# Patient Record
Sex: Male | Born: 1975 | Race: White | Hispanic: No | Smoking: Current every day smoker
Health system: Southern US, Community
[De-identification: ages and names within clinical notes are randomized; demographics above are authoritative.]

## PROBLEM LIST (undated history)

## (undated) DIAGNOSIS — F1910 Other psychoactive substance abuse, uncomplicated: Secondary | ICD-10-CM

## (undated) DIAGNOSIS — M1990 Unspecified osteoarthritis, unspecified site: Secondary | ICD-10-CM

## (undated) DIAGNOSIS — F319 Bipolar disorder, unspecified: Secondary | ICD-10-CM

## (undated) DIAGNOSIS — G894 Chronic pain syndrome: Secondary | ICD-10-CM

## (undated) DIAGNOSIS — J189 Pneumonia, unspecified organism: Secondary | ICD-10-CM

## (undated) DIAGNOSIS — T7840XA Allergy, unspecified, initial encounter: Secondary | ICD-10-CM

## (undated) DIAGNOSIS — I10 Essential (primary) hypertension: Secondary | ICD-10-CM

## (undated) DIAGNOSIS — IMO0002 Reserved for concepts with insufficient information to code with codable children: Secondary | ICD-10-CM

## (undated) DIAGNOSIS — F32A Depression, unspecified: Secondary | ICD-10-CM

## (undated) DIAGNOSIS — E785 Hyperlipidemia, unspecified: Secondary | ICD-10-CM

## (undated) DIAGNOSIS — F1120 Opioid dependence, uncomplicated: Secondary | ICD-10-CM

## (undated) DIAGNOSIS — Z765 Malingerer [conscious simulation]: Secondary | ICD-10-CM

## (undated) DIAGNOSIS — F419 Anxiety disorder, unspecified: Secondary | ICD-10-CM

## (undated) DIAGNOSIS — K219 Gastro-esophageal reflux disease without esophagitis: Secondary | ICD-10-CM

## (undated) DIAGNOSIS — F329 Major depressive disorder, single episode, unspecified: Secondary | ICD-10-CM

## (undated) DIAGNOSIS — M542 Cervicalgia: Secondary | ICD-10-CM

## (undated) DIAGNOSIS — G8929 Other chronic pain: Secondary | ICD-10-CM

## (undated) HISTORY — DX: Allergy, unspecified, initial encounter: T78.40XA

## (undated) HISTORY — DX: Reserved for concepts with insufficient information to code with codable children: IMO0002

## (undated) HISTORY — DX: Hyperlipidemia, unspecified: E78.5

## (undated) HISTORY — DX: Major depressive disorder, single episode, unspecified: F32.9

## (undated) HISTORY — DX: Chronic pain syndrome: G89.4

## (undated) HISTORY — DX: Depression, unspecified: F32.A

## (undated) HISTORY — DX: Essential (primary) hypertension: I10

## (undated) HISTORY — DX: Gastro-esophageal reflux disease without esophagitis: K21.9

---

## 1998-03-15 ENCOUNTER — Encounter: Admission: RE | Admit: 1998-03-15 | Discharge: 1998-03-15 | Payer: Self-pay | Admitting: Family Medicine

## 1998-03-16 ENCOUNTER — Encounter: Admission: RE | Admit: 1998-03-16 | Discharge: 1998-03-16 | Payer: Self-pay | Admitting: Family Medicine

## 1998-03-26 ENCOUNTER — Encounter: Admission: RE | Admit: 1998-03-26 | Discharge: 1998-03-26 | Payer: Self-pay | Admitting: Family Medicine

## 1998-05-27 ENCOUNTER — Emergency Department (HOSPITAL_COMMUNITY): Admission: EM | Admit: 1998-05-27 | Discharge: 1998-05-27 | Payer: Self-pay | Admitting: Emergency Medicine

## 1998-07-03 ENCOUNTER — Emergency Department (HOSPITAL_COMMUNITY): Admission: EM | Admit: 1998-07-03 | Discharge: 1998-07-03 | Payer: Self-pay | Admitting: Emergency Medicine

## 1998-08-28 ENCOUNTER — Emergency Department (HOSPITAL_COMMUNITY): Admission: EM | Admit: 1998-08-28 | Discharge: 1998-08-28 | Payer: Self-pay | Admitting: Emergency Medicine

## 1999-05-21 ENCOUNTER — Encounter: Payer: Self-pay | Admitting: Emergency Medicine

## 1999-05-21 ENCOUNTER — Emergency Department (HOSPITAL_COMMUNITY): Admission: EM | Admit: 1999-05-21 | Discharge: 1999-05-21 | Payer: Self-pay | Admitting: Emergency Medicine

## 1999-07-12 ENCOUNTER — Encounter: Admission: RE | Admit: 1999-07-12 | Discharge: 1999-07-12 | Payer: Self-pay | Admitting: Family Medicine

## 1999-09-09 ENCOUNTER — Encounter: Admission: RE | Admit: 1999-09-09 | Discharge: 1999-09-09 | Payer: Self-pay | Admitting: Family Medicine

## 1999-10-18 ENCOUNTER — Encounter: Admission: RE | Admit: 1999-10-18 | Discharge: 1999-10-18 | Payer: Self-pay | Admitting: Family Medicine

## 2000-05-15 ENCOUNTER — Encounter: Admission: RE | Admit: 2000-05-15 | Discharge: 2000-05-15 | Payer: Self-pay | Admitting: Family Medicine

## 2000-11-10 HISTORY — PX: HERNIA REPAIR: SHX51

## 2000-11-10 HISTORY — PX: APPENDECTOMY: SHX54

## 2001-03-08 ENCOUNTER — Encounter: Admission: RE | Admit: 2001-03-08 | Discharge: 2001-03-08 | Payer: Self-pay | Admitting: Family Medicine

## 2001-04-09 ENCOUNTER — Encounter: Admission: RE | Admit: 2001-04-09 | Discharge: 2001-04-09 | Payer: Self-pay | Admitting: Family Medicine

## 2001-06-07 ENCOUNTER — Encounter: Admission: RE | Admit: 2001-06-07 | Discharge: 2001-06-07 | Payer: Self-pay | Admitting: Family Medicine

## 2001-12-21 ENCOUNTER — Emergency Department (HOSPITAL_COMMUNITY): Admission: EM | Admit: 2001-12-21 | Discharge: 2001-12-21 | Payer: Self-pay | Admitting: Emergency Medicine

## 2001-12-22 ENCOUNTER — Encounter: Admission: RE | Admit: 2001-12-22 | Discharge: 2001-12-22 | Payer: Self-pay | Admitting: Family Medicine

## 2002-04-11 ENCOUNTER — Encounter: Admission: RE | Admit: 2002-04-11 | Discharge: 2002-04-11 | Payer: Self-pay | Admitting: *Deleted

## 2002-04-11 ENCOUNTER — Encounter: Admission: RE | Admit: 2002-04-11 | Discharge: 2002-04-11 | Payer: Self-pay | Admitting: Family Medicine

## 2002-04-11 ENCOUNTER — Encounter: Payer: Self-pay | Admitting: *Deleted

## 2002-04-12 ENCOUNTER — Emergency Department (HOSPITAL_COMMUNITY): Admission: EM | Admit: 2002-04-12 | Discharge: 2002-04-12 | Payer: Self-pay | Admitting: Emergency Medicine

## 2002-08-04 ENCOUNTER — Emergency Department (HOSPITAL_COMMUNITY): Admission: EM | Admit: 2002-08-04 | Discharge: 2002-08-04 | Payer: Self-pay | Admitting: Internal Medicine

## 2002-12-15 ENCOUNTER — Emergency Department (HOSPITAL_COMMUNITY): Admission: EM | Admit: 2002-12-15 | Discharge: 2002-12-15 | Payer: Self-pay | Admitting: Emergency Medicine

## 2002-12-25 ENCOUNTER — Emergency Department (HOSPITAL_COMMUNITY): Admission: EM | Admit: 2002-12-25 | Discharge: 2002-12-25 | Payer: Self-pay | Admitting: *Deleted

## 2003-02-09 ENCOUNTER — Encounter: Admission: RE | Admit: 2003-02-09 | Discharge: 2003-02-09 | Payer: Self-pay | Admitting: Family Medicine

## 2003-02-20 ENCOUNTER — Encounter: Admission: RE | Admit: 2003-02-20 | Discharge: 2003-02-20 | Payer: Self-pay | Admitting: Family Medicine

## 2003-03-21 ENCOUNTER — Encounter: Admission: RE | Admit: 2003-03-21 | Discharge: 2003-03-21 | Payer: Self-pay | Admitting: Sports Medicine

## 2003-03-31 ENCOUNTER — Encounter: Admission: RE | Admit: 2003-03-31 | Discharge: 2003-03-31 | Payer: Self-pay | Admitting: Family Medicine

## 2003-04-14 ENCOUNTER — Encounter: Admission: RE | Admit: 2003-04-14 | Discharge: 2003-04-14 | Payer: Self-pay | Admitting: Family Medicine

## 2003-10-09 ENCOUNTER — Emergency Department (HOSPITAL_COMMUNITY): Admission: EM | Admit: 2003-10-09 | Discharge: 2003-10-09 | Payer: Self-pay | Admitting: Emergency Medicine

## 2003-10-12 ENCOUNTER — Encounter: Admission: RE | Admit: 2003-10-12 | Discharge: 2003-10-12 | Payer: Self-pay | Admitting: Sports Medicine

## 2003-12-23 ENCOUNTER — Emergency Department (HOSPITAL_COMMUNITY): Admission: EM | Admit: 2003-12-23 | Discharge: 2003-12-23 | Payer: Self-pay | Admitting: Emergency Medicine

## 2003-12-25 ENCOUNTER — Encounter: Admission: RE | Admit: 2003-12-25 | Discharge: 2003-12-25 | Payer: Self-pay | Admitting: Family Medicine

## 2004-01-06 ENCOUNTER — Emergency Department (HOSPITAL_COMMUNITY): Admission: EM | Admit: 2004-01-06 | Discharge: 2004-01-06 | Payer: Self-pay | Admitting: Emergency Medicine

## 2004-01-08 ENCOUNTER — Encounter: Admission: RE | Admit: 2004-01-08 | Discharge: 2004-01-08 | Payer: Self-pay | Admitting: Family Medicine

## 2004-01-12 ENCOUNTER — Encounter: Admission: RE | Admit: 2004-01-12 | Discharge: 2004-01-12 | Payer: Self-pay | Admitting: Family Medicine

## 2004-01-14 ENCOUNTER — Emergency Department (HOSPITAL_COMMUNITY): Admission: EM | Admit: 2004-01-14 | Discharge: 2004-01-14 | Payer: Self-pay | Admitting: Emergency Medicine

## 2004-01-25 ENCOUNTER — Encounter (INDEPENDENT_AMBULATORY_CARE_PROVIDER_SITE_OTHER): Payer: Self-pay | Admitting: Specialist

## 2004-01-26 ENCOUNTER — Inpatient Hospital Stay (HOSPITAL_COMMUNITY): Admission: RE | Admit: 2004-01-26 | Discharge: 2004-01-27 | Payer: Self-pay | Admitting: Surgery

## 2004-01-29 ENCOUNTER — Encounter: Admission: RE | Admit: 2004-01-29 | Discharge: 2004-01-29 | Payer: Self-pay | Admitting: Family Medicine

## 2004-03-10 ENCOUNTER — Emergency Department (HOSPITAL_COMMUNITY): Admission: EM | Admit: 2004-03-10 | Discharge: 2004-03-10 | Payer: Self-pay | Admitting: Emergency Medicine

## 2004-03-14 ENCOUNTER — Encounter: Admission: RE | Admit: 2004-03-14 | Discharge: 2004-03-14 | Payer: Self-pay | Admitting: Family Medicine

## 2004-03-14 ENCOUNTER — Encounter: Admission: RE | Admit: 2004-03-14 | Discharge: 2004-03-14 | Payer: Self-pay | Admitting: Sports Medicine

## 2004-03-15 ENCOUNTER — Encounter: Admission: RE | Admit: 2004-03-15 | Discharge: 2004-03-15 | Payer: Self-pay | Admitting: Family Medicine

## 2004-05-01 ENCOUNTER — Emergency Department (HOSPITAL_COMMUNITY): Admission: EM | Admit: 2004-05-01 | Discharge: 2004-05-02 | Payer: Self-pay | Admitting: *Deleted

## 2004-05-20 ENCOUNTER — Encounter (HOSPITAL_COMMUNITY): Admission: RE | Admit: 2004-05-20 | Discharge: 2004-06-19 | Payer: Self-pay | Admitting: Orthopedic Surgery

## 2004-07-03 ENCOUNTER — Ambulatory Visit (HOSPITAL_COMMUNITY): Admission: RE | Admit: 2004-07-03 | Discharge: 2004-07-03 | Payer: Self-pay | Admitting: Orthopedic Surgery

## 2004-07-19 ENCOUNTER — Ambulatory Visit: Payer: Self-pay | Admitting: Family Medicine

## 2004-07-26 ENCOUNTER — Ambulatory Visit: Payer: Self-pay | Admitting: Family Medicine

## 2004-07-30 ENCOUNTER — Encounter: Admission: RE | Admit: 2004-07-30 | Discharge: 2004-10-28 | Payer: Self-pay | Admitting: Family Medicine

## 2004-08-02 ENCOUNTER — Ambulatory Visit: Payer: Self-pay | Admitting: Family Medicine

## 2004-08-05 ENCOUNTER — Encounter: Admission: RE | Admit: 2004-08-05 | Discharge: 2004-08-05 | Payer: Self-pay | Admitting: Family Medicine

## 2004-08-09 ENCOUNTER — Ambulatory Visit: Payer: Self-pay | Admitting: Family Medicine

## 2004-08-19 ENCOUNTER — Ambulatory Visit: Payer: Self-pay | Admitting: Family Medicine

## 2004-09-09 ENCOUNTER — Ambulatory Visit: Payer: Self-pay | Admitting: Family Medicine

## 2004-10-14 ENCOUNTER — Ambulatory Visit: Payer: Self-pay | Admitting: Family Medicine

## 2004-10-21 ENCOUNTER — Ambulatory Visit: Payer: Self-pay | Admitting: Family Medicine

## 2004-11-10 HISTORY — PX: CARDIAC CATHETERIZATION: SHX172

## 2004-12-06 ENCOUNTER — Emergency Department (HOSPITAL_COMMUNITY): Admission: EM | Admit: 2004-12-06 | Discharge: 2004-12-06 | Payer: Self-pay | Admitting: Emergency Medicine

## 2005-01-05 ENCOUNTER — Observation Stay (HOSPITAL_COMMUNITY): Admission: EM | Admit: 2005-01-05 | Discharge: 2005-01-06 | Payer: Self-pay | Admitting: Emergency Medicine

## 2005-01-06 ENCOUNTER — Encounter (INDEPENDENT_AMBULATORY_CARE_PROVIDER_SITE_OTHER): Payer: Self-pay | Admitting: Family Medicine

## 2005-01-06 ENCOUNTER — Ambulatory Visit: Payer: Self-pay | Admitting: Family Medicine

## 2005-01-08 ENCOUNTER — Emergency Department (HOSPITAL_COMMUNITY): Admission: EM | Admit: 2005-01-08 | Discharge: 2005-01-09 | Payer: Self-pay | Admitting: *Deleted

## 2005-01-31 ENCOUNTER — Ambulatory Visit (HOSPITAL_COMMUNITY): Admission: RE | Admit: 2005-01-31 | Discharge: 2005-01-31 | Payer: Self-pay | Admitting: Family Medicine

## 2005-02-04 ENCOUNTER — Encounter (HOSPITAL_COMMUNITY): Admission: RE | Admit: 2005-02-04 | Discharge: 2005-03-06 | Payer: Self-pay

## 2005-02-04 ENCOUNTER — Ambulatory Visit: Payer: Self-pay | Admitting: Internal Medicine

## 2005-02-15 ENCOUNTER — Emergency Department (HOSPITAL_COMMUNITY): Admission: EM | Admit: 2005-02-15 | Discharge: 2005-02-15 | Payer: Self-pay | Admitting: Emergency Medicine

## 2005-02-16 ENCOUNTER — Inpatient Hospital Stay (HOSPITAL_COMMUNITY): Admission: AD | Admit: 2005-02-16 | Discharge: 2005-02-23 | Payer: Self-pay | Admitting: Emergency Medicine

## 2005-02-16 ENCOUNTER — Ambulatory Visit: Payer: Self-pay | Admitting: Physical Medicine & Rehabilitation

## 2005-02-19 ENCOUNTER — Encounter: Payer: Self-pay | Admitting: Internal Medicine

## 2005-03-11 ENCOUNTER — Encounter (HOSPITAL_COMMUNITY): Admission: RE | Admit: 2005-03-11 | Discharge: 2005-04-10 | Payer: Self-pay | Admitting: *Deleted

## 2005-04-14 ENCOUNTER — Ambulatory Visit: Payer: Self-pay | Admitting: Internal Medicine

## 2005-04-30 ENCOUNTER — Encounter: Admission: RE | Admit: 2005-04-30 | Discharge: 2005-04-30 | Payer: Self-pay | Admitting: Orthopedic Surgery

## 2005-05-20 ENCOUNTER — Encounter: Admission: RE | Admit: 2005-05-20 | Discharge: 2005-05-20 | Payer: Self-pay | Admitting: Orthopedic Surgery

## 2005-05-25 ENCOUNTER — Ambulatory Visit: Admission: RE | Admit: 2005-05-25 | Discharge: 2005-05-25 | Payer: Self-pay | Admitting: Family Medicine

## 2005-05-31 ENCOUNTER — Emergency Department (HOSPITAL_COMMUNITY): Admission: EM | Admit: 2005-05-31 | Discharge: 2005-05-31 | Payer: Self-pay | Admitting: Emergency Medicine

## 2005-06-02 ENCOUNTER — Ambulatory Visit: Payer: Self-pay | Admitting: Pulmonary Disease

## 2005-06-23 ENCOUNTER — Ambulatory Visit: Payer: Self-pay | Admitting: Internal Medicine

## 2005-07-01 ENCOUNTER — Ambulatory Visit (HOSPITAL_COMMUNITY): Admission: RE | Admit: 2005-07-01 | Discharge: 2005-07-01 | Payer: Self-pay | Admitting: Internal Medicine

## 2005-08-14 ENCOUNTER — Ambulatory Visit: Payer: Self-pay | Admitting: Internal Medicine

## 2005-09-22 ENCOUNTER — Ambulatory Visit: Payer: Self-pay | Admitting: Internal Medicine

## 2005-10-21 ENCOUNTER — Ambulatory Visit: Payer: Self-pay | Admitting: Family Medicine

## 2005-11-18 ENCOUNTER — Ambulatory Visit (HOSPITAL_COMMUNITY): Admission: RE | Admit: 2005-11-18 | Discharge: 2005-11-18 | Payer: Self-pay | Admitting: Family Medicine

## 2005-11-18 ENCOUNTER — Ambulatory Visit: Payer: Self-pay | Admitting: Family Medicine

## 2005-12-23 ENCOUNTER — Encounter (INDEPENDENT_AMBULATORY_CARE_PROVIDER_SITE_OTHER): Payer: Self-pay | Admitting: Family Medicine

## 2005-12-26 ENCOUNTER — Ambulatory Visit: Payer: Self-pay | Admitting: Family Medicine

## 2005-12-26 ENCOUNTER — Ambulatory Visit (HOSPITAL_COMMUNITY): Admission: RE | Admit: 2005-12-26 | Discharge: 2005-12-26 | Payer: Self-pay | Admitting: Family Medicine

## 2006-01-19 ENCOUNTER — Ambulatory Visit: Payer: Self-pay | Admitting: Family Medicine

## 2006-02-17 ENCOUNTER — Ambulatory Visit: Payer: Self-pay | Admitting: Family Medicine

## 2006-02-18 ENCOUNTER — Encounter (INDEPENDENT_AMBULATORY_CARE_PROVIDER_SITE_OTHER): Payer: Self-pay | Admitting: Family Medicine

## 2006-02-18 LAB — CONVERTED CEMR LAB
Blood Glucose, Fasting: 100 mg/dL
RBC count: 4.35 10*6/uL

## 2006-03-03 ENCOUNTER — Ambulatory Visit: Payer: Self-pay | Admitting: Family Medicine

## 2006-03-11 ENCOUNTER — Emergency Department (HOSPITAL_COMMUNITY): Admission: EM | Admit: 2006-03-11 | Discharge: 2006-03-11 | Payer: Self-pay | Admitting: Emergency Medicine

## 2006-04-07 ENCOUNTER — Ambulatory Visit: Payer: Self-pay | Admitting: Family Medicine

## 2006-05-04 ENCOUNTER — Encounter (INDEPENDENT_AMBULATORY_CARE_PROVIDER_SITE_OTHER): Payer: Self-pay | Admitting: Family Medicine

## 2006-05-05 ENCOUNTER — Ambulatory Visit: Payer: Self-pay | Admitting: Family Medicine

## 2006-05-07 ENCOUNTER — Ambulatory Visit: Payer: Self-pay | Admitting: Internal Medicine

## 2006-05-25 ENCOUNTER — Ambulatory Visit: Payer: Self-pay | Admitting: Family Medicine

## 2006-05-26 ENCOUNTER — Ambulatory Visit (HOSPITAL_COMMUNITY): Admission: RE | Admit: 2006-05-26 | Discharge: 2006-05-26 | Payer: Self-pay | Admitting: Family Medicine

## 2006-06-09 ENCOUNTER — Ambulatory Visit: Payer: Self-pay | Admitting: Family Medicine

## 2006-06-25 ENCOUNTER — Ambulatory Visit: Payer: Self-pay | Admitting: Family Medicine

## 2006-07-24 ENCOUNTER — Ambulatory Visit: Payer: Self-pay | Admitting: Family Medicine

## 2006-08-04 ENCOUNTER — Encounter (HOSPITAL_COMMUNITY): Admission: RE | Admit: 2006-08-04 | Discharge: 2006-08-08 | Payer: Self-pay | Admitting: Family Medicine

## 2006-08-18 ENCOUNTER — Ambulatory Visit: Payer: Self-pay | Admitting: Family Medicine

## 2006-08-18 ENCOUNTER — Ambulatory Visit (HOSPITAL_COMMUNITY): Admission: RE | Admit: 2006-08-18 | Discharge: 2006-08-18 | Payer: Self-pay | Admitting: Otolaryngology

## 2006-08-25 ENCOUNTER — Encounter (HOSPITAL_COMMUNITY): Admission: RE | Admit: 2006-08-25 | Discharge: 2006-09-24 | Payer: Self-pay | Admitting: Family Medicine

## 2006-08-26 ENCOUNTER — Emergency Department (HOSPITAL_COMMUNITY): Admission: EM | Admit: 2006-08-26 | Discharge: 2006-08-26 | Payer: Self-pay | Admitting: Emergency Medicine

## 2006-09-25 ENCOUNTER — Ambulatory Visit: Payer: Self-pay | Admitting: Family Medicine

## 2006-09-30 ENCOUNTER — Encounter: Payer: Self-pay | Admitting: Family Medicine

## 2006-09-30 DIAGNOSIS — J309 Allergic rhinitis, unspecified: Secondary | ICD-10-CM | POA: Insufficient documentation

## 2006-09-30 DIAGNOSIS — K219 Gastro-esophageal reflux disease without esophagitis: Secondary | ICD-10-CM | POA: Insufficient documentation

## 2006-09-30 DIAGNOSIS — F17200 Nicotine dependence, unspecified, uncomplicated: Secondary | ICD-10-CM | POA: Insufficient documentation

## 2006-09-30 DIAGNOSIS — IMO0001 Reserved for inherently not codable concepts without codable children: Secondary | ICD-10-CM | POA: Insufficient documentation

## 2006-09-30 DIAGNOSIS — E785 Hyperlipidemia, unspecified: Secondary | ICD-10-CM | POA: Insufficient documentation

## 2006-09-30 DIAGNOSIS — M549 Dorsalgia, unspecified: Secondary | ICD-10-CM | POA: Insufficient documentation

## 2006-09-30 DIAGNOSIS — I10 Essential (primary) hypertension: Secondary | ICD-10-CM | POA: Insufficient documentation

## 2006-09-30 DIAGNOSIS — E1165 Type 2 diabetes mellitus with hyperglycemia: Secondary | ICD-10-CM

## 2006-09-30 DIAGNOSIS — M5137 Other intervertebral disc degeneration, lumbosacral region: Secondary | ICD-10-CM | POA: Insufficient documentation

## 2006-09-30 DIAGNOSIS — Z8719 Personal history of other diseases of the digestive system: Secondary | ICD-10-CM | POA: Insufficient documentation

## 2006-09-30 DIAGNOSIS — IMO0002 Reserved for concepts with insufficient information to code with codable children: Secondary | ICD-10-CM | POA: Insufficient documentation

## 2006-09-30 DIAGNOSIS — J449 Chronic obstructive pulmonary disease, unspecified: Secondary | ICD-10-CM | POA: Insufficient documentation

## 2006-10-16 ENCOUNTER — Ambulatory Visit: Payer: Self-pay | Admitting: Family Medicine

## 2006-10-21 ENCOUNTER — Ambulatory Visit: Payer: Self-pay | Admitting: Pulmonary Disease

## 2006-10-31 ENCOUNTER — Emergency Department (HOSPITAL_COMMUNITY): Admission: EM | Admit: 2006-10-31 | Discharge: 2006-10-31 | Payer: Self-pay | Admitting: Emergency Medicine

## 2006-11-10 HISTORY — PX: NASAL SINUS SURGERY: SHX719

## 2006-11-20 ENCOUNTER — Ambulatory Visit: Payer: Self-pay | Admitting: Pulmonary Disease

## 2006-11-27 ENCOUNTER — Ambulatory Visit: Payer: Self-pay | Admitting: Family Medicine

## 2006-11-27 ENCOUNTER — Ambulatory Visit (HOSPITAL_COMMUNITY): Admission: RE | Admit: 2006-11-27 | Discharge: 2006-11-27 | Payer: Self-pay | Admitting: Family Medicine

## 2006-12-11 ENCOUNTER — Ambulatory Visit: Payer: Self-pay | Admitting: Family Medicine

## 2006-12-18 ENCOUNTER — Encounter (INDEPENDENT_AMBULATORY_CARE_PROVIDER_SITE_OTHER): Payer: Self-pay | Admitting: Family Medicine

## 2006-12-21 ENCOUNTER — Telehealth (INDEPENDENT_AMBULATORY_CARE_PROVIDER_SITE_OTHER): Payer: Self-pay | Admitting: Family Medicine

## 2006-12-22 ENCOUNTER — Ambulatory Visit: Payer: Self-pay | Admitting: Family Medicine

## 2006-12-23 ENCOUNTER — Encounter (INDEPENDENT_AMBULATORY_CARE_PROVIDER_SITE_OTHER): Payer: Self-pay | Admitting: Family Medicine

## 2006-12-25 ENCOUNTER — Encounter (INDEPENDENT_AMBULATORY_CARE_PROVIDER_SITE_OTHER): Payer: Self-pay | Admitting: Family Medicine

## 2007-01-07 ENCOUNTER — Ambulatory Visit: Payer: Self-pay | Admitting: Family Medicine

## 2007-01-07 DIAGNOSIS — F1910 Other psychoactive substance abuse, uncomplicated: Secondary | ICD-10-CM | POA: Insufficient documentation

## 2007-01-07 LAB — CONVERTED CEMR LAB
Cholesterol, target level: 200 mg/dL
HDL goal, serum: 40 mg/dL

## 2007-01-08 ENCOUNTER — Encounter (INDEPENDENT_AMBULATORY_CARE_PROVIDER_SITE_OTHER): Payer: Self-pay | Admitting: Family Medicine

## 2007-01-08 ENCOUNTER — Telehealth (INDEPENDENT_AMBULATORY_CARE_PROVIDER_SITE_OTHER): Payer: Self-pay | Admitting: Family Medicine

## 2007-01-11 ENCOUNTER — Encounter (INDEPENDENT_AMBULATORY_CARE_PROVIDER_SITE_OTHER): Payer: Self-pay | Admitting: Family Medicine

## 2007-01-11 LAB — CONVERTED CEMR LAB
Amphetamine Screen, Ur: NEGATIVE
Creatinine,U: 142.4 mg/dL
Marijuana Metabolite: NEGATIVE
Methadone: NEGATIVE

## 2007-01-12 ENCOUNTER — Telehealth (INDEPENDENT_AMBULATORY_CARE_PROVIDER_SITE_OTHER): Payer: Self-pay | Admitting: Family Medicine

## 2007-01-20 ENCOUNTER — Encounter (INDEPENDENT_AMBULATORY_CARE_PROVIDER_SITE_OTHER): Payer: Self-pay | Admitting: Family Medicine

## 2007-01-26 ENCOUNTER — Telehealth (INDEPENDENT_AMBULATORY_CARE_PROVIDER_SITE_OTHER): Payer: Self-pay | Admitting: Family Medicine

## 2007-01-28 ENCOUNTER — Encounter (INDEPENDENT_AMBULATORY_CARE_PROVIDER_SITE_OTHER): Payer: Self-pay | Admitting: Family Medicine

## 2007-02-05 ENCOUNTER — Telehealth (INDEPENDENT_AMBULATORY_CARE_PROVIDER_SITE_OTHER): Payer: Self-pay | Admitting: Family Medicine

## 2007-02-08 ENCOUNTER — Encounter (INDEPENDENT_AMBULATORY_CARE_PROVIDER_SITE_OTHER): Payer: Self-pay | Admitting: Family Medicine

## 2007-02-09 ENCOUNTER — Telehealth (INDEPENDENT_AMBULATORY_CARE_PROVIDER_SITE_OTHER): Payer: Self-pay | Admitting: Family Medicine

## 2007-02-10 ENCOUNTER — Ambulatory Visit: Payer: Self-pay | Admitting: Family Medicine

## 2007-02-12 ENCOUNTER — Encounter (INDEPENDENT_AMBULATORY_CARE_PROVIDER_SITE_OTHER): Payer: Self-pay | Admitting: Family Medicine

## 2007-02-16 ENCOUNTER — Encounter (INDEPENDENT_AMBULATORY_CARE_PROVIDER_SITE_OTHER): Payer: Self-pay | Admitting: Family Medicine

## 2007-02-17 ENCOUNTER — Encounter (INDEPENDENT_AMBULATORY_CARE_PROVIDER_SITE_OTHER): Payer: Self-pay | Admitting: Family Medicine

## 2007-02-18 ENCOUNTER — Telehealth (INDEPENDENT_AMBULATORY_CARE_PROVIDER_SITE_OTHER): Payer: Self-pay | Admitting: Family Medicine

## 2007-02-19 ENCOUNTER — Ambulatory Visit: Payer: Self-pay | Admitting: Family Medicine

## 2007-02-22 ENCOUNTER — Telehealth (INDEPENDENT_AMBULATORY_CARE_PROVIDER_SITE_OTHER): Payer: Self-pay | Admitting: *Deleted

## 2007-02-23 ENCOUNTER — Encounter: Admission: RE | Admit: 2007-02-23 | Discharge: 2007-02-23 | Payer: Self-pay | Admitting: Otolaryngology

## 2007-02-23 ENCOUNTER — Encounter (INDEPENDENT_AMBULATORY_CARE_PROVIDER_SITE_OTHER): Payer: Self-pay | Admitting: Family Medicine

## 2007-02-24 ENCOUNTER — Telehealth (INDEPENDENT_AMBULATORY_CARE_PROVIDER_SITE_OTHER): Payer: Self-pay | Admitting: Family Medicine

## 2007-02-25 ENCOUNTER — Ambulatory Visit: Payer: Self-pay | Admitting: Family Medicine

## 2007-02-26 ENCOUNTER — Telehealth (INDEPENDENT_AMBULATORY_CARE_PROVIDER_SITE_OTHER): Payer: Self-pay | Admitting: Family Medicine

## 2007-02-26 ENCOUNTER — Emergency Department (HOSPITAL_COMMUNITY): Admission: EM | Admit: 2007-02-26 | Discharge: 2007-02-26 | Payer: Self-pay | Admitting: Emergency Medicine

## 2007-03-01 ENCOUNTER — Telehealth (INDEPENDENT_AMBULATORY_CARE_PROVIDER_SITE_OTHER): Payer: Self-pay | Admitting: Family Medicine

## 2007-03-03 ENCOUNTER — Ambulatory Visit: Payer: Self-pay | Admitting: Family Medicine

## 2007-03-04 LAB — CONVERTED CEMR LAB: Potassium: 4.2 meq/L (ref 3.5–5.3)

## 2007-03-05 ENCOUNTER — Encounter (INDEPENDENT_AMBULATORY_CARE_PROVIDER_SITE_OTHER): Payer: Self-pay | Admitting: Family Medicine

## 2007-03-15 ENCOUNTER — Encounter (INDEPENDENT_AMBULATORY_CARE_PROVIDER_SITE_OTHER): Payer: Self-pay | Admitting: Specialist

## 2007-03-15 ENCOUNTER — Ambulatory Visit (HOSPITAL_BASED_OUTPATIENT_CLINIC_OR_DEPARTMENT_OTHER): Admission: RE | Admit: 2007-03-15 | Discharge: 2007-03-15 | Payer: Self-pay | Admitting: Otolaryngology

## 2007-03-22 ENCOUNTER — Encounter (INDEPENDENT_AMBULATORY_CARE_PROVIDER_SITE_OTHER): Payer: Self-pay | Admitting: Family Medicine

## 2007-03-25 ENCOUNTER — Ambulatory Visit: Payer: Self-pay | Admitting: Family Medicine

## 2007-04-14 ENCOUNTER — Encounter (HOSPITAL_COMMUNITY): Admission: RE | Admit: 2007-04-14 | Discharge: 2007-05-14 | Payer: Self-pay | Admitting: Physician Assistant

## 2007-04-16 ENCOUNTER — Encounter (INDEPENDENT_AMBULATORY_CARE_PROVIDER_SITE_OTHER): Payer: Self-pay | Admitting: Family Medicine

## 2007-04-20 ENCOUNTER — Encounter (INDEPENDENT_AMBULATORY_CARE_PROVIDER_SITE_OTHER): Payer: Self-pay | Admitting: Family Medicine

## 2007-05-06 ENCOUNTER — Ambulatory Visit: Payer: Self-pay | Admitting: Family Medicine

## 2007-05-06 DIAGNOSIS — G4700 Insomnia, unspecified: Secondary | ICD-10-CM | POA: Insufficient documentation

## 2007-05-07 ENCOUNTER — Encounter (INDEPENDENT_AMBULATORY_CARE_PROVIDER_SITE_OTHER): Payer: Self-pay | Admitting: Family Medicine

## 2007-05-07 LAB — CONVERTED CEMR LAB
BUN: 13 mg/dL (ref 6–23)
Calcium: 9.5 mg/dL (ref 8.4–10.5)
Chloride: 107 meq/L (ref 96–112)
Creatinine, Ser: 1.12 mg/dL (ref 0.40–1.50)
Potassium: 4.1 meq/L (ref 3.5–5.3)

## 2007-05-10 ENCOUNTER — Encounter (INDEPENDENT_AMBULATORY_CARE_PROVIDER_SITE_OTHER): Payer: Self-pay | Admitting: Family Medicine

## 2007-05-12 ENCOUNTER — Encounter (INDEPENDENT_AMBULATORY_CARE_PROVIDER_SITE_OTHER): Payer: Self-pay | Admitting: Family Medicine

## 2007-05-13 ENCOUNTER — Encounter (INDEPENDENT_AMBULATORY_CARE_PROVIDER_SITE_OTHER): Payer: Self-pay | Admitting: Family Medicine

## 2007-05-21 ENCOUNTER — Ambulatory Visit: Payer: Self-pay | Admitting: Family Medicine

## 2007-06-01 ENCOUNTER — Telehealth (INDEPENDENT_AMBULATORY_CARE_PROVIDER_SITE_OTHER): Payer: Self-pay | Admitting: Family Medicine

## 2007-06-02 ENCOUNTER — Encounter (INDEPENDENT_AMBULATORY_CARE_PROVIDER_SITE_OTHER): Payer: Self-pay | Admitting: Family Medicine

## 2007-06-02 ENCOUNTER — Telehealth (INDEPENDENT_AMBULATORY_CARE_PROVIDER_SITE_OTHER): Payer: Self-pay | Admitting: *Deleted

## 2007-06-03 ENCOUNTER — Ambulatory Visit (HOSPITAL_COMMUNITY): Admission: RE | Admit: 2007-06-03 | Discharge: 2007-06-03 | Payer: Self-pay | Admitting: Family Medicine

## 2007-06-03 ENCOUNTER — Telehealth (INDEPENDENT_AMBULATORY_CARE_PROVIDER_SITE_OTHER): Payer: Self-pay | Admitting: *Deleted

## 2007-06-04 ENCOUNTER — Encounter (INDEPENDENT_AMBULATORY_CARE_PROVIDER_SITE_OTHER): Payer: Self-pay | Admitting: Family Medicine

## 2007-06-09 ENCOUNTER — Encounter (INDEPENDENT_AMBULATORY_CARE_PROVIDER_SITE_OTHER): Payer: Self-pay | Admitting: Family Medicine

## 2007-06-10 ENCOUNTER — Ambulatory Visit: Payer: Self-pay | Admitting: Family Medicine

## 2007-06-21 ENCOUNTER — Telehealth (INDEPENDENT_AMBULATORY_CARE_PROVIDER_SITE_OTHER): Payer: Self-pay | Admitting: Family Medicine

## 2007-06-22 ENCOUNTER — Telehealth (INDEPENDENT_AMBULATORY_CARE_PROVIDER_SITE_OTHER): Payer: Self-pay | Admitting: *Deleted

## 2007-06-23 ENCOUNTER — Telehealth (INDEPENDENT_AMBULATORY_CARE_PROVIDER_SITE_OTHER): Payer: Self-pay | Admitting: *Deleted

## 2007-06-28 ENCOUNTER — Encounter (INDEPENDENT_AMBULATORY_CARE_PROVIDER_SITE_OTHER): Payer: Self-pay | Admitting: Family Medicine

## 2007-06-29 ENCOUNTER — Encounter (HOSPITAL_COMMUNITY): Admission: RE | Admit: 2007-06-29 | Discharge: 2007-07-29 | Payer: Self-pay | Admitting: Family Medicine

## 2007-07-05 ENCOUNTER — Telehealth (INDEPENDENT_AMBULATORY_CARE_PROVIDER_SITE_OTHER): Payer: Self-pay | Admitting: Family Medicine

## 2007-07-05 ENCOUNTER — Encounter (INDEPENDENT_AMBULATORY_CARE_PROVIDER_SITE_OTHER): Payer: Self-pay | Admitting: Family Medicine

## 2007-07-07 ENCOUNTER — Telehealth (INDEPENDENT_AMBULATORY_CARE_PROVIDER_SITE_OTHER): Payer: Self-pay | Admitting: Family Medicine

## 2007-07-07 ENCOUNTER — Encounter (INDEPENDENT_AMBULATORY_CARE_PROVIDER_SITE_OTHER): Payer: Self-pay | Admitting: Family Medicine

## 2007-11-11 ENCOUNTER — Encounter: Payer: Self-pay | Admitting: Family Medicine

## 2008-01-15 ENCOUNTER — Emergency Department (HOSPITAL_COMMUNITY): Admission: EM | Admit: 2008-01-15 | Discharge: 2008-01-16 | Payer: Self-pay | Admitting: Emergency Medicine

## 2008-01-17 ENCOUNTER — Observation Stay (HOSPITAL_COMMUNITY): Admission: EM | Admit: 2008-01-17 | Discharge: 2008-01-18 | Payer: Self-pay | Admitting: Emergency Medicine

## 2008-02-01 ENCOUNTER — Ambulatory Visit (HOSPITAL_COMMUNITY): Admission: RE | Admit: 2008-02-01 | Discharge: 2008-02-01 | Payer: Self-pay | Admitting: Obstetrics and Gynecology

## 2008-02-01 ENCOUNTER — Emergency Department (HOSPITAL_COMMUNITY): Admission: EM | Admit: 2008-02-01 | Discharge: 2008-02-01 | Payer: Self-pay | Admitting: Emergency Medicine

## 2008-06-23 ENCOUNTER — Ambulatory Visit: Payer: Self-pay | Admitting: Pulmonary Disease

## 2008-08-02 ENCOUNTER — Telehealth (INDEPENDENT_AMBULATORY_CARE_PROVIDER_SITE_OTHER): Payer: Self-pay | Admitting: *Deleted

## 2009-08-28 ENCOUNTER — Ambulatory Visit: Payer: Self-pay | Admitting: Internal Medicine

## 2009-08-28 DIAGNOSIS — N529 Male erectile dysfunction, unspecified: Secondary | ICD-10-CM | POA: Insufficient documentation

## 2009-08-28 DIAGNOSIS — E291 Testicular hypofunction: Secondary | ICD-10-CM | POA: Insufficient documentation

## 2009-08-28 LAB — CONVERTED CEMR LAB
Albumin: 4.4 g/dL (ref 3.5–5.2)
Bilirubin Urine: NEGATIVE
CO2: 30 meq/L (ref 19–32)
Chloride: 102 meq/L (ref 96–112)
Creatinine,U: 91.1 mg/dL
Eosinophils Relative: 2.3 % (ref 0.0–5.0)
GFR calc non Af Amer: 102.17 mL/min (ref >60)
Glucose, Bld: 229 mg/dL — ABNORMAL HIGH (ref 70–99)
HCT: 43.3 % (ref 39.0–52.0)
Hemoglobin, Urine: NEGATIVE
Hemoglobin: 14.5 g/dL (ref 13.0–17.0)
Hgb A1c MFr Bld: 6.7 % — ABNORMAL HIGH (ref 4.6–6.5)
Ketones, ur: NEGATIVE mg/dL
LDL Cholesterol: 88 mg/dL (ref 0–99)
Leukocytes, UA: NEGATIVE
Lymphs Abs: 1.7 10*3/uL (ref 0.7–4.0)
MCHC: 33.4 g/dL (ref 30.0–36.0)
MCV: 93.8 fL (ref 78.0–100.0)
Microalb, Ur: 0.4 mg/dL (ref 0.0–1.9)
Monocytes Absolute: 0.4 10*3/uL (ref 0.1–1.0)
Monocytes Relative: 7.8 % (ref 3.0–12.0)
Neutrophils Relative %: 58.8 % (ref 43.0–77.0)
Nitrite: NEGATIVE
Sodium: 140 meq/L (ref 135–145)
TSH: 2.75 microintl units/mL (ref 0.35–5.50)
Total CHOL/HDL Ratio: 4
Urine Glucose: >=1000 mg/dL
Urobilinogen, UA: 0.2 (ref 0.0–1.0)
VLDL: 31.2 mg/dL (ref 0.0–40.0)
WBC: 5.6 10*3/uL (ref 4.5–10.5)
pH: 5.5 (ref 5.0–8.0)

## 2009-09-05 ENCOUNTER — Telehealth: Payer: Self-pay | Admitting: Internal Medicine

## 2009-09-18 ENCOUNTER — Encounter: Payer: Self-pay | Admitting: Internal Medicine

## 2009-09-19 ENCOUNTER — Encounter: Payer: Self-pay | Admitting: Internal Medicine

## 2009-10-08 ENCOUNTER — Telehealth: Payer: Self-pay | Admitting: Internal Medicine

## 2009-10-09 ENCOUNTER — Ambulatory Visit: Payer: Self-pay | Admitting: Internal Medicine

## 2009-10-17 ENCOUNTER — Telehealth: Payer: Self-pay | Admitting: Internal Medicine

## 2009-11-18 LAB — HM DIABETES EYE EXAM: HM Diabetic Eye Exam: NORMAL

## 2009-12-17 ENCOUNTER — Encounter: Payer: Self-pay | Admitting: Internal Medicine

## 2009-12-17 ENCOUNTER — Telehealth: Payer: Self-pay | Admitting: Internal Medicine

## 2010-01-03 ENCOUNTER — Ambulatory Visit: Payer: Self-pay | Admitting: Internal Medicine

## 2010-01-07 ENCOUNTER — Encounter: Payer: Self-pay | Admitting: Internal Medicine

## 2010-02-05 ENCOUNTER — Encounter: Payer: Self-pay | Admitting: Internal Medicine

## 2010-03-13 ENCOUNTER — Encounter: Payer: Self-pay | Admitting: Internal Medicine

## 2010-04-29 ENCOUNTER — Encounter: Payer: Self-pay | Admitting: Internal Medicine

## 2010-05-01 ENCOUNTER — Encounter: Payer: Self-pay | Admitting: Internal Medicine

## 2010-05-08 ENCOUNTER — Encounter: Payer: Self-pay | Admitting: Internal Medicine

## 2010-05-18 ENCOUNTER — Emergency Department (HOSPITAL_COMMUNITY): Admission: EM | Admit: 2010-05-18 | Discharge: 2010-05-18 | Payer: Self-pay | Admitting: Emergency Medicine

## 2010-05-20 ENCOUNTER — Ambulatory Visit: Payer: Self-pay | Admitting: Family

## 2010-05-20 ENCOUNTER — Ambulatory Visit: Payer: Self-pay | Admitting: Diagnostic Radiology

## 2010-05-20 ENCOUNTER — Inpatient Hospital Stay (HOSPITAL_COMMUNITY): Admission: AD | Admit: 2010-05-20 | Discharge: 2010-05-21 | Payer: Self-pay | Admitting: Internal Medicine

## 2010-05-20 ENCOUNTER — Telehealth: Payer: Self-pay | Admitting: Internal Medicine

## 2010-05-20 ENCOUNTER — Encounter: Payer: Self-pay | Admitting: Emergency Medicine

## 2010-05-22 ENCOUNTER — Telehealth: Payer: Self-pay | Admitting: Family

## 2010-05-30 ENCOUNTER — Telehealth (INDEPENDENT_AMBULATORY_CARE_PROVIDER_SITE_OTHER): Payer: Self-pay | Admitting: *Deleted

## 2010-05-31 ENCOUNTER — Ambulatory Visit (HOSPITAL_COMMUNITY): Admission: RE | Admit: 2010-05-31 | Discharge: 2010-05-31 | Payer: Self-pay | Admitting: Cardiology

## 2010-05-31 ENCOUNTER — Encounter: Payer: Self-pay | Admitting: Family

## 2010-08-09 ENCOUNTER — Ambulatory Visit: Payer: Self-pay | Admitting: Family

## 2010-08-09 DIAGNOSIS — F329 Major depressive disorder, single episode, unspecified: Secondary | ICD-10-CM | POA: Insufficient documentation

## 2010-08-09 LAB — CONVERTED CEMR LAB
Creatinine,U: 137.3 mg/dL
Marijuana Metabolite: NEGATIVE

## 2010-08-16 ENCOUNTER — Telehealth: Payer: Self-pay | Admitting: Family

## 2010-08-23 ENCOUNTER — Ambulatory Visit: Payer: Self-pay | Admitting: Family

## 2010-09-06 ENCOUNTER — Telehealth: Payer: Self-pay | Admitting: Family

## 2010-09-09 ENCOUNTER — Telehealth: Payer: Self-pay | Admitting: Family

## 2010-09-19 ENCOUNTER — Encounter: Payer: Self-pay | Admitting: Internal Medicine

## 2010-10-28 ENCOUNTER — Telehealth: Payer: Self-pay | Admitting: Family

## 2010-11-12 ENCOUNTER — Ambulatory Visit
Admission: RE | Admit: 2010-11-12 | Discharge: 2010-11-12 | Payer: Self-pay | Source: Home / Self Care | Attending: Family | Admitting: Family

## 2010-12-01 ENCOUNTER — Encounter: Payer: Self-pay | Admitting: Orthopedic Surgery

## 2010-12-10 NOTE — Letter (Signed)
Summary: [facility]   Imported By: Lester Hills and Dales 01/11/2010 07:41:37  _____________________________________________________________________  External Attachment:    Type:   Image     Comment:   External Document

## 2010-12-10 NOTE — Progress Notes (Signed)
Summary: Venlafaxine refill  Phone Note Refill Request Message from:  Fax from Pharmacy on September 09, 2010 11:57 AM  Refills Requested: Medication #1:  VENLAFAXINE HCL 37.5 MG TABS one tablet by mouth twice daily   Dosage confirmed as above?Dosage Confirmed   Brand Name Necessary? No   Supply Requested: 1 month   Last Refilled: 08/23/2010 [facility], [place] [id number], fax [phone]   Method Requested: Electronic Next Appointment Scheduled: 09/27/10 Initial call taken by: Lannette Donath,  September 09, 2010 12:00 PM  Follow-up for Phone Call        Spoke to [place] at pharmacy and he states he never received the electronic auth. for this rx on 08/23/10. Gave verbal  per 08/23/10 refill. Nicki Guadalajara Fergerson CMA (AAMA)  September 09, 2010 1:25 PM

## 2010-12-10 NOTE — Assessment & Plan Note (Signed)
Summary: STREP?  PER TRIAGE /NWS   Vital Signs:  Patient profile:   35 year old male Height:      71 inches Weight:      260 pounds BMI:     36.39 O2 Sat:      99 % on Room air Temp:     97.2 degrees F oral Pulse rate:   105 / minute BP sitting:   122 / 82  (left arm) Cuff size:   regular  Vitals Entered ByZella Ball Ewing (January 03, 2010 3:31 PM)  O2 Flow:  Room air CC: sore throat, fever, headache/RE   Primary Care Provider:  Franchot Heidelberg, MD  CC:  sore throat, fever, and headache/RE.  History of Present Illness: here with 2 days onset rapidly worsening ST, fever and slight chest congestion without headache, chills, and Pt denies CP, sob, doe, wheezing, orthopnea, pnd, worsening LE edema, palps, dizziness or syncope .  Pt denies new neuro symptoms such as headache, facial or extremity weakness   Pt denies polydipsia, polyuria, or low sugar symptoms such as shakiness improved with eating.  Overall good compliance with meds, trying to follow low chol, DM diet, wt stable, little excercise however   Problems Prior to Update: 1)  Pharyngitis-acute  (ICD-462) 2)  Foot Pain, Left  (ICD-729.5) 3)  Erectile Dysfunction, Organic  (ICD-607.84) 4)  Preventive Health Care  (ICD-V70.0) 5)  Hypogonadism  (ICD-257.2) 6)  Diabetes Mellitus, Type II  (ICD-250.00) 7)  Inadequate Sleep Hygiene  (ICD-307.49) 8)  Persistent Disorder Initiating/maintaining Sleep  (ICD-307.42) 9)  Shoulder Pain, Left  (ICD-719.41) 10)  Neck Pain, Acute  (ICD-723.1) 11)  Erectile Dysfunction  (ICD-302.72) 12)  Hypokalemia  (ICD-276.8) 13)  Preoperative Examination  (ICD-V72.84) 14)  Sinusitis, Chronic  (ICD-473.9) 15)  Hx of Substance Abuse  (ICD-305.90) 16)  Insomnia  (ICD-780.52) 17)  Fibromyalgia  (ICD-729.1) 18)  Degenerative Disc Disease, Lumbar Spine  (ICD-722.52) 19)  Tobacco Abuse  (ICD-305.1) 20)  Fatty Liver Disease, Hx of  (ICD-V12.79) 21)  Diabetes Mellitus, Type II, Controlled   (ICD-250.00) 22)  Back Pain, Chronic  (ICD-724.5) 23)  Incontinence  (ICD-788.30) 24)  Constipation Nos  (ICD-564.00) 25)  Bronchitis, Chronic  (ICD-491.9) 26)  Palpitations  (ICD-785.1) 27)  Hypertension  (ICD-401.9) 28)  Hyperlipidemia  (ICD-272.4) 29)  Gerd  (ICD-530.81) 30)  Depression  (ICD-311) 31)  Asthma  (ICD-493.90) 32)  Allergic Rhinitis  (ICD-477.9)  Medications Prior to Update: 1)  Baclofen 10 Mg Tabs (Baclofen) .... Two At Bedtime 2)  Fentanyl 75 Mcg/hr  Pt72 (Fentanyl) .... Use As Directed 3)  Lovaza 1 Gm  Caps (Omega-3-Acid Ethyl Esters) .... Take 4 Tabs By Mouth Daily 4)  Nabumetone 750 Mg  Tabs (Nabumetone) .... Take 1 Tablet By Mouth Two Times A Day 5)  Actoplus Met 15-850 Mg  Tabs (Pioglitazone Hcl-Metformin Hcl) .... Take 1 Tablet By Mouth Two Times A Day 6)  Cymbalta 60 Mg  Cpep (Duloxetine Hcl) .... Take 1 Tablet By Mouth Once A Day 7)  Lisinopril 10 Mg  Tabs (Lisinopril) .... Take 1 Tablet By Mouth Once A Day 8)  Lyrica 150 Mg  Caps (Pregabalin) .... Take 1 Tablet By Mouth Three Times A Day 9)  Glimepiride 1 Mg  Tabs (Glimepiride) .Marland Kitchen.. 1 - 2 By Mouth Two Times A Day As Needed 10)  Simcor 1000-20 Mg  Xr24h-Tab (Niacin-Simvastatin) .... Take 1 Tablet By Mouth Once A Day 11)  Trazodone Hcl 50 Mg  Tabs (Trazodone  Hcl) .... 1-2 At Bedtime As Needed. 12)  Androderm 5 Mg/24hr Pt24 (Testosterone) .Marland Kitchen.. 1 Patch Asd Once Daily 13)  Cetirizine Hcl 10 Mg Tabs (Cetirizine Hcl) .Marland Kitchen.. 1po Once Daily As Needed Allergy 14)  Alprazolam 1 Mg Tabs (Alprazolam) .Marland Kitchen.. 1 By Mouth Qid As Needed 15)  Nucynta 100 Mg Tabs (Tapentadol Hcl) .Marland Kitchen.. 1 By Mouth Qid As Needed 16)  Savella 50 Mg Tabs (Milnacipran Hcl) .Marland Kitchen.. 1 By Mouth Two Times A Day 17)  Fluticasone Propionate 50 Mcg/act Susp (Fluticasone Propionate) .... 2 Spray/side Once Daily 18)  Glucometer Strip .... Use Asd Four Times Per Day 19)  Lancets  Misc (Lancets) .... Use Asd Four Times Per Day 20)  Azithromycin 250 Mg Tabs  (Azithromycin) .... 2po Qd For 1 Day, Then 1po Qd For 4days, Then Stop  Current Medications (verified): 1)  Baclofen 10 Mg Tabs (Baclofen) .... Two At Bedtime 2)  Fentanyl 75 Mcg/hr  Pt72 (Fentanyl) .... Use As Directed 3)  Lovaza 1 Gm  Caps (Omega-3-Acid Ethyl Esters) .... Take 4 Tabs By Mouth Daily 4)  Nabumetone 750 Mg  Tabs (Nabumetone) .... Take 1 Tablet By Mouth Two Times A Day 5)  Actoplus Met 15-850 Mg  Tabs (Pioglitazone Hcl-Metformin Hcl) .... Take 1 Tablet By Mouth Two Times A Day 6)  Cymbalta 60 Mg  Cpep (Duloxetine Hcl) .... Take 1 Tablet By Mouth Once A Day 7)  Lisinopril 10 Mg  Tabs (Lisinopril) .... Take 1 Tablet By Mouth Once A Day 8)  Lyrica 150 Mg  Caps (Pregabalin) .... Take 1 Tablet By Mouth Three Times A Day 9)  Glimepiride 1 Mg  Tabs (Glimepiride) .Marland Kitchen.. 1 - 2 By Mouth Two Times A Day As Needed 10)  Simcor 1000-20 Mg  Xr24h-Tab (Niacin-Simvastatin) .... Take 1 Tablet By Mouth Once A Day 11)  Trazodone Hcl 50 Mg  Tabs (Trazodone Hcl) .Marland Kitchen.. 1-2 At Bedtime As Needed. 12)  Androderm 5 Mg/24hr Pt24 (Testosterone) .Marland Kitchen.. 1 Patch Asd Once Daily 13)  Cetirizine Hcl 10 Mg Tabs (Cetirizine Hcl) .Marland Kitchen.. 1po Once Daily As Needed Allergy 14)  Alprazolam 1 Mg Tabs (Alprazolam) .Marland Kitchen.. 1 By Mouth Qid As Needed 15)  Nucynta 100 Mg Tabs (Tapentadol Hcl) .Marland Kitchen.. 1 By Mouth Qid As Needed 16)  Savella 50 Mg Tabs (Milnacipran Hcl) .Marland Kitchen.. 1 By Mouth Two Times A Day 17)  Fluticasone Propionate 50 Mcg/act Susp (Fluticasone Propionate) .... 2 Spray/side Once Daily 18)  Glucometer Strip .... Use Asd Four Times Per Day 19)  Lancets  Misc (Lancets) .... Use Asd Four Times Per Day 20)  Cephalexin 500 Mg Caps (Cephalexin) .Marland Kitchen.. 1 By Mouth Three Times A Day 21)  Tessalon Perles 100 Mg Caps (Benzonatate) .Marland Kitchen.. 1 - 2 By Mouth Three Times A Day As Needed Cough  Allergies (verified): 1)  ! Cipro (Ciprofloxacin) 2)  ! Vicodin (Hydrocodone-Acetaminophen)  Past History:  Past Medical History: Last updated:  08/28/2009 Allergic rhinitis Asthma Depression GERD Hyperlipidemia Hypertension Diabetes mellitus, type II chronic pain syndrome - sees [facility] (no MS) urinary incontinence - detrusor instability hypogonadism  Past Surgical History: Last updated: 03/25/2007 Appendectomy hernia repair SInus surgery 2008  Social History: Last updated: 08/28/2009 Married Former Smoker Alcohol use-no Drug use- cocaine - quit 2003 - normal drug screen in March 08 Disabled - Multiple problems, chornic pain   Risk Factors: Smoking Status: quit (01/07/2007) Packs/Day: 2 (09/30/2006)  Review of Systems       all otherwise negative per pt -  Physical Exam  General:  alert and overweight-appearing.  , mild ill  Head:  normocephalic and atraumatic.   Eyes:  vision grossly intact, pupils equal, and pupils round.   Ears:  bilat tm's mild red, sinus nontender Nose:  nasal dischargemucosal pallor and mucosal edema.   Mouth:  pharyngeal erythema, fair dentition, and pharyngeal exudate.   Neck:  supple and cervical lymphadenopathy.   Lungs:  normal respiratory effort and normal breath sounds.   Heart:  normal rate and regular rhythm.   Extremities:  no edema, no erythema    Impression & Recommendations:  Problem # 1:  PHARYNGITIS-ACUTE (ICD-462)  His updated medication list for this problem includes:    Nabumetone 750 Mg Tabs (Nabumetone) .Marland Kitchen... Take 1 tablet by mouth two times a day    Cephalexin 500 Mg Caps (Cephalexin) .Marland Kitchen... 1 by mouth three times a day treat as above, f/u any worsening signs or symptoms   Problem # 2:  DIABETES MELLITUS, TYPE II (ICD-250.00)  His updated medication list for this problem includes:    Actoplus Met 15-850 Mg Tabs (Pioglitazone hcl-metformin hcl) .Marland Kitchen... Take 1 tablet by mouth two times a day    Lisinopril 10 Mg Tabs (Lisinopril) .Marland Kitchen... Take 1 tablet by mouth once a day    Glimepiride 1 Mg Tabs (Glimepiride) .Marland Kitchen... 1 - 2 by mouth two times a  day as needed  Labs Reviewed: Creat: 0.9 (08/28/2009)    Reviewed HgBA1c results: 6.7 (08/28/2009)  5.8 (05/04/2006) stable overall by hx and exam, ok to continue meds/tx as is , Pt to cont DM diet, excercise, wt loss efforts;  Problem # 3:  HYPERTENSION (ICD-401.9)  His updated medication list for this problem includes:    Lisinopril 10 Mg Tabs (Lisinopril) .Marland Kitchen... Take 1 tablet by mouth once a day  BP today: 122/82 Prior BP: 112/80 (10/09/2009)  Prior 10 Yr Risk Heart Disease: Not enough information (01/07/2007)  Labs Reviewed: K+: 4.6 (08/28/2009) Creat: : 0.9 (08/28/2009)   Chol: 162 (08/28/2009)   HDL: 42.70 (08/28/2009)   LDL: 88 (08/28/2009)   TG: 156.0 (08/28/2009) stable overall by hx and exam, ok to continue meds/tx as is   Complete Medication List: 1)  Baclofen 10 Mg Tabs (Baclofen) .... Two at bedtime 2)  Fentanyl 75 Mcg/hr Pt72 (Fentanyl) .... Use as directed 3)  Lovaza 1 Gm Caps (Omega-3-acid ethyl esters) .... Take 4 tabs by mouth daily 4)  Nabumetone 750 Mg Tabs (Nabumetone) .... Take 1 tablet by mouth two times a day 5)  Actoplus Met 15-850 Mg Tabs (Pioglitazone hcl-metformin hcl) .... Take 1 tablet by mouth two times a day 6)  Cymbalta 60 Mg Cpep (Duloxetine hcl) .... Take 1 tablet by mouth once a day 7)  Lisinopril 10 Mg Tabs (Lisinopril) .... Take 1 tablet by mouth once a day 8)  Lyrica 150 Mg Caps (Pregabalin) .... Take 1 tablet by mouth three times a day 9)  Glimepiride 1 Mg Tabs (Glimepiride) .Marland Kitchen.. 1 - 2 by mouth two times a day as needed 10)  Simcor 1000-20 Mg Xr24h-tab (Niacin-simvastatin) .... Take 1 tablet by mouth once a day 11)  Trazodone Hcl 50 Mg Tabs (Trazodone hcl) .Marland Kitchen.. 1-2 at bedtime as needed. 12)  Androderm 5 Mg/24hr Pt24 (Testosterone) .Marland Kitchen.. 1 patch asd once daily 13)  Cetirizine Hcl 10 Mg Tabs (Cetirizine hcl) .Marland Kitchen.. 1po once daily as needed allergy 14)  Alprazolam 1 Mg Tabs (Alprazolam) .Marland Kitchen.. 1 by mouth qid as needed 15)  Nucynta 100 Mg  Tabs  (Tapentadol hcl) .Marland Kitchen.. 1 by mouth qid as needed 16)  Savella 50 Mg Tabs (Milnacipran hcl) .Marland Kitchen.. 1 by mouth two times a day 17)  Fluticasone Propionate 50 Mcg/act Susp (Fluticasone propionate) .... 2 spray/side once daily 18)  Glucometer Strip  .... Use asd four times per day 19)  Lancets Misc (Lancets) .... Use asd four times per day 20)  Cephalexin 500 Mg Caps (Cephalexin) .Marland Kitchen.. 1 by mouth three times a day 21)  Tessalon Perles 100 Mg Caps (Benzonatate) .Marland Kitchen.. 1 - 2 by mouth three times a day as needed cough  Patient Instructions: 1)  Please take all new medications as prescribed 2)  Continue all previous medications as before this visit  3)  Please schedule a follow-up appointment in 2 months with: 4)  BMP prior to visit, ICD-9: 250.02 5)  Lipid Panel prior to visit, ICD-9: 6)  HbgA1C prior to visit, ICD-9: Prescriptions: TESSALON PERLES 100 MG CAPS (BENZONATATE) 1 - 2 by mouth three times a day as needed cough  #50 x 1   Entered and Authorized by:   Corwin Levins MD   Signed by:   Corwin Levins MD on 01/03/2010   Method used:   Print then Give to Patient   RxID:   [id number] CEPHALEXIN 500 MG CAPS (CEPHALEXIN) 1 by mouth three times a day  #30 x 0   Entered and Authorized by:   Corwin Levins MD   Signed by:   Corwin Levins MD on 01/03/2010   Method used:   Print then Give to Patient   RxID:   [id number]

## 2010-12-10 NOTE — Cardiovascular Report (Signed)
Summary: [facility]  [facility]   Imported By: Lanelle Bal 09/02/2010 10:45:47  _____________________________________________________________________  External Attachment:    Type:   Image     Comment:   External Document

## 2010-12-10 NOTE — Progress Notes (Signed)
Summary: requests chantix rx  Phone Note Call from Patient Call back at [phone] or [phone] (cell)   Caller: Patient Call For: Lemont Fillers FNP Summary of Call: Pt discharged from hospital yesterday.  Pt has been referred to [facility] (05-31-10) for consultation for possible stress test.  Pt is requesting rx for Chantix starter pack.  Please advise.  Nicki Guadalajara Fergerson CMA Duncan Dull)  May 22, 2010 9:40 AM   Follow-up for Phone Call        Chantix sent to pharmacy. Please have patient follow up in 2 weeks for OV (post-hospital) Follow-up by: Lemont Fillers FNP,  May 22, 2010 10:10 AM  Additional Follow-up for Phone Call Additional follow up Details #1::        Pt notified Rx sent to pharmacy. Scheduled hosp f/u for 06/11/10 @ 8am.  Mervin Kung CMA (AAMA)  May 22, 2010 11:07 AM     New/Updated Medications: CHANTIX STARTING MONTH PAK 0.5 MG X 11 & 1 MG X 42 TABS (VARENICLINE TARTRATE) take as directed Prescriptions: CHANTIX STARTING MONTH PAK 0.5 MG X 11 & 1 MG X 42 TABS (VARENICLINE TARTRATE) take as directed  #1 x 0   Entered and Authorized by:   Lemont Fillers FNP   Signed by:   Lemont Fillers FNP on 05/22/2010   Method used:   Electronically to        [facility]* (retail)       [address] [place], [place]  [address]       Ph: [phone]       Fax: [phone]   RxID:   [id number]

## 2010-12-10 NOTE — Letter (Signed)
Summary: [facility]   Imported By: Sherian Rein 02/09/2010 08:45:18  _____________________________________________________________________  External Attachment:    Type:   Image     Comment:   External Document

## 2010-12-10 NOTE — Progress Notes (Signed)
Summary: Referral?  Phone Note Call from Patient Call back at Home Phone [phone]   Caller: Patient [phone] Summary of Call: pt called stating that her was told by Pain MD that he should call PCP and request a muscle biopsy. pt also says that Pain MD was not sure if his office should set up referral or PCP. please advise Initial call taken by: Margaret Pyle, CMA,  December 17, 2009 11:14 AM  Follow-up for Phone Call        ok for pain md to do this, as this was the result of his evaluation Follow-up by: Corwin Levins MD,  December 17, 2009 1:03 PM  Additional Follow-up for Phone Call Additional follow up Details #1::        left message on voicemail to call back to office. Additional Follow-up by: Lucious Groves,  December 17, 2009 1:51 PM    Additional Follow-up for Phone Call Additional follow up Details #2::    Pt returned call please call him at [phone] Follow-up by: Verdell Face,  December 17, 2009 2:28 PM  Additional Follow-up for Phone Call Additional follow up Details #3:: Details for Additional Follow-up Action Taken: Patient notified. Additional Follow-up by: Lucious Groves,  December 17, 2009 3:49 PM

## 2010-12-10 NOTE — Progress Notes (Signed)
  Phone Note Other Incoming   Request: Send information Summary of Call: Request for records received from [employer] [employer]. Request forwarded to [employer].     

## 2010-12-10 NOTE — Assessment & Plan Note (Signed)
Summary: ANKLE & LEGS SWELLING/SEEN IN ER ON SATURDAY/HEA--Rm 5   Vital Signs:  Patient profile:   35 year old male Height:      71 inches Weight:      251.25 pounds BMI:     35.17 O2 Sat:      100 % on Room air Temp:     97.5 degrees F oral Pulse rate:   107 / minute Pulse rhythm:   regular Resp:     16 per minute BP sitting:   118 / 80  (right arm) Cuff size:   large  Vitals Entered By: Mervin Kung CMA (AAMA) (May 20, 2010 3:01 PM)  O2 Flow:  Room air CC: Room 5  Follow up from ED visit ([facility]) this weekend for bilateral feet and leg swelling. Feels short of breath today and diaphoretic. Has noticed some chest tightness since starting Lasix. Is Patient Diabetic? Yes   Primary Care Provider:  Franchot Heidelberg, MD  CC:  Room 5  Follow up from ED visit () this weekend for bilateral feet and leg swelling. Feels short of breath today and diaphoretic. Has noticed some chest tightness since starting Lasix.Marland Kitchen  History of Present Illness: [name] is a 35 year old male who presents today in follow up from his visit to the [facility] ED for peripheral edema.  He was treated with lasix.  He had normal BNP and normal cardiac enzymes in ED.  EKG showed sinus tachycardia and chest x-ray was negative.  Since starting lasix he notes that he has some associated chest tightess and shortness of breath- notes that he sometimes has some chest pressure and at times can't take a deep breath.  SOB is worse with exertion.  Patient denies orthopnea. Denies chest pain with exertion.  Notes that chest pressure is more intense with exertion.  Non-radiating.  Allergies: 1)  ! Cipro (Ciprofloxacin) 2)  ! Vicodin (Hydrocodone-Acetaminophen)  Past History:  Past Medical History: Last updated: 08/28/2009 Allergic rhinitis Asthma Depression GERD Hyperlipidemia Hypertension Diabetes mellitus, type II chronic pain syndrome - sees [facility] (no MS) urinary incontinence -  detrusor instability hypogonadism  Past Surgical History: Last updated: 03/25/2007 Appendectomy hernia repair SInus surgery 2008  Family History: Last updated: 05/20/2010 Father: 50 OA Mother: 49 DM, Cirrhosis, HTN Siblings: Sister 53  - DM borderline  maternal Grandfather- MI in his late 67's Paternal GM- lung CA died 22 Paternal GF-died of heart attack 60 paternal uncle died at 69 with MI  Social History: Last updated: 08/28/2009 Married Former Smoker Alcohol use-no Drug use- cocaine - quit 2003 - normal drug screen in March 08 Disabled - Multiple problems, chornic pain   Risk Factors: Smoking Status: quit (01/07/2007) Packs/Day: 2 (09/30/2006)  Family History: Reviewed history from 01/07/2007 and no changes required. Father: 4 OA Mother: 38 DM, Cirrhosis, HTN Siblings: Sister 72  - DM borderline  maternal Grandfather- MI in his late 44's Paternal GM- lung CA died 70 Paternal GF-died of heart attack 36 paternal uncle died at 93 with MI  Social History: Reviewed history from 08/28/2009 and no changes required. Married Former Smoker Alcohol use-no Drug use- cocaine - quit 2003 - normal drug screen in March 08 Disabled - Multiple problems, chornic pain   Review of Systems       see HPI  Physical Exam  General:  Well-developed,well-nourished,in no acute distress; alert,appropriate and cooperative throughout examination Head:  Normocephalic and atraumatic without obvious abnormalities. No apparent alopecia or balding.  Lungs:  Normal respiratory effort, chest expands symmetrically. Lungs are clear to auscultation, no crackles or wheezes. Heart:  Normal rate and regular rhythm. S1 and S2 normal without gallop, murmur, click, rub or other extra sounds. Abdomen:  Bowel sounds positive,abdomen soft and non-tender without masses, organomegaly or hernias noted. Extremities:  1+ left pedal edema and 1+ right pedal edema.     Impression &  Recommendations:  Problem # 1:  CHEST PAIN (ICD-786.50) Assessment New 35 year old male with chest pressure, EKG performed today in the office notes V3 and V4 T waves are more peaked as compared to EKG performed 05/18/10.  Multiple risk factors for CAD include DM, Hyperlipidemia, HTN, smoker, and family history of premature CAD.  Also with SOB and recent edema,  needs evaluation for rule out MI, 2-D echo and probably stress test.  Spoke with Dr. Allena Katz, ([facility]) he recommended that the patient be evaluated through the ED rather than a direct admit to the floor.  Will send patient downstairs to the ED.     Problem # 2:  EDEMA (ICD-782.3) Assessment: New Edema is improved since patient started lasix.  However, he notes worsening chest tightness after taking lasix.  May need alternative diuretic.   Complete Medication List: 1)  Baclofen 10 Mg Tabs (Baclofen) .... Two at bedtime 2)  Lovaza 1 Gm Caps (Omega-3-acid ethyl esters) .... Take 4 tabs by mouth daily 3)  Nabumetone 750 Mg Tabs (Nabumetone) .... Take 1 tablet by mouth two times a day 4)  Actoplus Met 15-850 Mg Tabs (Pioglitazone hcl-metformin hcl) .... Take 1 tablet by mouth two times a day 5)  Lyrica 150 Mg Caps (Pregabalin) .... Take 1 tablet by mouth two times a day 6)  Glimepiride 1 Mg Tabs (Glimepiride) .Marland Kitchen.. 1 - 2 by mouth two times a day as needed 7)  Simcor 1000-20 Mg Xr24h-tab (Niacin-simvastatin) .... Take 1 tablet by mouth once a day 8)  Trazodone Hcl 50 Mg Tabs (Trazodone hcl) .... 1/2 at bedtime as needed. 9)  Cetirizine Hcl 10 Mg Tabs (Cetirizine hcl) .Marland Kitchen.. 1po once daily as needed allergy 10)  Nucynta 100 Mg Tabs (Tapentadol hcl) .Marland Kitchen.. 1 by mouth qid as needed 11)  Savella 50 Mg Tabs (Milnacipran hcl) .Marland Kitchen.. 1 by mouth two times a day 12)  Fluticasone Propionate 50 Mcg/act Susp (Fluticasone propionate) .... 2 spray/side once daily 13)  Glucometer Strip  .... Use asd four times per day 14)  Lancets Misc (Lancets) .... Use  asd four times per day 15)  Lantus 100 Unit/ml Soln (Insulin glargine) .... 24 untis at bedtime. 16)  Aspir-low 81 Mg Tbec (Aspirin) .... Take 1 tablet by mouth once a day 17)  Daily Multi Tabs (Multiple vitamins-minerals) .... Take 1 tablet by mouth once a day 18)  Clonazepam 2 Mg Tabs (Clonazepam) .... Take 1 tablet by mouth two times a day 19)  Omnaris 50 Mcg/act Susp (Ciclesonide) .... Two sprays each nostril at bedtime. 20)  Astepro 0.15 % Soln (Azelastine hcl) .... Two sprays each nostril every day.  Patient Instructions: 1)  Please present downstairs to the ED for evaluation.  Current Allergies (reviewed today): ! CIPRO (CIPROFLOXACIN) ! VICODIN (HYDROCODONE-ACETAMINOPHEN)

## 2010-12-10 NOTE — Progress Notes (Signed)
Summary: Venlafaxine concern  Phone Note Call from Patient Call back at [phone] cell. Ok to leave msg. on voicemail.   Caller: Spouse Call For: Lemont Fillers FNP Summary of Call: Pt sleeping all day since starting Venlafaxine. Seems to be worse than before he started the med.  Please advise. Nicki Guadalajara Fergerson CMA Duncan Dull)  August 16, 2010 11:41 AM   Follow-up for Phone Call        Called patient back (wife answered) She tells me she was the one who had called originally.  She tells me that the patient appears more drowsy since starting venlafexine.  Sleeping even more since starting the meds.  Recommended that patient cut down to a once a day dosing until he folllows up with me on 10/14.  Wife will relay plan to husband. Follow-up by: Lemont Fillers FNP,  August 16, 2010 11:59 AM

## 2010-12-10 NOTE — Medication Information (Signed)
Summary: Diabetic supplies/[facility]  Diabetic supplies/[facility]   Imported By: Lester Leonard 12/25/2009 10:55:46  _____________________________________________________________________  External Attachment:    Type:   Image     Comment:   External Document

## 2010-12-10 NOTE — Letter (Signed)
Summary: [facility]  [facility]   Imported By: [name] Odor 09/24/2010 13:26:01  _____________________________________________________________________  External Attachment:    Type:   Image     Comment:   External Document

## 2010-12-10 NOTE — Letter (Signed)
Summary: [facility]   Imported By: Sherian Rein 03/15/2010 14:06:57  _____________________________________________________________________  External Attachment:    Type:   Image     Comment:   External Document

## 2010-12-10 NOTE — Assessment & Plan Note (Signed)
Summary: DEPRESSED/DT [facility] WITH WIFE/MHF--Rm 5   Vital Signs:  Patient profile:   35 year old male Height:      71 inches Weight:      243.50 pounds BMI:     34.08 O2 Sat:      99 % on Room air Temp:     98.3 degrees F oral Pulse rate:   125 / minute Pulse rhythm:   regular Resp:     18 per minute BP sitting:   120 / 96  (right arm) Cuff size:   large  Vitals Entered By: Mervin Kung CMA (AAMA) (August 09, 2010 3:21 PM)  O2 Flow:  Room air CC: rm 5  Pt here to discuss depression., Depression Is Patient Diabetic? Yes Pain Assessment Patient in pain? no        Primary Care Provider:  Lemont Fillers FNP  CC:  rm 5  Pt here to discuss depression. and Depression.  History of Present Illness: [name] is a 35 year old male who presents today with his wife to discuss depression.  Patient reports that he was working part time and was led to believe that this position would lead to a full time position.  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  He was let go when that employee returned from his surgery.  That was approximately 1 month ago.  Since that time he has only managed to leave the house twice.  Stays in bed "all the time."  Not eating as usual.  Has lost 8 pounds since his last visit.  He denies illicit drug use.    Depression History:      The patient is having a depressed mood most of the day and has a diminished interest in his usual daily activities.  Positive alarm features for depression include significant weight loss, hypersomnia, psychomotor retardation, and fatigue (loss of energy).  However, he denies recurrent thoughts of death or suicide.        Psychosocial stress factors include major life changes.  The patient denies that he has thought about ending his life.        Comments:  + anorexia, .   Allergies: 1)  ! Cipro (Ciprofloxacin) 2)  ! Vicodin (Hydrocodone-Acetaminophen)  Physical  Exam  General:  Tired appearing male, awake, alert, NAD Eyes:  Very dilated pupils bilaterally. Lungs:  Normal respiratory effort, chest expands symmetrically. Lungs are clear to auscultation, no crackles or wheezes. Heart:  S1,S2 regular, + tachycardia.  Psych:  flat affect and subdued.  Well groomed.  Speech is slow.  Appears depressed.  Not tearful during exam.    Impression & Recommendations:  Problem # 1:  DEPRESSION (ICD-311) Assessment New Will give patient a trial of venlafaxine.  I have asked the patient to follow up in 2 weeks.  30 minutes were spent with patient.  Greater than 50% of this time was spent counseling the patient on his depression.  Plan to treat with Venlafaxine 37.5 two times a day to start.  I have clarified this with [facility] and cancelled original XR dose that was sent to [facility].  His updated medication list for this problem includes:    Trazodone Hcl 50 Mg Tabs (Trazodone hcl) .Marland Kitchen... 1/2 at bedtime as needed.    Clonazepam 2 Mg Tabs (Clonazepam) .Marland Kitchen... Take 1 tablet by mouth two times a day    Venlafaxine Hcl 37.5 Mg Tabs (  Venlafaxine hcl) ..... One tablet by mouth twice daily  Problem # 2:  TACHYCARDIA (ICD-785.0) EKG performed here in the office notes sinus rythm with rate of 112.  Patient is noted to have very dilated pupils today along with his tachycardia.  Will plan to check urine drug toxicology.  Pt denies use of illicit drugs.    Complete Medication List: 1)  Baclofen 20 Mg Tabs (Baclofen) .... One tablet by mouth at bedtime 2)  Lovaza 1 Gm Caps (Omega-3-acid ethyl esters) .... Take 4 tabs by mouth daily 3)  Nabumetone 750 Mg Tabs (Nabumetone) .... Take 1 tablet by mouth two times a day 4)  Lyrica 100 Mg Caps (Pregabalin) .... 3 tablet by mouth twice daily 5)  Glimepiride 1 Mg Tabs (Glimepiride) .... 2 tabs by mouth once daily in the morning 6)  Simcor 1000-20 Mg Xr24h-tab (Niacin-simvastatin) .... Take 1 tablet by mouth once a day 7)   Trazodone Hcl 50 Mg Tabs (Trazodone hcl) .... 1/2 at bedtime as needed. 8)  Cetirizine Hcl 10 Mg Tabs (Cetirizine hcl) .Marland Kitchen.. 1po once daily as needed allergy 9)  Nucynta 100 Mg Tabs (Tapentadol hcl) .Marland Kitchen.. 1 by mouth qid as needed 10)  Savella 50 Mg Tabs (Milnacipran hcl) .Marland Kitchen.. 1 by mouth two times a day 11)  Glucometer Strip  .... Use asd four times per day 12)  Lancets Misc (Lancets) .... Use asd four times per day 13)  Lantus 100 Unit/ml Soln (Insulin glargine) .... 30 units subcutaneously  at bedtime. 14)  Aspir-low 81 Mg Tbec (Aspirin) .... Take 1 tablet by mouth once a day 15)  Daily Multi Tabs (Multiple vitamins-minerals) .... Take 1 tablet by mouth once a day 16)  Clonazepam 2 Mg Tabs (Clonazepam) .... Take 1 tablet by mouth two times a day 17)  Omnaris 50 Mcg/act Susp (Ciclesonide) .... Two sprays each nostril at bedtime. 18)  Astepro 0.15 % Soln (Azelastine hcl) .... Two sprays each nostril every day. 19)  Metformin Hcl 1000 Mg Tabs (Metformin hcl) .... Take 1 tablet by mouth two times a day. 20)  Omnaris 50 Mcg/act Susp (Ciclesonide) .... 2 sprays at bedtime. 21)  Humalog 100 Unit/ml Soln (Insulin lispro (human)) .... 5 units before meals 22)  Venlafaxine Hcl 37.5 Mg Tabs (Venlafaxine hcl) .... One tablet by mouth twice daily 23)  Cialis 5 Mg Tabs (Tadalafil) .... One tablet by mouth daily  Other Orders: T-Drug Screen-Urine, ea (mullti) [id number])  Patient Instructions: 1)  Please schedule a follow-up appointment in 2 weeks. 2)  Go to the ER if you develop suicidal thoughts. 3)  Call us if your symptoms worsen. Prescriptions: VENLAFAXINE HCL 37.5 MG TABS (VENLAFAXINE HCL) one tablet by mouth twice daily  #60 x 1   Entered and Authorized by:   Lemont Fillers FNP   Signed by:   Lemont Fillers FNP on 08/09/2010   Method used:   Electronically to        [facility] (retail)       [address]       [place], [place]  [address]        Ph: [phone]       Fax: [phone]   RxID:   [id number] CIALIS 5 MG TABS (TADALAFIL) one tablet by mouth daily  #30 x 3   Entered and Authorized by:   Lemont Fillers FNP   Signed by:   Katrinka Blazing  Peggyann Juba FNP on 08/09/2010   Method used:   Electronically to        [facility] (retail)       [address], [place]  [address]       Ph: [phone]       Fax: [phone]   RxID:   [id number] EFFEXOR XR 37.5 MG XR24H-CAP (VENLAFAXINE HCL) one tablet by mouth two times a day  #60 x 0   Entered and Authorized by:   Lemont Fillers FNP   Signed by:   Lemont Fillers FNP on 08/09/2010   Method used:   Electronically to        [facility]* (retail)       [address] [place], [place]  [address]       Ph: [phone]       Fax: [phone]   RxID:   [id number]  Pt requested Rxs be sent to [facility] in [place]. Rxs called as above to [facility]. Spoke to [employer] @ [facility] and cancelled above rxs. Nicki Guadalajara Fergerson CMA [place])  August 09, 2010 4:32 PM   Current Allergies (reviewed today): ! CIPRO (CIPROFLOXACIN) ! VICODIN (HYDROCODONE-ACETAMINOPHEN)

## 2010-12-10 NOTE — Progress Notes (Signed)
Summary: Call Report  Phone Note Other Incoming   Caller: [facility] Call Report Summary of Call: Triage Call Report Triage Record Num: [id number] Operator: Karenann Cai Patient Name: [name] Call Date & Time: 05/18/2010 3:57:15PM Patient Phone: [phone] PCP: Oliver Barre Patient Gender: Male PCP Fax : [phone] Patient DOB: 1976/06/23 Practice Name: [facility] Reason for Call: Wife/[name] is calling to report that patient has edema of ankles>edema is progressing up patient's calf (both sides). Afebrile. Wife reports ankles are "swollen over his shoes". RN advised Wife to have patient evaluated at [facility] today and follow up with PCP on Monday. Protocol(s) Used: Edema, Generalized Atraumatic Recommended Outcome per Protocol: See Provider within 4 hours Reason for Outcome: Edema newly worse or developed in last 12 hours Care Advice:  ~ List, or take, all current prescription(s), OTC or alternative medication(s) to provider for evaluation. 07/ Initial call taken by: Margaret Pyle, CMA,  May 20, 2010 8:10 AM  Follow-up for Phone Call        noted  nancy - pt due for OV Follow-up by: Corwin Levins MD,  May 20, 2010 12:50 PM  Additional Follow-up for Phone Call Additional follow up Details #1::        THIS PT HAS AN APPT AS A NEW PT WITH MELISSA O'SULLIVAN TODAY AT 3:15 IN [place]. Additional Follow-up by: Hilarie Fredrickson,  May 20, 2010 1:58 PM    Additional Follow-up for Phone Call Additional follow up Details #2::    noted Follow-up by: Corwin Levins MD,  May 20, 2010 2:46 PM

## 2010-12-10 NOTE — Assessment & Plan Note (Signed)
Summary: 2 week fu/dt--Rm 4   Vital Signs:  Patient profile:   35 year old male Height:      71 inches Weight:      245.25 pounds BMI:     34.33 Temp:     97.7 degrees F oral Pulse rate:   126 / minute Pulse rhythm:   regular Resp:     18 per minute BP sitting:   140 / 80  (right arm) Cuff size:   large  Vitals Entered By: Mervin Kung CMA [place]) (August 23, 2010 3:55 PM)  CC: Rm 4  2 week f/u, Depression Is Patient Diabetic? No Comments Pt agrees all med doses and directions are correct. Nicki Guadalajara Fergerson CMA [place])  August 23, 2010 4:00 PM    Primary Care Lamaj Metoyer:  Lemont Fillers FNP  CC:  Rm 4  2 week f/u and Depression.  History of Present Illness: Mr. [name] is a 35 year old male who presents today for follow up of his depression. He is accompanied by his wife and daughter. He was started on Effexor approximately 2 weeks ago.  Initially, his wife noted that he had become increasingly somnolent.  Pt is not splitting the dose into a two times a day schedule and notes that he is better able to tolerate the medication.  Initially had some nausea the first few days, but this has resolved.  Pt is feeling better.  Taking Effexor two times a day. He has gone out of the house 3 days in a row which is something that he has not done until recently due to his depression.    Depression History:      The patient denies insomnia.        Comments:  appetite is better, denies suicide ideation.   Allergies: 1)  ! Cipro (Ciprofloxacin) 2)  ! Vicodin (Hydrocodone-Acetaminophen)  Past History:  Past Medical History: Last updated: 08/28/2009 Allergic rhinitis Asthma Depression GERD Hyperlipidemia Hypertension Diabetes mellitus, type II chronic pain syndrome - sees [facility] (no MS) urinary incontinence - detrusor instability hypogonadism  Physical Exam  General:  Well-developed,well-nourished,in no acute distress; alert,appropriate and cooperative  throughout examination Head:  Normocephalic and atraumatic without obvious abnormalities. No apparent alopecia or balding. Psych:  Oriented X3, memory intact for recent and remote, and not anxious appearing.  Patient is more interactive and more alert.  Smiling and joking with family in the room.   Diabetes Management Exam:    Foot Exam by Podiatrist:       Date: 10/24/2009       Results: no diabetic findings    Eye Exam:       Eye Exam done elsewhere          Date: 11/28/2009          Results: normal          Done by: Dr. Bur[place]   Impression & Recommendations:  Problem # 1:  DEPRESSION (ICD-311) Assessment Improved Symptoms are significantly improved today.  Will continue current dosing of venlafaxine.  Patient to follow up in 1 month.  15 minutes were spent with patient. Greater than 50% of this time was spent counseling patient on his depression. His updated medication list for this problem includes:    Trazodone Hcl 50 Mg Tabs (Trazodone hcl) .Marland Kitchen... 1/2 at bedtime as needed.    Clonazepam 2 Mg Tabs (Clonazepam) .Marland Kitchen... Take 1 tablet by mouth two times a day    Venlafaxine Hcl 37.5 Mg  Tabs (Venlafaxine hcl) ..... One tablet by mouth twice daily  Complete Medication List: 1)  Baclofen 20 Mg Tabs (Baclofen) .... One tablet by mouth at bedtime 2)  Lovaza 1 Gm Caps (Omega-3-acid ethyl esters) .... Take 4 tabs by mouth daily 3)  Nabumetone 750 Mg Tabs (Nabumetone) .... Take 1 tablet by mouth two times a day 4)  Lyrica 100 Mg Caps (Pregabalin) .... One tablet by mouth three times a day 5)  Glimepiride 1 Mg Tabs (Glimepiride) .... 2 tabs by mouth once daily in the morning 6)  Simcor 1000-20 Mg Xr24h-tab (Niacin-simvastatin) .... Take 1 tablet by mouth once a day 7)  Trazodone Hcl 50 Mg Tabs (Trazodone hcl) .... 1/2 at bedtime as needed. 8)  Cetirizine Hcl 10 Mg Tabs (Cetirizine hcl) .Marland Kitchen.. 1po once daily as needed allergy 9)  Nucynta 100 Mg Tabs (Tapentadol hcl) .Marland Kitchen.. 1 by mouth qid as  needed 10)  Savella 50 Mg Tabs (Milnacipran hcl) .Marland Kitchen.. 1 by mouth two times a day 11)  Glucometer Strip  .... Use asd four times per day 12)  Lancets Misc (Lancets) .... Use asd four times per day 13)  Lantus 100 Unit/ml Soln (Insulin glargine) .... 30 units subcutaneously  at bedtime. 14)  Aspir-low 81 Mg Tbec (Aspirin) .... Take 1 tablet by mouth once a day 15)  Daily Multi Tabs (Multiple vitamins-minerals) .... Take 1 tablet by mouth once a day 16)  Clonazepam 2 Mg Tabs (Clonazepam) .... Take 1 tablet by mouth two times a day 17)  Omnaris 50 Mcg/act Susp (Ciclesonide) .... Two sprays each nostril at bedtime. 18)  Astepro 0.15 % Soln (Azelastine hcl) .... Two sprays each nostril every day. 19)  Metformin Hcl 1000 Mg Tabs (Metformin hcl) .... Take 1 tablet by mouth two times a day. 20)  Omnaris 50 Mcg/act Susp (Ciclesonide) .... 2 sprays at bedtime. 21)  Humalog 100 Unit/ml Soln (Insulin lispro (human)) .... 5 units before meals 22)  Venlafaxine Hcl 37.5 Mg Tabs (Venlafaxine hcl) .... One tablet by mouth twice daily 23)  Cialis 5 Mg Tabs (Tadalafil) .... One tablet by mouth daily  Patient Instructions: 1)  Try to keep a low sodium diet.  2)  Follow up in 1 month.   Prescriptions: VENLAFAXINE HCL 37.5 MG TABS (VENLAFAXINE HCL) one tablet by mouth twice daily  #60 x 2   Entered and Authorized by:   Lemont Fillers FNP   Signed by:   Lemont Fillers FNP on 08/23/2010   Method used:   Electronically to        [facility] (retail)       [address], [place]  [address]       Ph: [phone]       Fax: [phone]   RxID:   [id number]     Immunization History:  Pneumovax Immunization History:    Pneumovax:  historical (07/23/2005)   Immunization History:  Pneumovax Immunization History:    Pneumovax:  Historical (07/23/2005)  Current Allergies (reviewed today): ! CIPRO (CIPROFLOXACIN) ! VICODIN  (HYDROCODONE-ACETAMINOPHEN)

## 2010-12-10 NOTE — Progress Notes (Signed)
Summary: refill-- Simcor, Lovaza  Phone Note Refill Request Message from:  Fax from Pharmacy on September 06, 2010 1:03 PM  Refills Requested: Medication #1:  SIMCOR 1000-20 MG  XR24H-TAB Take 1 tablet by mouth once a day   Dosage confirmed as above?Dosage Confirmed   Brand Name Necessary? No   Supply Requested: 1 month   Last Refilled: 08/09/2010  Medication #2:  LOVAZA 1 GM  CAPS take 4 tabs by mouth daily   Dosage confirmed as above?Dosage Confirmed   Brand Name Necessary? No   Supply Requested: 1 month  Method Requested: Electronic Next Appointment Scheduled: 09-27-2010 [facility]  Initial call taken by: Roselle Locus,  September 06, 2010 1:04 PM    Prescriptions: SIMCOR 1000-20 MG  XR24H-TAB (NIACIN-SIMVASTATIN) Take 1 tablet by mouth once a day  #30 x 2   Entered by:   Mervin Kung CMA (AAMA)   Authorized by:   Lemont Fillers FNP   Signed by:   Mervin Kung CMA (AAMA) on 09/06/2010   Method used:   Electronically to        [facility] (retail)       [address] [place], [place]  [address]       Ph: [phone]       Fax: [phone]   RxID:   [id number] LOVAZA 1 GM  CAPS (OMEGA-3-ACID ETHYL ESTERS) take 4 tabs by mouth daily  #120 x 2   Entered by:   Mervin Kung CMA (AAMA)   Authorized by:   Lemont Fillers FNP   Signed by:   Mervin Kung CMA (AAMA) on 09/06/2010   Method used:   Electronically to        [facility]* (retail)       [address] [place], [place]  [address]       Ph: [phone]       Fax: [phone]   RxID:   [id number]

## 2010-12-10 NOTE — Letter (Signed)
Summary: Historic Patient File  Historic Patient File   Imported By: Lind Guest 08/16/2010 16:25:35  _____________________________________________________________________  External Attachment:    Type:   Image     Comment:   External Document

## 2010-12-10 NOTE — Letter (Signed)
Summary: [facility]   Imported By: Lester Petronila 05/15/2010 09:30:06  _____________________________________________________________________  External Attachment:    Type:   Image     Comment:   External Document

## 2010-12-12 NOTE — Progress Notes (Signed)
Summary: chantix request  Phone Note Refill Request Message from:  wife, [name] on October 28, 2010 4:36 PM  Refills Requested: Medication #1:  Chantix [name] wife called requesting Rx of Chantix for pt. She states that pt was taken off of Effexor as the doctor told him it was interfering with his pain medications.  Please advise.   Next Appointment Scheduled: none Initial call taken by: Mervin Kung CMA [place]),  October 28, 2010 4:36 PM  Follow-up for Phone Call        I am not comfortable giving him chantix with his history of depression.  I would recommend that he use the nicotine patch. Follow-up by: Lemont Fillers FNP,  October 28, 2010 4:51 PM  Additional Follow-up for Phone Call Additional follow up Details #1::        Left message on machine to return my call. Nicki Guadalajara Fergerson CMA [place])  October 29, 2010 9:34 AM   [name] wife [name] returned my call and left message to call her as she has additional question. Attempted to reach pt and received voicemail. Left message to return my call. Nicki Guadalajara Fergerson CMA [place])  October 29, 2010 10:53 AM     Additional Follow-up for Phone Call Additional follow up Details #2::    Left detailed message on [phone] re: Amarri Satterly's instruction and to call if they have any questions. Nicki Guadalajara Fergerson CMA [place])  October 29, 2010 2:07 PM   [name] returned my call and states that pt has tried Nicotine patches before and they did not help. Chantix did help. Is there anything else pt can try?  Nicki Guadalajara Fergerson CMA [place])  October 29, 2010 3:46 PM   Additional Follow-up for Phone Call Additional follow up Details #3:: Details for Additional Follow-up Action Taken: May be able to try zyban, however I would need to see him in the office for evaluation since his depression meds have been changed by another provider.  Lemont Fillers FNP,  October 29, 2010 4:04 PM  Left detailed message on voicemail re: Griff Badley's instructions and to call  and schedule an appt if he wishes to proceed with additional med for smoking cessation. Nicki Guadalajara Fergerson CMA [place])  October 29, 2010 4:16 PM   call placed to patient at [phone], he has been advised per [facility] instructions. He states that his wife has an appointment with Ling Flesch on Friday, and he will try to schedule. He states that he will call back to schedule as he wa sin the middle of something. Glendell Docker CMA  October 30, 2010 3:38 PM

## 2010-12-12 NOTE — Assessment & Plan Note (Signed)
Summary: PER Shamya Macfadden/M[facility]--rm 5   Vital Signs:  Patient profile:   35 year old male Height:      71 inches Weight:      246 pounds BMI:     34.43 Temp:     97.3 degrees F oral Pulse rate:   108 / minute Pulse rhythm:   regular Resp:     18 per minute BP sitting:   118 / 88  (right arm) Cuff size:   large  Vitals Entered By: Mervin Kung CMA Duncan Dull) (November 12, 2010 11:05 AM) CC: Pt here for follow up. Wants something to help stop smoking., Depression Is Patient Diabetic? Yes Pain Assessment Patient in pain? no      Comments Pt states Dr Lucianne Muss stopped Glimepiried due to low BS. Venlafaxine was stopped because it interfered with his Savella. States he has not been having depression. Nicki Guadalajara Fergerson CMA Duncan Dull)  November 12, 2010 11:12 AM    Primary Care Provider:  Lemont Fillers FNP  CC:  Pt here for follow up. Wants something to help stop smoking. and Depression.  History of Present Illness: Mr. Boeve is a 35 year old male who presents today to discuss smoking cessation.  1) Tobacco abuse-  Pt reports that he is smoking close to 2 packs a day of cigarettes.  Has tried chantix in the past- most recently last summer.  He quit for 3 months with the use of chantix.  He denied that he had any associated worsening of his depression while on this medication.  Has tried patches/gum without success.    2) Depression-  felt disoriented on the Effexor.  Tapered off of the Effexor.  Last dose was in November.  Feels well- see assessment below.   3) Low testosterone- Pt noted that his testosterone level was low.   4) DM- stopped glimepiride due to hypoglycemia per Dr. Lucianne Muss.  Since that time fasting sugars are running 115-129.  Post prandial- 140 or less.     Depression History:      The patient denies significant weight loss, significant weight gain, insomnia, hypersomnia, fatigue (loss of energy), impaired concentration (indecisiveness), and recurrent thoughts of death or  suicide.  The patient denies symptoms of a manic disorder including excessive buying sprees, excessive sexual indiscretions, and excessive foolish business investments.        Comments:  Denies visual or auditory hallucinations.  .   Allergies: 1)  ! Cipro (Ciprofloxacin) 2)  ! Vicodin (Hydrocodone-Acetaminophen)  Past History:  Past Medical History: Last updated: 08/28/2009 Allergic rhinitis Asthma Depression GERD Hyperlipidemia Hypertension Diabetes mellitus, type II chronic pain syndrome - sees [facility] (no MS) urinary incontinence - detrusor instability hypogonadism  Past Surgical History: Last updated: 03/25/2007 Appendectomy hernia repair SInus surgery 2008  Physical Exam  General:  Well-developed,well-nourished,in no acute distress; alert,appropriate and cooperative throughout examination Head:  Normocephalic and atraumatic without obvious abnormalities. No apparent alopecia or balding. Neck:  No deformities, masses, or tenderness noted. Psych:  Cognition and judgment appear intact. Alert and cooperative with normal attention span and concentration. No apparent delusions, illusions, hallucinations   Impression & Recommendations:  Problem # 1:  TOBACCO ABUSE (ICD-305.1) Assessment Deteriorated  Wants to quit smoking.  Patient was counseled on smoking cessation x 5 minutes.  Will initiate chantix.  Pt instructed to call if any worsening signs of depression.   His updated medication list for this problem includes:    Chantix Starting Month Pak 0.5 Mg  X 11 & 1 Mg X 42 Tabs (Varenicline tartrate) ..... Start 0.5mg  by mouth daily x 3 days, then 0.5 mg twice daily x 4 days, then 1mg  twice daily for 11 weeks.  stop smoking after 7 days.    Chantix Continuing Month Pak 1 Mg Tabs (Varenicline tartrate) ..... One tablet by mouth twice daily for two additional months  Orders: Tobacco use cessation intermediate 3-10 minutes (16109)  Problem # 2:   HYPOGONADISM (ICD-257.2) Assessment: Comment Only This is being managed by Urology.   Problem # 3:  DEPRESSION (ICD-311) Assessment: Improved Currently only taking savella for his FM.  Feels that since his testosterone issues have been addressed his mood and energy are improved.  Monitor.  The following medications were removed from the medication list:    Venlafaxine Hcl 37.5 Mg Tabs (Venlafaxine hcl) ..... One tablet by mouth twice daily His updated medication list for this problem includes:    Trazodone Hcl 50 Mg Tabs (Trazodone hcl) .Marland Kitchen... 1/2 at bedtime as needed.    Clonazepam 2 Mg Tabs (Clonazepam) .Marland Kitchen... Take 1 tablet by mouth two times a day  Complete Medication List: 1)  Baclofen 20 Mg Tabs (Baclofen) .... One tablet by mouth at bedtime 2)  Lovaza 1 Gm Caps (Omega-3-acid ethyl esters) .... Take 4 tabs by mouth daily 3)  Nabumetone 750 Mg Tabs (Nabumetone) .... Take 1 tablet by mouth two times a day 4)  Lyrica 100 Mg Caps (Pregabalin) .... One tablet by mouth three times a day 5)  Simcor 1000-20 Mg Xr24h-tab (Niacin-simvastatin) .... Take 1 tablet by mouth once a day 6)  Trazodone Hcl 50 Mg Tabs (Trazodone hcl) .... 1/2 at bedtime as needed. 7)  Cetirizine Hcl 10 Mg Tabs (Cetirizine hcl) .Marland Kitchen.. 1po once daily as needed allergy 8)  Nucynta 100 Mg Tabs (Tapentadol hcl) .Marland Kitchen.. 1 by mouth qid as needed 9)  Savella 50 Mg Tabs (Milnacipran hcl) .Marland Kitchen.. 1 by mouth two times a day 10)  Glucometer Strip  .... Use asd four times per day 11)  Lancets Misc (Lancets) .... Use asd four times per day 12)  Lantus 100 Unit/ml Soln (Insulin glargine) .... 30 units subcutaneously  at bedtime. 13)  Aspir-low 81 Mg Tbec (Aspirin) .... Take 1 tablet by mouth once a day 14)  Daily Multi Tabs (Multiple vitamins-minerals) .... Take 1 tablet by mouth once a day 15)  Clonazepam 2 Mg Tabs (Clonazepam) .... Take 1 tablet by mouth two times a day 16)  Omnaris 50 Mcg/act Susp (Ciclesonide) .... Two sprays each nostril  at bedtime. 17)  Astepro 0.15 % Soln (Azelastine hcl) .... Two sprays each nostril every day. 18)  Metformin Hcl 1000 Mg Tabs (Metformin hcl) .... Take 1 tablet by mouth two times a day. 19)  Omnaris 50 Mcg/act Susp (Ciclesonide) .... 2 sprays at bedtime. 20)  Humalog 100 Unit/ml Soln (Insulin lispro (human)) .... 5 units before meals 21)  Cialis 5 Mg Tabs (Tadalafil) .... One tablet by mouth daily 22)  Chantix Starting Month Pak 0.5 Mg X 11 & 1 Mg X 42 Tabs (Varenicline tartrate) .... Start 0.5mg  by mouth daily x 3 days, then 0.5 mg twice daily x 4 days, then 1mg  twice daily for 11 weeks.  stop smoking after 7 days. 23)  Chantix Continuing Month Pak 1 Mg Tabs (Varenicline tartrate) .... One tablet by mouth twice daily for two additional months 24)  Amoxicillin 500 Mg Caps (Amoxicillin) .... 2 caps by mouth three times a day  for 10 days 25)  Implantable Testosterone Seed Pellets  .... Changed every 4 months by urology  Patient Instructions: 1)  Stop Chantix immediately and call if you develop recurrent depression while on Chantix. 2)  Go to ER if you develop suicidal thoughts. 3)   Call if you develop fever over 101, increasing sinus pressure, pain with eye movement, increased facial tenderness of swelling, or if you develop visual changes. 4)  Follow up in 3 months. Prescriptions: AMOXICILLIN 500 MG CAPS (AMOXICILLIN) 2 caps by mouth three times a day for 10 days  #60 x 0   Entered and Authorized by:   Lemont Fillers FNP   Signed by:   Lemont Fillers FNP on 11/12/2010   Method used:   Electronically to        [facility] (retail)       [address]       [place], [place]  [address]       Ph: [phone]       Fax: [phone]   RxID:   [id number] AMOXICILLIN 500 MG CAPS (AMOXICILLIN) 2 caps by mouth three times a day for 10 days  #60 x 0   Entered and Authorized by:   Lemont Fillers FNP   Signed by:   Lemont Fillers  FNP on 11/12/2010   Method used:   Print then Give to Patient   RxID:   [id number] CHANTIX CONTINUING MONTH PAK 1 MG TABS (VARENICLINE TARTRATE) one tablet by mouth twice daily for two additional months  #1 x 1   Entered and Authorized by:   Lemont Fillers FNP   Signed by:   Lemont Fillers FNP on 11/12/2010   Method used:   Print then Give to Patient   RxID:   [id number] CHANTIX STARTING MONTH PAK 0.5 MG X 11 & 1 MG X 42 TABS (VARENICLINE TARTRATE) start 0.5mg  by mouth daily x 3 days, then 0.5 mg twice daily x 4 days, then 1mg  twice daily for 11 weeks.  Stop smoking after 7 days.  #1 x 0   Entered and Authorized by:   Lemont Fillers FNP   Signed by:   Lemont Fillers FNP on 11/12/2010   Method used:   Electronically to        [facility].* (retail)       [address]       [place], [place]  [address]       Ph: [phone]       Fax: [phone]   RxID:   [id number]    Orders Added: 1)  Est. Patient Level III [94854] 2)  Tobacco use cessation intermediate 3-10 minutes [99406]    Current Allergies (reviewed today): ! CIPRO (CIPROFLOXACIN) ! VICODIN (HYDROCODONE-ACETAMINOPHEN)

## 2010-12-31 ENCOUNTER — Telehealth: Payer: Self-pay | Admitting: Family

## 2011-01-07 NOTE — Progress Notes (Signed)
Summary: sore throat  Phone Note Call from Patient Call back at Home Phone [phone]   Caller: Spouse--[name] Call For: Timothy Fillers FNP Summary of Call: Received message from pt's wife stating that pt has swollen and sore throat. Thinks it looks like his daughter's did when she had strep. Wants to know if we can call in an antibiotic? Initial call taken by: Mervin Kung CMA Duncan Dull),  December 31, 2010 4:57 PM  Follow-up for Phone Call        I would like to see him in the office please so that we can officially screen him for strep. Follow-up by: Timothy Fillers FNP,  December 31, 2010 5:03 PM  Additional Follow-up for Phone Call Additional follow up Details #1::        Left detailed message on home phone per [facility] instruction and to call the office for appointment. Additional Follow-up by: Mervin Kung CMA (AAMA),  January 01, 2011 8:30 AM

## 2011-01-24 ENCOUNTER — Ambulatory Visit: Payer: Self-pay | Admitting: Family

## 2011-01-25 LAB — GLUCOSE, CAPILLARY
Glucose-Capillary: 128 mg/dL — ABNORMAL HIGH (ref 70–99)
Glucose-Capillary: 95 mg/dL (ref 70–99)

## 2011-01-25 LAB — POCT I-STAT 3, VENOUS BLOOD GAS (G3P V)
Acid-base deficit: 3 mmol/L — ABNORMAL HIGH (ref 0.0–2.0)
Bicarbonate: 23.5 mEq/L (ref 20.0–24.0)
O2 Saturation: 71 %
pH, Ven: 7.299 (ref 7.250–7.300)
pO2, Ven: 41 mmHg (ref 30.0–45.0)

## 2011-01-25 LAB — POCT I-STAT 3, ART BLOOD GAS (G3+)
O2 Saturation: 99 %
pCO2 arterial: 42.7 mmHg (ref 35.0–45.0)
pH, Arterial: 7.378 (ref 7.350–7.450)
pO2, Arterial: 126 mmHg — ABNORMAL HIGH (ref 80.0–100.0)

## 2011-01-26 LAB — BASIC METABOLIC PANEL
Calcium: 9.3 mg/dL (ref 8.4–10.5)
Creatinine, Ser: 0.9 mg/dL (ref 0.4–1.5)
GFR calc Af Amer: >60 mL/min (ref >60)
GFR calc non Af Amer: >60 mL/min (ref >60)
Sodium: 139 mEq/L (ref 135–145)

## 2011-01-26 LAB — CBC
Hemoglobin: 12.5 g/dL — ABNORMAL LOW (ref 13.0–17.0)
MCH: 32.3 pg (ref 26.0–34.0)
MCHC: 34.2 g/dL (ref 30.0–36.0)
MCV: 94.5 fL (ref 78.0–100.0)
Platelets: 160 10*3/uL (ref 150–400)
Platelets: 172 10*3/uL (ref 150–400)
RBC: 3.98 MIL/uL — ABNORMAL LOW (ref 4.22–5.81)
RDW: 13.4 % (ref 11.5–15.5)
WBC: 3.8 10*3/uL — ABNORMAL LOW (ref 4.0–10.5)
WBC: 6 10*3/uL (ref 4.0–10.5)

## 2011-01-26 LAB — DIFFERENTIAL
Eosinophils Absolute: 0.2 10*3/uL (ref 0.0–0.7)
Lymphocytes Relative: 45 % (ref 12–46)
Lymphs Abs: 1.6 10*3/uL (ref 0.7–4.0)
Monocytes Absolute: 0.6 10*3/uL (ref 0.1–1.0)
Monocytes Relative: 15 % — ABNORMAL HIGH (ref 3–12)
Monocytes Relative: 13 % — ABNORMAL HIGH (ref 3–12)
Neutro Abs: 1.3 10*3/uL — ABNORMAL LOW (ref 1.7–7.7)
Neutro Abs: 3.3 10*3/uL (ref 1.7–7.7)
Neutrophils Relative %: 55 % (ref 43–77)

## 2011-01-26 LAB — GLUCOSE, CAPILLARY
Glucose-Capillary: 105 mg/dL — ABNORMAL HIGH (ref 70–99)
Glucose-Capillary: 124 mg/dL — ABNORMAL HIGH (ref 70–99)
Glucose-Capillary: 79 mg/dL (ref 70–99)

## 2011-01-26 LAB — HEPARIN LEVEL (UNFRACTIONATED): Heparin Unfractionated: 0.72 IU/mL — ABNORMAL HIGH (ref 0.30–0.70)

## 2011-01-26 LAB — BLOOD GAS, ARTERIAL
Bicarbonate: 23.4 mEq/L (ref 20.0–24.0)
Patient temperature: 98.6
TCO2: 24.6 mmol/L (ref 0–100)
pCO2 arterial: 38.5 mmHg (ref 35.0–45.0)
pH, Arterial: 7.4 (ref 7.350–7.450)
pO2, Arterial: 72.5 mmHg — ABNORMAL LOW (ref 80.0–100.0)

## 2011-01-26 LAB — POCT CARDIAC MARKERS
CKMB, poc: <1 ng/mL — ABNORMAL LOW (ref 1.0–8.0)
CKMB, poc: <1 ng/mL — ABNORMAL LOW (ref 1.0–8.0)
Myoglobin, poc: 42.9 ng/mL (ref 12–200)
Troponin i, poc: <0.05 ng/mL (ref 0.00–0.09)

## 2011-01-26 LAB — CARDIAC PANEL(CRET KIN+CKTOT+MB+TROPI)
Relative Index: 0.5 (ref 0.0–2.5)
Troponin I: <0.01 ng/mL (ref 0.00–0.06)

## 2011-01-26 LAB — LIPID PANEL
HDL: 41 mg/dL (ref >39)
LDL Cholesterol: 48 mg/dL (ref 0–99)
Triglycerides: 72 mg/dL (ref <150)

## 2011-01-26 LAB — COMPREHENSIVE METABOLIC PANEL
Albumin: 3.6 g/dL (ref 3.5–5.2)
BUN: 3 mg/dL — ABNORMAL LOW (ref 6–23)
Calcium: 8.9 mg/dL (ref 8.4–10.5)
Creatinine, Ser: 0.87 mg/dL (ref 0.4–1.5)
Total Protein: 6.1 g/dL (ref 6.0–8.3)

## 2011-01-26 LAB — URINALYSIS, ROUTINE W REFLEX MICROSCOPIC
Glucose, UA: NEGATIVE mg/dL
Hgb urine dipstick: NEGATIVE
Protein, ur: NEGATIVE mg/dL
Specific Gravity, Urine: 1.004 — ABNORMAL LOW (ref 1.005–1.030)
Urobilinogen, UA: 0.2 mg/dL (ref 0.0–1.0)

## 2011-01-26 LAB — RAPID URINE DRUG SCREEN, HOSP PERFORMED
Barbiturates: NOT DETECTED
Cocaine: NOT DETECTED
Opiates: NOT DETECTED
Tetrahydrocannabinol: NOT DETECTED

## 2011-01-26 LAB — HEMOGLOBIN A1C: Hgb A1c MFr Bld: 5.8 % — ABNORMAL HIGH (ref <5.7)

## 2011-01-26 LAB — POCT B-TYPE NATRIURETIC PEPTIDE (BNP): B Natriuretic Peptide, POC: 7.1 pg/mL (ref 0–100)

## 2011-01-31 ENCOUNTER — Encounter: Payer: Self-pay | Admitting: Family

## 2011-01-31 ENCOUNTER — Ambulatory Visit (INDEPENDENT_AMBULATORY_CARE_PROVIDER_SITE_OTHER): Payer: No Typology Code available for payment source | Admitting: Family

## 2011-01-31 DIAGNOSIS — F3289 Other specified depressive episodes: Secondary | ICD-10-CM

## 2011-01-31 DIAGNOSIS — F32A Depression, unspecified: Secondary | ICD-10-CM

## 2011-01-31 DIAGNOSIS — F411 Generalized anxiety disorder: Secondary | ICD-10-CM

## 2011-01-31 DIAGNOSIS — F329 Major depressive disorder, single episode, unspecified: Secondary | ICD-10-CM

## 2011-01-31 DIAGNOSIS — F419 Anxiety disorder, unspecified: Secondary | ICD-10-CM

## 2011-01-31 MED ORDER — DULOXETINE HCL 30 MG PO CPEP: ORAL_CAPSULE | ORAL | Status: DC

## 2011-01-31 NOTE — Patient Instructions (Signed)
Stop Savella and chantix. Start Cymbalta 30mg  once daily for one week. On week two increase to 2 tabs (60mg ) once daily.  Follow up in 1 month. Call if you depression/anxiety symptoms worsen, or do not improve. Go to the ER if you develop suicidal thoughts.

## 2011-01-31 NOTE — Progress Notes (Signed)
Subjective:    Patient ID: [name], male    DOB: 1976-04-04, 35 y.o.   MRN: [id number]  HPI DM2- Being monitored by Dr. Lucianne Muss.  Notes that his sugars have been elevated in the 200's.  He notes that he has an upcoming apt with Dr. Lucianne Muss.    Anxiety-  Symptoms started about 2 months ago.  He has been in his bedroom for the last month.  Won't even go into the living room.  Denies somnolence. Using benzo- "used to help but it stopped working."  Depression-  Doesn't want to talk to anyone on the phone or answer the door.  Wife says he "can't get the words out." He has not seen psychiatry.  He denies suicide ideation.  Pt has not left his bed room for the last month.    Past Medical History  Diagnosis Date  . Allergy   . Asthma   . Depression   . GERD (gastroesophageal reflux disease)   . Hypertension   . Diabetes mellitus     Type II  . Hyperlipidemia   . Chronic pain disorder     Sees [facility]  . Urinary incontinence     detrusor instability  . Hypogonadism     History   Social History  . Marital Status: Married    Spouse Name: N/A    Number of Children: 2  . Years of Education: N/A   Occupational History  . Disabled     chronic pain   Social History Main Topics  . Smoking status: Current Some Day Smoker -- 0.3 packs/day    Types: Cigarettes  . Smokeless tobacco: Never Used  . Alcohol Use: No  . Drug Use: No     stopped cocaine--2003. Normal drug screen March 2008  . Sexually Active: Not on file   Other Topics Concern  . Not on file   Social History Narrative  . No narrative on file    Past Surgical History  Procedure Date  . Appendectomy   . Hernia repair   . Nasal sinus surgery 2008    Family History  Problem Relation Age of Onset  . Diabetes Mother   . Hypertension Mother   . Cirrhosis Mother   . Arthritis Father 49    osteoarthritis  . Diabetes Sister     borderline  . Heart disease Paternal Uncle 9    MI  . Heart  disease Maternal Grandfather     late 60's--MI  . Cancer Paternal Grandmother 82    lung  . Heart disease Paternal Grandfather 39    MI    Allergies  Allergen Reactions  . Ciprofloxacin     REACTION: Rash and itching  . Hydrocodone-Acetaminophen     REACTION: Rash and itching    Current Outpatient Prescriptions on File Prior to Visit  Medication Sig Dispense Refill  . aspirin 81 MG tablet Take 81 mg by mouth daily.        . baclofen (LIORESAL) 20 MG tablet Take 20 mg by mouth at bedtime.        . cetirizine (ZYRTEC) 10 MG tablet Take 10 mg by mouth daily.        . ciclesonide (OMNARIS) 50 MCG/ACT nasal spray 2 sprays by Each Nare route at bedtime as needed.        . clonazePAM (KLONOPIN) 2 MG tablet Take 2 mg by mouth 2 (two) times daily as needed.        Marland Kitchen  insulin glargine (LANTUS) 100 UNIT/ML injection Inject 30 Units into the skin at bedtime.        . insulin lispro (HUMALOG) 100 UNIT/ML injection Inject 5 Units into the skin 3 (three) times daily before meals.        Demetra [employer] (AUTO-LANCET) MISC Check blood sugar 4 times daily.       . metFORMIN (GLUCOPHAGE) 1000 MG tablet Take 1,000 mg by mouth 2 (two) times daily with meals.        . Multiple Vitamins-Minerals (MULTIVITAMIN WITH MINERALS) tablet Take 1 tablet by mouth daily.        . nabumetone (RELAFEN) 750 MG tablet Take 750 mg by mouth 2 (two) times daily.        . niacin-simvastatin (SIMCOR) 1000-20 MG 24 hr tablet Take 1 tablet by mouth at bedtime.        Marland Kitchen omega-3 acid ethyl esters (LOVAZA) 1 GM capsule Take 4 tablets by mouth daily.       . pregabalin (LYRICA) 100 MG capsule Take 100 mg by mouth 3 (three) times daily.        . tadalafil (CIALIS) 5 MG tablet Take 5 mg by mouth daily as needed.        . Tapentadol HCl (NUCYNTA) 100 MG TABS Take 1 tablet by mouth 4 (four) times daily as needed.        . traZODone (DESYREL) 50 MG tablet Take 1/2 tablet by mouth at bedtime.         BP 112/88  Pulse 106  Temp(Src)  98 F (36.7 C) (Oral)  Resp 18  Ht 5\' 11"  (1.803 m)  Wt 238 lb 0.6 oz (107.974 kg)  BMI 33.20 kg/m2     Review of Systems    see HPI Objective:   Physical Exam   Gen: White male, awake, alert, NAD Psych:  Speech is somewhat slowed.  Calm, but became tearful during interview.  Hygiene is good. Mildly anxious appearing.   Assessment & Plan:

## 2011-02-01 DIAGNOSIS — F419 Anxiety disorder, unspecified: Secondary | ICD-10-CM | POA: Insufficient documentation

## 2011-02-01 NOTE — Assessment & Plan Note (Addendum)
35 year old male with worsening depression and severe anxiety about leaving the home.  Long discussion with the patient in regards to his FM treatment and his Depression.  His depression and anxiety have previously been stable on Cymbalta.  His pain management Dr. Riley Churches him to [facility]  Will plan to d/c Savella and place him back on Cymbalta.  25 minutes spent with the patient today.  Greater than 50% of this time was spent counseling the patient on his anxiety and depression.  Also- pt instructed to discontinue chantix.

## 2011-02-21 ENCOUNTER — Ambulatory Visit: Payer: Self-pay | Admitting: Family

## 2011-03-05 ENCOUNTER — Ambulatory Visit: Payer: No Typology Code available for payment source | Admitting: Family

## 2011-03-14 ENCOUNTER — Ambulatory Visit: Payer: No Typology Code available for payment source | Admitting: Family

## 2011-03-18 ENCOUNTER — Encounter: Payer: Self-pay | Admitting: Family

## 2011-03-18 ENCOUNTER — Ambulatory Visit (INDEPENDENT_AMBULATORY_CARE_PROVIDER_SITE_OTHER): Payer: No Typology Code available for payment source | Admitting: Family

## 2011-03-18 DIAGNOSIS — F3289 Other specified depressive episodes: Secondary | ICD-10-CM

## 2011-03-18 DIAGNOSIS — F329 Major depressive disorder, single episode, unspecified: Secondary | ICD-10-CM

## 2011-03-18 DIAGNOSIS — IMO0001 Reserved for inherently not codable concepts without codable children: Secondary | ICD-10-CM

## 2011-03-18 MED ORDER — DULOXETINE HCL 60 MG PO CPEP: 60.0000 mg | ORAL_CAPSULE | Freq: Every day | ORAL | Status: DC

## 2011-03-18 NOTE — Assessment & Plan Note (Signed)
patient's depression appears to be significantly improved since his last visit. Will plan to continue Cymbalta, refills were sent to pharmacy. He is to followup in 3 months, and instructed to call sooner if symptoms worsen.

## 2011-03-18 NOTE — Progress Notes (Signed)
Subjective:    Patient ID: [name], male    DOB: [birth year], 35 y.o.   MRN: [id number]  HPI  [name] is a 35 year old male who presents today for routine followup.  1. Depression/anxiety-last visit the patient was noted to be extremely depressed. He was switched from cerebellar to Cymbalta. He reports that since medication adjustments that his depression has been very well controlled. He reports that he is now able to leave the house and participate in family activities. He is enjoying spending time with his children. He denies suicidal ideation. He denies insomnia or loss of appetite. He denies any recent anxiety attacks. He denies any associated adverse effects from the Cymbalta.  2. FM-  Reports that his pain has been well controlled with his pain medication. This has been managed by pain management. He denies any worsening of his fibromyalgia pain and with the transition from cephalic to Cymbalta.  Review of Systems See HPI  Past Medical History  Diagnosis Date  . Allergy   . Asthma   . Depression   . GERD (gastroesophageal reflux disease)   . Hypertension   . Diabetes mellitus     Type II  . Hyperlipidemia   . Chronic pain disorder     Sees [facility]  . Urinary incontinence     detrusor instability  . Hypogonadism     History   Social History  . Marital Status: Married    Spouse Name: N/A    Number of Children: 2  . Years of Education: N/A   Occupational History  . Disabled     chronic pain   Social History Main Topics  . Smoking status: Current Some Day Smoker -- 0.3 packs/day    Types: Cigarettes  . Smokeless tobacco: Never Used  . Alcohol Use: No  . Drug Use: No     stopped cocaine--2003. Normal drug screen March 2008  . Sexually Active: Not on file   Other Topics Concern  . Not on file   Social History Narrative  . No narrative on file    Past Surgical History  Procedure Date  . Appendectomy   . Hernia repair   .  Nasal sinus surgery 2008    Family History  Problem Relation Age of Onset  . Diabetes Mother   . Hypertension Mother   . Cirrhosis Mother   . Arthritis Father 49    osteoarthritis  . Diabetes Sister     borderline  . Heart disease Paternal Uncle 84    MI  . Heart disease Maternal Grandfather     late 60's--MI  . Cancer Paternal Grandmother 16    lung  . Heart disease Paternal Grandfather 31    MI    Allergies  Allergen Reactions  . Ciprofloxacin     REACTION: Rash and itching  . Hydrocodone-Acetaminophen     REACTION: Rash and itching    Current Outpatient Prescriptions on File Prior to Visit  Medication Sig Dispense Refill  . aspirin 81 MG tablet Take 81 mg by mouth daily.        Marland Kitchen azelastine (ASTELIN) 137 MCG/SPRAY nasal spray 2 sprays by Nasal route every morning. Use in each nostril as directed       . baclofen (LIORESAL) 20 MG tablet Take 20 mg by mouth at bedtime.        . cetirizine (ZYRTEC) 10 MG tablet Take 10 mg by mouth daily.        Marland Kitchen  ciclesonide (OMNARIS) 50 MCG/ACT nasal spray 2 sprays by Each Nare route at bedtime as needed.        . clonazePAM (KLONOPIN) 2 MG tablet Take 2 mg by mouth 2 (two) times daily as needed.        . insulin glargine (LANTUS) 100 UNIT/ML injection Inject 34 Units into the skin at bedtime.       . insulin lispro (HUMALOG) 100 UNIT/ML injection Inject 15 Units into the skin 3 (three) times daily before meals.       Demetra Shiner [employer] (AUTO-LANCET) MISC Check blood sugar 4 times daily.       . metFORMIN (GLUCOPHAGE) 1000 MG tablet Take 1,000 mg by mouth 2 (two) times daily with meals.        . Multiple Vitamins-Minerals (MULTIVITAMIN WITH MINERALS) tablet Take 1 tablet by mouth daily.        . nabumetone (RELAFEN) 750 MG tablet Take 750 mg by mouth 2 (two) times daily.        . niacin-simvastatin (SIMCOR) 1000-20 MG 24 hr tablet Take 1 tablet by mouth at bedtime.        Marland Kitchen omega-3 acid ethyl esters (LOVAZA) 1 GM capsule Take 4 tablets by  mouth daily.       . pregabalin (LYRICA) 100 MG capsule Take 100 mg by mouth 3 (three) times daily.        . tadalafil (CIALIS) 5 MG tablet Take 5 mg by mouth daily as needed.        . Tapentadol HCl (NUCYNTA) 100 MG TABS Take 1 tablet by mouth 4 (four) times daily as needed.        . traZODone (DESYREL) 50 MG tablet Take 1/2 tablet by mouth at bedtime.       Marland Kitchen DISCONTD: DULoxetine (CYMBALTA) 30 MG capsule Take 2 tabs by mouth daily.  60 capsule  1    BP 108/76  Pulse 66  Temp(Src) 97.9 F (36.6 C) (Oral)  Resp 16  Ht 5\' 11"  (1.803 m)  Wt 246 lb 1.3 oz (111.621 kg)  BMI 34.32 kg/m2       Objective:   Physical Exam  Gen.: Awake, alert, and in no acute distress Cardiovascular: S1-S2, regular rate and rhythm, no murmur Respiratory: Breath sounds are clear to auscultation bilaterally without wheezes rales or rhonchi Psychiatric: Patient is awake and alert and oriented x3, affect is calm and pleasant. Attention span appears normal. He is well-groomed, smiling. He does not appear depressed or anxious at this time.        Assessment & Plan:  15 minutes were spent with the patient today, greater than 50% of this time was spent counseling the patient on his anxiety and depression.

## 2011-03-18 NOTE — Assessment & Plan Note (Signed)
Clinically stable, patient to continue Cymbalta and followup with pain management.

## 2011-03-25 NOTE — Op Note (Signed)
NAMEKEONTRE, [name]              ACCOUNT NO.:  [id number]   MEDICAL RECORD NO.:  [id number]          PATIENT TYPE:  AMB   LOCATION:  DSC                          FACILITY:  [facility]   PHYSICIAN:  Karol T. [name], M.D. DATE OF BIRTH:  06-10-1976   DATE OF PROCEDURE:  03/15/2007  DATE OF DISCHARGE:                               OPERATIVE REPORT   PREOPERATIVE DIAGNOSES:  1. Deviated nasal septum.  2. Bilateral hypertrophic inferior turbinates.  3. Bilateral polypoid pansinusitis.   POSTOPERATIVE DIAGNOSES:  1. Deviated nasal septum.  2. Bilateral hypertrophic inferior turbinates.  3. Bilateral polypoid pansinusitis.   PROCEDURES PERFORMED:  1. Bilateral complete endoscopic ethmoidectomy.  2. Bilateral endoscopic antrostomy.  3. Bilateral endoscopic sphenoidotomy.  4. Nasal septoplasty.  5. Bilateral SMR inferior turbinates.   SURGEON:  [name]. [name], M.D.   ANESTHESIA:  General orotracheal.   BLOOD LOSS:  50 mL.   COMPLICATIONS:  None.   FINDINGS:  A significant rightward septal deviation with very large  inferior turbinates.  Polyps in the right middle meatus and slight  polypoid mucosa bilaterally diffusely.  Large nasal frontal recess on  each side.  A stable middle turbinate on each side.  No active infection  noted either side.   PROCEDURE:  With the patient in a comfortable supine position, general  orotracheal anesthesia was induced without difficulty.  At an  appropriate level, the patient was placed in a slight sitting position.  A saline moistened throat pack was placed.  Nasal vibrissae were  trimmed.  Cocaine crystals, 200 mg total were applied on cotton carriers  to the anterior ethmoid and sphenopalatine ganglion regions on both  sides.  Xylocaine with phenylephrine solution was applied on 1/2 x 3  inch cottonoids to both sides of the nasal septum.  The patient had  received preoperative Afrin nasal spray.  Xylocaine 1% with 1:100,000  epinephrine, 10  mL total was infiltrated into the nasal spine region,  into the membranous columella, into the anterior floor of the nose on  both sides, and into the submucoperichondrial plane of the septum on  both sides.  Several minutes were allowed for this to take effect.  A  sterile preparation and draping of the mid face was accomplished.   The InstaTrak apparatus was brought into the field and appropriate  orientation and registration was accomplished.   The nasal packing was removed and observed to be intact and correct in  number.  The findings were as described above.  It was felt appropriate  to straighten the septum before attempting to access the sinuses.  A  left-sided hemitransfixion incision was sharply executed and carried  down to the caudal edge of the quadrangular cartilage and continued into  a floor incision.  A small right floor incision was generated.  Floor  tunnels were executed on both sides brought medially posteriorly and  then brought for along the maxillary crest.  The submucoperichondrial  plane of the left septum was elevated up to the dorsum of the nose,  brought down to communicate with the floor tunnel and brought forward.  The  left flap was elevated intact.  The contra-ethmoid junction was  relatively far forward and was identified with a Risk analyst and  opened.  Superiorly, there seemed to be some reduplication of the septum  and the perpendicular plate of the ethmoid was very dense and thick.  The opposite submucoperiosteal plane was elevated.  The superior  perpendicular plate was lysed with an open Laren Boom forceps to  avoid rocking in the cribriform plate region.  This was carried  posteriorly.  Finally, the septum was detached from the maxillary crest  and further back from the vomer so that it was mobile and the mid  portion of the septum was rocked free and delivered with a closed [employer] forceps.  The posterior tail of the  quadrangular cartilage was  dissected and delivered as was some additional perpendicular plate  posteriorly.  The maxillary crest posterior to the septum was lowered  with a mallet on  osteotomes and rocked free and delivered.  The septum  was disarticulated from the maxillary crest but observed to come back  into the midline relatively straight.  There was some springiness at the  dorsal attachment of the septum.  The septum was freed from the upper  lateral cartilage in the left septal tunnel sharply.  The right side was  not done at this point.  Hemostasis was observed.  The right flap was  elevated intact also.  A drainage incision was made low posterior on the  left side.  The septum was brought into the midline and secured with a  figure-of-eight 4-0 PDS suture.  Both incisions were closed with  interrupted 4-0 chromic suture.   The nose was inspected once again.  Xylocaine 1% with 1:100,000  epinephrine on a 25 gauge spinal needle was infiltrated into the lateral  wall of the nose to the uncinate process region and into the anterior  pole of the middle turbinate on both sides.  Additional Xylocaine with  phenylephrine solution was applied on cottonoids into the middle meatus  on each side, and between the middle turbinate and the septum on both  sides.  Several additional minutes were allowed for this to take effect.  The nose was suctioned clear as was the pharynx.   The packing was removed from the right side of the nose and observed to  be intact and correct in number.  The 0 degree endoscope was introduced.  There was some difficulty with visualization because the turbinate was  thin and lateralized and the septum still had a tendency to spring  towards the right side.  The middle turbinate was medialized.  Polypoid  tissue in the middle meatus was removed with the power debrider.  The uncinate process was identified and lyse with a sickle knife and then  removed with the  debrider.  The antrostomy was visible.  This was  enlarged posteriorly and anteriorly using forward  and backward biting  punches.  The bulla ethmoidalis was opened bluntly and the root confines  were removed with a power debrider.  Working into the middle ethmoid  portion of the basal lamella of the middle turbinate was preserved but  the vertical lamella was penetrated low and lateral.  Air cells were  entered and under direct visualization using the 0 degree endoscope,  partitions were broken down.  Using the straight suction tip, additional  partitions were taken backward.  Using InstaTrak apparatus, the extent  of the dissection was noted to be the  anterior face of the sphenoid.  The partitions were carefully cleaned away with the punches and with the  power debrider.  Upon achieving good ethmoidectomy, the anterior face of  the sphenoid was entered low and medial and the opening was enlarged  with Hajek forceps and finally with the power debrider.  Working from  the sphenoid forward, the fovea ethmoidalis was probed and no additional  cells were identified.  Working further forward, a large frontal recess  was identified and the bony partitions inferior to the recess were  reduced with punch forceps, power debrider, and also with the giraffe  forceps.  The antrostomy was cleaned with a power debrider.  At this  point, the right side was completed.  There was no evidence of spinal  fluid leak and no evidence of orbital penetration.  Hemostasis was  observed.  A Xylocaine with phenylephrine pledget was applied to the  middle meatus.  This completed the right side.   Again the materials were removed from the left side and observed to be  intact and correct in number.  The middle turbinate was more bulbous of  this side and on x-ray had a small conchal cell which was not disturbed.   The uncinate process was sharply incised and then reduced with a power  debrider.  There were no polyps  on the left side.  The bulla ethmoidalis  was opened with a punch forceps and then debrided.  The antrostomy was  visible and was opened posteriorly and anteriorly and cleaned with the  power debrider.  Working low and lateral, the vertical lamella of the  middle turbinate was penetrated into the middle and posterior ethmoid  cells and then this opening was enlarged on direct vision working  bluntly up against the fovea.  Upon completing the ethmoidectomy, the  orientation showed that the anterior face of the sphenoid was still  intact.  This was penetrated with a straight suction tip low and medial  and the opening was enlarged with a Hajek forceps and communicated with  the posterior ethmoid cells which were quite large.  At this point the  fovea was probed and all cells were felt to have been reduced.  In the  anterior aspect, cells were reduced and a large frontal recess was identified and the bony partitions and mucosa inferior to this were  cleared away using the giraffe forceps and the power debrider.  Middle  turbinate was stable.  Hemostasis was observed.  There was no evidence  of spinal fluid leak or orbital penetration.  This completed the left  side.  The pledget was placed with Xylocaine and phenylephrine into the  area for hemostasis.  The pharynx was carefully suctioned clear.   Attention was returned to the right side and using the 0 and 30 degree  endoscopes, the entire area was inspected with no additional cells  identified.  No further intervention was felt necessary.  The pledget  was replaced.  The left side was observed once again.  On neither side  was there any evidence of spinal fluid leakage or orbital penetration.   Xylocaine 1% with 1:100,000 epinephrine, 5 mL total was infiltrated into  both of the inferior turbinates and several minutes were allowed for  hemostasis to take effect.  In the meanwhile, Silastic splints and Telfa  packs were prepared.    Beginning on the right side, the anterior hood of the inferior turbinate  was sharply lysed just behind the nasal valve.  The medial mucosa of the  turbinate was incised in a laterally based flap was developed.  The  turbinate was infractured.  Using angled turbinate scissors, turbinate  bone and lateral mucosa were resected taking virtually all the anterior  pole and leaving virtually all the posterior pole.  Bony spicules were  removed to allow more prompt healing.  The flap was laid back down.  The  bulbous posterior pole of the turbinate and the cut edges of the flap  were suction coagulated and then the flap was laid down and the  turbinate was outfractured.  This completed the right inferior  turbinate.  The left side was done in identical fashion.   At this point, the 0.040 reinforced Silastic splint was placed against  the septum on both sides and secured thereto with a 3-0 Ethilon stitch.  Prior to this, the septum had been freed from the upper lateral  cartilages transmucosally on the right side as was previously down  through the tunnel on the left side.  This allowed more mobility of the  septum which was then in the midline and relatively straight.   Triple thickness Neosporin impregnated Telfa packs with a 2-0 silk tag  were placed into the ethmoid block in each side.  An additional Telfa  pack without a tag was applied in between the inferior turbinate and the  septum on both sides.  Hemostasis was observed.  At this point the  procedure was completed.  The pharynx was suctioned clear and throat  pack was removed.  The patient was returned to anesthesia, awakened,  extubated, and transferred to recovery in stable condition.   COMMENT:  A 35 year old white male with a long history of refractory  polypoid pansinusitis as well as diabetes and fibromyalgia were several  indications for today's procedure.  Anticipated routine postoperative recovery with  attention to ice,  elevation, analgesia, antibiosis.  Will remove the  nasal packs in one day and the nasal splints in 10 days.  Will begin  nasal irrigations as soon as practical.  Given low anticipated risk of  postanesthetic or postsurgical complications, feel an outpatient venue  is appropriate.      [name]. [name], M.D.  Electronically Signed     KTW/MEDQ  D:  03/15/2007  T:  03/15/2007  Job:  [id number]   cc:   Franchot Heidelberg, M.D.  Jessica Priest, M.D.

## 2011-03-25 NOTE — H&P (Signed)
[name], [name]              ACCOUNT NO.:  [id number]   MEDICAL RECORD NO.:  [id number]          PATIENT TYPE:  OBV   LOCATION:  A211                          FACILITY:  [facility]   PHYSICIAN:  Skeet Latch, DO    DATE OF BIRTH:  05/26/1976   DATE OF ADMISSION:  01/17/2008  DATE OF DISCHARGE:  LH                              HISTORY & PHYSICAL   CHIEF COMPLAINT:  Chest pain.   HISTORY OF PRESENT ILLNESS:  This is a 35 year old Caucasian male who  presents with the complaint of chest pain.  The patient states that on  Saturday he awoke and he started having dizziness and unsteady gait.  The patient then checked his blood sugar and states that it was  elevated.  The patient then presented to [facility] ER  complaining of pressure-like chest pain and was sent home with insulin  for his diabetes, Toradol for pain, and Meclizine for dizziness.  The  patient states that the pain has continued and he denies associated  headache, denies shortness of breath, and presented to [facility] ER this evening with those complaints.  The patient  states that he had a negative stress test approximately two years prior  that was normal.  At this time the patient states that the pain is dull,  8 to 9 out of 10, and states that it is sharp, is left-sided, and it is  pretty steady at this time.  The patient denies any exertion that brings  on the pain and denies any nausea or vomiting.   PAST MEDICAL HISTORY:  Hyperlipidemia, insulin-dependent diabetes  mellitus, chronic pain, hypertension, depression, previous history of  alcohol and drug abuse.   ALLERGIES:  CIPRO.   PAST SURGICAL HISTORY:  Hernia repair umbilicus and surgery for a  deviated septum.   CURRENT MEDICATIONS:  1. Chantix 1 mg daily.  2. Cymbalta 60 mg daily.  3. Januvia 100 mg daily.  4. Lovaza 1000 mg four times a day.  5. Lyrica 150 mg three times a day.  6. Magnesium 500 mg daily.  7.  Multivitamin one tablet daily.  8. Aspirin 81 mg daily.  9. Baclofen 20 mg daily.  10.Fentanyl 50 mcg transdermal patch every 48 hours.  11.Symcor 1000/20 mg daily.  12.Trazodone 25 mg daily.  13.Xanax 1 mg three times a day.  14.Lisinopril unknown dose.   SOCIAL HISTORY:  He lives in [place].  He is married and has two  children.  Current smoker, denies any alcohol and any drug abuse at this  time.   REVIEW OF SYSTEMS:  Please see HPI.   PHYSICAL EXAMINATION:  VITAL SIGNS:  Temperature 97.2, pulse 66,  respirations 18, and blood pressure 105/62, saturating 98% on 2 liters.  GENERAL:  Well-developed, well-nourished, well-hydrated, slightly ill-  appearing in no acute distress.  HEENT:  Head is normocephalic and atraumatic, PERRLA, EOMI.  NECK:  Soft, supple, slightly tender left sided.  No JVD and no  thyromegaly.  CARDIOVASCULAR:  Regular rate and rhythm without murmurs, rubs, or  gallops.  LUNGS:  Clear.  Shallow expirations are noted.  No rales, rhonchi, or  wheezing. Left-sided chest tenderness on palpation.  ABDOMEN:  Soft, nontender, and nondistended.  EXTREMITIES:  No cyanosis, clubbing, or edema.  NEUROLOGY:  Cranial nerves II-XII grossly intact.   LABORATORY DATA:  Last troponin less than 0.05, CK-MB less than 1.  Urine drug screen positive for benzodiazepines.  Urinalysis positive for  ketones, no nitrites or leukocytes.  BNP less than 30.  D-dimer 0.22.  Sodium 135, potassium 3.6, chloride 101, CO2 27, glucose 364, BUN 6,  creatinine 0.85.  White count 7.1, hemoglobin 14.9, hematocrit 41.6,  platelets 156.   RADIOLOGIC STUDIES:  Chest x-ray shows shallow lung volume.   CT scan of his chest negative for pulmonary embolism.  Shows bibasilar  atelectasis.   ASSESSMENT:  1. Chest pain.  2. Insulin-dependent diabetes mellitus.  3. Hypertension.  4. History of depression.  5. History of alcohol and cocaine abuse.  6. History of tobacco abuse.  7. History of  hyperlipidemia.  8. History of chronic pain.   PLAN:  The patient will be admitted to telemetry unit for close  monitoring.  For this chest pain, the patient will be on IV pain  medication with nitroglycerin as needed and aspirin.  We will also  consult cardiology at this time.  For his history of hyperlipidemia and  hypertension, the patient will be placed on his home medications and we  will get a lipid panel in the a.m.  For his chronic pain, the patient  will also be placed on his home medications at this time.  For his  history of depression, the patient will also be placed on his home  medications.  For his diabetes, the patient is on Januvia.  This will be  added to his daily medicines.  The patient will be placed on a sliding  scale with blood sugars checked q.a.c. and q.h.s. and the patient will  be on a diabetic diet.     Skeet Latch, DO  Electronically Signed    SM/MEDQ  D:  01/18/2008  T:  01/18/2008  Job:  [id number]

## 2011-03-25 NOTE — Group Therapy Note (Signed)
NAMECHEO, SELVEY              ACCOUNT NO.:  [id number]   MEDICAL RECORD NO.:  [id number]          PATIENT TYPE:  OBV   LOCATION:  A211                          FACILITY:  [facility]   PHYSICIAN:  Skeet Latch, DO    DATE OF BIRTH:  [birth year]   DATE OF PROCEDURE:  01/18/2008  DATE OF DISCHARGE:                                 PROGRESS NOTE   SUBJECTIVE:  Mr. Coey states that his breathing and chest discomfort  is improving.  The patient states he is having chest pain on deep  inspirations, but received some IV pain medications.  States that this  has helped his chest pain and shortness of breath.  The patient has  breathing treatments ordered, and did not receive breathing treatment  secondary to improvement in his breathing at this time.   OBJECTIVE:  VITAL SIGNS:  Temperature 97.3, respirations 18, heart rate  66, blood pressure 105/62, saturating 98% on 2 liters.  CARDIOVASCULAR:  Regular rate and rhythm.  No murmurs, rubs or gallops.  LUNGS:  Clear to auscultation bilaterally.  No rhonchi, rales or  wheezes.  ABDOMEN:  Soft, nontender, nondistended.  Positive bowel sounds.  No  rigidity or guarding.  EXTREMITIES:  No cyanosis, clubbing or edema.   LABORATORY DATA:  Pending at this time.   ASSESSMENT/PLAN:  1. Chest pain.  So far his workup has been negative.  Awaiting a      Cardiology consultation regarding and stress test or any other      cardiac monitoring at this time.  The patient will continue on      oxygen, nitroglycerin as needed and IV pain medications at this      time.  2. For his diabetes, hypertension and depression, the patient is on      his home medications.  Will continue to follow.  3. Anticipate patient being discharged in the next 24-48 hours if he      continues to be stable.      Skeet Latch, DO  Electronically Signed     SM/MEDQ  D:  01/18/2008  T:  01/18/2008  Job:  [id number]

## 2011-03-25 NOTE — Discharge Summary (Signed)
[name], [name]              ACCOUNT NO.:  [id number]   MEDICAL RECORD NO.:  [id number]          PATIENT TYPE:  OBV   LOCATION:  A211                          FACILITY:  [facility]   PHYSICIAN:  Dorris Singh, DO    DATE OF BIRTH:  18-Sep-1976   DATE OF ADMISSION:  01/17/2008  DATE OF DISCHARGE:  03/10/2009LH                               DISCHARGE SUMMARY   ADMISSION DIAGNOSIS:  1. Chest pain  2. Insulin dependent diabetes  3. Hypertension  4. History of depression  5. History of alcohol and cocaine abuse.  6. History of tobacco abuse  7. History of hyperlipidemia  8. History of chronic pain.   DISCHARGE DIAGNOSES:  1. Atypical chest pain  2. Insulin dependent diabetes  3. Hypertension  4. History of depression  5. History of alcohol and cocaine abuse.  6. History of tobacco abuse  7. History of hyperlipidemia  8. History of chronic pain.   PRIMARY CARE PHYSICIAN:  And the patient also states he has a  endocrinologist but did not have his name.   H&P:  His H&P was done by Dr. Lilian Kapur.  Please refer to that, but to  summarize the patient is a 31-year male who presented with a complaint  of chest pain.  The patient was admitted to the service of [facility]  even though his cardiac enzymes were negative, but he does have  extensive risk factors. The patient continue while on the floor to  complain of chest pain.  He was placed on his home medications while he  was here as well as GI and DVT prophylaxis.  The patient's records were  obtained from [facility]. His last echo was done in  February 2007.  He was seen by Dr. Elsie Lincoln today from [facility]  Cardiology whose recommendations states that even though he has multiple  risk factors he does need a repeat stress test, however he does not feel  that with his enzymes, this is me paraphrasing, that it is warranted to  be done today. It can be done outpatient as soon as it is reasonable.  So what we will do  today is the patient will go ahead and be discharged  and have him set up an appointment and contact the cardiologist on call  that the patient will need a stress test outpatient within the next week  and that he will be sent home on the following medications. It is  recommended that he would be on a aspirin 81 mg daily.  Also to take  anti-inflammatories since it seems more pleuritic in nature.  That may  help with the pain. It is also recommended due to his extensive use of  narcotic medications for chronic back pain that he actually increase the  frequency of his Fentanyl patch from 15 mEq from every 48 hours to every  72 hours to decrease the risk of possible overdose and actually to  decrease his narcotic medications.  On my interview the patient was very  sedated almost, and this has been a concern of the nurses due to his  high level  of sedation while being in the hospital, so my recommendation  was to hold all of his sedated medications today.  He will be sent home  on lisinopril which I do not have the dose, Xanax 0.1 mg three times a  day, trazodone 25 mg daily, Symcor 1000/20 tablets daily, baclofen 20 mg  daily, aspirin 81 mg daily, multivitamin 1 tablet daily, magnesium 500  mg daily, Lyrica 150 mg three times a day, Lovaza 1000 mg daily,  Cymbalta 60 mg daily and Chantix 1 mg daily.   The patient's condition is stable.  Disposition will be to home and as  mentioned before he is to do a low-sodium diet.  He is to increase his  activity.  He is to stop smoking.  He is to follow up with [facility]  Cardiology for repeat stress test.      Dorris Singh, DO  Electronically Signed     CB/MEDQ  D:  01/18/2008  T:  01/18/2008  Job:  [id number]

## 2011-03-28 NOTE — Discharge Summary (Signed)
NAME:  GIULIO, BERTINO                        ACCOUNT NO.:  [id number]   MEDICAL RECORD NO.:  [id number]                   PATIENT TYPE:  INP   LOCATION:  0457                                 FACILITY:  [facility]   PHYSICIAN:  Abigail Miyamoto, M.D.              DATE OF BIRTH:  1976-08-03   DATE OF ADMISSION:  01/25/2004  DATE OF DISCHARGE:  01/27/2004                                 DISCHARGE SUMMARY   DISCHARGE DIAGNOSES:  1. Abdominal pain of uncertain etiology.  2. Fecal appendicolith.  3. Umbilical hernia.   HISTORY OF PRESENT ILLNESS:  Mr. Umair Rosiles is a 35 year old gentleman  who presented to the office with bilateral lower quadrant abdominal pain.  He was found to have an umbilical hernia.  He had a CAT scan showing him to  have an appendicolith which was done to rule out renal stones.  Given these  findings and extreme tenderness on examination, decision was made to proceed  to the operating room for repair of the hernia and diagnostic laparoscopy  and appendectomy.   HOSPITAL COURSE:  The patient was admitted and taken to the operating room  on January 25, 2004, where he underwent a diagnostic laparoscopy, umbilical  hernia repair and an appendectomy.  There was no evidence of inguinal  hernias.  The appendix did appear normal except for the fecalith.  The small  umbilical hernia was repaired.   Postoperatively, he was taken in stable condition to a regular floor.   On postoperative day #1, he was complaining of incisional pain and nausea.  Diet was started and it was felt he could go home later today.  However, he  still remained nauseated and there was a question of an episode of emesis.  Therefore decision was made to keep him overnight.   By January 27, 2004, he was feeling much better.  He was ambulating.  Pain was  controlled on Percocet.  He was changed to oral antibiotics.  His nausea  subsided.  Abdomen was soft.  Decision was made to discharge the patient to  home.   DISCHARGE MEDICATIONS:  1. Cipro 500 mg p.o. b.i.d.  2. Percocet and Advil as needed for pain.  3. Phenergan for nausea.   ACTIVITY:  He is to do no heavy lifting greater than 20 pounds for four  weeks.   WOUND CARE:  He may shower.   DIET:  He has no restrictions.   FOLLOW UP:  He will follow up in my office next week for wound check.                                               Abigail Miyamoto, M.D.    DB/MEDQ  D:  02/15/2004  T:  02/16/2004  Job:  [id number]

## 2011-03-28 NOTE — Discharge Summary (Signed)
NAMEROBBI, SCURLOCK              ACCOUNT NO.:  [id number]   MEDICAL RECORD NO.:  [id number]          PATIENT TYPE:  INP   LOCATION:  6727                         FACILITY:  [facility]   PHYSICIAN:  Hettie Holstein, D.O.    DATE OF BIRTH:  1975-11-15   DATE OF ADMISSION:  02/18/2005  DATE OF DISCHARGE:  02/23/2005                                 DISCHARGE SUMMARY   ADDENDUM:  As I discussed with Mr. [name], I have contacted [facility]  neuromuscular specialist Dr. Eudelia Bunch at [phone] in reference to  outpatient evaluation. They have requested copies of his recent discharge  summaries and H&P's and I am requesting our transcription office to send  these to him at [phone].  [name] contact information is as  follows:  Phone number during the day [phone] and his night phone is  [phone].      ESS/MEDQ  D:  02/24/2005  T:  02/24/2005  Job:  [id number]   cc:   Eudelia Bunch, M.D.  [facility]

## 2011-03-28 NOTE — H&P (Signed)
[name], [name]              ACCOUNT NO.:  [id number]   MEDICAL RECORD NO.:  [id number]           PATIENT TYPE:   LOCATION:                                 FACILITY:   PHYSICIAN:  [name], M.D.DATE OF BIRTH:  12/15/1975   DATE OF ADMISSION:  02/19/2005  DATE OF DISCHARGE:                                HISTORY & PHYSICAL   PRIMARY CARE PHYSICIAN:  Dr. [name]   NEUROLOGIST:  [name], MD   This is a 35 year old Caucasian male with past medical history of diabetes,  hypertension, depression, bipolar disorder and a history of alcohol abuse  and cocaine abuse, who was transferred from [facility] earlier this  evening for continued work up of his presumed neurologic deficits for which  a diagnosis has not been found yet. He now states that he is noticing  worsening weakness of his extremities and continued pain of his shoulders,  biceps bilaterally and quadriceps muscles lateral aspects bilaterally. He  was admitted per transfer summary note at [facility] for muscle spasms and  having had two falls. Discharge dictation from the physician at [facility]  indicates the reason for transfer to continue having his work up done here  by Dr. Pearlean Brownie or his group as he is known to them, and in addition the  resident neurologist at [facility] is not available for a week.  It should  be noted he has had a rather extensive work up including EMGs, MRIs, x-rays  and even a second opinion by referral to [facility] for these symptoms. So far  his work up has been negative in looking for rheumatologic, neurologic as  well as systemic illnesses. Please see discharge summary from Dr. Osvaldo Shipper dated February 18, 2005 as well as reports by Dr. Corrie Dandy L. Campaign-  Hollice Espy from [facility]'s neurologic clinic. This evaluation at  [facility] occurred in March, 2006. Their final conclusion in the January 29, 2005 dictated note states that they were going to follow his liver  enzymes  which were elevated and no objective evidence of serious neurologic disease  could account for the symptoms that he was presenting with. It also states  that they felt he would have good function recovery with physical therapy  and symptomatic treatment. He was previously seen again at the [facility] in February, 2006 with again a conclusion that there were no further  recommendations from that neurologic stand-point. There was a thought  dictated in the note from that visit that theoretically it could be a defect  in muscle energy production (i.e., McArdle's disease, etc.), however,  symptoms and diagnostic as well as objective findings were not consistent  with this diagnosis.   PAST MEDICAL HISTORY:  1.  Diabetes.  2.  Hypertension.  3.  Depression.  4.  Bipolar disorder.  5.  History of alcohol abuse.  6.  History of cocaine abuse.   Dictation ended at this point.      JD/MEDQ  D:  02/19/2005  T:  02/19/2005  Job:  [id number]

## 2011-03-28 NOTE — Procedures (Signed)
[name], [name]              ACCOUNT NO.:  [id number]   MEDICAL RECORD NO.:  [id number]          PATIENT TYPE:  OUT   LOCATION:  SLEEP LAB                     FACILITY:  [facility]   PHYSICIAN:  Marcelyn Bruins, M.D. [facility] DATE OF BIRTH:  May 02, 1976   DATE OF STUDY:  05/25/2005                              NOCTURNAL POLYSOMNOGRAM   REFERRING PHYSICIAN:  Dr. Molly Maduro Day.   INDICATION FOR STUDY:  Hypersomnia with sleep apnea..   EPWORTH SCORE:  22   SLEEP ARCHITECTURE:  The patient had total sleep time of 250 minutes with  adequate slow wave sleep but decreased REM. Sleep onset latency was quite  prolonged at 62 minutes and REM onset was normal. Sleep efficiency was only  64%.   IMPRESSION:  1.  Small numbers of obstructive events which do not meet the respiratory      disturbance index criteria for the obstructive sleep apnea syndrome. 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 There were also large numbers of nonspecific arousals which      can be consistent with upper airway resistant syndrome. The events were      clearly worse during REM. Treatment of the upper airway resistant      syndrome can consist of weight loss, upper airway surgery, oral      appliance, or CPAP. Clinical correlation is suggested.  2.  No clinically significant cardiac arrhythmias.  3.  The patient was found to have a very large Epworth score consistent with      significant daytime sleepiness. This is out of portion to the degree of      abnormalities found on a sleep study. He is taking quite a few      medications which can cause daytime sleepiness, and I would wonder      whether these are interacting.     ______________________________  Suzzette Righter    KC/MEDQ  D:  06/02/2005 16:19:04  T:  06/03/2005 00:25:43  Job:  [id number]

## 2011-03-28 NOTE — Assessment & Plan Note (Signed)
[facility]                             PULMONARY OFFICE NOTE   NAME:[name], Timothy L                     MRN:          [id number]  DATE:11/20/2006                            DOB:          Sep 10, 1976    SLEEP MEDICINE FOLLOWUP   SUBJECTIVE:  Mr. [name] comes in today for followup of his  psychophysiologic insomnia, and also poor sleep hygiene and chronic pain  syndrome.  At the last visit, I had worked with him on his sleep hygiene  issues, and had discussed stimulus control therapy as well as omission  of caffeine in his diet, and also helped him with trazodone and Rozerem  for sleep onset.  Patient states he has done very, very well on this.  Unfortunately, his insurance would not cover Rozerem, so he has been  using trazodone at 50-100 mg nightly, and seems to be getting to sleep  quite easily with it.  He feels that he is sleeping better through the  night, and is much more rested the following morning.  He has only been  having light snoring according to his bed partner.  He continues to have  great difficulty with chronic pain, and this is being evaluated by his  primary care doctor.  Patient overall feels that he is much improved.  He has quit drinking his 2 L of caffeinated [facility] each day.   PHYSICAL EXAMINATION:  Blood pressure is 118/68, pulse 72, temperature  97.6, weight 251 pounds, O2 saturation on room air is 97%.   IMPRESSION:  Psychophysiologic insomnia complicated by poor sleep  hygiene and chronic pain syndrome.  The patient is much improved with  trazodone at bedtime and better sleep hygiene.  The good thing about  trazodone is that it is non-addictive, and also does not have tolerance  properties.  This may also help with chronic pain syndrome.  I really  think that if he gets his pain better controlled, continues to work on  sleep hygiene, and loses some weight, that his sleep will be much  improved and can probably come  off of trazodone.  This is the goal for  the patient.   PLAN:  1. Stay on trazodone 50 mg to 100 mg nightly p.r.n. until his chronic      pain syndrome is better.  2. Continue sleep hygiene measures.  3. Work on weight loss.  I have told the patient that if the snoring      worsens and his sleepiness gets to be more of an issue that is      different from his current state, he is to let me know.  Patient      will follow up on a p.r.n. basis.     Barbaraann Share, MD,FCCP  Electronically Signed    [facility]/MedQ  DD: 11/20/2006  DT: 11/20/2006  Job #: [id number]   cc:   Franchot Heidelberg, M.D.

## 2011-03-28 NOTE — H&P (Signed)
[name], [name]              ACCOUNT NO.:  [id number]   MEDICAL RECORD NO.:  [id number]          PATIENT TYPE:  INP   LOCATION:  6703                         FACILITY:  [facility]   PHYSICIAN:  [name], M.D.DATE OF BIRTH:  [birth year]   DATE OF ADMISSION:  02/18/2005  DATE OF DISCHARGE:                                HISTORY & PHYSICAL   This is continuing from previous dictation, which was abruptly disconnected.   Primary care physician is Dr. [name].  Neurology:  [name], MD   This is a 35 year old Caucasian male with a diagnosis of diabetes type 2,  hypertension, depression, bipolar disorder (history of alcohol abuse and  cocaine abuse), who has been transferred from [name] for continued  workup of his symptoms, presumed to be of neurologic basis.  He currently  states that his weakness has been worsening over the course of the last few  weeks.  The patient was admitted per chart records to [name] on the 9th  for complaints of worsening muscle spasms and falls x2 days.  This note  indicates that he has had a rather extensive workup by Dr. Pearlean Brownie as well as  had a second opinion/referral to [name] and a workup there as well.  [name] records that area available to me at this time indicate that  from a February 2006 clinic visit, the patient was seen by not objective  findings noted during that visit.  Under Impression from that February  2006 note states that it was a relatively nonfocal exam.  There was a  hypothetical possibility that McArdle disease or some other defect of energy  muscle production enzyme could be involved; however, findings at that visit  were not consistent with that.  Conclusion on the February 2006 note  indicates that the patient could continue with Dr. Pearlean Brownie for workup,  otherwise refer back to the clinic for further recommendations.  He was seen  again on February 02, 2005, by Dr. Saverio [name].  Her notes indicates that  again, no objective evidence of any serious neurologic disease that would be  causing his symptoms.  She notes that there will probably be good functional  recovery with physical therapy and symptomatic treatment.  Her only other  thought dictated in the note states that she would just follow the patient's  liver enzymes and see if they are elevated.  At the time of dictation, Dr.  [name] clinic notes are not available for review.   PAST MEDICAL HISTORY:  1.  Diabetes 2.  2.  Hypertension.  3.  Depression.  4.  Bipolar disorder.  5.  History of alcohol abuse (quit approximately a year ago).  6.  Cocaine abuse, history of.   MEDICATIONS:  Transferring medications include:   1.  Diazepam 10 mg p.o. b.i.d.  2.  Flonase one spray to each nostril daily.  3.  Neurontin 400 mg with dinner and q.h.s. and 600 mg at breakfast and      lunch.  4.  Doxepin 10 mg at breakfast and lunch and 20 mg with dinner and at  bedtime.  5.  Metformin 500 mg p.o. b.i.d.  6.  Nicotine patch 14 mg to be applied daily.  7.  Protonix 40 mg daily.  8.  Altace 5 mg daily.  9.  __________ 50 mg q.h.s.  10. Tylenol 325 mg p.r.n. q.4h. for pain.   FAMILY HISTORY:  Mother has type 2 diabetes.   PAST SURGICAL HISTORY:  Had a small umbilical hernia repair and an  appendectomy during an exploratory laparotomy for abdominal pain.   ALLERGIES:  VICODIN and CIPRO both cause itching and a rash.   SOCIAL HISTORY:  Active tobacco user.  History of alcohol use, quit  approximately a year ago.  Notes indicate he had a history of cocaine abuse.  He is currently married and lives with his wife and two children, who are  alive and healthy, in [name]   REVIEW OF SYSTEMS:  Negative except for HPI.  In addition, the patient  stated to nursing staff after I had left that he is now becoming incontinent  of urine.   PHYSICAL EXAMINATION:  VITAL SIGNS:  Physical exam is limited given the hour  of the day; however,  the patient was afebrile, blood pressure 122/84,  respirations 22, saturating 95% on 2 L.  His heart rate is 100.  GENERAL:  This is an ill-appearing, well-developed, well-nourished Caucasian  male lying in bed with his arms resting on pillows.  HEENT:  Unremarkable.  Pupils equal, round, and reactive to light.  Extraocular movements are intact.  Oropharynx is clear.  NECK:  Without JVP, bruits or masses present.  CARDIOVASCULAR:  Regular rate and rhythm, no murmurs, rubs or gallops.  CHEST:  Clear to auscultation bilaterally with good air movement.  ABDOMEN:  Soft, nontender, nondistended, bowel sounds present.  EXTREMITIES:  He has good pulses throughout.  NEUROLOGIC:  His strength is weak; however, this objectively seems effort-  related.  He does complain of his finger grip strength waxes and wanes  during my exam.  His strength exam again also waxes and wanes during my  exam.  At points he is able to flex and extend and at other points during  the moments, his muscles seem to give away.  When I feel for muscle tone  during my exam, it seems that there is no tone when his weakness is  transiently occurring.  His sensation is intact bilaterally; however, he  does have increased pain to bilateral biceps, shoulders and bilateral  lateral aspects of his thighs.   LABORATORY DATA:  CT, labs done on admission to [facility] on February 16, 2005,  indicate a negative head CT for any acute abnormalities or any chronic  changes, negative cervical spine and lumbar spine films.  Currently this  morning his CBC is normal.  BMET is also normal except for glucose of 120.  His AST today is 51, which is slightly elevated from 49, and his ALT is 119,  which is slightly elevated from 111.  Otherwise, his albumin, alkaline  phosphatase and total bilirubin are normal.  His CK, done again at [facility], was 98.  He also had a sedimentation rate done, which was normal at  [name].  Other [facility] labs  include a negative ANA, hepatitis panel  and CRP.  He had a negative UDS except positive for benzodiazepines.  His  TSH was elevated at 6.839.   ASSESSMENT AND PLAN:  1.  Muscle weakness/fatigue/pain syndrome.  2.  Diabetes.  3.  Hypertension.  4.  History of depression.  5.  History of alcohol and cocaine use.  6.  Tobacco use.   Will continue orders from [name].  His systemic illnesses are stable.  I  have spoken with Dr. Orlin Hilding overnight with some confusion in communication  with regard to the emergent nature of this admission.  At this time there  seems to be no emergency, and we will ask Dr. Pearlean Brownie to resume care in follow-  up consultation while he is here at [name].      JD/MEDQ  D:  02/19/2005  T:  02/19/2005  Job:  [id number]   cc:   [name], MD  Fax: [phone]   Dr. [name]

## 2011-03-28 NOTE — Discharge Summary (Signed)
NAMEJAHLEN, BOLLMAN              ACCOUNT NO.:  [id number]   MEDICAL RECORD NO.:  [id number]          PATIENT TYPE:  INP   LOCATION:  5731                         FACILITY:  [facility]   PHYSICIAN:  Madeleine B. Vanstory, M.D.DATE OF BIRTH:  05-09-1976   DATE OF ADMISSION:  01/05/2005  DATE OF DISCHARGE:  01/06/2005                                 DISCHARGE SUMMARY   DISCHARGE DIAGNOSES:  1.  Spasms.  2.  Diabetes.  3.  Pain with radiation.  4.  Tobacco abuse.  5.  Bipolar disorder.  6.  History of alcohol abuse.  7.  History of cocaine abuse.  8.  Depressive disorder.  9.  Carpal tunnel syndrome not verified by EMG.  10. Asthma.  11. 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   MEDICATIONS:  1.  Neurontin 300 mg p.o. t.i.d.  2.  Baclofen 10 mg p.o. t.i.d.  3.  Flonase two puffs daily.  4.  Metformin 500 mg p.o. q.a.m. and 1000 mg p.o. q.h.s.  5.  Nortriptyline 25 mg t.i.d.  6.  Voltaren XR 100 mg p.o. daily.  7.  Advair 250/50 one puff daily.  8.  Prilosec 20 mg p.o. daily.  9.  Nicotine patch 21 mg daily.   HISTORY OF PRESENT ILLNESS:  This is a 36 year old male who came in to the  ED complaining of spasms.  This has been a chronic problem for 6 to 12  months which he describes as a precipitating factor when he injured his leg  and was put on Indocin and then felt a pop in his neck and since then has  had burning and tingling down his spine.  The patient has seen a  neurologist, 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  The patient has had full neurological workup,  MRI negative for MS.  Talked to primary M.D. who suggested this is likely a  psychological disorder.  The patient was admitted for 23-hour observation,  but the patient left AMA before any changes in medications were given.   CONDITION ON DISCHARGE:  Unknown.   FOLLOW UP:  Unknown.      MBV/MEDQ  D:  01/07/2005  T:  01/07/2005  Job:  [id number]

## 2011-03-28 NOTE — Consult Note (Signed)
NAME:  ERMAL, HABERER              ACCOUNT NO.:  [id number]   MEDICAL RECORD NO.:  [id number]          PATIENT TYPE:  INP   LOCATION:  6703                         FACILITY:  [facility]   PHYSICIAN:  [name], MD    DATE OF BIRTH:  04-01-1976   DATE OF CONSULTATION:  DATE OF DISCHARGE:                                   CONSULTATION   REFERRING PHYSICIAN:  __________.   REASON FOR REFERRAL:  Muscle spasms and weakness.   HISTORY OF PRESENT ILLNESS:  Mr. Poke is a 35 year old Caucasian male who  has had multifocal myriad symptoms now for about a year.  They apparently  began following a knee strain when he tripped at work.  He has been  complaining of intermittent muscle spasms, generalized weakness, visual  difficulties, headaches and back pain.  He has had an extensive medical  workup for systemic conditions, which was negative.  He was initially seen  in the family practice clinic by Santiago Bumpers. Hensel, M.D., and subsequently  referred to me as an outpatient and I did extensive workup including MRI  scan of the brain, EMG nerve conduction studies, which were unremarkable.  NP glutamic acid decarboxylase antibodies were weakly positive, but the  patient is a diabetic and it may be false positive from diabetes.  The  patient was, in fact, referred by me for a second opinion to [facility], where he was seen and they did not find any organic basis for his  neurological symptoms.  The patient states his symptoms got worse and he was  admitted to [facility] on February 16, 2005.  I was consulted by Osvaldo Shipper, M.D., and said I have nothing new to add to this patient's  condition.  The neurologist at [facility], Dr. Gerilyn Pilgrim, was on  vacation for a week so the patient ended up being transferred here and the  family requested that I evaluate the patient, and I am back to see him.   PAST MEDICAL HISTORY:  Significant for diabetes and hypertension,  depression, bipolar disorder, alcohol abuse, cocaine abuse, asthma.   HOME MEDICATIONS:  Neurontin, Baclofen, Flonase, metformin, Altace,  Prilosec, Valium.   SOCIAL HISTORY:  The patient is a smoker who claims he quit 10 days ago and  does not drink alcohol.  Denies doing drugs at present.  He lives with his  wife and two children.   MEDICATION ALLERGIES:  VICODIN and CIPRO.   FAMILY HISTORY:  Not significant for anybody with neurological problems.   PAST SURGICAL HISTORY:  Umbilical hernia and appendectomy.   PHYSICAL EXAMINATION:  GENERAL:  A comfortable-looking Caucasian male who is  not in distress.  VITAL SIGNS:  Afebrile, pulse rate 72 per minutes, respiratory rate 16 per  minute, __________.  HEENT:  Head is nontraumatic.  NECK:  Supple without bruit.  CARDIAC:  Normal, no gallop.  CHEST:  Lungs clear to auscultation.  NEUROLOGIC:  The patient is awake, alert, oriented, no pressured speech, no  dysphagia or dysarthria.  Eye movements are full range without nystagmus.  Face is  symmetric.  Bilateral movements are normal.  Tongue is midline.  Motor system exam shows variable effort, but I cannot detect any focal  weakness.  He is able to hold all four extremities up against gravity.  He  does tend to move the lower extremities less than the upper extremities, and  there is poor effort clearly.  Deep tendon reflexes are 2+ and symmetric in  the upper extremities, and they are 1+ in the lower extremities but can be  elicited.  The plantars are downgoing.  There is no obvious sensory loss.  The patient complains of generalized weakness and is unable to sit up and  walk.   __________ MRI scan of the brain done today reveals no active abnormality,  mild incidental sinusitis is noted.  Previous MRI scan of the brain from  December 2005 was again normal with only some sinusitis changes.  MRI scan  of the C-spine showed mild degenerative changes.  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  Antiglutamic acid and  decarboxylase antibodies are slightly elevated.   IMPRESSION:  A 35 year old gentleman with a myriad of symptoms of multifocal  systems with no objective findings from exam and extensive negative brain  imaging and electrophysiological studies.  I am unable to come up with an  organic neurological basis for explanation of his symptoms.   PLAN:  I again had an extensive discussion with the patient and multiple  family members regarding the results of my evaluation and the lack of  objective findings.  I am unable to recommend any further neurological  testing.  The patient insists on further neurological workup and second  opinion, and so I will recommend one of my colleagues, Dr. Anne Hahn or  Thad Ranger, to offer a second opinion in the morning.  The patient will  benefit with rehabilitation.  Kindly call for questions.      PPS/MEDQ  D:  02/19/2005  T:  02/20/2005  Job:  [id number]

## 2011-03-28 NOTE — H&P (Signed)
[name], [name]              ACCOUNT NO.:  [id number]   MEDICAL RECORD NO.:  [id number]          PATIENT TYPE:  EMS   LOCATION:  ED                            FACILITY:  [facility]   PHYSICIAN:  Calvert Cantor, M.D.     DATE OF BIRTH:  [birth year]   DATE OF ADMISSION:  02/16/2005  DATE OF DISCHARGE:  LH                                HISTORY & PHYSICAL   PRESENTING COMPLAINT:  Muscle spasm and fall times two today.   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  He is on high  doses of Neurontin and Baclofen at home and states that his symptoms kind of  wax and wane.  Today he was walking out of his house going to his car where  he fell off to the right side.  At that time his wife was present.  She  helped him up, however, on the way back home again he fell to the right  side.   His symptoms started about one year ago when he had a work related injury  and injured his knee after which he developed a pain in his spine while  laying in bed and at that time all of his symptoms started which included  headaches, burning and tingling in the side of his face, spasms of various  muscle groups, weakness of various parts of his body and numbness and  tingling in various parts of his extremities as well.  He states that  sometimes sharp noises bring on spasms.  At times he improves to the point  where he can walk with a cane, however, at other times he says that he is  unable to walk.  He has been undergoing physical therapy lately and he  states that he seems to be deteriorating with his physical therapy.   In the past he has had EMGs done.  I have some medical records from [facility] stating that  nerve conduction velocity tested in November 28, 2004 was a normal study.  The nerve conduction study was done on  bilateral superficial cranial sensory responses, bilateral peroneal and  tibial motor responses.  It also goes on to say that his reflexes were  normal.  EMG testing of the left upper and lower extremity and the left  cervical paraspinals was also normal.  MRI of the brain was done without  contrast in 10/2004 which was normal other than signs of chronic sinusitis.  MRI of the C-spine showed mild degenerative changes and MRI of the lumbar  spine showed congenital spinal stenosis.   Currently the patient states that he feels weak in his legs and in his arms.  While giving me his history, he states that he begins to feel spasms in his  left thigh.  Currently he has no headache.  He has no blurred vision,  however, he states that he has been seeing a  black tadpole shaped floater in  his left visual field.   PAST MEDICAL HISTORY:  Past medical history is significant for type 2  diabetes mellitus diagnosed this past summer, hypertension.  The patient had  also been diagnosed with depression and bipolar disorder.  He has a history  of alcohol abuse and cocaine abuse and he has a history of asthma as well.   MEDICATIONS:  1.  He is on Neurontin 600 mg at breakfast and at lunchtime, 1200 mg at      dinner and at bedtime.  2.  Baclofen 10 mg at breakfast and lunch, 20 mg a day at bedtime.  3.  Flonase 2 puffs daily.  4.  Metformin 500 mg b.i.d.  5.  Altace 5 mg a day.  6.  Prilosec 20 mg a day.  7.  Valium 10 mg one tab in the morning and one in the evening.   SOCIAL HISTORY:  He was a smoker.  He states that he quit about ten days  ago.  He no longer drinks alcohol.  He is married.  He lives with his wife  and two children who are alive and healthy.   ALLERGIES:  He is allergic to VICODIN AND CIPRO BOTH OF WHICH CAUSE ITCHING  AND A RASH.   FAMILY HISTORY:  His mother is alive with type 2 diabetes mellitus.  His  father and brothers and sisters are alive with no significant medical   history.   PAST SURGICAL HISTORY:  Includes an umbilical hernia repair and an  appendectomy after which the appendix was found to have a fecalith, however,  otherwise was normal.   PHYSICAL EXAMINATION:  Temperature is 98.2 degrees, blood pressure is  121/90, pulse is about 76, respiratory rate 18 to 20, pulse ox 97% on room  air.  Pain was initially a 10 out of 10 dropping down to a 3 out of 10 after  being given Dilaudid and Ativan.  HEENT:  Atraumatic, normocephalic.  Pupils  equal, round, reactive to light and accommodation.  Extraocular movements  are intact.  Mucosa is moist.  There is no icterus, conjunctivae not pink.  Neck is supple.  Heart:  Regular rate and rhythm.  Lungs are clear  bilaterally.  Abdomen is soft, nontender, nondistended, bowel sounds are  positive.  Extremities:  No cyanosis, clubbing or edema.  Neurologically,  cranial nerves 2 through 12 appear to be intact.  Strength:  Lower  extremities:  The patient is unable to lift his bilateral lower extremities  off the bed more than 2 inches, however, he is able to move them to the  right and left with much effort.  Upper extremities:  His right upper  extremity appears to be weaker than his left.  He has weakness in grasping  my fingers.  I would give his right upper extremity a 4 out of 5 and his  left is 4 out of 5.  I notice that in his history he stated that he felt  spasms in his left thigh.  Upon examination, his left quadriceps femoris was  twitching after which I noticed some twitches in his right as well.  Also  while he was giving me his history his left hand was trembling, however,  later while drinking soda, it was no longer trembling.   Blood work:  White count is 8.4, hemoglobin is 15.2, hematocrit 43.2, MCV  90, platelets 236, sodium 140, potassium 3.7, chloride 103, bicarb 23,  glucose  90, BUN 11, creatinine 0.9, calcium 9.4, AST 49, ALT 114, alkaline phosphatase 69, total bili 0.6, albumin 4.4,  total protein 7.2.  UA is  positive for benzodiazepine.  CK is 98.   X-ray of the lumbar spine shows no evidence of acute injury.  CT of the head  shows no intracranial abnormalities.  This was done without contrast.   ASSESSMENT AND PLAN:  This is a 35 year old white male with multiple  neurological complaints for the past one year now.  He has been referred to  a psychiatrist and placed on antidepressants, however, he has stopped taking  them stating that he does not believe that he has depression.  Currently he  is on high doses of Baclofen, Valium, and Neurontin to help control his  neurological complaints which seem to wax and wane.  He is being admitted  today after two falls.  The ER doctor states that he came into the ER  yesterday with also complaint of spasms, however, was sent home at that  time.  I am going to admit him and order a neurological consult with Dr.  Gerilyn Pilgrim.  I will resume his home medications and give him additional pain  relief with IV Dilaudid if necessary.   His LFTs are elevated which he also has a history of.  He has a history of  alcohol abuse which he states that he has stopped.  We will follow-up these  LFTs tomorrow.  I have ordered PT/OT and resume his home medications.   For his diabetes, I will place him on an insulin sliding scale and resume  his Glucophage and get a hemoglobin A1C.  I will also get a TSH.      SR/MEDQ  D:  02/16/2005  T:  02/16/2005  Job:  [id number]   cc:   Darleen Crocker A. Gerilyn Pilgrim, M.D.  [address], [place]  [place]  [place] [address]  Fax: [phone]

## 2011-03-28 NOTE — Assessment & Plan Note (Signed)
[facility]                             PULMONARY OFFICE NOTE   NAME:[name], [name]                     MRN:          [id number]  DATE:10/21/2006                            DOB:          [birth year]    HISTORY OF PRESENT ILLNESS:  Patient is a 35 year old white male who I  have been asked to see for difficulty sleeping.  Patient recently  underwent nocturnal polysomnography, where, unfortunately, he only slept  183 minutes.  However, during this time, he only had 12 hypopneas for a  fairly normal respiratory disturbance index.  Certainly, I could not  exclude more severe sleep apnea if he had slept longer and gotten more  REM.  He did not have any leg jerks.  The patient states that he  typically goes to bed between 9 and 11 at night, and watches TV in  bed  in order to fall asleep.  His wife frequently turns this off at 1 a.m.  He takes Lexapro, amitriptyline, and baclofen at bedtime.  Patient  suffers from chronic pain syndrome with muscle spasms and cramps  throughout his body that is greatly disrupting his sleep.  He will  awaken over and over throughout the night, and turn the TV back on  again.  His sleep is quite fragmented.  He would typically get up  between 9 and 10 to start his day, and, unfortunately, takes frequent  naps during the day.  He also drinks about 2 liters of [facility], but it is not non-caffeinated.  He denies any intake of coffee or  tea.   PAST MEDICAL HISTORY:  1. Significant for hypertension.  2. History of diabetes.  3. History of dyslipidemia.  4. History of allergic rhinitis.  5. Status post appendectomy.  6. History of chronic pain syndrome involving muscle spasms.   CURRENT MEDICATIONS:  1. Nasonex 2 sprays in each nare daily.  2. Niaspan 100 mg nightly.  3. Citalopram 20 mg daily.  4. Lisinopril 20 mg daily.  5. Actos 15/500 b.i.d.  6. Lyrica 150 b.i.d.  7. Baclofen 20 mg t.i.d.  8.  Amitriptyline 50 mg at h.s.  9. __________ 1 q.i.d.  10.Methocarbamol 75 mg daily.  11.Diazepam.  12.Tramadol p.r.n.   ALLERGIES:  THE PATIENT IS INTOLERANT OR ALLERGIC TO CIPRO AND VICODIN.   SOCIAL HISTORY:  He is married and has children.  He has not smoked  since October 2007.   FAMILY HISTORY:  Unremarkable for first degree relatives.   REVIEW OF SYSTEMS:  As per history of present illness.  Also see patient  intake form documented on the chart.   PHYSICAL EXAMINATION:  GENERAL:  He is an obese, white male in no acute  distress.  Blood pressure is 114/78, pulse 95, temperature 97.7.  Weight is 252  pounds.  O2 saturation on room air is 97%.  HEENT:  Pupils are equal, round, and reactive to light and  accommodation.  Extraocular muscles are intact.  Nares showed septal  deviation to the left, but patent oropharynx shows mild elongation of  soft palate  and uvula.  NECK:  Supple without JVD or lymphadenopathy.  There is no palpable  thyromegaly.  CHEST:  Totally clear to auscultation.  CARDIAC: Exam reveals regular rate and rhythm, no murmurs, rubs, or  gallops.  ABDOMEN:  Soft, nontender.  Good bowel sounds.  GENITAL EXAM, RECTAL EXAM, BREAST EXAM:  Not done and not indicated.  Lower extremities are without edema.  Good pulses distally with no calf  tenderness.  NEURO:  Alert and oriented with no obvious motor deficits.   IMPRESSION:  Inadequate sleep hygiene that is resulting from sleep  disruption from his chronic pain syndrome.  This has also developed into  a psychophysiologic insomnia.  I think at this point in time, we need to  work aggressively on his sleep hygiene, and will depend on his primary  care physician to try and better control his chronic pain syndrome.   PLAN:  1. Will discontinue amitriptyline and place on trazodone since it is      more sedating at 50 mg nightly.  We will also add Rozerem 8 mg      nightly to help with reestablishing circadian  rhythm.  2. I have talked with the patient about his sleep hygiene, including      not watching TV ever in bed, or reading.  He is also not to stay in      bed if he cannot get to sleep within 20-30 minutes, and is to come      out in the family room and watch TV under low light, or read.  He      is to do nothing active during the middle of the night.  He is to      return to bed once he starts getting sleepy, but be willing to come      back out as many times as it takes if he is not able to reestablish      sleep.  Patient is also to set his alarm clock for 8 a.m. and get      up each day at that time regardless of the number of hours that he      has slept the night before.  He is also to have no napping during      the day, and no caffeine at all.  3. The patient will followup in approximately 3-4 weeks or sooner if      there are problems.     Barbaraann Share, MD,FCCP  Electronically Signed    [facility]/MedQ  DD: 10/21/2006  DT: 10/21/2006  Job #: [id number]   cc:   Franchot Heidelberg, M.D.

## 2011-03-28 NOTE — H&P (Signed)
[name], [name]              ACCOUNT NO.:  [id number]   MEDICAL RECORD NO.:  [id number]          PATIENT TYPE:  INP   LOCATION:  5731                         FACILITY:  [facility]   PHYSICIAN:  Madeleine B. Vanstory, M.D.DATE OF BIRTH:  July 14, 1976   DATE OF ADMISSION:  01/05/2005  DATE OF DISCHARGE:                                HISTORY & PHYSICAL   CHIEF COMPLAINT:  Spasms everywhere, arms, legs, x1 day.   This is a 35 year old Caucasian male with substance abuse history and psych  history with chronic spasms x6-7 months with increased intensity x1 day.  The patient is followed by a neurologist, Dr. Joseph Berkshire who has prescribed  Neurontin, Flexeril, and Baclofen.  A week ago, the patient saw Dr. Joseph Berkshire who  changed Flexeril to Baclofen and increased Neurontin secondary to spasms.  The patient denies anxiety history.  The patient denies infection, drug use.  The patient admits to compliance with medication.  No history of seizures.  MRI in September 2005 within normal limits.  EMGs within normal limits.  The  patient claims precipitating factor for these chronic spasms began when he  injured his leg and was put on Indocin and then felt a pop in his neck a  couple days after Indocin treatment and with subsequent stinging and burning  down his neck.  The patient has been seen by Dr. Leveda Anna multiple times for  this.  He referred him to Dr. 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   REVIEW OF SYSTEMS:  Negative for fever, chills, weight change, chest pain,  emesis, rash.  PSYCH:  The patient positive for decreased memory, increased  weakness, positive nausea.   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   MEDICATIONS:  1.  Baclofen 10 mg p.o. t.i.d.  2.  Neurontin 300 mg p.o. q.i.d.  3.  Flonase two puffs q.i.d.  4.  Metformin 500 mg p.o. q.a.m., 1000 mg p.o. q.h.s.  5.  Nortriptyline 25 mg p.o. t.i.d.  6.  Voltaren XR 100 mg p.o. daily.  7.  Advair 250/50 one puff daily.  8.  Prilosec 20 mg p.o. daily.   ALLERGIES:  VICODIN.   The patient had a recent course of prednisone and Levaquin January 2006.   PROCEDURE:  Appendectomy in March 2005.  Umbilical hernia repair March 2005.   FAMILY HISTORY:  History of depression.  Negative history of nerve disorder.   SOCIAL HISTORY:  Smokes half per day x several years.  The patient no longer  ETOH abuse, but history of substance abuse in his past.   PHYSICAL EXAMINATION:  VITAL SIGNS:  Heart rate 89-92, temperature current  96.5, temperature max 97.5, systolic 123-149/89-100, respirations 18-20,  100% on room air.  GENERAL:  The patient not easily aroused secondary to Dilaudid and Ativan.  On second examination, the patient was alert and oriented x3.  HEENT:  Pupils equal, round, reactive to light.  Sluggish to react.  No  scleral icterus.  Moist mucous membranes. Nares patent.  No rhinorrhea.  No  lymphadenopathy.  No thyromegaly.  LUNGS:  Clear to auscultation bilaterally.  No increased work of breathing.  HEART:  Regular rate and rhythm.  No murmurs, rubs or gallops.  No edema.  EXTREMITIES:  Tattoo on his right lower leg.  ABDOMEN:  Positive bowel sounds.  Mild distention.  Nontender to palpation.  NEUROLOGIC:  Cranial nerves II-XII intact.  No visual drift, no facial  deficit.  Positive global weakness in musculoskeletal bilaterally upper and  lower extremities.   LABORATORY DATA:  Lytes all within normal limits.  AST elevated at 52, ALT  123.  White count 9.2, hemoglobin 15.2, 50% neutrophils.  D-dimer less than  0.22.  UA negative.  Pan negative.  CK 194.   ASSESSMENT/PLAN:  This is a  35 year old male with substance abuse history  and psych history admitted for:  1.  Spasms, chronic condition followed by neurologist.  Recent change of      medication after spasm became constant.  No seizure.  No infection.  The      patient denies any ETOH or drug use.  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  Labs values to assess are      pending at neurologist office.  The patient not currently having spasm      status post Ativan.  Wonder if ETOH withdrawal component.  Will check a      ETOH and UDS. Diabetic neuropathy does not particularly present this      way.  The patient denies history of migraines.  No vertigo.  This      atypical presentation in psych history.  Will admit for 23-hour      observation and discuss with Dr. Joseph Berkshire for further evaluation as he knows      the patient.  No known focal neurological deficits.  Also discussed with      primary MD who has addressed the possibility of psychological component      per RN who witnessed these spasms she said they appear self-induced      and they were not constant.  2.  Elevated LFTs.  This could be secondary to ETOH.  Will check ETOH level.      Could be secondary to Flexeril, which patient most recently DC'd or      changed to Baclofen.  Could be a fatty diet, the patient eat fast food      regularly.  Could also be secondary to muscle breakdown if these were in      fact spasms.  3.  Diabetes stable.  Will hold metformin sliding scale insulin.  4.  Psych.  Will discuss with primary MD need for psych evaluation.      MBV/MEDQ  D:  01/06/2005  T:  01/06/2005  Job:  [id number]

## 2011-03-28 NOTE — Op Note (Signed)
NAME:  [name], [name]                        ACCOUNT NO.:  [id number]   MEDICAL RECORD NO.:  [id number]                   PATIENT TYPE:  OBV   LOCATION:  0457                                 FACILITY:  [facility]   PHYSICIAN:  Abigail Miyamoto, M.D.              DATE OF BIRTH:  [birth year]   DATE OF PROCEDURE:  01/25/2004  DATE OF DISCHARGE:                                 OPERATIVE REPORT   PREOPERATIVE DIAGNOSES:  1. Abdominal pain of uncertain etiology.  2. Umbilical hernia.   POSTOPERATIVE DIAGNOSES:  1. Abdominal pain of uncertain etiology.  2. Umbilical hernia.   OPERATION/PROCEDURE:  1. Diagnostic laparoscopy.  2. Laparoscopic appendectomy.  3. Umbilical hernia repair.   SURGEON:  Abigail Miyamoto, M.D.   ANESTHESIA:  General endotracheal anesthesia and 0.25% Marcaine.   ESTIMATED BLOOD LOSS:  Minimal.   INDICATIONS:  [name] is a 35 year old gentleman who has had severe  lower quadrant abdominal pain, worse on the right.  He has also had  tenderness and guarding on the right.  This has been intermittent.  He has  had a CT scan which showed some kind of appendicolith but no other findings  of acute appendicitis.  He has had no other abnormalities.  On examination,  he does have an umbilical hernia but no evidence of anal hernias.  Given  these findings, decision was made to proceed with diagnostic laparoscopy.   FINDINGS:  The patient was found to have a normal-appearing appendix.  It  was removed.  There was no evidence of inguinal hernias on either side and  there was a small umbilical hernia which was repaired.   DESCRIPTION OF PROCEDURE:  The patient was brought to the operating room,  identified as [name].  He was placed supine on the operating table  and general anesthesia was induced.  His abdomen was prepped and draped in  the usual sterile fashion.   Using a #15 blade, a small transverse incision was made just at the  umbilicus.  Incision was  carried down to the fascia which was then opened  with the scalpel.  Hemostasis was used to pass through the peritoneal  cavity.  A 0 Vicryl pursestring suture was then placed around the fascia  opening.  The Hasson port was placed through the oblique and visualization  of the abdomen was begun.  A 5 mm port was then placed in the patient's  right upper quadrant under direct vision.  The entire abdominal cavity was  then evaluated.  The entire small bowel and colon appeared normal.  The  appendix also appeared normal.  Liver and gallbladder were likewise normal.  Attention was then turned towards the pelvis.  Both inguinal areas were  examined and no evidence of hernia was identified on either side.  At this  point the decision was made to proceed with appendectomy.   A 12 mm port was placed in the  patient's midline just above the pubis.  The  appendix was then elevated and the appendiceal base was easily dissected  free.  It was then transient to the laparoscopic GIA stapler.  The  appendiceal stump appeared to be hemostatic.  The mesoappendix was then  taken down with the Harmonic scalpel.  Once the appendix was completely  removed, it was placed in an endosac and removed through the incision at the  umbilicus.  Again the appendiceal stump and area of the dissection of the  mesoappendix were evaluated and hemostasis appeared to be achieved.  The  abdomen was then irrigated with normal saline.  The rest of the abdomen was  again evaluated and no other abnormalities were identified.  The patient  did, however, have a small umbilical hernia.  At this point all ports were  removed and the abdomen was deflated.  The fascia opening was joined to the  opening at the umbilical hernia.  The fascia was then exposed  circumferentially and closed with interrupted figure-of-eight #1 Novofil pop-  off sutures.   All incision sites were anesthetized with 0.25% Marcaine.  The umbilicus was  tacked in  place with a 3-0 Vicryl suture.  Subcutaneous layer was closed  with a 3-0 Vicryl suture.  Subcutaneous layer was closed with interrupted 3-  0 Vicryl sutures and skin was closed with running 4-0 Monocryl.  The other  ports were likewise closed with 4-0 Monocryl.  The appendix was sent to  pathology for identification.  The patient tolerated the procedure well.  All sponge, needle and instrument counts were correct at the end of the  procedure.  The patient was then extubated in the operating room and taken  in stable condition to the recovery room.                                               Abigail Miyamoto, M.D.    DB/MEDQ  D:  01/25/2004  T:  01/25/2004  Job:  [id number]

## 2011-03-28 NOTE — Discharge Summary (Signed)
[name], [name]              ACCOUNT NO.:  [id number]   MEDICAL RECORD NO.:  [id number]          PATIENT TYPE:  INP   LOCATION:  A302                          FACILITY:  [facility]   PHYSICIAN:  Timothy Shipper, MD     DATE OF BIRTH:  01-01-1976   DATE OF ADMISSION:  02/16/2005  DATE OF DISCHARGE:  04/11/2006LH                                 DISCHARGE SUMMARY   PRIMARY CARE DOCTOR:  [name], M.D., out of [facility].   Please review H&P dictated on February 17, 2004, for details regarding the  patient's presenting illness.   BRIEF HISTORY:  Briefly, this is a 35 year old white male with a past  medical history of diabetes and an extensive history of muscle spasms and  weakness which started in June of 2005.  In June, he had a work injury where  he tripped at work and had right knee strain.  Ever since then, the patient  has been complaining of muscle spasms, back pain, weakness, visual  difficulties and headaches.   The patient has been worked up extensively by multiple neurologists,  including Dr. Pearlean [name] at [facility], as well as at [facility].  This extensive workup include MRI of his  brain and of his cervical and lumbar spine, as well as EMGs.  None of these  have produced any definite diagnosis for this patient.  At one point, a  diagnosis of stiff-man syndrome was being considered, however, according to  my discussions with Dr. Pearlean [name] that is also unlikely in this patient.   HOSPITAL COURSE:  The patient presented to [facility] with  complaints of muscle spasms and fall.  He gives a history of worsening of  his lower extremity weakness, right side more than the left, over the past  one month.  The patient underwent a CT scan of his head and x-rays of his  spine, which did not show any acute event.  Since the patient has had an  extensive neurologic workup, we did not further initiate this during  his  hospital stay here.  We had physical therapy come and look at the patient,  however, the patient is finding it difficult to stand to comply with  physical therapy.  The patient needs to be seen by a neurologist to rule out  any new neurological process.  However, Dr. Gerilyn [name], who is the staff  neurologist at [facility], is on vacation for a week.  No other neurologist  is available to see the patient in this hospital.  Considering all of this,  I spoke with Dr. Pearlean [name], who said he would be willing to see the patient in  consult if the patient is transferred to [facility].  Based on the  discussion, I also spoke with Dr. Leveda [name] out of family practice, who has  seen the patient in the past.  However, apparently the patient had requested  transferring out of their practice to Dr. Edwin [name] practice.  Subsequently I  spoke to [name], M.D., of [facility], who has  accepted this patient for transfer.  This patient needs to be seen by Dr.  Pearlean [name] once he arrives at [facility].   TRANSFER MEDICATIONS:  The patient needs to be continued on the following:  1.  Diazepam 10 mg p.o. b.i.d.  2.  Flonase one spray to each nostril daily.  3.  Neurontin 1200 mg with dinner and q.h.s. and 600 mg at breakfast and      lunch.  4.  Baclofen 10 mg at breakfast and lunch and 20 mg with dinner and q.h.s.  5.  Metformin 500 mg p.o. b.i.d.  6.  Nicotine patch 14 mg to be applied topically daily.  7.  Protonix 40 mg p.o. daily.  8.  Altace 5 mg p.o. daily.  9.  Restoril 15 mg p.o. q.h.s.  10. Tylenol 325 mg p.o. q.4h. p.r.n. for pain.   Please note that these are transfer medications.  The patient might be  prescribed new medications on discharge from [facility].   DIET:  The patient may eat an 1800 calorie ADA diet.   Physical activity and further disposition will be decided on patient  discharge from [facility].      GK/MEDQ  D:  02/18/2005  T:  02/18/2005  Job:   [id number]   cc:   [name], M.D.   Pramod P. Timothy Brownie, MD  Fax: [phone]   [name], M.D.  [facility]

## 2011-03-28 NOTE — Discharge Summary (Signed)
[name], [name]              ACCOUNT NO.:  [id number]   MEDICAL RECORD NO.:  [id number]          PATIENT TYPE:  INP   LOCATION:  6727                         FACILITY:  [facility]   PHYSICIAN:  Hettie Holstein, D.O.    DATE OF BIRTH:  May 19, 1976   DATE OF ADMISSION:  02/18/2005  DATE OF DISCHARGE:                           DISCHARGE SUMMARY - REFERRING   PRIMARY CARE PHYSICIAN:  [facility]   ADMISSION DIAGNOSIS:  Muscle weakness, fatigue and pain syndrome,  transferred from [facility] from the service of Dr. Rito Ehrlich,  status post thorough outpatient evaluation by neurologist, Dr. Pearlean Brownie in  addition to a specialist in [facility] as well as Dr. Darleen Crocker A.  [place] in [facility] in [place].   DISCHARGE DIAGNOSES:  1.  Pain syndrome.   1.  Ataxia, etiology not completely clear after thorough evaluation this      hospitalization including two neurologist consultations with Dr. Pearlean Brownie      in addition to Dr. Anne Hahn with clinical impressions that there is no      organic etiology to explain [name]'s symptoms. In review of his      extensive evaluation it revealed he has had normal electromyelogram      studies in additiona to a normal MRI of the brain as well as had an MRI      lumbar evaluation in the outpatient setting prior to arriving. Again, he      has undergone neurology consultations and a discussion with      rheumatologist, Dr. Jimmy Footman has taken place without conclusive      findings to pursue. 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      Significance of this is not absolutely clear as it is possible for this      to be positive in patients with diabetes, specifically diabetes type I.      He has undergone further testing here including an HIV which was      nonreactive, ENA in addition to other rheumatologic studies that were  unremarkable. Anti Jo-1 antibody as well as aldolase which was 6.9 both      fell within normal range. In addition he has undergone ANA titer which      is negative as well as RPR, in addition to ammonia levels, acute      hepatitis panels additionally were negative. His sed rate was 4 on      admission. Creatinine kinase was 98. Liver synthetic function was      normal. He did have a mildly elevated AST and ALT with an AST of 49 and      ALT of 111. However it was evident on his CT scan of his abdomen and      pelvis performed January 31, 2005 that he did have diffuse fatty      infiltration of his liver and it felt that this may be attributable to      fatty liver. However he does  have a gastroenterologist following his      liver function tests. He had been on Glucophage, this was discontinued      this admission. He was started on Avandia with recommendations to follow      up his LFTs within 1 month and he is instructed to continue his Accu-      Chek's.  His Hemoglobin A1C this admission was 5.8.   1.  Urinary retention. During this admission [name]'s renal function has      remained within normal limits. He continues to complain of a sensation      of urinary retention. He did undergo post-voiding residual that revealed      urinary residual, however [name] is on multiple medications at      present including Dilaudid, Valium as well as Neurontin and he has      complained of constipation at this time. We are presently recommending      aggressive bowel regimen and his wife is being instructed on p.r.n.      urinary catheterization's at home q.4h. He does have a follow up      appointment with a urologist.   1.  Diabetes mellitus. As noted above his hemoglobin A1C was 5.8. He is      being discharged on Avandia 4 mg daily with recommendation to have      follow up of his liver function tests.   1.  Hypertension. [name] is asked to continue his Altace as he was at       home. [name] has a serum protein electrophoresis pending to assess      his levels of immunoglobulins. 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 Otherwise he should      continue as an outpatient to pursue further testing and evaluation as      recommended by neuromuscular specialists at [facility] to which he has been      referred.   TESTS PERFORMED THIS ADMISSION:  As described above, multiple laboratory  testing including MRI of the brain; for full details please refer to the  above aforementioned studies.   </HISTORY OF PRESENTING ILLNESS>/  For Full details please refer to the full H&P as dictated by Dr. [name]. 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  He stated the weakness had been worsening over the course  of the previous few weeks. He was admitted to [facility] with  complaints of worsening muscle spasms and falls over the previous 2 days. He  indicated that he had an extensive work up by Dr. Pearlean Brownie, and a second  opinion referral at [facility] with records revealing an examination  without objective findings in February. A nonfocal exam was recorded at the  time. No other abnormalities in review of prior records of suggestive of  organic pathology.  There was one minimally eleveted Anti GAD65 (2.7/ml)  antibody study that was done through Dr. [name] clinic. Significance of  this finding was uncertain and Dr. Pearlean Brownie was asked to accept the patient in  transfer.  HOSPITAL COURSE:  [name] course was that of multiple and varied complaints. He has complained of muscle  spasms without clear etiology. His  metabolic evaluation was unremarkable with normal electorlytes and mild LFT  elevation as described above. The patient was prescribed Valium with  moderate to marked success and improvement in ambulation and spasm  frequency.  Rheumatology as well as psychiatry were requested to assist in  evaluation. Psychiatry evaluated Mr. 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  However the patient declined these services. Rheumatology in presentation of  the current objective findings did not feel compelled to pursue any further  testing with exception of a few serologic tests that I had ordered and have  received which are again not indicative of organic pathology.  Only pending  is the SPEP which if unremarkable will assist in further management.   MEDICATIONS ON DISCHARGE:  1.  Valium 5 to 10 mg q.3h p.r.n. for spasms. He is instructed not to take      this if he is feeling drowsy or sleepy.  2.  Dilaudid 2 mg q.4-6h p.r.n. He is instructed to use this very sparingly.      Dispense total count of #30 without refill.  3.  Nicotine patch 14 mg/24h. Patient has his own prescriptions.  4.  Altace 2.5 mg daily as before.  5.  New medication - Avandia 4 mg daily with instruction to check his labs      in 1 month for LFT abnormalities, in addition follow up with his      gastroenterologist as previously directed.  6.  Neurontin 1200 mg each evening. He was tapering this slowing during his      hospitalization. Recommended continue to taper this in outpatient      setting as again, no objective findings were discovered to suggest      organic disease.  7.  Senokot 2 tablets daily. Instructed to hold if he develops diarrhea.  8.  Dulcolax suppositories 2 each evening p.r.n. constipation.   DISPOSITION:  He is being discharged home with a wheeled walker and  outpatient rehabilitation 3 days a  week x4-6 weeks. He is instructed to  remain on a consistent carbohydrate diet for urinary retention. He is  instructed to perform straight catheterization's q.4h p.r.n. for urinary  retention and to follow up with his urologist as previously directed. He is  to call [facility] specialists at [phone], Dr. Eudelia Bunch [phone]) or Dr. Allyne Gee as referred by Dr. Nedra Hai of [facility]  neurology. In addition he should call Scott Long for a follow up appointment  this week. He should continue his previous scheduled appointments with  [facility] as well as his urologist and gastroenterologist.   At this time there is no conclusive or objective findings to suggest organic  pathology from this hospitalization for Mr. Maxamillion Banas. His case has  been quite challenging and I suspect that continued supportive care, perhaps  counseling, will assist him in his recovery.  I do believe that a visit to a neuromuscular specialist is beneficial to him and counseling as recommended  by Dr. Jeanie Sewer in addition is highly recommended.   I've been asked by Dr. Trudi Ida office to provide reference and summary of  this hospitalization.  I will request a CC and fax to Dr. Geronimo Running office.  Mr. Vandervelden can be reached at [phone] or [phone].  ESS/MEDQ  D:  02/23/2005  T:  02/23/2005  Job:  [id number]   cc:   [name], MD  Fax: [phone]   Kofi A. Gerilyn Pilgrim, M.D.  [address], [place]  [place]  [place] [address]  Fax: [phone]

## 2011-04-09 ENCOUNTER — Telehealth: Payer: Self-pay | Admitting: Family

## 2011-04-09 MED ORDER — NIACIN-SIMVASTATIN ER 1000-20 MG PO TB24: 1.0000 | ORAL_TABLET | Freq: Every day | ORAL | Status: DC

## 2011-04-09 MED ORDER — OMEGA-3-ACID ETHYL ESTERS 1 G PO CAPS: ORAL_CAPSULE | ORAL | Status: DC

## 2011-04-09 NOTE — Telephone Encounter (Signed)
Pharmacy comments:  Pt needs refills on Lovaza and Simcor. We have never filled these for him, so we need complete rx's.

## 2011-04-11 ENCOUNTER — Encounter: Payer: Self-pay | Admitting: Family

## 2011-06-18 ENCOUNTER — Ambulatory Visit: Payer: No Typology Code available for payment source | Admitting: Family

## 2011-06-29 ENCOUNTER — Other Ambulatory Visit: Payer: Self-pay | Admitting: Family

## 2011-06-30 NOTE — Telephone Encounter (Signed)
Pt was due for follow up earlier this month. No future appts on file. Please call pt and arrange follow up.

## 2011-06-30 NOTE — Telephone Encounter (Signed)
Patient made appt for 07-01-11

## 2011-07-01 ENCOUNTER — Telehealth: Payer: Self-pay | Admitting: *Deleted

## 2011-07-01 ENCOUNTER — Ambulatory Visit: Payer: No Typology Code available for payment source | Admitting: Family

## 2011-07-01 NOTE — Telephone Encounter (Signed)
Received call from pt's wife stating they needed to r/s today's appt as they do not have the gas money. She asks if they can reschedule after 07/14/11 when pt will get paid again. Appt rescheduled for 07/16/11 @ 2:15pm. Advised her that Cymbalta 30 day supply was sent to pharmacy yesterday but pt will need to be seen before we can give additional refills and she voices understanding.

## 2011-07-03 ENCOUNTER — Other Ambulatory Visit: Payer: Self-pay | Admitting: Anesthesiology

## 2011-07-03 DIAGNOSIS — M542 Cervicalgia: Secondary | ICD-10-CM

## 2011-07-09 ENCOUNTER — Other Ambulatory Visit: Payer: No Typology Code available for payment source

## 2011-07-12 ENCOUNTER — Other Ambulatory Visit: Payer: No Typology Code available for payment source

## 2011-07-14 ENCOUNTER — Other Ambulatory Visit: Payer: Self-pay | Admitting: Family

## 2011-07-16 ENCOUNTER — Ambulatory Visit: Payer: No Typology Code available for payment source | Admitting: Family

## 2011-07-17 ENCOUNTER — Other Ambulatory Visit: Payer: No Typology Code available for payment source

## 2011-07-22 ENCOUNTER — Encounter: Payer: Self-pay | Admitting: Family

## 2011-07-22 ENCOUNTER — Ambulatory Visit (INDEPENDENT_AMBULATORY_CARE_PROVIDER_SITE_OTHER): Payer: No Typology Code available for payment source | Admitting: Family

## 2011-07-22 ENCOUNTER — Ambulatory Visit (HOSPITAL_BASED_OUTPATIENT_CLINIC_OR_DEPARTMENT_OTHER)
Admission: RE | Admit: 2011-07-22 | Discharge: 2011-07-22 | Disposition: A | Discharge status: Home / Self Care | Payer: No Typology Code available for payment source | Source: Ambulatory Visit | Attending: Family | Admitting: Family

## 2011-07-22 VITALS — BP 136/86 | HR 112 | Temp 98.1°F | Resp 18 | Wt 249.1 lb

## 2011-07-22 DIAGNOSIS — R062 Wheezing: Secondary | ICD-10-CM

## 2011-07-22 DIAGNOSIS — R55 Syncope and collapse: Secondary | ICD-10-CM

## 2011-07-22 DIAGNOSIS — R059 Cough, unspecified: Secondary | ICD-10-CM

## 2011-07-22 DIAGNOSIS — R918 Other nonspecific abnormal finding of lung field: Secondary | ICD-10-CM | POA: Insufficient documentation

## 2011-07-22 DIAGNOSIS — J189 Pneumonia, unspecified organism: Secondary | ICD-10-CM

## 2011-07-22 DIAGNOSIS — R0902 Hypoxemia: Secondary | ICD-10-CM

## 2011-07-22 DIAGNOSIS — R05 Cough: Secondary | ICD-10-CM

## 2011-07-22 LAB — BASIC METABOLIC PANEL
Calcium: 9.5 mg/dL (ref 8.4–10.5)
Chloride: 99 mEq/L (ref 96–112)
Creat: 0.87 mg/dL (ref 0.50–1.35)

## 2011-07-22 MED ORDER — ALBUTEROL 90 MCG/ACT IN AERS: 2.0000 | INHALATION_SPRAY | Freq: Four times a day (QID) | RESPIRATORY_TRACT | Status: DC | PRN

## 2011-07-22 MED ORDER — CEFDINIR 300 MG PO CAPS: 300.0000 mg | ORAL_CAPSULE | Freq: Two times a day (BID) | ORAL | Status: AC

## 2011-07-22 MED ORDER — PREDNISONE 10 MG PO TABS: ORAL_TABLET | ORAL | Status: DC

## 2011-07-22 MED ORDER — AZITHROMYCIN 250 MG PO TABS: ORAL_TABLET | ORAL | Status: AC

## 2011-07-22 NOTE — Assessment & Plan Note (Signed)
CXR notes probable RLL pneumonia. Will treat with azithromycin and omnicef.  Due to significant wheezing, I also think that he needs some oral steroids.  Will treat with prednisone taper.    Initially attempted a CTA chest to evaluate for PE/pneumonia.  As pt was diabetic, it is required that he have creatinine within 6 weeks. This would not be available during office hours.  D. Dimer ordered.  If negative will plan to cancel CTA.  If + he will proceed with CTA chest tomorrow AM at 9:00.  Discussed plan with patient and he verbalizes understading.

## 2011-07-22 NOTE — Patient Instructions (Signed)
Check sugars 3 times daily while on prednisone. Call if >300. Follow up on Friday, call if fever over 101, increased wheezing.  Go to ER if recurrent fainting spell.

## 2011-07-22 NOTE — Progress Notes (Signed)
Subjective:    Patient ID: [name], male    DOB: Feb 08, 1976, 35 y.o.   MRN: [id number]  HPI  Mr.  Gassett is a 35 yr old amel who presents today with chief complaint of cough.   Initially cough was mild.  Now it is in the 'upper part" of the lungs.  Has green, thick sputum.  Has been using his child's albuterol inhaler.  This is helping some with his breathing.  Notes some shortness of breath.  He has become hoarse.  Mild sinus pressure but no drainage.  He reports an episode of "blacking out while in the kitchen during a coughing fit."  He had a real bad HA following the syncopal event on Saturday.  He was apparently out only briefly and woke up on his own.  Wife was sleeping.  He denies chest pain, calf pain or swelling. He denies any recent long car trips.    Review of Systems See HPI  Past Medical History  Diagnosis Date  . Allergy   . Asthma   . Depression   . GERD (gastroesophageal reflux disease)   . Hypertension   . Diabetes mellitus     Type II  . Hyperlipidemia   . Chronic pain disorder     Sees [facility]  . Urinary incontinence     detrusor instability  . Hypogonadism     History   Social History  . Marital Status: Married    Spouse Name: N/A    Number of Children: 2  . Years of Education: N/A   Occupational History  . Disabled     chronic pain   Social History Main Topics  . Smoking status: Current Some Day Smoker -- 0.3 packs/day    Types: Cigarettes  . Smokeless tobacco: Never Used  . Alcohol Use: No  . Drug Use: No     stopped cocaine--2003. Normal drug screen March 2008  . Sexually Active: Not on file   Other Topics Concern  . Not on file   Social History Narrative  . No narrative on file    Past Surgical History  Procedure Date  . Appendectomy   . Hernia repair   . Nasal sinus surgery 2008    Family History  Problem Relation Age of Onset  . Diabetes Mother   . Hypertension Mother   . Cirrhosis Mother   .  Arthritis Father 49    osteoarthritis  . Diabetes Sister     borderline  . Heart disease Paternal Uncle 51    MI  . Heart disease Maternal Grandfather     late 60's--MI  . Cancer Paternal Grandmother 68    lung  . Heart disease Paternal Grandfather 59    MI    Allergies  Allergen Reactions  . Ciprofloxacin     REACTION: Rash and itching  . Hydrocodone-Acetaminophen     REACTION: Rash and itching    Current Outpatient Prescriptions on File Prior to Visit  Medication Sig Dispense Refill  . aspirin 81 MG tablet Take 81 mg by mouth daily.        Marland Kitchen azelastine (ASTELIN) 137 MCG/SPRAY nasal spray 2 sprays by Nasal route every morning. Use in each nostril as directed       . baclofen (LIORESAL) 20 MG tablet Take 20 mg by mouth at bedtime.        . cetirizine (ZYRTEC) 10 MG tablet Take 10 mg by mouth daily.        Marland Kitchen  ciclesonide (OMNARIS) 50 MCG/ACT nasal spray 2 sprays by Each Nare route at bedtime as needed.        . clonazePAM (KLONOPIN) 2 MG tablet Take 2 mg by mouth 2 (two) times daily as needed.        . CYMBALTA 60 MG capsule TAKE ONE CAPSULE BY MOUTH EVERY DAY  30 capsule  0  . insulin glargine (LANTUS) 100 UNIT/ML injection Inject 34 Units into the skin at bedtime.       . insulin lispro (HUMALOG) 100 UNIT/ML injection Inject 25 Units into the skin 3 (three) times daily before meals.       Dem[employer] (AUTO-LANCET) MISC Check blood sugar 4 times daily.       Marland Kitchen LOVAZA 1 G capsule TAKE 4 TABLETS BY MOUTH DAILY.  120 capsule  2  . metFORMIN (GLUCOPHAGE) 1000 MG tablet Take 1,000 mg by mouth 2 (two) times daily with meals.        . Multiple Vitamins-Minerals (MULTIVITAMIN WITH MINERALS) tablet Take 1 tablet by mouth daily.        . nabumetone (RELAFEN) 750 MG tablet Take 750 mg by mouth 2 (two) times daily.        . NON FORMULARY Inject as directed. 3 x a week.   ALLERGY INJECTIONS.       Marland Kitchen pregabalin (LYRICA) 100 MG capsule Take 100 mg by mouth 3 (three) times daily.        Marland Kitchen  SIMCOR 1000-20 MG 24 hr tablet TAKE 1 TABLET BY MOUTH AT BEDTIME.  30 tablet  2  . tadalafil (CIALIS) 5 MG tablet Take 5 mg by mouth daily as needed.        . Tapentadol HCl (NUCYNTA) 100 MG TABS 1 tablet six times a day.      . traZODone (DESYREL) 50 MG tablet Take 1/2 tablet by mouth at bedtime.         BP 136/86  Pulse 112  Temp(Src) 98.1 F (36.7 C) (Oral)  Resp 18  Wt 249 lb 1.9 oz (113 kg)  SpO2 96%       Objective:   Physical Exam  Constitutional: He appears well-developed and well-nourished.  HENT:  Head: Normocephalic and atraumatic.  Right Ear: Tympanic membrane and ear canal normal.  Left Ear: Tympanic membrane and ear canal normal.       Mild pharyngeal erythema  Neck: Normal range of motion. Neck supple.  Cardiovascular: Normal rate and regular rhythm.   Pulmonary/Chest: Effort normal. No respiratory distress. He has wheezes.  Skin: Skin is warm and dry.  Psychiatric: He has a normal mood and affect. His behavior is normal. Judgment and thought content normal.          Assessment & Plan:

## 2011-07-23 ENCOUNTER — Other Ambulatory Visit (HOSPITAL_BASED_OUTPATIENT_CLINIC_OR_DEPARTMENT_OTHER): Payer: No Typology Code available for payment source

## 2011-07-23 ENCOUNTER — Telehealth: Payer: Self-pay | Admitting: Family

## 2011-07-23 LAB — D-DIMER, QUANTITATIVE: D-Dimer, Quant: 0.25 ug/mL-FEU (ref 0.00–0.48)

## 2011-07-23 NOTE — Telephone Encounter (Signed)
D Dimer is negative.  Spoke to wife and instructed her that pt does not need to complete CT.

## 2011-07-25 ENCOUNTER — Ambulatory Visit (INDEPENDENT_AMBULATORY_CARE_PROVIDER_SITE_OTHER): Payer: Medicaid Other | Admitting: Family

## 2011-07-25 ENCOUNTER — Encounter: Payer: Self-pay | Admitting: Family

## 2011-07-25 DIAGNOSIS — E119 Type 2 diabetes mellitus without complications: Secondary | ICD-10-CM

## 2011-07-25 DIAGNOSIS — J189 Pneumonia, unspecified organism: Secondary | ICD-10-CM

## 2011-07-25 NOTE — Assessment & Plan Note (Signed)
Deteriorated due to prednisone.  He has managed to successfully titrate his short acting insulin up to keep his sugars under control.  I advised him that he can lower his dose back down as he comes off of the steroids.  He verbalizes understanding.

## 2011-07-25 NOTE — Assessment & Plan Note (Signed)
35 yr old male presents today for follow up of his pneumonia.  Clinically he is very improved today.  I recommended that he complete prednisone taper and abx, follow up in 1 week.  We will plan a follow up chest x-ray at that time.

## 2011-07-25 NOTE — Patient Instructions (Signed)
Complete your antibiotics.   Follow up at the end of next week- sooner if fever, worsening cough.

## 2011-07-25 NOTE — Progress Notes (Signed)
Subjective:    Patient ID: [name], male    DOB: Jan 28, 1976, 35 y.o.   MRN: [id number]  HPI  Mr.  Portman is a 35 yr old male who presents today for follow up of his pneumonia.  He denies SOB.  He continues to cough, but it is improved.  Productive of clear sputum.  He continues Z-pack and omnicef and tolerating without any problem except for some mild diarrhea.    Dm2-  He reports that he has been increasing his humalog to 30-35 units TID AC meals since he started the prednisone.  He notes that this has decreased his sugars from the 300's to the 200's.   Review of Systems See HPI  Past Medical History  Diagnosis Date  . Allergy   . Asthma   . Depression   . GERD (gastroesophageal reflux disease)   . Hypertension   . Diabetes mellitus     Type II  . Hyperlipidemia   . Chronic pain disorder     Sees [facility]  . Urinary incontinence     detrusor instability  . Hypogonadism     History   Social History  . Marital Status: Married    Spouse Name: N/A    Number of Children: 2  . Years of Education: N/A   Occupational History  . Disabled     chronic pain   Social History Main Topics  . Smoking status: Current Some Day Smoker -- 0.3 packs/day    Types: Cigarettes  . Smokeless tobacco: Never Used  . Alcohol Use: No  . Drug Use: No     stopped cocaine--2003. Normal drug screen March 2008  . Sexually Active: Not on file   Other Topics Concern  . Not on file   Social History Narrative  . No narrative on file    Past Surgical History  Procedure Date  . Appendectomy   . Hernia repair   . Nasal sinus surgery 2008    Family History  Problem Relation Age of Onset  . Diabetes Mother   . Hypertension Mother   . Cirrhosis Mother   . Arthritis Father 49    osteoarthritis  . Diabetes Sister     borderline  . Heart disease Paternal Uncle 46    MI  . Heart disease Maternal Grandfather     late 60's--MI  . Cancer Paternal Grandmother 91    lung  . Heart disease Paternal Grandfather 55    MI    Allergies  Allergen Reactions  . Ciprofloxacin     REACTION: Rash and itching  . Hydrocodone-Acetaminophen     REACTION: Rash and itching    Current Outpatient Prescriptions on File Prior to Visit  Medication Sig Dispense Refill  . albuterol (PROVENTIL) 90 MCG/ACT inhaler Inhale 2 puffs into the lungs every 6 (six) hours as needed for wheezing.  17 g  12  . aspirin 81 MG tablet Take 81 mg by mouth daily.        Marland Kitchen azelastine (ASTELIN) 137 MCG/SPRAY nasal spray 2 sprays by Nasal route every morning. Use in each nostril as directed       . azithromycin (ZITHROMAX Z-PAK) 250 MG tablet Take 2 tabs tonight, then one tab daily for 4 more days.  6 each  0  . baclofen (LIORESAL) 20 MG tablet Take 20 mg by mouth at bedtime.        . cefdinir (OMNICEF) 300 MG capsule Take 1 capsule (300 mg  total) by mouth 2 (two) times daily.  14 capsule  0  . cetirizine (ZYRTEC) 10 MG tablet Take 10 mg by mouth daily.        . ciclesonide (OMNARIS) 50 MCG/ACT nasal spray 2 sprays by Each Nare route at bedtime as needed.        . clonazePAM (KLONOPIN) 2 MG tablet Take 2 mg by mouth 2 (two) times daily as needed.        . CYMBALTA 60 MG capsule TAKE ONE CAPSULE BY MOUTH EVERY DAY  30 capsule  0  . insulin glargine (LANTUS) 100 UNIT/ML injection Inject 34 Units into the skin at bedtime.       . insulin lispro (HUMALOG) 100 UNIT/ML injection Inject 25 Units into the skin 3 (three) times daily before meals.       [employer] (AUTO-LANCET) MISC Check blood sugar 4 times daily.       Marland Kitchen LOVAZA 1 G capsule TAKE 4 TABLETS BY MOUTH DAILY.  120 capsule  2  . metFORMIN (GLUCOPHAGE) 1000 MG tablet Take 1,000 mg by mouth 2 (two) times daily with meals.        . Multiple Vitamins-Minerals (MULTIVITAMIN WITH MINERALS) tablet Take 1 tablet by mouth daily.        . nabumetone (RELAFEN) 750 MG tablet Take 750 mg by mouth 2 (two) times daily.        . NON FORMULARY Inject  as directed. 3 x a week.   ALLERGY INJECTIONS.       Marland Kitchen predniSONE (DELTASONE) 10 MG tablet 4 tabs by mouth daily x 2 days, then 3 tabs daily x 2 days, then 2 tabs daily x 2 days, then 1 tab daily x 2 days then stop.  20 tablet  0  . pregabalin (LYRICA) 100 MG capsule Take 100 mg by mouth 3 (three) times daily.        Marland Kitchen SIMCOR 1000-20 MG 24 hr tablet TAKE 1 TABLET BY MOUTH AT BEDTIME.  30 tablet  2  . tadalafil (CIALIS) 5 MG tablet Take 5 mg by mouth daily as needed.        . Tapentadol HCl (NUCYNTA) 100 MG TABS 1 tablet six times a day.      . traZODone (DESYREL) 50 MG tablet Take 1/2 tablet by mouth at bedtime.         BP 114/78  Pulse 102  Temp(Src) 98.1 F (36.7 C) (Oral)  Resp 16  Ht 5\' 11"  (1.803 m)  Wt 251 lb 1.9 oz (113.907 kg)  BMI 35.02 kg/m2       Objective:   Physical Exam  Constitutional: He appears well-developed and well-nourished.       Tired appearing  Cardiovascular: Normal rate and regular rhythm.   No murmur heard. Pulmonary/Chest: Effort normal. No respiratory distress.       Soft expiratory wheeze right base.  Improved air movement today throughout.  Psychiatric: He has a normal mood and affect. His behavior is normal. Judgment and thought content normal.          Assessment & Plan:

## 2011-07-26 ENCOUNTER — Ambulatory Visit
Admission: RE | Admit: 2011-07-26 | Discharge: 2011-07-26 | Disposition: A | Discharge status: Home / Self Care | Payer: No Typology Code available for payment source | Source: Ambulatory Visit | Attending: Anesthesiology | Admitting: Anesthesiology

## 2011-07-26 DIAGNOSIS — M542 Cervicalgia: Secondary | ICD-10-CM

## 2011-07-30 ENCOUNTER — Telehealth: Payer: Self-pay | Admitting: Family

## 2011-07-30 MED ORDER — DULOXETINE HCL 60 MG PO CPEP: 60.0000 mg | ORAL_CAPSULE | Freq: Every day | ORAL | Status: DC

## 2011-07-30 NOTE — Telephone Encounter (Signed)
Refill sent to pharmacy.   

## 2011-07-30 NOTE — Telephone Encounter (Signed)
cymbalta 60mg  capsule. Take one capsule by mouth every day. Qty 30. Last fill 8.20.12

## 2011-08-01 ENCOUNTER — Ambulatory Visit: Payer: No Typology Code available for payment source | Admitting: Family

## 2011-08-04 ENCOUNTER — Ambulatory Visit: Payer: No Typology Code available for payment source | Admitting: Family

## 2011-08-04 LAB — POCT CARDIAC MARKERS
CKMB, poc: <1 — ABNORMAL LOW
Myoglobin, poc: 23.2
Myoglobin, poc: 31.1
Operator id: 240821
Operator id: 267321
Operator id: 198161
Operator id: 216221
Troponin i, poc: <0.05
Troponin i, poc: <0.05

## 2011-08-04 LAB — LIPID PANEL
HDL: 26 — ABNORMAL LOW
LDL Cholesterol: 72
Total CHOL/HDL Ratio: 4.6
Triglycerides: 111
VLDL: 22

## 2011-08-04 LAB — URINALYSIS, ROUTINE W REFLEX MICROSCOPIC
Bilirubin Urine: NEGATIVE
Bilirubin Urine: NEGATIVE
Glucose, UA: NEGATIVE
Hgb urine dipstick: NEGATIVE
Ketones, ur: 15 — AB
Ketones, ur: NEGATIVE
Leukocytes, UA: NEGATIVE
Nitrite: NEGATIVE
Protein, ur: NEGATIVE
Protein, ur: NEGATIVE

## 2011-08-04 LAB — DIFFERENTIAL
Basophils Absolute: 0
Basophils Absolute: 0
Basophils Relative: 0
Basophils Relative: 1
Basophils Relative: 0
Basophils Relative: 1
Eosinophils Absolute: 0.3
Eosinophils Absolute: 0.3
Eosinophils Absolute: 0.4
Eosinophils Absolute: 0.5
Eosinophils Relative: 5
Eosinophils Relative: 6 — ABNORMAL HIGH
Eosinophils Relative: 5
Lymphocytes Relative: 32
Lymphs Abs: 2.2
Monocytes Absolute: 0.5
Monocytes Absolute: 0.6
Monocytes Absolute: 1
Monocytes Relative: 10
Monocytes Relative: 11
Neutro Abs: 2.5

## 2011-08-04 LAB — CBC
HCT: 41
Hemoglobin: 13
Hemoglobin: 13.9
MCHC: 35.6
MCHC: 34.8
MCV: 88.2
MCV: 89.8
MCV: 90
Platelets: 156
Platelets: 137 — ABNORMAL LOW
Platelets: 286
RBC: 4.07 — ABNORMAL LOW
RDW: 12.5
RDW: 12.5
RDW: 12.8
WBC: 7.1
WBC: 6.8
WBC: 9

## 2011-08-04 LAB — RAPID URINE DRUG SCREEN, HOSP PERFORMED
Amphetamines: NOT DETECTED
Amphetamines: NOT DETECTED
Barbiturates: NOT DETECTED
Benzodiazepines: POSITIVE — AB
Benzodiazepines: POSITIVE — AB
Cocaine: NOT DETECTED

## 2011-08-04 LAB — BASIC METABOLIC PANEL
BUN: 6
BUN: 6
CO2: 27
CO2: 27
Calcium: 8.7
Chloride: 101
Chloride: 103
Chloride: 96
Creatinine, Ser: 0.79
Creatinine, Ser: 0.85
Creatinine, Ser: 0.89
GFR calc Af Amer: >60
Glucose, Bld: 194 — ABNORMAL HIGH
Glucose, Bld: 377 — ABNORMAL HIGH
Potassium: 3.1 — ABNORMAL LOW
Sodium: 137

## 2011-08-04 LAB — ETHANOL: Alcohol, Ethyl (B): <5

## 2011-08-04 LAB — CARDIAC PANEL(CRET KIN+CKTOT+MB+TROPI): Troponin I: 0.01

## 2011-08-04 LAB — COMPREHENSIVE METABOLIC PANEL
ALT: 37
Albumin: 4.1
Alkaline Phosphatase: 91
Potassium: 3.1 — ABNORMAL LOW
Sodium: 136
Total Protein: 6.7

## 2011-08-06 ENCOUNTER — Ambulatory Visit: Payer: No Typology Code available for payment source | Admitting: Family

## 2011-08-29 ENCOUNTER — Ambulatory Visit (HOSPITAL_BASED_OUTPATIENT_CLINIC_OR_DEPARTMENT_OTHER)
Admission: RE | Admit: 2011-08-29 | Discharge: 2011-08-29 | Disposition: A | Discharge status: Home / Self Care | Payer: No Typology Code available for payment source | Source: Ambulatory Visit | Attending: Family | Admitting: Family

## 2011-08-29 ENCOUNTER — Ambulatory Visit (INDEPENDENT_AMBULATORY_CARE_PROVIDER_SITE_OTHER): Payer: No Typology Code available for payment source | Admitting: Family

## 2011-08-29 ENCOUNTER — Encounter: Payer: Self-pay | Admitting: Family

## 2011-08-29 ENCOUNTER — Ambulatory Visit: Payer: No Typology Code available for payment source | Admitting: Family

## 2011-08-29 VITALS — BP 120/82 | HR 125 | Temp 97.8°F | Resp 18 | Ht 71.0 in | Wt 253.1 lb

## 2011-08-29 DIAGNOSIS — Z23 Encounter for immunization: Secondary | ICD-10-CM

## 2011-08-29 DIAGNOSIS — E119 Type 2 diabetes mellitus without complications: Secondary | ICD-10-CM

## 2011-08-29 DIAGNOSIS — J189 Pneumonia, unspecified organism: Secondary | ICD-10-CM

## 2011-08-29 DIAGNOSIS — E118 Type 2 diabetes mellitus with unspecified complications: Secondary | ICD-10-CM

## 2011-08-29 DIAGNOSIS — E785 Hyperlipidemia, unspecified: Secondary | ICD-10-CM

## 2011-08-29 NOTE — Assessment & Plan Note (Signed)
His diabetes seems to be better controlled.  Will request lab work from Endocrinology.

## 2011-08-29 NOTE — Progress Notes (Signed)
Subjective:    Patient ID: [name], male    DOB: [birth year], 35 y.o.   MRN: [id number]  HPI  [name] is a 35 yr old male who presents today for follow up.  Pneumonia- He reports that his energy is improved.  He continues to cough.  Was productive of green sputum but now white.  Occasional wheezing which is improved by albuterol.    DM2-  He reports that his sugars are stable.  140 post prandially.  He reports that his fasting sugars are about 120-125.  He is using lantus 34 units sq at bedtime.  He is using humalog 25 units three times a day AC meals.  Last eye exam was a few months ago. He following with [name] for endocrinology.   Peripheral Neuropathy- pt tells me that he has been diagnosed with peripheral neuropathy.  Notes intermittent discomfort, but not that bothersome.    Review of Systems  Respiratory: Negative for shortness of breath.   Cardiovascular: Negative for chest pain.  Genitourinary: Positive for frequency.   Past Medical History  Diagnosis Date  . Allergy   . Asthma   . Depression   . GERD (gastroesophageal reflux disease)   . Hypertension   . Diabetes mellitus     Type II  . Hyperlipidemia   . Chronic pain disorder     Sees [name]  . Urinary incontinence     detrusor instability  . Hypogonadism     History   Social History  . Marital Status: Married    Spouse Name: N/A    Number of Children: 2  . Years of Education: N/A   Occupational History  . Disabled     chronic pain   Social History Main Topics  . Smoking status: Current Everyday Smoker -- 0.3 packs/day    Types: Cigarettes  . Smokeless tobacco: Never Used  . Alcohol Use: No  . Drug Use: No     stopped cocaine--2003. Normal drug screen March 2008  . Sexually Active: Not on file   Other Topics Concern  . Not on file   Social History Narrative  . No narrative on file    Past Surgical History  Procedure Date  . Appendectomy   . Hernia repair    . Nasal sinus surgery 2008    Family History  Problem Relation Age of Onset  . Diabetes Mother   . Hypertension Mother   . Cirrhosis Mother   . Arthritis Father 49    osteoarthritis  . Diabetes Sister     borderline  . Heart disease Paternal Uncle 73    MI  . Heart disease Maternal Grandfather     late 60's--MI  . Cancer Paternal Grandmother 74    lung  . Heart disease Paternal Grandfather 60    MI    Allergies  Allergen Reactions  . Ciprofloxacin     REACTION: Rash and itching  . Hydrocodone-Acetaminophen     REACTION: Rash and itching    Current Outpatient Prescriptions on File Prior to Visit  Medication Sig Dispense Refill  . albuterol (PROVENTIL) 90 MCG/ACT inhaler Inhale 2 puffs into the lungs every 6 (six) hours as needed for wheezing.  17 g  12  . aspirin 81 MG tablet Take 81 mg by mouth daily.        Marland Kitchen azelastine (ASTELIN) 137 MCG/SPRAY nasal spray 2 sprays by Nasal route every morning. Use in each nostril as directed       .  baclofen (LIORESAL) 20 MG tablet Take 20 mg by mouth at bedtime.        . cetirizine (ZYRTEC) 10 MG tablet Take 10 mg by mouth daily.        . ciclesonide (OMNARIS) 50 MCG/ACT nasal spray 2 sprays by Each Nare route at bedtime as needed.        . clonazePAM (KLONOPIN) 2 MG tablet Take 2 mg by mouth 2 (two) times daily as needed.        . DULoxetine (CYMBALTA) 60 MG capsule Take 1 capsule (60 mg total) by mouth daily.  30 capsule  1  . insulin glargine (LANTUS) 100 UNIT/ML injection Inject 34 Units into the skin at bedtime.       . insulin lispro (HUMALOG) 100 UNIT/ML injection Inject 25 Units into the skin 3 (three) times daily before meals.       [employer] (AUTO-LANCET) MISC Check blood sugar 4 times daily.       Marland Kitchen LOVAZA 1 G capsule TAKE 4 TABLETS BY MOUTH DAILY.  120 capsule  2  . metFORMIN (GLUCOPHAGE) 1000 MG tablet Take 1,000 mg by mouth 2 (two) times daily with meals.        . Multiple Vitamins-Minerals (MULTIVITAMIN WITH  MINERALS) tablet Take 1 tablet by mouth daily.        . nabumetone (RELAFEN) 750 MG tablet Take 750 mg by mouth 2 (two) times daily.        . NON FORMULARY Inject as directed. 3 x a week.   ALLERGY INJECTIONS.       Marland Kitchen pregabalin (LYRICA) 100 MG capsule Take 100 mg by mouth 3 (three) times daily.        Marland Kitchen SIMCOR 1000-20 MG 24 hr tablet TAKE 1 TABLET BY MOUTH AT BEDTIME.  30 tablet  2  . tadalafil (CIALIS) 5 MG tablet Take 5 mg by mouth daily as needed.        . Tapentadol HCl (NUCYNTA) 100 MG TABS 1 tablet six times a day.      . traZODone (DESYREL) 50 MG tablet Take 1/2 tablet by mouth at bedtime.         BP 120/82  Pulse 125  Temp(Src) 97.8 F (36.6 C) (Oral)  Resp 18  Ht 5\' 11"  (1.803 m)  Wt 253 lb 1.3 oz (114.796 kg)  BMI 35.30 kg/m2  SpO2 97%        Objective:   Physical Exam  Constitutional: He appears well-developed and well-nourished.  Cardiovascular: Normal rate and regular rhythm.   No murmur heard. Pulmonary/Chest: Effort normal and breath sounds normal. No respiratory distress. He has no wheezes. He has no rales. He exhibits no tenderness.  Skin: Skin is warm and dry.  Psychiatric: He has a normal mood and affect. His behavior is normal. Judgment and thought content normal.          Assessment & Plan:

## 2011-08-29 NOTE — Assessment & Plan Note (Signed)
CXR shows complete resolution of pneumonia.

## 2011-09-01 ENCOUNTER — Encounter: Payer: Self-pay | Admitting: *Deleted

## 2011-09-26 ENCOUNTER — Telehealth: Payer: Self-pay | Admitting: *Deleted

## 2011-09-26 NOTE — Telephone Encounter (Signed)
Records received from Dr Lucianne Muss (endocrinologist) and forwarded to provider for review.

## 2011-09-27 ENCOUNTER — Other Ambulatory Visit: Payer: Self-pay | Admitting: Family

## 2011-10-16 ENCOUNTER — Other Ambulatory Visit: Payer: Self-pay | Admitting: Family

## 2011-10-21 ENCOUNTER — Other Ambulatory Visit: Payer: Self-pay | Admitting: Family

## 2011-11-17 ENCOUNTER — Other Ambulatory Visit: Payer: Self-pay | Admitting: *Deleted

## 2011-11-17 MED ORDER — DULOXETINE HCL 60 MG PO CPEP: 60.0000 mg | ORAL_CAPSULE | Freq: Every day | ORAL | Status: DC

## 2011-11-17 NOTE — Telephone Encounter (Signed)
OK to refill

## 2011-11-17 NOTE — Telephone Encounter (Signed)
Refill sent to pharmacy, unable to reach pt as line is busy.

## 2011-11-17 NOTE — Telephone Encounter (Signed)
Received call from pt's wife stating pt has 1 Cymbalta left and he should have enough to last him until 11/23/11. She is not sure if he has mistakenly taken more than 1 a day but he is requesting an early refill. Please advise if ok to refill.

## 2011-11-18 NOTE — Telephone Encounter (Signed)
Notified pt's wife, [name].

## 2011-12-11 ENCOUNTER — Other Ambulatory Visit: Payer: Self-pay | Admitting: Family

## 2012-01-08 ENCOUNTER — Other Ambulatory Visit: Payer: Self-pay | Admitting: Family

## 2012-01-30 ENCOUNTER — Ambulatory Visit: Payer: No Typology Code available for payment source | Admitting: Family

## 2012-02-04 ENCOUNTER — Telehealth: Payer: Self-pay | Admitting: Family

## 2012-02-04 ENCOUNTER — Encounter: Payer: Self-pay | Admitting: Family

## 2012-02-04 ENCOUNTER — Ambulatory Visit (INDEPENDENT_AMBULATORY_CARE_PROVIDER_SITE_OTHER): Payer: No Typology Code available for payment source | Admitting: Family

## 2012-02-04 VITALS — BP 142/90 | HR 117 | Temp 98.0°F | Resp 16 | Ht 71.0 in | Wt 247.0 lb

## 2012-02-04 DIAGNOSIS — J029 Acute pharyngitis, unspecified: Secondary | ICD-10-CM

## 2012-02-04 DIAGNOSIS — E785 Hyperlipidemia, unspecified: Secondary | ICD-10-CM

## 2012-02-04 DIAGNOSIS — F329 Major depressive disorder, single episode, unspecified: Secondary | ICD-10-CM

## 2012-02-04 DIAGNOSIS — M79602 Pain in left arm: Secondary | ICD-10-CM

## 2012-02-04 DIAGNOSIS — M79609 Pain in unspecified limb: Secondary | ICD-10-CM

## 2012-02-04 DIAGNOSIS — E118 Type 2 diabetes mellitus with unspecified complications: Secondary | ICD-10-CM

## 2012-02-04 DIAGNOSIS — M542 Cervicalgia: Secondary | ICD-10-CM

## 2012-02-04 DIAGNOSIS — F3289 Other specified depressive episodes: Secondary | ICD-10-CM

## 2012-02-04 LAB — BASIC METABOLIC PANEL WITH GFR
CO2: 26 mEq/L (ref 19–32)
Calcium: 9.8 mg/dL (ref 8.4–10.5)
Chloride: 103 mEq/L (ref 96–112)
Creat: 0.96 mg/dL (ref 0.50–1.35)
Glucose, Bld: 245 mg/dL — ABNORMAL HIGH (ref 70–99)
Sodium: 140 mEq/L (ref 135–145)

## 2012-02-04 LAB — HEPATIC FUNCTION PANEL
AST: 29 U/L (ref 0–37)
Alkaline Phosphatase: 78 U/L (ref 39–117)
Bilirubin, Direct: 0.1 mg/dL (ref 0.0–0.3)
Total Bilirubin: 0.5 mg/dL (ref 0.3–1.2)

## 2012-02-04 LAB — LIPID PANEL
LDL Cholesterol: 74 mg/dL (ref 0–99)
Total CHOL/HDL Ratio: 3.3 Ratio
Triglycerides: 139 mg/dL (ref <150)
VLDL: 28 mg/dL (ref 0–40)

## 2012-02-04 NOTE — Assessment & Plan Note (Signed)
Stable.  Continue Cymbalta. 

## 2012-02-04 NOTE — Patient Instructions (Addendum)
Please complete your blood work prior to leaving.  Call if your sore throat symptoms worsen, or if you are not feeling better in 2-3 days. Please follow up in 1 month. Call with blood sugar readings in 1 week.

## 2012-02-04 NOTE — Telephone Encounter (Signed)
[facility] does not participate in the patients insurance.  May I send his referral to Neurosurgery at [facility] in [facility]

## 2012-02-04 NOTE — Telephone Encounter (Signed)
Yes, thanks

## 2012-02-04 NOTE — Assessment & Plan Note (Signed)
MRI reviewed from 8/12- notes spinal stenosis and progression of C4-C5 right eccentric disc protrusion. Will refer to Neurosurgery for further evaluation.

## 2012-02-04 NOTE — Assessment & Plan Note (Signed)
Obtain urine microalbumin, A1C, fasting labs.  Increase lantus to 40 units HS- pt to call in 1 week with readings .

## 2012-02-04 NOTE — Assessment & Plan Note (Signed)
Symptoms most consistent with viral etiology at this time. He is instructed to contact us if his symptoms worsen, or if no improvement in 2-3 days.

## 2012-02-04 NOTE — Progress Notes (Signed)
Subjective:    Patient ID: [name], male    DOB: [birth year], 36 y.o.   MRN: [id number]  HPI  [name] is a 36 yr old male who presents today with several concerns.  1) Left neck pain- pain radiates down the left arm and is associated with arm weakness and swelling.  He has been following with pain management.  Reports + muscle spasms up the left side of his head. Has been following with  Pand[facility] Leiter- [facility].Had MRI of the C spine performed over the summer.   2) Sinus congestion notes some sinus congestion started Sunday 3/24, - throat is "raw."    3) DM2- reports that Dr. Lucianne Muss will no longer see him due to his insurance.  Can't get sugar below 200.  He reports that his sugar this AM was 325, but yesterday afternoon sugar dropped to 45.  This is his only hypoglycemic event. His diabetes regimen is as follows:  Metformin 1000 mg BID Lantus- 34 units at bedtime Humalog 30 units AC meals.  Reports that he is often hungry, but he has lost some weight.  Wonders if his "thyroid is off" as he tells me this has been an issue for him in the past.   4) Depression- reports that he is feeling good on cymbalta.   Review of Systems See HPI  Past Medical History  Diagnosis Date  . Allergy   . Asthma   . Depression   . GERD (gastroesophageal reflux disease)   . Hypertension   . Diabetes mellitus     Type II  . Hyperlipidemia   . Chronic pain disorder     Sees [facility]  . Urinary incontinence     detrusor instability  . Hypogonadism     History   Social History  . Marital Status: Married    Spouse Name: N/A    Number of Children: 2  . Years of Education: N/A   Occupational History  . Disabled     chronic pain   Social History Main Topics  . Smoking status: Current Everyday Smoker -- 0.3 packs/day    Types: Cigarettes  . Smokeless tobacco: Never Used  . Alcohol Use: No  . Drug Use: No     stopped cocaine--2003. Normal drug  screen March 2008  . Sexually Active: Not on file   Other Topics Concern  . Not on file   Social History Narrative  . No narrative on file    Past Surgical History  Procedure Date  . Appendectomy   . Hernia repair   . Nasal sinus surgery 2008    Family History  Problem Relation Age of Onset  . Diabetes Mother   . Hypertension Mother   . Cirrhosis Mother   . Arthritis Father 49    osteoarthritis  . Diabetes Sister     borderline  . Heart disease Paternal Uncle 69    MI  . Heart disease Maternal Grandfather     late 60's--MI  . Cancer Paternal Grandmother 10    lung  . Heart disease Paternal Grandfather 31    MI    Allergies  Allergen Reactions  . Ciprofloxacin     REACTION: Rash and itching  . Hydrocodone-Acetaminophen     REACTION: Rash and itching    Current Outpatient Prescriptions on File Prior to Visit  Medication Sig Dispense Refill  . aspirin 81 MG tablet Take 81 mg by mouth daily.        Marland Kitchen  azelastine (ASTELIN) 137 MCG/SPRAY nasal spray 2 sprays by Nasal route every morning. Use in each nostril as directed       . baclofen (LIORESAL) 20 MG tablet Take 20 mg by mouth at bedtime.        . cetirizine (ZYRTEC) 10 MG tablet Take 10 mg by mouth daily.        . ciclesonide (OMNARIS) 50 MCG/ACT nasal spray 2 sprays by Each Nare route at bedtime as needed.        . clonazePAM (KLONOPIN) 2 MG tablet Take 2 mg by mouth 2 (two) times daily as needed.        . CYMBALTA 60 MG capsule TAKE ONE CAPSULE BY MOUTH EVERY DAY  30 capsule  2  . insulin glargine (LANTUS) 100 UNIT/ML injection Inject 45 Units into the skin at bedtime.       . insulin lispro (HUMALOG) 100 UNIT/ML injection Inject 30 Units into the skin 3 (three) times daily before meals.       [employer] (AUTO-LANCET) MISC Check blood sugar 4 times daily.       Marland Kitchen LOVAZA 1 G capsule TAKE 4 TABLETS BY MOUTH DAILY.  120 capsule  2  . metFORMIN (GLUCOPHAGE) 1000 MG tablet Take 1,000 mg by mouth 2 (two) times  daily with meals.        . Multiple Vitamins-Minerals (MULTIVITAMIN WITH MINERALS) tablet Take 1 tablet by mouth daily.        . nabumetone (RELAFEN) 750 MG tablet Take 750 mg by mouth 2 (two) times daily.        . NON FORMULARY Inject as directed. 3 x a week.   ALLERGY INJECTIONS.       Marland Kitchen pregabalin (LYRICA) 100 MG capsule Take 100 mg by mouth 3 (three) times daily.        Marland Kitchen SIMCOR 1000-20 MG 24 hr tablet TAKE 1 TABLET BY MOUTH AT BEDTIME.  30 tablet  2  . tadalafil (CIALIS) 5 MG tablet Take 5 mg by mouth daily as needed.        . Tapentadol HCl (NUCYNTA) 100 MG TABS 1 tablet six times a day.      . traZODone (DESYREL) 50 MG tablet Take 1/2 tablet by mouth at bedtime.       Marland Kitchen albuterol (PROVENTIL) 90 MCG/ACT inhaler Inhale 2 puffs into the lungs every 6 (six) hours as needed for wheezing.  17 g  12    BP 142/90  Pulse 117  Temp(Src) 98 F (36.7 C) (Oral)  Resp 16  Ht 5\' 11"  (1.803 m)  Wt 247 lb (112.038 kg)  BMI 34.45 kg/m2  SpO2 97%       Objective:   Physical Exam  Constitutional: He appears well-developed and well-nourished. No distress.  HENT:  Head: Normocephalic and atraumatic.  Right Ear: Tympanic membrane and ear canal normal.  Left Ear: Tympanic membrane and ear canal normal.  Mouth/Throat: Posterior oropharyngeal erythema present. No oropharyngeal exudate or posterior oropharyngeal edema.  Cardiovascular: Normal rate and regular rhythm.   No murmur heard. Pulmonary/Chest: Effort normal and breath sounds normal.  Lymphadenopathy:    He has cervical adenopathy.  Neurological:  Reflex Scores:      Tricep reflexes are 2+ on the right side and 2+ on the left side.      Bicep reflexes are 2+ on the right side and 2+ on the left side.      Patellar reflexes are 2+ on the right  side and 0 on the left side.      Diminished hand grasp left hand.  LUE 4-5/5 strength. Bilateral LE strength is slightly diminished.  Psychiatric: He has a normal mood and affect. His behavior  is normal. Judgment and thought content normal.          Assessment & Plan:

## 2012-02-05 ENCOUNTER — Encounter: Payer: Self-pay | Admitting: Family

## 2012-02-05 ENCOUNTER — Other Ambulatory Visit: Payer: Self-pay | Admitting: Family

## 2012-02-05 LAB — MICROALBUMIN / CREATININE URINE RATIO
Creatinine, Urine: 188 mg/dL
Microalb Creat Ratio: 7.4 mg/g (ref 0.0–30.0)

## 2012-02-09 ENCOUNTER — Telehealth: Payer: Self-pay | Admitting: *Deleted

## 2012-02-09 ENCOUNTER — Telehealth: Payer: Self-pay | Admitting: Family

## 2012-02-09 MED ORDER — OMEGA-3-ACID ETHYL ESTERS 1 G PO CAPS: ORAL_CAPSULE | ORAL | Status: DC

## 2012-02-09 MED ORDER — AZITHROMYCIN 250 MG PO TABS: ORAL_TABLET | ORAL | Status: AC

## 2012-02-09 NOTE — Telephone Encounter (Signed)
Rx sent for Zpak.  He should start today and should be seen back in the office if he developsf fever >101, shortness of breath, worsening cough or if not feeling better in 2 days.

## 2012-02-09 NOTE — Telephone Encounter (Signed)
Pt.notified

## 2012-02-09 NOTE — Telephone Encounter (Signed)
Refill sent to pharmacy.   

## 2012-02-09 NOTE — Telephone Encounter (Signed)
Received voice message from pt's wife stating pt is not feeling any better.  Now has cough with green phlegm. She reports that we told him to call us if his symptoms did not improve. Please advise.

## 2012-02-09 NOTE — Telephone Encounter (Signed)
Refill- lovaza 1gm capsule. Take 4 tablets by mouth daily. Qty 120 last fill 2.14.13

## 2012-03-08 ENCOUNTER — Ambulatory Visit (INDEPENDENT_AMBULATORY_CARE_PROVIDER_SITE_OTHER): Payer: No Typology Code available for payment source | Admitting: Family

## 2012-03-08 ENCOUNTER — Ambulatory Visit: Payer: No Typology Code available for payment source | Admitting: Family

## 2012-03-08 ENCOUNTER — Other Ambulatory Visit: Payer: Self-pay | Admitting: *Deleted

## 2012-03-08 ENCOUNTER — Telehealth: Payer: Self-pay | Admitting: Family

## 2012-03-08 ENCOUNTER — Encounter: Payer: Self-pay | Admitting: Family

## 2012-03-08 VITALS — BP 112/78 | HR 86 | Temp 97.8°F | Resp 16 | Ht 71.0 in | Wt 258.0 lb

## 2012-03-08 DIAGNOSIS — E118 Type 2 diabetes mellitus with unspecified complications: Secondary | ICD-10-CM

## 2012-03-08 DIAGNOSIS — M542 Cervicalgia: Secondary | ICD-10-CM

## 2012-03-08 NOTE — Telephone Encounter (Signed)
Received message from [employer] wanting to know the status of their suppy request. Cannot find request in the office. Attempted to reach company and left voice mail to re-fax the request.

## 2012-03-08 NOTE — Assessment & Plan Note (Signed)
Improving, but not yet at goal.  Will increase lantus from 40-44 and increase novolog from 30 to 32 units TID AC meals.

## 2012-03-08 NOTE — Progress Notes (Signed)
Subjective:    Patient ID: [name], male    DOB: [birth year], 36 y.o.   MRN: [id number]  HPI  Mr.  Schreur is a 36 yr old male who presents today for follow up of his diabetes.  DM2- fasting sugars 125-150.  Post prandial post parandial 200-250.  He continues lantus at 40 units at bedtime.  Novolog is 30 units TID AC meals.  He report that he has had 1-2 hypoglycemic events which occurred after he dosed novolog, but then waited too long for a meal.    Neck pain-  He had nerve conduction studies completed at pain management. Saw Neurosurgeon in [place]. Neck pain comes and goes.    Review of Systems See HPI Past Medical History  Diagnosis Date  . Allergy   . Asthma   . Depression   . GERD (gastroesophageal reflux disease)   . Hypertension   . Diabetes mellitus     Type II  . Hyperlipidemia   . Chronic pain disorder     Sees [facility]  . Urinary incontinence     detrusor instability  . Hypogonadism     History   Social History  . Marital Status: Married    Spouse Name: N/A    Number of Children: 2  . Years of Education: N/A   Occupational History  . Disabled     chronic pain   Social History Main Topics  . Smoking status: Current Everyday Smoker -- 0.3 packs/day    Types: Cigarettes  . Smokeless tobacco: Never Used  . Alcohol Use: No  . Drug Use: No     stopped cocaine--2003. Normal drug screen March 2008  . Sexually Active: Not on file   Other Topics Concern  . Not on file   Social History Narrative  . No narrative on file    Past Surgical History  Procedure Date  . Appendectomy   . Hernia repair   . Nasal sinus surgery 2008    Family History  Problem Relation Age of Onset  . Diabetes Mother   . Hypertension Mother   . Cirrhosis Mother   . Arthritis Father 49    osteoarthritis  . Diabetes Sister     borderline  . Heart disease Paternal Uncle 16    MI  . Heart disease Maternal Grandfather     late 60's--MI  . Cancer  Paternal Grandmother 67    lung  . Heart disease Paternal Grandfather 62    MI    Allergies  Allergen Reactions  . Ciprofloxacin     REACTION: Rash and itching  . Hydrocodone-Acetaminophen     REACTION: Rash and itching    Current Outpatient Prescriptions on File Prior to Visit  Medication Sig Dispense Refill  . aspirin 81 MG tablet Take 81 mg by mouth daily.        Marland Kitchen azelastine (ASTELIN) 137 MCG/SPRAY nasal spray 2 sprays by Nasal route every morning. Use in each nostril as directed       . baclofen (LIORESAL) 20 MG tablet Take 20 mg by mouth at bedtime.        . cetirizine (ZYRTEC) 10 MG tablet Take 10 mg by mouth daily.        . ciclesonide (OMNARIS) 50 MCG/ACT nasal spray 2 sprays by Each Nare route at bedtime as needed.        . clonazePAM (KLONOPIN) 2 MG tablet Take 2 mg by mouth 2 (two) times daily as needed.        Marland Kitchen  CYMBALTA 60 MG capsule TAKE ONE CAPSULE BY MOUTH EVERY DAY  30 capsule  2  . insulin aspart (NOVOLOG FLEXPEN) 100 UNIT/ML injection Inject 30 Units into the skin 3 (three) times daily before meals.  30 mL  2  . insulin lispro (HUMALOG) 100 UNIT/ML injection Inject 32 Units into the skin 3 (three) times daily before meals.       [employer] (AUTO-LANCET) MISC Check blood sugar 4 times daily.       . metFORMIN (GLUCOPHAGE) 1000 MG tablet Take 1,000 mg by mouth 2 (two) times daily with meals.        . Multiple Vitamins-Minerals (MULTIVITAMIN WITH MINERALS) tablet Take 1 tablet by mouth daily.        . nabumetone (RELAFEN) 750 MG tablet Take 750 mg by mouth 2 (two) times daily.        . NON FORMULARY Inject as directed. 3 x a week.   ALLERGY INJECTIONS.       Marland Kitchen omega-3 acid ethyl esters (LOVAZA) 1 G capsule Take 4 tablets by mouth daily.  120 capsule  2  . pregabalin (LYRICA) 100 MG capsule Take 100 mg by mouth 3 (three) times daily.        Marland Kitchen SIMCOR 1000-20 MG 24 hr tablet TAKE 1 TABLET BY MOUTH AT BEDTIME.  30 tablet  2  . tadalafil (CIALIS) 5 MG tablet Take 5  mg by mouth daily as needed.        . Tapentadol HCl (NUCYNTA) 100 MG TABS 1 tablet six times a day.      . traZODone (DESYREL) 50 MG tablet Take 1/2 tablet by mouth at bedtime.       Marland Kitchen DISCONTD: albuterol (PROVENTIL) 90 MCG/ACT inhaler Inhale 2 puffs into the lungs every 6 (six) hours as needed for wheezing.  17 g  12  . DISCONTD: insulin glargine (LANTUS SOLOSTAR) 100 UNIT/ML injection Inject 40 Units into the skin at bedtime.  4 pen  2    BP 112/78  Pulse 86  Temp(Src) 97.8 F (36.6 C) (Oral)  Resp 16  Ht 5\' 11"  (1.803 m)  Wt 258 lb (117.028 kg)  BMI 35.98 kg/m2  SpO2 98%       Objective:   Physical Exam  Constitutional: He appears well-developed and well-nourished. No distress.  Cardiovascular: Normal rate and regular rhythm.   No murmur heard. Pulmonary/Chest: Effort normal and breath sounds normal. No respiratory distress. He has no wheezes. He has no rales. He exhibits no tenderness.  Musculoskeletal: He exhibits no edema.  Psychiatric: He has a normal mood and affect. His behavior is normal. Judgment and thought content normal.          Assessment & Plan:

## 2012-03-08 NOTE — Telephone Encounter (Signed)
Received records from Dr Reather Littler at [facility] and forwarded to Provider for review.

## 2012-03-08 NOTE — Assessment & Plan Note (Signed)
Improved. Defer management to Neurosurgery.  Will request consultation report.

## 2012-03-08 NOTE — Patient Instructions (Signed)
Please follow up in the end of June.  Sooner if problems/concerns.

## 2012-03-09 NOTE — Telephone Encounter (Signed)
Received call from [employer] requesting status of diabetes testing supply order. Advised them we have not received form and to please re-fax it.

## 2012-03-10 NOTE — Telephone Encounter (Signed)
Form received and forwarded to Provider for signature. 

## 2012-03-10 NOTE — Telephone Encounter (Signed)
Signed.

## 2012-03-11 NOTE — Telephone Encounter (Signed)
Form faxed to [employer] at [phone].

## 2012-03-17 ENCOUNTER — Telehealth: Payer: Self-pay | Admitting: *Deleted

## 2012-03-17 NOTE — Telephone Encounter (Signed)
Pt was referred to surgeon re: his neck problems. Completed nerve conduction study. Gave pt option to do a "dye test" or wait a little longer and try steroid epidural injections? Pt wanted to know what you would recommend? Will epidural injections elevate pt's blood sugar levels. Please advise.

## 2012-03-17 NOTE — Telephone Encounter (Signed)
Notified pt. 

## 2012-03-17 NOTE — Telephone Encounter (Signed)
This is outside my area of expertise.  I would recommend that he discuss further with neurosurgery if he has further questions.  Steroid injection may raise blood sugar.

## 2012-03-20 ENCOUNTER — Emergency Department (HOSPITAL_BASED_OUTPATIENT_CLINIC_OR_DEPARTMENT_OTHER)
Admission: EM | Admit: 2012-03-20 | Discharge: 2012-03-21 | Disposition: A | Discharge status: Home / Self Care | Payer: No Typology Code available for payment source | Attending: Emergency Medicine | Admitting: Emergency Medicine

## 2012-03-20 ENCOUNTER — Emergency Department (INDEPENDENT_AMBULATORY_CARE_PROVIDER_SITE_OTHER): Payer: No Typology Code available for payment source

## 2012-03-20 ENCOUNTER — Encounter (HOSPITAL_BASED_OUTPATIENT_CLINIC_OR_DEPARTMENT_OTHER): Payer: Self-pay | Admitting: *Deleted

## 2012-03-20 DIAGNOSIS — IMO0001 Reserved for inherently not codable concepts without codable children: Secondary | ICD-10-CM | POA: Insufficient documentation

## 2012-03-20 DIAGNOSIS — E119 Type 2 diabetes mellitus without complications: Secondary | ICD-10-CM | POA: Insufficient documentation

## 2012-03-20 DIAGNOSIS — M797 Fibromyalgia: Secondary | ICD-10-CM

## 2012-03-20 DIAGNOSIS — R51 Headache: Secondary | ICD-10-CM

## 2012-03-20 DIAGNOSIS — Z794 Long term (current) use of insulin: Secondary | ICD-10-CM | POA: Insufficient documentation

## 2012-03-20 DIAGNOSIS — I10 Essential (primary) hypertension: Secondary | ICD-10-CM | POA: Insufficient documentation

## 2012-03-20 DIAGNOSIS — M79609 Pain in unspecified limb: Secondary | ICD-10-CM | POA: Insufficient documentation

## 2012-03-20 DIAGNOSIS — R296 Repeated falls: Secondary | ICD-10-CM | POA: Insufficient documentation

## 2012-03-20 DIAGNOSIS — G8929 Other chronic pain: Secondary | ICD-10-CM

## 2012-03-20 DIAGNOSIS — Z79899 Other long term (current) drug therapy: Secondary | ICD-10-CM | POA: Insufficient documentation

## 2012-03-20 DIAGNOSIS — T1490XA Injury, unspecified, initial encounter: Secondary | ICD-10-CM | POA: Insufficient documentation

## 2012-03-20 DIAGNOSIS — W19XXXA Unspecified fall, initial encounter: Secondary | ICD-10-CM

## 2012-03-20 DIAGNOSIS — M542 Cervicalgia: Secondary | ICD-10-CM

## 2012-03-20 LAB — DIFFERENTIAL
Basophils Absolute: 0 10*3/uL (ref 0.0–0.1)
Eosinophils Absolute: 0.2 10*3/uL (ref 0.0–0.7)
Eosinophils Relative: 3 % (ref 0–5)
Lymphs Abs: 1.9 10*3/uL (ref 0.7–4.0)
Monocytes Absolute: 0.9 10*3/uL (ref 0.1–1.0)

## 2012-03-20 LAB — CBC
HCT: 41.2 % (ref 39.0–52.0)
MCH: 31.6 pg (ref 26.0–34.0)
MCV: 91.8 fL (ref 78.0–100.0)
Platelets: 200 10*3/uL (ref 150–400)
RDW: 13.6 % (ref 11.5–15.5)

## 2012-03-20 MED ORDER — HYDROMORPHONE HCL PF 1 MG/ML IJ SOLN
1.0000 mg | Freq: Once | INTRAMUSCULAR | Status: AC
  Administered 2012-03-20: 1 mg via INTRAVENOUS
  Filled 2012-03-20: qty 1

## 2012-03-20 MED ORDER — ONDANSETRON HCL 4 MG/2ML IJ SOLN
4.0000 mg | Freq: Once | INTRAMUSCULAR | Status: AC
  Administered 2012-03-20: 4 mg via INTRAVENOUS
  Filled 2012-03-20: qty 2

## 2012-03-20 NOTE — ED Notes (Addendum)
Patient states that his neck is hurting at the base of his head. Then he states that "he's hurting all over, he's hurting bad". States this pain started a few days ago,  patient has been followed for neck pain and referred to a Careers adviser. Scheduled for an epidural this month.

## 2012-03-20 NOTE — ED Provider Notes (Addendum)
History   This chart was scribed for Gwyneth Sprout, MD by Shari Heritage. The patient was seen in room MH01/MH01. Patient's care was started at 2229.    CSN: [id number]  Arrival date & time 03/20/12  2229   First MD Initiated Contact with Patient 03/20/12 2315      Chief Complaint  Patient presents with  . Neck Pain    (Consider location/radiation/quality/duration/timing/severity/associated sxs/prior treatment) Patient is a 36 y.o. male presenting with neck pain. The history is provided by the patient. No language interpreter was used.  Neck Pain    [name] is a 36 y.o. male who presents to the Emergency Department complaining of persistent, severe neck pain that radiates up to his head and down his left arm onset 2 days ago after his sugar dropped and he sustained a fall. Patient has associated symptoms of nausea, urinary frequency, chills, HA, disorientation, body aches and weakness and numbness in his hands. Patient describes pain in his neck and head as burning and that it is worse on the left side. Patient reports that he took his regular dosage of insulin. Patient says that he has a history of neck problems. He is scheduled for an epidural this month. Patient has never had neck surgery. Patient reports that his prostate has been checked and it isn't enlarged, but he does have bladder spasms. Patient takes Nucynta (100 mg tabs) to manage chronic pain. Patient is allergic to Ciprofloxacin. Patient denies diarrhea, vomiting and fever. Patient with h/o of appendectomy, hernia repair, nasal sinus surgery, asthma, HTN, diabetes, hyperlipidemia, fibromyalgia, and neuropathy, and urinary incontinence.  Past Medical History  Diagnosis Date  . Allergy   . Asthma   . Depression   . GERD (gastroesophageal reflux disease)   . Hypertension   . Diabetes mellitus     Type II  . Hyperlipidemia   . Chronic pain disorder     Sees [facility]  . Urinary incontinence    detrusor instability  . Hypogonadism     Past Surgical History  Procedure Date  . Appendectomy   . Hernia repair   . Nasal sinus surgery 2008    Family History  Problem Relation Age of Onset  . Diabetes Mother   . Hypertension Mother   . Cirrhosis Mother   . Arthritis Father 49    osteoarthritis  . Diabetes Sister     borderline  . Heart disease Paternal Uncle 35    MI  . Heart disease Maternal Grandfather     late 60's--MI  . Cancer Paternal Grandmother 22    lung  . Heart disease Paternal Grandfather 44    MI    History  Substance Use Topics  . Smoking status: Current Everyday Smoker -- 0.3 packs/day    Types: Cigarettes  . Smokeless tobacco: Never Used  . Alcohol Use: No      Review of Systems  A complete 10 system review of systems was obtained and all systems are negative except as noted in the HPI and PMH.   Allergies  Ciprofloxacin and Hydrocodone-acetaminophen  Home Medications   Current Outpatient Rx  Name Route Sig Dispense Refill  . ASPIRIN 81 MG PO TABS Oral Take 81 mg by mouth daily.      . AZELASTINE HCL 137 MCG/SPRAY NA SOLN Nasal 2 sprays by Nasal route every morning. Use in each nostril as directed     . BACLOFEN 20 MG PO TABS Oral Take 20 mg by mouth  at bedtime.      Marland Kitchen CETIRIZINE HCL 10 MG PO TABS Oral Take 10 mg by mouth daily.      Marland Kitchen CICLESONIDE 50 MCG/ACT NA SUSP Each Nare 2 sprays by Each Nare route at bedtime as needed.      Marland Kitchen CLONAZEPAM 2 MG PO TABS Oral Take 2 mg by mouth 2 (two) times daily as needed.      . CYMBALTA 60 MG PO CPEP  TAKE ONE CAPSULE BY MOUTH EVERY DAY 30 capsule 2  . IBUPROFEN 200 MG PO TABS Oral Take 200 mg by mouth every 6 (six) hours as needed. Patient used this medication for his pain.    . INSULIN ASPART 100 UNIT/ML [place] SOLN Subcutaneous Inject 30 Units into the skin 3 (three) times daily before meals. 30 mL 2  . INSULIN GLARGINE 100 UNIT/ML [place] SOLN Subcutaneous Inject 44 Units into the skin at bedtime.    Marland Kitchen  METFORMIN HCL 1000 MG PO TABS Oral Take 1,000 mg by mouth 2 (two) times daily with meals.      . MULTI-VITAMIN/MINERALS PO TABS Oral Take 1 tablet by mouth daily.      Marland Kitchen NABUMETONE 750 MG PO TABS Oral Take 750 mg by mouth 2 (two) times daily.      . OMEGA-3-ACID ETHYL ESTERS 1 G PO CAPS  Take 4 tablets by mouth daily. 120 capsule 2  . PREGABALIN 100 MG PO CAPS Oral Take 100 mg by mouth 3 (three) times daily.      Marland Kitchen SIMCOR 1000-20 MG PO TB24  TAKE 1 TABLET BY MOUTH AT BEDTIME. 30 tablet 2  . TADALAFIL 5 MG PO TABS Oral Take 5 mg by mouth daily as needed.      Marland Kitchen TAPENTADOL HCL 100 MG PO TABS Oral Take 1 tablet by mouth daily. 1 tablet six times a day.    . TRAZODONE HCL 50 MG PO TABS  Take 1/2 tablet by mouth at bedtime.     . INSULIN LISPRO (HUMAN) 100 UNIT/ML [place] SOLN Subcutaneous Inject 32 Units into the skin 3 (three) times daily before meals.     . AUTO-LANCET MISC  Check blood sugar 4 times daily.     . NON FORMULARY Injection Inject as directed. 3 x a week.   ALLERGY INJECTIONS.       BP 148/81  Pulse 104  Temp(Src) 97.5 F (36.4 C) (Oral)  Resp 24  SpO2 99%  Physical Exam  Nursing note and vitals reviewed. Constitutional: He is oriented to person, place, and time. He appears well-developed and well-nourished.  HENT:  Head: Normocephalic and atraumatic.  Eyes: Conjunctivae and EOM are normal.       Pupils about 7 mm bilaterally  Neck:       Tenderness on left side of neck at base of skull  Cardiovascular: Normal rate, regular rhythm and normal heart sounds.   Pulmonary/Chest: Effort normal and breath sounds normal.  Abdominal: Soft. Bowel sounds are normal.  Musculoskeletal: Normal range of motion.       4/5 strength in left hand grip. Normal grip in triceps and biceps. Normal pulses throughout.  Neurological: He is alert and oriented to person, place, and time.  Skin: Skin is warm and dry.  Psychiatric: He has a normal mood and affect.    ED Course  Procedures (including  critical care time) DIAGNOSTIC STUDIES: Oxygen Saturation is 99% on room air, normal by my interpretation.    COORDINATION OF CARE: 11:28PM-  Patient informed of current plan for treatment and evaluation and agrees with plan at this time.     Labs Reviewed  COMPREHENSIVE METABOLIC PANEL - Abnormal; Notable for the following:    Potassium 3.4 (*)    Glucose, Bld 170 (*)    All other components within normal limits  CBC  DIFFERENTIAL  URINALYSIS, ROUTINE W REFLEX MICROSCOPIC   Ct Head Wo Contrast  03/21/2012  *RADIOLOGY REPORT*  Clinical Data:  Status post fall.  Neck pain radiating into the head.  Left arm pain.  CT HEAD WITHOUT CONTRAST CT CERVICAL SPINE WITHOUT CONTRAST  Technique:  Multidetector CT imaging of the head and cervical spine was performed following the standard protocol without intravenous contrast.  Multiplanar CT image reconstructions of the cervical spine were also generated.  Comparison:  R I 07/26/2011.  CT HEAD  Findings: The brain appears normal without evidence of infarction, hemorrhage, mass lesion, mass effect, midline shift or abnormal extra-axial fluid collection.  No hydrocephalus or pneumocephalus. Mild ethmoid air cell disease on the left is noted.  Calvarium intact.  IMPRESSION: No acute finding.  CT CERVICAL SPINE  Findings: Vertebral body height and alignment are normal. Intervertebral disc space height is maintained.  No epidural hematoma is visualized.  Paraspinous soft tissue structures are unremarkable.  IMPRESSION: Negative exam.  Original Report Authenticated By: Bernadene Bell. Maricela Curet, M.D.   Ct Cervical Spine Wo Contrast  03/21/2012  *RADIOLOGY REPORT*  Clinical Data:  Status post fall.  Neck pain radiating into the head.  Left arm pain.  CT HEAD WITHOUT CONTRAST CT CERVICAL SPINE WITHOUT CONTRAST  Technique:  Multidetector CT imaging of the head and cervical spine was performed following the standard protocol without intravenous contrast.  Multiplanar CT  image reconstructions of the cervical spine were also generated.  Comparison:  R I 07/26/2011.  CT HEAD  Findings: The brain appears normal without evidence of infarction, hemorrhage, mass lesion, mass effect, midline shift or abnormal extra-axial fluid collection.  No hydrocephalus or pneumocephalus. Mild ethmoid air cell disease on the left is noted.  Calvarium intact.  IMPRESSION: No acute finding.  CT CERVICAL SPINE  Findings: Vertebral body height and alignment are normal. Intervertebral disc space height is maintained.  No epidural hematoma is visualized.  Paraspinous soft tissue structures are unremarkable.  IMPRESSION: Negative exam.  Original Report Authenticated By: Bernadene Bell. D'ALESSIO, M.D.     1. Fall   2. Chronic pain   3. Fibromyalgia       MDM  Patient with multiple medical problems including chronic pain syndrome seeing the pain clinic on nucynta, diabetes and cervical disc disease. He states that 2 days ago he became hypoglycemic causing him to syncopized. At that time he fell and hit his head and his had worsening pain since and his head and neck. Patient on exam today he is neurovascularly intact with mild weakness in the left hand however some of this seems volitional as with distraction the grips in bilateral hands are equal. Unclear what caused the patient's hypoglycemic episode as here his blood sugar is normal and he has had no further episodes. CBC and BMP are within normal limits other than hyperglycemia. Normal creatinine and no obvious reason for patient's hypoglycemic episode. Head CT was done due to the patient's recent, and persistent headache however was within normal limits. The C-spine was also within normal limits. Patient continued to have pain despite IV pain control. Discussed with him the importance of following up with the specialist  for epidural injection which has already been scheduled and call him with his pain clinic. It has not appeared to be any acute  issues at this time. I personally performed the services described in this documentation, which was scribed in my presence.  The recorded information has been reviewed and considered.       Gwyneth Sprout, MD 03/21/12 1610  Gwyneth Sprout, MD 03/21/12 [phone]

## 2012-03-21 LAB — COMPREHENSIVE METABOLIC PANEL
AST: 35 U/L (ref 0–37)
BUN: 7 mg/dL (ref 6–23)
CO2: 26 mEq/L (ref 19–32)
Calcium: 9.4 mg/dL (ref 8.4–10.5)
Chloride: 100 mEq/L (ref 96–112)
Creatinine, Ser: 0.8 mg/dL (ref 0.50–1.35)
GFR calc Af Amer: >90 mL/min (ref >90)
GFR calc non Af Amer: >90 mL/min (ref >90)
Glucose, Bld: 170 mg/dL — ABNORMAL HIGH (ref 70–99)
Total Bilirubin: 0.3 mg/dL (ref 0.3–1.2)

## 2012-03-21 LAB — URINALYSIS, ROUTINE W REFLEX MICROSCOPIC
Bilirubin Urine: NEGATIVE
Glucose, UA: NEGATIVE mg/dL
Ketones, ur: NEGATIVE mg/dL
Leukocytes, UA: NEGATIVE
Protein, ur: NEGATIVE mg/dL
pH: 6 (ref 5.0–8.0)

## 2012-03-21 MED ORDER — ONDANSETRON HCL 4 MG/2ML IJ SOLN
4.0000 mg | Freq: Once | INTRAMUSCULAR | Status: AC
  Administered 2012-03-21: 4 mg via INTRAVENOUS

## 2012-03-21 MED ORDER — ONDANSETRON HCL 4 MG PO TABS: 4.0000 mg | ORAL_TABLET | Freq: Four times a day (QID) | ORAL | Status: AC

## 2012-03-21 MED ORDER — HYDROMORPHONE HCL PF 1 MG/ML IJ SOLN
1.0000 mg | Freq: Once | INTRAMUSCULAR | Status: AC
Start: 2012-03-21 — End: 2012-03-21
  Administered 2012-03-21: 1 mg via INTRAVENOUS
  Filled 2012-03-21: qty 1

## 2012-03-21 MED ORDER — ONDANSETRON HCL 4 MG/2ML IJ SOLN
INTRAMUSCULAR | Status: AC
  Administered 2012-03-21: 4 mg via INTRAVENOUS
  Filled 2012-03-21: qty 2

## 2012-03-21 NOTE — Discharge Instructions (Signed)

## 2012-04-11 ENCOUNTER — Other Ambulatory Visit: Payer: Self-pay | Admitting: Family

## 2012-04-12 ENCOUNTER — Telehealth: Payer: Self-pay | Admitting: Family

## 2012-04-12 MED ORDER — NIACIN-SIMVASTATIN ER 1000-20 MG PO TB24: 1.0000 | ORAL_TABLET | Freq: Every day | ORAL | Status: DC

## 2012-04-12 NOTE — Telephone Encounter (Signed)
Refill- simcor 1,000-20mg  tablet. Take one tablet by mouth at bedtime. Qty 30 last fill 5.1.13

## 2012-04-12 NOTE — Telephone Encounter (Signed)
Refill sent to pharmacy #[id number] x 2 refills. 

## 2012-04-26 ENCOUNTER — Ambulatory Visit: Payer: No Typology Code available for payment source | Admitting: Family

## 2012-05-11 ENCOUNTER — Telehealth: Payer: Self-pay | Admitting: Family

## 2012-05-11 NOTE — Telephone Encounter (Signed)
Refill- lantus solostar 100 units/ml. Inject 40 units into the skin at bedtime. Qty 15.66ml last fill 6.2.13  Refill-lovaza 1gm capsule. Take 4 capsules by mouth daily. Qty 120 last fill 6.2.13  Refill- novolog flexpen syringe. Inject 30 units into the skin three times daily before meals. Qty 30 last fill 6.4.13

## 2012-05-12 ENCOUNTER — Ambulatory Visit (INDEPENDENT_AMBULATORY_CARE_PROVIDER_SITE_OTHER): Payer: No Typology Code available for payment source | Admitting: Family

## 2012-05-12 ENCOUNTER — Encounter: Payer: Self-pay | Admitting: Family

## 2012-05-12 VITALS — BP 112/80 | HR 93 | Temp 97.8°F | Resp 16 | Ht 71.0 in | Wt 251.1 lb

## 2012-05-12 DIAGNOSIS — E119 Type 2 diabetes mellitus without complications: Secondary | ICD-10-CM

## 2012-05-12 DIAGNOSIS — E118 Type 2 diabetes mellitus with unspecified complications: Secondary | ICD-10-CM

## 2012-05-12 DIAGNOSIS — E876 Hypokalemia: Secondary | ICD-10-CM

## 2012-05-12 LAB — BASIC METABOLIC PANEL
BUN: 6 mg/dL (ref 6–23)
Chloride: 97 mEq/L (ref 96–112)
Potassium: 4.4 mEq/L (ref 3.5–5.3)
Sodium: 135 mEq/L (ref 135–145)

## 2012-05-12 LAB — HEMOGLOBIN A1C
Hgb A1c MFr Bld: 8.2 % — ABNORMAL HIGH (ref <5.7)
Mean Plasma Glucose: 189 mg/dL — ABNORMAL HIGH (ref <117)

## 2012-05-12 MED ORDER — OMEGA-3-ACID ETHYL ESTERS 1 G PO CAPS: ORAL_CAPSULE | ORAL | Status: DC

## 2012-05-12 MED ORDER — INSULIN GLARGINE 100 UNIT/ML ~~LOC~~ SOLN: 44.0000 [IU] | Freq: Every day | SUBCUTANEOUS | Status: DC

## 2012-05-12 MED ORDER — INSULIN ASPART 100 UNIT/ML ~~LOC~~ SOLN: 25.0000 [IU] | Freq: Three times a day (TID) | SUBCUTANEOUS | Status: DC

## 2012-05-12 NOTE — Telephone Encounter (Signed)
Refills sent for 30 day supply x 2 refills each.

## 2012-05-12 NOTE — Progress Notes (Signed)
Subjective:    Patient ID: [name], male    DOB: [birth year], 36 y.o.   MRN: [id number]  HPI  Mr.  [name] is a 36 yr old male who presents today for follow up of his diabetes.    DM2-  Reports that he is on novolog 25-32 units AC meals,  "depending on what I am eating."  He takes 32 before dinner usually.  He is using lantus 44 units at bedtime.  AM sugars are 100-120, later in the day sugars generally about 140 post prandial.  He is no longer having any hypoglycemic events on this dosing schedule.  No longer following with endo as they no longer accept his insurance.    Review of Systems See HPI  Past Medical History  Diagnosis Date  . Allergy   . Asthma   . Depression   . GERD (gastroesophageal reflux disease)   . Hypertension   . Diabetes mellitus     Type II  . Hyperlipidemia   . Chronic pain disorder     Sees [facility]  . Urinary incontinence     detrusor instability  . Hypogonadism     History   Social History  . Marital Status: Married    Spouse Name: N/A    Number of Children: 2  . Years of Education: N/A   Occupational History  . Disabled     chronic pain   Social History Main Topics  . Smoking status: Current Everyday Smoker -- 0.3 packs/day    Types: Cigarettes  . Smokeless tobacco: Never Used  . Alcohol Use: No  . Drug Use: No     stopped cocaine--2003. Normal drug screen March 2008  . Sexually Active: Not on file   Other Topics Concern  . Not on file   Social History Narrative  . No narrative on file    Past Surgical History  Procedure Date  . Appendectomy   . Hernia repair   . Nasal sinus surgery 2008    Family History  Problem Relation Age of Onset  . Diabetes Mother   . Hypertension Mother   . Cirrhosis Mother   . Arthritis Father 49    osteoarthritis  . Diabetes Sister     borderline  . Heart disease Paternal Uncle 61    MI  . Heart disease Maternal Grandfather     late 60's--MI  . Cancer Paternal  Grandmother 48    lung  . Heart disease Paternal Grandfather 18    MI    Allergies  Allergen Reactions  . Ciprofloxacin     REACTION: Rash and itching  . Hydrocodone-Acetaminophen     REACTION: Rash and itching    Current Outpatient Prescriptions on File Prior to Visit  Medication Sig Dispense Refill  . aspirin 81 MG tablet Take 81 mg by mouth daily.        Marland Kitchen azelastine (ASTELIN) 137 MCG/SPRAY nasal spray 2 sprays by Nasal route every morning. Use in each nostril as directed       . baclofen (LIORESAL) 20 MG tablet Take 20 mg by mouth at bedtime.        . cetirizine (ZYRTEC) 10 MG tablet Take 10 mg by mouth daily.        . ciclesonide (OMNARIS) 50 MCG/ACT nasal spray 2 sprays by Each Nare route at bedtime as needed.        . clonazePAM (KLONOPIN) 2 MG tablet Take 2 mg by mouth 2 (two)  times daily as needed.        . CYMBALTA 60 MG capsule TAKE ONE CAPSULE BY MOUTH EVERY DAY  30 capsule  2  . ibuprofen (ADVIL,MOTRIN) 200 MG tablet Take 200 mg by mouth every 6 (six) hours as needed. Patient used this medication for his pain.      Marland Kitchen insulin lispro (HUMALOG) 100 UNIT/ML injection Inject 32 Units into the skin 3 (three) times daily before meals.       Dem[employer] (AUTO-LANCET) MISC Check blood sugar 4 times daily.       . metFORMIN (GLUCOPHAGE) 1000 MG tablet Take 1,000 mg by mouth 2 (two) times daily with meals.        . Multiple Vitamins-Minerals (MULTIVITAMIN WITH MINERALS) tablet Take 1 tablet by mouth daily.        . nabumetone (RELAFEN) 750 MG tablet Take 750 mg by mouth 2 (two) times daily.        . niacin-simvastatin (SIMCOR) 1000-20 MG 24 hr tablet Take 1 tablet by mouth at bedtime.  30 tablet  2  . NON FORMULARY Inject as directed. 3 x a week.   ALLERGY INJECTIONS.       Marland Kitchen pregabalin (LYRICA) 100 MG capsule Take 100 mg by mouth 3 (three) times daily.        . tadalafil (CIALIS) 5 MG tablet Take 5 mg by mouth daily as needed.        . Tapentadol HCl (NUCYNTA) 100 MG TABS  Take 1 tablet by mouth daily. 1 tablet six times a day.      . traZODone (DESYREL) 50 MG tablet Take 1/2 tablet by mouth at bedtime.       Marland Kitchen DISCONTD: insulin glargine (LANTUS) 100 UNIT/ML injection Inject 44 Units into the skin at bedtime.      Marland Kitchen DISCONTD: omega-3 acid ethyl esters (LOVAZA) 1 G capsule Take 4 tablets by mouth daily.  120 capsule  2  . insulin aspart (NOVOLOG) 100 UNIT/ML injection Inject 25-32 Units into the skin 3 (three) times daily before meals.  3 vial  2  . insulin glargine (LANTUS) 100 UNIT/ML injection Inject 44 Units into the skin at bedtime.  20 mL  2  . omega-3 acid ethyl esters (LOVAZA) 1 G capsule Take 4 tablets by mouth daily.  120 capsule  2  . DISCONTD: albuterol (PROVENTIL) 90 MCG/ACT inhaler Inhale 2 puffs into the lungs every 6 (six) hours as needed for wheezing.  17 g  12    BP 112/80  Pulse 93  Temp 97.8 F (36.6 C) (Oral)  Resp 16  Ht 5\' 11"  (1.803 m)  Wt 251 lb 1.3 oz (113.889 kg)  BMI 35.02 kg/m2  SpO2 98%       Objective:   Physical Exam  Constitutional: He appears well-developed and well-nourished. No distress.  Cardiovascular: Normal rate and regular rhythm.   No murmur heard. Pulmonary/Chest: Effort normal and breath sounds normal. No respiratory distress. He has no wheezes. He has no rales. He exhibits no tenderness.  Musculoskeletal: He exhibits no edema.  Psychiatric: He has a normal mood and affect. His behavior is normal. Judgment and thought content normal.          Assessment & Plan:

## 2012-05-12 NOTE — Assessment & Plan Note (Signed)
Improved.  Continue current insulin dosing.  Obtain urine microalbumin and A1C.

## 2012-05-12 NOTE — Patient Instructions (Addendum)
Please schedule a follow up appointment in 3 months.

## 2012-05-13 LAB — MICROALBUMIN / CREATININE URINE RATIO
Creatinine, Urine: 39.5 mg/dL
Microalb Creat Ratio: 12.7 mg/g (ref 0.0–30.0)
Microalb, Ur: 0.5 mg/dL (ref 0.00–1.89)

## 2012-05-14 ENCOUNTER — Telehealth: Payer: Self-pay | Admitting: Family

## 2012-05-14 NOTE — Telephone Encounter (Signed)
Pls call pt and let him know that A1C is improving but still above goal.  He should increase his lantus by 3 units from 44 units to 47 units please.

## 2012-05-14 NOTE — Telephone Encounter (Signed)
Patient sleeping, spoke w/wife [name] , gave new Lantus instructions. Also, informed that we had received his request via [facility] to have his insulin dispensed in 'pens', instead of vials, and this was approved via his PCP; new Rx[s] have been given to pharmacy w/new instructions on Lantus/SLS

## 2012-06-14 ENCOUNTER — Telehealth: Payer: Self-pay | Admitting: Family

## 2012-06-14 NOTE — Telephone Encounter (Signed)
Novolog vials were removed from pt's medication list as he verified that he is currently using the pen. Rx sent for novolog flex pen. Also verified that he is not taking Humalog presently and this was removed from his medication list as well.

## 2012-06-14 NOTE — Addendum Note (Signed)
Addended by: Mervin Kung A on: 06/14/2012 11:52 AM   Modules accepted: Orders

## 2012-06-29 ENCOUNTER — Emergency Department (HOSPITAL_BASED_OUTPATIENT_CLINIC_OR_DEPARTMENT_OTHER)
Admission: EM | Admit: 2012-06-29 | Discharge: 2012-06-30 | Disposition: A | Discharge status: Home / Self Care | Payer: No Typology Code available for payment source | Attending: Emergency Medicine | Admitting: Emergency Medicine

## 2012-06-29 ENCOUNTER — Encounter (HOSPITAL_BASED_OUTPATIENT_CLINIC_OR_DEPARTMENT_OTHER): Payer: Self-pay | Admitting: *Deleted

## 2012-06-29 DIAGNOSIS — K219 Gastro-esophageal reflux disease without esophagitis: Secondary | ICD-10-CM | POA: Insufficient documentation

## 2012-06-29 DIAGNOSIS — Z9089 Acquired absence of other organs: Secondary | ICD-10-CM | POA: Insufficient documentation

## 2012-06-29 DIAGNOSIS — F17200 Nicotine dependence, unspecified, uncomplicated: Secondary | ICD-10-CM | POA: Insufficient documentation

## 2012-06-29 DIAGNOSIS — Z79899 Other long term (current) drug therapy: Secondary | ICD-10-CM | POA: Insufficient documentation

## 2012-06-29 DIAGNOSIS — J45909 Unspecified asthma, uncomplicated: Secondary | ICD-10-CM | POA: Insufficient documentation

## 2012-06-29 DIAGNOSIS — F3289 Other specified depressive episodes: Secondary | ICD-10-CM | POA: Insufficient documentation

## 2012-06-29 DIAGNOSIS — Z794 Long term (current) use of insulin: Secondary | ICD-10-CM | POA: Insufficient documentation

## 2012-06-29 DIAGNOSIS — E785 Hyperlipidemia, unspecified: Secondary | ICD-10-CM | POA: Insufficient documentation

## 2012-06-29 DIAGNOSIS — F329 Major depressive disorder, single episode, unspecified: Secondary | ICD-10-CM

## 2012-06-29 DIAGNOSIS — I10 Essential (primary) hypertension: Secondary | ICD-10-CM | POA: Insufficient documentation

## 2012-06-29 DIAGNOSIS — Z9109 Other allergy status, other than to drugs and biological substances: Secondary | ICD-10-CM | POA: Insufficient documentation

## 2012-06-29 DIAGNOSIS — F32A Depression, unspecified: Secondary | ICD-10-CM

## 2012-06-29 DIAGNOSIS — E119 Type 2 diabetes mellitus without complications: Secondary | ICD-10-CM | POA: Insufficient documentation

## 2012-06-29 DIAGNOSIS — R45851 Suicidal ideations: Secondary | ICD-10-CM | POA: Insufficient documentation

## 2012-06-29 LAB — CBC WITH DIFFERENTIAL/PLATELET
Basophils Absolute: 0 10*3/uL (ref 0.0–0.1)
Basophils Relative: 0 % (ref 0–1)
HCT: 42.8 % (ref 39.0–52.0)
Lymphocytes Relative: 21 % (ref 12–46)
MCHC: 35.3 g/dL (ref 30.0–36.0)
Monocytes Absolute: 0.7 10*3/uL (ref 0.1–1.0)
Neutro Abs: 5.6 10*3/uL (ref 1.7–7.7)
Platelets: 213 10*3/uL (ref 150–400)
RDW: 12.7 % (ref 11.5–15.5)
WBC: 8.3 10*3/uL (ref 4.0–10.5)

## 2012-06-29 LAB — RAPID URINE DRUG SCREEN, HOSP PERFORMED
Amphetamines: NOT DETECTED
Barbiturates: NOT DETECTED
Cocaine: NOT DETECTED
Opiates: NOT DETECTED
Tetrahydrocannabinol: NOT DETECTED

## 2012-06-29 LAB — COMPREHENSIVE METABOLIC PANEL
ALT: 28 U/L (ref 0–53)
AST: 21 U/L (ref 0–37)
Albumin: 4.3 g/dL (ref 3.5–5.2)
Calcium: 9.9 mg/dL (ref 8.4–10.5)
Chloride: 97 mEq/L (ref 96–112)
Creatinine, Ser: 0.8 mg/dL (ref 0.50–1.35)
Sodium: 136 mEq/L (ref 135–145)
Total Bilirubin: 0.3 mg/dL (ref 0.3–1.2)

## 2012-06-29 MED ORDER — NICOTINE 21 MG/24HR TD PT24
MEDICATED_PATCH | TRANSDERMAL | Status: AC
  Administered 2012-06-29: 21 mg
  Filled 2012-06-29: qty 1

## 2012-06-29 MED ORDER — ZIPRASIDONE MESYLATE 20 MG IM SOLR
INTRAMUSCULAR | Status: AC
  Administered 2012-06-29: 20 mg via INTRAMUSCULAR
  Filled 2012-06-29: qty 20

## 2012-06-29 MED ORDER — ZIPRASIDONE MESYLATE 20 MG IM SOLR
20.0000 mg | Freq: Once | INTRAMUSCULAR | Status: AC
  Administered 2012-06-29: 20 mg via INTRAMUSCULAR

## 2012-06-29 NOTE — ED Provider Notes (Signed)
History     CSN: [id number]  Arrival date & time 06/29/12  2139   First MD Initiated Contact with Patient 06/29/12 2249      Chief Complaint  Patient presents with  . Suicidal    (Consider location/radiation/quality/duration/timing/severity/associated sxs/prior treatment) HPI This is a 36 year old white male with a history of depression dating back as far as November of last year. The symptoms have worsened over the past several weeks and particularly the past several days and are now moderate to severe in intensity. He has been having thoughts of suicide with consideration of hanging himself with a rope or overdosing on his pain medications. He states that voices in his head, which he states sounds like his own voice, are assisting him in suggesting a commit suicide. He has been emotionally labile with periods of rage periods of grief. He is on Klonopin and Cymbalta but these are not controlling his symptoms. He has been sleeping excessively.  He has agoraphobia and feels paranoid and anxious at the thought of leaving his house. He states it took an effort to come to the ED this evening. He denies chest pain or abdominal pain but continues to have his chronic pain in his muscles particularly of the legs. He states he has a history of illicit substance abuse but has not engaged in activity recently. He states he has been taking his pain medications only as prescribed and is followed by pain management clinic.  Past Medical History  Diagnosis Date  . Allergy   . Asthma   . Depression   . GERD (gastroesophageal reflux disease)   . Hypertension   . Diabetes mellitus     Type II  . Hyperlipidemia   . Chronic pain disorder     Sees [facility]  . Urinary incontinence     detrusor instability  . Hypogonadism     Past Surgical History  Procedure Date  . Appendectomy   . Hernia repair   . Nasal sinus surgery 2008    Family History  Problem Relation Age of Onset  .  Diabetes Mother   . Hypertension Mother   . Cirrhosis Mother   . Arthritis Father 49    osteoarthritis  . Diabetes Sister     borderline  . Heart disease Paternal Uncle 37    MI  . Heart disease Maternal Grandfather     late 60's--MI  . Cancer Paternal Grandmother 61    lung  . Heart disease Paternal Grandfather 37    MI    History  Substance Use Topics  . Smoking status: Current Everyday Smoker -- 0.3 packs/day    Types: Cigarettes  . Smokeless tobacco: Never Used  . Alcohol Use: No      Review of Systems  All other systems reviewed and are negative.    Allergies  Ciprofloxacin and Hydrocodone-acetaminophen  Home Medications   Current Outpatient Rx  Name Route Sig Dispense Refill  . AZELASTINE HCL 137 MCG/SPRAY NA SOLN Nasal Place 2 sprays into the nose every morning. Use in each nostril as directed    . BACLOFEN 20 MG PO TABS Oral Take 20 mg by mouth at bedtime.      Marland Kitchen CLONAZEPAM 2 MG PO TABS Oral Take 2 mg by mouth 2 (two) times daily as needed.      . CYMBALTA 60 MG PO CPEP  TAKE ONE CAPSULE BY MOUTH EVERY DAY 30 capsule 2  . INSULIN ASPART 100 UNIT/ML [place] SOLN Subcutaneous  Inject 25-32 Units into the skin 3 (three) times daily before meals. Patient used 28 units yesterday before his meal.    . INSULIN ASPART 100 UNIT/ML [place] SOLN Subcutaneous Inject 28 Units into the skin 3 (three) times daily before meals.    . INSULIN GLARGINE 100 UNIT/ML [place] SOLN Subcutaneous Inject 44 Units into the skin at bedtime.     . AUTO-LANCET MISC  Check blood sugar 4 times daily.     Marland Kitchen METFORMIN HCL 1000 MG PO TABS Oral Take 1,000 mg by mouth 2 (two) times daily with meals.      Marland Kitchen NABUMETONE 750 MG PO TABS Oral Take 750 mg by mouth 2 (two) times daily.      Marland Kitchen NIACIN-SIMVASTATIN ER 1000-20 MG PO TB24 Oral Take 1 tablet by mouth at bedtime. 30 tablet 2  . OMEGA-3-ACID ETHYL ESTERS 1 G PO CAPS  Take 4 tablets by mouth daily. 120 capsule 2  . PREGABALIN 100 MG PO CAPS Oral Take 100 mg by  mouth 3 (three) times daily.      Marland Kitchen TAPENTADOL HCL 100 MG PO TABS Oral Take 1 tablet by mouth daily. 1 tablet six times a day.    Marland Kitchen CETIRIZINE HCL 10 MG PO TABS Oral Take 10 mg by mouth daily.        BP 142/82  Pulse 120  Temp 97.8 F (36.6 C) (Oral)  Resp 20  Ht 6\' 1"  (1.854 m)  Wt 260 lb (117.935 kg)  BMI 34.30 kg/m2  SpO2 98%  Physical Exam General: Well-developed, well-nourished male in no acute distress; appearance consistent with age of record HENT: normocephalic, atraumatic Eyes: pupils equal round and reactive to light; extraocular muscles intact Neck: supple Heart: regular rate and rhythm Lungs: clear to auscultation bilaterally Abdomen: soft; nondistended; nontender; no masses or hepatosplenomegaly; bowel sounds present Extremities: No deformity; full range of motion Neurologic: Awake, alert; motor function intact in all extremities and symmetric; no facial droop Skin: Warm and dry Psychiatric: Anxious; depressed; suicidal ideation; auditory hallucinations; emotional lability; paranoia    ED Course  Procedures (including critical care time)     MDM   Nursing notes and vitals signs, including pulse oximetry, reviewed.  Summary of this visit's results, reviewed by myself:  Labs:  Results for orders placed during the hospital encounter of 06/29/12  CBC WITH DIFFERENTIAL      Component Value Range   WBC 8.3  4.0 - 10.5 K/uL   RBC 4.89  4.22 - 5.81 MIL/uL   Hemoglobin 15.1  13.0 - 17.0 g/dL   HCT 54.0  98.1 - 19.1 %   MCV 87.5  78.0 - 100.0 fL   MCH 30.9  26.0 - 34.0 pg   MCHC 35.3  30.0 - 36.0 g/dL   RDW 47.8  29.5 - 62.1 %   Platelets 213  150 - 400 K/uL   Neutrophils Relative 67  43 - 77 %   Neutro Abs 5.6  1.7 - 7.7 K/uL   Lymphocytes Relative 21  12 - 46 %   Lymphs Abs 1.8  0.7 - 4.0 K/uL   Monocytes Relative 9  3 - 12 %   Monocytes Absolute 0.7  0.1 - 1.0 K/uL   Eosinophils Relative 3  0 - 5 %   Eosinophils Absolute 0.2  0.0 - 0.7 K/uL    Basophils Relative 0  0 - 1 %   Basophils Absolute 0.0  0.0 - 0.1 K/uL  COMPREHENSIVE METABOLIC PANEL  Component Value Range   Sodium 136  135 - 145 mEq/L   Potassium 4.6  3.5 - 5.1 mEq/L   Chloride 97  96 - 112 mEq/L   CO2 29  19 - 32 mEq/L   Glucose, Bld 283 (*) 70 - 99 mg/dL   BUN 8  6 - 23 mg/dL   Creatinine, Ser 1.47  0.50 - 1.35 mg/dL   Calcium 9.9  8.4 - 82.9 mg/dL   Total Protein 7.3  6.0 - 8.3 g/dL   Albumin 4.3  3.5 - 5.2 g/dL   AST 21  0 - 37 U/L   ALT 28  0 - 53 U/L   Alkaline Phosphatase 83  39 - 117 U/L   Total Bilirubin 0.3  0.3 - 1.2 mg/dL   GFR calc non Af Amer >90  >90 mL/min   GFR calc Af Amer >90  >90 mL/min  ETHANOL      Component Value Range   Alcohol, Ethyl (B) <11  0 - 11 mg/dL  URINE RAPID DRUG SCREEN (HOSP PERFORMED)      Component Value Range   Opiates NONE DETECTED  NONE DETECTED   Cocaine NONE DETECTED  NONE DETECTED   Benzodiazepines NONE DETECTED  NONE DETECTED   Amphetamines NONE DETECTED  NONE DETECTED   Tetrahydrocannabinol NONE DETECTED  NONE DETECTED   Barbiturates NONE DETECTED  NONE DETECTED  ACETAMINOPHEN LEVEL      Component Value Range   Acetaminophen (Tylenol), Serum <15.0  10 - 30 ug/mL  SALICYLATE LEVEL      Component Value Range   Salicylate Lvl <2.0 (*) 2.8 - 20.0 mg/dL   56:21 AM Patient more relaxed after Geodon 20 mg IM. [facility] to evaluate patient in ED.  6:54 AM The patient slept quietly overnight without incident. Orders have been entered for his glycemic control. He is awaiting acceptance an inpatient psychiatric facility at this time.   Hanley Seamen, MD 06/30/12 [phone]

## 2012-06-29 NOTE — ED Notes (Signed)
Pt c/o suicidal thought x 2 weeks , hearing voices and " im paranoid"  Hx depression and anxiety

## 2012-06-30 ENCOUNTER — Telehealth: Payer: Self-pay | Admitting: *Deleted

## 2012-06-30 LAB — GLUCOSE, CAPILLARY: Glucose-Capillary: 290 mg/dL — ABNORMAL HIGH (ref 70–99)

## 2012-06-30 MED ORDER — INSULIN GLARGINE 100 UNIT/ML ~~LOC~~ SOLN: 44.0000 [IU] | Freq: Every day | SUBCUTANEOUS | Status: DC

## 2012-06-30 MED ORDER — METFORMIN HCL 500 MG PO TABS
1000.0000 mg | ORAL_TABLET | Freq: Two times a day (BID) | ORAL | Status: DC
  Administered 2012-06-30: 1000 mg via ORAL
  Filled 2012-06-30: qty 1

## 2012-06-30 MED ORDER — LORAZEPAM 1 MG PO TABS
2.0000 mg | ORAL_TABLET | Freq: Three times a day (TID) | ORAL | Status: DC | PRN
  Administered 2012-06-30: 2 mg via ORAL
  Filled 2012-06-30: qty 2

## 2012-06-30 MED ORDER — LORAZEPAM 1 MG PO TABS: 2.0000 mg | ORAL_TABLET | Freq: Three times a day (TID) | ORAL | Status: DC

## 2012-06-30 MED ORDER — LORAZEPAM 2 MG/ML IJ SOLN
INTRAMUSCULAR | Status: AC
  Administered 2012-06-30: 2 mg
  Filled 2012-06-30: qty 1

## 2012-06-30 MED ORDER — INSULIN ASPART 100 UNIT/ML ~~LOC~~ SOLN
25.0000 [IU] | Freq: Three times a day (TID) | SUBCUTANEOUS | Status: DC
  Administered 2012-06-30: 28 [IU] via SUBCUTANEOUS
  Filled 2012-06-30: qty 3

## 2012-06-30 MED ORDER — ACETAMINOPHEN 325 MG PO TABS: 325.0000 mg | ORAL_TABLET | Freq: Once | ORAL | Status: DC

## 2012-06-30 NOTE — Telephone Encounter (Signed)
Pt LMOM requesting Rx maintenance on pain medication d/t missing appointment this morning with [facility] post d/c from ED

## 2012-06-30 NOTE — Telephone Encounter (Signed)
Had Triage email 08.21.13 [[facility]] reporting pt sent to [facility] ED last night for Depression & thoughts of suicide: Phoned patient to schedule ED follow-up [08.20.13] OV per Sandford Craze; spoke with [name]/EC and scheduled 08.28.13 appt at 11:15am/SLS

## 2012-06-30 NOTE — Telephone Encounter (Signed)
Return call to pt [spoke w/[name] once again] to inform that if PCP takes over mgt of pain medication, he would be d[facility]c [facility]; so as to avoid this, [name] has spoken w/24 on-call service for controlled substance inquiries at [facility] & they should be contacting patient re: medication & rescheduling of missed appointment this am/SLS

## 2012-06-30 NOTE — ED Provider Notes (Signed)
Patient states that he wishes to go home. He states he is looking forward to his son's football game on Friday night. He states he has had increased depression over the past several weeks and although he was more depressed last night he states that he feels much improved today. He states he has no desire to harm himself. He states he was light to be set up to as an outpatient. Patient specifically denies suicidal or homicidal ideation. He feels hopeful. He is looking forward to things in the future. I have re\re consult said the mobile crisis team and they have set him up with outpatient followup.  Hilario Quarry, MD 06/30/12 [phone]

## 2012-06-30 NOTE — ED Notes (Signed)
Patient request ativan due to agitation, 2mg  given per prn order & stated he felt better afterwards

## 2012-07-01 ENCOUNTER — Emergency Department (HOSPITAL_BASED_OUTPATIENT_CLINIC_OR_DEPARTMENT_OTHER)
Admission: EM | Admit: 2012-07-01 | Discharge: 2012-07-01 | Disposition: A | Discharge status: Home / Self Care | Payer: No Typology Code available for payment source | Attending: Emergency Medicine | Admitting: Emergency Medicine

## 2012-07-01 ENCOUNTER — Encounter (HOSPITAL_BASED_OUTPATIENT_CLINIC_OR_DEPARTMENT_OTHER): Payer: Self-pay | Admitting: *Deleted

## 2012-07-01 DIAGNOSIS — F17200 Nicotine dependence, unspecified, uncomplicated: Secondary | ICD-10-CM | POA: Insufficient documentation

## 2012-07-01 DIAGNOSIS — M549 Dorsalgia, unspecified: Secondary | ICD-10-CM | POA: Insufficient documentation

## 2012-07-01 DIAGNOSIS — E119 Type 2 diabetes mellitus without complications: Secondary | ICD-10-CM | POA: Insufficient documentation

## 2012-07-01 DIAGNOSIS — G8929 Other chronic pain: Secondary | ICD-10-CM | POA: Insufficient documentation

## 2012-07-01 DIAGNOSIS — E785 Hyperlipidemia, unspecified: Secondary | ICD-10-CM | POA: Insufficient documentation

## 2012-07-01 DIAGNOSIS — Z79899 Other long term (current) drug therapy: Secondary | ICD-10-CM | POA: Insufficient documentation

## 2012-07-01 DIAGNOSIS — G894 Chronic pain syndrome: Secondary | ICD-10-CM

## 2012-07-01 DIAGNOSIS — K219 Gastro-esophageal reflux disease without esophagitis: Secondary | ICD-10-CM | POA: Insufficient documentation

## 2012-07-01 DIAGNOSIS — Z794 Long term (current) use of insulin: Secondary | ICD-10-CM | POA: Insufficient documentation

## 2012-07-01 MED ORDER — ONDANSETRON 8 MG PO TBDP
8.0000 mg | ORAL_TABLET | Freq: Once | ORAL | Status: AC
  Administered 2012-07-01: 8 mg via ORAL
  Filled 2012-07-01: qty 1

## 2012-07-01 MED ORDER — KETOROLAC TROMETHAMINE 60 MG/2ML IM SOLN
60.0000 mg | Freq: Once | INTRAMUSCULAR | Status: AC
  Administered 2012-07-01: 60 mg via INTRAMUSCULAR
  Filled 2012-07-01: qty 2

## 2012-07-01 MED ORDER — METHOCARBAMOL 500 MG PO TABS
1000.0000 mg | ORAL_TABLET | Freq: Once | ORAL | Status: AC
  Administered 2012-07-01: 1000 mg via ORAL
  Filled 2012-07-01: qty 2

## 2012-07-01 NOTE — ED Notes (Signed)
Pt seen here yesterday for chronic pain- was unable to go to pain clinic appt to day due to being in ED- pt has appt for Thursday morning but has not had any pain medicine- requesting pain med at this time until he can go to appt

## 2012-07-01 NOTE — ED Provider Notes (Signed)
History     CSN: [id number]  Arrival date & time 07/01/12  0028   First MD Initiated Contact with Patient 07/01/12 0046      Chief Complaint  Patient presents with  . Back Pain    (Consider location/radiation/quality/duration/timing/severity/associated sxs/prior treatment) Patient is a 36 y.o. male presenting with back pain. The history is provided by the patient. No language interpreter was used.  Back Pain  This is a chronic problem. The current episode started more than 1 week ago. The problem occurs constantly. The problem has not changed since onset.The pain is associated with no known injury. The pain is present in the thoracic spine, lumbar spine and sacro-iliac joint (and entire body as per his typical chronic pain syndrome). The quality of the pain is described as stabbing. The pain does not radiate. The pain is at a severity of 10/10. The pain is severe. Exacerbated by: was her so long last night he missed his pain appt. The pain is the same all the time. Pertinent negatives include no chest pain, no fever, no numbness, no weight loss, no headaches, no abdominal pain, no abdominal swelling, no bowel incontinence, no perianal numbness, no bladder incontinence, no dysuria, no leg pain, no paresthesias, no paresis, no tingling and no weakness. He has tried nothing for the symptoms. The treatment provided no relief. Risk factors: none.    Past Medical History  Diagnosis Date  . Allergy   . Asthma   . Depression   . GERD (gastroesophageal reflux disease)   . Hypertension   . Diabetes mellitus     Type II  . Hyperlipidemia   . Chronic pain disorder     Sees [facility]  . Urinary incontinence     detrusor instability  . Hypogonadism     Past Surgical History  Procedure Date  . Appendectomy   . Hernia repair   . Nasal sinus surgery 2008    Family History  Problem Relation Age of Onset  . Diabetes Mother   . Hypertension Mother   . Cirrhosis Mother   .  Arthritis Father 49    osteoarthritis  . Diabetes Sister     borderline  . Heart disease Paternal Uncle 52    MI  . Heart disease Maternal Grandfather     late 60's--MI  . Cancer Paternal Grandmother 17    lung  . Heart disease Paternal Grandfather 50    MI    History  Substance Use Topics  . Smoking status: Current Everyday Smoker -- 0.3 packs/day    Types: Cigarettes  . Smokeless tobacco: Never Used  . Alcohol Use: No      Review of Systems  Constitutional: Negative for fever and weight loss.  HENT: Negative for neck pain and neck stiffness.   Respiratory: Negative for shortness of breath.   Cardiovascular: Negative for chest pain, palpitations and leg swelling.  Gastrointestinal: Negative for abdominal pain and bowel incontinence.  Genitourinary: Negative for bladder incontinence and dysuria.  Musculoskeletal: Positive for back pain. Negative for joint swelling and gait problem.  Neurological: Negative for tingling, weakness, numbness, headaches and paresthesias.  All other systems reviewed and are negative.    Allergies  Ciprofloxacin and Hydrocodone-acetaminophen  Home Medications   Current Outpatient Rx  Name Route Sig Dispense Refill  . AZELASTINE HCL 137 MCG/SPRAY NA SOLN Nasal Place 2 sprays into the nose every morning. Use in each nostril as directed    . BACLOFEN 20 MG PO TABS  Oral Take 20 mg by mouth at bedtime.      Marland Kitchen CETIRIZINE HCL 10 MG PO TABS Oral Take 10 mg by mouth daily.      Marland Kitchen CLONAZEPAM 2 MG PO TABS Oral Take 2 mg by mouth 2 (two) times daily as needed.      . CYMBALTA 60 MG PO CPEP  TAKE ONE CAPSULE BY MOUTH EVERY DAY 30 capsule 2  . INSULIN ASPART 100 UNIT/ML [place] SOLN Subcutaneous Inject 25-32 Units into the skin 3 (three) times daily before meals. Patient used 28 units yesterday before his meal.    . INSULIN ASPART 100 UNIT/ML [place] SOLN Subcutaneous Inject 28 Units into the skin 3 (three) times daily before meals.    . INSULIN GLARGINE 100  UNIT/ML [place] SOLN Subcutaneous Inject 44 Units into the skin at bedtime.     . AUTO-LANCET MISC  Check blood sugar 4 times daily.     Marland Kitchen METFORMIN HCL 1000 MG PO TABS Oral Take 1,000 mg by mouth 2 (two) times daily with meals.      Marland Kitchen NABUMETONE 750 MG PO TABS Oral Take 750 mg by mouth 2 (two) times daily.      Marland Kitchen NIACIN-SIMVASTATIN ER 1000-20 MG PO TB24 Oral Take 1 tablet by mouth at bedtime. 30 tablet 2  . OMEGA-3-ACID ETHYL ESTERS 1 G PO CAPS  Take 4 tablets by mouth daily. 120 capsule 2  . PREGABALIN 100 MG PO CAPS Oral Take 100 mg by mouth 3 (three) times daily.      Marland Kitchen TAPENTADOL HCL 100 MG PO TABS Oral Take 1 tablet by mouth daily. 1 tablet six times a day.      BP 124/85  Pulse 92  Temp 97.7 F (36.5 C) (Oral)  Resp 20  Ht 6\' 1"  (1.854 m)  Wt 260 lb (117.935 kg)  BMI 34.30 kg/m2  SpO2 99%  Physical Exam  Constitutional: He appears well-developed and well-nourished. No distress.  HENT:  Head: Normocephalic and atraumatic.  Mouth/Throat: Oropharynx is clear and moist.  Eyes: Conjunctivae and EOM are normal. Pupils are equal, round, and reactive to light.  Neck: Normal range of motion. Neck supple.  Cardiovascular: Normal rate, regular rhythm and intact distal pulses.   Pulmonary/Chest: Effort normal and breath sounds normal.  Abdominal: Soft. Bowel sounds are normal. There is no tenderness. There is no rebound and no guarding.  Musculoskeletal: Normal range of motion.  Neurological: He has normal reflexes. He displays normal reflexes. He exhibits normal muscle tone.       L5/s1 intact intact perineal sensation  Skin: Skin is warm and dry.  Psychiatric: He has a normal mood and affect.    ED Course  Procedures (including critical care time)  Labs Reviewed - No data to display No results found.   No diagnosis found.    MDM  Will given one dose of medication.  Patient states this is typical of his pain and has appt with pain clinic this am.  No indication for labs as  were done within 24 hours nor imaging        Olar Santini Smitty Cords, MD 07/01/12 [phone]

## 2012-07-07 ENCOUNTER — Telehealth: Payer: Self-pay | Admitting: *Deleted

## 2012-07-07 ENCOUNTER — Ambulatory Visit: Payer: No Typology Code available for payment source | Admitting: Family

## 2012-07-07 NOTE — Telephone Encounter (Signed)
confidential Office Message [address] [place], [place] [address] p. [phone] f. [phone] To: Lorrin Mais Fax: [phone] From: [facility] Date/ Time: 07/06/2012 5:48 PM Taken By: Ree Shay, CSR Caller: Tinnie Gens Facility: not collected Patient: [name], [name] DOB: August 08, 1976 Phone: [phone] Reason for Call: See info below Regarding Appointment: Yes Appt Date: 07/07/2012 Appt Time: 11:15:00 AM Provider: Sandford Craze (Adults only) Reason: Cancel Appointment Details: Pt will call back tomorrow to reschedule this appt. Outcome: Cancelled

## 2012-07-11 ENCOUNTER — Other Ambulatory Visit: Payer: Self-pay | Admitting: Family

## 2012-07-20 ENCOUNTER — Encounter: Payer: Self-pay | Admitting: Family

## 2012-07-20 ENCOUNTER — Ambulatory Visit (INDEPENDENT_AMBULATORY_CARE_PROVIDER_SITE_OTHER): Payer: No Typology Code available for payment source | Admitting: Family

## 2012-07-20 VITALS — BP 120/86 | HR 106 | Temp 98.1°F | Resp 16 | Wt 242.0 lb

## 2012-07-20 DIAGNOSIS — J329 Chronic sinusitis, unspecified: Secondary | ICD-10-CM

## 2012-07-20 MED ORDER — CEFUROXIME AXETIL 500 MG PO TABS: 500.0000 mg | ORAL_TABLET | Freq: Two times a day (BID) | ORAL | Status: AC

## 2012-07-20 NOTE — Patient Instructions (Addendum)

## 2012-07-20 NOTE — Progress Notes (Signed)
Subjective:    Patient ID: [name], male    DOB: 05/05/1976, 36 y.o.   MRN: [id number]  HPI  Pt is a 36 yr old male who presents today with chief complaint of facial pressure and hoarseness.  Started Thursday.  Has associated hoarseness, wheezing, headache.  Denies associated fever.  Using allergy med with mild improvement in his symptoms. + associated dry cough.    Review of Systems See HPI  Past Medical History  Diagnosis Date  . Allergy   . Asthma   . Depression   . GERD (gastroesophageal reflux disease)   . Hypertension   . Diabetes mellitus     Type II  . Hyperlipidemia   . Chronic pain disorder     Sees [facility]  . Urinary incontinence     detrusor instability  . Hypogonadism     History   Social History  . Marital Status: Married    Spouse Name: N/A    Number of Children: 2  . Years of Education: N/A   Occupational History  . Disabled     chronic pain   Social History Main Topics  . Smoking status: Current Everyday Smoker -- 0.3 packs/day    Types: Cigarettes  . Smokeless tobacco: Never Used  . Alcohol Use: No  . Drug Use: No     stopped cocaine--2003. Normal drug screen March 2008  . Sexually Active: Not on file   Other Topics Concern  . Not on file   Social History Narrative  . No narrative on file    Past Surgical History  Procedure Date  . Appendectomy   . Hernia repair   . Nasal sinus surgery 2008    Family History  Problem Relation Age of Onset  . Diabetes Mother   . Hypertension Mother   . Cirrhosis Mother   . Arthritis Father 49    osteoarthritis  . Diabetes Sister     borderline  . Heart disease Paternal Uncle 62    MI  . Heart disease Maternal Grandfather     late 60's--MI  . Cancer Paternal Grandmother 67    lung  . Heart disease Paternal Grandfather 41    MI    Allergies  Allergen Reactions  . Ciprofloxacin     REACTION: Rash and itching  . Hydrocodone-Acetaminophen     REACTION: Rash  and itching- PT STATES NOT ALLERGIC    Current Outpatient Prescriptions on File Prior to Visit  Medication Sig Dispense Refill  . azelastine (ASTELIN) 137 MCG/SPRAY nasal spray Place 2 sprays into the nose every morning. Use in each nostril as directed      . cetirizine (ZYRTEC) 10 MG tablet Take 10 mg by mouth daily.        . clonazePAM (KLONOPIN) 2 MG tablet Take 2 mg by mouth 2 (two) times daily as needed.        . CYMBALTA 60 MG capsule TAKE ONE CAPSULE BY MOUTH EVERY DAY  30 capsule  1  . insulin aspart (NOVOLOG FLEXPEN) 100 UNIT/ML injection Inject 28 Units into the skin 3 (three) times daily before meals.  30 Syringe  1  . insulin aspart (NOVOLOG) 100 UNIT/ML injection Inject 25-32 Units into the skin 3 (three) times daily before meals. Patient used 28 units yesterday before his meal.      . insulin aspart (NOVOLOG) 100 UNIT/ML injection Inject 28 Units into the skin 3 (three) times daily before meals.      Marland Kitchen  insulin glargine (LANTUS) 100 UNIT/ML injection Inject 44 Units into the skin at bedtime.       Dem[employer] (AUTO-LANCET) MISC Check blood sugar 4 times daily.       . metFORMIN (GLUCOPHAGE) 1000 MG tablet Take 1,000 mg by mouth 2 (two) times daily with meals.        . nabumetone (RELAFEN) 750 MG tablet Take 750 mg by mouth 2 (two) times daily.        Marland Kitchen omega-3 acid ethyl esters (LOVAZA) 1 G capsule Take 4 tablets by mouth daily.  120 capsule  2  . pregabalin (LYRICA) 100 MG capsule Take 100 mg by mouth 3 (three) times daily.        Marland Kitchen SIMCOR 1000-20 MG 24 hr tablet TAKE 1 TABLET BY MOUTH AT BEDTIME.  30 tablet  1  . Tapentadol HCl (NUCYNTA) 100 MG TABS Take 1 tablet by mouth daily. 1 tablet six times a day.      . baclofen (LIORESAL) 20 MG tablet Take 20 mg by mouth at bedtime.        Marland Kitchen DISCONTD: albuterol (PROVENTIL) 90 MCG/ACT inhaler Inhale 2 puffs into the lungs every 6 (six) hours as needed for wheezing.  17 g  12    BP 120/86  Pulse 106  Temp 98.1 F (36.7 C) (Oral)   Resp 16  Wt 242 lb (109.77 kg)  SpO2 98%       Objective:   Physical Exam  Constitutional: He appears well-developed and well-nourished. No distress.  HENT:  Head: Normocephalic and atraumatic.  Right Ear: Tympanic membrane and ear canal normal.  Left Ear: Tympanic membrane and ear canal normal.  Mouth/Throat: Posterior oropharyngeal erythema present. No oropharyngeal exudate or posterior oropharyngeal edema.       + frontal and maxillary sinus tenderness to palpation. + voice hoarseness.   Cardiovascular: Normal rate and regular rhythm.   No murmur heard. Pulmonary/Chest: Effort normal and breath sounds normal. No respiratory distress. He has no wheezes. He has no rales. He exhibits no tenderness.  Lymphadenopathy:    He has no cervical adenopathy.  Psychiatric: He has a normal mood and affect. His behavior is normal. Judgment and thought content normal.          Assessment & Plan:

## 2012-07-20 NOTE — Assessment & Plan Note (Signed)
Will rx with ceftin.  Pt is instructed to call if symptoms worsen or if no improvement in 2-3 days.

## 2012-08-10 ENCOUNTER — Other Ambulatory Visit: Payer: Self-pay | Admitting: Family

## 2012-08-11 ENCOUNTER — Telehealth: Payer: Self-pay | Admitting: Family

## 2012-08-11 MED ORDER — OMEGA-3-ACID ETHYL ESTERS 1 G PO CAPS: ORAL_CAPSULE | ORAL | Status: DC

## 2012-08-11 NOTE — Telephone Encounter (Signed)
Refill sent to pharmacy.   

## 2012-08-11 NOTE — Telephone Encounter (Signed)
Refill-lovaza 1gm capsule. Take 4 capsules by mouth daily. Qty 120 last fill 9.1.13

## 2012-08-11 NOTE — Telephone Encounter (Signed)
Spoke to pt and verified that he is currently taking novolog 28-34 units three times daily with meals.  Lantus 44 units at bedtime. Doses corrected and refills sent to pharmacy.

## 2012-08-13 ENCOUNTER — Ambulatory Visit (INDEPENDENT_AMBULATORY_CARE_PROVIDER_SITE_OTHER): Payer: No Typology Code available for payment source | Admitting: Family

## 2012-08-13 ENCOUNTER — Encounter: Payer: Self-pay | Admitting: Family

## 2012-08-13 VITALS — BP 110/70 | HR 104 | Temp 98.1°F | Resp 16 | Ht 71.0 in | Wt 232.1 lb

## 2012-08-13 DIAGNOSIS — E118 Type 2 diabetes mellitus with unspecified complications: Secondary | ICD-10-CM

## 2012-08-13 DIAGNOSIS — Z23 Encounter for immunization: Secondary | ICD-10-CM

## 2012-08-13 DIAGNOSIS — F17200 Nicotine dependence, unspecified, uncomplicated: Secondary | ICD-10-CM

## 2012-08-13 DIAGNOSIS — E119 Type 2 diabetes mellitus without complications: Secondary | ICD-10-CM

## 2012-08-13 LAB — BASIC METABOLIC PANEL
Chloride: 100 mEq/L (ref 96–112)
Potassium: 4.3 mEq/L (ref 3.5–5.3)
Sodium: 137 mEq/L (ref 135–145)

## 2012-08-13 LAB — HEMOGLOBIN A1C: Hgb A1c MFr Bld: 9.8 % — ABNORMAL HIGH (ref <5.7)

## 2012-08-13 NOTE — Patient Instructions (Addendum)
Please complete your blood work prior to leaving.  Follow up in 3 months. 

## 2012-08-13 NOTE — Progress Notes (Signed)
Subjective:    Patient ID: [name], male    DOB: 13-Aug-1976, 36 y.o.   MRN: [id number]  HPI  Mr.  [name] is a 36 yr old male who presents today for follow up.  Diabetes-  Reports that his sugars at home 130 fasting.  After eating, 150 post prandial.  Continues  Lanus 44 units daily. Novolog-  Sometimes not taking before breakfast, lunch.  Had hypoglycemic event last Saturday.  Review of Systems see HPI    Past Medical History  Diagnosis Date  . Allergy   . Asthma   . Depression   . GERD (gastroesophageal reflux disease)   . Hypertension   . Diabetes mellitus     Type II  . Hyperlipidemia   . Chronic pain disorder     Sees [facility]  . Urinary incontinence     detrusor instability  . Hypogonadism     History   Social History  . Marital Status: Married    Spouse Name: N/A    Number of Children: 2  . Years of Education: N/A   Occupational History  . Disabled     chronic pain   Social History Main Topics  . Smoking status: Current Every Day Smoker -- 0.3 packs/day    Types: Cigarettes  . Smokeless tobacco: Never Used  . Alcohol Use: No  . Drug Use: No     stopped cocaine--2003. Normal drug screen March 2008  . Sexually Active: Not on file   Other Topics Concern  . Not on file   Social History Narrative  . No narrative on file    Past Surgical History  Procedure Date  . Appendectomy   . Hernia repair   . Nasal sinus surgery 2008    Family History  Problem Relation Age of Onset  . Diabetes Mother   . Hypertension Mother   . Cirrhosis Mother   . Arthritis Father 49    osteoarthritis  . Diabetes Sister     borderline  . Heart disease Paternal Uncle 11    MI  . Heart disease Maternal Grandfather     late 60's--MI  . Cancer Paternal Grandmother 10    lung  . Heart disease Paternal Grandfather 74    MI    Allergies  Allergen Reactions  . Ciprofloxacin     REACTION: Rash and itching  . Hydrocodone-Acetaminophen    REACTION: Rash and itching- PT STATES NOT ALLERGIC    Current Outpatient Prescriptions on File Prior to Visit  Medication Sig Dispense Refill  . azelastine (ASTELIN) 137 MCG/SPRAY nasal spray Place 2 sprays into the nose every morning. Use in each nostril as directed      . cetirizine (ZYRTEC) 10 MG tablet Take 10 mg by mouth daily.        . chlorzoxazone (PARAFON) 500 MG tablet Take 500 mg by mouth 3 (three) times daily as needed.      . clonazePAM (KLONOPIN) 2 MG tablet Take 2 mg by mouth 2 (two) times daily as needed.        . CYMBALTA 60 MG capsule TAKE ONE CAPSULE BY MOUTH EVERY DAY  30 capsule  1  . [employer] (AUTO-LANCET) MISC Check blood sugar 4 times daily.       . metFORMIN (GLUCOPHAGE) 1000 MG tablet Take 1,000 mg by mouth 2 (two) times daily with meals.        . nabumetone (RELAFEN) 750 MG tablet Take 750 mg by mouth  2 (two) times daily.        Marland Kitchen omega-3 acid ethyl esters (LOVAZA) 1 G capsule Take 4 tablets by mouth daily.  120 capsule  2  . pregabalin (LYRICA) 100 MG capsule Take 100 mg by mouth 3 (three) times daily.        Marland Kitchen SIMCOR 1000-20 MG 24 hr tablet TAKE 1 TABLET BY MOUTH AT BEDTIME.  30 tablet  1  . Tapentadol HCl (NUCYNTA) 100 MG TABS Take 1 tablet by mouth daily. 1 tablet six times a day.      Marland Kitchen DISCONTD: insulin glargine (LANTUS SOLOSTAR) 100 UNIT/ML injection Inject 44 Units into the skin at bedtime.  15 Syringe  2  . DISCONTD: insulin glargine (LANTUS) 100 UNIT/ML injection Inject 44 Units into the skin at bedtime.       Marland Kitchen DISCONTD: albuterol (PROVENTIL) 90 MCG/ACT inhaler Inhale 2 puffs into the lungs every 6 (six) hours as needed for wheezing.  17 g  12    BP 110/70  Pulse 104  Temp 98.1 F (36.7 C) (Oral)  Resp 16  Ht 5\' 11"  (1.803 m)  Wt 232 lb 1.3 oz (105.271 kg)  BMI 32.37 kg/m2  SpO2 97%    Objective:   Physical Exam  Constitutional: He appears well-developed and well-nourished. No distress.  Cardiovascular: Normal rate and regular rhythm.     No murmur heard. Pulmonary/Chest: Effort normal and breath sounds normal. No respiratory distress. He has no wheezes. He has no rales. He exhibits no tenderness.  Musculoskeletal: He exhibits no edema.  Skin: Skin is warm and dry.  Psychiatric: He has a normal mood and affect. His behavior is normal. Judgment and thought content normal.          Assessment & Plan:

## 2012-08-15 ENCOUNTER — Telehealth: Payer: Self-pay | Admitting: Family

## 2012-08-15 DIAGNOSIS — E1165 Type 2 diabetes mellitus with hyperglycemia: Secondary | ICD-10-CM

## 2012-08-15 DIAGNOSIS — E291 Testicular hypofunction: Secondary | ICD-10-CM

## 2012-08-15 DIAGNOSIS — IMO0002 Reserved for concepts with insufficient information to code with codable children: Secondary | ICD-10-CM

## 2012-08-15 NOTE — Telephone Encounter (Signed)
Pls call pt and let him know that his A1C is 9.8.  DM not well controlled.  I would like him to increase lantus from 44 units to 49 units. Also, I would like to refer him to Endocrinology to help with his diabetes.

## 2012-08-15 NOTE — Assessment & Plan Note (Signed)
He is interested in quitting. We discussed various options. Would avoid chantix due to depression and zyban due to potential drug interactions. Recommended Nicotine patch.

## 2012-08-15 NOTE — Assessment & Plan Note (Signed)
Deteriorated.  See phone note.  Will increase lantus from 44 units to 49 units and refer to Endocrinology.

## 2012-08-16 NOTE — Telephone Encounter (Signed)
Notified pt and he voices understanding, he is agreeable to proceed with endo referral.

## 2012-08-16 NOTE — Telephone Encounter (Signed)
Left message on home/cell# to return my call. 

## 2012-08-25 ENCOUNTER — Ambulatory Visit: Payer: No Typology Code available for payment source | Admitting: Endocrinology

## 2012-08-25 DIAGNOSIS — Z0289 Encounter for other administrative examinations: Secondary | ICD-10-CM

## 2012-09-08 ENCOUNTER — Other Ambulatory Visit: Payer: Self-pay | Admitting: Family

## 2012-09-10 ENCOUNTER — Telehealth: Payer: Self-pay | Admitting: Family

## 2012-09-10 NOTE — Telephone Encounter (Signed)
Refill-lovaza 1gm capsule. Take 4 capsules by mouth daily. Qty 120 last fill 9.1.13 °

## 2012-09-10 NOTE — Telephone Encounter (Signed)
Refill sent on 08/11/12 #120 x 2 refills. Left message on pharmacy voicemail to use refills on file or call with any questions.

## 2012-10-08 ENCOUNTER — Other Ambulatory Visit: Payer: Self-pay | Admitting: Family

## 2012-10-08 NOTE — Telephone Encounter (Signed)
Rx to pharmacy/[facility] 

## 2012-10-25 ENCOUNTER — Telehealth: Payer: Self-pay | Admitting: *Deleted

## 2012-10-25 NOTE — Telephone Encounter (Signed)
[name] wife called stating pt saw his "hand specialist" recently and they are wanting him to have an A1c completed to see if his diabetes control may be contributing to his hand problem. Pt states he told them that his last a1c was 08/13/12 and they are still requesting him to repeat it at this time. She wanted to know if we could order this for him. Advised her to contact specialist's office and have them give pt an order to repeat test and pt may bring that order to the [place] lab in our office. Advised her that I am not sure if insurance will pay for it as it has not been exactly 3 months since previous A1c was drawn.

## 2012-10-26 NOTE — Telephone Encounter (Addendum)
A1C was 9.8- diabetes is uncontrolled.  I don't think that there is any value in repeating sooner than 3 months. Reviewed with wife.

## 2012-11-06 ENCOUNTER — Other Ambulatory Visit: Payer: Self-pay | Admitting: Family

## 2012-11-15 ENCOUNTER — Ambulatory Visit: Payer: No Typology Code available for payment source | Admitting: Family

## 2012-11-21 ENCOUNTER — Encounter (HOSPITAL_COMMUNITY): Payer: Self-pay | Admitting: *Deleted

## 2012-11-21 ENCOUNTER — Emergency Department (HOSPITAL_COMMUNITY)
Admission: EM | Admit: 2012-11-21 | Discharge: 2012-11-22 | Disposition: A | Discharge status: Home / Self Care | Payer: No Typology Code available for payment source | Attending: Emergency Medicine | Admitting: Emergency Medicine

## 2012-11-21 DIAGNOSIS — F1910 Other psychoactive substance abuse, uncomplicated: Secondary | ICD-10-CM | POA: Insufficient documentation

## 2012-11-21 DIAGNOSIS — F17200 Nicotine dependence, unspecified, uncomplicated: Secondary | ICD-10-CM | POA: Insufficient documentation

## 2012-11-21 DIAGNOSIS — G894 Chronic pain syndrome: Secondary | ICD-10-CM | POA: Insufficient documentation

## 2012-11-21 DIAGNOSIS — F329 Major depressive disorder, single episode, unspecified: Secondary | ICD-10-CM | POA: Insufficient documentation

## 2012-11-21 DIAGNOSIS — E291 Testicular hypofunction: Secondary | ICD-10-CM | POA: Insufficient documentation

## 2012-11-21 DIAGNOSIS — K219 Gastro-esophageal reflux disease without esophagitis: Secondary | ICD-10-CM | POA: Insufficient documentation

## 2012-11-21 DIAGNOSIS — J45909 Unspecified asthma, uncomplicated: Secondary | ICD-10-CM | POA: Insufficient documentation

## 2012-11-21 DIAGNOSIS — E119 Type 2 diabetes mellitus without complications: Secondary | ICD-10-CM | POA: Insufficient documentation

## 2012-11-21 DIAGNOSIS — N318 Other neuromuscular dysfunction of bladder: Secondary | ICD-10-CM | POA: Insufficient documentation

## 2012-11-21 DIAGNOSIS — F3289 Other specified depressive episodes: Secondary | ICD-10-CM | POA: Insufficient documentation

## 2012-11-21 DIAGNOSIS — I10 Essential (primary) hypertension: Secondary | ICD-10-CM | POA: Insufficient documentation

## 2012-11-21 DIAGNOSIS — Z79899 Other long term (current) drug therapy: Secondary | ICD-10-CM | POA: Insufficient documentation

## 2012-11-21 DIAGNOSIS — E785 Hyperlipidemia, unspecified: Secondary | ICD-10-CM | POA: Insufficient documentation

## 2012-11-21 DIAGNOSIS — Z794 Long term (current) use of insulin: Secondary | ICD-10-CM | POA: Insufficient documentation

## 2012-11-21 LAB — CBC
HCT: 39.7 % (ref 39.0–52.0)
Hemoglobin: 13.6 g/dL (ref 13.0–17.0)
MCH: 30.6 pg (ref 26.0–34.0)
MCV: 89.4 fL (ref 78.0–100.0)
RBC: 4.44 MIL/uL (ref 4.22–5.81)
WBC: 8.6 10*3/uL (ref 4.0–10.5)

## 2012-11-21 LAB — ACETAMINOPHEN LEVEL: Acetaminophen (Tylenol), Serum: <15 ug/mL (ref 10–30)

## 2012-11-21 LAB — RAPID URINE DRUG SCREEN, HOSP PERFORMED
Opiates: POSITIVE — AB
Tetrahydrocannabinol: NOT DETECTED

## 2012-11-21 LAB — COMPREHENSIVE METABOLIC PANEL
AST: 43 U/L — ABNORMAL HIGH (ref 0–37)
BUN: 8 mg/dL (ref 6–23)
CO2: 26 mEq/L (ref 19–32)
Calcium: 9.3 mg/dL (ref 8.4–10.5)
Chloride: 95 mEq/L — ABNORMAL LOW (ref 96–112)
Creatinine, Ser: 0.82 mg/dL (ref 0.50–1.35)
GFR calc Af Amer: >90 mL/min (ref >90)
GFR calc non Af Amer: >90 mL/min (ref >90)
Glucose, Bld: 225 mg/dL — ABNORMAL HIGH (ref 70–99)
Total Bilirubin: 0.2 mg/dL — ABNORMAL LOW (ref 0.3–1.2)

## 2012-11-21 MED ORDER — SIMVASTATIN 20 MG PO TABS: 20.0000 mg | ORAL_TABLET | Freq: Every day | ORAL | Status: DC

## 2012-11-21 MED ORDER — DULOXETINE HCL 20 MG PO CPEP: 20.0000 mg | ORAL_CAPSULE | Freq: Every day | ORAL | Status: DC

## 2012-11-21 MED ORDER — LORATADINE 10 MG PO TABS: 10.0000 mg | ORAL_TABLET | Freq: Every day | ORAL | Status: DC

## 2012-11-21 MED ORDER — PREGABALIN 100 MG PO CAPS
100.0000 mg | ORAL_CAPSULE | Freq: Three times a day (TID) | ORAL | Status: DC
  Administered 2012-11-21: 100 mg via ORAL
  Filled 2012-11-21: qty 1

## 2012-11-21 MED ORDER — DULOXETINE HCL 60 MG PO CPEP: 60.0000 mg | ORAL_CAPSULE | Freq: Every day | ORAL | Status: DC

## 2012-11-21 MED ORDER — NIACIN-SIMVASTATIN ER 1000-20 MG PO TB24: 1.0000 | ORAL_TABLET | Freq: Every day | ORAL | Status: DC

## 2012-11-21 MED ORDER — TADALAFIL 5 MG PO TABS: 5.0000 mg | ORAL_TABLET | Freq: Every day | ORAL | Status: DC

## 2012-11-21 MED ORDER — AUTO-LANCET MISC: 4.0000 [IU] | Freq: Three times a day (TID) | Status: DC

## 2012-11-21 MED ORDER — ALBUTEROL SULFATE HFA 108 (90 BASE) MCG/ACT IN AERS: 2.0000 | INHALATION_SPRAY | RESPIRATORY_TRACT | Status: DC | PRN

## 2012-11-21 MED ORDER — NABUMETONE 750 MG PO TABS
750.0000 mg | ORAL_TABLET | Freq: Two times a day (BID) | ORAL | Status: DC
  Administered 2012-11-21: 750 mg via ORAL
  Filled 2012-11-21: qty 1

## 2012-11-21 MED ORDER — OXYCODONE HCL 5 MG PO TABS
10.0000 mg | ORAL_TABLET | ORAL | Status: DC | PRN
  Administered 2012-11-21: 10 mg via ORAL
  Filled 2012-11-21: qty 2

## 2012-11-21 MED ORDER — AZELASTINE HCL 0.1 % NA SOLN
1.0000 | Freq: Every day | NASAL | Status: DC
  Filled 2012-11-21: qty 30

## 2012-11-21 MED ORDER — INSULIN GLARGINE 100 UNIT/ML ~~LOC~~ SOLN
44.0000 [IU] | Freq: Every day | SUBCUTANEOUS | Status: DC
  Administered 2012-11-21: 44 [IU] via SUBCUTANEOUS
  Filled 2012-11-21: qty 1

## 2012-11-21 MED ORDER — NIACIN ER 500 MG PO TBCR: 1000.0000 mg | EXTENDED_RELEASE_TABLET | Freq: Every day | ORAL | Status: DC

## 2012-11-21 MED ORDER — DULOXETINE HCL 60 MG PO CPEP: 60.0000 mg | ORAL_CAPSULE | Freq: Every morning | ORAL | Status: DC

## 2012-11-21 MED ORDER — CHLORZOXAZONE 500 MG PO TABS
500.0000 mg | ORAL_TABLET | Freq: Three times a day (TID) | ORAL | Status: DC | PRN
  Filled 2012-11-21: qty 1

## 2012-11-21 MED ORDER — INSULIN ASPART 100 UNIT/ML ~~LOC~~ SOLN: 28.0000 [IU] | Freq: Three times a day (TID) | SUBCUTANEOUS | Status: DC

## 2012-11-21 MED ORDER — METFORMIN HCL 500 MG PO TABS
1000.0000 mg | ORAL_TABLET | Freq: Two times a day (BID) | ORAL | Status: DC
  Filled 2012-11-21: qty 2

## 2012-11-21 MED ORDER — DULOXETINE HCL 20 MG PO CPEP: 20.0000 mg | ORAL_CAPSULE | Freq: Every morning | ORAL | Status: DC

## 2012-11-21 MED ORDER — CLONAZEPAM 0.5 MG PO TABS
2.0000 mg | ORAL_TABLET | Freq: Two times a day (BID) | ORAL | Status: DC
  Administered 2012-11-21: 2 mg via ORAL
  Filled 2012-11-21: qty 4

## 2012-11-21 MED ORDER — OMEGA-3-ACID ETHYL ESTERS 1 G PO CAPS
4.0000 g | ORAL_CAPSULE | Freq: Every day | ORAL | Status: DC
  Administered 2012-11-21: 4 g via ORAL
  Filled 2012-11-21: qty 4

## 2012-11-21 NOTE — ED Notes (Signed)
Pt wanded by security; changed into scrubs 

## 2012-11-21 NOTE — ED Notes (Signed)
Pt states he is tired of having chronic pain; states has a lot of anxiety and goes from one extreme to the other; states has been worse the past month; has been in the bed for most of the past month; has episodes of crying to exploding with anger; feels suicidal with a plan of overdose because can't continue feeling like this; states feels like meds aren't working; feels like he blacks out at times; wife at side; pt calm at present

## 2012-11-21 NOTE — ED Notes (Signed)
One patient belonging bag and pair of flip flops placed in locker 36 per triage tech.

## 2012-11-21 NOTE — ED Provider Notes (Signed)
History   This chart was scribed for non-physician practitioner working with Remi Haggard, NP by Magnus Sinning, ED Scribe. This patient was seen in room [facility] and the patient's care was started at 21:25.    CSN: [id number]  Arrival date & time 11/21/12  2027  Chief Complaint  Patient presents with  . Medical Clearance    (Consider location/radiation/quality/duration/timing/severity/associated sxs/prior treatment) HPI Comments: [name] is a 37 y.o. male who presents to the Emergency Department for medical clearance due to persistent SI ideation with associated visual hallucinations of shadows and bubbles and, per wife, the pt states he has auditory hallucinations telling him to take "all of medications." He has current c/o of chronic constant severe pain that he treats with 100 mg nucynta every four hours.  The patient has additional c/o of anxiety , and anger, along with noted suicidal ideation, which he states his a plan to take all his medication that he normally takes for chronic pain all over body.  He says his depression and anxiety medication are not working and he explains that he has had "roller coaster" of emotions, explaining he will be calm and then immediately become angry and anxious. However, he denies homicidal ideation and says he has not missed any of his medications. He says he has had chronic pain for the past 9 years, which doctors have prescribed nucynta for treatment.  The patient denies ever being seen  at [facility], but he states he was previously seen at [facility] for addiction to [facility].  The patient denies alcohol use, but says he smokes. The patient has two children at home. He says he was a patient of Dr. Vear Clock for pain injection additionally. Patient has hx of DM, which he treats with both insulin and pills.   PCP: Sandford Craze ,NP  The history is provided by the patient and the spouse. No language interpreter was  used.    Past Medical History  Diagnosis Date  . Allergy   . Asthma   . Depression   . GERD (gastroesophageal reflux disease)   . Hypertension   . Diabetes mellitus     Type II  . Hyperlipidemia   . Chronic pain disorder     Sees [facility]  . Urinary incontinence     detrusor instability  . Hypogonadism     Past Surgical History  Procedure Date  . Appendectomy   . Hernia repair   . Nasal sinus surgery 2008    Family History  Problem Relation Age of Onset  . Diabetes Mother   . Hypertension Mother   . Cirrhosis Mother   . Arthritis Father 49    osteoarthritis  . Diabetes Sister     borderline  . Heart disease Paternal Uncle 20    MI  . Heart disease Maternal Grandfather     late 60's--MI  . Cancer Paternal Grandmother 37    lung  . Heart disease Paternal Grandfather 23    MI    History  Substance Use Topics  . Smoking status: Current Every Day Smoker -- 0.3 packs/day    Types: Cigarettes  . Smokeless tobacco: Never Used  . Alcohol Use: No      Review of Systems  Psychiatric/Behavioral: Positive for suicidal ideas and hallucinations.  All other systems reviewed and are negative.    Allergies  Ciprofloxacin and Hydrocodone-acetaminophen  Home Medications   Current Outpatient Rx  Name  Route  Sig  Dispense  Refill  . AZELASTINE HCL 137 MCG/SPRAY NA SOLN   Nasal   Place 2 sprays into the nose every morning. Use in each nostril as directed         . CETIRIZINE HCL 10 MG PO TABS   Oral   Take 10 mg by mouth daily.           . CHLORZOXAZONE 500 MG PO TABS   Oral   Take 500 mg by mouth 3 (three) times daily as needed.         Marland Kitchen CLONAZEPAM 2 MG PO TABS   Oral   Take 2 mg by mouth 2 (two) times daily as needed.           . CYMBALTA 60 MG PO CPEP      TAKE ONE CAPSULE BY MOUTH EVERY DAY   30 capsule   2   . INSULIN GLARGINE 100 UNIT/ML [place] SOLN   Subcutaneous   Inject 49 Units into the skin at bedtime.           . AUTO-LANCET MISC      Check blood sugar 4 times daily.          Marland Kitchen LOVAZA 1 G PO CAPS      TAKE 4 CAPSULES BY MOUTH DAILY   120 capsule   2   . METFORMIN HCL 1000 MG PO TABS   Oral   Take 1,000 mg by mouth 2 (two) times daily with meals.           Marland Kitchen NABUMETONE 750 MG PO TABS   Oral   Take 750 mg by mouth 2 (two) times daily.           Marland Kitchen PREGABALIN 100 MG PO CAPS   Oral   Take 100 mg by mouth 3 (three) times daily.           Marland Kitchen PROAIR HFA 108 (90 BASE) MCG/ACT IN AERS      INHALE 2 PUFFS INTO THE LUNGS EVERY 6 (SIX) HOURS AS NEEDED FOR WHEEZING.   1 Inhaler   11   . SIMCOR 1000-20 MG PO TB24      TAKE 1 TABLET BY MOUTH AT BEDTIME.   30 tablet   2   . TAPENTADOL HCL 100 MG PO TABS   Oral   Take 1 tablet by mouth daily. 1 tablet six times a day.           BP 112/76  Pulse 85  Temp 97.6 F (36.4 C) (Oral)  Resp 20  SpO2 100%  Physical Exam  Nursing note and vitals reviewed. Constitutional: He is oriented to person, place, and time. He appears well-developed and well-nourished. No distress.  HENT:  Head: Normocephalic and atraumatic.  Eyes: Conjunctivae normal and EOM are normal.  Neck: Neck supple. No tracheal deviation present.  Cardiovascular: Normal rate, regular rhythm and normal heart sounds.   Pulmonary/Chest: Effort normal. No respiratory distress. He has no wheezes. He has no rales.  Abdominal: He exhibits no distension.  Musculoskeletal: Normal range of motion.  Neurological: He is alert and oriented to person, place, and time. No sensory deficit.  Skin: Skin is warm and dry.  Psychiatric: He has a normal mood and affect. His behavior is normal. He expresses suicidal ideation. He expresses no homicidal ideation. He expresses suicidal plans.    ED Course  Procedures (including critical care time) DIAGNOSTIC STUDIES: Oxygen Saturation is 100% on room air, normal by my interpretation.  COORDINATION OF CARE:   Labs Reviewed   COMPREHENSIVE METABOLIC PANEL - Abnormal; Notable for the following:    Sodium 131 (*)     Chloride 95 (*)     Glucose, Bld 225 (*)     AST 43 (*)     ALT 66 (*)     Total Bilirubin 0.2 (*)     All other components within normal limits  URINE RAPID DRUG SCREEN (HOSP PERFORMED) - Abnormal; Notable for the following:    Opiates POSITIVE (*)     Cocaine POSITIVE (*)     Benzodiazepines POSITIVE (*)     All other components within normal limits  SALICYLATE LEVEL - Abnormal; Notable for the following:    Salicylate Lvl <2.0 (*)     All other components within normal limits  GLUCOSE, CAPILLARY - Abnormal; Notable for the following:    Glucose-Capillary 186 (*)     All other components within normal limits  CBC  ETHANOL  ACETAMINOPHEN LEVEL   No results found.   No diagnosis found.    MDM  37yo male with SI ideations with hallucinations and hearing voices telling him to take all of his pills at once.  pmh of chronic pain, anxiety, "emotional roller coaster".  Holding orders in.  Daily meds in.  Will get telepsyche. Medically cleared.      Labs Reviewed  COMPREHENSIVE METABOLIC PANEL - Abnormal; Notable for the following:    Sodium 131 (*)     Chloride 95 (*)     Glucose, Bld 225 (*)     AST 43 (*)     ALT 66 (*)     Total Bilirubin 0.2 (*)     All other components within normal limits  URINE RAPID DRUG SCREEN (HOSP PERFORMED) - Abnormal; Notable for the following:    Opiates POSITIVE (*)     Cocaine POSITIVE (*)     Benzodiazepines POSITIVE (*)     All other components within normal limits  SALICYLATE LEVEL - Abnormal; Notable for the following:    Salicylate Lvl <2.0 (*)     All other components within normal limits  GLUCOSE, CAPILLARY - Abnormal; Notable for the following:    Glucose-Capillary 186 (*)     All other components within normal limits  CBC  ETHANOL  ACETAMINOPHEN LEVEL     I personally performed the services described in this documentation, which  was scribed in my presence. The recorded information has been reviewed and is accurate.   Dr. Holli Humbles determined that the patient is a drug seeker and recommends that he be discharged.  He will be discharged home.      Remi Haggard, NP 11/22/12 [phone]

## 2012-11-21 NOTE — ED Notes (Signed)
Pt transferred from triage, presents for medical clearance.  Pt depressed & SI with plan to OD on pills.  Feeling hopeless, states he is tired of being in pain and feels like his pills aren't working. C/O AV hallucinations hearing voices telling him to take pills, seeing shadows & bubbles, bugs crawling on wall.  Pt cooperative & anxious at present.

## 2012-11-22 ENCOUNTER — Telehealth: Payer: Self-pay | Admitting: Family

## 2012-11-22 ENCOUNTER — Encounter: Payer: Self-pay | Admitting: Family

## 2012-11-22 ENCOUNTER — Encounter (HOSPITAL_BASED_OUTPATIENT_CLINIC_OR_DEPARTMENT_OTHER): Payer: Self-pay | Admitting: *Deleted

## 2012-11-22 ENCOUNTER — Ambulatory Visit: Payer: No Typology Code available for payment source | Admitting: Family

## 2012-11-22 ENCOUNTER — Emergency Department (HOSPITAL_BASED_OUTPATIENT_CLINIC_OR_DEPARTMENT_OTHER)
Admission: EM | Admit: 2012-11-22 | Discharge: 2012-11-25 | Disposition: A | Discharge status: Home / Self Care | Payer: No Typology Code available for payment source | Attending: Emergency Medicine | Admitting: Emergency Medicine

## 2012-11-22 ENCOUNTER — Ambulatory Visit (INDEPENDENT_AMBULATORY_CARE_PROVIDER_SITE_OTHER): Payer: No Typology Code available for payment source | Admitting: Family

## 2012-11-22 VITALS — BP 110/80 | HR 87 | Temp 97.9°F | Resp 16 | Ht 71.0 in | Wt 245.0 lb

## 2012-11-22 DIAGNOSIS — I10 Essential (primary) hypertension: Secondary | ICD-10-CM | POA: Insufficient documentation

## 2012-11-22 DIAGNOSIS — K219 Gastro-esophageal reflux disease without esophagitis: Secondary | ICD-10-CM | POA: Insufficient documentation

## 2012-11-22 DIAGNOSIS — E785 Hyperlipidemia, unspecified: Secondary | ICD-10-CM | POA: Insufficient documentation

## 2012-11-22 DIAGNOSIS — F3289 Other specified depressive episodes: Secondary | ICD-10-CM | POA: Insufficient documentation

## 2012-11-22 DIAGNOSIS — J45909 Unspecified asthma, uncomplicated: Secondary | ICD-10-CM | POA: Insufficient documentation

## 2012-11-22 DIAGNOSIS — G8929 Other chronic pain: Secondary | ICD-10-CM

## 2012-11-22 DIAGNOSIS — R32 Unspecified urinary incontinence: Secondary | ICD-10-CM | POA: Insufficient documentation

## 2012-11-22 DIAGNOSIS — F32A Depression, unspecified: Secondary | ICD-10-CM

## 2012-11-22 DIAGNOSIS — E291 Testicular hypofunction: Secondary | ICD-10-CM | POA: Insufficient documentation

## 2012-11-22 DIAGNOSIS — F329 Major depressive disorder, single episode, unspecified: Secondary | ICD-10-CM

## 2012-11-22 DIAGNOSIS — E119 Type 2 diabetes mellitus without complications: Secondary | ICD-10-CM | POA: Insufficient documentation

## 2012-11-22 DIAGNOSIS — R456 Violent behavior: Secondary | ICD-10-CM

## 2012-11-22 DIAGNOSIS — G894 Chronic pain syndrome: Secondary | ICD-10-CM | POA: Insufficient documentation

## 2012-11-22 LAB — GLUCOSE, CAPILLARY: Glucose-Capillary: 126 mg/dL — ABNORMAL HIGH (ref 70–99)

## 2012-11-22 MED ORDER — AZELASTINE HCL 0.1 % NA SOLN
2.0000 | Freq: Every day | NASAL | Status: DC
  Administered 2012-11-23 – 2012-11-24 (×2): 2 via NASAL
  Filled 2012-11-22 (×2): qty 30

## 2012-11-22 MED ORDER — INSULIN GLARGINE 100 UNIT/ML ~~LOC~~ SOLN
44.0000 [IU] | Freq: Every day | SUBCUTANEOUS | Status: DC
  Administered 2012-11-22 – 2012-11-24 (×3): 44 [IU] via SUBCUTANEOUS
  Filled 2012-11-22 (×4): qty 1

## 2012-11-22 MED ORDER — SIMVASTATIN 20 MG PO TABS
20.0000 mg | ORAL_TABLET | Freq: Every day | ORAL | Status: DC
  Administered 2012-11-23 – 2012-11-24 (×2): 20 mg via ORAL
  Filled 2012-11-22 (×3): qty 1

## 2012-11-22 MED ORDER — LORATADINE 10 MG PO TABS
10.0000 mg | ORAL_TABLET | Freq: Every day | ORAL | Status: DC
  Administered 2012-11-23 – 2012-11-25 (×3): 10 mg via ORAL
  Filled 2012-11-22 (×4): qty 1

## 2012-11-22 MED ORDER — PREGABALIN 100 MG PO CAPS
100.0000 mg | ORAL_CAPSULE | Freq: Three times a day (TID) | ORAL | Status: DC
  Administered 2012-11-22 – 2012-11-25 (×8): 100 mg via ORAL
  Filled 2012-11-22 (×8): qty 1

## 2012-11-22 MED ORDER — ONDANSETRON HCL 4 MG PO TABS: 4.0000 mg | ORAL_TABLET | Freq: Three times a day (TID) | ORAL | Status: DC | PRN

## 2012-11-22 MED ORDER — TAPENTADOL HCL 50 MG PO TABS
100.0000 mg | ORAL_TABLET | ORAL | Status: DC
  Administered 2012-11-22 – 2012-11-25 (×16): 100 mg via ORAL
  Filled 2012-11-22 (×16): qty 2

## 2012-11-22 MED ORDER — ALUM & MAG HYDROXIDE-SIMETH 200-200-20 MG/5ML PO SUSP: 30.0000 mL | ORAL | Status: DC | PRN

## 2012-11-22 MED ORDER — ZOLPIDEM TARTRATE 5 MG PO TABS
5.0000 mg | ORAL_TABLET | Freq: Every evening | ORAL | Status: DC | PRN
  Administered 2012-11-22: 5 mg via ORAL
  Filled 2012-11-22: qty 1

## 2012-11-22 MED ORDER — NICOTINE 21 MG/24HR TD PT24
21.0000 mg | MEDICATED_PATCH | Freq: Every day | TRANSDERMAL | Status: DC
  Administered 2012-11-22: 21 mg via TRANSDERMAL
  Filled 2012-11-22: qty 1

## 2012-11-22 MED ORDER — LORAZEPAM 1 MG PO TABS
1.0000 mg | ORAL_TABLET | Freq: Three times a day (TID) | ORAL | Status: DC | PRN
  Administered 2012-11-24 (×3): 1 mg via ORAL
  Filled 2012-11-22 (×3): qty 1

## 2012-11-22 MED ORDER — INSULIN ASPART 100 UNIT/ML ~~LOC~~ SOLN
28.0000 [IU] | Freq: Three times a day (TID) | SUBCUTANEOUS | Status: DC
  Filled 2012-11-22: qty 1

## 2012-11-22 MED ORDER — NABUMETONE 750 MG PO TABS
750.0000 mg | ORAL_TABLET | Freq: Two times a day (BID) | ORAL | Status: DC
  Administered 2012-11-22 – 2012-11-25 (×6): 750 mg via ORAL
  Filled 2012-11-22 (×7): qty 1

## 2012-11-22 MED ORDER — IBUPROFEN 600 MG PO TABS
600.0000 mg | ORAL_TABLET | Freq: Three times a day (TID) | ORAL | Status: DC | PRN
  Administered 2012-11-22 – 2012-11-25 (×6): 600 mg via ORAL
  Filled 2012-11-22 (×7): qty 1

## 2012-11-22 MED ORDER — INSULIN ASPART 100 UNIT/ML ~~LOC~~ SOLN
0.0000 [IU] | Freq: Three times a day (TID) | SUBCUTANEOUS | Status: DC
  Administered 2012-11-23 – 2012-11-24 (×2): 2 [IU] via SUBCUTANEOUS
  Administered 2012-11-24: 3 [IU] via SUBCUTANEOUS
  Filled 2012-11-22 (×4): qty 1

## 2012-11-22 MED ORDER — ZIPRASIDONE MESYLATE 20 MG IM SOLR
INTRAMUSCULAR | Status: AC
  Administered 2012-11-22: 20 mg via INTRAMUSCULAR
  Filled 2012-11-22: qty 20

## 2012-11-22 MED ORDER — CLONAZEPAM 1 MG PO TABS
2.0000 mg | ORAL_TABLET | Freq: Two times a day (BID) | ORAL | Status: DC
  Administered 2012-11-22 – 2012-11-25 (×6): 2 mg via ORAL
  Filled 2012-11-22 (×6): qty 2

## 2012-11-22 MED ORDER — NIACIN ER 500 MG PO CPCR
1000.0000 mg | ORAL_CAPSULE | Freq: Every day | ORAL | Status: DC
  Administered 2012-11-23 – 2012-11-24 (×2): 1000 mg via ORAL
  Filled 2012-11-22 (×3): qty 2

## 2012-11-22 MED ORDER — ALBUTEROL SULFATE HFA 108 (90 BASE) MCG/ACT IN AERS: 2.0000 | INHALATION_SPRAY | RESPIRATORY_TRACT | Status: DC | PRN

## 2012-11-22 MED ORDER — ZIPRASIDONE MESYLATE 20 MG IM SOLR
20.0000 mg | Freq: Once | INTRAMUSCULAR | Status: AC
Start: 2012-11-22 — End: 2012-11-22
  Administered 2012-11-22: 20 mg via INTRAMUSCULAR

## 2012-11-22 MED ORDER — OMEGA-3-ACID ETHYL ESTERS 1 G PO CAPS
4.0000 g | ORAL_CAPSULE | Freq: Every day | ORAL | Status: DC
  Administered 2012-11-23 – 2012-11-24 (×2): 4 g via ORAL
  Filled 2012-11-22 (×3): qty 4

## 2012-11-22 MED ORDER — ACETAMINOPHEN 325 MG PO TABS
650.0000 mg | ORAL_TABLET | ORAL | Status: DC | PRN
  Administered 2012-11-23 – 2012-11-25 (×5): 650 mg via ORAL
  Filled 2012-11-22 (×5): qty 2

## 2012-11-22 MED ORDER — DULOXETINE HCL 60 MG PO CPEP
60.0000 mg | ORAL_CAPSULE | Freq: Every morning | ORAL | Status: DC
  Administered 2012-11-23 – 2012-11-25 (×3): 60 mg via ORAL
  Filled 2012-11-22 (×3): qty 1

## 2012-11-22 MED ORDER — METFORMIN HCL 500 MG PO TABS
1000.0000 mg | ORAL_TABLET | Freq: Two times a day (BID) | ORAL | Status: DC
  Administered 2012-11-23 – 2012-11-25 (×5): 1000 mg via ORAL
  Filled 2012-11-22 (×7): qty 2

## 2012-11-22 MED ORDER — NIACIN-SIMVASTATIN ER 1000-20 MG PO TB24
1.0000 | ORAL_TABLET | Freq: Every day | ORAL | Status: DC
Start: 2012-11-22 — End: 2012-11-22

## 2012-11-22 MED ORDER — NICOTINE 21 MG/24HR TD PT24
21.0000 mg | MEDICATED_PATCH | Freq: Every day | TRANSDERMAL | Status: DC
  Administered 2012-11-22 – 2012-11-25 (×4): 21 mg via TRANSDERMAL
  Filled 2012-11-22 (×4): qty 1

## 2012-11-22 NOTE — ED Notes (Signed)
Pt cont acting out, screaming, grunting and attempting to hit walls. [place] police remain at bedside.

## 2012-11-22 NOTE — ED Notes (Signed)
Patient given a sprite and a diet coke

## 2012-11-22 NOTE — ED Notes (Signed)
[employer] crisis called. Will call back with eta.

## 2012-11-22 NOTE — Patient Instructions (Addendum)
Please go down directly to the Emergency Department.

## 2012-11-22 NOTE — ED Provider Notes (Signed)
History     CSN: [id number]  Arrival date & time 11/22/12  1404   First MD Initiated Contact with Patient 11/22/12 1418      Chief Complaint  Patient presents with  . Psychiatric Evaluation    (Consider location/radiation/quality/duration/timing/severity/associated sxs/prior treatment) The history is provided by the patient.   37 year old male comes in because of worsening depression. He has had problems with worsening depression for the last month. During this same time period, has had problems with pain control. He has chronic muscle pain. He has had constitutional symptoms of depression including early morning awakening, crying spells, anhedonia. He has had suicidal ideation today and and his plan was drug overdose. He saw his PCP who referred him to the ED. He denies any hallucinations. He admits to using cocaine 3 days ago but denies other drug use. He does smoke half pack of cigarettes a day. He denies ethanol use.  Past Medical History  Diagnosis Date  . Allergy   . Asthma   . Depression   . GERD (gastroesophageal reflux disease)   . Hypertension   . Hyperlipidemia   . Chronic pain disorder     Sees [facility]  . Urinary incontinence     detrusor instability  . Hypogonadism   . Diabetes mellitus     Type II    Past Surgical History  Procedure Date  . Appendectomy   . Hernia repair   . Nasal sinus surgery 2008    Family History  Problem Relation Age of Onset  . Diabetes Mother   . Hypertension Mother   . Cirrhosis Mother   . Arthritis Father 49    osteoarthritis  . Diabetes Sister     borderline  . Heart disease Paternal Uncle 20    MI  . Heart disease Maternal Grandfather     late 60's--MI  . Cancer Paternal Grandmother 24    lung  . Heart disease Paternal Grandfather 28    MI    History  Substance Use Topics  . Smoking status: Current Every Day Smoker -- 0.5 packs/day    Types: Cigarettes  . Smokeless tobacco: Never Used  .  Alcohol Use: No      Review of Systems  All other systems reviewed and are negative.    Allergies  Ciprofloxacin  Home Medications   Current Outpatient Rx  Name  Route  Sig  Dispense  Refill  . AZELASTINE HCL 137 MCG/SPRAY NA SOLN   Nasal   Place 2 sprays into the nose every morning. Use in each nostril as directed         . CETIRIZINE HCL 10 MG PO TABS   Oral   Take 10 mg by mouth 2 (two) times daily.          . CHLORZOXAZONE 500 MG PO TABS   Oral   Take 500 mg by mouth 3 (three) times daily as needed. For muscle pain         . CLONAZEPAM 2 MG PO TABS   Oral   Take 2 mg by mouth 2 (two) times daily.          . DULOXETINE HCL 60 MG PO CPEP   Oral   Take 60 mg by mouth every morning.         . INSULIN ASPART 100 UNIT/ML [place] SOLN   Subcutaneous   Inject 28-36 Units into the skin 3 (three) times daily before meals.         Marland Kitchen  INSULIN GLARGINE 100 UNIT/ML [place] SOLN   Subcutaneous   Inject 44 Units into the skin at bedtime.          Marland Kitchen METFORMIN HCL 1000 MG PO TABS   Oral   Take 1,000 mg by mouth 2 (two) times daily with meals.           Marland Kitchen NABUMETONE 750 MG PO TABS   Oral   Take 750 mg by mouth 2 (two) times daily.           . OMEGA-3-ACID ETHYL ESTERS 1 G PO CAPS   Oral   Take 4 g by mouth at bedtime.         Marland Kitchen PREGABALIN 100 MG PO CAPS   Oral   Take 100 mg by mouth 3 (three) times daily.           Marland Kitchen PROAIR HFA 108 (90 BASE) MCG/ACT IN AERS      INHALE 2 PUFFS INTO THE LUNGS EVERY 6 (SIX) HOURS AS NEEDED FOR WHEEZING.   1 Inhaler   11   . SIMCOR 1000-20 MG PO TB24      TAKE 1 TABLET BY MOUTH AT BEDTIME.   30 tablet   2   . TADALAFIL 5 MG PO TABS   Oral   Take 5 mg by mouth daily.         Marland Kitchen TAPENTADOL HCL 100 MG PO TABS   Oral   Take 1 tablet by mouth every 4 (four) hours. 1 tablet six times a day.         . AUTO-LANCET MISC      Check blood sugar 4 times daily.            BP 106/72  Pulse 72  Temp 97.4 F  (36.3 C) (Oral)  Resp 20  Ht 6\' 1"  (1.854 m)  Wt 245 lb (111.131 kg)  BMI 32.32 kg/m2  SpO2 95%  Physical Exam  Nursing note and vitals reviewed.  37 year old male, resting comfortably and in no acute distress. Vital signs are normal. Oxygen saturation is 95%, which is normal. Head is normocephalic and atraumatic. PERRLA, EOMI. Oropharynx is clear. Neck is nontender and supple without adenopathy or JVD. Back is nontender and there is no CVA tenderness. Lungs are clear without rales, wheezes, or rhonchi. Chest is nontender. Heart has regular rate and rhythm without murmur. Abdomen is soft, flat, nontender without masses or hepatosplenomegaly and peristalsis is normoactive. Extremities have no cyanosis or edema, full range of motion is present. Skin is warm and dry without rash. Neurologic: He is awake, alert, oriented, cranial nerves are intact, there are no motor or sensory deficits. Psychiatric: He has a very depressed affect. He makes poor eye contact and speaks in a very soft, monotone voice.  ED Course  Procedures (including critical care time)   Labs Reviewed  ETHANOL   No results found.   1. Depression   2. Violent behavior   3. Chronic pain       MDM  Maj. depression with suicidal ideation. Psychiatric consultation will be obtained and ACT Team will be consulted to assist in placement.        Dione Booze, MD 11/22/12 2350

## 2012-11-22 NOTE — ED Notes (Signed)
Attempted to call the [facility] for sitter, no answer.

## 2012-11-22 NOTE — Progress Notes (Signed)
Subjective:    Patient ID: [name], male    DOB: [birth year], 37 y.o.   MRN: [id number]  HPI  [name] is a 37 yr old male who presents today to discuss depression. He is brought here today by his wife- [name]. He was seen in ED yesterday due to depression and suicidal thoughts.  Patient was ultimately discharged home due to concern about drug seeking behavior.  He reports that upon returning home he continued to have thoughts of suicide.  He reports + compliance with cymbalta.    ED records are reviewed.  Urine drug tox tested + for opiates, benzo's and cocaine.  He is maintained on clonazepam and nucynta. He admits to one time use of cocaine on Friday night "because he thought it would help."  Reports that cocaine did not help.  Pt describes an emotional roller coaster.  One minute he is enraged another he is in tears.  Reports that he continues to have thoughts of suicide.  Specifically he hears a voice telling him to "take all my medicines and just end it."    Review of Systems See HPI    Past Medical History  Diagnosis Date  . Allergy   . Asthma   . Depression   . GERD (gastroesophageal reflux disease)   . Hypertension   . Diabetes mellitus     Type II  . Hyperlipidemia   . Chronic pain disorder     Sees [facility]  . Urinary incontinence     detrusor instability  . Hypogonadism     History   Social History  . Marital Status: Married    Spouse Name: N/A    Number of Children: 2  . Years of Education: N/A   Occupational History  . Disabled     chronic pain   Social History Main Topics  . Smoking status: Current Every Day Smoker -- 0.3 packs/day    Types: Cigarettes  . Smokeless tobacco: Never Used  . Alcohol Use: No  . Drug Use: No     Comment: stopped cocaine--2003. Normal drug screen March 2008  . Sexually Active: Not on file   Other Topics Concern  . Not on file   Social History Narrative  . No narrative on file    Past  Surgical History  Procedure Date  . Appendectomy   . Hernia repair   . Nasal sinus surgery 2008    Family History  Problem Relation Age of Onset  . Diabetes Mother   . Hypertension Mother   . Cirrhosis Mother   . Arthritis Father 49    osteoarthritis  . Diabetes Sister     borderline  . Heart disease Paternal Uncle 40    MI  . Heart disease Maternal Grandfather     late 60's--MI  . Cancer Paternal Grandmother 10    lung  . Heart disease Paternal Grandfather 88    MI    Allergies  Allergen Reactions  . Ciprofloxacin Hives    Current Outpatient Prescriptions on File Prior to Visit  Medication Sig Dispense Refill  . azelastine (ASTELIN) 137 MCG/SPRAY nasal spray Place 2 sprays into the nose every morning. Use in each nostril as directed      . cetirizine (ZYRTEC) 10 MG tablet Take 10 mg by mouth 2 (two) times daily.       . chlorzoxazone (PARAFON) 500 MG tablet Take 500 mg by mouth 3 (three) times daily as needed. For muscle pain      .  clonazePAM (KLONOPIN) 2 MG tablet Take 2 mg by mouth 2 (two) times daily.       . DULoxetine (CYMBALTA) 60 MG capsule Take 60 mg by mouth every morning.      . insulin aspart (NOVOLOG) 100 UNIT/ML injection Inject 28-36 Units into the skin 3 (three) times daily before meals.      . insulin glargine (LANTUS) 100 UNIT/ML injection Inject 44 Units into the skin at bedtime.       [employer] (AUTO-LANCET) MISC Check blood sugar 4 times daily.       . metFORMIN (GLUCOPHAGE) 1000 MG tablet Take 1,000 mg by mouth 2 (two) times daily with meals.        . nabumetone (RELAFEN) 750 MG tablet Take 750 mg by mouth 2 (two) times daily.        Marland Kitchen omega-3 acid ethyl esters (LOVAZA) 1 G capsule Take 4 g by mouth at bedtime.      . pregabalin (LYRICA) 100 MG capsule Take 100 mg by mouth 3 (three) times daily.        Marland Kitchen PROAIR HFA 108 (90 BASE) MCG/ACT inhaler INHALE 2 PUFFS INTO THE LUNGS EVERY 6 (SIX) HOURS AS NEEDED FOR WHEEZING.  1 Inhaler  11  . SIMCOR  1000-20 MG 24 hr tablet TAKE 1 TABLET BY MOUTH AT BEDTIME.  30 tablet  2  . tadalafil (CIALIS) 5 MG tablet Take 5 mg by mouth daily.      . Tapentadol HCl (NUCYNTA) 100 MG TABS Take 1 tablet by mouth every 4 (four) hours. 1 tablet six times a day.      . [DISCONTINUED] albuterol (PROVENTIL) 90 MCG/ACT inhaler Inhale 2 puffs into the lungs every 6 (six) hours as needed for wheezing.  17 g  12    BP 110/80  Pulse 87  Temp 97.9 F (36.6 C) (Oral)  Resp 16  Ht 5\' 11"  (1.803 m)  Wt 245 lb (111.131 kg)  BMI 34.17 kg/m2  SpO2 99%    Objective:   Physical Exam  Constitutional: He appears well-developed and well-nourished.  Psychiatric: His affect is labile. He is not agitated, not aggressive and not slowed. Thought content is not delusional. Cognition and memory are impaired. He expresses impulsivity. He exhibits a depressed mood. He expresses suicidal ideation. He exhibits abnormal recent memory.          Assessment & Plan:

## 2012-11-22 NOTE — Progress Notes (Signed)
6:22 PM Pt has been seen by Mobile Crisis counselor, who has attempted inpatient psychiatric placement without success.  He has been resting peacefully since receiving Geodon 20 mg IM.  I discussed his condition with [name], M.D., ED physician at [facility] ED in charge of Psych ED.  He advised that pt should have blood ETOH repeated, but would not need his other labs repeated as they were done yesterday at [facility] ED.  Will plan to transfer pt to the Psych ED at [facility] with Dr. Preston Fleeting as the accepting physician.

## 2012-11-22 NOTE — ED Notes (Signed)
Another sandwich and a diet coke was given

## 2012-11-22 NOTE — ED Provider Notes (Addendum)
History     CSN: [id number]  Arrival date & time 11/22/12  1404   First MD Initiated Contact with Patient 11/22/12 1418      Chief Complaint  Patient presents with  . Psychiatric Evaluation    (Consider location/radiation/quality/duration/timing/severity/associated sxs/prior treatment) HPI Comments: Patient presents for evaluation for depression. He was seen yesterday at [facility] long ED for depression and had a callus site evaluation done and ultimately was discharged. He was not felt to need inpatient psychiatric treatment.  He says that his depression is worsening, feels that his med aren't working.  Is having hallucinations, hearing his own voice.  Is having suicidal hallucination, feels that he might overdose on his meds.  Has chronic pain to back, in pain clinic.  Says that he has back pain and muscle pain, unchanged from his chronic pain.     Past Medical History  Diagnosis Date  . Allergy   . Asthma   . Depression   . GERD (gastroesophageal reflux disease)   . Hypertension   . Diabetes mellitus     Type II  . Hyperlipidemia   . Chronic pain disorder     Sees [facility]  . Urinary incontinence     detrusor instability  . Hypogonadism     Past Surgical History  Procedure Date  . Appendectomy   . Hernia repair   . Nasal sinus surgery 2008    Family History  Problem Relation Age of Onset  . Diabetes Mother   . Hypertension Mother   . Cirrhosis Mother   . Arthritis Father 49    osteoarthritis  . Diabetes Sister     borderline  . Heart disease Paternal Uncle 55    MI  . Heart disease Maternal Grandfather     late 60's--MI  . Cancer Paternal Grandmother 49    lung  . Heart disease Paternal Grandfather 79    MI    History  Substance Use Topics  . Smoking status: Current Every Day Smoker -- 0.5 packs/day    Types: Cigarettes  . Smokeless tobacco: Never Used  . Alcohol Use: No      Review of Systems  Constitutional: Negative for fever,  chills, diaphoresis and fatigue.  HENT: Negative for congestion, rhinorrhea and sneezing.   Eyes: Negative.   Respiratory: Negative for cough, chest tightness and shortness of breath.   Cardiovascular: Negative for chest pain and leg swelling.  Gastrointestinal: Negative for nausea, vomiting, abdominal pain, diarrhea and blood in stool.  Genitourinary: Negative for frequency, hematuria, flank pain and difficulty urinating.  Musculoskeletal: Negative for back pain and arthralgias.  Skin: Negative for rash.  Neurological: Negative for dizziness, speech difficulty, weakness, numbness and headaches.  Psychiatric/Behavioral: Positive for suicidal ideas and hallucinations. The patient is nervous/anxious.     Allergies  Ciprofloxacin  Home Medications   Current Outpatient Rx  Name  Route  Sig  Dispense  Refill  . AZELASTINE HCL 137 MCG/SPRAY NA SOLN   Nasal   Place 2 sprays into the nose every morning. Use in each nostril as directed         . CETIRIZINE HCL 10 MG PO TABS   Oral   Take 10 mg by mouth 2 (two) times daily.          . CHLORZOXAZONE 500 MG PO TABS   Oral   Take 500 mg by mouth 3 (three) times daily as needed. For muscle pain         .  CLONAZEPAM 2 MG PO TABS   Oral   Take 2 mg by mouth 2 (two) times daily.          . DULOXETINE HCL 60 MG PO CPEP   Oral   Take 60 mg by mouth every morning.         . INSULIN ASPART 100 UNIT/ML [place] SOLN   Subcutaneous   Inject 28-36 Units into the skin 3 (three) times daily before meals.         . INSULIN GLARGINE 100 UNIT/ML [place] SOLN   Subcutaneous   Inject 44 Units into the skin at bedtime.          . AUTO-LANCET MISC      Check blood sugar 4 times daily.          Marland Kitchen METFORMIN HCL 1000 MG PO TABS   Oral   Take 1,000 mg by mouth 2 (two) times daily with meals.           Marland Kitchen NABUMETONE 750 MG PO TABS   Oral   Take 750 mg by mouth 2 (two) times daily.           . OMEGA-3-ACID ETHYL ESTERS 1 G PO CAPS    Oral   Take 4 g by mouth at bedtime.         Marland Kitchen PREGABALIN 100 MG PO CAPS   Oral   Take 100 mg by mouth 3 (three) times daily.           Marland Kitchen PROAIR HFA 108 (90 BASE) MCG/ACT IN AERS      INHALE 2 PUFFS INTO THE LUNGS EVERY 6 (SIX) HOURS AS NEEDED FOR WHEEZING.   1 Inhaler   11   . SIMCOR 1000-20 MG PO TB24      TAKE 1 TABLET BY MOUTH AT BEDTIME.   30 tablet   2   . TADALAFIL 5 MG PO TABS   Oral   Take 5 mg by mouth daily.         Marland Kitchen TAPENTADOL HCL 100 MG PO TABS   Oral   Take 1 tablet by mouth every 4 (four) hours. 1 tablet six times a day.           BP 117/83  Pulse 88  Temp 98.4 F (36.9 C) (Oral)  Resp 20  Ht 6\' 1"  (1.854 m)  Wt 245 lb (111.131 kg)  BMI 32.32 kg/m2  SpO2 98%  Physical Exam  Constitutional: He is oriented to person, place, and time. He appears well-developed and well-nourished.  HENT:  Head: Normocephalic and atraumatic.  Eyes: Pupils are equal, round, and reactive to light.  Neck: Normal range of motion. Neck supple.  Cardiovascular: Normal rate, regular rhythm and normal heart sounds.   Pulmonary/Chest: Effort normal and breath sounds normal. No respiratory distress. He has no wheezes. He has no rales. He exhibits no tenderness.  Abdominal: Soft. Bowel sounds are normal. There is no tenderness. There is no rebound and no guarding.  Musculoskeletal: Normal range of motion. He exhibits no edema.  Lymphadenopathy:    He has no cervical adenopathy.  Neurological: He is alert and oriented to person, place, and time.  Skin: Skin is warm and dry. No rash noted.  Psychiatric: He has a normal mood and affect.       tearful    ED Course  Procedures (including critical care time)  Labs Reviewed - No data to display No results found.   1. Depression  MDM  Pt with worsening depression, SI.  Will get mobile crisis evaluation.  Pt had labs yesterday  1522: pt became agitated, hitting wall, given geodon, IVC paper initiated.   Refusing to talk to mobile crisis.  Will attempt placement.  If it looks like might take a while for placement, will transfer to [facility], MD 11/22/12 1513  Rolan Bucco, MD 11/22/12 1524  Rolan Bucco, MD 11/23/12 [phone]

## 2012-11-22 NOTE — Telephone Encounter (Signed)
Reviewed ED record from yesterday.  Reports roller coaster emotions. Loses temper.  Having trouble remembering what he has done.  Reports that his depression medicine has not been working. He has not seen psychiatry. Just woke up, no SI yet today.  He has apt at 3:15 this afternoon.  Advised pt to go to ED if recurrent SI, otherwise, please keep upcoming appointment this afternoon.

## 2012-11-22 NOTE — Assessment & Plan Note (Signed)
Deteriorated.  He reports active thoughts of suicide- plan is by med overdose "to take all of his medications."  I have reviewed case with Dr. Gilmore Laroche (physician on call today at [facility] ED). Pt will be brought directly downstairs to ED by his wife.  Pt and wife are agreeable to plan. 25 minutes spent with pt today.  >50% of this time was spent counseling pt on his depression.

## 2012-11-22 NOTE — ED Provider Notes (Signed)
Medical screening examination/treatment/procedure(s) were performed by non-physician practitioner and as supervising physician I was immediately available for consultation/collaboration.    Darionna Banke L Sharie Amorin, MD 11/22/12 1126 
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
## 2012-11-22 NOTE — ED Notes (Signed)
Two bags of belongings checked and wanded in put in locker 34

## 2012-11-22 NOTE — ED Notes (Signed)
Patient been changed into scrubs and wanded by security

## 2012-11-22 NOTE — ED Notes (Signed)
[facility] with [employer] at bediside. Patient alert and oriented, skin warm and dry. No acute distress noted.

## 2012-11-22 NOTE — ED Notes (Signed)
Pt refuses to speak with mobile crisis, states "I am not going to go through this shit again!" pt is violent, hitting walls, striking at door, screaming "this is fucking bullshit! I just need medicine for my pain, I am not going to go through this shit anymore!" [place] pd phoned by charge nurse.

## 2012-11-22 NOTE — ED Notes (Signed)
[place] police in room speaking with pt, pt allows this rn to give him geodon injection.

## 2012-11-22 NOTE — ED Notes (Addendum)
Pt agitated, pacing in room, yelling and punched wall multiple times. Security called and [employer] called. Mobile crisis in dept.

## 2012-11-22 NOTE — ED Notes (Signed)
Pt now resting, [place] pd remains at bedside.

## 2012-11-22 NOTE — ED Notes (Signed)
Sent here PMD for psych eval , pt states he wants to kill himself by taking all his meds,  Wife states this has been on going x 1 month, pt states he is in physical pain, hx chronic pain

## 2012-11-23 ENCOUNTER — Emergency Department (HOSPITAL_COMMUNITY): Payer: No Typology Code available for payment source

## 2012-11-23 LAB — GLUCOSE, CAPILLARY
Glucose-Capillary: 144 mg/dL — ABNORMAL HIGH (ref 70–99)
Glucose-Capillary: 91 mg/dL (ref 70–99)
Glucose-Capillary: 125 mg/dL — ABNORMAL HIGH (ref 70–99)

## 2012-11-23 NOTE — Progress Notes (Signed)
Orthopedic Tech Progress Note Patient Details:  [name] 09-26-1976 [id number]  Ortho Devices Type of Ortho Device: Ulna gutter splint   Haskell Flirt 11/23/2012, 2:59 AM

## 2012-11-23 NOTE — BHH Counselor (Signed)
Mobile Crises worker here at this time working on patients disposition.

## 2012-11-23 NOTE — Progress Notes (Signed)
Per discussion with TA, patient has been referred to [facility], [facility], [facility], and [facility]. TA is also completing [facility] referral as well.   Catha Gosselin, LCSWA  [phone] .11/23/2012 1554pm

## 2012-11-24 LAB — GLUCOSE, CAPILLARY: Glucose-Capillary: 73 mg/dL (ref 70–99)

## 2012-11-24 MED ORDER — TRAZODONE HCL 50 MG PO TABS
50.0000 mg | ORAL_TABLET | Freq: Every evening | ORAL | Status: DC | PRN
  Administered 2012-11-24 (×2): 50 mg via ORAL
  Filled 2012-11-24 (×2): qty 1

## 2012-11-24 NOTE — Progress Notes (Signed)
On 11/22/12 Sent here PMD for psych eval , pt states he wants to kill himself by taking all his meds, Wife states this has been on going x 1 month, pt states he is in physical pain, hx chronic pain On 11/22/12 acting out, screaming, grunting and attempting to hit walls. [place] police remain at bedside.  86 male in [facility] psych ED, medically cleared except Fracture of the proximal right fifth metacarpal bone with soft tissue swelling Seen by [facility] for Ulna gutter splint on  Taking Cymbalta 60 mg qd, klonopin 2 mg bid, ativan 1 mg po trazadone prn qhs Disposition: TA, patient has been referred to [facility], [facility], [facility], and [facility]. TA is also completing [facility] referral as well

## 2012-11-25 ENCOUNTER — Telehealth: Payer: Self-pay | Admitting: *Deleted

## 2012-11-25 DIAGNOSIS — S62308A Unspecified fracture of other metacarpal bone, initial encounter for closed fracture: Secondary | ICD-10-CM

## 2012-11-25 NOTE — Progress Notes (Signed)
[place] conclusion 1) have pt seen by Psychiatrist and for medication management.  He was seen by Dr Berlin Hun who stated pt psychiatrically clear for d/c. Pt discharged home on 11/25/12

## 2012-11-25 NOTE — Telephone Encounter (Signed)
Received call from [facility] with the Mobile Crisis Unit at [facility]. They are discharging pt today and wanted to know the names of the psychiatrists that we referred pt to in the past. Information given and they will forward to pt. Also reports that pt fractured his right hand while in the ER on 11/22/12 and may need referral to orthopedics. Please advise.

## 2012-11-25 NOTE — ED Provider Notes (Addendum)
Pt resting, nad, vitals normal.. Placement pending.   Suzi Roots, MD 11/25/12 [phone]  Mobile crisis states pt does not appear acutely depressed and is having no thoughts of harm to self or others.   Psychaitrist, Dr Berlin Hun, has evaluated pt, states pt psychiatrically clear for d/c.   Pt w normal mood/affect. No acute psychosis, no SI.    Suzi Roots, MD 11/25/12 [phone]

## 2012-11-25 NOTE — Telephone Encounter (Signed)
Referral placed for ortho referral.   

## 2012-11-25 NOTE — BHH Counselor (Signed)
Per mobile crises worker, telepsych recommends discharge home. Mobile crises worker will provide the appropriate follow up referrals to patient and rescind the IVC.

## 2012-11-25 NOTE — ED Notes (Signed)
CBG=43. OJ given. Will recheck in 20 minutes

## 2012-11-25 NOTE — ED Notes (Signed)
Patient is resting comfortably. 

## 2012-11-26 ENCOUNTER — Encounter: Payer: Self-pay | Admitting: Family

## 2012-11-26 ENCOUNTER — Ambulatory Visit (INDEPENDENT_AMBULATORY_CARE_PROVIDER_SITE_OTHER): Payer: No Typology Code available for payment source | Admitting: Family

## 2012-11-26 VITALS — BP 120/70 | HR 89 | Temp 98.5°F | Resp 16 | Wt 242.1 lb

## 2012-11-26 DIAGNOSIS — F329 Major depressive disorder, single episode, unspecified: Secondary | ICD-10-CM

## 2012-11-26 MED ORDER — NIACIN (ANTIHYPERLIPIDEMIC) 500 MG PO TABS: 1.0000 | ORAL_TABLET | Freq: Two times a day (BID) | ORAL | Status: DC

## 2012-11-26 MED ORDER — SIMVASTATIN 20 MG PO TABS: 20.0000 mg | ORAL_TABLET | Freq: Every day | ORAL | Status: DC

## 2012-11-26 MED ORDER — CLONAZEPAM 2 MG PO TABS: 2.0000 mg | ORAL_TABLET | Freq: Two times a day (BID) | ORAL | Status: DC

## 2012-11-26 NOTE — Patient Instructions (Addendum)
Please call to make an appointment with psychiatry as soon as possible. Dr. Cephus Richer [phone] or Dr. Evelene Croon [phone] Return to the ER if you develop recurrent thoughts of hurting yourself or others. Record your sugars and contact us in 1 week with your readings. Check AM sugar.  Increase lantus by 3 units every 3 days until your fasting sugar is below 110- then remain on that dose. Follow up in 1 month.  Novolog sliding scale- check sugar and inject 3 times daily before meals as below:  <150-   Zero units 150-200 2 units 201-250 4 units 251-300 6 units 301-350 8 units 351-400 10 units >400             12 units and contact us.

## 2012-11-26 NOTE — Progress Notes (Signed)
  Subjective:    Patient ID: [name], male    DOB: Mar 08, 1976, 37 y.o.   MRN: [id number]  HPI  [name] is a 37 yr old male who presents today for follow up from the hospital.  He was kept overnight due to suicide ideation. Reports that he is not suicidal at this time. He reports feeling some better, but not sleeping well at night. Requests refill on clonazepam. His medications were not changed at time of discharge.  He is in the process of trying to get appointment with psychiatry.  He did fracture his hand when he was in the hospital due to becoming angry and punching the wall.  He has been referred to orthopedics for this.   Review of Systems    see HPI   Objective:   Physical Exam  Constitutional: He appears well-developed and well-nourished. No distress.  Psychiatric:       Flat affect. Speech is not as slowed as last visit. More engaged today.          Assessment & Plan:

## 2012-11-29 ENCOUNTER — Ambulatory Visit: Payer: No Typology Code available for payment source | Admitting: Family

## 2012-11-29 ENCOUNTER — Telehealth: Payer: Self-pay | Admitting: Family

## 2012-11-29 MED ORDER — AZELASTINE HCL 0.1 % NA SOLN: 2.0000 | NASAL | Status: DC

## 2012-11-29 MED ORDER — CICLESONIDE 50 MCG/ACT NA SUSP: 2.0000 | Freq: Every day | NASAL | Status: DC

## 2012-11-29 NOTE — Telephone Encounter (Signed)
Refill- azelastine 137 mcg nasal spray. Use 1 to 2 sprays in each nostril every day. Qty 30 ml. Last fill 7.30.13  Refill- omnaris nasal spray. 12.5gm. Instill 2 sprays in each nostril daily. Last fill 5.17.2012

## 2012-11-29 NOTE — Telephone Encounter (Signed)
Please advise Re: requests below.

## 2012-11-29 NOTE — Telephone Encounter (Signed)
Rxs sent

## 2012-12-01 ENCOUNTER — Telehealth: Payer: Self-pay | Admitting: *Deleted

## 2012-12-01 DIAGNOSIS — F329 Major depressive disorder, single episode, unspecified: Secondary | ICD-10-CM

## 2012-12-01 DIAGNOSIS — F32A Depression, unspecified: Secondary | ICD-10-CM

## 2012-12-01 NOTE — Telephone Encounter (Signed)
Received message from pt that he has contacted Dr Corrie Dandy [facility] office and will be seeing one of her associates but they are requesting a referral from [place]. Spoke with Victorino Dike at [facility] and she states we can enter the referral and send it directly to her work que instead of faxing all the info. Type bh-op in department field and go from there. She states we can call her if we have any questions. Ph) [phone]   Fax) [phone].

## 2012-12-02 NOTE — Assessment & Plan Note (Addendum)
Will defer further med changes to psychiatry. Pt is instructed to return to ED if recurrent suicide ideation or if he develops homocidal ideation.  He verbalizes understanding.  Refill provided on clonazepam. 25 minutes spent with pt today. >50% of this time was spent counseling pt.

## 2012-12-10 ENCOUNTER — Ambulatory Visit (HOSPITAL_COMMUNITY): Payer: No Typology Code available for payment source | Admitting: Psychiatry

## 2012-12-10 ENCOUNTER — Other Ambulatory Visit: Payer: Self-pay | Admitting: Family

## 2012-12-10 NOTE — Telephone Encounter (Signed)
Refills sent to pharmacy for cymbalta and lovaza. [employer] request denied as pt stated last visit that it was no longer covered under ins. Alternative was sent in on 11/26/12.

## 2012-12-18 ENCOUNTER — Other Ambulatory Visit: Payer: Self-pay | Admitting: Family

## 2012-12-19 ENCOUNTER — Other Ambulatory Visit: Payer: Self-pay | Admitting: Family

## 2012-12-24 ENCOUNTER — Ambulatory Visit: Payer: No Typology Code available for payment source | Admitting: Family

## 2013-01-04 ENCOUNTER — Other Ambulatory Visit: Payer: Self-pay | Admitting: Family

## 2013-01-04 ENCOUNTER — Ambulatory Visit: Payer: No Typology Code available for payment source | Admitting: Family

## 2013-01-06 ENCOUNTER — Ambulatory Visit (HOSPITAL_COMMUNITY): Payer: No Typology Code available for payment source | Admitting: Psychiatry

## 2013-01-10 ENCOUNTER — Ambulatory Visit: Payer: No Typology Code available for payment source | Admitting: Family

## 2013-01-12 ENCOUNTER — Ambulatory Visit: Payer: No Typology Code available for payment source | Admitting: Family

## 2013-01-13 ENCOUNTER — Telehealth: Payer: Self-pay | Admitting: Family

## 2013-01-13 MED ORDER — CLONAZEPAM 2 MG PO TABS: 2.0000 mg | ORAL_TABLET | Freq: Two times a day (BID) | ORAL | Status: DC

## 2013-01-13 NOTE — Telephone Encounter (Signed)
Refill- clonazepam 2mg  tablet. Take one tablet twice daily.

## 2013-01-13 NOTE — Telephone Encounter (Signed)
Refill sent for 30 day supply. Pt last seen 11/26/12 and due for 1 month f/u. Pt has not returned for 1 month f/u. Please call pt to arrange appt before further refills are due.

## 2013-01-17 ENCOUNTER — Ambulatory Visit (HOSPITAL_COMMUNITY): Payer: No Typology Code available for payment source | Admitting: Psychiatry

## 2013-01-18 NOTE — Telephone Encounter (Signed)
Left message for patient to return my call.

## 2013-01-25 NOTE — Telephone Encounter (Signed)
Left message for patient to return my call.

## 2013-01-27 NOTE — Telephone Encounter (Signed)
Left message for patient to return my call.

## 2013-01-28 NOTE — Telephone Encounter (Signed)
Letter mailed to pt.  

## 2013-02-05 ENCOUNTER — Ambulatory Visit (INDEPENDENT_AMBULATORY_CARE_PROVIDER_SITE_OTHER): Payer: No Typology Code available for payment source | Admitting: Family Medicine

## 2013-02-05 ENCOUNTER — Encounter: Payer: Self-pay | Admitting: Family Medicine

## 2013-02-05 VITALS — BP 130/90 | HR 64 | Temp 97.8°F | Wt 238.0 lb

## 2013-02-05 DIAGNOSIS — J329 Chronic sinusitis, unspecified: Secondary | ICD-10-CM

## 2013-02-05 MED ORDER — AMOXICILLIN 875 MG PO TABS: 875.0000 mg | ORAL_TABLET | Freq: Two times a day (BID) | ORAL | Status: DC

## 2013-02-05 MED ORDER — PROMETHAZINE-DM 6.25-15 MG/5ML PO SYRP: 5.0000 mL | ORAL_SOLUTION | Freq: Four times a day (QID) | ORAL | Status: DC | PRN

## 2013-02-05 NOTE — Patient Instructions (Addendum)
This is a sinus infection Start the Amox twice daily- take w/ food Use the cough syrup as needed OTC Mucinex to thin your congestion Drink plenty of fluids REST! Use your inhaler as needed for chest tightness/wheezing STOP SMOKING! Hang in there!!!

## 2013-02-05 NOTE — Assessment & Plan Note (Signed)
New to provider.  [name] sxs and PE consistent w/ infxn.  Start abx.  Cough syrup prn.  Reviewed supportive care and red flags that should prompt return.  Pt expressed understanding and is in agreement w/ plan.

## 2013-02-05 NOTE — Progress Notes (Signed)
  Subjective:    Patient ID: [name], male    DOB: Jun 13, 1976, 37 y.o.   MRN: [id number]  HPI URI- sinus pain/pressure, green nasal drainage, 'i feel like i'm in a barrel', deep hacking cough.  Subjective fevers.  Bilateral ear fullness, L>R.  + sick contacts.  Some dizziness.  Mild nausea, no vomiting.  No diarrhea.   Review of Systems For ROS see HPI     Objective:   Physical Exam  Vitals reviewed. Constitutional: He appears well-developed and well-nourished. No distress.  HENT:  Head: Normocephalic and atraumatic.  Right Ear: Tympanic membrane normal.  Left Ear: Tympanic membrane normal.  Nose: Mucosal edema and rhinorrhea present. Right sinus exhibits maxillary sinus tenderness and frontal sinus tenderness. Left sinus exhibits maxillary sinus tenderness and frontal sinus tenderness.  Mouth/Throat: Mucous membranes are normal. Oropharyngeal exudate and posterior oropharyngeal erythema present. No posterior oropharyngeal edema.  + PND  Eyes: Conjunctivae and EOM are normal. Pupils are equal, round, and reactive to light.  Neck: Normal range of motion. Neck supple.  Cardiovascular: Normal rate, regular rhythm and normal heart sounds.   Pulmonary/Chest: Effort normal and breath sounds normal. No respiratory distress. He has no wheezes.  + hacking cough  Lymphadenopathy:    He has no cervical adenopathy.  Skin: Skin is warm and dry.          Assessment & Plan:

## 2013-02-10 ENCOUNTER — Other Ambulatory Visit: Payer: Self-pay | Admitting: Family

## 2013-02-15 ENCOUNTER — Ambulatory Visit (HOSPITAL_COMMUNITY): Payer: No Typology Code available for payment source | Admitting: Psychiatry

## 2013-03-09 ENCOUNTER — Other Ambulatory Visit: Payer: Self-pay | Admitting: Family

## 2013-03-11 ENCOUNTER — Other Ambulatory Visit: Payer: Self-pay | Admitting: Family

## 2013-03-11 NOTE — Telephone Encounter (Signed)
Rx sent in to pharmacy. 

## 2013-03-21 ENCOUNTER — Other Ambulatory Visit: Payer: Self-pay | Admitting: Family

## 2013-03-23 ENCOUNTER — Telehealth: Payer: Self-pay | Admitting: *Deleted

## 2013-03-23 NOTE — Telephone Encounter (Signed)
Received message from pt's wife stating pt's insurance will not cover Niacin 500mg  2 tablets daily because it is OTC. States it will cover the 1000mg  strength. Pt is wanting to know if we will change Rx to 1000mg  once a day?  Please advise.

## 2013-03-28 ENCOUNTER — Telehealth: Payer: Self-pay | Admitting: *Deleted

## 2013-03-28 NOTE — Telephone Encounter (Signed)
Received fax from [employer] ([employer]) requesting order for pt's diabetic testing supplies. Spoke with pt and advised him that he is past due for follow up and form may not be signed until he is seen for follow up. Pt confirmed use of [employer] and states he will call tomorrow to schedule a follow up. Form forwarded to [employer].

## 2013-03-29 MED ORDER — NIACIN ER (ANTIHYPERLIPIDEMIC) 1000 MG PO TBCR: 1000.0000 mg | EXTENDED_RELEASE_TABLET | Freq: Every day | ORAL | Status: DC

## 2013-03-29 NOTE — Telephone Encounter (Signed)
OK to send supplies but does need OV.

## 2013-03-29 NOTE — Telephone Encounter (Signed)
OK to change and send #30 tabs with 3 refills.

## 2013-03-29 NOTE — Telephone Encounter (Signed)
Rx sent in to pharmacy. 

## 2013-03-31 NOTE — Telephone Encounter (Signed)
Awaiting completed fax from Provider to return to [employer].

## 2013-04-06 ENCOUNTER — Other Ambulatory Visit: Payer: Self-pay | Admitting: Family

## 2013-04-06 NOTE — Telephone Encounter (Signed)
Form faxed to [phone] on 04/05/13.

## 2013-04-07 ENCOUNTER — Other Ambulatory Visit: Payer: Self-pay | Admitting: Family

## 2013-04-08 ENCOUNTER — Ambulatory Visit: Payer: No Typology Code available for payment source | Admitting: Family

## 2013-04-12 ENCOUNTER — Telehealth: Payer: Self-pay | Admitting: Family

## 2013-04-12 ENCOUNTER — Ambulatory Visit (INDEPENDENT_AMBULATORY_CARE_PROVIDER_SITE_OTHER): Payer: No Typology Code available for payment source | Admitting: Family

## 2013-04-12 ENCOUNTER — Encounter: Payer: Self-pay | Admitting: Family

## 2013-04-12 ENCOUNTER — Ambulatory Visit (HOSPITAL_BASED_OUTPATIENT_CLINIC_OR_DEPARTMENT_OTHER)
Admission: RE | Admit: 2013-04-12 | Discharge: 2013-04-12 | Disposition: A | Discharge status: Home / Self Care | Payer: No Typology Code available for payment source | Source: Ambulatory Visit | Attending: Family | Admitting: Family

## 2013-04-12 VITALS — BP 124/84 | HR 85 | Temp 97.8°F | Resp 16 | Wt 231.1 lb

## 2013-04-12 DIAGNOSIS — Z4789 Encounter for other orthopedic aftercare: Secondary | ICD-10-CM | POA: Insufficient documentation

## 2013-04-12 DIAGNOSIS — J019 Acute sinusitis, unspecified: Secondary | ICD-10-CM | POA: Insufficient documentation

## 2013-04-12 DIAGNOSIS — M79609 Pain in unspecified limb: Secondary | ICD-10-CM

## 2013-04-12 DIAGNOSIS — M79641 Pain in right hand: Secondary | ICD-10-CM | POA: Insufficient documentation

## 2013-04-12 MED ORDER — AMOXICILLIN-POT CLAVULANATE 875-125 MG PO TABS: 1.0000 | ORAL_TABLET | Freq: Two times a day (BID) | ORAL | Status: DC

## 2013-04-12 MED ORDER — CETIRIZINE HCL 10 MG PO TABS: 10.0000 mg | ORAL_TABLET | Freq: Two times a day (BID) | ORAL | Status: DC

## 2013-04-12 NOTE — Progress Notes (Signed)
Subjective:    Patient ID: [name], male    DOB: Apr 01, 1976, 37 y.o.   MRN: [id number]  HPI  [name] is a 37 yr old male who presents today with chief complaint of nasal congestion.  Symptoms started 2 weeks ago. Has used otc benadryl/zyrtec, nasal spray. No improvement with these measures. Denies associated fever.  + facial pressure/forehead pressure. Energy is OK.    Review of Systems See HPI  Past Medical History  Diagnosis Date  . Allergy   . Asthma   . Depression   . GERD (gastroesophageal reflux disease)   . Hypertension   . Hyperlipidemia   . Chronic pain disorder     Sees [facility]  . Urinary incontinence     detrusor instability  . Hypogonadism   . Diabetes mellitus     Type II    History   Social History  . Marital Status: Married    Spouse Name: N/A    Number of Children: 2  . Years of Education: N/A   Occupational History  . Disabled     chronic pain   Social History Main Topics  . Smoking status: Current Every Day Smoker -- 0.50 packs/day    Types: Cigarettes  . Smokeless tobacco: Never Used  . Alcohol Use: No  . Drug Use: No     Comment: stopped cocaine--2003. Normal drug screen March 2008  . Sexually Active: Not on file   Other Topics Concern  . Not on file   Social History Narrative  . No narrative on file    Past Surgical History  Procedure Laterality Date  . Appendectomy    . Hernia repair    . Nasal sinus surgery  2008    Family History  Problem Relation Age of Onset  . Diabetes Mother   . Hypertension Mother   . Cirrhosis Mother   . Arthritis Father 49    osteoarthritis  . Diabetes Sister     borderline  . Heart disease Paternal Uncle 61    MI  . Heart disease Maternal Grandfather     late 60's--MI  . Cancer Paternal Grandmother 18    lung  . Heart disease Paternal Grandfather 28    MI    Allergies  Allergen Reactions  . Ciprofloxacin Hives    Current Outpatient Prescriptions on File  Prior to Visit  Medication Sig Dispense Refill  . azelastine (ASTELIN) 137 MCG/SPRAY nasal spray Place 2 sprays into the nose every morning. Use in each nostril as directed  30 mL  5  . cetirizine (ZYRTEC) 10 MG tablet Take 10 mg by mouth 2 (two) times daily.       . chlorzoxazone (PARAFON) 500 MG tablet Take 500 mg by mouth 3 (three) times daily as needed. For muscle pain      . ciclesonide (OMNARIS) 50 MCG/ACT nasal spray Place 2 sprays into both nostrils daily.  12.5 g  5  . clonazePAM (KLONOPIN) 2 MG tablet TAKE 1 TABLET BY MOUTH TWICE A DAY  60 tablet  0  . DULoxetine (CYMBALTA) 60 MG capsule TAKE ONE CAPSULE BY MOUTH EVERY DAY  30 capsule  2  . Lancet Devices (AUTO-LANCET) MISC Check blood sugar 4 times daily.       Marland Kitchen LOVAZA 1 G capsule TAKE 4 CAPSULES BY MOUTH DAILY  120 capsule  2  . metFORMIN (GLUCOPHAGE) 1000 MG tablet TAKE 1 TABLET BY MOUTH TWICE A DAY AS DIRECTED  60  tablet  5  . nabumetone (RELAFEN) 750 MG tablet Take 750 mg by mouth 2 (two) times daily.        . niacin (NIASPAN) 1000 MG CR tablet Take 1 tablet (1,000 mg total) by mouth daily.  30 tablet  3  . NOVOLOG FLEXPEN 100 UNIT/ML SOPN FlexPen INJECT 25-32 UNITS INTO THE SKIN 3 (THREE) TIMES DAILY BEFORE MEALS.  3 mL  2  . omega-3 acid ethyl esters (LOVAZA) 1 G capsule Take 4 g by mouth at bedtime.      . pregabalin (LYRICA) 100 MG capsule Take 100 mg by mouth 3 (three) times daily.        Marland Kitchen PROAIR HFA 108 (90 BASE) MCG/ACT inhaler INHALE 2 PUFFS INTO THE LUNGS EVERY 6 (SIX) HOURS AS NEEDED FOR WHEEZING.  1 Inhaler  11  . simvastatin (ZOCOR) 20 MG tablet Take 1 tablet (20 mg total) by mouth daily.  30 tablet  2  . tadalafil (CIALIS) 5 MG tablet Take 5 mg by mouth daily.      . traZODone (DESYREL) 50 MG tablet Take 25 mg by mouth at bedtime.      . [DISCONTINUED] albuterol (PROVENTIL) 90 MCG/ACT inhaler Inhale 2 puffs into the lungs every 6 (six) hours as needed for wheezing.  17 g  12   No current facility-administered  medications on file prior to visit.    BP 124/84  Pulse 85  Temp(Src) 97.8 F (36.6 C) (Oral)  Resp 16  Wt 231 lb 1.3 oz (104.817 kg)  BMI 30.49 kg/m2  SpO2 98%       Objective:   Physical Exam  Constitutional: He is oriented to person, place, and time. He appears well-developed and well-nourished. No distress.  HENT:  Head: Normocephalic and atraumatic.  Mouth/Throat: No oropharyngeal exudate, posterior oropharyngeal edema or posterior oropharyngeal erythema.  + frontal and maxillary sinus tenderness to palpation.  Cardiovascular: Normal rate and regular rhythm.   No murmur heard. Pulmonary/Chest: Effort normal and breath sounds normal. No respiratory distress. He has no wheezes. He has no rales. He exhibits no tenderness.  Musculoskeletal: He exhibits no edema.  Neurological: He is alert and oriented to person, place, and time.  Psychiatric: He has a normal mood and affect. His behavior is normal. Judgment and thought content normal.  Some ? Laxity noted of right 5th metacarpal base.          Assessment & Plan:

## 2013-04-12 NOTE — Assessment & Plan Note (Signed)
Hx of fracture, check CXR. He has followed with [facility] in the past.

## 2013-04-12 NOTE — Telephone Encounter (Signed)
Please call pt and let him know that the hand still is showing fracture.  I recommend that that he call his orthopedist, Dr. Janee Morn at [facility] to arrange a follow up appointment.

## 2013-04-12 NOTE — Assessment & Plan Note (Signed)
Continue zyrtec add augmentin x 10 days.

## 2013-04-12 NOTE — Patient Instructions (Addendum)
Please complete your x ray prior to leaving. Call if sinus symptoms worse, or if not improved in 2-3 days.

## 2013-04-13 NOTE — Telephone Encounter (Signed)
Notified pt and he voices understanding. 

## 2013-04-21 ENCOUNTER — Other Ambulatory Visit: Payer: Self-pay | Admitting: Family

## 2013-04-22 NOTE — Telephone Encounter (Signed)
Last clonazepam rx given 02/2013 #60 x no refills. Please advise.

## 2013-04-28 ENCOUNTER — Telehealth: Payer: Self-pay | Admitting: Family

## 2013-04-28 MED ORDER — OMEGA-3-ACID ETHYL ESTERS 1 G PO CAPS: ORAL_CAPSULE | ORAL | Status: DC

## 2013-04-28 MED ORDER — METFORMIN HCL 1000 MG PO TABS: ORAL_TABLET | ORAL | Status: DC

## 2013-04-28 MED ORDER — DULOXETINE HCL 60 MG PO CPEP: ORAL_CAPSULE | ORAL | Status: DC

## 2013-04-28 MED ORDER — SIMVASTATIN 20 MG PO TABS: 20.0000 mg | ORAL_TABLET | Freq: Every day | ORAL | Status: DC

## 2013-04-28 MED ORDER — NIACIN ER (ANTIHYPERLIPIDEMIC) 1000 MG PO TBCR: 1000.0000 mg | EXTENDED_RELEASE_TABLET | Freq: Every day | ORAL | Status: DC

## 2013-04-28 NOTE — Telephone Encounter (Signed)
lovaza 1gm capsule.   Simvastatin 20mg  tablet  duloxetine hcl dr 60mg  cap  Metformin hcl 1000mg   Niacin er 1,000mg 

## 2013-04-28 NOTE — Telephone Encounter (Signed)
[facility] 

## 2013-06-06 ENCOUNTER — Other Ambulatory Visit: Payer: Self-pay | Admitting: Family

## 2013-06-06 NOTE — Telephone Encounter (Signed)
OK to send refill.  

## 2013-06-06 NOTE — Telephone Encounter (Signed)
Clonazepam last filled 05/09/13.  Please advise.

## 2013-06-06 NOTE — Telephone Encounter (Signed)
Refill left on pharmacy voicemail. 

## 2013-06-07 ENCOUNTER — Ambulatory Visit: Payer: No Typology Code available for payment source | Admitting: Family

## 2013-06-14 ENCOUNTER — Ambulatory Visit (INDEPENDENT_AMBULATORY_CARE_PROVIDER_SITE_OTHER): Payer: No Typology Code available for payment source | Admitting: Family

## 2013-06-14 ENCOUNTER — Telehealth: Payer: Self-pay | Admitting: Family

## 2013-06-14 ENCOUNTER — Encounter: Payer: Self-pay | Admitting: Family

## 2013-06-14 ENCOUNTER — Telehealth: Payer: Self-pay | Admitting: *Deleted

## 2013-06-14 VITALS — BP 100/76 | HR 116 | Temp 98.0°F | Resp 18 | Wt 228.1 lb

## 2013-06-14 DIAGNOSIS — E119 Type 2 diabetes mellitus without complications: Secondary | ICD-10-CM

## 2013-06-14 DIAGNOSIS — F3289 Other specified depressive episodes: Secondary | ICD-10-CM

## 2013-06-14 DIAGNOSIS — I10 Essential (primary) hypertension: Secondary | ICD-10-CM

## 2013-06-14 DIAGNOSIS — E118 Type 2 diabetes mellitus with unspecified complications: Secondary | ICD-10-CM

## 2013-06-14 DIAGNOSIS — F329 Major depressive disorder, single episode, unspecified: Secondary | ICD-10-CM

## 2013-06-14 DIAGNOSIS — F32A Depression, unspecified: Secondary | ICD-10-CM

## 2013-06-14 DIAGNOSIS — E785 Hyperlipidemia, unspecified: Secondary | ICD-10-CM

## 2013-06-14 LAB — HEMOGLOBIN A1C: Mean Plasma Glucose: 226 mg/dL — ABNORMAL HIGH (ref <117)

## 2013-06-14 NOTE — Progress Notes (Signed)
Subjective:    Patient ID: [name], male    DOB: [birth year], 37 y.o.   MRN: [id number]  HPI  [name] is a 37 yr old male who presents today for follow up.  Depression- Pt reports unstable mood.  Feels sluggish.  Denies drug use.  Reports trouble getting out of bed. Reports that when "the fronts come through" his pain is worse. The worsening pain affects his depression. Denies current thoughts of suicide. Reports that he has had some recent thoughts especially when he is in pain that "I don't want to do this anymore."  However he reports that he has never had a plan for hurting himself or others.  He denies any of these thoughts today.  Yesterday he had episode or rage/screeming/yelling.  He reports that he "blacks out and does not remember what he has done."  He is maintained on cymbalta, though is is mainly used for his fibromyalgia.  Per wife, the pt does not have hx of manic episodes.  Reports difficulty staying focused.    DM2- He would like to see [name] as he has followed with him in the past. [name] recently joined up with [facility].  Reports that his sugars have been running as high as 300-400's.  He is currently on lantus 68 units, novolog sliding scale. He is also maintained on metformin.  Reports that he does not always remember to dose the sliding scale AC meals.   HTN- he is currently not on meds for his blood pressure.  BP today is 100/76  Review of Systems See HPI  Past Medical History  Diagnosis Date  . Allergy   . Asthma   . Depression   . GERD (gastroesophageal reflux disease)   . Hypertension   . Hyperlipidemia   . Chronic pain disorder     Sees [facility]  . Urinary incontinence     detrusor instability  . Hypogonadism   . Diabetes mellitus     Type II    History   Social History  . Marital Status: Married    Spouse Name: N/A    Number of Children: 2  . Years of Education: N/A   Occupational History  . Disabled     chronic  pain   Social History Main Topics  . Smoking status: Current Every Day Smoker -- 0.50 packs/day    Types: Cigarettes  . Smokeless tobacco: Never Used  . Alcohol Use: No  . Drug Use: No     Comment: stopped cocaine--2003. Normal drug screen March 2008  . Sexually Active: Not on file   Other Topics Concern  . Not on file   Social History Narrative  . No narrative on file    Past Surgical History  Procedure Laterality Date  . Appendectomy    . Hernia repair    . Nasal sinus surgery  2008    Family History  Problem Relation Age of Onset  . Diabetes Mother   . Hypertension Mother   . Cirrhosis Mother   . Arthritis Father 49    osteoarthritis  . Diabetes Sister     borderline  . Heart disease Paternal Uncle 60    MI  . Heart disease Maternal Grandfather     late 60's--MI  . Cancer Paternal Grandmother 5    lung  . Heart disease Paternal Grandfather 35    MI    Allergies  Allergen Reactions  . Ciprofloxacin Hives    Current  Outpatient Prescriptions on File Prior to Visit  Medication Sig Dispense Refill  . azelastine (ASTELIN) 137 MCG/SPRAY nasal spray Place 2 sprays into the nose every morning. Use in each nostril as directed  30 mL  5  . cetirizine (ZYRTEC) 10 MG tablet Take 1 tablet (10 mg total) by mouth 2 (two) times daily.  60 tablet  5  . chlorzoxazone (PARAFON) 500 MG tablet Take 500 mg by mouth 3 (three) times daily as needed. For muscle pain      . ciclesonide (OMNARIS) 50 MCG/ACT nasal spray Place 2 sprays into both nostrils daily.  12.5 g  5  . clonazePAM (KLONOPIN) 2 MG tablet TAKE 1 TABLET BY MOUTH TWICE A DAY  60 tablet  0  . DULoxetine (CYMBALTA) 60 MG capsule TAKE ONE CAPSULE BY MOUTH EVERY DAY  30 capsule  2  . Lancet Devices (AUTO-LANCET) MISC Check blood sugar 4 times daily.       . metFORMIN (GLUCOPHAGE) 1000 MG tablet TAKE 1 TABLET BY MOUTH TWICE A DAY AS DIRECTED  60 tablet  2  . nabumetone (RELAFEN) 750 MG tablet Take 750 mg by mouth 2 (two)  times daily.        . niacin (NIASPAN) 1000 MG CR tablet Take 1 tablet (1,000 mg total) by mouth daily.  30 tablet  2  . NOVOLOG FLEXPEN 100 UNIT/ML SOPN FlexPen INJECT 25-32 UNITS INTO THE SKIN 3 (THREE) TIMES DAILY BEFORE MEALS.  3 mL  2  . omega-3 acid ethyl esters (LOVAZA) 1 G capsule Take 4 g by mouth at bedtime.      Marland Kitchen omega-3 acid ethyl esters (LOVAZA) 1 G capsule TAKE 4 CAPSULES BY MOUTH DAILY  120 capsule  2  . pregabalin (LYRICA) 100 MG capsule Take 100 mg by mouth 3 (three) times daily.        Marland Kitchen PROAIR HFA 108 (90 BASE) MCG/ACT inhaler INHALE 2 PUFFS INTO THE LUNGS EVERY 6 (SIX) HOURS AS NEEDED FOR WHEEZING.  1 Inhaler  11  . simvastatin (ZOCOR) 20 MG tablet Take 1 tablet (20 mg total) by mouth daily.  30 tablet  2  . tadalafil (CIALIS) 5 MG tablet Take 5 mg by mouth daily.      . Tapentadol HCl 250 MG TB12 Take 250 mg by mouth 2 (two) times daily.      . traZODone (DESYREL) 50 MG tablet Take 25 mg by mouth at bedtime.      . Insulin Glargine 100 UNIT/ML SOPN Inject 68 Units into the skin at bedtime.       . [DISCONTINUED] albuterol (PROVENTIL) 90 MCG/ACT inhaler Inhale 2 puffs into the lungs every 6 (six) hours as needed for wheezing.  17 g  12   No current facility-administered medications on file prior to visit.    BP 100/76  Pulse 116  Temp(Src) 98 F (36.7 C) (Oral)  Resp 18  Wt 228 lb 1.9 oz (103.475 kg)  BMI 30.1 kg/m2  SpO2 98%       Objective:   Physical Exam  Constitutional: He is oriented to person, place, and time. He appears well-developed and well-nourished.  Flat affect, NAD  HENT:  Head: Normocephalic and atraumatic.  Cardiovascular: Normal rate and regular rhythm.   No murmur heard. Pulmonary/Chest: Effort normal and breath sounds normal. No respiratory distress. He has no wheezes. He has no rales. He exhibits no tenderness.  Musculoskeletal: He exhibits no edema.  Neurological: He is alert and oriented to  person, place, and time.  Psychiatric:  Judgment and thought content normal. He is slowed. He is not agitated, not aggressive, not hyperactive and not combative. He expresses no suicidal plans and no homicidal plans.  Thought process and speech appear slowed.  Affect is flat and he is tearful at times during the interview.          Assessment & Plan:

## 2013-06-14 NOTE — Patient Instructions (Addendum)
Please complete lab work prior to leaving. You will be contacted about your referral to psychiatry and endocrinology.  Please let us know if you have not heard back within 1 week about your referrals. Go to the ER if you develop thoughts of hurting yourself or others.

## 2013-06-14 NOTE — Telephone Encounter (Signed)
Message copied by Sandford Craze on Tue Jun 14, 2013  3:57 PM ------      Message from: Eulah Pont      Created: Tue Jun 14, 2013  2:21 PM      Regarding: Psychriatic referral        FYI    Called  [name] for  His appt   They will call patient ------

## 2013-06-14 NOTE — Assessment & Plan Note (Signed)
Refer to Dr. Lucianne Muss- urged compliance with insulin dosing schedule.

## 2013-06-14 NOTE — Telephone Encounter (Signed)
Received message from pt stating he forgot to ask for something to help his nausea at today's office visit. States OTC meds have not helped and would like Rx sent to [facility].  Please advise.

## 2013-06-14 NOTE — Assessment & Plan Note (Signed)
BP is stable. Not currently on meds.  Could consider ACE for renal protection, but I do not believe that his low BP could support addition of ace at this time. Urine microalbumin neg.

## 2013-06-14 NOTE — Assessment & Plan Note (Addendum)
Deteriorated. Refer to Psychiatry as soon as possible.  Pt contracts for safety and agrees to go to the ER if he develops suicidal or homicidal ideation.

## 2013-06-15 ENCOUNTER — Telehealth: Payer: Self-pay | Admitting: *Deleted

## 2013-06-15 ENCOUNTER — Telehealth: Payer: Self-pay | Admitting: Family

## 2013-06-15 DIAGNOSIS — E291 Testicular hypofunction: Secondary | ICD-10-CM

## 2013-06-15 LAB — BASIC METABOLIC PANEL
CO2: 23 mEq/L (ref 19–32)
Chloride: 93 mEq/L — ABNORMAL LOW (ref 96–112)
Glucose, Bld: 422 mg/dL — ABNORMAL HIGH (ref 70–99)
Potassium: 4.8 mEq/L (ref 3.5–5.3)
Sodium: 128 mEq/L — ABNORMAL LOW (ref 135–145)

## 2013-06-15 LAB — HEPATIC FUNCTION PANEL
ALT: 28 U/L (ref 0–53)
AST: 24 U/L (ref 0–37)
Albumin: 4.8 g/dL (ref 3.5–5.2)
Alkaline Phosphatase: 95 U/L (ref 39–117)
Indirect Bilirubin: 0.5 mg/dL (ref 0.0–0.9)
Total Protein: 7.1 g/dL (ref 6.0–8.3)

## 2013-06-15 MED ORDER — ONDANSETRON HCL 4 MG PO TABS: 4.0000 mg | ORAL_TABLET | Freq: Three times a day (TID) | ORAL | Status: DC | PRN

## 2013-06-15 NOTE — Telephone Encounter (Signed)
Notified pt and he voices understanding. 

## 2013-06-15 NOTE — Telephone Encounter (Signed)
Please let pt know that his sugar is >400.  I would like him to increase his lantus from 68 to 78 units please.

## 2013-06-15 NOTE — Telephone Encounter (Signed)
rx sent for zofran

## 2013-06-15 NOTE — Telephone Encounter (Signed)
Received message from pt requesting referral to a different urologist for low testosterone. Reports that he saw [facility] in the past but had to cancel and r/s appts so he states they will no longer see him. Pt also requests referral to urologist that will do testosterone pellets as he doesn't feel the gel worked well in the past.  Please advise.

## 2013-06-15 NOTE — Telephone Encounter (Signed)
Notified pt. He states pharmacy told him we would need to complete paperwork for insurance to cover Zofran and they were supposed to have faxed it to [place] last night. Advised pt I would call him back once we receive fax and know what is being requested.

## 2013-06-19 ENCOUNTER — Other Ambulatory Visit: Payer: Self-pay | Admitting: Family

## 2013-06-22 ENCOUNTER — Other Ambulatory Visit: Payer: Self-pay | Admitting: Family

## 2013-06-22 NOTE — Telephone Encounter (Signed)
Refused rx, to soon for refill.

## 2013-06-23 NOTE — Telephone Encounter (Signed)
PA was completed and approved. Pt and pharmacy were notified.

## 2013-06-27 ENCOUNTER — Telehealth: Payer: Self-pay | Admitting: Family

## 2013-06-27 DIAGNOSIS — F329 Major depressive disorder, single episode, unspecified: Secondary | ICD-10-CM

## 2013-06-27 DIAGNOSIS — F32A Depression, unspecified: Secondary | ICD-10-CM

## 2013-06-27 NOTE — Telephone Encounter (Signed)
See referral

## 2013-06-27 NOTE — Telephone Encounter (Signed)
Message copied by Sandford Craze on Mon Jun 27, 2013  1:15 PM ------      Message from: Darral Dash E      Created: Mon Jun 27, 2013 11:01 AM      Regarding: Referral       Patient had referral to Psychiatry,  We sent to [facility]  ,pt has Medicaid and they will not see him,  They are not taking New Medicaid pt at present .   Please enter new referral for Psychiatry I will call  [facility],  If that's ok, due to pt having Medicaid don't know any one else for him to see. ------

## 2013-06-29 ENCOUNTER — Ambulatory Visit: Payer: No Typology Code available for payment source | Admitting: Endocrinology

## 2013-07-03 ENCOUNTER — Other Ambulatory Visit: Payer: Self-pay | Admitting: Family

## 2013-07-04 NOTE — Telephone Encounter (Signed)
Notified pt's wife and she voices understanding. States she just wanted to make sure we received the request.

## 2013-07-04 NOTE — Telephone Encounter (Signed)
OK to refill tomorrow. #60 no refills.

## 2013-07-04 NOTE — Telephone Encounter (Signed)
Please advise:  Medication name:  Name from pharmacy:  clonazePAM (KLONOPIN) 2 MG tablet  CLONAZEPAM 2 MG TABLET Sig: TAKE 1 TABLET TWICE DAILY Dispense: 60 tablet Refills: 0 Start: 07/03/2013 Class: Normal Requested on: 06/06/2013 Originally ordered on: 11/27/2010 Last refill: 06/06/2013

## 2013-07-05 NOTE — Telephone Encounter (Signed)
Refill called to pharmacist. Notified pt's wife.

## 2013-07-07 ENCOUNTER — Ambulatory Visit (HOSPITAL_COMMUNITY): Payer: No Typology Code available for payment source | Admitting: Psychiatry

## 2013-07-07 ENCOUNTER — Other Ambulatory Visit: Payer: Self-pay | Admitting: Family

## 2013-07-07 NOTE — Telephone Encounter (Signed)
Spoke with pt's wife. She states that pt has increased Lantus to 84 units at bedtime.  Current med list states 68 units and rx request from pharmacy says 44 units.  Is it ok to send rx for 84 units?

## 2013-07-07 NOTE — Telephone Encounter (Signed)
Yes

## 2013-07-08 NOTE — Telephone Encounter (Signed)
Refill sent.

## 2013-07-14 ENCOUNTER — Emergency Department (EMERGENCY_DEPARTMENT_HOSPITAL)
Admission: EM | Admit: 2013-07-14 | Discharge: 2013-07-15 | Disposition: A | Discharge status: Other Acute Inpatient Hospital | Payer: No Typology Code available for payment source | Source: Home / Self Care | Attending: Emergency Medicine | Admitting: Emergency Medicine

## 2013-07-14 ENCOUNTER — Telehealth (HOSPITAL_COMMUNITY): Payer: Self-pay

## 2013-07-14 ENCOUNTER — Encounter (HOSPITAL_COMMUNITY): Payer: Self-pay | Admitting: Emergency Medicine

## 2013-07-14 ENCOUNTER — Ambulatory Visit (HOSPITAL_COMMUNITY): Payer: Self-pay | Admitting: Psychiatry

## 2013-07-14 DIAGNOSIS — M549 Dorsalgia, unspecified: Secondary | ICD-10-CM | POA: Diagnosis present

## 2013-07-14 DIAGNOSIS — E1165 Type 2 diabetes mellitus with hyperglycemia: Secondary | ICD-10-CM | POA: Diagnosis present

## 2013-07-14 DIAGNOSIS — F419 Anxiety disorder, unspecified: Secondary | ICD-10-CM | POA: Diagnosis present

## 2013-07-14 DIAGNOSIS — F329 Major depressive disorder, single episode, unspecified: Secondary | ICD-10-CM

## 2013-07-14 DIAGNOSIS — IMO0001 Reserved for inherently not codable concepts without codable children: Secondary | ICD-10-CM | POA: Diagnosis present

## 2013-07-14 DIAGNOSIS — E118 Type 2 diabetes mellitus with unspecified complications: Secondary | ICD-10-CM

## 2013-07-14 DIAGNOSIS — F332 Major depressive disorder, recurrent severe without psychotic features: Secondary | ICD-10-CM | POA: Diagnosis present

## 2013-07-14 DIAGNOSIS — E871 Hypo-osmolality and hyponatremia: Secondary | ICD-10-CM | POA: Diagnosis present

## 2013-07-14 DIAGNOSIS — IMO0002 Reserved for concepts with insufficient information to code with codable children: Secondary | ICD-10-CM | POA: Diagnosis present

## 2013-07-14 DIAGNOSIS — R739 Hyperglycemia, unspecified: Secondary | ICD-10-CM

## 2013-07-14 DIAGNOSIS — I10 Essential (primary) hypertension: Secondary | ICD-10-CM | POA: Diagnosis present

## 2013-07-14 DIAGNOSIS — E785 Hyperlipidemia, unspecified: Secondary | ICD-10-CM | POA: Diagnosis present

## 2013-07-14 DIAGNOSIS — F411 Generalized anxiety disorder: Secondary | ICD-10-CM

## 2013-07-14 DIAGNOSIS — R45851 Suicidal ideations: Secondary | ICD-10-CM

## 2013-07-14 DIAGNOSIS — F17200 Nicotine dependence, unspecified, uncomplicated: Secondary | ICD-10-CM | POA: Diagnosis present

## 2013-07-14 LAB — CBC
HCT: 40.4 % (ref 39.0–52.0)
Hemoglobin: 14.2 g/dL (ref 13.0–17.0)
MCH: 30.9 pg (ref 26.0–34.0)
MCHC: 35.1 g/dL (ref 30.0–36.0)
MCV: 87.8 fL (ref 78.0–100.0)
Platelets: 227 K/uL (ref 150–400)
RBC: 4.6 MIL/uL (ref 4.22–5.81)
RDW: 12.8 % (ref 11.5–15.5)
WBC: 13.7 K/uL — ABNORMAL HIGH (ref 4.0–10.5)

## 2013-07-14 LAB — COMPREHENSIVE METABOLIC PANEL WITH GFR
ALT: 31 U/L (ref 0–53)
AST: 29 U/L (ref 0–37)
CO2: 27 meq/L (ref 19–32)
Chloride: 93 meq/L — ABNORMAL LOW (ref 96–112)
Creatinine, Ser: 0.64 mg/dL (ref 0.50–1.35)
GFR calc non Af Amer: >90 mL/min (ref >90)
Glucose, Bld: 471 mg/dL — ABNORMAL HIGH (ref 70–99)
Sodium: 131 meq/L — ABNORMAL LOW (ref 135–145)
Total Bilirubin: 0.3 mg/dL (ref 0.3–1.2)

## 2013-07-14 LAB — RAPID URINE DRUG SCREEN, HOSP PERFORMED
Amphetamines: NOT DETECTED
Barbiturates: NOT DETECTED
Benzodiazepines: NOT DETECTED
Cocaine: NOT DETECTED
Opiates: NOT DETECTED
Tetrahydrocannabinol: NOT DETECTED

## 2013-07-14 LAB — ACETAMINOPHEN LEVEL: Acetaminophen (Tylenol), Serum: <15 ug/mL (ref 10–30)

## 2013-07-14 LAB — COMPREHENSIVE METABOLIC PANEL
Albumin: 4.2 g/dL (ref 3.5–5.2)
Alkaline Phosphatase: 90 U/L (ref 39–117)
BUN: 8 mg/dL (ref 6–23)
Calcium: 10.1 mg/dL (ref 8.4–10.5)
GFR calc Af Amer: >90 mL/min (ref >90)
Potassium: 4.2 mEq/L (ref 3.5–5.1)
Total Protein: 7.3 g/dL (ref 6.0–8.3)

## 2013-07-14 LAB — SALICYLATE LEVEL: Salicylate Lvl: <2 mg/dL — ABNORMAL LOW (ref 2.8–20.0)

## 2013-07-14 LAB — GLUCOSE, CAPILLARY
Glucose-Capillary: 412 mg/dL — ABNORMAL HIGH (ref 70–99)
Glucose-Capillary: 360 mg/dL — ABNORMAL HIGH (ref 70–99)

## 2013-07-14 LAB — ETHANOL: Alcohol, Ethyl (B): <11 mg/dL (ref 0–11)

## 2013-07-14 MED ORDER — PREGABALIN 50 MG PO CAPS
100.0000 mg | ORAL_CAPSULE | Freq: Three times a day (TID) | ORAL | Status: DC
  Administered 2013-07-14 – 2013-07-15 (×3): 100 mg via ORAL
  Filled 2013-07-14 (×3): qty 2

## 2013-07-14 MED ORDER — SIMVASTATIN 20 MG PO TABS
20.0000 mg | ORAL_TABLET | Freq: Every day | ORAL | Status: DC
  Administered 2013-07-15: 20 mg via ORAL
  Filled 2013-07-14: qty 1

## 2013-07-14 MED ORDER — NIACIN ER (ANTIHYPERLIPIDEMIC) 500 MG PO TBCR
1000.0000 mg | EXTENDED_RELEASE_TABLET | Freq: Every day | ORAL | Status: DC
  Administered 2013-07-15: 1000 mg via ORAL
  Filled 2013-07-14: qty 2

## 2013-07-14 MED ORDER — LORAZEPAM 1 MG PO TABS
1.0000 mg | ORAL_TABLET | Freq: Three times a day (TID) | ORAL | Status: DC | PRN
  Administered 2013-07-14: 1 mg via ORAL
  Filled 2013-07-14: qty 1

## 2013-07-14 MED ORDER — OMEGA-3-ACID ETHYL ESTERS 1 G PO CAPS
4.0000 g | ORAL_CAPSULE | Freq: Every day | ORAL | Status: DC
  Administered 2013-07-14: 4 g via ORAL
  Filled 2013-07-14 (×2): qty 4

## 2013-07-14 MED ORDER — LORATADINE 10 MG PO TABS
10.0000 mg | ORAL_TABLET | Freq: Every day | ORAL | Status: DC
  Administered 2013-07-15: 10 mg via ORAL
  Filled 2013-07-14: qty 1

## 2013-07-14 MED ORDER — INSULIN ASPART 100 UNIT/ML ~~LOC~~ SOLN: 25.0000 [IU] | Freq: Three times a day (TID) | SUBCUTANEOUS | Status: DC

## 2013-07-14 MED ORDER — ALBUTEROL SULFATE HFA 108 (90 BASE) MCG/ACT IN AERS: 1.0000 | INHALATION_SPRAY | Freq: Four times a day (QID) | RESPIRATORY_TRACT | Status: DC | PRN

## 2013-07-14 MED ORDER — NICOTINE 21 MG/24HR TD PT24
21.0000 mg | MEDICATED_PATCH | Freq: Once | TRANSDERMAL | Status: DC
  Administered 2013-07-14: 21 mg via TRANSDERMAL
  Filled 2013-07-14 (×2): qty 1

## 2013-07-14 MED ORDER — INSULIN ASPART 100 UNIT/ML ~~LOC~~ SOLN: 0.0000 [IU] | Freq: Three times a day (TID) | SUBCUTANEOUS | Status: DC

## 2013-07-14 MED ORDER — AZELASTINE HCL 0.1 % NA SOLN
2.0000 | Freq: Every day | NASAL | Status: DC
  Administered 2013-07-15: 2 via NASAL
  Filled 2013-07-14: qty 30

## 2013-07-14 MED ORDER — CHLORZOXAZONE 500 MG PO TABS
500.0000 mg | ORAL_TABLET | Freq: Three times a day (TID) | ORAL | Status: DC | PRN
  Filled 2013-07-14: qty 1

## 2013-07-14 MED ORDER — INSULIN ASPART 100 UNIT/ML ~~LOC~~ SOLN
0.0000 [IU] | Freq: Every day | SUBCUTANEOUS | Status: DC
  Administered 2013-07-14: 5 [IU] via SUBCUTANEOUS
  Filled 2013-07-14: qty 1

## 2013-07-14 MED ORDER — METFORMIN HCL 500 MG PO TABS
1000.0000 mg | ORAL_TABLET | Freq: Two times a day (BID) | ORAL | Status: DC
  Administered 2013-07-15: 1000 mg via ORAL
  Filled 2013-07-14 (×3): qty 2

## 2013-07-14 MED ORDER — CLONAZEPAM 0.5 MG PO TABS
2.0000 mg | ORAL_TABLET | Freq: Every day | ORAL | Status: DC
  Administered 2013-07-14 – 2013-07-15 (×2): 2 mg via ORAL
  Filled 2013-07-14 (×2): qty 2

## 2013-07-14 MED ORDER — DULOXETINE HCL 60 MG PO CPEP
60.0000 mg | ORAL_CAPSULE | Freq: Every day | ORAL | Status: DC
  Administered 2013-07-14 – 2013-07-15 (×2): 60 mg via ORAL
  Filled 2013-07-14 (×2): qty 1

## 2013-07-14 MED ORDER — TAPENTADOL HCL ER 250 MG PO TB12: 250.0000 mg | ORAL_TABLET | Freq: Two times a day (BID) | ORAL | Status: DC

## 2013-07-14 MED ORDER — ONDANSETRON HCL 4 MG PO TABS: 4.0000 mg | ORAL_TABLET | Freq: Three times a day (TID) | ORAL | Status: DC | PRN

## 2013-07-14 MED ORDER — TRAZODONE HCL 50 MG PO TABS
25.0000 mg | ORAL_TABLET | Freq: Every day | ORAL | Status: DC
  Administered 2013-07-14: 25 mg via ORAL
  Filled 2013-07-14: qty 1
  Filled 2013-07-14: qty 2

## 2013-07-14 MED ORDER — CICLESONIDE 50 MCG/ACT NA SUSP: 2.0000 | Freq: Every day | NASAL | Status: DC

## 2013-07-14 MED ORDER — METFORMIN HCL 500 MG PO TABS
1000.0000 mg | ORAL_TABLET | Freq: Two times a day (BID) | ORAL | Status: DC
  Filled 2013-07-14 (×3): qty 2

## 2013-07-14 MED ORDER — NABUMETONE 750 MG PO TABS
750.0000 mg | ORAL_TABLET | Freq: Two times a day (BID) | ORAL | Status: DC
  Administered 2013-07-14 – 2013-07-15 (×2): 750 mg via ORAL
  Filled 2013-07-14 (×3): qty 1

## 2013-07-14 MED ORDER — INSULIN GLARGINE 100 UNIT/ML ~~LOC~~ SOLN
84.0000 [IU] | Freq: Every day | SUBCUTANEOUS | Status: DC
  Administered 2013-07-14: 84 [IU] via SUBCUTANEOUS
  Filled 2013-07-14 (×2): qty 0.84

## 2013-07-14 NOTE — Telephone Encounter (Signed)
2:41pm 07/14/13 Patient's wife called stating that the son had the car for orientation and therefore he can't make the appt - the patient's wife stated that he really need to be seen because he need to be in-patient - spoke with Dr. Lolly Mustache - suggested go directly to the ED for assessment for possible in-patient status - Dr. Lolly Mustache requested that I Nettie Elm) [phone] to give them heads-up - I spoke with Elijah Birk to give him some information about the patient going to the ED for in-patient/sh

## 2013-07-14 NOTE — Consult Note (Signed)
[facility] Face-to-Face Psychiatry Consult   Reason for Consult:  Suicidal ideations Referring Physician:  ED MD [name] is an 37 y.o. male.  Assessment: AXIS I:  Anxiety Disorder NOS and Major Depression, Recurrent severe AXIS II:  Deferred AXIS III:   Past Medical History  Diagnosis Date  . Allergy   . Asthma   . Depression   . GERD (gastroesophageal reflux disease)   . Hypertension   . Hyperlipidemia   . Chronic pain disorder     Sees [facility]  . Urinary incontinence     detrusor instability  . Hypogonadism   . Diabetes mellitus     Type II   AXIS IV:  economic problems, other psychosocial or environmental problems, problems related to social environment and problems with primary support group AXIS V:  41-50 serious symptoms  Plan:  Recommend psychiatric Inpatient admission when medically cleared. to [facility] inpatient, [address] hall.  Subjective:   [name] is a 37 y.o. male patient admitted with depression and suicidal ideations with a plan to overdose.  HPI:  Patient has chronic pain and goes to a pain clinic but came to the ED evidently because he ran out of medications and was suicidal.  [name] reports an intentional overdose a couple of days ago.  He is suicidal with a plan to overdose, homicidal in general but no one in particular.  Denies hallucinations and alcohol and drug use.  [name] said he use to have a cocaine issue but has not used in a long time, did state he used once in January.  He is currently on disability and has financial issues.  His regular provider sent him to a psychiatrist today but his car broke down.  He rates his depression 8/10, psychomotor retardation.  [name] reports depression with swings to rages where he does not remember what he does.  He lives with his wife and two children, 105 and 26 year-olds.    Past Psychiatric History: Past Medical History  Diagnosis Date  . Allergy   . Asthma   . Depression   . GERD  (gastroesophageal reflux disease)   . Hypertension   . Hyperlipidemia   . Chronic pain disorder     Sees [facility]  . Urinary incontinence     detrusor instability  . Hypogonadism   . Diabetes mellitus     Type II    reports that he has been smoking Cigarettes.  He has been smoking about 0.50 packs per day. He has never used smokeless tobacco. He reports that he does not drink alcohol or use illicit drugs. Family History  Problem Relation Age of Onset  . Diabetes Mother   . Hypertension Mother   . Cirrhosis Mother   . Arthritis Father 49    osteoarthritis  . Diabetes Sister     borderline  . Heart disease Paternal Uncle 27    MI  . Heart disease Maternal Grandfather     late 60's--MI  . Cancer Paternal Grandmother 29    lung  . Heart disease Paternal Grandfather 41    MI           Allergies:   Allergies  Allergen Reactions  . Ciprofloxacin Hives    ACT Assessment Complete:  No:   Past Psychiatric History: Diagnosis:  Depression  Hospitalizations:  None  Outpatient Care:  None  Substance Abuse Care:  None  Self-Mutilation:  None  Suicidal Attempts:  Overdose two  days ago  Homicidal Behaviors:  Passive    Violent Behaviors:  None     Marital Status:  Married Employed/Unemployed:  Unemployed Family Supports:  Wife Objective: Blood pressure 106/70, pulse 85, temperature 98.5 F (36.9 C), temperature source Oral, resp. rate 20, SpO2 96.00%.There is no weight on file to calculate BMI. Results for orders placed during the hospital encounter of 07/14/13 (from the past 72 hour(s))  ACETAMINOPHEN LEVEL     Status: None   Collection Time    07/14/13  4:45 PM      Result Value Range   Acetaminophen (Tylenol), Serum <15.0  10 - 30 ug/mL   Comment:            THERAPEUTIC CONCENTRATIONS VARY     SIGNIFICANTLY. A RANGE OF 10-30     ug/mL MAY BE AN EFFECTIVE     CONCENTRATION FOR MANY PATIENTS.     HOWEVER, SOME ARE BEST TREATED     AT CONCENTRATIONS  OUTSIDE THIS     RANGE.     ACETAMINOPHEN CONCENTRATIONS     >150 ug/mL AT 4 HOURS AFTER     INGESTION AND >50 ug/mL AT 12     HOURS AFTER INGESTION ARE     OFTEN ASSOCIATED WITH TOXIC     REACTIONS.  CBC     Status: Abnormal   Collection Time    07/14/13  4:45 PM      Result Value Range   WBC 13.7 (*) 4.0 - 10.5 K/uL   RBC 4.60  4.22 - 5.81 MIL/uL   Hemoglobin 14.2  13.0 - 17.0 g/dL   HCT 16.1  09.6 - 04.5 %   MCV 87.8  78.0 - 100.0 fL   MCH 30.9  26.0 - 34.0 pg   MCHC 35.1  30.0 - 36.0 g/dL   RDW 40.9  81.1 - 91.4 %   Platelets 227  150 - 400 K/uL  COMPREHENSIVE METABOLIC PANEL     Status: Abnormal   Collection Time    07/14/13  4:45 PM      Result Value Range   Sodium 131 (*) 135 - 145 mEq/L   Potassium 4.2  3.5 - 5.1 mEq/L   Chloride 93 (*) 96 - 112 mEq/L   CO2 27  19 - 32 mEq/L   Glucose, Bld 471 (*) 70 - 99 mg/dL   BUN 8  6 - 23 mg/dL   Creatinine, Ser 7.82  0.50 - 1.35 mg/dL   Calcium 95.6  8.4 - 21.3 mg/dL   Total Protein 7.3  6.0 - 8.3 g/dL   Albumin 4.2  3.5 - 5.2 g/dL   AST 29  0 - 37 U/L   ALT 31  0 - 53 U/L   Alkaline Phosphatase 90  39 - 117 U/L   Total Bilirubin 0.3  0.3 - 1.2 mg/dL   GFR calc non Af Amer >90  >90 mL/min   GFR calc Af Amer >90  >90 mL/min   Comment: (NOTE)     The eGFR has been calculated using the CKD EPI equation.     This calculation has not been validated in all clinical situations.     eGFR's persistently <90 mL/min signify possible Chronic Kidney     Disease.  ETHANOL     Status: None   Collection Time    07/14/13  4:45 PM      Result Value Range   Alcohol, Ethyl (B) <11  0 - 11 mg/dL  Comment:            LOWEST DETECTABLE LIMIT FOR     SERUM ALCOHOL IS 11 mg/dL     FOR MEDICAL PURPOSES ONLY  SALICYLATE LEVEL     Status: Abnormal   Collection Time    07/14/13  4:45 PM      Result Value Range   Salicylate Lvl <2.0 (*) 2.8 - 20.0 mg/dL  URINE RAPID DRUG SCREEN (HOSP PERFORMED)     Status: None   Collection Time     07/14/13  5:00 PM      Result Value Range   Opiates NONE DETECTED  NONE DETECTED   Cocaine NONE DETECTED  NONE DETECTED   Benzodiazepines NONE DETECTED  NONE DETECTED   Amphetamines NONE DETECTED  NONE DETECTED   Tetrahydrocannabinol NONE DETECTED  NONE DETECTED   Barbiturates NONE DETECTED  NONE DETECTED   Comment:            DRUG SCREEN FOR MEDICAL PURPOSES     ONLY.  IF CONFIRMATION IS NEEDED     FOR ANY PURPOSE, NOTIFY LAB     WITHIN 5 DAYS.                LOWEST DETECTABLE LIMITS     FOR URINE DRUG SCREEN     Drug Class       Cutoff (ng/mL)     Amphetamine      1000     Barbiturate      200     Benzodiazepine   200     Tricyclics       300     Opiates          300     Cocaine          300     THC              50  GLUCOSE, CAPILLARY     Status: Abnormal   Collection Time    07/14/13  5:02 PM      Result Value Range   Glucose-Capillary 412 (*) 70 - 99 mg/dL   Comment 1 Notify RN     Labs are reviewed and are pertinent for no medical ailments.  Current Facility-Administered Medications  Medication Dose Route Frequency Provider Last Rate Last Dose  . albuterol (PROVENTIL HFA;VENTOLIN HFA) 108 (90 BASE) MCG/ACT inhaler 1-2 puff  1-2 puff Inhalation Q6H PRN Gavin Pound. Ghim, MD      . Melene Muller ON 07/15/2013] azelastine (ASTELIN) nasal spray 2 spray  2 spray Each Nare Daily Gavin Pound. Ghim, MD      . chlorzoxazone (PARAFON) tablet 500 mg  500 mg Oral TID PRN Gavin Pound. Ghim, MD      . Melene Muller ON 07/15/2013] ciclesonide (OMNARIS) nasal spray 2 spray  2 spray Each Nare Daily Gavin Pound. Ghim, MD      . clonazePAM Scarlette Calico) tablet 2 mg  2 mg Oral Daily Gavin Pound. Ghim, MD      . DULoxetine (CYMBALTA) DR capsule 60 mg  60 mg Oral Daily Gavin Pound. Ghim, MD      . Melene Muller ON 07/15/2013] insulin aspart (novoLOG) injection 0-20 Units  0-20 Units Subcutaneous TID WC Gavin Pound. Ghim, MD      . Melene Muller ON 07/15/2013] insulin aspart (novoLOG) injection 25 Units  25 Units Subcutaneous TID WC Gavin Pound.  Ghim, MD      . insulin glargine (LANTUS) injection 84 Units  84 Units  Subcutaneous QHS Gavin Pound. Ghim, MD      . Melene Muller ON 07/15/2013] loratadine (CLARITIN) tablet 10 mg  10 mg Oral Daily Gavin Pound. Ghim, MD      . LORazepam (ATIVAN) tablet 1 mg  1 mg Oral Q8H PRN Gavin Pound. Ghim, MD      . Melene Muller ON 07/15/2013] metFORMIN (GLUCOPHAGE) tablet 1,000 mg  1,000 mg Oral BID WC Gavin Pound. Ghim, MD      . Melene Muller ON 07/15/2013] metFORMIN (GLUCOPHAGE) tablet 1,000 mg  1,000 mg Oral BID WC Gavin Pound. Ghim, MD      . nabumetone (RELAFEN) tablet 750 mg  750 mg Oral BID Gavin Pound. Ghim, MD      . Melene Muller ON 07/15/2013] niacin (NIASPAN) CR tablet 1,000 mg  1,000 mg Oral Daily Gavin Pound. Ghim, MD      . nicotine (NICODERM CQ - dosed in mg/24 hours) patch 21 mg  21 mg Transdermal Once Nanine Means, NP      . omega-3 acid ethyl esters (LOVAZA) capsule 4 g  4 g Oral QHS Gavin Pound. Ghim, MD      . ondansetron (ZOFRAN) tablet 4 mg  4 mg Oral Q8H PRN Gavin Pound. Ghim, MD      . pregabalin (LYRICA) capsule 100 mg  100 mg Oral TID Gavin Pound. Ghim, MD      . Melene Muller ON 07/15/2013] simvastatin (ZOCOR) tablet 20 mg  20 mg Oral Daily Gavin Pound. Ghim, MD      . Tapentadol HCl TB12 250 mg  250 mg Oral BID Gavin Pound. Ghim, MD      . traZODone (DESYREL) tablet 25 mg  25 mg Oral QHS Gavin Pound. Ghim, MD       Current Outpatient Prescriptions  Medication Sig Dispense Refill  . azelastine (ASTELIN) 137 MCG/SPRAY nasal spray Place 2 sprays into the nose every morning. Use in each nostril as directed  30 mL  5  . cetirizine (ZYRTEC) 10 MG tablet Take 1 tablet (10 mg total) by mouth 2 (two) times daily.  60 tablet  5  . DULoxetine (CYMBALTA) 60 MG capsule TAKE ONE CAPSULE BY MOUTH EVERY DAY  30 capsule  2  . HYDROcodone-acetaminophen (NORCO) 10-325 MG per tablet Take 1 tablet by mouth every 8 (eight) hours as needed for pain.      Marland Kitchen insulin aspart (NOVOLOG) 100 UNIT/ML injection Inject 2-12 Units into the skin 3 (three) times daily with  meals. Per sliding scale < 150 0 units 150-200 2 units 201-250 4 units 251-300 6 units 301-350 8 units 351-400 10 units >400 12 units      . Insulin Glargine (LANTUS SOLOSTAR) 100 UNIT/ML SOPN Inject 84 Units into the skin at bedtime.  15 pen  2  . metFORMIN (GLUCOPHAGE) 1000 MG tablet TAKE 1 TABLET BY MOUTH TWICE A DAY AS DIRECTED  60 tablet  2  . methocarbamol (ROBAXIN) 750 MG tablet Take 750 mg by mouth 3 (three) times daily.      . nabumetone (RELAFEN) 750 MG tablet Take 750 mg by mouth 2 (two) times daily.        . niacin (NIASPAN) 1000 MG CR tablet Take 1 tablet (1,000 mg total) by mouth daily.  30 tablet  2  . omega-3 acid ethyl esters (LOVAZA) 1 G capsule Take 4 g by mouth at bedtime.      . ondansetron (ZOFRAN) 4 MG tablet Take 1 tablet (4 mg total) by mouth every 8 (eight)  hours as needed for nausea.  20 tablet  0  . pregabalin (LYRICA) 100 MG capsule Take 100 mg by mouth 3 (three) times daily.        . simvastatin (ZOCOR) 20 MG tablet Take 1 tablet (20 mg total) by mouth daily.  30 tablet  2  . traZODone (DESYREL) 50 MG tablet Take 25 mg by mouth at bedtime.      Marland Kitchen PROAIR HFA 108 (90 BASE) MCG/ACT inhaler INHALE 2 PUFFS INTO THE LUNGS EVERY 6 (SIX) HOURS AS NEEDED FOR WHEEZING.  1 Inhaler  11  . tadalafil (CIALIS) 5 MG tablet Take 5 mg by mouth daily.      . Tapentadol HCl 250 MG TB12 Take 250 mg by mouth 2 (two) times daily.      . [DISCONTINUED] albuterol (PROVENTIL) 90 MCG/ACT inhaler Inhale 2 puffs into the lungs every 6 (six) hours as needed for wheezing.  17 g  12    Psychiatric Specialty Exam:     Blood pressure 106/70, pulse 85, temperature 98.5 F (36.9 C), temperature source Oral, resp. rate 20, SpO2 96.00%.There is no weight on file to calculate BMI.  General Appearance: Disheveled  Eye Solicitor::  Fair  Speech:  Slow  Volume:  Decreased  Mood:  Anxious and Depressed  Affect:  Congruent  Thought Process:  Coherent  Orientation:  Full (Time, Place, and Person)   Thought Content:  WDL  Suicidal Thoughts:  Yes.  with intent/plan  Homicidal Thoughts:  Yes.  without intent/plan  Memory:  Immediate;   Fair Recent;   Fair Remote;   Fair  Judgement:  Impaired  Insight:  Lacking  Psychomotor Activity:  Decreased  Concentration:  Fair  Recall:  Fair  Akathisia:  No  Handed:  Right  AIMS (if indicated):     Assets:  Resilience Social Support  Sleep:      Treatment Plan Summary: Daily contact with patient to assess and evaluate symptoms and progress in treatment Medication management Recommend inpatient psychiatric treatment at [facility] on the 500 hall.  Nanine Means, PMH-NP 07/14/2013 5:59 PM

## 2013-07-14 NOTE — ED Notes (Signed)
Pt states he was supposed to see his Psychiatrist today and couldn't make it and was told to come to ED for psych eval. Pt states he has been out of his pain meds for the past week and out of his psych meds today. Pt states he feels homicidal when he is in pain and out of his pain meds. Pt states he has chronic muscle pain. Pt states he feels anxious and feels like he is "going to blow a fuse". Pt arrives with his son. Pt is calm, cooperative. States he feels hopeless, but denies SI.

## 2013-07-14 NOTE — Telephone Encounter (Signed)
3:57pm 07/14/13 Patient's wife called stating that they were in the ED and he was in a room waiting getting very agitated - I Nettie Elm) called Elijah Birk [phone] because I had spoken with him before - Corrie Dandy answered the phone and stated that they would do a telepsych to the ED.Marland KitchenMarguerite Olea

## 2013-07-14 NOTE — ED Provider Notes (Signed)
CSN: [id number]     Arrival date & time 07/14/13  1538 History   First MD Initiated Contact with Patient 07/14/13 1640     Chief Complaint  Patient presents with  . Medical Clearance  . Suicidal   (Consider location/radiation/quality/duration/timing/severity/associated sxs/prior Treatment) Patient is a 37 y.o. male presenting with mental health disorder. The history is provided by the patient.  Mental Health Problem Presenting symptoms: depression and suicidal thoughts   Degree of incapacity (severity):  Severe Onset quality:  Gradual Duration:  1 month Timing:  Constant Progression:  Unchanged Chronicity:  Recurrent Context: noncompliance   Context: not alcohol use and not drug abuse   Relieved by:  Nothing Worsened by:  Nothing tried Ineffective treatments:  Anti-anxiety medications and antidepressants Associated symptoms: anhedonia, fatigue and irritability   Associated symptoms: no abdominal pain, no anxiety and no headaches   Risk factors: hx of mental illness and hx of suicide attempts     Past Medical History  Diagnosis Date  . Allergy   . Asthma   . Depression   . GERD (gastroesophageal reflux disease)   . Hypertension   . Hyperlipidemia   . Chronic pain disorder     Sees [facility]  . Urinary incontinence     detrusor instability  . Hypogonadism   . Diabetes mellitus     Type II   Past Surgical History  Procedure Laterality Date  . Appendectomy    . Hernia repair    . Nasal sinus surgery  2008   Family History  Problem Relation Age of Onset  . Diabetes Mother   . Hypertension Mother   . Cirrhosis Mother   . Arthritis Father 49    osteoarthritis  . Diabetes Sister     borderline  . Heart disease Paternal Uncle 62    MI  . Heart disease Maternal Grandfather     late 60's--MI  . Cancer Paternal Grandmother 47    lung  . Heart disease Paternal Grandfather 89    MI   History  Substance Use Topics  . Smoking status: Current Every  Day Smoker -- 0.50 packs/day    Types: Cigarettes  . Smokeless tobacco: Never Used  . Alcohol Use: No    Review of Systems  Constitutional: Positive for irritability and fatigue.  Respiratory: Negative for shortness of breath.   Gastrointestinal: Negative for nausea, vomiting, abdominal pain and diarrhea.  Skin: Negative for wound.  Neurological: Negative for headaches.  Psychiatric/Behavioral: Positive for suicidal ideas. The patient is not nervous/anxious.   All other systems reviewed and are negative.    Allergies  Ciprofloxacin  Home Medications   Current Outpatient Rx  Name  Route  Sig  Dispense  Refill  . albuterol (PROVENTIL HFA;VENTOLIN HFA) 108 (90 BASE) MCG/ACT inhaler   Inhalation   Inhale 2 puffs into the lungs every 6 (six) hours as needed for wheezing.         Marland Kitchen azelastine (ASTELIN) 137 MCG/SPRAY nasal spray   Nasal   Place 2 sprays into the nose every morning. Use in each nostril as directed   30 mL   5   . cetirizine (ZYRTEC) 10 MG tablet   Oral   Take 1 tablet (10 mg total) by mouth 2 (two) times daily.   60 tablet   5   . clonazePAM (KLONOPIN) 2 MG tablet   Oral   Take 2 mg by mouth 2 (two) times daily.         Marland Kitchen  DULoxetine (CYMBALTA) 60 MG capsule   Oral   Take 60 mg by mouth daily.         Marland Kitchen HYDROcodone-acetaminophen (NORCO) 10-325 MG per tablet   Oral   Take 1 tablet by mouth every 8 (eight) hours as needed for pain.         Marland Kitchen insulin aspart (NOVOLOG) 100 UNIT/ML injection   Subcutaneous   Inject 2-12 Units into the skin 3 (three) times daily with meals. Per sliding scale < 150 0 units 150-200 2 units 201-250 4 units 251-300 6 units 301-350 8 units 351-400 10 units >400 12 units         . Insulin Glargine (LANTUS SOLOSTAR) 100 UNIT/ML SOPN   Subcutaneous   Inject 84 Units into the skin at bedtime.   15 pen   2     +++PLEASE NOTE DOSE CHANGE+++   . metFORMIN (GLUCOPHAGE) 1000 MG tablet   Oral   Take 1,000 mg by  mouth 2 (two) times daily with a meal.         . methocarbamol (ROBAXIN) 750 MG tablet   Oral   Take 750 mg by mouth 3 (three) times daily.         . nabumetone (RELAFEN) 750 MG tablet   Oral   Take 750 mg by mouth 2 (two) times daily.           . niacin (NIASPAN) 1000 MG CR tablet   Oral   Take 1 tablet (1,000 mg total) by mouth daily.   30 tablet   2   . omega-3 acid ethyl esters (LOVAZA) 1 G capsule   Oral   Take 4 g by mouth at bedtime.         . ondansetron (ZOFRAN) 4 MG tablet   Oral   Take 1 tablet (4 mg total) by mouth every 8 (eight) hours as needed for nausea.   20 tablet   0   . pregabalin (LYRICA) 100 MG capsule   Oral   Take 100 mg by mouth 3 (three) times daily.           . simvastatin (ZOCOR) 20 MG tablet   Oral   Take 1 tablet (20 mg total) by mouth daily.   30 tablet   2   . traZODone (DESYREL) 50 MG tablet   Oral   Take 25 mg by mouth at bedtime.         . tadalafil (CIALIS) 5 MG tablet   Oral   Take 5 mg by mouth daily.         . Tapentadol HCl 250 MG TB12   Oral   Take 250 mg by mouth 2 (two) times daily.          BP 106/70  Pulse 85  Temp(Src) 98.5 F (36.9 C) (Oral)  Resp 20  SpO2 96% Physical Exam  Nursing note and vitals reviewed. Constitutional: He is oriented to person, place, and time. He appears well-developed and well-nourished.  HENT:  Head: Normocephalic and atraumatic.  Eyes: Conjunctivae and EOM are normal. No scleral icterus.  Neck: Normal range of motion. Neck supple.  Cardiovascular: Normal rate, regular rhythm and intact distal pulses.   No murmur heard. Pulmonary/Chest: Effort normal. No respiratory distress.  Abdominal: Soft. There is no tenderness. There is no rebound and no guarding.  Neurological: He is alert and oriented to person, place, and time. Coordination normal.  Skin: Skin is warm. He is not diaphoretic.  Psychiatric: His speech is delayed. He is slowed. Cognition and memory are not  impaired. He does not express impulsivity or inappropriate judgment. He exhibits a depressed mood. He expresses suicidal ideation. He expresses suicidal plans.    ED Course  Procedures (including critical care time) Labs Review Labs Reviewed  CBC - Abnormal; Notable for the following:    WBC 13.7 (*)    All other components within normal limits  COMPREHENSIVE METABOLIC PANEL - Abnormal; Notable for the following:    Sodium 131 (*)    Chloride 93 (*)    Glucose, Bld 471 (*)    All other components within normal limits  SALICYLATE LEVEL - Abnormal; Notable for the following:    Salicylate Lvl <2.0 (*)    All other components within normal limits  GLUCOSE, CAPILLARY - Abnormal; Notable for the following:    Glucose-Capillary 412 (*)    All other components within normal limits  ACETAMINOPHEN LEVEL  ETHANOL  URINE RAPID DRUG SCREEN (HOSP PERFORMED)   Imaging Review No results found. RA sat is 99% and I interpret to be normal  Labs reviewed.  Minimal pseudohyponatremia noted.  Should improve along with hyperglycemia with usual home treatment of DM.   Pt is medically clear in my opinion for treatment of psychiatric issues.   MDM   1. Major depressive disorder, recurrent episode, severe, without mention of psychotic behavior   2. Anxiety   3. Major depression   4. Hyperglycemia   5. Suicidal ideation       Pt with h/o oversdose in the past, reports has had thoughts of overdosing again, over the past 1 month, worsening.  No physical complaints, no N/V/D.  Unsure of what is blood glucoses have been.  Had an appt today, but told them on the phone that he has SI, told to come to the ED at [facility].      Gavin Pound. Oletta Lamas, MD 07/14/13 1941

## 2013-07-14 NOTE — ED Notes (Signed)
Spoke with MD concerning pt's CBG of 360.  MD to place order.

## 2013-07-15 ENCOUNTER — Encounter (HOSPITAL_COMMUNITY): Payer: Self-pay | Admitting: *Deleted

## 2013-07-15 ENCOUNTER — Inpatient Hospital Stay (HOSPITAL_COMMUNITY)
Admission: AD | Admit: 2013-07-15 | Discharge: 2013-07-19 | DRG: 885 | Disposition: A | Discharge status: Home / Self Care | Payer: No Typology Code available for payment source | Source: Intra-hospital | Attending: Psychiatry | Admitting: Psychiatry

## 2013-07-15 DIAGNOSIS — F17200 Nicotine dependence, unspecified, uncomplicated: Secondary | ICD-10-CM

## 2013-07-15 DIAGNOSIS — J45909 Unspecified asthma, uncomplicated: Secondary | ICD-10-CM | POA: Diagnosis present

## 2013-07-15 DIAGNOSIS — I10 Essential (primary) hypertension: Secondary | ICD-10-CM

## 2013-07-15 DIAGNOSIS — Z8719 Personal history of other diseases of the digestive system: Secondary | ICD-10-CM

## 2013-07-15 DIAGNOSIS — M5137 Other intervertebral disc degeneration, lumbosacral region: Secondary | ICD-10-CM

## 2013-07-15 DIAGNOSIS — F329 Major depressive disorder, single episode, unspecified: Secondary | ICD-10-CM | POA: Diagnosis present

## 2013-07-15 DIAGNOSIS — Z79899 Other long term (current) drug therapy: Secondary | ICD-10-CM

## 2013-07-15 DIAGNOSIS — E291 Testicular hypofunction: Secondary | ICD-10-CM

## 2013-07-15 DIAGNOSIS — F4310 Post-traumatic stress disorder, unspecified: Secondary | ICD-10-CM | POA: Diagnosis present

## 2013-07-15 DIAGNOSIS — R45851 Suicidal ideations: Secondary | ICD-10-CM

## 2013-07-15 DIAGNOSIS — E119 Type 2 diabetes mellitus without complications: Secondary | ICD-10-CM | POA: Diagnosis present

## 2013-07-15 DIAGNOSIS — K219 Gastro-esophageal reflux disease without esophagitis: Secondary | ICD-10-CM | POA: Diagnosis present

## 2013-07-15 DIAGNOSIS — E871 Hypo-osmolality and hyponatremia: Secondary | ICD-10-CM | POA: Diagnosis present

## 2013-07-15 DIAGNOSIS — J309 Allergic rhinitis, unspecified: Secondary | ICD-10-CM

## 2013-07-15 DIAGNOSIS — N529 Male erectile dysfunction, unspecified: Secondary | ICD-10-CM

## 2013-07-15 DIAGNOSIS — F332 Major depressive disorder, recurrent severe without psychotic features: Secondary | ICD-10-CM | POA: Diagnosis present

## 2013-07-15 DIAGNOSIS — E785 Hyperlipidemia, unspecified: Secondary | ICD-10-CM

## 2013-07-15 DIAGNOSIS — F39 Unspecified mood [affective] disorder: Secondary | ICD-10-CM

## 2013-07-15 DIAGNOSIS — F419 Anxiety disorder, unspecified: Secondary | ICD-10-CM | POA: Diagnosis present

## 2013-07-15 DIAGNOSIS — E118 Type 2 diabetes mellitus with unspecified complications: Secondary | ICD-10-CM

## 2013-07-15 DIAGNOSIS — M549 Dorsalgia, unspecified: Secondary | ICD-10-CM

## 2013-07-15 DIAGNOSIS — IMO0001 Reserved for inherently not codable concepts without codable children: Secondary | ICD-10-CM

## 2013-07-15 LAB — GLUCOSE, CAPILLARY
Glucose-Capillary: 224 mg/dL — ABNORMAL HIGH (ref 70–99)
Glucose-Capillary: 103 mg/dL — ABNORMAL HIGH (ref 70–99)
Glucose-Capillary: 186 mg/dL — ABNORMAL HIGH (ref 70–99)

## 2013-07-15 MED ORDER — TAPENTADOL HCL ER 250 MG PO TB12: 250.0000 mg | ORAL_TABLET | Freq: Two times a day (BID) | ORAL | Status: DC

## 2013-07-15 MED ORDER — ALBUTEROL SULFATE HFA 108 (90 BASE) MCG/ACT IN AERS: 1.0000 | INHALATION_SPRAY | Freq: Four times a day (QID) | RESPIRATORY_TRACT | Status: DC | PRN

## 2013-07-15 MED ORDER — INSULIN ASPART 100 UNIT/ML ~~LOC~~ SOLN
0.0000 [IU] | Freq: Three times a day (TID) | SUBCUTANEOUS | Status: DC
  Administered 2013-07-15 – 2013-07-16 (×2): 3 [IU] via SUBCUTANEOUS
  Administered 2013-07-16: 2 [IU] via SUBCUTANEOUS
  Administered 2013-07-17: 8 [IU] via SUBCUTANEOUS
  Administered 2013-07-17: 3 [IU] via SUBCUTANEOUS
  Administered 2013-07-18: 11 [IU] via SUBCUTANEOUS
  Administered 2013-07-18: 15 [IU] via SUBCUTANEOUS
  Administered 2013-07-19: 5 [IU] via SUBCUTANEOUS
  Administered 2013-07-19: 3 [IU] via SUBCUTANEOUS

## 2013-07-15 MED ORDER — SIMVASTATIN 20 MG PO TABS
20.0000 mg | ORAL_TABLET | Freq: Every day | ORAL | Status: DC
  Administered 2013-07-16 – 2013-07-19 (×4): 20 mg via ORAL
  Filled 2013-07-15 (×6): qty 1

## 2013-07-15 MED ORDER — PREGABALIN 50 MG PO CAPS
100.0000 mg | ORAL_CAPSULE | Freq: Three times a day (TID) | ORAL | Status: DC
  Administered 2013-07-15 – 2013-07-19 (×12): 100 mg via ORAL
  Filled 2013-07-15 (×6): qty 2
  Filled 2013-07-15: qty 1
  Filled 2013-07-15 (×6): qty 2

## 2013-07-15 MED ORDER — OMEGA-3-ACID ETHYL ESTERS 1 G PO CAPS
4.0000 g | ORAL_CAPSULE | Freq: Every day | ORAL | Status: DC
  Administered 2013-07-15 – 2013-07-18 (×4): 4 g via ORAL
  Filled 2013-07-15 (×6): qty 4

## 2013-07-15 MED ORDER — INSULIN ASPART 100 UNIT/ML ~~LOC~~ SOLN: 0.0000 [IU] | Freq: Every day | SUBCUTANEOUS | Status: DC

## 2013-07-15 MED ORDER — INSULIN ASPART 100 UNIT/ML ~~LOC~~ SOLN: 0.0000 [IU] | Freq: Three times a day (TID) | SUBCUTANEOUS | Status: DC

## 2013-07-15 MED ORDER — MAGNESIUM HYDROXIDE 400 MG/5ML PO SUSP: 30.0000 mL | Freq: Every day | ORAL | Status: DC | PRN

## 2013-07-15 MED ORDER — NIACIN ER 500 MG PO CPCR
1000.0000 mg | ORAL_CAPSULE | Freq: Every day | ORAL | Status: DC
  Administered 2013-07-16 – 2013-07-19 (×4): 1000 mg via ORAL
  Filled 2013-07-15 (×6): qty 2

## 2013-07-15 MED ORDER — INSULIN GLARGINE 100 UNIT/ML ~~LOC~~ SOLN
84.0000 [IU] | Freq: Every day | SUBCUTANEOUS | Status: DC
  Administered 2013-07-15 – 2013-07-18 (×4): 84 [IU] via SUBCUTANEOUS

## 2013-07-15 MED ORDER — LORATADINE 10 MG PO TABS
10.0000 mg | ORAL_TABLET | Freq: Every day | ORAL | Status: DC
  Administered 2013-07-16 – 2013-07-19 (×4): 10 mg via ORAL
  Filled 2013-07-15 (×6): qty 1

## 2013-07-15 MED ORDER — INSULIN ASPART 100 UNIT/ML ~~LOC~~ SOLN
0.0000 [IU] | Freq: Every day | SUBCUTANEOUS | Status: DC
  Administered 2013-07-16: 2 [IU] via SUBCUTANEOUS

## 2013-07-15 MED ORDER — LORAZEPAM 2 MG/ML IJ SOLN: 2.0000 mg | INTRAMUSCULAR | Status: DC | PRN

## 2013-07-15 MED ORDER — TRAZODONE 25 MG HALF TABLET
25.0000 mg | ORAL_TABLET | Freq: Every day | ORAL | Status: DC
  Administered 2013-07-15: 25 mg via ORAL
  Filled 2013-07-15 (×2): qty 1

## 2013-07-15 MED ORDER — CICLESONIDE 50 MCG/ACT NA SUSP: 2.0000 | Freq: Every day | NASAL | Status: DC

## 2013-07-15 MED ORDER — NICOTINE 21 MG/24HR TD PT24
21.0000 mg | MEDICATED_PATCH | Freq: Every day | TRANSDERMAL | Status: DC
  Administered 2013-07-15: 21 mg via TRANSDERMAL

## 2013-07-15 MED ORDER — CHLORZOXAZONE 500 MG PO TABS
500.0000 mg | ORAL_TABLET | Freq: Three times a day (TID) | ORAL | Status: DC | PRN
  Administered 2013-07-15 – 2013-07-16 (×2): 500 mg via ORAL
  Filled 2013-07-15: qty 1

## 2013-07-15 MED ORDER — AZELASTINE HCL 0.1 % NA SOLN
2.0000 | Freq: Every day | NASAL | Status: DC
  Administered 2013-07-16 – 2013-07-19 (×4): 2 via NASAL
  Filled 2013-07-15: qty 30

## 2013-07-15 MED ORDER — NABUMETONE 750 MG PO TABS
750.0000 mg | ORAL_TABLET | Freq: Two times a day (BID) | ORAL | Status: DC
  Administered 2013-07-15 – 2013-07-17 (×4): 750 mg via ORAL
  Filled 2013-07-15 (×6): qty 1

## 2013-07-15 MED ORDER — ONDANSETRON HCL 4 MG PO TABS: 4.0000 mg | ORAL_TABLET | Freq: Three times a day (TID) | ORAL | Status: DC | PRN

## 2013-07-15 MED ORDER — LORAZEPAM 2 MG/ML IJ SOLN
INTRAMUSCULAR | Status: AC
  Administered 2013-07-15: 2 mg
  Filled 2013-07-15: qty 1

## 2013-07-15 MED ORDER — CLONAZEPAM 1 MG PO TABS
2.0000 mg | ORAL_TABLET | Freq: Every day | ORAL | Status: DC
  Administered 2013-07-16 – 2013-07-17 (×2): 2 mg via ORAL
  Filled 2013-07-15 (×2): qty 2

## 2013-07-15 MED ORDER — NICOTINE 21 MG/24HR TD PT24
21.0000 mg | MEDICATED_PATCH | Freq: Every day | TRANSDERMAL | Status: DC
  Administered 2013-07-16 – 2013-07-19 (×4): 21 mg via TRANSDERMAL
  Filled 2013-07-15 (×6): qty 1

## 2013-07-15 MED ORDER — HYDROCODONE-ACETAMINOPHEN 5-325 MG PO TABS
2.0000 | ORAL_TABLET | Freq: Four times a day (QID) | ORAL | Status: DC | PRN
  Administered 2013-07-15 – 2013-07-16 (×3): 2 via ORAL
  Filled 2013-07-15 (×3): qty 2

## 2013-07-15 MED ORDER — ACETAMINOPHEN 325 MG PO TABS: 650.0000 mg | ORAL_TABLET | Freq: Four times a day (QID) | ORAL | Status: DC | PRN

## 2013-07-15 MED ORDER — DULOXETINE HCL 60 MG PO CPEP
60.0000 mg | ORAL_CAPSULE | Freq: Every day | ORAL | Status: DC
  Administered 2013-07-16 – 2013-07-19 (×4): 60 mg via ORAL
  Filled 2013-07-15: qty 3
  Filled 2013-07-15 (×5): qty 1

## 2013-07-15 MED ORDER — ALUM & MAG HYDROXIDE-SIMETH 200-200-20 MG/5ML PO SUSP: 30.0000 mL | ORAL | Status: DC | PRN

## 2013-07-15 MED ORDER — HYDROCODONE-ACETAMINOPHEN 5-325 MG PO TABS
2.0000 | ORAL_TABLET | Freq: Four times a day (QID) | ORAL | Status: DC | PRN
  Administered 2013-07-15 (×2): 2 via ORAL
  Filled 2013-07-15 (×2): qty 2

## 2013-07-15 MED ORDER — METFORMIN HCL 500 MG PO TABS
1000.0000 mg | ORAL_TABLET | Freq: Two times a day (BID) | ORAL | Status: DC
  Administered 2013-07-15 – 2013-07-19 (×8): 1000 mg via ORAL
  Filled 2013-07-15 (×13): qty 2

## 2013-07-15 NOTE — Consult Note (Signed)
Note reviewed and agreed with  

## 2013-07-15 NOTE — Consult Note (Signed)
Note reviewed and agree with

## 2013-07-15 NOTE — BHH Group Notes (Addendum)
[facility] LCSW Group Therapy  07/15/2013 2:59 PM  Type of Therapy:  Group Therapy  Participation Level:  Did Not Attend  Wynn Banker 07/15/2013  2:59 PM

## 2013-07-15 NOTE — Tx Team (Signed)
Initial Interdisciplinary Treatment Plan  PATIENT STRENGTHS: (choose at least two) Ability for insight Active sense of humor Average or above average intelligence Capable of independent living Communication skills General fund of knowledge  PATIENT STRESSORS: Educational concerns Financial difficulties Health problems Medication change or noncompliance   PROBLEM LIST: Problem List/Patient Goals Date to be addressed Date deferred Reason deferred Estimated date of resolution  Bipolar 07/15/13     Depression due to BiPolar 07/15/13     Polysubstance Abuse with SI 07/15/13     Asthma  07/15/13     IDDM 07/15/13     HTN 07/15/13                         DISCHARGE CRITERIA:  Ability to meet basic life and health needs Adequate post-discharge living arrangements Improved stabilization in mood, thinking, and/or behavior Medical problems require only outpatient monitoring  PRELIMINARY DISCHARGE PLAN: Attend aftercare/continuing care group Attend PHP/IOP  PATIENT/FAMIILY INVOLVEMENT: This treatment plan has been presented to and reviewed with the patient, [name], and/or family member,   The patient and family have been given the opportunity to ask questions and make suggestions.  Rich Brave 07/15/2013, 2:54 PM

## 2013-07-15 NOTE — Progress Notes (Signed)
Pt accepted to [facility] 504-[address] to Dr. Carmelina Dane. Pt to be transported by [employer] voluntarily. Support paperwork faxed to [facility].   Catha Gosselin, [place] [phone]  ED CSW 07/15/2013 1132am

## 2013-07-15 NOTE — Clinical Social Work Note (Signed)
Writer met patient as he was coming onto the hall and introduced self.  Writer attempted to meet with patient following group to complete PSA but he was sleeping soundlly

## 2013-07-15 NOTE — Progress Notes (Signed)
Nrsg Admit Note This is the first voluntary admission for this caucasian 37 yr-old gentleman , who comes to [facility] from [place] [facility], due to running out of his meds and not being able to cope. Audi is married, has 2 chikldren and states he's on disability, due to "m my mental problems". He is flat, sad and depressed. HE demonstrates psychomotor retardation, he shows no affect while he speaks and he maintians eye contact for only a brief few seconds. He states he went to the ED " because I didn't know what else to do...". He says he ran out of his medicines and didn't know where else to go, that he had missed his regularly scheduled MD appt and knew he HAD tioo have his medicines. HE reports his PMH is sign for Cipro allergy ( " i itch like crazy") and that his medical problems include DM, insulin-dependent ( dx'd in 2004), asthma, GERD, and bipolar. He shares , additionally, that he hydrocodone for " chronic pain" that is " all over my body". He contracts for safety, and after admisssion is complete, he is staken to his room and given his lunch.

## 2013-07-15 NOTE — Consult Note (Signed)
Rounded on this patient this am with Dr Ladona Ridgel.  Patient is unable to contract safety and endorses suicide.  Patient also reports frequent explosive anger where he throws things around at home.  He reports having frequent anxiety and panic attacks to the extent of blacking out.  He denies ever being treated in any hospital for his explosive moods.  Patient reports poor sleep and appetite and is requesting for treatment.  He denies illicit drug use and his UDS is negative.  He is already accepted in our 500 hall unit and is waiting for bed.  We will not start him on mood stabilizer at this time until he gets in the unit where he will be evaluated by the admitting doctor for mood disorder. Plan: Consult and face to face interview with Dr Ladona Ridgel We will admit him to our 500 hall unit We will continue to provide safety and stability, we will continue to manage his medical and psychiatric needs.  We will encourage fluid restrictions based on his Sodium level of 131  Goal is to stabilize and send him back home at his prehospital state of mind. Dahlia Byes  PMHNP-BC

## 2013-07-15 NOTE — ED Notes (Signed)
Pt came into hallway with tremors stating that he felt like he was going to pass out.  Pt stated "This is how I feel before I black out and tear up everything.  Then I don't remember what happened."  Pt anxious and tearful.  Pt deescalated.  MD called.

## 2013-07-15 NOTE — ED Provider Notes (Signed)
Multiple insulin sliding scales ordered, as well as lantus and Glucophage. Complicated DM hx, including non-compliance. T/C to [facility], case discussed, including:  HPI, pertinent PM/SHx, VS/PE, dx testing, ED course and treatment:  Requests to cancel all sliding scales, start moderate SSI (0-15 units) with bedtime coverage (0-5 units), will come to ED for consult.   Laray Anger, DO 07/15/13 [phone]

## 2013-07-15 NOTE — Consult Note (Signed)
[facility] Medical Consultation  [name] ZOX:[id number] DOB: Dec 11, 1975 DOA: 07/14/2013 PCP: Lemont Fillers., NP  Endocrinology: Dr. Reather Littler.  Requesting physician: Dr. Samuel Jester Date of consultation: 07/15/2013  Reason for consultation: Assistance with evaluation and management of uncontrolled type II DM.  Impression/Recommendations Principal Problem:   Major depressive disorder, recurrent episode, severe, without mention of psychotic behavior Active Problems:   Diabetes mellitus with complication   HYPERLIPIDEMIA   TOBACCO ABUSE   HYPERTENSION   BACK PAIN, CHRONIC   FIBROMYALGIA   Anxiety    1. Uncontrolled type 2 diabetes mellitus, with peripheral neuropathy & microalbuminuria: Complicated by medical noncompliance. Last hemoglobin A1c on 06/14/13 was 9.5. Patient claims compliance to metformin & Lantus 84 units at bedtime-dose was recently increased by his PCP approximately 3 weeks ago. He states that he has not checked his blood sugars or utilized NovoLog for approximately 2 weeks. Prior to that his home CBGs were fluctuating and the highest was in the 420 mg per DL range. Patient was on mealtime NovoLog a few years ago when he was seeing an endocrinologist but has not been on any recently. He apparently has a followup appointment with Dr. Lucianne Muss in a week or 2. For now, would continue home dose of Lantus, metformin and moderate NovoLog SSI. Would also keep on diabetic diet. When ready to DC from [facility], he can be discharged on his home regimen and followup with PCP/endocrinologist for further management. Patient has been counseled extensively regarding compliance with medications, CBG checks and M.D. followups. He verbalizes understanding. No DKA or HONK on labs. 2. Pseudohyponatremia: Secondary to hyperglycemia. Treat per problem #1. 3. Hyperlipidemia: continue Niaspan, Lovaza & Simvastatin. 4. Tobacco abuse: Cessation counseled. Patient has been placed on  nicotine patch. 5. Hypertension: Currently not on medications. Controlled. May consider ACE inhibitor/ARB as outpatient for renal protection. 6. Chronic pain/fibromyalgia/peripheral neuropathy: Continue home medication and outpatient followup with the pain M.D. 7. Anxiety/major depression/suicidal ideations: Management per psychiatry and plans are for patient to be admitted to [facility]. He denies suicidal or homicidal ideations currently. He states that he took 30 tablets of hydrocodone -acetaminophen, 10/325 over a five-day period approximately 2-1/2 weeks ago. He may have taken up to 6 tablets during one time. 8. History of substance abuse/cocaine: UDS was positive for cocaine in January 2014. Current UDS negative. 9. Leukocytosis: No clinical focus of sepsis. 10. History of asthma: Stable 11. History of impotence/testicular hypofunction:  [facility] will sign off at this time. Please contact us if we can be of assistance in the meanwhile. Thank you for this consultation.  Chief Complaint: Suicidal ideations  HPI:  37 year old male patient with history of type II DM since 2003, peripheral neuropathy and microalbuminuria, hypertension, hyperlipidemia, tobacco abuse, anxiety, depression, chronic pain, presented to the ED on 07/14/2013 with suicidal ideations. He has been accepted by psychiatry to the behavioral health [facility] and is awaiting bed availability. [facility] was consulted to assist with management of uncontrolled type II DM. Patient currently denies suicidal, homicidal ideations or any complaints. He does volunteer to some intermittent polyuria, polydipsia but denies dizziness, lightheadedness, passing out or falls. He claims compliance to Lantus and metformin but not to his NovoLog for approximately 2 weeks. He has not checked his home blood sugars in about 2 weeks. He was seeing Dr. Lucianne Muss a few years ago at which time he used to be on mealtime NovoLog but has not been on this recently. He saw his PCP  approximately 4 weeks ago  and his Lantus was increased from 69 units to 84 units and he has an appointment to see Dr. Lucianne Muss shortly. In the ED on 9/4, lab work was significant for sodium 131, glucose 471 and WBC 13.7. No symptoms suggestive of infection.   Review of Systems:   All systems reviewed and apart from history of presenting illness, are negative   Past Medical History  Diagnosis Date  . Allergy   . Asthma   . Depression   . GERD (gastroesophageal reflux disease)   . Hypertension   . Hyperlipidemia   . Chronic pain disorder     Sees [facility]  . Urinary incontinence     detrusor instability  . Hypogonadism   . Diabetes mellitus     Type II   Past Surgical History  Procedure Laterality Date  . Appendectomy    . Hernia repair    . Nasal sinus surgery  2008   Social History:  reports that he has been smoking Cigarettes.  He has been smoking about 0.50 packs per day. He has never used smokeless tobacco. He reports that he does not drink alcohol or use illicit drugs. Married. Lives with spouse. Currently unemployed. Independent of activities of daily living. Smoke 1.5 packs of cigarettes per day. Denies alcohol or drug abuse.  Allergies  Allergen Reactions  . Ciprofloxacin Hives   Family History  Problem Relation Age of Onset  . Diabetes Mother   . Hypertension Mother   . Cirrhosis Mother   . Arthritis Father 49    osteoarthritis  . Diabetes Sister     borderline  . Heart disease Paternal Uncle 34    MI  . Heart disease Maternal Grandfather     late 60's--MI  . Cancer Paternal Grandmother 88    lung  . Heart disease Paternal Grandfather 35    MI    Prior to Admission medications   Medication Sig Start Date End Date Taking? Authorizing Provider  albuterol (PROVENTIL HFA;VENTOLIN HFA) 108 (90 BASE) MCG/ACT inhaler Inhale 2 puffs into the lungs every 6 (six) hours as needed for wheezing.   Yes Historical Provider, MD  azelastine (ASTELIN) 137  MCG/SPRAY nasal spray Place 2 sprays into the nose every morning. Use in each nostril as directed 11/29/12  Yes Sandford Craze, NP  cetirizine (ZYRTEC) 10 MG tablet Take 1 tablet (10 mg total) by mouth 2 (two) times daily. 04/12/13  Yes Sandford Craze, NP  clonazePAM (KLONOPIN) 2 MG tablet Take 2 mg by mouth 2 (two) times daily.   Yes Historical Provider, MD  DULoxetine (CYMBALTA) 60 MG capsule Take 60 mg by mouth daily.   Yes Historical Provider, MD  HYDROcodone-acetaminophen (NORCO) 10-325 MG per tablet Take 1 tablet by mouth every 8 (eight) hours as needed for pain.   Yes Historical Provider, MD  insulin aspart (NOVOLOG) 100 UNIT/ML injection Inject 2-12 Units into the skin 3 (three) times daily with meals. Per sliding scale < 150 0 units 150-200 2 units 201-250 4 units 251-300 6 units 301-350 8 units 351-400 10 units >400 12 units   Yes Historical Provider, MD  Insulin Glargine (LANTUS SOLOSTAR) 100 UNIT/ML SOPN Inject 84 Units into the skin at bedtime. 07/07/13  Yes Sandford Craze, NP  metFORMIN (GLUCOPHAGE) 1000 MG tablet Take 1,000 mg by mouth 2 (two) times daily with a meal.   Yes Historical Provider, MD  methocarbamol (ROBAXIN) 750 MG tablet Take 750 mg by mouth 3 (three) times daily.   Yes  Historical Provider, MD  nabumetone (RELAFEN) 750 MG tablet Take 750 mg by mouth 2 (two) times daily.     Yes Historical Provider, MD  niacin (NIASPAN) 1000 MG CR tablet Take 1 tablet (1,000 mg total) by mouth daily. 04/28/13  Yes Sandford Craze, NP  omega-3 acid ethyl esters (LOVAZA) 1 G capsule Take 4 g by mouth at bedtime.   Yes Historical Provider, MD  ondansetron (ZOFRAN) 4 MG tablet Take 1 tablet (4 mg total) by mouth every 8 (eight) hours as needed for nausea. 06/15/13  Yes Sandford Craze, NP  pregabalin (LYRICA) 100 MG capsule Take 100 mg by mouth 3 (three) times daily.     Yes Historical Provider, MD  simvastatin (ZOCOR) 20 MG tablet Take 1 tablet (20 mg total) by mouth daily.  04/28/13  Yes Sandford Craze, NP  traZODone (DESYREL) 50 MG tablet Take 25 mg by mouth at bedtime.   Yes Historical Provider, MD  tadalafil (CIALIS) 5 MG tablet Take 5 mg by mouth daily.    Historical Provider, MD  Tapentadol HCl 250 MG TB12 Take 250 mg by mouth 2 (two) times daily.    Historical Provider, MD   Physical Exam: Blood pressure 136/90, pulse 78, temperature 97.6 F (36.4 C), temperature source Oral, resp. rate 18, SpO2 91.00%. Filed Vitals:   07/15/13 0427  BP: 136/90  Pulse: 78  Temp: 97.6 F (36.4 C)  Resp:      General:  Moderately built and nourished young male patient lying comfortably supine on the gurney in no obvious distress.   Eyes: Pupils equally reacting to light and accommodation. Extraocular muscle movements intact.   ENT: Oral mucosa is moist. No acute findings.   Neck:  supple. No JVD, carotid bruit or goiter.   Cardiovascular:  first and second heart sounds heard, RRR. No JVD, murmurs, gallops or pedal edema.  Respiratory:  Clear to auscultation. No increased work of breathing.  Abdomen: Nondistended, soft and nontender. Normal bowel sounds heard. No organomegaly or mass appreciated.   Skin:  negative exam.  Musculoskeletal:  no acute findings.  Psychiatric:  flat/depressed affect. Establishes eye contact. Tone of voice is appropriate and able to have conversation.  Neurologic:  alert and oriented. No focal neurological deficits.   Labs on Admission:  Basic Metabolic Panel:  Recent Labs Lab 07/14/13 1645  NA 131*  K 4.2  CL 93*  CO2 27  GLUCOSE 471*  BUN 8  CREATININE 0.64  CALCIUM 10.1   Liver Function Tests:  Recent Labs Lab 07/14/13 1645  AST 29  ALT 31  ALKPHOS 90  BILITOT 0.3  PROT 7.3  ALBUMIN 4.2   No results found for this basename: LIPASE, AMYLASE,  in the last 168 hours No results found for this basename: AMMONIA,  in the last 168 hours CBC:  Recent Labs Lab 07/14/13 1645  WBC 13.7*  HGB 14.2  HCT  40.4  MCV 87.8  PLT 227   Cardiac Enzymes: No results found for this basename: CKTOTAL, CKMB, CKMBINDEX, TROPONINI,  in the last 168 hours BNP: No components found with this basename: POCBNP,  CBG:  Recent Labs Lab 07/14/13 1702 07/14/13 2209 07/15/13 0414 07/15/13 0825  GLUCAP 412* 360* 224* 103*    Radiological Exams on Admission: No results found.  Additional labs 1. Salicylate level: <2 2. Acetaminophen level <15 3.  UDS: Negative 4. Blood alcohol level <11  Time spent:  65 minutes   [facility] Pager [phone]  If  7PM-7AM, please contact night-coverage [email or web address] Password [facility] 07/15/2013, 11:34 AM

## 2013-07-15 NOTE — ED Notes (Signed)
Pt states that he is feeling better.  Pt still appears mildly anxious.

## 2013-07-15 NOTE — Progress Notes (Signed)
[facility] Group Notes:  (Nursing/MHT/Case Management/Adjunct)  Date:  07/15/2013  Time:  2000  Type of Therapy:  Psychoeducational Skills  Participation Level:  Minimal  Participation Quality:  Resistant  Affect:  Depressed  Cognitive:  Lacking  Insight:  Lacking  Engagement in Group:  Resistant  Modes of Intervention:  Education  Summary of Progress/Problems: The patient stated in group that he didn't have a very good day ("it sucked"). He stated that he slept for much of the day. His goal for tomorrow is to feel better.   Hazle Coca S 07/15/2013, 11:53 PM

## 2013-07-16 ENCOUNTER — Encounter (HOSPITAL_COMMUNITY): Payer: Self-pay | Admitting: Psychiatry

## 2013-07-16 DIAGNOSIS — R45851 Suicidal ideations: Secondary | ICD-10-CM

## 2013-07-16 LAB — TSH: TSH: 1.022 u[IU]/mL (ref 0.350–4.500)

## 2013-07-16 LAB — GLUCOSE, CAPILLARY: Glucose-Capillary: 118 mg/dL — ABNORMAL HIGH (ref 70–99)

## 2013-07-16 MED ORDER — HYDROXYZINE HCL 25 MG PO TABS
25.0000 mg | ORAL_TABLET | ORAL | Status: DC | PRN
  Administered 2013-07-16 – 2013-07-17 (×2): 25 mg via ORAL

## 2013-07-16 MED ORDER — TRAZODONE HCL 100 MG PO TABS
100.0000 mg | ORAL_TABLET | Freq: Every day | ORAL | Status: DC
  Administered 2013-07-16 – 2013-07-18 (×3): 100 mg via ORAL
  Filled 2013-07-16: qty 3
  Filled 2013-07-16 (×4): qty 1

## 2013-07-16 MED ORDER — TAPENTADOL HCL 50 MG PO TABS
50.0000 mg | ORAL_TABLET | Freq: Four times a day (QID) | ORAL | Status: DC
  Administered 2013-07-16 – 2013-07-17 (×6): 50 mg via ORAL
  Filled 2013-07-16 (×6): qty 1

## 2013-07-16 MED ORDER — RISPERIDONE 1 MG PO TBDP
1.0000 mg | ORAL_TABLET | Freq: Two times a day (BID) | ORAL | Status: DC
  Administered 2013-07-16 – 2013-07-19 (×6): 1 mg via ORAL
  Filled 2013-07-16: qty 1
  Filled 2013-07-16: qty 6
  Filled 2013-07-16 (×4): qty 1
  Filled 2013-07-16: qty 6
  Filled 2013-07-16 (×4): qty 1

## 2013-07-16 NOTE — Progress Notes (Signed)
.  Psychoeducational Group Note    Date: 07/16/2013 Time:  0930  Goal Setting Purpose of Group: To be able to set a goal that is measurable and that can be accomplished in one day Participation Level:  Active  Participation Quality:  Appropriate  Affect:  Appropriate  Cognitive:  Alert  Insight:  Improving  Engagement in Group:  Engaged  Additional Comments:    Noemi Bellissimo A 

## 2013-07-16 NOTE — Progress Notes (Signed)
D) Pt. Has attended the groups and participates in them. Is slow in his responses but pays close attention to what is being said. Did contribute to the groups. Pt has started on the pain medication that was prescribed him, at home. States he thinks it is helping him a little. Rates his depression at a 3 and his hopelessness at a 5 Continues to have thoughts of SI on and off in the last 24 hours. A) Given support and reassurance along with appropriate praise.  R) Contracts for his safety on the unit.

## 2013-07-16 NOTE — Progress Notes (Signed)
Psychoeducational Group Note  Date: 07/16/2013 Time:  1015  Group Topic/Focus:  Identifying Needs:   The focus of this group is to help patients identify their personal needs that have been historically problematic and identify healthy behaviors to address their needs.  Participation Level: Active  Participation Quality:  Appropriate  Affect:  Appropriate  Cognitive:  Appropriate  Insight:  Improving  Engagement in Group:  Engaged  Additional Comments:    Najla Aughenbaugh A 

## 2013-07-16 NOTE — [facility] Group Notes (Signed)
[facility] Group Notes: (Clinical Social Work)   07/16/2013      Type of Therapy:  Group Therapy   Participation Level:  Did Not Attend    Ambrose Mantle, LCSW 07/16/2013, 5:05 PM
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
## 2013-07-16 NOTE — [facility] Suicide Risk Assessment (Signed)
Suicide Risk Assessment  Admission Assessment     Nursing information obtained from:    Demographic factors:    Current Mental Status:    Loss Factors:    Historical Factors:    Risk Reduction Factors:     CLINICAL FACTORS:   Severe Anxiety and/or Agitation Depression:   Anhedonia  COGNITIVE FEATURES THAT CONTRIBUTE TO RISK:  Closed-mindedness Polarized thinking    SUICIDE RISK:   Severe:  Frequent, intense, and enduring suicidal ideation, specific plan, no subjective intent, but some objective markers of intent (i.e., choice of lethal method), the method is accessible, some limited preparatory behavior, evidence of impaired self-control, severe dysphoria/symptomatology, multiple risk factors present, and few if any protective factors, particularly a lack of social support.  PLAN OF CARE:  I certify that inpatient services furnished can reasonably be expected to improve the patient's condition.  [name] 07/16/2013, 10:18 AM

## 2013-07-16 NOTE — Progress Notes (Signed)
Patient ID: [name], male   DOB: 1975/11/12, 37 y.o.   MRN: [id number] D: Patient in dayroom on approach. Pt mood/affect seem depressed and flat. Pt c/o of generalized pain and rated it as 9 out of 0-10 scale. Pt denies SI/HI/AVH. Pt attended evening wrap up group and engage minimally in discussion. Pt is observed lying in his bed after group with little interaction with peers. Pt denies any needs or concerns.  Cooperative with assessment. No acute distressed noted at this time.   A: Met with pt 1:1. Medications administered as prescribed. Writer encouraged pt to discuss feelings. Pt encouraged to come to staff with any question or concerns. 15 minutes checks for safety.  R: Patient remains safe. He is complaint with medications and denies any adverse reaction. Continue current POC.

## 2013-07-17 DIAGNOSIS — F332 Major depressive disorder, recurrent severe without psychotic features: Principal | ICD-10-CM

## 2013-07-17 DIAGNOSIS — F4310 Post-traumatic stress disorder, unspecified: Secondary | ICD-10-CM

## 2013-07-17 DIAGNOSIS — F411 Generalized anxiety disorder: Secondary | ICD-10-CM

## 2013-07-17 LAB — GLUCOSE, CAPILLARY
Glucose-Capillary: 262 mg/dL — ABNORMAL HIGH (ref 70–99)
Glucose-Capillary: 408 mg/dL — ABNORMAL HIGH (ref 70–99)
Glucose-Capillary: 166 mg/dL — ABNORMAL HIGH (ref 70–99)

## 2013-07-17 MED ORDER — INSULIN ASPART 100 UNIT/ML ~~LOC~~ SOLN
18.0000 [IU] | Freq: Once | SUBCUTANEOUS | Status: AC
  Administered 2013-07-17: 18 [IU] via SUBCUTANEOUS

## 2013-07-17 MED ORDER — CLONAZEPAM 1 MG PO TABS
2.0000 mg | ORAL_TABLET | Freq: Two times a day (BID) | ORAL | Status: DC
  Administered 2013-07-17 – 2013-07-19 (×4): 2 mg via ORAL
  Filled 2013-07-17 (×4): qty 2

## 2013-07-17 MED ORDER — TAPENTADOL HCL 50 MG PO TABS
50.0000 mg | ORAL_TABLET | Freq: Three times a day (TID) | ORAL | Status: DC
  Administered 2013-07-17 – 2013-07-19 (×6): 50 mg via ORAL
  Filled 2013-07-17 (×6): qty 1

## 2013-07-17 MED ORDER — METHOCARBAMOL 750 MG PO TABS
750.0000 mg | ORAL_TABLET | Freq: Three times a day (TID) | ORAL | Status: DC
  Administered 2013-07-17 – 2013-07-19 (×6): 750 mg via ORAL
  Filled 2013-07-17 (×12): qty 1

## 2013-07-17 MED ORDER — NABUMETONE 750 MG PO TABS
750.0000 mg | ORAL_TABLET | Freq: Two times a day (BID) | ORAL | Status: DC
  Administered 2013-07-17 – 2013-07-19 (×4): 750 mg via ORAL
  Filled 2013-07-17 (×8): qty 1

## 2013-07-17 MED ORDER — HYDROCODONE-ACETAMINOPHEN 10-325 MG PO TABS
1.0000 | ORAL_TABLET | Freq: Three times a day (TID) | ORAL | Status: DC | PRN
  Administered 2013-07-17 – 2013-07-18 (×4): 1 via ORAL
  Filled 2013-07-17 (×4): qty 1

## 2013-07-17 MED ORDER — TAPENTADOL HCL ER 250 MG PO TB12: 50.0000 mg | ORAL_TABLET | Freq: Three times a day (TID) | ORAL | Status: DC

## 2013-07-17 MED ORDER — TAPENTADOL HCL ER 250 MG PO TB12: 250.0000 mg | ORAL_TABLET | Freq: Two times a day (BID) | ORAL | Status: DC

## 2013-07-17 NOTE — Progress Notes (Signed)
D) Pt has attended the groups and interacts with his peers. Has contributed and shared in the groups and states that he has learned a lot today. Feels that he now knows that he has choices rather just everything happening. Rates his depression and hopelessness both at a 4 and denies SI and HI. A) Given support reassurance and praise. Encouraged to journal and to continue to realize that he has control over his actions. R) Denies SI and HI.

## 2013-07-17 NOTE — Progress Notes (Signed)
Psychoeducational Group Note  Date:  07/17/2013 Time:  1015  Group Topic/Focus:  Making Healthy Choices:   The focus of this group is to help patients identify negative/unhealthy choices they were using prior to admission and identify positive/healthier coping strategies to replace them upon discharge.  Participation Level:  Active  Participation Quality:  Appropriate  Affect:  Appropriate  Cognitive:  Oriented  Insight:  Improving  Engagement in Group:  Improving  Additional Comments:   Ciera Beckum A 07/17/2013 

## 2013-07-17 NOTE — [facility] Group Notes (Signed)
[facility] Group Notes:  (Clinical Social Work)  07/17/2013   1:15-2:30PM  Summary of Progress/Problems:   The main focus of today's process group was to   identify the patient's current support system and decide on other supports that can be put in place.  The picture on workbook was used to discuss why additional supports are needed, and a hand-out was distributed with four definitions/levels of support, then used to talk about how patients have given and received all different kinds of support.  An emphasis was placed on using counselor, doctor, therapy groups, 12-step groups, and problem-specific support groups to expand supports.  The patient identified his wife, 12yo daughter, father and 18yo son as supports and stated that his sisters and mother are currently not supportive, with him being unwilling to reach out to them at this time.  He has plans to get a therapy appointment prior to leaving the hospital, and is committed to using a counselor and psychiatrist as professional supports.  He was able to illustrate to the group how supports may change over time, through using his father as an example.  He had a hard time getting his words out at times, and all group members were patient and supportive throughout.  Type of Therapy:  Process Group  Participation Level:  Active  Participation Quality:  Attentive, Sharing and Supportive  Affect:  Blunted and Depressed  Cognitive:  Alert, Appropriate and Oriented  Insight:  Engaged  Engagement in Therapy:  Engaged  Modes of Intervention:  Education,  Support and [employer], LCSW 07/17/2013, 3:06 PM

## 2013-07-17 NOTE — Progress Notes (Signed)
Psychoeducational Group Note  Psychoeducational Group Note  Date: 07/17/2013 Time:  0930  Group Topic/Focus:  Gratefulness:  The focus of this group is to help patients identify what two things they are most grateful for in their lives. What helps ground them and to center them on their work to their recovery.  Participation Level:  Active  Participation Quality:  Appropriate  Affect:  Appropriate  Cognitive:  Oriented  Insight:  Improving  Engagement in Group:  Engaged  Additional Comments:    Catrina Fellenz A  

## 2013-07-17 NOTE — Progress Notes (Signed)
[facility] MD Progress Note  07/17/2013 5:15 PM [name]  MRN:  [id number] Subjective:  "I'm doing a little better today than yesterday, he states the groups are helping, and good pain control is helping as well. He reports sleeping for the first time last night and states this has helped as well.  Today he is awake, reports attending groups, he says his depression is a 4/10 and his anxiety is a 5/10. His pain is a 7/10 currently.  He states he is no longer suicidal all the time that those thoughts have decreased. Diagnosis:   DSM5: Depressive Disorders: Major Depressive Disorder - Severe (296.23)  AXIS I: Anxiety Disorder NOS, Major Depression, Recurrent severe and Post Traumatic Stress Disorder  AXIS II: Deferred  AXIS III:  Past Medical History   Diagnosis  Date   .  Allergy    .  Asthma    .  Depression    .  GERD (gastroesophageal reflux disease)    .  Hypertension    .  Hyperlipidemia    .  Chronic pain disorder      Sees [facility]   .  Urinary incontinence      detrusor instability   .  Hypogonadism    .  Diabetes mellitus      Type II    AXIS IV: other psychosocial or environmental problems, problems related to social environment and problems with primary support group  AXIS V: 41-50 serious symptoms   ADL's:  Intact  Sleep: Good  Appetite:  Fair  Suicidal Ideation:  Present on admission, but decreasing, no plan, no intent Homicidal Ideation:  denies AEB (as evidenced by):  Psychiatric Specialty Exam: Review of Systems  Constitutional: Negative.  Negative for fever, chills, weight loss, malaise/fatigue and diaphoresis.  HENT: Negative for congestion and sore throat.   Eyes: Negative for blurred vision, double vision and photophobia.  Respiratory: Negative for cough, shortness of breath and wheezing.   Cardiovascular: Negative for chest pain, palpitations and PND.  Gastrointestinal: Negative for heartburn, nausea, vomiting, abdominal pain,  diarrhea and constipation.  Musculoskeletal: Negative for myalgias, joint pain and falls.  Neurological: Negative for dizziness, tingling, tremors, sensory change, speech change, focal weakness, seizures, loss of consciousness, weakness and headaches.  Endo/Heme/Allergies: Negative for polydipsia. Does not bruise/bleed easily.  Psychiatric/Behavioral: Negative for depression, suicidal ideas, hallucinations, memory loss and substance abuse. The patient is not nervous/anxious and does not have insomnia.     Blood pressure 123/84, pulse 107, temperature 97.5 F (36.4 C), temperature source Oral, resp. rate 18, height 6\' 6"  (1.981 m), weight 104.015 kg (229 lb 5 oz), SpO2 98.00%.Body mass index is 26.5 kg/(m^2).  General Appearance: Casual  Eye Contact::  Fair  Speech:  Slow  Volume:  Normal  Mood:  Anxious and Depressed  Affect:  Flat  Thought Process:  Goal Directed  Orientation:  Full (Time, Place, and Person)  Thought Content:  WDL  Suicidal Thoughts:  Yes.  without intent/plan  Homicidal Thoughts:  No  Memory:  NA  Judgement:  Fair  Insight:  Present  Psychomotor Activity:  Normal  Concentration:  Fair  Recall:  Fair  Akathisia:  No  Handed:  Right  AIMS (if indicated):     Assets:  Communication Skills Desire for Improvement Housing Social Support  Sleep:  Number of Hours: 5.5   Current Medications: Current Facility-Administered Medications  Medication Dose Route Frequency Provider Last Rate Last Dose  . acetaminophen (TYLENOL) tablet 650  mg  650 mg Oral Q6H PRN Earney Navy, NP      . albuterol (PROVENTIL HFA;VENTOLIN HFA) 108 (90 BASE) MCG/ACT inhaler 1-2 puff  1-2 puff Inhalation Q6H PRN Earney Navy, NP      . alum & mag hydroxide-simeth (MAALOX/MYLANTA) 200-200-20 MG/5ML suspension 30 mL  30 mL Oral Q4H PRN Earney Navy, NP      . azelastine (ASTELIN) nasal spray 2 spray  2 spray Each Nare Daily Earney Navy, NP   2 spray at 07/17/13 0754  .  ciclesonide (OMNARIS) nasal spray 2 spray  2 spray Each Nare Daily Earney Navy, NP      . clonazePAM (KLONOPIN) tablet 2 mg  2 mg Oral BID Verne Spurr, PA-C      . DULoxetine (CYMBALTA) DR capsule 60 mg  60 mg Oral Daily Earney Navy, NP   60 mg at 07/17/13 0754  . HYDROcodone-acetaminophen (NORCO) 10-325 MG per tablet 1 tablet  1 tablet Oral Q8H PRN Verne Spurr, PA-C      . hydrOXYzine (ATARAX/VISTARIL) tablet 25 mg  25 mg Oral Q4H PRN Nanine Means, NP   25 mg at 07/16/13 2150  . insulin aspart (novoLOG) injection 0-15 Units  0-15 Units Subcutaneous TID WC Earney Navy, NP   8 Units at 07/17/13 1201  . insulin aspart (novoLOG) injection 0-5 Units  0-5 Units Subcutaneous QHS Earney Navy, NP   2 Units at 07/16/13 2204  . insulin glargine (LANTUS) injection 84 Units  84 Units Subcutaneous QHS Earney Navy, NP   84 Units at 07/16/13 2204  . loratadine (CLARITIN) tablet 10 mg  10 mg Oral Daily Earney Navy, NP   10 mg at 07/17/13 0754  . magnesium hydroxide (MILK OF MAGNESIA) suspension 30 mL  30 mL Oral Daily PRN Earney Navy, NP      . metFORMIN (GLUCOPHAGE) tablet 1,000 mg  1,000 mg Oral BID WC Earney Navy, NP   1,000 mg at 07/17/13 1616  . methocarbamol (ROBAXIN) tablet 750 mg  750 mg Oral TID Verne Spurr, PA-C      . nabumetone (RELAFEN) tablet 750 mg  750 mg Oral BID Verne Spurr, PA-C   750 mg at 07/17/13 1616  . niacin CR capsule 1,000 mg  1,000 mg Oral Daily Earney Navy, NP   1,000 mg at 07/17/13 0753  . nicotine (NICODERM CQ - dosed in mg/24 hours) patch 21 mg  21 mg Transdermal Daily Earney Navy, NP   21 mg at 07/17/13 0755  . omega-3 acid ethyl esters (LOVAZA) capsule 4 g  4 g Oral QHS Earney Navy, NP   4 g at 07/16/13 2150  . ondansetron (ZOFRAN) tablet 4 mg  4 mg Oral Q8H PRN Earney Navy, NP      . pregabalin (LYRICA) capsule 100 mg  100 mg Oral TID Earney Navy, NP   100 mg at 07/17/13 1616  .  risperiDONE (RISPERDAL M-TABS) disintegrating tablet 1 mg  1 mg Oral BID Nanine Means, NP   1 mg at 07/17/13 1616  . simvastatin (ZOCOR) tablet 20 mg  20 mg Oral Daily Earney Navy, NP   20 mg at 07/17/13 0753  . tapentadol (NUCYNTA) tablet 50 mg  50 mg Oral TID WC Verne Spurr, PA-C      . traZODone (DESYREL) tablet 100 mg  100 mg Oral QHS Nanine Means, NP   100 mg at  07/16/13 2150    Lab Results:  Results for orders placed during the hospital encounter of 07/15/13 (from the past 48 hour(s))  TSH     Status: None   Collection Time    07/15/13  7:55 PM      Result Value Range   TSH 1.022  0.350 - 4.500 uIU/mL   Comment: Performed at [facility]  GLUCOSE, CAPILLARY     Status: Abnormal   Collection Time    07/15/13  9:01 PM      Result Value Range   Glucose-Capillary 143 (*) 70 - 99 mg/dL  GLUCOSE, CAPILLARY     Status: Abnormal   Collection Time    07/16/13  6:17 AM      Result Value Range   Glucose-Capillary 118 (*) 70 - 99 mg/dL  GLUCOSE, CAPILLARY     Status: Abnormal   Collection Time    07/16/13 11:44 AM      Result Value Range   Glucose-Capillary 124 (*) 70 - 99 mg/dL  GLUCOSE, CAPILLARY     Status: Abnormal   Collection Time    07/16/13  5:05 PM      Result Value Range   Glucose-Capillary 181 (*) 70 - 99 mg/dL   Comment 1 Documented in Chart     Comment 2 Notify RN    GLUCOSE, CAPILLARY     Status: Abnormal   Collection Time    07/16/13  8:57 PM      Result Value Range   Glucose-Capillary 211 (*) 70 - 99 mg/dL  GLUCOSE, CAPILLARY     Status: Abnormal   Collection Time    07/17/13  6:18 AM      Result Value Range   Glucose-Capillary 193 (*) 70 - 99 mg/dL  GLUCOSE, CAPILLARY     Status: Abnormal   Collection Time    07/17/13 11:59 AM      Result Value Range   Glucose-Capillary 262 (*) 70 - 99 mg/dL   Comment 1 Documented in Chart     Comment 2 Notify RN    GLUCOSE, CAPILLARY     Status: Abnormal   Collection Time    07/17/13  5:08 PM       Result Value Range   Glucose-Capillary 408 (*) 70 - 99 mg/dL    Physical Findings: AIMS: Facial and Oral Movements Muscles of Facial Expression: None, normal Lips and Perioral Area: None, normal Jaw: None, normal Tongue: None, normal,Extremity Movements Upper (arms, wrists, hands, fingers): None, normal Lower (legs, knees, ankles, toes): None, normal, Trunk Movements Neck, shoulders, hips: None, normal, Overall Severity Severity of abnormal movements (highest score from questions above): None, normal Incapacitation due to abnormal movements: None, normal Patient's awareness of abnormal movements (rate only patient's report): No Awareness, Dental Status Current problems with teeth and/or dentures?: No Does patient usually wear dentures?: No  CIWA:  CIWA-Ar Total: 7 COWS:  COWS Total Score: 4  Treatment Plan Summary: Daily contact with patient to assess and evaluate symptoms and progress in treatment Medication management  Plan: 1. Continue crisis management and stabilization. 2. Medication management to reduce current symptoms to base line and improve patient's overall level of functioning 3. Treat health problems as indicated. 4. Develop treatment plan to decrease risk of relapse upon discharge and the need for readmission. 5. Psycho-social education regarding relapse prevention and self care. 6. Health care follow up as needed for medical problems. 7. Continue home medications where appropriate. 8. ELOS: 2 days 9.  Medications are verified.  Medical Decision Making Problem Points:  Established problem, stable/improving (1) Data Points:  Review or order medicine tests (1) Review of medication regiment & side effects (2)  I certify that inpatient services furnished can reasonably be expected to improve the patient's condition.  Rona Ravens. Mashburn RPAC 5:20 PM 07/17/2013 I agreed with findings and treatment plan of this patient.

## 2013-07-17 NOTE — Progress Notes (Signed)
Writer spoke with patient and he c/o neck and back pain and requested vicodin which he received for pain. Patient reports that overall his day has been good and reports the risperdone he was started on worked really well for him earlier. Patient informed of scheduled hs meds and is agreeable to taking them at scheduled time. Patient denies si/hi/a/v hallucinations. Support and encouragement offered, safety maintained with 15 min checks, will continue to monitor.
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
## 2013-07-18 LAB — GLUCOSE, CAPILLARY
Glucose-Capillary: 86 mg/dL (ref 70–99)
Glucose-Capillary: 174 mg/dL — ABNORMAL HIGH (ref 70–99)

## 2013-07-18 NOTE — Progress Notes (Signed)
Patient ID: [name], male   DOB: [birth year], 37 y.o.   MRN: [id number] [facility] MD Progress Note  07/18/2013 3:28 PM [facility] [place]  MRN:  [id number]  Subjective:  Patient presented with the depression and suicidal ideation. Patient continued to have symptoms of anxiety and depression patient also focuses on his chronic pain syndrome. Patient rated his depression has 3/10 and anxiety 4/10 and it chronic pain 8/10 today. Patient is actively participating in unit activities including groups and learning coping skills. Patient has been sleeping about and has not distance of appetite.  patient denies current symptoms of suicidal ideations and has no suicidal behaviors are jesters on the unit.   DIAgnosis:   DSM5: Depressive Disorders: Major Depressive Disorder - Severe (296.23)  AXIS I: Anxiety Disorder NOS, Major Depression, Recurrent severe and Post Traumatic Stress Disorder  AXIS II: Deferred  AXIS III:  Past Medical History   Diagnosis  Date   .  Allergy    .  Asthma    .  Depression    .  GERD (gastroesophageal reflux disease)    .  Hypertension    .  Hyperlipidemia    .  Chronic pain disorder      Sees [facility]   .  Urinary incontinence      detrusor instability   .  Hypogonadism    .  Diabetes mellitus      Type II    AXIS IV: other psychosocial or environmental problems, problems related to social environment and problems with primary support group  AXIS V: 41-50 serious symptoms   ADL's:  Intact  Sleep: Good  Appetite:  Fair  Suicidal Ideation:  Present on admission, but decreasing, no plan, no intent Homicidal Ideation:  denies AEB (as evidenced by):  Psychiatric Specialty Exam: Review of Systems  Constitutional: Negative.  Negative for fever, chills, weight loss, malaise/fatigue and diaphoresis.  HENT: Negative for congestion and sore throat.   Eyes: Negative for blurred vision, double vision and photophobia.  Respiratory: Negative for  cough, shortness of breath and wheezing.   Cardiovascular: Negative for chest pain, palpitations and PND.  Gastrointestinal: Negative for heartburn, nausea, vomiting, abdominal pain, diarrhea and constipation.  Musculoskeletal: Negative for myalgias, joint pain and falls.  Neurological: Negative for dizziness, tingling, tremors, sensory change, speech change, focal weakness, seizures, loss of consciousness, weakness and headaches.  Endo/Heme/Allergies: Negative for polydipsia. Does not bruise/bleed easily.  Psychiatric/Behavioral: Negative for depression, suicidal ideas, hallucinations, memory loss and substance abuse. The patient is not nervous/anxious and does not have insomnia.     Blood pressure 127/89, pulse 116, temperature 96.5 F (35.8 C), temperature source Oral, resp. rate 16, height 6\' 6"  (1.981 m), weight 104.015 kg (229 lb 5 oz), SpO2 98.00%.Body mass index is 26.5 kg/(m^2).  General Appearance: Casual  Eye Contact::  Fair  Speech:  Slow  Volume:  Normal  Mood:  Anxious and Depressed  Affect:  Flat  Thought Process:  Goal Directed  Orientation:  Full (Time, Place, and Person)  Thought Content:  WDL  Suicidal Thoughts:  Yes.  without intent/plan  Homicidal Thoughts:  No  Memory:  NA  Judgement:  Fair  Insight:  Present  Psychomotor Activity:  Normal  Concentration:  Fair  Recall:  Fair  Akathisia:  No  Handed:  Right  AIMS (if indicated):     Assets:  Communication Skills Desire for Improvement Housing Social Support  Sleep:  Number of Hours: 5.5   Current  Medications: Current Facility-Administered Medications  Medication Dose Route Frequency Provider Last Rate Last Dose  . acetaminophen (TYLENOL) tablet 650 mg  650 mg Oral Q6H PRN Earney Navy, NP      . albuterol (PROVENTIL HFA;VENTOLIN HFA) 108 (90 BASE) MCG/ACT inhaler 1-2 puff  1-2 puff Inhalation Q6H PRN Earney Navy, NP      . alum & mag hydroxide-simeth (MAALOX/MYLANTA) 200-200-20 MG/5ML  suspension 30 mL  30 mL Oral Q4H PRN Earney Navy, NP      . azelastine (ASTELIN) nasal spray 2 spray  2 spray Each Nare Daily Earney Navy, NP   2 spray at 07/18/13 0802  . ciclesonide (OMNARIS) nasal spray 2 spray  2 spray Each Nare Daily Earney Navy, NP      . clonazePAM (KLONOPIN) tablet 2 mg  2 mg Oral BID Verne Spurr, PA-C   2 mg at 07/18/13 0802  . DULoxetine (CYMBALTA) DR capsule 60 mg  60 mg Oral Daily Earney Navy, NP   60 mg at 07/18/13 0802  . HYDROcodone-acetaminophen (NORCO) 10-325 MG per tablet 1 tablet  1 tablet Oral Q8H PRN Verne Spurr, PA-C   1 tablet at 07/18/13 1442  . hydrOXYzine (ATARAX/VISTARIL) tablet 25 mg  25 mg Oral Q4H PRN Nanine Means, NP   25 mg at 07/17/13 2249  . insulin aspart (novoLOG) injection 0-15 Units  0-15 Units Subcutaneous TID WC Earney Navy, NP   11 Units at 07/18/13 1159  . insulin aspart (novoLOG) injection 0-5 Units  0-5 Units Subcutaneous QHS Earney Navy, NP   2 Units at 07/16/13 2204  . insulin glargine (LANTUS) injection 84 Units  84 Units Subcutaneous QHS Earney Navy, NP   84 Units at 07/17/13 2147  . loratadine (CLARITIN) tablet 10 mg  10 mg Oral Daily Earney Navy, NP   10 mg at 07/18/13 0802  . magnesium hydroxide (MILK OF MAGNESIA) suspension 30 mL  30 mL Oral Daily PRN Earney Navy, NP      . metFORMIN (GLUCOPHAGE) tablet 1,000 mg  1,000 mg Oral BID WC Earney Navy, NP   1,000 mg at 07/18/13 0803  . methocarbamol (ROBAXIN) tablet 750 mg  750 mg Oral TID Verne Spurr, PA-C   750 mg at 07/18/13 1156  . nabumetone (RELAFEN) tablet 750 mg  750 mg Oral BID Verne Spurr, PA-C   750 mg at 07/18/13 0804  . niacin CR capsule 1,000 mg  1,000 mg Oral Daily Earney Navy, NP   1,000 mg at 07/18/13 0802  . nicotine (NICODERM CQ - dosed in mg/24 hours) patch 21 mg  21 mg Transdermal Daily Earney Navy, NP   21 mg at 07/18/13 0804  . omega-3 acid ethyl esters (LOVAZA) capsule 4 g   4 g Oral QHS Earney Navy, NP   4 g at 07/17/13 2102  . ondansetron (ZOFRAN) tablet 4 mg  4 mg Oral Q8H PRN Earney Navy, NP      . pregabalin (LYRICA) capsule 100 mg  100 mg Oral TID Earney Navy, NP   100 mg at 07/18/13 1156  . risperiDONE (RISPERDAL M-TABS) disintegrating tablet 1 mg  1 mg Oral BID Nanine Means, NP   1 mg at 07/18/13 0804  . simvastatin (ZOCOR) tablet 20 mg  20 mg Oral Daily Earney Navy, NP   20 mg at 07/18/13 0804  . tapentadol (NUCYNTA) tablet 50 mg  50 mg Oral  TID WC Verne Spurr, PA-C   50 mg at 07/18/13 1156  . traZODone (DESYREL) tablet 100 mg  100 mg Oral QHS Nanine Means, NP   100 mg at 07/17/13 2101    Lab Results:  Results for orders placed during the hospital encounter of 07/15/13 (from the past 48 hour(s))  GLUCOSE, CAPILLARY     Status: Abnormal   Collection Time    07/16/13  5:05 PM      Result Value Range   Glucose-Capillary 181 (*) 70 - 99 mg/dL   Comment 1 Documented in Chart     Comment 2 Notify RN    GLUCOSE, CAPILLARY     Status: Abnormal   Collection Time    07/16/13  8:57 PM      Result Value Range   Glucose-Capillary 211 (*) 70 - 99 mg/dL  GLUCOSE, CAPILLARY     Status: Abnormal   Collection Time    07/17/13  6:18 AM      Result Value Range   Glucose-Capillary 193 (*) 70 - 99 mg/dL  GLUCOSE, CAPILLARY     Status: Abnormal   Collection Time    07/17/13 11:59 AM      Result Value Range   Glucose-Capillary 262 (*) 70 - 99 mg/dL   Comment 1 Documented in Chart     Comment 2 Notify RN    GLUCOSE, CAPILLARY     Status: Abnormal   Collection Time    07/17/13  5:08 PM      Result Value Range   Glucose-Capillary 408 (*) 70 - 99 mg/dL  GLUCOSE, CAPILLARY     Status: Abnormal   Collection Time    07/17/13  9:06 PM      Result Value Range   Glucose-Capillary 166 (*) 70 - 99 mg/dL  GLUCOSE, CAPILLARY     Status: None   Collection Time    07/18/13  6:16 AM      Result Value Range   Glucose-Capillary 86  70 - 99  mg/dL  GLUCOSE, CAPILLARY     Status: Abnormal   Collection Time    07/18/13 11:53 AM      Result Value Range   Glucose-Capillary 306 (*) 70 - 99 mg/dL   Comment 1 Notify RN      Physical Findings: AIMS: Facial and Oral Movements Muscles of Facial Expression: None, normal Lips and Perioral Area: None, normal Jaw: None, normal Tongue: None, normal,Extremity Movements Upper (arms, wrists, hands, fingers): None, normal Lower (legs, knees, ankles, toes): None, normal, Trunk Movements Neck, shoulders, hips: None, normal, Overall Severity Severity of abnormal movements (highest score from questions above): None, normal Incapacitation due to abnormal movements: None, normal Patient's awareness of abnormal movements (rate only patient's report): No Awareness, Dental Status Current problems with teeth and/or dentures?: No Does patient usually wear dentures?: No  CIWA:  CIWA-Ar Total: 7 COWS:  COWS Total Score: 4  Treatment Plan Summary: Daily contact with patient to assess and evaluate symptoms and progress in treatment Medication management  Plan: 1. Continue crisis management and stabilization. 2. Medication management to reduce current symptoms to base line and improve patient's overall level of functioning 3. Treat health problems as indicated. 4. Develop treatment plan to decrease risk of relapse upon discharge and the need for readmission. 5. Psycho-social education regarding relapse prevention and self care. 6. Health care follow up as needed for medical problems. 7. Continue home medications where appropriate. 8. ELOS: 1 or 2 days   Medical  Decision Making Problem Points:  Established problem, stable/improving (1) Data Points:  Review or order medicine tests (1) Review of medication regiment & side effects (2)  I certify that inpatient services furnished can reasonably be expected to improve the patient's condition.   Daksha Koone,JANARDHAHA R. 07/18/2013 3:30 PM

## 2013-07-18 NOTE — Progress Notes (Signed)
Recreation Therapy Notes  Date: 09.08.2014 Time: 3:00pm Location: [address] Dayroom  Group Topic: Wellness  Goal Area(s) Addresses:  Patient will define components of whole wellness. Patient will verbalize benefit of whole wellness.  Behavioral Response: Engaged, Attentive, Appropriate  Intervention: Air traffic controller   Activity: [employer]. Patients were given a worksheet with the six dimensions of wellness: Physical, Social, Intellectual, Emotional, Environmental, and Spiritual. Patients were asked to identify two ways they address each dimension.   Education: Wellness, Discharge Planning   Education Outcome: Acknowledges understanding  Clinical Observations/Feedback: Patient attended group session, participated in activity, but made no contributions to opening or wrap up discussion. Patient appeared to actively listen to LRT and group members, as he maintained appropriate eye contact and verbally agreed with or nodded in agreement with points of interest.  Jearl Klinefelter, LRT/CTRS  Jearl Klinefelter 07/18/2013 10:44 PM

## 2013-07-18 NOTE — Progress Notes (Signed)
Adult Psychoeducational Group Note  Date:  07/18/2013 Time:  11:00am Group Topic/Focus:  Wellness Toolbox:   The focus of this group is to discuss various aspects of wellness, balancing those aspects and exploring ways to increase the ability to experience wellness.  Patients will create a wellness toolbox for use upon discharge.  Participation Level:  Active  Participation Quality:  Appropriate and Attentive  Affect:  Appropriate  Cognitive:  Alert and Appropriate  Insight: Appropriate  Engagement in Group:  Engaged  Modes of Intervention:  Discussion and Education  Additional Comments:  Pt attended and participated in group. Pt when ask what he did to take care of himself pt stated take meds and keeps his dr.'s appts. When asked what was his biggest struggle pt stated to get on a schedule because when he does not accomplish what he needs to do he gets angry.  Shelly Bombard D 07/18/2013, 1:44 PM

## 2013-07-18 NOTE — Progress Notes (Signed)
Writer spoke with patient 1:1 at medication window and he reports having had a good day with his some of his family members visiting on today. Patient has been observed up in the dayroom interacting with peers. Patient request pain medication which he rates at a 6. Writer informed patient that his new order is for 1 vicodin in stead of 2 which he had received on the previous night. Patient inquired as to why this was changed and Clinical research associate suggested that he speak with his doctor on tomorrow concerning his medications. Support offered and safety maintained on unit with 15 min checks. Will continue to monitor.

## 2013-07-18 NOTE — Progress Notes (Signed)
Adult Psychoeducational Group Note  Date:  07/17/13 Time:  8:00 pm  Group Topic/Focus:  Wrap-Up Group:   The focus of this group is to help patients review their daily goal of treatment and discuss progress on daily workbooks.  Participation Level:  Active  Participation Quality:  Appropriate  Affect:  Appropriate  Cognitive:  Appropriate  Insight: Appropriate  Engagement in Group:  Engaged  Modes of Intervention:  Discussion, Education, Socialization and Support  Additional Comments:  Pt stated that he was in the hospital to seek treatment for his depression. Pt stated that he was having SI/HI and raging emotionally. Pt stated that he is a good father and has a good sense of humor. Pt stated that he is getting a lot out of the program.   Laural Benes, Bence Trapp 07/18/2013, 1:02 AM

## 2013-07-18 NOTE — [facility] Group Notes (Signed)
[facility] LCSW Aftercare Discharge Planning Group Note   07/18/2013 12:32 PM    Participation Quality:  Appropraite  Mood/Affect:  Appropriate  Depression Rating:  4  Anxiety Rating:  5-6  Thoughts of Suicide:  No  Will you contract for safety?   NA  Current AVH:  No  Plan for Discharge/Comments:  Patient attending discharge planning group and actively participated in group.  Patient advised he was scheduled to be seen at [facility] but has not met with MD.  He is interested in follow up with [facility].  CSW provided all participants with daily workbook.    Transportation Means: Patient has transportation.   Supports:  Patient has a support system.   [name], [name]

## 2013-07-18 NOTE — Progress Notes (Signed)
Inpatient Diabetes Program Recommendations  AACE/ADA: New Consensus Statement on Inpatient Glycemic Control (2013)  Target Ranges:  Prepandial:   less than 140 mg/dL      Peak postprandial:   less than 180 mg/dL (1-2 hours)      Critically ill patients:  140 - 180 mg/dL     Results for [name], [name] (MRN [id number]) as of 07/18/2013 09:32  Ref. Range 07/17/2013 06:18 07/17/2013 11:59 07/17/2013 17:08 07/17/2013 21:06  Glucose-Capillary Latest Range: 70-99 mg/dL 409 (H) 811 (H) 914 (H) 166 (H)    **Patient required 29 units of Novolog SSI coverage yesterday (09/07)  **MD- Please consider adding Novolog meal coverage while in hospital- Novolog 6 units tid with meals (hold if patient NPO or eats less than 50% of meals)   Will follow. Ambrose Finland RN, MSN, CDE Diabetes Coordinator Inpatient Diabetes Program [phone]

## 2013-07-18 NOTE — Progress Notes (Signed)
D: Patient denies SI/HI and A/V hallucinations; patient reports sleep is poor because of pain; reports appetite is good; reports energy level is low ; reports ability to pay attention is poor; rates depression as 5/10; rates hopelessness 4/10;   A: Monitored q 15 minutes; patient encouraged to attend groups; patient educated about medications; patient given medications per physician orders; patient encouraged to express feelings and/or concerns  R: Patient is preoccupied with his pain medication but he is cooperative; patient is flat and blunted and continuously rates pain 8/10 and practitioners are aware.; patient's interaction with staff and peers is appropriate;  patient is taking medications as prescribed and tolerating medications; patient is attending all groups

## 2013-07-18 NOTE — Tx Team (Signed)
Interdisciplinary Treatment Plan Update   Date Reviewed:  07/18/2013  Time Reviewed:  9:55 AM  Progress in Treatment:   Attending groups: Yes Participating in groups: Yes Taking medication as prescribed: Yes  Tolerating medication: Yes Family/Significant other contact made: No, but will ask patient for consent for collateral contact Patient understands diagnosis: Yes  Discussing patient identified problems/goals with staff: Yes Medical problems stabilized or resolved: Yes Denies suicidal/homicidal ideation: Yes Patient has not harmed self or others: Yes  For review of initial/current patient goals, please see plan of care.  Estimated Length of Stay:  2-3 days  Reasons for Continued Hospitalization:  Anxiety Depression Medication stabilization  New Problems/Goals identified:    Discharge Plan or Barriers:   Home with outpatient follow up [facility]  Additional Comments: Patient admitted with SI and Depression  Attendees:  Patient:  07/18/2013 9:55 AM   Signature: Mervyn Gay, MD 07/18/2013 9:55 AM  Signature:  Verne Spurr, PA 07/18/2013 9:55 AM  Signature:  07/18/2013 9:55 AM  Signature:Beverly Terrilee Croak, RN 07/18/2013 9:55 AM  Signature:  Neill Loft RN 07/18/2013 9:55 AM  Signature:  Juline Patch, LCSW 07/18/2013 9:55 AM  Signature:  Reyes Ivan, LCSW 07/18/2013 9:55 AM  Signature:  Maseta Dorley,Care Coordinator 07/18/2013 9:55 AM  Signature:  07/18/2013 9:55 AM  Signature: Leighton Parody, RN 07/18/2013  9:55 AM  Signature:    Signature:      Scribe for Treatment Team:   Juline Patch,  07/18/2013 9:55 AM

## 2013-07-18 NOTE — Progress Notes (Signed)
Patient ID: KOBY PICKUP, male   DOB: [birth year], 37 y.o.   MRN: [id number] PER STATE REGULATIONS 482.30  THIS CHART WAS REVIEWED FOR MEDICAL NECESSITY WITH RESPECT TO THE PATIENT'S ADMISSION/ DURATION OF STAY.  NEXT REVIEW DATE: 07/22/2013  Willa Rough, RN, BSN CASE MANAGER
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
## 2013-07-18 NOTE — Progress Notes (Signed)
Chaplain consulted with pt while rounding on [address] due to pt's tearfulness in group room.  Pt fearful of outcome of recent interaction with Social Work.  Chaplain provided emotional support with pt around fears and uncertainty.   Consulted with SW around interaction.   Belva Crome MDiv

## 2013-07-19 ENCOUNTER — Ambulatory Visit: Payer: No Typology Code available for payment source | Admitting: Family

## 2013-07-19 LAB — GLUCOSE, CAPILLARY

## 2013-07-19 MED ORDER — OMEGA-3-ACID ETHYL ESTERS 1 G PO CAPS: 4.0000 g | ORAL_CAPSULE | Freq: Every day | ORAL | Status: DC

## 2013-07-19 MED ORDER — AZELASTINE HCL 0.1 % NA SOLN: 2.0000 | NASAL | Status: DC

## 2013-07-19 MED ORDER — TRAZODONE HCL 100 MG PO TABS: 100.0000 mg | ORAL_TABLET | Freq: Every day | ORAL | Status: DC

## 2013-07-19 MED ORDER — TAPENTADOL HCL ER 250 MG PO TB12: 250.0000 mg | ORAL_TABLET | Freq: Two times a day (BID) | ORAL | Status: DC

## 2013-07-19 MED ORDER — TADALAFIL 5 MG PO TABS: 5.0000 mg | ORAL_TABLET | Freq: Every day | ORAL | Status: DC

## 2013-07-19 MED ORDER — NABUMETONE 750 MG PO TABS: 750.0000 mg | ORAL_TABLET | Freq: Two times a day (BID) | ORAL | Status: DC

## 2013-07-19 MED ORDER — CETIRIZINE HCL 10 MG PO TABS: 10.0000 mg | ORAL_TABLET | Freq: Two times a day (BID) | ORAL | Status: DC

## 2013-07-19 MED ORDER — INSULIN GLARGINE 100 UNIT/ML SOLOSTAR PEN: 84.0000 [IU] | PEN_INJECTOR | Freq: Every day | SUBCUTANEOUS | Status: DC

## 2013-07-19 MED ORDER — NIACIN ER (ANTIHYPERLIPIDEMIC) 1000 MG PO TBCR: 1000.0000 mg | EXTENDED_RELEASE_TABLET | Freq: Every day | ORAL | Status: DC

## 2013-07-19 MED ORDER — DULOXETINE HCL 60 MG PO CPEP: 60.0000 mg | ORAL_CAPSULE | Freq: Every day | ORAL | Status: DC

## 2013-07-19 MED ORDER — PREGABALIN 100 MG PO CAPS: 100.0000 mg | ORAL_CAPSULE | Freq: Three times a day (TID) | ORAL | Status: DC

## 2013-07-19 MED ORDER — METFORMIN HCL 1000 MG PO TABS: 1000.0000 mg | ORAL_TABLET | Freq: Two times a day (BID) | ORAL | Status: DC

## 2013-07-19 MED ORDER — RISPERIDONE 1 MG PO TBDP: 1.0000 mg | ORAL_TABLET | Freq: Two times a day (BID) | ORAL | Status: DC

## 2013-07-19 MED ORDER — CLONAZEPAM 2 MG PO TABS: 2.0000 mg | ORAL_TABLET | Freq: Two times a day (BID) | ORAL | Status: DC

## 2013-07-19 MED ORDER — METHOCARBAMOL 750 MG PO TABS: 750.0000 mg | ORAL_TABLET | Freq: Three times a day (TID) | ORAL | Status: DC

## 2013-07-19 MED ORDER — SIMVASTATIN 20 MG PO TABS: 20.0000 mg | ORAL_TABLET | Freq: Every day | ORAL | Status: DC

## 2013-07-19 NOTE — Progress Notes (Signed)
The focus of this group is to educate the patient on the purpose and policies of crisis stabilization and provide a format to answer questions about their admission.  The group details unit policies and expectations of patients while admitted.  Patient attended 0900 nurse education orientation group.  Patient actively participated, appropriate affect, alert, appropriate insight and engagement.  Patient will work on goals for discharge today.  

## 2013-07-19 NOTE — Progress Notes (Signed)
Adult Psychoeducational Group Note  Date:  07/19/2013 Time:  11:00am  Group Topic/Focus:  Recovery Goals:   The focus of this group is to identify appropriate goals for recovery and establish a plan to achieve them.  Participation Level:  Active  Participation Quality:  Appropriate and Attentive  Affect:  Appropriate  Cognitive:  Alert and Appropriate  Insight: Appropriate  Engagement in Group:  Engaged  Modes of Intervention:  Discussion and Education  Additional Comments:  Pt attended and participated in group. When asked what recovery meant to him pt stated getting out of this hole that bought him here. When ask what was one thing he could do to help himself on his road to recovery pt stated see his doctor,therapist,take his meds and be around supportive people.  Shelly Bombard D 07/19/2013, 1:46 PM

## 2013-07-19 NOTE — [facility] Suicide Risk Assessment (Signed)
[facility] INPATIENT:  Family/Significant Other Suicide Prevention Education  Suicide Prevention Education:  Education Completed;[name], Wife, [phone] has been identified by the patient as the family member/significant other with whom the patient will be residing, and identified as the person(s) who will aid the patient in the event of a mental health crisis (suicidal ideations/suicide attempt).  With written consent from the patient, the family member/significant other has been provided the following suicide prevention education, prior to the and/or following the discharge of the patient.  Wife advised she is familiar with Suicide Prevention Education as she was recently a patient here.  The suicide prevention education provided includes the following:  Suicide risk factors  Suicide prevention and interventions  National Suicide Hotline telephone number  [facility] assessment telephone number  [facility] Emergency Assistance 911  [facility] and/or Residential Mobile Crisis Unit telephone number  Request made of family/significant other to:  Remove weapons (e.g., guns, rifles, knives), all items previously/currently identified as safety concern.  Wife advised patient does not have access to weapons.     Remove drugs/medications (over-the-counter, prescriptions, illicit drugs), all items previously/currently identified as a safety concern.  The family member/significant other verbalizes understanding of the suicide prevention education information provided.  The family member/significant other agrees to remove the items of safety concern listed above.  [name] 07/19/2013, 9:59 AM

## 2013-07-19 NOTE — Progress Notes (Signed)
[facility] Adult Case Management Discharge Plan :  Will you be returning to the same living situation after discharge: Yes,  Patient is returning home. At discharge, do you have transportation home?:Yes,  Patient has transportation. Do you have the ability to pay for your medications:Yes,  Patient is able to afford medications.  Release of information consent forms completed and in the chart;  Patient's signature needed at discharge.  Patient to Follow up at: Follow-up Information   [facility] On 07/20/2013. (You are scheduled to start [facility] on Wednesday, July 20, 2013 at 8:45 AM)    Contact information:   [address] [place], [place]   [address]  [phone]      Patient denies SI/HI:   Patient no longer endorses SI/HI other thought self harm     Safety Planning and Suicide Prevention discussed:  .Reviewed with all patients during discharge planning group   Herbert Aguinaldo, Joesph July 07/19/2013, 10:49 AM

## 2013-07-19 NOTE — [facility] Suicide Risk Assessment (Signed)
Suicide Risk Assessment  Discharge Assessment     Demographic Factors:  Male, Adolescent or young adult, Low socioeconomic status and Unemployed  Mental Status Per Nursing Assessment::   On Admission:     Current Mental Status by Physician: Mental Status Examination: Patient appeared as per his stated age, casually dressed, and fairly groomed, and maintaining good eye contact. Patient has good mood and his affect was constricted. He has normal rate, rhythm, and volume of speech. His thought process is linear and goal directed. Patient has denied suicidal, homicidal ideations, intentions or plans. Patient has no evidence of auditory or visual hallucinations, delusions, and paranoia. Patient has fair insight judgment and impulse control.  Loss Factors: Decline in physical health and Financial problems/change in socioeconomic status  Historical Factors: Prior suicide attempts and Impulsivity  Risk Reduction Factors:   Sense of responsibility to family, Religious beliefs about death, Positive social support, Positive therapeutic relationship and Positive coping skills or problem solving skills  Continued Clinical Symptoms:  Severe Anxiety and/or Agitation Depression:   Anhedonia Recent sense of peace/wellbeing Unstable or Poor Therapeutic Relationship Previous Psychiatric Diagnoses and Treatments Medical Diagnoses and Treatments/Surgeries  Cognitive Features That Contribute To Risk:  Polarized thinking    Suicide Risk:  Minimal: No identifiable suicidal ideation.  Patients presenting with no risk factors but with morbid ruminations; may be classified as minimal risk based on the severity of the depressive symptoms  Discharge Diagnoses:   AXIS I:  Anxiety Disorder NOS and Major Depression, Recurrent severe AXIS II:  Deferred AXIS III:   Past Medical History  Diagnosis Date  . Allergy   . Asthma   . Depression   . GERD (gastroesophageal reflux disease)   . Hypertension   .  Hyperlipidemia   . Chronic pain disorder     Sees [facility]  . Urinary incontinence     detrusor instability  . Hypogonadism   . Diabetes mellitus     Type II   AXIS IV:  occupational problems, other psychosocial or environmental problems, problems related to social environment and problems with primary support group AXIS V:  51-60 moderate symptoms  Plan Of Care/Follow-up recommendations:  Activity:  as tolerated Diet:  regular  Is patient on multiple antipsychotic therapies at discharge:  No   Has Patient had three or more failed trials of antipsychotic monotherapy by history:  No  Recommended Plan for Multiple Antipsychotic Therapies: NA  Shean Gerding,JANARDHAHA R. 07/19/2013, 1:46 PM

## 2013-07-19 NOTE — Progress Notes (Signed)
Patient ID: [name], male   DOB: [birth year], 37 y.o.   MRN: [id number] Patient discharged per physician order; patient denies SI/HI and A/V hallucinations; patient is received samples, prescriptions and copy of AVS after it was reviewed; patient verbalized understanding; patient had no other questions or concerns at this time;  patient left the unit ambulatory

## 2013-07-19 NOTE — Progress Notes (Signed)
D: Pt is flat in affect and anxious in mood. Pt is currently denying any SI/HI/AVH. Pt observed interacting appropriately within the milieu. Pt attended group this evening. [name] pain managed with Norco this evening.  A: Writer administered scheduled and prn medications to pt. Continued support and availability as needed was extended to this pt. Staff continue to monitor pt with q8min checks.  R: No adverse drug reactions noted. Pt receptive to treatment. Pt remains safe at this time.
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
## 2013-07-20 ENCOUNTER — Telehealth: Payer: Self-pay | Admitting: *Deleted

## 2013-07-20 NOTE — Telephone Encounter (Signed)
I recommend that he pay out of pocket for the medication until his insurance will pay again.  I am not comfortable changing his dose.

## 2013-07-20 NOTE — Telephone Encounter (Signed)
Received call from pt's wife stating pt was just released from behavioral health today and is out of his Klonopin and is not eligible to refill it until 08/05/13 per insurance coverage. She states pt had previously taken too much of his medication and that is why he was in behavioral health. She is concerned that he is going to be without any Klonopin until 08/05/13 and states the pharmacy told her the only way it will go through pt's insurance now is if he has a dose change. She is wanting to know if you can supply pt with a new rx / new dose?  I advised her that I did not think Provider would be able to help with that and pt should contact his therapist. She states they left a message with therapist's office this morning and haven't heard back from anyone yet.  Please advise if there are further instructions.

## 2013-07-20 NOTE — Telephone Encounter (Signed)
Notified pt's wife and she voices understanding but states they do not have the money. Provider's recommendation reinforced.

## 2013-07-22 ENCOUNTER — Ambulatory Visit: Payer: No Typology Code available for payment source | Admitting: Endocrinology

## 2013-07-22 NOTE — Progress Notes (Signed)
Patient Discharge Instructions:  Next Level Care Provider Has Access to the EMR, 07/22/13 Records provided to [facility] via [facility]/Epic access.  Jerelene Redden, 07/22/2013, 3:03 PM

## 2013-07-29 ENCOUNTER — Ambulatory Visit: Payer: No Typology Code available for payment source | Admitting: Family

## 2013-08-05 ENCOUNTER — Ambulatory Visit: Payer: No Typology Code available for payment source | Admitting: Endocrinology

## 2013-08-12 ENCOUNTER — Ambulatory Visit (INDEPENDENT_AMBULATORY_CARE_PROVIDER_SITE_OTHER): Payer: PRIVATE HEALTH INSURANCE | Admitting: Psychiatry

## 2013-08-12 ENCOUNTER — Encounter (HOSPITAL_COMMUNITY): Payer: Self-pay | Admitting: Psychiatry

## 2013-08-12 ENCOUNTER — Telehealth: Payer: Self-pay | Admitting: *Deleted

## 2013-08-12 VITALS — BP 130/80 | HR 100 | Wt 233.0 lb

## 2013-08-12 DIAGNOSIS — F319 Bipolar disorder, unspecified: Secondary | ICD-10-CM

## 2013-08-12 MED ORDER — DULOXETINE HCL 60 MG PO CPEP: 60.0000 mg | ORAL_CAPSULE | Freq: Every day | ORAL | Status: DC

## 2013-08-12 MED ORDER — TRAZODONE HCL 100 MG PO TABS: 100.0000 mg | ORAL_TABLET | Freq: Every day | ORAL | Status: DC

## 2013-08-12 MED ORDER — RISPERIDONE 1 MG PO TBDP: 1.0000 mg | ORAL_TABLET | Freq: Two times a day (BID) | ORAL | Status: DC

## 2013-08-12 MED ORDER — DIVALPROEX SODIUM ER 250 MG PO TB24: 250.0000 mg | ORAL_TABLET | Freq: Every day | ORAL | Status: DC

## 2013-08-12 NOTE — Telephone Encounter (Signed)
I see that he met with his psychiatrist today- Dr. Lolly Mustache.  They should contact Dr. Lolly Mustache for refills of this medication.
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
## 2013-08-12 NOTE — Telephone Encounter (Signed)
Patient's wife called reporting that pt was px Klonopin 2 mg [1] BID and given Rx for 2-wk supply by Behavioral Health/Psychiatrist and they will not give him a new Rx & pt will be out on Monday; requesting a new Rx from PCP, EMR does not show that we have previously px this medication for patient and 09.10.14 phone note shows we declined request for renewing this Rx/SLS Please advise recommendations for this pt/SLS

## 2013-08-12 NOTE — Telephone Encounter (Signed)
Left detailed message on voicemail re: below instructions and to call if further questions but psychiatrist should manage this medication going forward.

## 2013-08-15 ENCOUNTER — Other Ambulatory Visit (HOSPITAL_COMMUNITY): Payer: Self-pay | Admitting: Psychiatry

## 2013-08-15 ENCOUNTER — Ambulatory Visit (INDEPENDENT_AMBULATORY_CARE_PROVIDER_SITE_OTHER): Payer: No Typology Code available for payment source | Admitting: Psychiatry

## 2013-08-15 ENCOUNTER — Encounter (HOSPITAL_COMMUNITY): Payer: Self-pay | Admitting: Psychiatry

## 2013-08-15 ENCOUNTER — Other Ambulatory Visit: Payer: Self-pay | Admitting: Family

## 2013-08-15 VITALS — BP 114/78 | HR 88 | Ht 73.0 in | Wt 234.0 lb

## 2013-08-15 DIAGNOSIS — F419 Anxiety disorder, unspecified: Secondary | ICD-10-CM

## 2013-08-15 DIAGNOSIS — F319 Bipolar disorder, unspecified: Secondary | ICD-10-CM

## 2013-08-15 MED ORDER — CHLORDIAZEPOXIDE HCL 25 MG PO CAPS: ORAL_CAPSULE | ORAL | Status: DC

## 2013-08-15 NOTE — Telephone Encounter (Signed)
Not given by this Clinical research associate
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
## 2013-08-16 ENCOUNTER — Encounter: Payer: Self-pay | Admitting: Endocrinology

## 2013-08-16 ENCOUNTER — Ambulatory Visit (INDEPENDENT_AMBULATORY_CARE_PROVIDER_SITE_OTHER): Payer: No Typology Code available for payment source | Admitting: Endocrinology

## 2013-08-16 ENCOUNTER — Other Ambulatory Visit (INDEPENDENT_AMBULATORY_CARE_PROVIDER_SITE_OTHER): Payer: No Typology Code available for payment source | Admitting: *Deleted

## 2013-08-16 VITALS — BP 118/60 | HR 102 | Temp 98.5°F | Resp 12 | Ht 73.0 in | Wt 236.7 lb

## 2013-08-16 DIAGNOSIS — E785 Hyperlipidemia, unspecified: Secondary | ICD-10-CM

## 2013-08-16 DIAGNOSIS — IMO0001 Reserved for inherently not codable concepts without codable children: Secondary | ICD-10-CM

## 2013-08-16 DIAGNOSIS — Z23 Encounter for immunization: Secondary | ICD-10-CM

## 2013-08-16 DIAGNOSIS — Z8719 Personal history of other diseases of the digestive system: Secondary | ICD-10-CM

## 2013-08-16 NOTE — Progress Notes (Signed)
Reason for Appointment : Consultation for Type 2 Diabetes  History of Present Illness          Diagnosis: Type 2 diabetes mellitus, date of diagnosis: 2005     Past history: He had previously been treated with oral hypoglycemic agents but required insulin in 2009 when he had marked hyperglycemia with ketonuria. Subsequently has been on insulin, continuously since about 2010, usually basal bolus regimen. Also appeared to improve control but using Actoplusmet. However Actos was stopped by his cardiologist because of Edema. In 2011 he was taking only about 30 units of Lantus  Recent history:  He is now returning after over 3 years for reconsultation for poorly controlled diabetes His PCP has increased his Lantus insulin progressively and is taking nearly 3 times as much insulin at bedtime with minimal improvement in his fasting readings which are consistently high. He thinks his A1c is usually over 9% but no records available However he did not bring his monitor for download today   INSULIN regimen is described as:  Lantus 84 hs, same dose for several months, sliding scale NovoLog up to 12 units with meals Glucose monitoring:  done 3-4 times a day         Glucometer:  Accu-Chek       Blood Glucose readings from meter download: readings before breakfast: 220-408, acl about 280-300, acs 150; hs 200      Hypoglycemia frequency: Never.           Self-care: The diet that the patient has been following is: Variable    Meals: 3 meals per day.  avoiding drinks with sugar, trying to eat more salads, last night had [place] food  Physical activity: exercise: No particular program, trying to stay as active as possible         Dietician visit: Most recent: Unknown                 Oral hypoglycemic drugs the patient is taking are: Metformin       Side effects from medications have been:edema from actos  previously  Compliance with the medical regimen: Fair Retinal exam: Most recent: 2014, normal  Lab  Results  Component Value Date   HGBA1C 9.5* 06/14/2013    Filed Weights   08/16/13 1610  Weight: 236 lb 11.2 oz (107.366 kg)      Medication List       This list is accurate as of: 08/16/13  8:36 AM.  Always use your most recent med list.               albuterol 108 (90 BASE) MCG/ACT inhaler  Commonly known as:  PROVENTIL HFA;VENTOLIN HFA  Inhale 2 puffs into the lungs every 6 (six) hours as needed for wheezing.     azelastine 137 MCG/SPRAY nasal spray  Commonly known as:  ASTELIN  Place 2 sprays into the nose every morning. Use in each nostril as directed for seasonal allergies.     cetirizine 10 MG tablet  Commonly known as:  ZYRTEC  Take 1 tablet (10 mg total) by mouth 2 (two) times daily. For seasonal allergies.     chlordiazePOXIDE 25 MG capsule  Commonly known as:  LIBRIUM  Take 1 tab twice a day for 2 days and than1 tab daily for 3 days     divalproex 250 MG 24 hr tablet  Commonly known as:  DEPAKOTE ER  Take 1 tablet (250 mg total) by mouth at bedtime.  DULoxetine 60 MG capsule  Commonly known as:  CYMBALTA  Take 1 capsule (60 mg total) by mouth daily. For anxiety and depression.     insulin aspart 100 UNIT/ML injection  Commonly known as:  novoLOG  - Inject 2-12 Units into the skin 3 (three) times daily with meals. Per sliding scale  - < 150 0 units  - 150-200 2 units  - 201-250 4 units  - 251-300 6 units  - 301-350 8 units  - 351-400 10 units  - >400 12 units     Insulin Glargine 100 UNIT/ML Sopn  Commonly known as:  LANTUS SOLOSTAR  Inject 84 Units into the skin at bedtime. For glycemic control of type 2 DM.     metFORMIN 1000 MG tablet  Commonly known as:  GLUCOPHAGE  Take 1 tablet (1,000 mg total) by mouth 2 (two) times daily with a meal. For type two DM.     methocarbamol 750 MG tablet  Commonly known as:  ROBAXIN  Take 1 tablet (750 mg total) by mouth 3 (three) times daily. For muscle spasms.     nabumetone 750 MG tablet   Commonly known as:  RELAFEN  Take 1 tablet (750 mg total) by mouth 2 (two) times daily.     niacin 1000 MG CR tablet  Commonly known as:  NIASPAN  Take 1 tablet (1,000 mg total) by mouth daily. For lipid control.     omega-3 acid ethyl esters 1 G capsule  Commonly known as:  LOVAZA  Take 4 capsules (4 g total) by mouth at bedtime. For hyperlipidemia     risperiDONE 1 MG disintegrating tablet  Commonly known as:  RISPERDAL M-TABS  Take 1 tablet (1 mg total) by mouth 2 (two) times daily. For mood stabilization and psychosis.     simvastatin 20 MG tablet  Commonly known as:  ZOCOR  Take 1 tablet (20 mg total) by mouth daily. For hyperlipidemia.     tadalafil 5 MG tablet  Commonly known as:  CIALIS  Take 1 tablet (5 mg total) by mouth daily. For erectile dysfunction.     Tapentadol HCl 250 MG Tb12  Take 250 mg by mouth 2 (two) times daily. For pain.     traZODone 100 MG tablet  Commonly known as:  DESYREL  Take 1 tablet (100 mg total) by mouth at bedtime. For insomnia.        Allergies:  Allergies  Allergen Reactions  . Ciprofloxacin Hives    Past Medical History  Diagnosis Date  . Allergy   . Asthma   . Depression   . GERD (gastroesophageal reflux disease)   . Hypertension   . Hyperlipidemia   . Chronic pain disorder     Sees [facility]  . Urinary incontinence     detrusor instability  . Hypogonadism   . Diabetes mellitus     Type II    Past Surgical History  Procedure Laterality Date  . Appendectomy    . Hernia repair    . Nasal sinus surgery  2008    Family History  Problem Relation Age of Onset  . Diabetes Mother   . Hypertension Mother   . Cirrhosis Mother   . Depression Mother   . Bipolar disorder Mother   . Arthritis Father 49    osteoarthritis  . Bipolar disorder Father   . Diabetes Sister     borderline  . Heart disease Paternal Uncle 51    MI  .  Heart disease Maternal Grandfather     late 60's--MI  . Cancer Paternal  Grandmother 18    lung  . Bipolar disorder Paternal Grandmother   . Heart disease Paternal Grandfather 53    MI  . Bipolar disorder Paternal Grandfather     Social History:  reports that he has been smoking Cigarettes.  He has been smoking about 0.50 packs per day. He has never used smokeless tobacco. He reports that he does not drink alcohol or use illicit drugs.    Review of Systems       Lipids: Long-standing history of hyperlipidemia, primarily high triglycerides, no recent results available. On treatment with niacin and simvastatin      No unusual headaches.                  Skin: No rash or infections     Thyroid:  No  unusual fatigue.     The blood pressure has been normal without treatment     No swelling of feet.     No shortness of breath on exertion. History of asthma/COPD     Bowel habits: Normal.       No frequency of urination or nocturia       Has had muscle and joint  pains, long-standing, now improved.    He has long-standing depression, has been on Risperdal twice a day for the last month; this was started during his recent hospitalization. On Cymbalta for quite some time also.         Has  Numbness, tingling or burning in  feet  Was on lyrica previously but this was for fibromyalgia    Physical Examination:  BP 118/60  Pulse 102  Temp(Src) 98.5 F (36.9 C)  Resp 12  Ht 6\' 1"  (1.854 m)  Wt 236 lb 11.2 oz (107.366 kg)  BMI 31.24 kg/m2  SpO2 97%  GENERAL:         Patient has generalized obesity.   HEENT:         Eye exam shows normal external appearance. Fundus exam shows no retinopathy. Oral exam shows normal mucosa .  NECK:         General:  Neck exam shows no lymphadenopathy. Carotids are normal to palpation and no bruit heard. Thyroid is not enlarged and no nodules felt.   LUNGS:         Chest is symmetrical. Lungs are clear to auscultation.Marland Kitchen   HEART:         Heart sounds:  S1 and S2 are normal. No murmurs or clicks heard., no S3 or S4.    ABDOMEN:         General:  There is no distention present. Liver and spleen are not palpable. No other mass or tenderness present.  EXTREMITIES:     There is no edema. No skin lesions present.Marland Kitchen  NEUROLOGICAL:        Vibration sense is moderately reduced in toes. Ankle jerks are 2+ bilaterally.          Diabetic foot exam:  as in the foot exam section MUSCULOSKELETAL:       There is no enlargement or deformity of the joints. Spine is normal to inspection.Marland Kitchen   PEDAL pulses: SKIN:       No rash or lesions of concern.        ASSESSMENT:  Diabetes type 2, uncontrolled - 250.02    He has had long-standing diabetes and appears to be more difficult  to control in the last couple of years even though his weight is relatively better. His highest blood sugars appear to be fasting but did not bring his monitor for review today Probably because of insulin resistance he is requiring a large amount of basal insulin compared to before and his fasting readings are still markedly increased. This is despite taking maximum dose metformin He appears to have better readings later in the day but is still taking only small amounts of mealtime coverage His diet seems fairly good but will need to review He is a little active but can do better with exercise  Complications: Has minimal neuropathy, not clearly symptomatic  HYPERLIPIDEMIA: He has had diabetic dyslipidemia with high triglycerides in the past.  No recent labs available, he appears to be doing fairly well with current regimen of niacin and simvastatin.  Will get reports of previous labs and also review previous records when available  PLAN:   Will give him a trial of NPH insulin 20 units at bedtime to help control overnight hyperglycemia while reducing his Lantus to 70. Discussed needing to take a basic amount of mealtime coverage with every meal of NovoLog and higher doses for high readings Needs more consistent exercise Since he had previously  benefited from Actos previously will consider trying this along with diuretics to counteract potential edema Also may be a candidate for Victoza which would help with reduced hepatic glucose output May consider reducing or stopping niacin his triglycerides and HDL were fairly good  He will start monitoring his blood sugars after meals also and bring his monitor for review, discussed when to check the blood sugars, blood sugar targets, actions of different types of insulins and adjustment Most likely will need further education with meal planning  Counseling time over 50% of today's 55 minute visit   [name] 08/16/2013, 8:36 AM

## 2013-08-16 NOTE — Patient Instructions (Addendum)
Humulin N 20 at bedtime for 3 days and go up or down 2 units till am sugar < 150  Novolog 5 units at each meal Plus SS dose  Lantus 70 at night  Please check blood sugars at least half the time about 2 hours after any meal and as directed on waking up. Please bring blood sugar monitor to each visit                               Lantus 70 and start 20 units of NPH at bedtime and increase the dose as directed every 3 days until morning sugar less than 150  Take at least 5 units of NovoLog with every meal and additional doses for high readings  Please check blood sugars at least half the time about 2 hours after any meal and as directed on waking up. Please bring blood sugar monitor to each visit  Start walking regularly

## 2013-08-17 ENCOUNTER — Telehealth: Payer: Self-pay | Admitting: *Deleted

## 2013-08-17 ENCOUNTER — Encounter: Payer: Self-pay | Admitting: Family

## 2013-08-17 ENCOUNTER — Ambulatory Visit (INDEPENDENT_AMBULATORY_CARE_PROVIDER_SITE_OTHER): Payer: No Typology Code available for payment source | Admitting: Family

## 2013-08-17 VITALS — BP 124/86 | HR 94 | Temp 97.7°F | Resp 16 | Ht 72.0 in | Wt 240.0 lb

## 2013-08-17 DIAGNOSIS — S6991XS Unspecified injury of right wrist, hand and finger(s), sequela: Secondary | ICD-10-CM

## 2013-08-17 DIAGNOSIS — E119 Type 2 diabetes mellitus without complications: Secondary | ICD-10-CM

## 2013-08-17 NOTE — Telephone Encounter (Signed)
Pt states he is not able to return to his previous orthopedic specialist because of financial reasons and is requesting referral to a different group as he states he has not had his broken hand fixed yet. Please advise.

## 2013-08-17 NOTE — Telephone Encounter (Signed)
Notified pt and he voices understanding. 

## 2013-08-17 NOTE — Telephone Encounter (Signed)
Lets start with a follow up x ray please. I have placed x ray order.  Will initiate new referral after I see x ray.

## 2013-08-18 ENCOUNTER — Ambulatory Visit (HOSPITAL_BASED_OUTPATIENT_CLINIC_OR_DEPARTMENT_OTHER)
Admission: RE | Admit: 2013-08-18 | Discharge: 2013-08-18 | Disposition: A | Discharge status: Home / Self Care | Payer: No Typology Code available for payment source | Source: Ambulatory Visit | Attending: Family | Admitting: Family

## 2013-08-18 DIAGNOSIS — S6991XS Unspecified injury of right wrist, hand and finger(s), sequela: Secondary | ICD-10-CM

## 2013-08-18 DIAGNOSIS — M21839 Other specified acquired deformities of unspecified forearm: Secondary | ICD-10-CM | POA: Insufficient documentation

## 2013-08-18 DIAGNOSIS — M79609 Pain in unspecified limb: Secondary | ICD-10-CM | POA: Insufficient documentation

## 2013-08-18 NOTE — Progress Notes (Signed)
  Subjective:    Patient ID: [name], male    DOB: 06/19/1976, 37 y.o.   MRN: [id number]  HPI    Review of Systems     Objective:   Physical Exam        Assessment & Plan:  Pt left without being seen.  Chose to reschedule.

## 2013-08-19 ENCOUNTER — Telehealth: Payer: Self-pay | Admitting: Endocrinology

## 2013-08-19 ENCOUNTER — Telehealth: Payer: Self-pay | Admitting: Family

## 2013-08-19 DIAGNOSIS — S6290XS Unspecified fracture of unspecified wrist and hand, sequela: Secondary | ICD-10-CM

## 2013-08-19 MED ORDER — INSULIN NPH (HUMAN) (ISOPHANE) 100 UNIT/ML ~~LOC~~ SUSP: 20.0000 [IU] | Freq: Every day | SUBCUTANEOUS | Status: DC

## 2013-08-19 NOTE — Telephone Encounter (Signed)
Please let pt know that hand fracture is unchanged. Will place referral to new orthopedist.

## 2013-08-19 NOTE — Telephone Encounter (Signed)
rx sent

## 2013-08-19 NOTE — Telephone Encounter (Signed)
Notified pt and he voices understanding. 

## 2013-08-23 ENCOUNTER — Other Ambulatory Visit (HOSPITAL_COMMUNITY): Payer: Self-pay | Admitting: Physician Assistant

## 2013-08-28 ENCOUNTER — Emergency Department (EMERGENCY_DEPARTMENT_HOSPITAL)
Admission: EM | Admit: 2013-08-28 | Discharge: 2013-08-29 | Disposition: A | Discharge status: Other Acute Inpatient Hospital | Payer: Medicare (Managed Care) | Source: Home / Self Care

## 2013-08-28 ENCOUNTER — Encounter (HOSPITAL_COMMUNITY): Payer: Self-pay | Admitting: Emergency Medicine

## 2013-08-28 DIAGNOSIS — G479 Sleep disorder, unspecified: Secondary | ICD-10-CM | POA: Insufficient documentation

## 2013-08-28 DIAGNOSIS — F29 Unspecified psychosis not due to a substance or known physiological condition: Secondary | ICD-10-CM | POA: Insufficient documentation

## 2013-08-28 DIAGNOSIS — F3289 Other specified depressive episodes: Secondary | ICD-10-CM | POA: Insufficient documentation

## 2013-08-28 DIAGNOSIS — E119 Type 2 diabetes mellitus without complications: Secondary | ICD-10-CM | POA: Diagnosis present

## 2013-08-28 DIAGNOSIS — K219 Gastro-esophageal reflux disease without esophagitis: Secondary | ICD-10-CM | POA: Insufficient documentation

## 2013-08-28 DIAGNOSIS — R6883 Chills (without fever): Secondary | ICD-10-CM | POA: Insufficient documentation

## 2013-08-28 DIAGNOSIS — R197 Diarrhea, unspecified: Secondary | ICD-10-CM | POA: Insufficient documentation

## 2013-08-28 DIAGNOSIS — E785 Hyperlipidemia, unspecified: Secondary | ICD-10-CM | POA: Insufficient documentation

## 2013-08-28 DIAGNOSIS — G894 Chronic pain syndrome: Secondary | ICD-10-CM | POA: Insufficient documentation

## 2013-08-28 DIAGNOSIS — Z79899 Other long term (current) drug therapy: Secondary | ICD-10-CM | POA: Insufficient documentation

## 2013-08-28 DIAGNOSIS — E860 Dehydration: Secondary | ICD-10-CM

## 2013-08-28 DIAGNOSIS — F333 Major depressive disorder, recurrent, severe with psychotic symptoms: Principal | ICD-10-CM | POA: Diagnosis present

## 2013-08-28 DIAGNOSIS — F17200 Nicotine dependence, unspecified, uncomplicated: Secondary | ICD-10-CM | POA: Insufficient documentation

## 2013-08-28 DIAGNOSIS — F332 Major depressive disorder, recurrent severe without psychotic features: Secondary | ICD-10-CM

## 2013-08-28 DIAGNOSIS — R630 Anorexia: Secondary | ICD-10-CM | POA: Insufficient documentation

## 2013-08-28 DIAGNOSIS — R45851 Suicidal ideations: Secondary | ICD-10-CM

## 2013-08-28 DIAGNOSIS — F329 Major depressive disorder, single episode, unspecified: Secondary | ICD-10-CM

## 2013-08-28 DIAGNOSIS — Z789 Other specified health status: Secondary | ICD-10-CM

## 2013-08-28 DIAGNOSIS — I10 Essential (primary) hypertension: Secondary | ICD-10-CM | POA: Diagnosis present

## 2013-08-28 DIAGNOSIS — R441 Visual hallucinations: Secondary | ICD-10-CM

## 2013-08-28 DIAGNOSIS — F32A Depression, unspecified: Secondary | ICD-10-CM

## 2013-08-28 DIAGNOSIS — F411 Generalized anxiety disorder: Secondary | ICD-10-CM | POA: Diagnosis present

## 2013-08-28 DIAGNOSIS — J45909 Unspecified asthma, uncomplicated: Secondary | ICD-10-CM | POA: Diagnosis present

## 2013-08-28 DIAGNOSIS — K529 Noninfective gastroenteritis and colitis, unspecified: Secondary | ICD-10-CM | POA: Diagnosis present

## 2013-08-28 DIAGNOSIS — E441 Mild protein-calorie malnutrition: Secondary | ICD-10-CM

## 2013-08-28 DIAGNOSIS — Z794 Long term (current) use of insulin: Secondary | ICD-10-CM | POA: Insufficient documentation

## 2013-08-28 DIAGNOSIS — R000 Tachycardia, unspecified: Secondary | ICD-10-CM | POA: Insufficient documentation

## 2013-08-28 DIAGNOSIS — F419 Anxiety disorder, unspecified: Secondary | ICD-10-CM

## 2013-08-28 DIAGNOSIS — H53149 Visual discomfort, unspecified: Secondary | ICD-10-CM | POA: Insufficient documentation

## 2013-08-28 DIAGNOSIS — Z862 Personal history of diseases of the blood and blood-forming organs and certain disorders involving the immune mechanism: Secondary | ICD-10-CM | POA: Insufficient documentation

## 2013-08-28 DIAGNOSIS — Z8639 Personal history of other endocrine, nutritional and metabolic disease: Secondary | ICD-10-CM | POA: Insufficient documentation

## 2013-08-28 DIAGNOSIS — F319 Bipolar disorder, unspecified: Secondary | ICD-10-CM

## 2013-08-28 DIAGNOSIS — R1110 Vomiting, unspecified: Secondary | ICD-10-CM | POA: Insufficient documentation

## 2013-08-28 LAB — URINALYSIS, ROUTINE W REFLEX MICROSCOPIC
Glucose, UA: NEGATIVE mg/dL
Ketones, ur: 40 mg/dL — AB
Leukocytes, UA: NEGATIVE
Protein, ur: NEGATIVE mg/dL
Urobilinogen, UA: 0.2 mg/dL (ref 0.0–1.0)

## 2013-08-28 LAB — COMPREHENSIVE METABOLIC PANEL
Albumin: 4.7 g/dL (ref 3.5–5.2)
Alkaline Phosphatase: 98 U/L (ref 39–117)
BUN: 3 mg/dL — ABNORMAL LOW (ref 6–23)
CO2: 21 mEq/L (ref 19–32)
Chloride: 89 mEq/L — ABNORMAL LOW (ref 96–112)
Creatinine, Ser: 0.56 mg/dL (ref 0.50–1.35)
GFR calc Af Amer: >90 mL/min (ref >90)
GFR calc non Af Amer: >90 mL/min (ref >90)
Glucose, Bld: 153 mg/dL — ABNORMAL HIGH (ref 70–99)
Potassium: 3.6 mEq/L (ref 3.5–5.1)
Total Bilirubin: 0.4 mg/dL (ref 0.3–1.2)

## 2013-08-28 LAB — CBC
HCT: 42.4 % (ref 39.0–52.0)
Hemoglobin: 15.7 g/dL (ref 13.0–17.0)
MCV: 84.3 fL (ref 78.0–100.0)
RBC: 5.03 MIL/uL (ref 4.22–5.81)
WBC: 10.9 10*3/uL — ABNORMAL HIGH (ref 4.0–10.5)

## 2013-08-28 LAB — RAPID URINE DRUG SCREEN, HOSP PERFORMED
Amphetamines: NOT DETECTED
Opiates: NOT DETECTED

## 2013-08-28 LAB — SALICYLATE LEVEL: Salicylate Lvl: <2 mg/dL — ABNORMAL LOW (ref 2.8–20.0)

## 2013-08-28 LAB — VALPROIC ACID LEVEL: Valproic Acid Lvl: 23.9 ug/mL — ABNORMAL LOW (ref 50.0–100.0)

## 2013-08-28 LAB — POCT I-STAT TROPONIN I: Troponin i, poc: 0.02 ng/mL (ref 0.00–0.08)

## 2013-08-28 LAB — GLUCOSE, CAPILLARY

## 2013-08-28 LAB — ETHANOL: Alcohol, Ethyl (B): <11 mg/dL (ref 0–11)

## 2013-08-28 MED ORDER — INSULIN ASPART 100 UNIT/ML ~~LOC~~ SOLN
5.0000 [IU] | Freq: Three times a day (TID) | SUBCUTANEOUS | Status: DC
  Administered 2013-08-29: 5 [IU] via SUBCUTANEOUS

## 2013-08-28 MED ORDER — DIVALPROEX SODIUM ER 500 MG PO TB24
500.0000 mg | ORAL_TABLET | Freq: Every day | ORAL | Status: DC
  Administered 2013-08-28: 500 mg via ORAL
  Filled 2013-08-28 (×2): qty 1

## 2013-08-28 MED ORDER — INSULIN GLARGINE 100 UNIT/ML ~~LOC~~ SOLN
70.0000 [IU] | Freq: Every day | SUBCUTANEOUS | Status: DC
  Administered 2013-08-28: 70 [IU] via SUBCUTANEOUS
  Filled 2013-08-28 (×2): qty 0.7

## 2013-08-28 MED ORDER — SODIUM CHLORIDE 0.9 % IV BOLUS (SEPSIS)
2000.0000 mL | Freq: Once | INTRAVENOUS | Status: AC
  Administered 2013-08-28: 1000 mL via INTRAVENOUS

## 2013-08-28 MED ORDER — INSULIN NPH (HUMAN) (ISOPHANE) 100 UNIT/ML ~~LOC~~ SUSP
20.0000 [IU] | Freq: Every day | SUBCUTANEOUS | Status: DC
  Administered 2013-08-28: 20 [IU] via SUBCUTANEOUS
  Filled 2013-08-28: qty 10

## 2013-08-28 MED ORDER — ALBUTEROL SULFATE HFA 108 (90 BASE) MCG/ACT IN AERS: 2.0000 | INHALATION_SPRAY | Freq: Four times a day (QID) | RESPIRATORY_TRACT | Status: DC | PRN

## 2013-08-28 MED ORDER — NIACIN ER (ANTIHYPERLIPIDEMIC) 500 MG PO TBCR
1000.0000 mg | EXTENDED_RELEASE_TABLET | Freq: Every day | ORAL | Status: DC
  Administered 2013-08-28: 1000 mg via ORAL
  Filled 2013-08-28 (×2): qty 2

## 2013-08-28 MED ORDER — OMEGA-3-ACID ETHYL ESTERS 1 G PO CAPS
4.0000 g | ORAL_CAPSULE | Freq: Every day | ORAL | Status: DC
  Administered 2013-08-28: 4 g via ORAL
  Filled 2013-08-28 (×2): qty 4

## 2013-08-28 MED ORDER — SODIUM CHLORIDE 0.9 % IV BOLUS (SEPSIS): 1000.0000 mL | Freq: Once | INTRAVENOUS | Status: DC

## 2013-08-28 MED ORDER — ONDANSETRON HCL 4 MG PO TABS: 4.0000 mg | ORAL_TABLET | Freq: Three times a day (TID) | ORAL | Status: DC | PRN

## 2013-08-28 MED ORDER — NICOTINE 21 MG/24HR TD PT24
21.0000 mg | MEDICATED_PATCH | Freq: Every day | TRANSDERMAL | Status: DC
  Administered 2013-08-28 – 2013-08-29 (×2): 21 mg via TRANSDERMAL
  Filled 2013-08-28 (×2): qty 1

## 2013-08-28 MED ORDER — METHOCARBAMOL 500 MG PO TABS
750.0000 mg | ORAL_TABLET | Freq: Three times a day (TID) | ORAL | Status: DC
  Administered 2013-08-28: 500 mg via ORAL
  Administered 2013-08-29 (×2): 750 mg via ORAL
  Filled 2013-08-28 (×3): qty 2

## 2013-08-28 MED ORDER — LORATADINE 10 MG PO TABS
10.0000 mg | ORAL_TABLET | Freq: Every day | ORAL | Status: DC
  Administered 2013-08-29: 10 mg via ORAL
  Filled 2013-08-28: qty 1

## 2013-08-28 MED ORDER — TAPENTADOL HCL ER 250 MG PO TB12: 250.0000 mg | ORAL_TABLET | Freq: Two times a day (BID) | ORAL | Status: DC

## 2013-08-28 MED ORDER — METFORMIN HCL 500 MG PO TABS
1000.0000 mg | ORAL_TABLET | Freq: Two times a day (BID) | ORAL | Status: DC
  Administered 2013-08-29: 1000 mg via ORAL
  Filled 2013-08-28 (×3): qty 2

## 2013-08-28 MED ORDER — DULOXETINE HCL 60 MG PO CPEP
60.0000 mg | ORAL_CAPSULE | Freq: Every day | ORAL | Status: DC
  Administered 2013-08-29: 60 mg via ORAL
  Filled 2013-08-28: qty 1

## 2013-08-28 MED ORDER — SIMVASTATIN 20 MG PO TABS
20.0000 mg | ORAL_TABLET | Freq: Every day | ORAL | Status: DC
  Administered 2013-08-28: 20 mg via ORAL
  Filled 2013-08-28 (×2): qty 1

## 2013-08-28 MED ORDER — LORAZEPAM 1 MG PO TABS
1.0000 mg | ORAL_TABLET | Freq: Three times a day (TID) | ORAL | Status: DC | PRN
  Administered 2013-08-29: 1 mg via ORAL
  Filled 2013-08-28 (×2): qty 1

## 2013-08-28 MED ORDER — TADALAFIL 5 MG PO TABS
5.0000 mg | ORAL_TABLET | Freq: Every day | ORAL | Status: DC
  Filled 2013-08-28: qty 1

## 2013-08-28 MED ORDER — TRAZODONE HCL 100 MG PO TABS
100.0000 mg | ORAL_TABLET | Freq: Every day | ORAL | Status: DC
  Administered 2013-08-28: 100 mg via ORAL
  Filled 2013-08-28: qty 1

## 2013-08-28 MED ORDER — ZOLPIDEM TARTRATE 5 MG PO TABS: 5.0000 mg | ORAL_TABLET | Freq: Every evening | ORAL | Status: DC | PRN

## 2013-08-28 MED ORDER — IBUPROFEN 200 MG PO TABS: 600.0000 mg | ORAL_TABLET | Freq: Three times a day (TID) | ORAL | Status: DC | PRN

## 2013-08-28 MED ORDER — SODIUM CHLORIDE 0.9 % IV BOLUS (SEPSIS): 2000.0000 mL | Freq: Once | INTRAVENOUS | Status: DC

## 2013-08-28 MED ORDER — RISPERIDONE 1 MG PO TBDP
1.0000 mg | ORAL_TABLET | Freq: Two times a day (BID) | ORAL | Status: DC
  Administered 2013-08-28 – 2013-08-29 (×2): 1 mg via ORAL
  Filled 2013-08-28 (×3): qty 1

## 2013-08-28 MED ORDER — NABUMETONE 750 MG PO TABS
750.0000 mg | ORAL_TABLET | Freq: Two times a day (BID) | ORAL | Status: DC
  Administered 2013-08-28 – 2013-08-29 (×2): 750 mg via ORAL
  Filled 2013-08-28 (×3): qty 1
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
## 2013-08-28 NOTE — Consult Note (Signed)
  Pt seen by TTS and myself he is physically sick with dehydration from N&V and diarrhea which I believe is interfering with his medications to the point were he questions if the increase in Depakote has caused an increase in his racing thoughts. The etiology of his gastroenetritis is unkown at this point.He was initially c/o of suicidal thought but this has stopped.He had requested to go home and keep his appt with Dr Lolly Mustache tomorrow but his medical condition is such that he will be kept overnite and IV fluids will be started (nurse came to do same we were leaving his room). Recommend he be seen in ED tomorrow-I would not admit him as long as he continues to have Diarrhea /N&V but he may feel immensely better with fluids and if he can keep his medications down. If he clears medically ( not contagious)before he can be seen he can be transferred to the psych ED until he can be reassessed.   Pt moved to Psych ED.Having trouble with anxiety-medications were changed recently he reported earlier from klonopin to librium and depakote.Has standing order in ED for ativan-no listing of librium on his medication list.The last 3 days it is doubtful he has gotten much medication with his gastroeneteritis.His diabetes complicates things as well but his sugar was normal with a weak spell he had earlier.Suspect he is weak from lack of nutrition as well.Encouraged to rest and recuperate.Ativan given by nursing  Pt has given hx of Chronic pain and requested Opiates but review of his Rx at [facility] reveal no rx for narcotic pain meds and home medications also show only rx for relafen.UDS was negative for opiates so he is not likely to be suffering from opiate W/D at this time

## 2013-08-28 NOTE — ED Provider Notes (Signed)
CSN: [id number]     Arrival date & time 08/28/13  1802 History   First MD Initiated Contact with Patient 08/28/13 1803     This chart was scribed for non-physician practitioner, Raymon Mutton PA-C, working with Lyanne Co, MD by Arlan Organ, ED Scribe. This patient was seen in room [facility]4/[facility]PT4 and the patient's care was started at 6:06 PM.   No chief complaint on file.  The history is provided by the patient. No language interpreter was used.   HPI Comments: [name] is a 37 y.o. male who presents to the Emergency Department seeking medical clearance today. Pt states his Klonopin was stopped 10/16. He states ever since then, he has been feeling bad, and experiencing paranoia, suicidal thoughts, increased depression, decreased concentration, and change in appetite. He also reports associated emesis, visual and audio hallucinations, numbness and tingling all over. He states he had an episode of emesis first thing this morning, which he describes as liquid. He states the suicidal thoughts are intermittent. He says when the thoughts come, they are severe. Pt states he does not have a plan, but does have frequent thoughts. He states he does have access to fire arms. Pt states he is taking his medication for his diabetes regularly.  Pt denies SOB, CP. Pt denies alcohol and drug use. He states he currently lives with his wife and daughter.  PCP Sandford Craze NP. Pt states the last time he saw her was 3 weeks ago. Past Medical History  Diagnosis Date  . Allergy   . Asthma   . Depression   . GERD (gastroesophageal reflux disease)   . Hypertension   . Hyperlipidemia   . Chronic pain disorder     Sees [facility]  . Urinary incontinence     detrusor instability  . Hypogonadism   . Diabetes mellitus     Type II   Past Surgical History  Procedure Laterality Date  . Appendectomy    . Hernia repair    . Nasal sinus surgery  2008   Family History  Problem Relation  Age of Onset  . Diabetes Mother   . Hypertension Mother   . Cirrhosis Mother   . Depression Mother   . Bipolar disorder Mother   . Arthritis Father 49    osteoarthritis  . Bipolar disorder Father   . Diabetes Sister     borderline  . Heart disease Paternal Uncle 18    MI  . Heart disease Maternal Grandfather     late 60's--MI  . Cancer Paternal Grandmother 21    lung  . Bipolar disorder Paternal Grandmother   . Heart disease Paternal Grandfather 75    MI  . Bipolar disorder Paternal Grandfather    History  Substance Use Topics  . Smoking status: Current Every Day Smoker -- 0.50 packs/day    Types: Cigarettes  . Smokeless tobacco: Never Used  . Alcohol Use: No    Review of Systems  Constitutional: Positive for chills and appetite change. Negative for fever.  Cardiovascular: Negative for chest pain.  Gastrointestinal: Positive for vomiting and diarrhea.  Psychiatric/Behavioral: Positive for suicidal ideas, hallucinations, confusion and sleep disturbance.  All other systems reviewed and are negative.    Allergies  Ciprofloxacin  Home Medications   Current Outpatient Rx  Name  Route  Sig  Dispense  Refill  . albuterol (PROVENTIL HFA;VENTOLIN HFA) 108 (90 BASE) MCG/ACT inhaler   Inhalation   Inhale 2 puffs into the  lungs every 6 (six) hours as needed for wheezing.         . cetirizine (ZYRTEC) 10 MG tablet   Oral   Take 1 tablet (10 mg total) by mouth 2 (two) times daily. For seasonal allergies.   60 tablet   5   . divalproex (DEPAKOTE ER) 250 MG 24 hr tablet   Oral   Take 500 mg by mouth at bedtime.         . DULoxetine (CYMBALTA) 60 MG capsule   Oral   Take 1 capsule (60 mg total) by mouth daily. For anxiety and depression.   30 capsule   0   . insulin aspart (NOVOLOG) 100 UNIT/ML injection   Subcutaneous   Inject 5 Units into the skin 3 (three) times daily with meals. Per sliding scale < 150 0 units 150-200 2 units 201-250 4 units 251-300 6  units 301-350 8 units 351-400 10 units >400 12 units         . Insulin Glargine 100 UNIT/ML SOPN   Subcutaneous   Inject 70 Units into the skin at bedtime. For glycemic control of type 2 DM.         Marland Kitchen insulin NPH (HUMULIN N,NOVOLIN N) 100 UNIT/ML injection   Subcutaneous   Inject 20 Units into the skin at bedtime.   1 vial   5   . metFORMIN (GLUCOPHAGE) 1000 MG tablet   Oral   Take 1 tablet (1,000 mg total) by mouth 2 (two) times daily with a meal. For type two DM.         . methocarbamol (ROBAXIN) 750 MG tablet   Oral   Take 1 tablet (750 mg total) by mouth 3 (three) times daily. For muscle spasms.         . nabumetone (RELAFEN) 750 MG tablet   Oral   Take 1 tablet (750 mg total) by mouth 2 (two) times daily.         . niacin (NIASPAN) 1000 MG CR tablet   Oral   Take 1 tablet (1,000 mg total) by mouth daily. For lipid control.   30 tablet   2   . omega-3 acid ethyl esters (LOVAZA) 1 G capsule   Oral   Take 4 capsules (4 g total) by mouth at bedtime. For hyperlipidemia         . ondansetron (ZOFRAN) 4 MG tablet      TAKE 1 TABLET (4 MG TOTAL) BY MOUTH EVERY 8 (EIGHT) HOURS AS NEEDED FOR NAUSEA.   20 tablet   0   . risperiDONE (RISPERDAL M-TABS) 1 MG disintegrating tablet   Oral   Take 1 tablet (1 mg total) by mouth 2 (two) times daily. For mood stabilization and psychosis.   60 tablet   0   . simvastatin (ZOCOR) 20 MG tablet   Oral   Take 1 tablet (20 mg total) by mouth daily. For hyperlipidemia.   30 tablet   2   . tadalafil (CIALIS) 5 MG tablet   Oral   Take 1 tablet (5 mg total) by mouth daily. For erectile dysfunction.   10 tablet   0   . Tapentadol HCl 250 MG TB12   Oral   Take 250 mg by mouth 2 (two) times daily. For pain.      0   . traZODone (DESYREL) 100 MG tablet   Oral   Take 1 tablet (100 mg total) by mouth at bedtime. For insomnia.  30 tablet   0    BP 154/93  Pulse 110  Temp(Src) 97.8 F (36.6 C) (Oral)  SpO2  96% Physical Exam  Nursing note and vitals reviewed. Constitutional: He is oriented to person, place, and time. He appears well-developed and well-nourished. No distress.  HENT:  Head: Normocephalic and atraumatic.  Mouth/Throat: Oropharynx is clear and moist. No oropharyngeal exudate.  Eyes: Conjunctivae and EOM are normal. Pupils are equal, round, and reactive to light. Right eye exhibits no discharge. Left eye exhibits no discharge.  Neck: Normal range of motion. Neck supple.  Negative neck stiffness Negative nuchal rigidity Negative cervical lymphadenopathy Negative pain upon palpation to c-spine Negative meningeal signs  Cardiovascular: Normal rate and normal heart sounds.   Pulses:      Radial pulses are 2+ on the right side, and 2+ on the left side.       Dorsalis pedis pulses are 2+ on the right side, and 2+ on the left side.  Tachycardia auscultated  Pulmonary/Chest: Effort normal and breath sounds normal. No respiratory distress. He has no wheezes. He has no rales.  Abdominal: Soft. Bowel sounds are normal. He exhibits no distension. There is no tenderness. There is no guarding.  Lymphadenopathy:    He has no cervical adenopathy.  Neurological: He is alert and oriented to person, place, and time. He exhibits normal muscle tone. Coordination normal.  Skin: Skin is warm and dry. No rash noted. He is not diaphoretic. No erythema.  Negative signs of self injury  Psychiatric:  Flat affect     ED Course  Procedures (including critical care time)  DIAGNOSTIC STUDIES: Oxygen Saturation is 98% on RA, Normal by my interpretation.    COORDINATION OF CARE: 6:05 PM-Discussed treatment plan with pt at bedside and pt agreed to plan.    8:19 PM Spoke with Dr. Haywood Lasso regarding patient, labs, vital signs. Recommended that patient does not need IV fluids, just PO fluids. As per physician, reported that patient can be moved to psych ED.   10:13 PM Spoke with psychiatry - will not  take patient with a sodium of 126. Patient placed on IV fluids.    Labs Review Labs Reviewed  CBC - Abnormal; Notable for the following:    WBC 10.9 (*)    MCHC 37.0 (*)    All other components within normal limits  COMPREHENSIVE METABOLIC PANEL - Abnormal; Notable for the following:    Sodium 126 (*)    Chloride 89 (*)    Glucose, Bld 153 (*)    BUN 3 (*)    All other components within normal limits  URINALYSIS, ROUTINE W REFLEX MICROSCOPIC - Abnormal; Notable for the following:    APPearance CLOUDY (*)    Ketones, ur 40 (*)    All other components within normal limits  SALICYLATE LEVEL - Abnormal; Notable for the following:    Salicylate Lvl <2.0 (*)    All other components within normal limits  VALPROIC ACID LEVEL - Abnormal; Notable for the following:    Valproic Acid Lvl 23.9 (*)    All other components within normal limits  GLUCOSE, CAPILLARY - Abnormal; Notable for the following:    Glucose-Capillary 154 (*)    All other components within normal limits  URINE RAPID DRUG SCREEN (HOSP PERFORMED)  ETHANOL  POCT I-STAT TROPONIN I   Imaging Review No results found.  EKG Interpretation     Ventricular Rate:  106 PR Interval:  161 QRS Duration: 94 QT  Interval:  334 QTC Calculation: 444 R Axis:   -19 Text Interpretation:  Sinus tachycardia Borderline left axis deviation ST elev, probable normal early repol pattern Baseline wander in lead(s) V3 V4 V5 V6 No significant change was found            MDM   1. Depression   2. Suicidal ideation   3. Visual hallucinations   4. DM (diabetes mellitus)    I personally performed the services described in this documentation, which was scribed in my presence. The recorded information has been reviewed and is accurate.  Patient presenting to the ED with increased depression and suicidal ideation. Patient reported that last week his medications were changed - reported that his primary provider discontinued his Klonopin and  placed him on Librium, patient reported that since then he has been feeling down. Reported that he has had SI without a plan, patient does have access to firearms - reported that his father has a gun. Patient reported that he has been feeling, tired, weak, decreased appetite, decreased sleeping patterns. Reported that he has been having visual hallucinations. Denied alcohol, illicit drug use, chest pain, shortness of breath, difficulty breathing.  CBC noted mild elevation in WBC of 10.9, negative leukocytosis noted. CMP noted mild hyponatremia of 126, decrease in chloride of 89 - patient appears to be dehydrated. Glucose 153 - negative signs of DKA. Negative elevation in alcohol and salicylates. Urine mild ketones noted of 40 - negative infection noted, negative nitrites or leukocytes noted. Urine drug screen negative.  Patient dehydrated. Fluids IV. Home medications reordered. Psyched orders placed. Sitter ordered since patient SI. Patient moved to Psych ED.    Raymon Mutton, PA-C 08/29/13 [phone]

## 2013-08-28 NOTE — ED Notes (Signed)
Pt states psychiatrist changed meds about 2 weeks ago and has been feeling bad since, states about 2 hours before Risperdol is due , he has thoughts of suicide, states no plan but he is afraid of what he might do

## 2013-08-29 ENCOUNTER — Encounter (HOSPITAL_COMMUNITY): Payer: Self-pay

## 2013-08-29 ENCOUNTER — Telehealth: Payer: Self-pay | Admitting: Endocrinology

## 2013-08-29 ENCOUNTER — Inpatient Hospital Stay (HOSPITAL_COMMUNITY)
Admission: AD | Admit: 2013-08-29 | Discharge: 2013-09-07 | DRG: 885 | Disposition: A | Discharge status: Home / Self Care | Payer: Medicare (Managed Care) | Source: Intra-hospital | Attending: Psychiatry | Admitting: Psychiatry

## 2013-08-29 DIAGNOSIS — M5137 Other intervertebral disc degeneration, lumbosacral region: Secondary | ICD-10-CM

## 2013-08-29 DIAGNOSIS — J309 Allergic rhinitis, unspecified: Secondary | ICD-10-CM

## 2013-08-29 DIAGNOSIS — F411 Generalized anxiety disorder: Secondary | ICD-10-CM | POA: Diagnosis present

## 2013-08-29 DIAGNOSIS — E119 Type 2 diabetes mellitus without complications: Secondary | ICD-10-CM

## 2013-08-29 DIAGNOSIS — K529 Noninfective gastroenteritis and colitis, unspecified: Secondary | ICD-10-CM

## 2013-08-29 DIAGNOSIS — F419 Anxiety disorder, unspecified: Secondary | ICD-10-CM

## 2013-08-29 DIAGNOSIS — I10 Essential (primary) hypertension: Secondary | ICD-10-CM

## 2013-08-29 DIAGNOSIS — N529 Male erectile dysfunction, unspecified: Secondary | ICD-10-CM

## 2013-08-29 DIAGNOSIS — F332 Major depressive disorder, recurrent severe without psychotic features: Secondary | ICD-10-CM

## 2013-08-29 DIAGNOSIS — F329 Major depressive disorder, single episode, unspecified: Secondary | ICD-10-CM

## 2013-08-29 DIAGNOSIS — M51379 Other intervertebral disc degeneration, lumbosacral region without mention of lumbar back pain or lower extremity pain: Secondary | ICD-10-CM

## 2013-08-29 DIAGNOSIS — F17200 Nicotine dependence, unspecified, uncomplicated: Secondary | ICD-10-CM

## 2013-08-29 DIAGNOSIS — E860 Dehydration: Secondary | ICD-10-CM

## 2013-08-29 DIAGNOSIS — Z8719 Personal history of other diseases of the digestive system: Secondary | ICD-10-CM

## 2013-08-29 DIAGNOSIS — E785 Hyperlipidemia, unspecified: Secondary | ICD-10-CM

## 2013-08-29 DIAGNOSIS — F3289 Other specified depressive episodes: Secondary | ICD-10-CM

## 2013-08-29 DIAGNOSIS — Z789 Other specified health status: Secondary | ICD-10-CM

## 2013-08-29 DIAGNOSIS — M549 Dorsalgia, unspecified: Secondary | ICD-10-CM

## 2013-08-29 DIAGNOSIS — E441 Mild protein-calorie malnutrition: Secondary | ICD-10-CM

## 2013-08-29 DIAGNOSIS — F333 Major depressive disorder, recurrent, severe with psychotic symptoms: Principal | ICD-10-CM

## 2013-08-29 DIAGNOSIS — IMO0001 Reserved for inherently not codable concepts without codable children: Secondary | ICD-10-CM

## 2013-08-29 DIAGNOSIS — R5383 Other fatigue: Secondary | ICD-10-CM

## 2013-08-29 DIAGNOSIS — K219 Gastro-esophageal reflux disease without esophagitis: Secondary | ICD-10-CM

## 2013-08-29 DIAGNOSIS — E291 Testicular hypofunction: Secondary | ICD-10-CM

## 2013-08-29 DIAGNOSIS — J45909 Unspecified asthma, uncomplicated: Secondary | ICD-10-CM

## 2013-08-29 LAB — GLUCOSE, CAPILLARY
Glucose-Capillary: 137 mg/dL — ABNORMAL HIGH (ref 70–99)
Glucose-Capillary: 237 mg/dL — ABNORMAL HIGH (ref 70–99)
Glucose-Capillary: 84 mg/dL (ref 70–99)
Glucose-Capillary: 92 mg/dL (ref 70–99)
Glucose-Capillary: 95 mg/dL (ref 70–99)

## 2013-08-29 MED ORDER — TRAZODONE HCL 100 MG PO TABS
100.0000 mg | ORAL_TABLET | Freq: Every day | ORAL | Status: DC
  Administered 2013-08-29 – 2013-08-30 (×2): 100 mg via ORAL
  Filled 2013-08-29 (×4): qty 1

## 2013-08-29 MED ORDER — RISPERIDONE 1 MG PO TBDP
1.0000 mg | ORAL_TABLET | Freq: Two times a day (BID) | ORAL | Status: DC
  Administered 2013-08-29 – 2013-08-31 (×4): 1 mg via ORAL
  Filled 2013-08-29 (×8): qty 1

## 2013-08-29 MED ORDER — SIMVASTATIN 20 MG PO TABS
20.0000 mg | ORAL_TABLET | Freq: Every day | ORAL | Status: DC
  Administered 2013-08-29 – 2013-09-06 (×9): 20 mg via ORAL
  Filled 2013-08-29 (×12): qty 1

## 2013-08-29 MED ORDER — INSULIN NPH (HUMAN) (ISOPHANE) 100 UNIT/ML ~~LOC~~ SUSP
20.0000 [IU] | Freq: Every day | SUBCUTANEOUS | Status: DC
  Administered 2013-08-30: 20 [IU] via SUBCUTANEOUS

## 2013-08-29 MED ORDER — HYDROXYZINE HCL 25 MG PO TABS
50.0000 mg | ORAL_TABLET | Freq: Four times a day (QID) | ORAL | Status: DC | PRN
  Administered 2013-08-29: 50 mg via ORAL
  Filled 2013-08-29: qty 2

## 2013-08-29 MED ORDER — HYDROXYZINE HCL 50 MG PO TABS
50.0000 mg | ORAL_TABLET | Freq: Four times a day (QID) | ORAL | Status: DC | PRN
  Administered 2013-08-29 – 2013-09-05 (×17): 50 mg via ORAL
  Filled 2013-08-29 (×18): qty 1

## 2013-08-29 MED ORDER — METFORMIN HCL 500 MG PO TABS
1000.0000 mg | ORAL_TABLET | Freq: Two times a day (BID) | ORAL | Status: DC
  Administered 2013-08-30 – 2013-09-07 (×17): 1000 mg via ORAL
  Filled 2013-08-29 (×22): qty 2

## 2013-08-29 MED ORDER — DULOXETINE HCL 60 MG PO CPEP
60.0000 mg | ORAL_CAPSULE | Freq: Every day | ORAL | Status: DC
  Administered 2013-08-30 – 2013-09-07 (×9): 60 mg via ORAL
  Filled 2013-08-29 (×2): qty 1
  Filled 2013-08-29: qty 3
  Filled 2013-08-29 (×9): qty 1

## 2013-08-29 MED ORDER — LOPERAMIDE HCL 2 MG PO CAPS
4.0000 mg | ORAL_CAPSULE | ORAL | Status: DC | PRN
  Administered 2013-08-29: 4 mg via ORAL
  Filled 2013-08-29: qty 2

## 2013-08-29 MED ORDER — INSULIN GLARGINE 100 UNIT/ML ~~LOC~~ SOLN
70.0000 [IU] | Freq: Every day | SUBCUTANEOUS | Status: DC
  Administered 2013-08-30 – 2013-09-01 (×3): 70 [IU] via SUBCUTANEOUS

## 2013-08-29 MED ORDER — LOPERAMIDE HCL 2 MG PO CAPS: 4.0000 mg | ORAL_CAPSULE | ORAL | Status: DC | PRN

## 2013-08-29 MED ORDER — ALUM & MAG HYDROXIDE-SIMETH 200-200-20 MG/5ML PO SUSP
30.0000 mL | ORAL | Status: DC | PRN
  Administered 2013-09-05: 30 mL via ORAL

## 2013-08-29 MED ORDER — INSULIN ASPART 100 UNIT/ML ~~LOC~~ SOLN
5.0000 [IU] | Freq: Three times a day (TID) | SUBCUTANEOUS | Status: DC
  Administered 2013-08-29 – 2013-09-07 (×26): 5 [IU] via SUBCUTANEOUS

## 2013-08-29 MED ORDER — ONDANSETRON HCL 4 MG PO TABS
4.0000 mg | ORAL_TABLET | Freq: Three times a day (TID) | ORAL | Status: DC | PRN
  Administered 2013-09-04 – 2013-09-06 (×2): 4 mg via ORAL
  Filled 2013-08-29 (×2): qty 1

## 2013-08-29 MED ORDER — NABUMETONE 500 MG PO TABS
750.0000 mg | ORAL_TABLET | Freq: Two times a day (BID) | ORAL | Status: DC
  Administered 2013-08-29: 750 mg via ORAL
  Filled 2013-08-29 (×5): qty 2

## 2013-08-29 MED ORDER — METHOCARBAMOL 750 MG PO TABS
750.0000 mg | ORAL_TABLET | Freq: Three times a day (TID) | ORAL | Status: DC
  Administered 2013-08-30 – 2013-09-07 (×25): 750 mg via ORAL
  Filled 2013-08-29 (×31): qty 1

## 2013-08-29 MED ORDER — ACETAMINOPHEN 325 MG PO TABS: 650.0000 mg | ORAL_TABLET | Freq: Four times a day (QID) | ORAL | Status: DC | PRN

## 2013-08-29 MED ORDER — NIACIN ER (ANTIHYPERLIPIDEMIC) 500 MG PO TBCR
1000.0000 mg | EXTENDED_RELEASE_TABLET | Freq: Every day | ORAL | Status: DC
  Administered 2013-08-30: 1000 mg via ORAL
  Filled 2013-08-29 (×2): qty 2

## 2013-08-29 MED ORDER — BUSPIRONE HCL 10 MG PO TABS
10.0000 mg | ORAL_TABLET | Freq: Three times a day (TID) | ORAL | Status: DC
  Administered 2013-08-30 – 2013-08-31 (×3): 10 mg via ORAL
  Filled 2013-08-29 (×7): qty 1

## 2013-08-29 MED ORDER — LORATADINE 10 MG PO TABS
10.0000 mg | ORAL_TABLET | Freq: Every day | ORAL | Status: DC
  Administered 2013-08-30 – 2013-09-07 (×9): 10 mg via ORAL
  Filled 2013-08-29 (×12): qty 1

## 2013-08-29 MED ORDER — MAGNESIUM HYDROXIDE 400 MG/5ML PO SUSP: 30.0000 mL | Freq: Every day | ORAL | Status: DC | PRN

## 2013-08-29 MED ORDER — BUSPIRONE HCL 10 MG PO TABS
10.0000 mg | ORAL_TABLET | Freq: Three times a day (TID) | ORAL | Status: DC
  Administered 2013-08-29 (×2): 10 mg via ORAL
  Filled 2013-08-29 (×2): qty 1

## 2013-08-29 MED ORDER — ALBUTEROL SULFATE HFA 108 (90 BASE) MCG/ACT IN AERS: 2.0000 | INHALATION_SPRAY | Freq: Four times a day (QID) | RESPIRATORY_TRACT | Status: DC | PRN

## 2013-08-29 MED ORDER — AMMONIA AROMATIC IN INHA
RESPIRATORY_TRACT | Status: AC
  Administered 2013-08-29: 04:00:00
  Filled 2013-08-29: qty 10

## 2013-08-29 MED ORDER — NICOTINE 21 MG/24HR TD PT24
21.0000 mg | MEDICATED_PATCH | Freq: Every day | TRANSDERMAL | Status: DC
  Administered 2013-08-30 – 2013-09-07 (×9): 21 mg via TRANSDERMAL
  Filled 2013-08-29 (×11): qty 1

## 2013-08-29 MED ORDER — TAPENTADOL HCL ER 250 MG PO TB12
250.0000 mg | ORAL_TABLET | Freq: Two times a day (BID) | ORAL | Status: DC
  Administered 2013-08-31 – 2013-09-01 (×3): 250 mg via ORAL

## 2013-08-29 MED ORDER — OMEGA-3-ACID ETHYL ESTERS 1 G PO CAPS
4.0000 g | ORAL_CAPSULE | Freq: Every day | ORAL | Status: DC
  Administered 2013-08-29: 4 g via ORAL
  Filled 2013-08-29 (×3): qty 4

## 2013-08-29 MED ORDER — TADALAFIL 5 MG PO TABS: 5.0000 mg | ORAL_TABLET | Freq: Every day | ORAL | Status: DC

## 2013-08-29 NOTE — Progress Notes (Signed)
Patient ID: [name], male   DOB: 11/30/1975, 37 y.o.   MRN: [id number]  Admission Note:  D:36 yr male who presents VC in no acute distress for the treatment of SI and Depression. Pt appears flat and depressed. Pt was calm and cooperative with admission process. Pt presents with passive SI and contracts for safety upon admission. Pt denies AVH . Pt states since being prescribed pain medication he needed Klonopin, and when Dr stopped medication pt began to get paranoid and sick. Pt sees Dr Lolly Mustache on out patient basis.   A:Skin was assessed and found to be clear of any abnormal marks apart from a tattoo on R-calf. POC and unit policies explained and understanding verbalized. Consents obtained. Food and fluids offered, and fluids accepted.  R: Pt had no additional questions or concerns.

## 2013-08-29 NOTE — Tx Team (Signed)
Initial Interdisciplinary Treatment Plan  PATIENT STRENGTHS: (choose at least two) General fund of knowledge  PATIENT STRESSORS: Medication change or noncompliance   PROBLEM LIST: Problem List/Patient Goals Date to be addressed Date deferred Reason deferred Estimated date of resolution  Risk For Suicide 08/29/13     Medication Matinence 08/29/13     Depression 08/29/13                                          DISCHARGE CRITERIA:  Ability to meet basic life and health needs Adequate post-discharge living arrangements Medical problems require only outpatient monitoring  PRELIMINARY DISCHARGE PLAN: Attend aftercare/continuing care group Outpatient therapy  PATIENT/FAMIILY INVOLVEMENT: This treatment plan has been presented to and reviewed with the patient, [name].  The patient and family have been given the opportunity to ask questions and make suggestions.  Jacques Navy A 08/29/2013, 6:04 PM

## 2013-08-29 NOTE — Progress Notes (Signed)
D: Patient in his room on approach.  Patient states he is withdrawing for narcotics.  Patient has not gotten out of bed tonight.  Patient forwards little with Clinical research associate.  Patient denies SI/HI and denies AVH. A: Staff to monitor Q 15 mins for safety.  Encouragement and support offered.  Scheduled medications administered per orders. R: Patient remains safe on the unit.  Patient attended group tonight.  Patient taking administered medications.  Patient not visible on the unit and not interacting with peers.
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
## 2013-08-29 NOTE — ED Notes (Signed)
Patient presents calm cooperative with stable mood affect congruent; denies any current thoughts of self harm or thoughts to harm anyone else; denies any auditory or visual hallucinations.

## 2013-08-29 NOTE — ED Provider Notes (Signed)
Medical screening examination/treatment/procedure(s) were performed by non-physician practitioner and as supervising physician I was immediately available for consultation/collaboration.   Quinn Bartling M Ross Bender, MD 08/29/13 1539 

## 2013-08-29 NOTE — ED Notes (Signed)
Patient presented into doorway of room asking for help because he felt as if he was going to pass out. He was assisted on to the floor by myself and Alycia Rossetti and laid on his back; patient closed his eyes and stated "where am I what happened." Upon assessment patient was not diaphoretic, patient's vitals were stable but pulse was slightly tachycardic (T- 98.3, P- 105, B/P- 150/89, R- 20) Pulse Ox was 94% and CBG was 92. Consulted Charles PA who felt patient was stable and could get off floor and back into room. Ryan and I escorted patient back into room and into the bed. Patient has been up to the bathroom 4x within last hour without assistance.

## 2013-08-29 NOTE — Consult Note (Signed)
Agree with plan 

## 2013-08-29 NOTE — BH Assessment (Signed)
Per Thurman Coyer at [facility], pt has been accepted to [id number]. Oncoming TTS to do support paperwork.

## 2013-08-29 NOTE — Telephone Encounter (Signed)
Called pt to remind him to bring monitor w/ him to tomorrow's appt w/ Dr. Lucianne Muss. Per pt's wife, pt is in hospital at this time. I have cancelled tomorrow's appt w/ Dr. Lucianne Muss / Roanna Raider

## 2013-08-30 ENCOUNTER — Ambulatory Visit: Payer: Self-pay | Admitting: Endocrinology

## 2013-08-30 ENCOUNTER — Encounter (HOSPITAL_COMMUNITY): Payer: Self-pay | Admitting: Emergency Medicine

## 2013-08-30 DIAGNOSIS — F333 Major depressive disorder, recurrent, severe with psychotic symptoms: Principal | ICD-10-CM

## 2013-08-30 DIAGNOSIS — F411 Generalized anxiety disorder: Secondary | ICD-10-CM | POA: Diagnosis present

## 2013-08-30 LAB — POCT I-STAT, CHEM 8
BUN: <3 mg/dL — ABNORMAL LOW (ref 6–23)
Calcium, Ion: 1.22 mmol/L (ref 1.12–1.23)
Chloride: 102 mEq/L (ref 96–112)
Glucose, Bld: 203 mg/dL — ABNORMAL HIGH (ref 70–99)
TCO2: 20 mmol/L (ref 0–100)

## 2013-08-30 LAB — GLUCOSE, CAPILLARY: Glucose-Capillary: 155 mg/dL — ABNORMAL HIGH (ref 70–99)

## 2013-08-30 MED ORDER — NABUMETONE 750 MG PO TABS
750.0000 mg | ORAL_TABLET | Freq: Two times a day (BID) | ORAL | Status: DC
  Administered 2013-08-30 – 2013-09-07 (×16): 750 mg via ORAL
  Filled 2013-08-30 (×22): qty 1

## 2013-08-30 MED ORDER — OMEGA-3-ACID ETHYL ESTERS 1 G PO CAPS
2.0000 g | ORAL_CAPSULE | Freq: Two times a day (BID) | ORAL | Status: DC
  Administered 2013-08-30 – 2013-09-07 (×17): 2 g via ORAL
  Filled 2013-08-30 (×22): qty 2

## 2013-08-30 MED ORDER — NIACIN ER 500 MG PO CPCR
1000.0000 mg | ORAL_CAPSULE | Freq: Every day | ORAL | Status: DC
  Administered 2013-08-31 – 2013-09-07 (×8): 1000 mg via ORAL
  Filled 2013-08-30 (×10): qty 2

## 2013-08-30 NOTE — [facility] Group Notes (Signed)
[facility] LCSW Group Therapy  08/30/2013 , 1:00 PM   Type of Therapy:  Group Therapy  Participation Level:  Minimal  Participation Quality:  Attentive  Affect:  Appropriate  Cognitive:  Alert  Insight:  Improving  Engagement in Therapy:  Engaged  Modes of Intervention:  Discussion, Exploration and Socialization  Summary of Progress/Problems: Today's group focused on the term Diagnosis.  Participants were asked to define the term, and then pronounce whether it is a negative, positive or neutral term.  Was minimally active for the 30 minutes he was in group.  Became activated when the concept of stigma came up, and spoke of how his family does not understand and just tells him to get over it.  Was then called out to see the Dr and did not return.  Daryel Gerald B 08/30/2013 , 1:00 PM
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
## 2013-08-30 NOTE — Progress Notes (Signed)
Adult Psychoeducational Group Note  Date:  08/30/2013 Time:  8:00 pm  Group Topic/Focus:  Wrap-Up Group:   The focus of this group is to help patients review their daily goal of treatment and discuss progress on daily workbooks.  Participation Level:  Active  Participation Quality:  Appropriate and Sharing  Affect:  Appropriate  Cognitive:  Appropriate  Insight: Good  Engagement in Group:  Engaged  Modes of Intervention:  Discussion, Education, Socialization and Support  Additional Comments:  Pt stated that he is the hospital because of anxiety and his Bipolar Disorder. Pt stated that he is thankful to be in the hospital receiving treatment. Pt stated that he is happy to take stress of his family.   Laural Benes, Nashya Garlington 08/30/2013, 10:38 PM

## 2013-08-30 NOTE — ED Notes (Signed)
Will notidy EDP that pt has been without nucynta since arrival to ED on Sunday (?diarrhea from this)

## 2013-08-30 NOTE — ED Notes (Signed)
Transport service here, call to RN at [facility]

## 2013-08-30 NOTE — ED Notes (Signed)
Bed: WU98 Expected date: 08/30/13 Expected time: 6:46 AM Means of arrival: Ambulance Comments: N,v,d from [facility]

## 2013-08-30 NOTE — ED Notes (Signed)
Await arrival of transport service

## 2013-08-30 NOTE — [facility] Suicide Risk Assessment (Signed)
Suicide Risk Assessment  Admission Assessment     Nursing information obtained from:    Demographic factors:    Current Mental Status:    Loss Factors:    Historical Factors:    Risk Reduction Factors:     CLINICAL FACTORS:   Severe Anxiety and/or Agitation Depression:   Anhedonia Delusional Hopelessness Impulsivity Insomnia Currently Psychotic  COGNITIVE FEATURES THAT CONTRIBUTE TO RISK:  Closed-mindedness Polarized thinking    SUICIDE RISK:   Mild:  Suicidal ideation of limited frequency, intensity, duration, and specificity.  There are no identifiable plans, no associated intent, mild dysphoria and related symptoms, good self-control (both objective and subjective assessment), few other risk factors, and identifiable protective factors, including available and accessible social support.  PLAN OF CARE:1. Admit for crisis management and stabilization. 2. Medication management to reduce current symptoms to base line and improve the     patient's overall level of functioning 3. Treat health problems as indicated. 4. Develop treatment plan to decrease risk of relapse upon discharge and the need for     readmission. 5. Psycho-social education regarding relapse prevention and self care. 6. Health care follow up as needed for medical problems. 7. Restart home medications where appropriate.   I certify that inpatient services furnished can reasonably be expected to improve the patient's condition.  Thedore Mins, MD 08/30/2013, 11:29 AM

## 2013-08-30 NOTE — Progress Notes (Signed)
Pt stating that his son brought in home med (Tapentadol) this afternoon before 1500. Writer along [employer], Charity fundraiser, charge nurse at this time, went around to all the RNs asking if they had taken a med that was brought in by a pt's family member. None of the RNs at this time is aware of a med that was brought in. Writer discuss this with pt and informed him to gather more information from his son.

## 2013-08-30 NOTE — ED Notes (Signed)
Spoke with RN at [facility], advised her that pt was returning with transport service now.  Advised her that pt did not have his 8am meds.  Pt left with sitter, ambulatory

## 2013-08-30 NOTE — Telephone Encounter (Signed)
noted 

## 2013-08-30 NOTE — ED Provider Notes (Signed)
CSN: [id number]     Arrival date & time 08/30/13  7829 History   First MD Initiated Contact with Patient 08/30/13 [phone]     Chief Complaint  Patient presents with  . Weakness  . Nausea   (Consider location/radiation/quality/duration/timing/severity/associated sxs/prior Treatment) HPI Comments: [name] is a 37 y.o. Male who was an [facility] the [facility] this morning, when he complained of lethargy. He was assessed by the PA. His vital signs were normal. The PA sent into the ER for evaluation. The patient states that he has had vomiting and diarrhea for 3 days. He, states that he wants something for anxiety. He was seen here and stabilized and transferred to the behavioral health Hospital. Last evening. There is no nursing or medical provider documentation for this patient regarding a gastroenterologic issue with this patient since arrival at the behavioral health Hospital. He has been ambulatory there. He has participated in group sessions there. The patient does not have any further complaints. There are no other known modifying factors.   Patient is a 38 y.o. male presenting with weakness. The history is provided by the patient.  Weakness    Past Medical History  Diagnosis Date  . Allergy   . Asthma   . Depression   . GERD (gastroesophageal reflux disease)   . Hypertension   . Hyperlipidemia   . Chronic pain disorder     Sees [facility]  . Urinary incontinence     detrusor instability  . Hypogonadism   . Diabetes mellitus     Type II   Past Surgical History  Procedure Laterality Date  . Appendectomy    . Hernia repair    . Nasal sinus surgery  2008   Family History  Problem Relation Age of Onset  . Diabetes Mother   . Hypertension Mother   . Cirrhosis Mother   . Depression Mother   . Bipolar disorder Mother   . Arthritis Father 49    osteoarthritis  . Bipolar disorder Father   . Diabetes Sister     borderline  . Heart  disease Paternal Uncle 32    MI  . Heart disease Maternal Grandfather     late 60's--MI  . Cancer Paternal Grandmother 35    lung  . Bipolar disorder Paternal Grandmother   . Heart disease Paternal Grandfather 90    MI  . Bipolar disorder Paternal Grandfather    History  Substance Use Topics  . Smoking status: Current Every Day Smoker -- 1.50 packs/day    Types: Cigarettes  . Smokeless tobacco: Never Used  . Alcohol Use: No    Review of Systems  Neurological: Positive for weakness.  All other systems reviewed and are negative.    Allergies  Ciprofloxacin  Home Medications   Current Outpatient Rx  Name  Route  Sig  Dispense  Refill  . albuterol (PROVENTIL HFA;VENTOLIN HFA) 108 (90 BASE) MCG/ACT inhaler   Inhalation   Inhale 2 puffs into the lungs every 6 (six) hours as needed for wheezing.         . cetirizine (ZYRTEC) 10 MG tablet   Oral   Take 1 tablet (10 mg total) by mouth 2 (two) times daily. For seasonal allergies.   60 tablet   5   . divalproex (DEPAKOTE ER) 250 MG 24 hr tablet   Oral   Take 500 mg by mouth at bedtime.         . DULoxetine (CYMBALTA)  60 MG capsule   Oral   Take 1 capsule (60 mg total) by mouth daily. For anxiety and depression.   30 capsule   0   . insulin aspart (NOVOLOG) 100 UNIT/ML injection   Subcutaneous   Inject 5 Units into the skin 3 (three) times daily with meals. Per sliding scale < 150 0 units 150-200 2 units 201-250 4 units 251-300 6 units 301-350 8 units 351-400 10 units >400 12 units         . Insulin Glargine 100 UNIT/ML SOPN   Subcutaneous   Inject 70 Units into the skin at bedtime. For glycemic control of type 2 DM.         Marland Kitchen insulin NPH (HUMULIN N,NOVOLIN N) 100 UNIT/ML injection   Subcutaneous   Inject 20 Units into the skin at bedtime.   1 vial   5   . metFORMIN (GLUCOPHAGE) 1000 MG tablet   Oral   Take 1 tablet (1,000 mg total) by mouth 2 (two) times daily with a meal. For type two DM.          . methocarbamol (ROBAXIN) 750 MG tablet   Oral   Take 1 tablet (750 mg total) by mouth 3 (three) times daily. For muscle spasms.         . nabumetone (RELAFEN) 750 MG tablet   Oral   Take 1 tablet (750 mg total) by mouth 2 (two) times daily.         . niacin (NIASPAN) 1000 MG CR tablet   Oral   Take 1 tablet (1,000 mg total) by mouth daily. For lipid control.   30 tablet   2   . omega-3 acid ethyl esters (LOVAZA) 1 G capsule   Oral   Take 4 capsules (4 g total) by mouth at bedtime. For hyperlipidemia         . ondansetron (ZOFRAN) 4 MG tablet      TAKE 1 TABLET (4 MG TOTAL) BY MOUTH EVERY 8 (EIGHT) HOURS AS NEEDED FOR NAUSEA.   20 tablet   0   . risperiDONE (RISPERDAL M-TABS) 1 MG disintegrating tablet   Oral   Take 1 tablet (1 mg total) by mouth 2 (two) times daily. For mood stabilization and psychosis.   60 tablet   0   . simvastatin (ZOCOR) 20 MG tablet   Oral   Take 1 tablet (20 mg total) by mouth daily. For hyperlipidemia.   30 tablet   2   . tadalafil (CIALIS) 5 MG tablet   Oral   Take 1 tablet (5 mg total) by mouth daily. For erectile dysfunction.   10 tablet   0   . Tapentadol HCl 250 MG TB12   Oral   Take 250 mg by mouth 2 (two) times daily. For pain.      0   . traZODone (DESYREL) 100 MG tablet   Oral   Take 1 tablet (100 mg total) by mouth at bedtime. For insomnia.   30 tablet   0    BP 127/79  Pulse 97  Temp(Src) 98.2 F (36.8 C) (Oral)  Resp 22  Ht 6' (1.829 m)  Wt 221 lb (100.245 kg)  BMI 29.97 kg/m2  SpO2 96% Physical Exam  Nursing note and vitals reviewed. Constitutional: He is oriented to person, place, and time. He appears well-developed and well-nourished.  HENT:  Head: Normocephalic and atraumatic.  Right Ear: External ear normal.  Left Ear: External ear normal.  Eyes:  Conjunctivae and EOM are normal. Pupils are equal, round, and reactive to light.  Neck: Normal range of motion and phonation normal. Neck supple.   Cardiovascular: Normal rate and regular rhythm.   Pulmonary/Chest: Effort normal. He exhibits no bony tenderness.  Abdominal: Normal appearance.  Musculoskeletal: Normal range of motion.  Neurological: He is alert and oriented to person, place, and time. No cranial nerve deficit or sensory deficit. He exhibits normal muscle tone. Coordination normal.  Skin: Skin is warm, dry and intact.  Psychiatric: He has a normal mood and affect. His behavior is normal. Judgment and thought content normal.    ED Course  Procedures (including critical care time)  0730- consult requested by psychiatry, to discuss case. Dr, Jannifer Franklin called back at 0830. He accepted the patient back to the [facility].      Labs Review Labs Reviewed  GLUCOSE, CAPILLARY - Abnormal; Notable for the following:    Glucose-Capillary 137 (*)    All other components within normal limits  GLUCOSE, CAPILLARY - Abnormal; Notable for the following:    Glucose-Capillary 155 (*)    All other components within normal limits  POCT I-STAT, CHEM 8 - Abnormal; Notable for the following:    BUN <3 (*)    Glucose, Bld 203 (*)    All other components within normal limits  GLUCOSE, CAPILLARY   Imaging Review No results found.  EKG Interpretation   None       MDM   1. Lethargy    Reported lethargy with normal physical exam and vital signs, and reassuring laboratory evaluation. There is no evidence for hyponatremic. There is no evidence for ongoing loss of a bodily fluids. The patient is medically cleared for treatment in a psychiatric hospital setting. There is no indication to change his medication regimen. He is stable for discharge from the emergency department.  Nursing Notes Reviewed/ Care Coordinated, and agree without changes. Applicable Imaging Reviewed.  Interpretation of Laboratory Data incorporated into ED treatment  Plan: Return to the [facility] to continue his psychiatric  treatment    Flint Melter, MD 08/30/13 [phone]

## 2013-08-30 NOTE — Progress Notes (Addendum)
D: Patient in the dayroom on first approach.  Patient states, "I am feeling much better."  Patient states he has been making sure to increase his fluid intake and states he was able to go to lunch and dinner today.  Patient states he continues i be in pain and states he wants to get back on his normal pain medications.  Patient appears flat but he does brighten when Clinical research associate speaks with him.  Patient states he has attended groups today and states he just wants to get better.  Patient denies SI/HI and denies AVH. A: Staff to monitor Q 15 mins for safety.  Encouragement and support offered.  Scheduled medications administered per orders. R: Patient remains safe on the unit.  Patient attended group tonight.  Patient visible on the unit.  Patient taking administered medications.

## 2013-08-30 NOTE — Progress Notes (Signed)
S: Pt is a 38 y/o WM with CC of lethargy, weakness and fatigue. The patient denies any CP and or focal neural changes, but has a recent hx of hyponatremia  On 10/19 secondary to prior N/V/D sx. Patient was seen earlier in the week and given IVF.  O:V/S T 98.6, BP 139/96, P 114,  BG 155 mg/dl  Integument : W/D non diaphoretic  Pulm: CTA  Cardio: audible S1,S2  Neuro: A & O x 3, CN 2 -12 intact without focal neuro deficits  A/P:  Lethargy / generalized malaise likely secondary to hyponatremia Stat transport to [facility] ED for eval per EDP   Agree with assessment and plan Madie Reno A. Dub Mikes, M.D.

## 2013-08-30 NOTE — Progress Notes (Signed)
EMS called

## 2013-08-30 NOTE — Progress Notes (Addendum)
Patient got out of bed and walked up the hallway and informed writer that he was feeling anxious.  Patient states he feels weak and feels bad.  Patient appeared to appeared to loose consciousness but would arouse to name call. RN and MHT assisted patient to the floor safely. Patient appears pale and clammy.  Patient states he feels hot.  Patient VS taken at this time temp. 98.6, B/P 139/95, heartrate 114 and respirations 24 and cbg 155.  Patient oriented time 3.  Patient was able to stand up to get in a wheelchair and was take to his room to get back in bed to wait for EMS arrival.  Patient was unable to get morning labs drawn this am due to being weak.  Patient states it is ok to call his wife to inform her of what is going on.  Patient tells Clinical research associate that he feel extremely weak and states he has been unable to eat and drink.  EMS arrived around [phone] this am to transport patient to [place].

## 2013-08-30 NOTE — Progress Notes (Addendum)
Pt arrived to unit safely. Pt scheduled 8 am meds not given in ED. Writer administered scheduled 8 am meds. Pt c/o of being hungry and stated that the staff did not feed him anything while in the ED. Pt states that he is in pain and that the ED doctor said he could not have any meds while in ED.     D: Pt denies SI/HI/AVH. Pt presents with anxious mood. Frequently asking for pain meds. Pt stated that he is withdrawing from opiates. Pt presents with visible tremors. NP made aware. Pt compliant with attending groups and taking meds.  A: Medications administered as ordered per MD. Prn librium available for withdrawal symptoms. Verbal support given. Pt encouraged to attend groups. 15 minute check performed for safety.  R: Safety maintained.

## 2013-08-30 NOTE — Progress Notes (Signed)
Patient notified RN that his son had brought in his Tapentadol medication.  Writer could not find the medication at this nurses station bu figured out it had been locked up in patient locker.  Writer and Minerva Areola [facility] retrieved the medication and counted it and there were 34 tabs of Tapentadol 250mg .  Medication was then placed in the 400 hall med room in the lock box at the top of the medication cart.  There is a flowsheet along with the medication to document medication retrieval.  Donell Sievert notified that patient has not had his medication today due to it not being available.  Karleen Hampshire states patient can begin this medication in tomorrow morning at the scheduled time so her can stay on the correct schedule.  Patient notified that his medication has been found and he was given medication documentation with the number of tabs that were brought in.

## 2013-08-30 NOTE — ED Notes (Signed)
Insulin not given at this time as it is ordered "with meals" and pt is being evaluated, no meal tray available.  Glucose on lad 203 and CBG 155.  Will continue to monitor.

## 2013-08-30 NOTE — ED Notes (Signed)
Called [facility] for Dr Effie Shy Talked to [place]. Asked her to have the DR the sent patient over to call Dr Effie Shy

## 2013-08-30 NOTE — BH Assessment (Addendum)
0830 Writer spoke w/ Blane Ohara who will give message to Dr. Jannifer Franklin to call EDP Effie Shy to discuss pt.   Evette Cristal, [place] Assessment Counselor   TC from [place] at [facility] requesting that MD who sent pt over to [facility] from [facility] call EDP Wentz at x [phone]. Writer called EDP Effie Shy who stated that he wants pt accepted back to [facility] as Effie Shy states pt doesn't need to be at [facility] based on pt's normal vital signs. Writer told Effie Shy that no providers will be in office at [facility] until 8:30 am.  Writer called Donell Sievert PA-C who has badged out from his overnight shift. Melvenia Beam was provider who sent pt to [facility] and Melvenia Beam sts that Effie Shy will need to speak with one of the [facility] providers after they arrive at 8:30 am. Writer will contact [facility] providers once they arrive and have one of them call EDP Wentz to discuss pt's status at [facility].  Evette Cristal, [place] Assessment Counselor

## 2013-08-30 NOTE — ED Notes (Signed)
Pt reports feeling bad since Sunday when he came to the ED for treatment. Pt states he still does not feel well. Denies ab pain but reports nausea.

## 2013-08-31 LAB — GLUCOSE, CAPILLARY
Glucose-Capillary: 105 mg/dL — ABNORMAL HIGH (ref 70–99)
Glucose-Capillary: 252 mg/dL — ABNORMAL HIGH (ref 70–99)
Glucose-Capillary: 127 mg/dL — ABNORMAL HIGH (ref 70–99)

## 2013-08-31 MED ORDER — BUSPIRONE HCL 15 MG PO TABS
15.0000 mg | ORAL_TABLET | Freq: Three times a day (TID) | ORAL | Status: DC
  Administered 2013-08-31 – 2013-09-07 (×22): 15 mg via ORAL
  Filled 2013-08-31: qty 1
  Filled 2013-08-31: qty 9
  Filled 2013-08-31 (×10): qty 1
  Filled 2013-08-31: qty 9
  Filled 2013-08-31 (×2): qty 1
  Filled 2013-08-31: qty 9
  Filled 2013-08-31 (×4): qty 1
  Filled 2013-08-31: qty 9
  Filled 2013-08-31 (×9): qty 1

## 2013-08-31 MED ORDER — TRAZODONE HCL 50 MG PO TABS
150.0000 mg | ORAL_TABLET | Freq: Every day | ORAL | Status: DC
  Administered 2013-08-31 – 2013-09-06 (×7): 150 mg via ORAL
  Filled 2013-08-31 (×2): qty 1
  Filled 2013-08-31: qty 9
  Filled 2013-08-31 (×6): qty 1

## 2013-08-31 MED ORDER — RISPERIDONE 2 MG PO TBDP
2.0000 mg | ORAL_TABLET | Freq: Every day | ORAL | Status: DC
  Filled 2013-08-31: qty 1

## 2013-08-31 MED ORDER — RISPERIDONE 2 MG PO TBDP
2.0000 mg | ORAL_TABLET | Freq: Every day | ORAL | Status: DC
  Administered 2013-08-31 – 2013-09-06 (×7): 2 mg via ORAL
  Filled 2013-08-31 (×4): qty 1
  Filled 2013-08-31: qty 3
  Filled 2013-08-31 (×4): qty 1

## 2013-08-31 NOTE — Progress Notes (Signed)
[facility] MD Progress Note  08/31/2013 11:14 AM EH SESAY  MRN:  [id number] Subjective: "I am very anxious and having difficulty sleeping due to my joint pain." Objective: Patient reports ongoing anxiety, excessive worries, difficulty sleeping, low energy level, lack of motivation, paranoia, feeling hopeless and depressed. He is fixated on getting back on Klonopin or any other benzodiazepine but he had a history of suicide attempt with Klonopin before he was weaned off by his psychiatrist. He denies auditory and visual hallucinations but he is still having suicidal thoughts with no plan to act on it. 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  HENT: Negative.   Eyes: Negative.   Respiratory: Negative.   Cardiovascular: Negative.   Gastrointestinal: Negative.   Genitourinary: Negative.   Musculoskeletal: Positive for joint pain.  Skin: Negative.   Neurological: Positive for tremors.  Endo/Heme/Allergies: Negative.   Psychiatric/Behavioral: Positive for depression, suicidal ideas and hallucinations. The patient is nervous/anxious and has insomnia.     Blood pressure 146/97, pulse 94, temperature 98 F (36.7 C), temperature source Oral, resp. rate 16, height 6' (1.829 m), weight 100.245 kg (221 lb), SpO2 96.00%.Body mass index is 29.97 kg/(m^2).  General Appearance: Fairly Groomed  Proofreader::  Minimal  Speech:  Slow and Slurred  Volume:  Decreased  Mood:  Anxious, Depressed and Hopeless  Affect:  Blunt  Thought Process:  Circumstantial  Orientation:  Full (Time, Place, and Person)  Thought Content:  Paranoid Ideation  Suicidal Thoughts:  Yes.  without intent/plan  Homicidal Thoughts:  No  Memory:  Immediate;   Fair Recent;   Fair Remote;   Fair  Judgement:  Poor  Insight:  Lacking  Psychomotor Activity:  Decreased  Concentration:  Fair  Recall:  Fair  Akathisia:  No  Handed:  Right  AIMS (if indicated):     Assets:  Financial Resources/Insurance Social Support  Sleep:  Number of Hours: 4.5   Current Medications: Current Facility-Administered Medications  Medication Dose Route Frequency Provider Last Rate Last Dose  . acetaminophen (TYLENOL) tablet 650 mg  650 mg Oral Q6H PRN Earney Navy, NP      . albuterol (PROVENTIL HFA;VENTOLIN HFA) 108 (90 BASE) MCG/ACT inhaler 2 puff  2 puff Inhalation Q6H PRN Earney Navy, NP      . alum & mag hydroxide-simeth (MAALOX/MYLANTA) 200-200-20 MG/5ML suspension 30 mL  30 mL Oral Q4H PRN Earney Navy, NP      . busPIRone (BUSPAR) tablet 15 mg  15 mg Oral TID Anuel Sitter      . DULoxetine (CYMBALTA) DR capsule 60 mg  60 mg Oral Daily Earney Navy, NP   60 mg at 08/31/13 0814  . hydrOXYzine (ATARAX/VISTARIL) tablet 50 mg  50 mg Oral Q6H PRN Earney Navy, NP   50 mg at 08/31/13 0626  . insulin aspart (novoLOG) injection 5 Units  5 Units Subcutaneous TID WC Earney Navy, NP  5 Units at 08/31/13 0659  . insulin glargine (LANTUS) injection 70 Units  70 Units Subcutaneous QHS Earney Navy, NP   70 Units at 08/30/13 2119  . insulin NPH (HUMULIN N,NOVOLIN N) injection 20 Units  20 Units Subcutaneous QHS Earney Navy, NP   20 Units at 08/30/13 2119  . loperamide (IMODIUM) capsule 4 mg  4 mg Oral PRN Earney Navy, NP      . loratadine (CLARITIN) tablet 10 mg  10 mg Oral Daily  Earney Navy, NP   10 mg at 08/31/13 0814  . magnesium hydroxide (MILK OF MAGNESIA) suspension 30 mL  30 mL Oral Daily PRN Earney Navy, NP      . metFORMIN (GLUCOPHAGE) tablet 1,000 mg  1,000 mg Oral BID WC Earney Navy, NP   1,000 mg at 08/31/13 0814  . methocarbamol (ROBAXIN) tablet 750 mg  750 mg Oral TID Earney Navy, NP   750 mg at 08/31/13 0814  . nabumetone (RELAFEN) tablet 750 mg  750 mg Oral BID WC Catarina Hartshorn, MD   750 mg at 08/31/13 1045  . niacin CR capsule 1,000 mg  1,000 mg Oral Daily Sadrac Zeoli   1,000 mg at 08/31/13 0815  . nicotine (NICODERM CQ - dosed in mg/24 hours) patch 21 mg  21 mg Transdermal Daily Earney Navy, NP   21 mg at 08/31/13 0814  . omega-3 acid ethyl esters (LOVAZA) capsule 2 g  2 g Oral BID Flint Melter, MD   2 g at 08/31/13 0815  . ondansetron (ZOFRAN) tablet 4 mg  4 mg Oral Q8H PRN Earney Navy, NP      . Melene Muller ON 09/01/2013] risperiDONE (RISPERDAL M-TABS) disintegrating tablet 2 mg  2 mg Oral QHS Isaura Schiller      . simvastatin (ZOCOR) tablet 20 mg  20 mg Oral q1800 Earney Navy, NP   20 mg at 08/30/13 1710  . Tapentadol HCl TB12 250 mg  250 mg Oral BID Anntionette Madkins   250 mg at 08/31/13 0819  . traZODone (DESYREL) tablet 150 mg  150 mg Oral QHS Kirstin Kugler        Lab Results:  Results for orders placed during the hospital encounter of 08/29/13 (from the past 48 hour(s))  GLUCOSE, CAPILLARY     Status: Abnormal   Collection Time    08/29/13  6:28 PM      Result Value Range   Glucose-Capillary 137 (*) 70 - 99 mg/dL   Comment 1 Documented in Chart     Comment 2 Notify RN    GLUCOSE, CAPILLARY     Status: None   Collection Time    08/29/13  9:11 PM      Result Value Range   Glucose-Capillary 95  70 - 99 mg/dL  GLUCOSE, CAPILLARY     Status: Abnormal   Collection Time    08/30/13  6:17 AM      Result Value Range   Glucose-Capillary 155 (*) 70 - 99 mg/dL  POCT I-STAT, CHEM 8     Status:  Abnormal   Collection Time    08/30/13  8:08 AM      Result Value Range   Sodium 138  135 - 145 mEq/L   Potassium 3.9  3.5 - 5.1 mEq/L   Chloride 102  96 - 112 mEq/L   BUN <3 (*) 6 - 23 mg/dL   Creatinine, Ser 1.61  0.50 - 1.35  mg/dL   Glucose, Bld 130 (*) 70 - 99 mg/dL   Calcium, Ion 8.65  7.84 - 1.23 mmol/L   TCO2 20  0 - 100 mmol/L   Hemoglobin 13.9  13.0 - 17.0 g/dL   HCT 69.6  29.5 - 28.4 %  GLUCOSE, CAPILLARY     Status: Abnormal   Collection Time    08/30/13 11:55 AM      Result Value Range   Glucose-Capillary 262 (*) 70 - 99 mg/dL  GLUCOSE, CAPILLARY     Status: Abnormal   Collection Time    08/30/13  4:59 PM      Result Value Range   Glucose-Capillary 162 (*) 70 - 99 mg/dL  GLUCOSE, CAPILLARY     Status: Abnormal   Collection Time    08/30/13  9:05 PM      Result Value Range   Glucose-Capillary 227 (*) 70 - 99 mg/dL  GLUCOSE, CAPILLARY     Status: Abnormal   Collection Time    08/31/13  6:32 AM      Result Value Range   Glucose-Capillary 105 (*) 70 - 99 mg/dL   Comment 1 Notify RN      Physical Findings: AIMS: Facial and Oral Movements Muscles of Facial Expression: None, normal Lips and Perioral Area: None, normal Jaw: None, normal Tongue: None, normal,Extremity Movements Upper (arms, wrists, hands, fingers): None, normal Lower (legs, knees, ankles, toes): None, normal, Trunk Movements Neck, shoulders, hips: None, normal, Overall Severity Severity of abnormal movements (highest score from questions above): None, normal Incapacitation due to abnormal movements: None, normal Patient's awareness of abnormal movements (rate only patient's report): No Awareness, Dental Status Current problems with teeth and/or dentures?: No Does patient usually wear dentures?: No  CIWA:  CIWA-Ar Total: 0 COWS:  COWS Total Score: 2  Treatment Plan Summary: Daily contact with patient to assess and evaluate symptoms and progress in treatment Medication management  Plan:1.  Admit for crisis management and stabilization. 2. Medication management to reduce current symptoms to base line and improve the     patient's overall level of functioning 3. Treat health problems as indicated. 4. Develop treatment plan to decrease risk of relapse upon discharge and the need for     readmission. 5. Psycho-social education regarding relapse prevention and self care. 6. Health care follow up as needed for medical problems. 7. Restart home medications where appropriate. 8. Will increase Trazodone to 150mg  po Qhs for insomnia 9. Change Risperidone to 2mg  po Qhs for paranoia   Medical Decision Making Problem Points:  Established problem, worsening (2), Review of last therapy session (1) and Review of psycho-social stressors (1) Data Points:  Order Aims Assessment (2) Review of medication regiment & side effects (2) Review of new medications or change in dosage (2)  I certify that inpatient services furnished can reasonably be expected to improve the patient's condition.   Thedore Mins, MD 08/31/2013, 11:14 AM

## 2013-08-31 NOTE — Progress Notes (Signed)
Pt was lying in his bed and stated everything was fine. He opted not to go outside with the other pts. Pt denies SI or HI and does contract for safety. He stated he had a good day and had been attending groups./Pt does have good eye contact and this afternoon has chosen to isolate to his room. At this time he is denying any pain in his back.

## 2013-08-31 NOTE — Tx Team (Signed)
  Interdisciplinary Treatment Plan Update   Date Reviewed:  08/31/2013  Time Reviewed:  8:07 AM  Progress in Treatment:   Attending groups: Yes Participating in groups: Yes Taking medication as prescribed: Yes  Tolerating medication: Yes Family/Significant other contact made: No Patient understands diagnosis: Yes  As evidenced by asking for help with anxiety, depression Discussing patient identified problems/goals with staff: Yes Medical problems stabilized or resolved: Yes Denies suicidal/homicidal ideation: Yes  In tx team Patient has not harmed self or others: Yes  For review of initial/current patient goals, please see plan of care.  Estimated Length of Stay:  4-5 days  Reason for Continuation of Hospitalization: Anxiety Depression Medication stabilization  New Problems/Goals identified:  N/A  Discharge Plan or Barriers:   return home, follow up outpt  Additional Comments: This is a 37 year old male who was admitted voluntarily for suicidal thoughts and paranoia. Patient was recently a patient at [facility] after overdosing on klonopin. The patient is upset about being taken off his Klonopin as he was on it for many years. Patient states "I was given librium for five days for detox. But I think I am having a delayed withdrawal. I think I need more benzos. My main stressor is financial. I'm on Disability and my wife is trying to get on it as well. My wife brought me to the ED because I was thinking of shooting myself. I have been feeling very paranoid lately feeling like my Doctor is conspiring with the government against me.    Attendees:  Signature: Thedore Mins, MD 08/31/2013 8:07 AM   Signature: Richelle Ito, LCSW 08/31/2013 8:07 AM  Signature: Fransisca Kaufmann, NP 08/31/2013 8:07 AM  Signature: Joslyn Devon, RN 08/31/2013 8:07 AM  Signature: Liborio Nixon, RN 08/31/2013 8:07 AM  Signature:  08/31/2013 8:07 AM  Signature:   08/31/2013 8:07 AM  Signature:    Signature:     Signature:    Signature:    Signature:    Signature:      Scribe for Treatment Team:   Richelle Ito, LCSW  08/31/2013 8:07 AM

## 2013-08-31 NOTE — Progress Notes (Signed)
Patient ID: LOYDE ORTH, male   DOB: 16-Aug-1976, 37 y.o.   MRN: [id number]  D: Patient has a flat affect on approach. Still reports depressed mood and anxiety. States trouble sleeping at night. Denies any SI at this time. Got pain medication from home. Denies any SI/HI or a/v hallucinations today. A: Staff will monitor on q 15 minutes, follow treatment plan, and give meds as ordered R: Cooperative and appropriate on the unit.

## 2013-08-31 NOTE — Progress Notes (Signed)
Adult Psychoeducational Group Note  Date:  08/31/2013 Time:  9:36 PM  Group Topic/Focus:  Wrap-Up Group:   The focus of this group is to help patients review their daily goal of treatment and discuss progress on daily workbooks.  Participation Level:  Minimal  Participation Quality:  Appropriate  Affect:  Flat  Cognitive:  Lacking  Insight: Lacking  Engagement in Group:  Engaged  Modes of Intervention:  Support  Additional Comments:  Patient attended and participated group tonight. He reports that today was "foggy" for him. He had some medication change, he slept and is feeling better. Today he attended his group, and meals. For his self development he wants to be more sociable with his family and continue seeing his therapist.  Scot Dock 08/31/2013, 9:36 PM

## 2013-08-31 NOTE — [facility] Group Notes (Signed)
[facility] LCSW Aftercare Discharge Planning Group Note   08/31/2013 8:09 AM  Participation Quality:  Minimal  Mood/Affect:  Depressed and Flat  Depression Rating:  8  Anxiety Rating:  8  Thoughts of Suicide:  No Will you contract for safety?   NA  Current AVH:  No  Plan for Discharge/Comments:  States he is here because of depression and anxiety as the result of "one med being added and another being discontinued."  Identified deapakote as the one added that has since been d/ced; would not identify the other.  Lives at home with wife of 19 years and 18yo and 12yo.  Disability for mental health.  Wfie is not working and has recent history of seizures.  Transportation Means: family  Supports: family  [name], [name]

## 2013-08-31 NOTE — Progress Notes (Signed)
Recreation Therapy Notes  Date: 10.22.2014 Time: 9:30am Location: [address] Dayroom  Group Topic: Self-Esteem  Goal Area(s) Addresses:  Patient will effectively give examples of way to increase self-esteem. Patient will verbalize benefit of increased self-esteem.   Behavioral Response: Appropriate   Intervention: Informational Worksheet  Activity: Patient were given the '10 Steps to Self-Esteem' worksheet and asked to identify why each step is important, as well as given examples of how they can invest in each step. 10 Steps include: 1- Know yourself, 2- Understand what makes you feel great. 3- Recognize things that get your down. 4- Set goals to achieve what you want. 5- Develop trusting friendships that make you feel good. 6- Don't be afraid to ask for help. 7- Stand up for your beliefs and values 8- Help someone else 9- Take responsibility for your own actions 10- Take good care of yourself.   Education:  Discharge Planning, Self investment  Education Outcome: Needs additional education  Clinical Observations/Feedback: Patient attended group, but made no contributions to group discussion.   Marykay Lex [name], LRT/CTRS  [name] 08/31/2013 12:41 PM

## 2013-08-31 NOTE — Progress Notes (Signed)
Adult Psychoeducational Group Note  Date:  08/31/2013 Time:  11:00AM Group Topic/Focus:  Personal Choices and Values:   The focus of this group is to help patients assess and explore the importance of values in their lives, how their values affect their decisions, how they express their values and what opposes their expression.  Participation Level:  Active  Participation Quality:  Appropriate and Attentive  Affect:  Appropriate  Cognitive:  Alert and Appropriate  Insight: Appropriate  Engagement in Group:  Engaged  Modes of Intervention:  Discussion  Additional Comments:  Pt. Was attentive and appropriate during today's group discussion. Pt was able to discuss crisis plan and values.   Bing Plume D 08/31/2013, 12:19 PM

## 2013-08-31 NOTE — [facility] Group Notes (Signed)
[facility] [employer] Group Therapy  08/31/2013 , 2:12 PM    Type of Therapy:  [employer] Presentation  Participation Level:  Active  Participation Quality:  Attentive  Affect:  Blunted  Cognitive:  Oriented  Insight:  Limited  Engagement in Therapy:  Engaged  Modes of Intervention:  Discussion, Education and Socialization  Summary of Progress/Problems:  Onalee Hua from [employer] came to present his recovery story and play the guitar.  Sat quietly throughout.  Clapped at appropriate times.  Daryel Gerald B 08/31/2013 , 2:12 PM

## 2013-08-31 NOTE — [facility] Suicide Risk Assessment (Signed)
[facility] INPATIENT:  Family/Significant Other Suicide Prevention Education  Suicide Prevention Education:  Education Completed; Hobson Lax, wife, [phone]  has been identified by the patient as the family member/significant other with whom the patient will be residing, and identified as the person(s) who will aid the patient in the event of a mental health crisis (suicidal ideations/suicide attempt).  With written consent from the patient, the family member/significant other has been provided the following suicide prevention education, prior to the and/or following the discharge of the patient.  The suicide prevention education provided includes the following:  Suicide risk factors  Suicide prevention and interventions  National Suicide Hotline telephone number  [facility] assessment telephone number  [facility] Emergency Assistance 911  [facility] and/or Residential Mobile Crisis Unit telephone number  Request made of family/significant other to:  Remove weapons (e.g., guns, rifles, knives), all items previously/currently identified as safety concern.    Remove drugs/medications (over-the-counter, prescriptions, illicit drugs), all items previously/currently identified as a safety concern.  The family member/significant other verbalizes understanding of the suicide prevention education information provided.  The family member/significant other agrees to remove the items of safety concern listed above.  Daryel Gerald B 08/31/2013, 5:17 PM

## 2013-08-31 NOTE — B[facility] Counselor (Signed)
Adult Psychosocial Assessment Update Interdisciplinary Team  Previous [facility] admissions/discharges:  Admissions Discharges  Date: 07/15/13 Date:  Date: Date:  Date: Date:  Date: Date:  Date: Date:   Changes since the last Psychosocial Assessment (including adherence to outpatient mental health and/or substance abuse treatment, situational issues contributing to decompensation and/or relapse). This is a 37 year old male who was admitted voluntarily for suicidal thoughts and paranoia. Patient was a patient at [facility] on 07/15/13 after overdosing on klonopin. The patient is upset about being taken off his Klonopin as he was on it for many years. Patient states "I was given librium for five days for detox. But I think I am having a delayed withdrawal. I think I need more benzos. My main stressor is financial. I'm on Disability and my wife is trying to get on it as well. My wife brought me to the ED because I was thinking of shooting myself. I have been feeling very paranoid lately feeling like my Doctor is conspiring with the government against me. I'm feeling better than yesterday but still rough."   He is also taking opiates on a daily basis for back pain, and has a history of cocaine dependence.              Discharge Plan 1. Will you be returning to the same living situation after discharge?   Yes:X  Home No:      If no, what is your plan?           2. Would you like a referral for services when you are discharged? Yes:     If yes, for what services?  No:       Already receiving mental health services       Summary and Recommendations (to be completed by the evaluator) Trey Paula is a 37 YO Caucasian male who presents as anxious and physically stiff.  He is asking for benzos by name for anxiety, and has been told it is not in his best interest, and will not be prescribed them here.  Instead, he is on a Risperdal, Buspar and Cymbalta trial for anxiety.  He can benefit from  crises stabilization, medication management, therapeutic milieu and referral for services.                       Signature:  Ida Rogue, 08/31/2013 3:15 PM

## 2013-09-01 LAB — GLUCOSE, CAPILLARY
Glucose-Capillary: 194 mg/dL — ABNORMAL HIGH (ref 70–99)
Glucose-Capillary: 190 mg/dL — ABNORMAL HIGH (ref 70–99)

## 2013-09-01 MED ORDER — TAPENTADOL HCL ER 250 MG PO TB12
250.0000 mg | ORAL_TABLET | Freq: Two times a day (BID) | ORAL | Status: DC
  Administered 2013-09-01 – 2013-09-07 (×12): 250 mg via ORAL

## 2013-09-01 MED ORDER — INSULIN ASPART 100 UNIT/ML ~~LOC~~ SOLN
0.0000 [IU] | Freq: Three times a day (TID) | SUBCUTANEOUS | Status: DC
  Administered 2013-09-01 (×2): 3 [IU] via SUBCUTANEOUS
  Administered 2013-09-02: 2 [IU] via SUBCUTANEOUS
  Administered 2013-09-02 (×2): 3 [IU] via SUBCUTANEOUS
  Administered 2013-09-03: 5 [IU] via SUBCUTANEOUS
  Administered 2013-09-03: 2 [IU] via SUBCUTANEOUS
  Administered 2013-09-03: 5 [IU] via SUBCUTANEOUS
  Administered 2013-09-04 (×2): 3 [IU] via SUBCUTANEOUS
  Administered 2013-09-05: 2 [IU] via SUBCUTANEOUS
  Administered 2013-09-05: 3 [IU] via SUBCUTANEOUS
  Administered 2013-09-05: 5 [IU] via SUBCUTANEOUS
  Administered 2013-09-06: 8 [IU] via SUBCUTANEOUS
  Administered 2013-09-06: 3 [IU] via SUBCUTANEOUS
  Administered 2013-09-06 – 2013-09-07 (×3): 5 [IU] via SUBCUTANEOUS

## 2013-09-01 NOTE — Progress Notes (Signed)
D:  Patient is up and visible in the milieu today.  Has been attending and participating in groups.  He appears anxious, wide eyed and has trouble sitting still for long periods of time.  He also spends a great deal of time pacing the hallways.  He requests hydroxyzine quite regularly, often before it is due, despite being prescribed high doses of Buspar.  He states he still has some thoughts of self harm, but states they are fleeting.  He agrees to seek out staff if feeling unsafe at any time. A:  Medications given as prescribed.  Encouraged patient to use alternate modalities to decrease his anxiety besides medications.   R:  Patient has been cooperative with staff and peers today.  Anxiety level remains high as evidenced by patients self report, his facial expressions, and his inability to sit still for long periods of time.  Safety is maintained.  He is tolerating his medications well.  Hydroxyzine is effective for anxiety, but patient needs to be encouraged to use non medication modalities to decrease anxiety as well.

## 2013-09-01 NOTE — Progress Notes (Signed)
Patient ID: [name], male   DOB: July 29, 1976, 37 y.o.   MRN: [id number] [facility] MD Progress Note  09/01/2013 2:53 PM [name]  MRN:  [id number] Subjective: Patient states "I am having less paranoia. It is not as constant as before. But my anxiety is still high at a seven. I'm not sure why. And I'm still having some suicidal thoughts flash across my mind. But I have been feeling a little better each day.   Objective:  Patient appears with better grooming today but still appears anxious. He is heard requesting a vistaril from his nurse before speaking to Clinical research associate. The patient is documented with improved sleep with recent increase in Trazodone and decreased paranoia since adjustment to Risperdal dosage. Patient is observed attending groups and is visible on the unit. He continues to endorse suicidal thoughts but denies having serious intent. However, patient recently had a serious suicide attempt in August of this year where he had overdosed on prescription medications. He remains at risk for self harm due to multiple stressors especially financial ones.   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   HENT: Negative.   Eyes: Negative.   Respiratory: Negative.   Cardiovascular: Negative.   Gastrointestinal: Negative.   Genitourinary: Negative.   Musculoskeletal: Positive for joint pain.  Skin: Negative.   Neurological:  Positive for tremors.  Endo/Heme/Allergies: Negative.   Psychiatric/Behavioral: Positive for depression, suicidal ideas and hallucinations. The patient is nervous/anxious and has insomnia.     Blood pressure 139/73, pulse 111, temperature 97.4 F (36.3 C), temperature source Oral, resp. rate 20, height 6' (1.829 m), weight 100.245 kg (221 lb), SpO2 96.00%.Body mass index is 29.97 kg/(m^2).  General Appearance: Fairly Groomed  Patent attorney::  Minimal  Speech:  Slow and Slurred  Volume:  Decreased  Mood:  Anxious, Depressed and Hopeless  Affect:  Blunt  Thought Process:  Circumstantial  Orientation:  Full (Time, Place, and Person)  Thought Content:  Paranoid Ideation  Suicidal Thoughts:  Yes.  without intent/plan  Homicidal Thoughts:  No  Memory:  Immediate;   Fair Recent;   Fair Remote;   Fair  Judgement:  Poor  Insight:  Lacking  Psychomotor Activity:  Decreased  Concentration:  Fair  Recall:  Fair  Akathisia:  No  Handed:  Right  AIMS (if indicated):     Assets:  Financial Resources/Insurance Social Support  Sleep:  Number of Hours: 5.75   Current Medications: Current Facility-Administered Medications  Medication Dose Route Frequency Provider Last Rate Last Dose  . acetaminophen (TYLENOL) tablet 650 mg  650 mg Oral Q6H PRN Earney Navy, NP      . albuterol (PROVENTIL HFA;VENTOLIN HFA) 108 (90 BASE) MCG/ACT inhaler 2 puff  2 puff Inhalation Q6H PRN Earney Navy, NP      . alum & mag hydroxide-simeth (MAALOX/MYLANTA) 200-200-20 MG/5ML suspension 30 mL  30 mL Oral Q4H PRN Earney Navy, NP      . busPIRone (  BUSPAR) tablet 15 mg  15 mg Oral TID Mojeed Akintayo   15 mg at 09/01/13 1204  . DULoxetine (CYMBALTA) DR capsule 60 mg  60 mg Oral Daily Earney Navy, NP   60 mg at 09/01/13 0834  . hydrOXYzine (ATARAX/VISTARIL) tablet 50 mg  50 mg Oral Q6H PRN Earney Navy, NP   50 mg at 09/01/13 1057  . insulin aspart (novoLOG) injection 0-15 Units  0-15 Units  Subcutaneous TID WC Fransisca Kaufmann, NP   3 Units at 09/01/13 1205  . insulin aspart (novoLOG) injection 5 Units  5 Units Subcutaneous TID WC Earney Navy, NP   5 Units at 09/01/13 1207  . insulin glargine (LANTUS) injection 70 Units  70 Units Subcutaneous QHS Earney Navy, NP   70 Units at 08/31/13 2201  . insulin NPH (HUMULIN N,NOVOLIN N) injection 20 Units  20 Units Subcutaneous QHS Earney Navy, NP   20 Units at 08/30/13 2119  . loperamide (IMODIUM) capsule 4 mg  4 mg Oral PRN Earney Navy, NP      . loratadine (CLARITIN) tablet 10 mg  10 mg Oral Daily Earney Navy, NP   10 mg at 09/01/13 0834  . magnesium hydroxide (MILK OF MAGNESIA) suspension 30 mL  30 mL Oral Daily PRN Earney Navy, NP      . metFORMIN (GLUCOPHAGE) tablet 1,000 mg  1,000 mg Oral BID WC Earney Navy, NP   1,000 mg at 09/01/13 0834  . methocarbamol (ROBAXIN) tablet 750 mg  750 mg Oral TID Earney Navy, NP   750 mg at 09/01/13 1204  . nabumetone (RELAFEN) tablet 750 mg  750 mg Oral BID WC Catarina Hartshorn, MD   750 mg at 09/01/13 0834  . niacin CR capsule 1,000 mg  1,000 mg Oral Daily Mojeed Akintayo   1,000 mg at 09/01/13 0833  . nicotine (NICODERM CQ - dosed in mg/24 hours) patch 21 mg  21 mg Transdermal Daily Earney Navy, NP   21 mg at 09/01/13 0800  . omega-3 acid ethyl esters (LOVAZA) capsule 2 g  2 g Oral BID Flint Melter, MD   2 g at 09/01/13 0834  . ondansetron (ZOFRAN) tablet 4 mg  4 mg Oral Q8H PRN Earney Navy, NP      . risperiDONE (RISPERDAL M-TABS) disintegrating tablet 2 mg  2 mg Oral QHS Kerry Hough, PA-C   2 mg at 08/31/13 2201  . simvastatin (ZOCOR) tablet 20 mg  20 mg Oral q1800 Earney Navy, NP   20 mg at 08/31/13 1657  . Tapentadol HCl TB12 250 mg  250 mg Oral Q12H Mojeed Akintayo      . traZODone (DESYREL) tablet 150 mg  150 mg Oral QHS Mojeed Akintayo   150 mg at 08/31/13 2201    Lab Results:  Results for orders placed during the hospital  encounter of 08/29/13 (from the past 48 hour(s))  GLUCOSE, CAPILLARY     Status: Abnormal   Collection Time    08/30/13  4:59 PM      Result Value Range   Glucose-Capillary 162 (*) 70 - 99 mg/dL  GLUCOSE, CAPILLARY     Status: Abnormal   Collection Time    08/30/13  9:05 PM      Result Value Range   Glucose-Capillary 227 (*) 70 - 99 mg/dL  GLUCOSE, CAPILLARY     Status: Abnormal   Collection Time    08/31/13  6:32 AM      Result Value Range   Glucose-Capillary 105 (*) 70 - 99 mg/dL   Comment 1 Notify RN    GLUCOSE, CAPILLARY     Status: Abnormal   Collection Time    08/31/13 11:33 AM      Result Value Range   Glucose-Capillary 170 (*) 70 - 99 mg/dL  GLUCOSE, CAPILLARY     Status: Abnormal   Collection Time    08/31/13  4:49 PM      Result Value Range   Glucose-Capillary 252 (*) 70 - 99 mg/dL  GLUCOSE, CAPILLARY     Status: Abnormal   Collection Time    08/31/13  8:58 PM      Result Value Range   Glucose-Capillary 127 (*) 70 - 99 mg/dL  GLUCOSE, CAPILLARY     Status: Abnormal   Collection Time    09/01/13  4:52 AM      Result Value Range   Glucose-Capillary 141 (*) 70 - 99 mg/dL  GLUCOSE, CAPILLARY     Status: Abnormal   Collection Time    09/01/13  6:31 AM      Result Value Range   Glucose-Capillary 224 (*) 70 - 99 mg/dL  GLUCOSE, CAPILLARY     Status: Abnormal   Collection Time    09/01/13 11:51 AM      Result Value Range   Glucose-Capillary 186 (*) 70 - 99 mg/dL    Physical Findings: AIMS: Facial and Oral Movements Muscles of Facial Expression: None, normal Lips and Perioral Area: None, normal Jaw: None, normal Tongue: None, normal,Extremity Movements Upper (arms, wrists, hands, fingers): None, normal Lower (legs, knees, ankles, toes): None, normal, Trunk Movements Neck, shoulders, hips: None, normal, Overall Severity Severity of abnormal movements (highest score from questions above): None, normal Incapacitation due to abnormal movements: None,  normal Patient's awareness of abnormal movements (rate only patient's report): No Awareness, Dental Status Current problems with teeth and/or dentures?: No Does patient usually wear dentures?: No  CIWA:  CIWA-Ar Total: 0 COWS:  COWS Total Score: 2  Treatment Plan Summary: Daily contact with patient to assess and evaluate symptoms and progress in treatment Medication management  Plan:1. Continue crisis management and stabilization. 2. Medication management to reduce current symptoms to base line and improve the patient's overall level of functioning 3. Treat health problems as indicated. 4. Develop treatment plan to decrease risk of relapse upon discharge and the need for readmission. 5. Psycho-social education regarding relapse prevention and self care. 6. Health care follow up as needed for medical problems. 7. Restart home medications where appropriate. 8. Continue Trazodone to 150mg  po Qhs for insomnia 9. Continue Risperidone to 2mg  po Qhs for paranoia 10. Add Novolog moderate SSI for improved glycemic control.   Medical Decision Making Problem Points:  Established problem, improving (1), Review of last therapy session (1) and Review of psycho-social stressors (1) Data Points:  Order Aims Assessment (2) Review of medication regiment & side effects (2) Review of new medications or change in dosage (2)  I certify that inpatient services furnished can reasonably be expected to improve the patient's condition.   Fransisca Kaufmann, NP-C 09/01/2013, 2:53 PM

## 2013-09-01 NOTE — [facility] Group Notes (Signed)
[facility] Group Notes:  (Counselor/Nursing/MHT/Case Management/Adjunct)  09/01/2013 1:15PM  Type of Therapy:  Group Therapy  Participation Level:  Active  Participation Quality:  Appropriate  Affect:  Flat  Cognitive:  Oriented  Insight:  Improving  Engagement in Group:  Limited  Engagement in Therapy:  Limited  Modes of Intervention:  Discussion, Exploration and Socialization  Summary of Progress/Problems: The topic for group was balance in life.  Pt participated in the discussion about when their life was in balance and out of balance and how this feels.  Pt discussed ways to get back in balance and short term goals they can work on to get where they want to be. When others were talking about feeling out of balance because of hearing voices, Trey Paula echoed this, and became tearful.  York Spaniel he had never known others who admitted to this before, and became tearful as he was talking about it.  States the support of his family helps him find balance, but adds that he does not know why they continue to support him because of his expressed anger towards them.   Daryel Gerald B 09/01/2013 11:45 AM

## 2013-09-02 LAB — GLUCOSE, CAPILLARY
Glucose-Capillary: 52 mg/dL — ABNORMAL LOW (ref 70–99)
Glucose-Capillary: 155 mg/dL — ABNORMAL HIGH (ref 70–99)
Glucose-Capillary: 124 mg/dL — ABNORMAL HIGH (ref 70–99)
Glucose-Capillary: 194 mg/dL — ABNORMAL HIGH (ref 70–99)
Glucose-Capillary: 86 mg/dL (ref 70–99)

## 2013-09-02 NOTE — Progress Notes (Addendum)
Patient in his room during this assessment. He reported feeling sweaty and shaky. He had a wet towel and was wiping his face with it. Writer delegated the [facility] Tech on the hall way to check the glucose. CBG checked and it was 51. Writer administered 3 glucose tablets and asked the Tech to recheck it in 15 minutes. Writer also notified the PA on call. He advised that we hold all HS insulin. Patient denied SI/Hi and denied hallucinations. Q 15 minute check continues as ordered to maintain safety.

## 2013-09-02 NOTE — Progress Notes (Signed)
Adult Psychoeducational Group Note  Date:  09/02/2013 Time:  1:32 AM  Group Topic/Focus:  Karaoke group  Participation Level:  Active  Participation Quality:  Appropriate  Affect:  Appropriate  Cognitive:  Appropriate  Insight: Appropriate  Engagement in Group:  Engaged  Modes of Intervention:  Activity  Additional Comments:  Pt attended and participated in karaoke group this evening.    Auron Tadros A 09/02/2013, 1:32 AM

## 2013-09-02 NOTE — Progress Notes (Signed)
Recreation Therapy Notes  Date: 10.24.2014 Time: 9:30am Location: [address] Dayroom   Group Topic: Decision Making  Goal Area(s) Addresses:  Patient will verbalize benefit of using good decision making skills. Patient will identify method of good decision making.  Behavioral Response: Engaged, Attentive, Appropriate  Intervention: Mind Mapping  Activity: Patients were asked who they would want with them if they were stranded on a deserted [place]. Patients were then asked to identify why they chose those people. Following that they were asked to identify what guided the choices they made.     Education: Decision Making, Coping Skills, Discharge Planning  Education Outcome: Acknowledges understanding  Clinical Observations/Feedback: Patient actively engaged in group activity. Patient with peers identified Elam Dutch and Babbitt as the individuals they would like to have on a deserted [place] with them. Patient made no additional contributions to group discussion, but appeared to actively listen as he maintained appropriate eye contact with speaker.     Marykay Lex Doyne Ellinger, LRT/CTRS  Jearl Klinefelter 09/02/2013 12:25 PM

## 2013-09-02 NOTE — Progress Notes (Signed)
Psychoeducational Group Note  Date:  09/02/2013 Time:  2000  Group Topic/Focus:  Wrap-Up Group:   The focus of this group is to help patients review their daily goal of treatment and discuss progress on daily workbooks.  Participation Level: Did Not Attend  Participation Quality:  Not Applicable  Affect:  Not Applicable  Cognitive:  Not Applicable  Insight:  Not Applicable  Engagement in Group: Not Applicable  Additional Comments:  The patient did not attend group this evening since he wasn't feeling well (sweaty, shaky).   Hazle Coca S 09/02/2013, 11:26 PM

## 2013-09-02 NOTE — [facility] Group Notes (Signed)
[facility] LCSW Aftercare Discharge Planning Group Note   09/02/2013 10:10 AM  Participation Quality:  Active   Mood/Affect:  Appropriate  Depression Rating:    Anxiety Rating:    Thoughts of Suicide:  No Will you contract for safety?   NA  Current AVH:  No  Plan for Discharge/Comments:  [name] stated that he feels better today than yesterday.  Does not endorsed AVH, since yesterday when he was able to hear the sounds of [place] from last night's outside activity.  He stated that it was a relief to hear something other than the voices and noises.  [name] was able to identify that medication helped him.  His medication is through Dr. Florentina Jenny and he is also seeking a therapist at [facility] outpatient unit.    Transportation Means: family   Supports:  Percell Belt, Inetta Fermo

## 2013-09-02 NOTE — Progress Notes (Signed)
Hypoglycemic Event  CBG: 51 Treatment: 3 glucose tabs  Symptoms: Sweaty and Shaky  Follow-up CBG: Time:2015 CBG Result:86 Possible Reasons for Event: Medication regimen: Patient receiving large dose of Lantus (70u) with scheduled dose of Novolin at HS and sliding  scale plus schudled dose of Novolog during the day shift.   Comments/MD notified:Charles PA    [name]  Remember to initiate Hypoglycemia Order Set & complete

## 2013-09-02 NOTE — [facility] Group Notes (Signed)
[facility] LCSW Group Therapy  09/02/2013 3:09 PM  Type of Therapy:  Group Therapy   Participation Level:  Active  Participation Quality:  Appropriate  Affect:  Appropriate  Cognitive:  Appropriate  Insight:  Engaged  Engagement in Therapy:  Engaged  Modes of Intervention:  Discussion and Socialization   Summary of Progress/Problems:  Lead Child psychotherapist and Warden/ranger was here to lead a group on themes of hope and courage. [name] stated that he has hope now since being in this facility.  The medication is working well for him and he does not hear voices anymore.  He stated that hearing all of the other group member's point of views has helped him to understand that there are positives and negatives in any situation.  Before this facility, he stated that he had very negative thoughts, but now he is able to think more positively.     [name]   09/02/2013  3:09 PM

## 2013-09-02 NOTE — Progress Notes (Signed)
Patient ID: [name], male   DOB: 01-29-1976, 37 y.o.   MRN: [id number] D. Patient presents with depressed mood, affect congruent. Patient states ''Well I feel a little better, I did sleep good last night but I'm just really anxious this morning. Patient denies any auditory hallucinations at this time. Completed self inventory and rates depression at 3/10 on depression scale, 10 being worst depression 1 being least. Patient continues to endorse poor energy, and anxiety. A. Support and encouragement provided. Medications given as ordered. R. Patient interactive in dayroom , in no acute distress at this time. Will continue to monitor q 15 minutes for safety.

## 2013-09-02 NOTE — Progress Notes (Signed)
Patient ID: [name], male   DOB: 05-06-1976, 37 y.o.   MRN: [id number] [facility] MD Progress Note  09/02/2013 10:56 AM DARROLL BREDESON  MRN:  [id number] Subjective: "I have been sleeping better but I am still  Feeling anxious." Objective: Patient reports that he has been sleeping much better but he remains anxious, fidgety, depressed and has  low energy level and  lack of motivation. He reports decreased irritability and paranoid thinking. He denies psychosis but reports intermittent suicidal thoughts. He is compliant with his medications and has not endorsed any adverse reactions. 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  HENT: Negative.   Eyes: Negative.   Respiratory: Negative.   Cardiovascular: Negative.   Gastrointestinal: Negative.   Genitourinary: Negative.   Musculoskeletal: Positive for joint pain.  Skin: Negative.   Neurological: Positive for tremors.  Endo/Heme/Allergies: Negative.   Psychiatric/Behavioral: Positive for depression, suicidal ideas and hallucinations. The patient is nervous/anxious.     Blood pressure 144/86, pulse 118, temperature 97.4 F (36.3 C), temperature source Oral, resp. rate 18, height 6' (1.829 m), weight 100.245 kg (221 lb), SpO2 96.00%.Body mass index is 29.97 kg/(m^2).  General Appearance: Fairly Groomed  Patent attorney::  Minimal  Speech:   Slow and Slurred  Volume:  Decreased  Mood:  Anxious, Depressed and Hopeless  Affect:  Blunt  Thought Process:  Circumstantial  Orientation:  Full (Time, Place, and Person)  Thought Content:  Paranoid Ideation  Suicidal Thoughts:  Yes.  without intent/plan  Homicidal Thoughts:  No  Memory:  Immediate;   Fair Recent;   Fair Remote;   Fair  Judgement:  Poor  Insight:  Lacking  Psychomotor Activity:  Decreased  Concentration:  Fair  Recall:  Fair  Akathisia:  No  Handed:  Right  AIMS (if indicated):     Assets:  Financial Resources/Insurance Social Support  Sleep:  Number of Hours: 5.5   Current Medications: Current Facility-Administered Medications  Medication Dose Route Frequency Provider Last Rate Last Dose  . acetaminophen (TYLENOL) tablet 650 mg  650 mg Oral Q6H PRN Earney Navy, NP      . albuterol (PROVENTIL HFA;VENTOLIN HFA) 108 (90 BASE) MCG/ACT inhaler 2 puff  2 puff Inhalation Q6H PRN Earney Navy, NP      . alum & mag hydroxide-simeth (MAALOX/MYLANTA) 200-200-20 MG/5ML suspension 30 mL  30 mL Oral Q4H PRN Earney Navy, NP      . busPIRone (BUSPAR) tablet 15 mg  15 mg Oral TID Deegan Valentino   15 mg at 09/02/13 0805  . DULoxetine (CYMBALTA) DR capsule 60 mg  60 mg Oral Daily Earney Navy, NP   60 mg at 09/02/13 0805  . hydrOXYzine (ATARAX/VISTARIL) tablet 50 mg  50 mg Oral Q6H PRN Earney Navy, NP   50 mg at 09/02/13 0805  . insulin aspart (novoLOG) injection 0-15 Units  0-15 Units Subcutaneous TID WC Fransisca Kaufmann, NP  3 Units at 09/02/13 0703  . insulin aspart (novoLOG) injection 5 Units  5 Units Subcutaneous TID WC Earney Navy, NP   5 Units at 09/02/13 0704  . insulin glargine (LANTUS) injection 70 Units  70 Units Subcutaneous QHS Earney Navy, NP   70 Units at 09/01/13 2211  . insulin NPH (HUMULIN N,NOVOLIN N) injection 20 Units  20 Units Subcutaneous QHS Earney Navy, NP   20 Units at 08/30/13 2119  . loperamide  (IMODIUM) capsule 4 mg  4 mg Oral PRN Earney Navy, NP      . loratadine (CLARITIN) tablet 10 mg  10 mg Oral Daily Earney Navy, NP   10 mg at 09/02/13 0805  . magnesium hydroxide (MILK OF MAGNESIA) suspension 30 mL  30 mL Oral Daily PRN Earney Navy, NP      . metFORMIN (GLUCOPHAGE) tablet 1,000 mg  1,000 mg Oral BID WC Earney Navy, NP   1,000 mg at 09/02/13 0805  . methocarbamol (ROBAXIN) tablet 750 mg  750 mg Oral TID Earney Navy, NP   750 mg at 09/02/13 0805  . nabumetone (RELAFEN) tablet 750 mg  750 mg Oral BID WC Catarina Hartshorn, MD   750 mg at 09/02/13 0805  . niacin CR capsule 1,000 mg  1,000 mg Oral Daily Yuliya Nova   1,000 mg at 09/02/13 0804  . nicotine (NICODERM CQ - dosed in mg/24 hours) patch 21 mg  21 mg Transdermal Daily Earney Navy, NP   21 mg at 09/02/13 0806  . omega-3 acid ethyl esters (LOVAZA) capsule 2 g  2 g Oral BID Flint Melter, MD   2 g at 09/02/13 0804  . ondansetron (ZOFRAN) tablet 4 mg  4 mg Oral Q8H PRN Earney Navy, NP      . risperiDONE (RISPERDAL M-TABS) disintegrating tablet 2 mg  2 mg Oral QHS Kerry Hough, PA-C   2 mg at 09/01/13 2205  . simvastatin (ZOCOR) tablet 20 mg  20 mg Oral q1800 Earney Navy, NP   20 mg at 09/01/13 1718  . Tapentadol HCl TB12 250 mg  250 mg Oral Q12H Brynn Mulgrew   250 mg at 09/02/13 1610  . traZODone (DESYREL) tablet 150 mg  150 mg Oral QHS Trixie Maclaren   150 mg at 09/01/13 2205    Lab Results:  Results for orders placed during the hospital encounter of 08/29/13 (from the past 48 hour(s))  GLUCOSE, CAPILLARY     Status: Abnormal   Collection Time    08/31/13 11:33 AM      Result Value Range   Glucose-Capillary 170 (*) 70 - 99 mg/dL  GLUCOSE, CAPILLARY     Status: Abnormal   Collection Time    08/31/13  4:49 PM      Result Value Range   Glucose-Capillary 252 (*) 70 - 99 mg/dL  GLUCOSE, CAPILLARY     Status: Abnormal   Collection Time    08/31/13  8:58 PM       Result Value Range   Glucose-Capillary 127 (*) 70 - 99 mg/dL  GLUCOSE, CAPILLARY     Status: Abnormal   Collection Time    09/01/13  4:52 AM      Result Value Range   Glucose-Capillary 141 (*) 70 - 99 mg/dL  GLUCOSE, CAPILLARY     Status: Abnormal   Collection Time    09/01/13  6:31 AM      Result  Value Range   Glucose-Capillary 224 (*) 70 - 99 mg/dL  GLUCOSE, CAPILLARY     Status: Abnormal   Collection Time    09/01/13 11:51 AM      Result Value Range   Glucose-Capillary 186 (*) 70 - 99 mg/dL  GLUCOSE, CAPILLARY     Status: Abnormal   Collection Time    09/01/13  4:50 PM      Result Value Range   Glucose-Capillary 194 (*) 70 - 99 mg/dL   Comment 1 Documented in Chart     Comment 2 Notify RN    GLUCOSE, CAPILLARY     Status: Abnormal   Collection Time    09/01/13  9:52 PM      Result Value Range   Glucose-Capillary 190 (*) 70 - 99 mg/dL  GLUCOSE, CAPILLARY     Status: Abnormal   Collection Time    09/02/13  6:12 AM      Result Value Range   Glucose-Capillary 52 (*) 70 - 99 mg/dL  GLUCOSE, CAPILLARY     Status: Abnormal   Collection Time    09/02/13  6:39 AM      Result Value Range   Glucose-Capillary 155 (*) 70 - 99 mg/dL    Physical Findings: AIMS: Facial and Oral Movements Muscles of Facial Expression: None, normal Lips and Perioral Area: None, normal Jaw: None, normal Tongue: None, normal,Extremity Movements Upper (arms, wrists, hands, fingers): None, normal Lower (legs, knees, ankles, toes): None, normal, Trunk Movements Neck, shoulders, hips: None, normal, Overall Severity Severity of abnormal movements (highest score from questions above): None, normal Incapacitation due to abnormal movements: None, normal Patient's awareness of abnormal movements (rate only patient's report): No Awareness, Dental Status Current problems with teeth and/or dentures?: No Does patient usually wear dentures?: No  CIWA:  CIWA-Ar Total: 0 COWS:  COWS Total Score: 2  Treatment  Plan Summary: Daily contact with patient to assess and evaluate symptoms and progress in treatment Medication management  Plan:1. Admit for crisis management and stabilization. 2. Medication management to reduce current symptoms to base line and improve the     patient's overall level of functioning 3. Treat health problems as indicated. 4. Develop treatment plan to decrease risk of relapse upon discharge and the need for     readmission. 5. Psycho-social education regarding relapse prevention and self care. 6. Health care follow up as needed for medical problems. 7. Restart home medications where appropriate. 8. Will continue Trazodone 150mg  po Qhs for insomnia 9. Continue Risperidone  2mg  po Qhs for paranoia   Medical Decision Making Problem Points:  Established problem, improving (1), Review of last therapy session (1) and Review of psycho-social stressors (1) Data Points:  Order Aims Assessment (2) Review of medication regiment & side effects (2) Review of new medications or change in dosage (2)  I certify that inpatient services furnished can reasonably be expected to improve the patient's condition.   Thedore Mins, MD 09/02/2013, 10:56 AM

## 2013-09-02 NOTE — Progress Notes (Signed)
0206  D: Pt presented with anxious affect this morning. He reports waking up with anxiety. No nightmares or other contributing factors verbalized.  A: Writer administered Vistaril 50 mg for this pt's anxiety. Continued support and availability as needed was extended to this pt. Staff continue to monitor pt with q54min checks.  R: No adverse drug reactions noted. [name] anxiety was decreased.. Pt remains safe at this time.

## 2013-09-02 NOTE — Progress Notes (Signed)
Patient ID: [name], male   DOB: 1976/05/30, 37 y.o.   MRN: Hypoglycemic Event  CBG: 52  Treatment: 3 glucose tabs  Symptoms: Sweaty  Follow-up CBG: ZOXW:9604 CBG Result:155  Possible Reasons for Event:  70 lantus at QHS and 20 NPH. 20 NPH refused and not administered on 10/23.   Comments/MD notified:Lugo, MD to continue morning Insulin regimen as ordered prior to breakfast. Continue to monitor pt for signs of hypoglycemia.    Naisha Wisdom A  Remember to initiate Hypoglycemia Order Set & complete

## 2013-09-03 DIAGNOSIS — F323 Major depressive disorder, single episode, severe with psychotic features: Secondary | ICD-10-CM

## 2013-09-03 DIAGNOSIS — F411 Generalized anxiety disorder: Secondary | ICD-10-CM

## 2013-09-03 LAB — GLUCOSE, CAPILLARY
Glucose-Capillary: 146 mg/dL — ABNORMAL HIGH (ref 70–99)
Glucose-Capillary: 233 mg/dL — ABNORMAL HIGH (ref 70–99)
Glucose-Capillary: 85 mg/dL (ref 70–99)

## 2013-09-03 MED ORDER — GLUCOSE-VITAMIN C 4-6 GM-MG PO CHEW: 4.0000 | CHEWABLE_TABLET | ORAL | Status: DC | PRN

## 2013-09-03 MED ORDER — INSULIN GLARGINE 100 UNIT/ML ~~LOC~~ SOLN: 60.0000 [IU] | Freq: Every day | SUBCUTANEOUS | Status: DC

## 2013-09-03 MED ORDER — BENZTROPINE MESYLATE 0.5 MG PO TABS
0.5000 mg | ORAL_TABLET | Freq: Two times a day (BID) | ORAL | Status: DC
  Administered 2013-09-03 – 2013-09-04 (×2): 0.5 mg via ORAL
  Filled 2013-09-03 (×8): qty 1

## 2013-09-03 MED ORDER — BENZTROPINE MESYLATE 2 MG PO TABS
2.0000 mg | ORAL_TABLET | Freq: Every day | ORAL | Status: DC
  Filled 2013-09-03 (×2): qty 1

## 2013-09-03 NOTE — Progress Notes (Signed)
Patient ID: [name], male   DOB: 1976-07-14, 37 y.o.   MRN: [id number] D. Patient presents with depressed mood, affect congruent. Patient states ''I feel okay, I'm feeling better, each day I can see a little improvement. You know being here and seeing what other people don't have helps you realize how much you have. I just need to learn how to cope with my anxiety without taking the klonopin or taking a pill. I was on cocaine before but nothing is as addictive as this klonopin .''  Completed self inventory and rates depression at 3/10 on depression scale again today, 10 being worst depression 1 being least.  A. Support and encouragement provided. Discussed healthy coping skills for anxiety. Medications given as ordered. R. Patient interactive in dayroom , in no acute distress at this time. Will continue to monitor q 15 minutes for safety.

## 2013-09-03 NOTE — Progress Notes (Signed)
[facility] MD Progress Note  09/03/2013 2:07 PM [name]  MRN:  [id number] Subjective:  [name] endorses that he is making progress.  He reports hi depression is a 3 on a scale of 1 - 10 where 10 is the worst.  He endorses slightly more anxiety, and rates it as a 7 on a scale of 1 -10.  He reports he is sleeping and eating well.  He denies any SI/HI or AVH.  We discussed his CBGs and the concern about them dropping too low.  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   HENT: Negative.   Eyes: Negative.   Respiratory: Negative.   Cardiovascular: Negative.   Gastrointestinal: Negative.   Genitourinary: Negative.   Musculoskeletal: Negative.   Skin: Negative.   Neurological: Negative.   Endo/Heme/Allergies: Negative.   Psychiatric/Behavioral: Negative for suicidal ideas and hallucinations. The patient is nervous/anxious.     Blood pressure 124/82, pulse 112, temperature 97.1 F (36.2 C), temperature source Oral, resp. rate 16, height 6' (1.829 m), weight 100.245 kg (221 lb), SpO2 96.00%.Body mass index is 29.97 kg/(m^2).  General Appearance: Casual  Eye Contact::  Good  Speech:  Clear and Coherent  Volume:  Normal  Mood:  Depressed  Affect:  Flat  Thought Process:  Linear  Orientation:  Full (Time, Place, and Person)  Thought Content:  WDL  Suicidal Thoughts:  No  Homicidal Thoughts:  No  Memory:  Immediate;   Good  Judgement:  Fair  Insight:  Fair   Psychomotor Activity:  Psychomotor Retardation  Concentration:  Good  Recall:  Good  Akathisia:  No  Handed:  Right  AIMS (if indicated):     Assets:  Communication Skills Desire for Improvement  Sleep:  Number of Hours: 6.25   Current Medications: Current Facility-Administered Medications  Medication Dose Route Frequency Provider Last Rate Last Dose  . acetaminophen (TYLENOL) tablet 650 mg  650 mg Oral Q6H PRN Earney Navy, NP      . albuterol (PROVENTIL HFA;VENTOLIN HFA) 108 (90 BASE) MCG/ACT inhaler 2 puff  2 puff Inhalation Q6H PRN Earney Navy, NP      . alum & mag hydroxide-simeth (MAALOX/MYLANTA) 200-200-20 MG/5ML suspension 30 mL  30 mL Oral Q4H PRN Earney Navy, NP      . busPIRone (BUSPAR) tablet 15 mg  15 mg Oral TID Mojeed Akintayo   15 mg at 09/03/13 1204  . DULoxetine (CYMBALTA) DR capsule 60 mg  60 mg Oral Daily Earney Navy, NP   60 mg at 09/03/13 0755  . hydrOXYzine (ATARAX/VISTARIL) tablet 50 mg  50 mg Oral Q6H PRN Earney Navy, NP   50 mg at 09/03/13 1330  . insulin aspart (novoLOG) injection 0-15 Units  0-15 Units Subcutaneous TID WC Fransisca Kaufmann, NP   5 Units at 09/03/13 1204  . insulin aspart (novoLOG) injection 5 Units  5 Units Subcutaneous TID WC Earney Navy, NP   5 Units at 09/03/13 1204  . insulin glargine (LANTUS) injection 60 Units  60 Units Subcutaneous QHS Jorje Guild,  PA-C      . loperamide (IMODIUM) capsule 4 mg  4 mg Oral PRN Earney Navy, NP      . loratadine (CLARITIN) tablet 10 mg  10 mg Oral Daily Earney Navy, NP   10 mg at 09/03/13 0755  . magnesium hydroxide (MILK OF MAGNESIA) suspension 30 mL  30 mL Oral Daily PRN Earney Navy, NP      . metFORMIN (GLUCOPHAGE) tablet 1,000 mg  1,000 mg Oral BID WC Earney Navy, NP   1,000 mg at 09/03/13 0755  . methocarbamol (ROBAXIN) tablet 750 mg  750 mg Oral TID Earney Navy, NP   750 mg at 09/03/13 1204  . nabumetone (RELAFEN) tablet 750 mg  750  mg Oral BID WC Catarina Hartshorn, MD   750 mg at 09/03/13 0755  . niacin CR capsule 1,000 mg  1,000 mg Oral Daily Mojeed Akintayo   1,000 mg at 09/03/13 0755  . nicotine (NICODERM CQ - dosed in mg/24 hours) patch 21 mg  21 mg Transdermal Daily Earney Navy, NP   21 mg at 09/03/13 0754  . omega-3 acid ethyl esters (LOVAZA) capsule 2 g  2 g Oral BID Flint Melter, MD   2 g at 09/03/13 0755  . ondansetron (ZOFRAN) tablet 4 mg  4 mg Oral Q8H PRN Earney Navy, NP      . risperiDONE (RISPERDAL M-TABS) disintegrating tablet 2 mg  2 mg Oral QHS Kerry Hough, PA-C   2 mg at 09/02/13 2124  . simvastatin (ZOCOR) tablet 20 mg  20 mg Oral q1800 Earney Navy, NP   20 mg at 09/02/13 1718  . Tapentadol HCl TB12 250 mg  250 mg Oral Q12H Mojeed Akintayo   250 mg at 09/03/13 0757  . traZODone (DESYREL) tablet 150 mg  150 mg Oral QHS Mojeed Akintayo   150 mg at 09/02/13 2124    Lab Results:  Results for orders placed during the hospital encounter of 08/29/13 (from the past 48 hour(s))  GLUCOSE, CAPILLARY     Status: Abnormal   Collection Time    09/01/13  4:50 PM      Result Value Range   Glucose-Capillary 194 (*) 70 - 99 mg/dL   Comment 1 Documented in Chart     Comment 2 Notify RN    GLUCOSE, CAPILLARY     Status: Abnormal   Collection Time    09/01/13  9:52 PM      Result Value Range   Glucose-Capillary 190 (*) 70 - 99 mg/dL  GLUCOSE, CAPILLARY     Status: Abnormal   Collection Time    09/02/13  6:12 AM      Result Value Range   Glucose-Capillary 52 (*) 70 - 99 mg/dL  GLUCOSE, CAPILLARY     Status: Abnormal   Collection Time    09/02/13  6:39 AM      Result Value Range   Glucose-Capillary 155 (*) 70 - 99 mg/dL  GLUCOSE, CAPILLARY     Status: Abnormal   Collection Time    09/02/13 12:00 PM      Result Value Range   Glucose-Capillary 124 (*) 70 - 99 mg/dL  GLUCOSE, CAPILLARY     Status: Abnormal   Collection Time    09/02/13  5:14 PM      Result Value Range   Glucose-Capillary  194 (*) 70 - 99 mg/dL  GLUCOSE, CAPILLARY     Status: Abnormal  Collection Time    09/02/13  7:52 PM      Result Value Range   Glucose-Capillary 51 (*) 70 - 99 mg/dL   Comment 1 Notify RN    GLUCOSE, CAPILLARY     Status: None   Collection Time    09/02/13  8:24 PM      Result Value Range   Glucose-Capillary 86  70 - 99 mg/dL  GLUCOSE, CAPILLARY     Status: Abnormal   Collection Time    09/03/13  6:00 AM      Result Value Range   Glucose-Capillary 146 (*) 70 - 99 mg/dL  GLUCOSE, CAPILLARY     Status: Abnormal   Collection Time    09/03/13 11:45 AM      Result Value Range   Glucose-Capillary 215 (*) 70 - 99 mg/dL   Comment 1 Documented in Chart     Comment 2 Notify RN      Physical Findings: AIMS: Facial and Oral Movements Muscles of Facial Expression: None, normal Lips and Perioral Area: None, normal Jaw: None, normal Tongue: None, normal,Extremity Movements Upper (arms, wrists, hands, fingers): None, normal Lower (legs, knees, ankles, toes): None, normal, Trunk Movements Neck, shoulders, hips: None, normal, Overall Severity Severity of abnormal movements (highest score from questions above): None, normal Incapacitation due to abnormal movements: None, normal Patient's awareness of abnormal movements (rate only patient's report): No Awareness, Dental Status Current problems with teeth and/or dentures?: No Does patient usually wear dentures?: No  CIWA:  CIWA-Ar Total: 0 COWS:  COWS Total Score: 2  Treatment Plan Summary: Daily contact with patient to assess and evaluate symptoms and progress in treatment Medication management  Plan: 1. Continue crisis management and stabilization.  2. Medication management to reduce current symptoms to base line and improve the patient's overall level of functioning  3. Treat health problems as indicated.  4. Develop treatment plan to decrease risk of relapse upon discharge and the need for readmission.  5. Psycho-social education  regarding relapse prevention and self care.  6. Health care follow up as needed for medical problems.  7. Restart home medications where appropriate.  8. Will continue Trazodone 150mg  po Qhs for insomnia  9. Continue Risperidone 2mg  po Qhs for paranoia  Medical Decision Making Problem Points:  Established problem, stable/improving (1) and Review of psycho-social stressors (1) Data Points:  Review or order clinical lab tests (1) Review of medication regiment & side effects (2)  I certify that inpatient services furnished can reasonably be expected to improve the patient's condition.   WATT,ALAN 09/03/2013, 2:07 PM Agree with assessment and plan Madie Reno A. Dub Mikes, M.D.

## 2013-09-03 NOTE — Progress Notes (Signed)
Patient ID: [name], male   DOB: 24-Jun-1976, 37 y.o.   MRN: [id number] D. Patient complained of anxiety, stating '' I get these spells where the thoughts start racing and bouncing in my head. A. Prn medication given. R. Will continue to monitor q 15 minutes for safety.

## 2013-09-03 NOTE — Progress Notes (Signed)
[facility] Group Notes:  (Nursing/MHT/Case Management/Adjunct)  Date:  09/03/2013  Time:  2000  Type of Therapy:  Psychoeducational Skills  Participation Level:  Active  Participation Quality:  Appropriate  Affect:  Appropriate  Cognitive:  Appropriate  Insight:  Good  Engagement in Group:  Improving  Modes of Intervention:  Education  Summary of Progress/Problems: The patient stated in group that he had a better day for a number of reasons. First of all, he mentioned that he enjoyed speaking with the [employer] [facility] [employer] student earlier in the day. Secondly, he verbalized that he spoke with the staff regarding a side effect that he experienced with his medication. Although the issue has already been addressed, he is still concerned about taking medication that causes side effects. Finally, the patient shared with the group that his anxiety decreased today and that he was proud of himself for taking less anti-anxiety medication. His goal for tomorrow is to breath and go take a walk as a means of coping with his anxiety. As for a theme, his coping skill will be to go fishing and enjoy the outdoors.   Hazle Coca S 09/03/2013, 9:34 PM

## 2013-09-03 NOTE — [facility] Group Notes (Signed)
[facility] Group Notes:  (Nursing/MHT/Case Management/Adjunct)  Date:  09/03/2013  Time:  10:18 AM  Type of Therapy:  Psychoeducational Skills  Participation Level:  Active  Participation Quality:  Appropriate and Supportive  Affect:  Anxious  Cognitive:  Alert and Appropriate  Insight:  Appropriate  Engagement in Group:  Engaged  Modes of Intervention:  Discussion and Education  Summary of Progress/Problems: psycho-educational skills -reviewed self inventory with RN, and reviewed healthy coping skills.   Malva Limes 09/03/2013, 10:18 AM

## 2013-09-03 NOTE — [facility] Group Notes (Signed)
[facility] Group Notes:  (Clinical Social Work)  09/03/2013  11:15-11:45AM  Summary of Progress/Problems:   The main focus of today's process group was for the patient to identify ways in which they have in the past sabotaged their own recovery and reasons they may have done this/what they received from doing it.  We then worked to identify a specific plan to avoid doing this when discharged from the hospital for this admission.  The patient expressed that he has had some blackouts when in the middle of the "high" part of his bipolar disorder.  He state he tried some groups but did not return because his illness told him that "nobody could help."  He is willing to try again, as he understands that he has things in common with other people with the same illness and they could help each other.  Type of Therapy:  Group Therapy - Process  Participation Level:  Active  Participation Quality:  Attentive, Sharing and Supportive  Affect:  Anxious and Blunted  Cognitive:  Appropriate  Insight:  Developing/Improving  Engagement in Therapy:  Engaged  Modes of Intervention:  Clarification, Education, Exploration, Discussion  Ambrose Mantle, LCSW 09/03/2013, 1:03 PM

## 2013-09-04 LAB — GLUCOSE, CAPILLARY
Glucose-Capillary: 119 mg/dL — ABNORMAL HIGH (ref 70–99)
Glucose-Capillary: 183 mg/dL — ABNORMAL HIGH (ref 70–99)

## 2013-09-04 MED ORDER — BENZTROPINE MESYLATE 1 MG PO TABS
1.0000 mg | ORAL_TABLET | Freq: Once | ORAL | Status: AC
  Administered 2013-09-04: 1 mg via ORAL
  Filled 2013-09-04 (×2): qty 1

## 2013-09-04 MED ORDER — BENZTROPINE MESYLATE 1 MG PO TABS
1.0000 mg | ORAL_TABLET | Freq: Two times a day (BID) | ORAL | Status: DC
  Administered 2013-09-04 – 2013-09-07 (×6): 1 mg via ORAL
  Filled 2013-09-04: qty 1
  Filled 2013-09-04: qty 6
  Filled 2013-09-04 (×2): qty 1
  Filled 2013-09-04: qty 6
  Filled 2013-09-04 (×6): qty 1

## 2013-09-04 NOTE — [facility] Group Notes (Signed)
[facility] Group Notes:  (Nursing/MHT/Case Management/Adjunct)  Date:  09/04/2013  Time:  10:31 AM Type of Therapy: Psychoeducational Skills  Participation Level: Active  Participation Quality: Appropriate and Supportive  Affect: Anxious  Cognitive: Alert and Appropriate  Insight: Appropriate  Engagement in Group: Engaged  Modes of Intervention: Discussion and Education  Summary of Progress/Problems: psycho-educational skills -reviewed healthy support systems self inventory with RN. Pt able to identify unhealthy supports and patterns in his life stating ''well I keep raging and having these blackouts and I don't need to keep choosing that for my life''   Malva Limes 09/04/2013, 10:31 AM

## 2013-09-04 NOTE — [facility] Group Notes (Signed)
[facility] Group Notes:  (Clinical Social Work)  09/04/2013   11:15am-12:00pm  Summary of Progress/Problems:  The main focus of today's process group was to listen to a variety of genres of music and to identify that different types of music provoke different responses.  The patient then was able to identify personally what was soothing for them, as well as energizing.  Handouts were used to record feelings evoked, as well as how patient can personally use this knowledge in sleep habits, with depression, and with other symptoms.  The patient expressed understanding of concepts, as well as knowledge of how each type of music affected them and how this can be used when they are at home as a tool in their recovery.  He was very tired and even dozed off a couple of times.  Type of Therapy:  Music Therapy   Participation Level:  Active  Participation Quality:  Attentive and Sharing  Affect:  Blunted, Drowsy and Depressed  Cognitive:  Oriented  Insight:  Engaged  Engagement in Therapy:  Limited  Modes of Intervention:   Activity, Exploration  Ambrose Mantle, LCSW 09/04/2013, 1:28 PM

## 2013-09-04 NOTE — Progress Notes (Signed)
[facility] Group Notes:  (Nursing/MHT/Case Management/Adjunct)  Date:  09/04/2013  Time:  2000  Type of Therapy:  Psychoeducational Skills  Participation Level:  Minimal  Participation Quality:  Resistant  Affect:  Flat  Cognitive:  Appropriate  Insight:  Good  Engagement in Group:  Resistant  Modes of Intervention:  Education  Summary of Progress/Problems: The patient described his day as having been "pretty good". He continues to voice his concerns over his medication and their side effects. He indicated that he felt better today with the new medication regimen. His goal for tomorrow is to speak with the doctor and case manager regarding his discharge plans. As a theme for the day, he indicated that his support system consists of his wife and children.   Hazle Coca S 09/04/2013, 8:47 PM

## 2013-09-04 NOTE — Progress Notes (Signed)
[facility] MD Progress Note  09/04/2013 2:04 PM [name]  MRN:  [id number] Subjective:  [name] reports he is feeling "wierd" today.  He states he feels "weighted down and slow motion."  He also reports difficulty urinating and feeling shaky inside.  He reports his mood is "all right."  He endorses good appetite and appetite.  He denies any SI/HI or AVH.  He rates his anxiety as a 3 on a scale of 1 - 10, where 10 is the worst, and depression as a 2 on a scale of 1 -10.  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   HENT: Negative.   Eyes: Negative.   Respiratory: Negative.   Cardiovascular: Negative.   Gastrointestinal: Negative.   Genitourinary: Negative.   Musculoskeletal: Negative.   Skin: Negative.   Neurological: Negative.   Endo/Heme/Allergies: Negative.   Psychiatric/Behavioral: Positive for depression. Negative for suicidal ideas and hallucinations. The patient is nervous/anxious.     Blood pressure 149/87, pulse 105, temperature 98.1 F (36.7 C), temperature source Oral, resp. rate 16, height 6' (1.829 m), weight 100.245 kg (221 lb), SpO2 96.00%.Body mass index is 29.97 kg/(m^2).  General Appearance: Bizarre  Eye Contact::  Good  Speech:  Clear and Coherent  Volume:  Normal  Mood:  Anxious  Affect:  Flat  Thought Process:  Linear  Orientation:  Full (Time, Place, and Person)  Thought Content:  WDL  Suicidal Thoughts:  No  Homicidal Thoughts:   No  Memory:  Immediate;   Good  Judgement:  Fair  Insight:  Fair  Psychomotor Activity:  Stiff  Concentration:  Good  Recall:  Good  Akathisia:  Yes  Handed:  Right  AIMS (if indicated):     Assets:  Communication Skills Desire for Improvement Resilience  Sleep:  Number of Hours: 6.75   Current Medications: Current Facility-Administered Medications  Medication Dose Route Frequency Provider Last Rate Last Dose  . acetaminophen (TYLENOL) tablet 650 mg  650 mg Oral Q6H PRN Earney Navy, NP      . albuterol (PROVENTIL HFA;VENTOLIN HFA) 108 (90 BASE) MCG/ACT inhaler 2 puff  2 puff Inhalation Q6H PRN Earney Navy, NP      . alum & mag hydroxide-simeth (MAALOX/MYLANTA) 200-200-20 MG/5ML suspension 30 mL  30 mL Oral Q4H PRN Earney Navy, NP      . benztropine (COGENTIN) tablet 1 mg  1 mg Oral BID Jorje Guild, PA-C      . benztropine (COGENTIN) tablet 1 mg  1 mg Oral Once Jorje Guild, PA-C      . busPIRone (BUSPAR) tablet 15 mg  15 mg Oral TID Mojeed Akintayo   15 mg at 09/04/13 1208  . DULoxetine (CYMBALTA) DR capsule 60 mg  60 mg Oral Daily Earney Navy, NP   60 mg at 09/04/13 0829  . hydrOXYzine (ATARAX/VISTARIL) tablet 50 mg  50 mg Oral Q6H PRN Earney Navy, NP   50 mg at 09/03/13 1330  . insulin aspart (novoLOG) injection 0-15 Units  0-15 Units Subcutaneous TID WC Fransisca Kaufmann, NP  3 Units at 09/04/13 1209  . insulin aspart (novoLOG) injection 5 Units  5 Units Subcutaneous TID WC Earney Navy, NP   5 Units at 09/04/13 1207  . insulin glargine (LANTUS) injection 60 Units  60 Units Subcutaneous QHS Jorje Guild, PA-C      . loperamide (IMODIUM) capsule 4 mg  4 mg Oral PRN Earney Navy, NP      . loratadine (CLARITIN) tablet 10 mg  10 mg Oral Daily Earney Navy, NP   10 mg at 09/04/13 0828  . magnesium hydroxide (MILK OF MAGNESIA) suspension 30 mL  30 mL Oral Daily PRN Earney Navy, NP      . metFORMIN (GLUCOPHAGE) tablet 1,000 mg  1,000 mg  Oral BID WC Earney Navy, NP   1,000 mg at 09/04/13 0828  . methocarbamol (ROBAXIN) tablet 750 mg  750 mg Oral TID Earney Navy, NP   750 mg at 09/04/13 1208  . nabumetone (RELAFEN) tablet 750 mg  750 mg Oral BID WC Catarina Hartshorn, MD   750 mg at 09/04/13 1610  . niacin CR capsule 1,000 mg  1,000 mg Oral Daily Mojeed Akintayo   1,000 mg at 09/04/13 0828  . nicotine (NICODERM CQ - dosed in mg/24 hours) patch 21 mg  21 mg Transdermal Daily Earney Navy, NP   21 mg at 09/04/13 0830  . omega-3 acid ethyl esters (LOVAZA) capsule 2 g  2 g Oral BID Flint Melter, MD   2 g at 09/04/13 9604  . ondansetron (ZOFRAN) tablet 4 mg  4 mg Oral Q8H PRN Earney Navy, NP      . risperiDONE (RISPERDAL M-TABS) disintegrating tablet 2 mg  2 mg Oral QHS Kerry Hough, PA-C   2 mg at 09/03/13 2144  . simvastatin (ZOCOR) tablet 20 mg  20 mg Oral q1800 Earney Navy, NP   20 mg at 09/03/13 1718  . Tapentadol HCl TB12 250 mg  250 mg Oral Q12H Mojeed Akintayo   250 mg at 09/04/13 0831  . traZODone (DESYREL) tablet 150 mg  150 mg Oral QHS Mojeed Akintayo   150 mg at 09/03/13 2144    Lab Results:  Results for orders placed during the hospital encounter of 08/29/13 (from the past 48 hour(s))  GLUCOSE, CAPILLARY     Status: Abnormal   Collection Time    09/02/13  5:14 PM      Result Value Range   Glucose-Capillary 194 (*) 70 - 99 mg/dL  GLUCOSE, CAPILLARY     Status: Abnormal   Collection Time    09/02/13  7:52 PM      Result Value Range   Glucose-Capillary 51 (*) 70 - 99 mg/dL   Comment 1 Notify RN    GLUCOSE, CAPILLARY     Status: None   Collection Time    09/02/13  8:24 PM      Result Value Range   Glucose-Capillary 86  70 - 99 mg/dL  GLUCOSE, CAPILLARY     Status: Abnormal   Collection Time    09/03/13  6:00 AM      Result Value Range   Glucose-Capillary 146 (*) 70 - 99 mg/dL  GLUCOSE, CAPILLARY     Status: Abnormal   Collection Time    09/03/13 11:45 AM      Result Value Range    Glucose-Capillary 215 (*) 70 - 99 mg/dL   Comment 1 Documented in Chart  Comment 2 Notify RN    GLUCOSE, CAPILLARY     Status: Abnormal   Collection Time    09/03/13  5:10 PM      Result Value Range   Glucose-Capillary 233 (*) 70 - 99 mg/dL   Comment 1 Documented in Chart     Comment 2 Notify RN    GLUCOSE, CAPILLARY     Status: None   Collection Time    09/03/13  9:11 PM      Result Value Range   Glucose-Capillary 85  70 - 99 mg/dL  GLUCOSE, CAPILLARY     Status: Abnormal   Collection Time    09/04/13  6:22 AM      Result Value Range   Glucose-Capillary 119 (*) 70 - 99 mg/dL   Comment 1 Notify RN     Comment 2 Documented in Chart      Physical Findings: AIMS: Facial and Oral Movements Muscles of Facial Expression: None, normal Lips and Perioral Area: None, normal Jaw: None, normal Tongue: None, normal,Extremity Movements Upper (arms, wrists, hands, fingers): None, normal Lower (legs, knees, ankles, toes): None, normal, Trunk Movements Neck, shoulders, hips: None, normal, Overall Severity Severity of abnormal movements (highest score from questions above): None, normal Incapacitation due to abnormal movements: None, normal Patient's awareness of abnormal movements (rate only patient's report): No Awareness, Dental Status Current problems with teeth and/or dentures?: No Does patient usually wear dentures?: No  CIWA:  CIWA-Ar Total: 0 COWS:  COWS Total Score: 2  Treatment Plan Summary: Daily contact with patient to assess and evaluate symptoms and progress in treatment Medication management  Plan: 1. Continue crisis management and stabilization.  2. Medication management to reduce current symptoms to base line and improve the patient's overall level of functioning  3. Treat health problems as indicated.  4. Develop treatment plan to decrease risk of relapse upon discharge and the need for readmission.  5. Psycho-social education regarding relapse prevention and  self care.  6. Health care follow up as needed for medical problems.  7. Restart home medications where appropriate.  8. Will continue Trazodone 150mg  po Qhs for insomnia  9. Increase Cogentin to 1 mg bid 10. Continue Risperidone 2mg  po Qhs for paranoia  Medical Decision Making Problem Points:  Established problem, stable/improving (1) and Review of psycho-social stressors (1) Data Points:  Review or order clinical lab tests (1) Review of medication regiment & side effects (2) Review of new medications or change in dosage (2)  I certify that inpatient services furnished can reasonably be expected to improve the patient's condition.   WATT,ALAN 09/04/2013, 2:04 PM Agree with assessment and plan Madie Reno A. Dub Mikes, M.D.

## 2013-09-04 NOTE — Progress Notes (Addendum)
Pt states he has had a good evening. Back pain was a 4 prior to admin of scheduled pain med. On reassess it dropped to a 3/10 which pt indicates is typically his baseline. He is anxious in affect and mood. He refused hs lantus over concerns of his blood sugar. Explained insulin orders were changed and that lantus is long acting however pt still refused. Medicated per orders without difficulty. Offered support, encouragement. Pt states stiffness has improved since cogentin was initiated. Denies SI/HI/AVH and remains safe. Lawrence Marseilles

## 2013-09-04 NOTE — Progress Notes (Signed)
Pt reports having an "okay" day. Pain is well managed at a 3/10. He remains anxious in affect with congruent mood. Up and visible on unit. Did complain of some nausea but it was relieved with zofran prn. States some mild stiffness remains, particularly in his legs but the cogentin is helping. He is pleasant and cooperative. Supported and encouraged. Medicated per orders without difficulty. Denies SI/HI/AVH and hopes for discharge tomorrow. Pt did refuse his hs lantus stating at his last endocrinology appt he and provider had discussed backing off the lantus. Pt plans to follow up with him upon discharge. Lawrence Marseilles

## 2013-09-05 ENCOUNTER — Ambulatory Visit (HOSPITAL_COMMUNITY): Payer: Self-pay | Admitting: Psychiatry

## 2013-09-05 LAB — GLUCOSE, CAPILLARY
Glucose-Capillary: 201 mg/dL — ABNORMAL HIGH (ref 70–99)
Glucose-Capillary: 213 mg/dL — ABNORMAL HIGH (ref 70–99)
Glucose-Capillary: 140 mg/dL — ABNORMAL HIGH (ref 70–99)
Glucose-Capillary: 178 mg/dL — ABNORMAL HIGH (ref 70–99)

## 2013-09-05 NOTE — Progress Notes (Signed)
Patient ID: [name], male   DOB: 1976-05-19, 37 y.o.   MRN: [id number] [facility] MD Progress Note  09/05/2013 3:08 PM [name]  MRN:  [id number] Subjective:   Patient states "I am getting better. My anxiety is lower. I'm still worried about my legs feeling heavy. But it's been better since they increased my cogentin over the weekend."  Objective:  Patient observed interacting with peers in the dayroom. He is observed walking down the hall with no difficulty. Nursing notes indicate that patient continues to complain of withdrawal tremors from benzos but no visible tremors have been noted by staff. Patient is compliant with medications and is attending the scheduled groups.   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   HENT: Negative.   Eyes: Negative.   Respiratory: Negative.   Cardiovascular: Negative.   Gastrointestinal: Negative.   Genitourinary: Negative.   Musculoskeletal: Negative.   Skin: Negative.   Neurological: Negative.   Endo/Heme/Allergies: Negative.   Psychiatric/Behavioral: Positive for depression and substance abuse. Negative for suicidal ideas, hallucinations and memory loss. The patient is nervous/anxious. The patient does not have insomnia.     Blood pressure 141/85, pulse 105, temperature 98.7 F (37.1 C), temperature source Oral, resp. rate 18, height 6' (1.829 m), weight 100.245 kg (221 lb), SpO2 96.00%.Body mass index is  29.97 kg/(m^2).  General Appearance: Bizarre  Eye Contact::  Good  Speech:  Clear and Coherent  Volume:  Normal  Mood:  Anxious  Affect:  Flat  Thought Process:  Linear  Orientation:  Full (Time, Place, and Person)  Thought Content:  WDL  Suicidal Thoughts:  No  Homicidal Thoughts:  No  Memory:  Immediate;   Good  Judgement:  Fair  Insight:  Fair  Psychomotor Activity:  Stiff  Concentration:  Good  Recall:  Good  Akathisia:  Yes  Handed:  Right  AIMS (if indicated):     Assets:  Communication Skills Desire for Improvement Resilience  Sleep:  Number of Hours: 5.5   Current Medications: Current Facility-Administered Medications  Medication Dose Route Frequency Provider Last Rate Last Dose  . acetaminophen (TYLENOL) tablet 650 mg  650 mg Oral Q6H PRN Earney Navy, NP      . albuterol (PROVENTIL HFA;VENTOLIN HFA) 108 (90 BASE) MCG/ACT inhaler 2 puff  2 puff Inhalation Q6H PRN Earney Navy, NP      . alum & mag hydroxide-simeth (MAALOX/MYLANTA) 200-200-20 MG/5ML suspension 30 mL  30 mL Oral Q4H PRN Earney Navy, NP      . benztropine (COGENTIN) tablet 1 mg  1 mg Oral BID Mojeed Akintayo   1 mg at 09/05/13 0757  . busPIRone (BUSPAR) tablet 15 mg  15 mg Oral TID Mojeed Akintayo   15 mg at 09/05/13 1203  . DULoxetine (CYMBALTA) DR capsule 60 mg  60 mg Oral Daily Earney Navy, NP   60 mg at 09/05/13 0757  . hydrOXYzine (ATARAX/VISTARIL) tablet 50 mg  50 mg Oral Q6H PRN Earney Navy, NP   50 mg at 09/05/13 0030  .  insulin aspart (novoLOG) injection 0-15 Units  0-15 Units Subcutaneous TID WC Fransisca Kaufmann, NP   2 Units at 09/05/13 1202  . insulin aspart (novoLOG) injection 5 Units  5 Units Subcutaneous TID WC Earney Navy, NP   5 Units at 09/05/13 1203  . insulin glargine (LANTUS) injection 60 Units  60 Units Subcutaneous QHS Jorje Guild, PA-C      . loperamide (IMODIUM) capsule 4 mg  4 mg Oral PRN Earney Navy, NP      . loratadine (CLARITIN) tablet  10 mg  10 mg Oral Daily Earney Navy, NP   10 mg at 09/05/13 0757  . magnesium hydroxide (MILK OF MAGNESIA) suspension 30 mL  30 mL Oral Daily PRN Earney Navy, NP      . metFORMIN (GLUCOPHAGE) tablet 1,000 mg  1,000 mg Oral BID WC Earney Navy, NP   1,000 mg at 09/05/13 0757  . methocarbamol (ROBAXIN) tablet 750 mg  750 mg Oral TID Earney Navy, NP   750 mg at 09/05/13 1203  . nabumetone (RELAFEN) tablet 750 mg  750 mg Oral BID WC Catarina Hartshorn, MD   750 mg at 09/05/13 0757  . niacin CR capsule 1,000 mg  1,000 mg Oral Daily Mojeed Akintayo   1,000 mg at 09/05/13 0756  . nicotine (NICODERM CQ - dosed in mg/24 hours) patch 21 mg  21 mg Transdermal Daily Earney Navy, NP   21 mg at 09/05/13 0756  . omega-3 acid ethyl esters (LOVAZA) capsule 2 g  2 g Oral BID Flint Melter, MD   2 g at 09/05/13 1203  . ondansetron (ZOFRAN) tablet 4 mg  4 mg Oral Q8H PRN Earney Navy, NP   4 mg at 09/04/13 1831  . risperiDONE (RISPERDAL M-TABS) disintegrating tablet 2 mg  2 mg Oral QHS Kerry Hough, PA-C   2 mg at 09/04/13 2133  . simvastatin (ZOCOR) tablet 20 mg  20 mg Oral q1800 Earney Navy, NP   20 mg at 09/04/13 1707  . Tapentadol HCl TB12 250 mg  250 mg Oral Q12H Mojeed Akintayo   250 mg at 09/05/13 0803  . traZODone (DESYREL) tablet 150 mg  150 mg Oral QHS Mojeed Akintayo   150 mg at 09/04/13 2133    Lab Results:  Results for orders placed during the hospital encounter of 08/29/13 (from the past 48 hour(s))  GLUCOSE, CAPILLARY     Status: Abnormal   Collection Time    09/03/13  5:10 PM      Result Value Range   Glucose-Capillary 233 (*) 70 - 99 mg/dL   Comment 1 Documented in Chart     Comment 2 Notify RN    GLUCOSE, CAPILLARY     Status: None   Collection Time    09/03/13  9:11 PM      Result Value Range   Glucose-Capillary 85  70 - 99 mg/dL  GLUCOSE, CAPILLARY     Status: Abnormal   Collection Time    09/04/13  6:22 AM      Result Value Range    Glucose-Capillary 119 (*) 70 - 99 mg/dL   Comment 1 Notify RN     Comment 2 Documented in Chart    GLUCOSE, CAPILLARY     Status: Abnormal   Collection Time    09/04/13 12:06 PM      Result Value Range   Glucose-Capillary 169 (*) 70 - 99 mg/dL  Comment 1 Notify RN    GLUCOSE, CAPILLARY     Status: Abnormal   Collection Time    09/04/13  5:05 PM      Result Value Range   Glucose-Capillary 183 (*) 70 - 99 mg/dL   Comment 1 Documented in Chart     Comment 2 Notify RN    GLUCOSE, CAPILLARY     Status: Abnormal   Collection Time    09/04/13  9:24 PM      Result Value Range   Glucose-Capillary 201 (*) 70 - 99 mg/dL  GLUCOSE, CAPILLARY     Status: Abnormal   Collection Time    09/05/13  6:39 AM      Result Value Range   Glucose-Capillary 213 (*) 70 - 99 mg/dL  GLUCOSE, CAPILLARY     Status: Abnormal   Collection Time    09/05/13 11:58 AM      Result Value Range   Glucose-Capillary 140 (*) 70 - 99 mg/dL    Physical Findings: AIMS: Facial and Oral Movements Muscles of Facial Expression: None, normal Lips and Perioral Area: None, normal Jaw: None, normal Tongue: None, normal,Extremity Movements Upper (arms, wrists, hands, fingers): None, normal Lower (legs, knees, ankles, toes): None, normal, Trunk Movements Neck, shoulders, hips: None, normal, Overall Severity Severity of abnormal movements (highest score from questions above): None, normal Incapacitation due to abnormal movements: None, normal Patient's awareness of abnormal movements (rate only patient's report): No Awareness, Dental Status Current problems with teeth and/or dentures?: No Does patient usually wear dentures?: No  CIWA:  CIWA-Ar Total: 0 COWS:  COWS Total Score: 2  Treatment Plan Summary: Daily contact with patient to assess and evaluate symptoms and progress in treatment Medication management  Plan: 1. Continue crisis management and stabilization.  2. Medication management to reduce current symptoms  to base line and improve the patient's overall level of functioning  3. Treat health problems as indicated.  4. Develop treatment plan to decrease risk of relapse upon discharge and the need for readmission.  5. Psycho-social education regarding relapse prevention and self care.  6. Health care follow up as needed for medical problems.  7. Restart home medications where appropriate.  8. Will continue Trazodone 150mg  po Qhs for insomnia  9. Continue Cogentin to 1 mg bid 10. Continue Risperidone 2mg  po Qhs for paranoia. Continue Cymbalta 60 mg for depression. Continue Buspar 15 mg TID for symptoms of anxiety. Patient educated regarding why benzodiazepines are contraindicated in his treatment.   Medical Decision Making Problem Points:  Established problem, stable/improving (1) and Review of psycho-social stressors (1) Data Points:  Review or order clinical lab tests (1) Review of medication regiment & side effects (2) Review of new medications or change in dosage (2)  I certify that inpatient services furnished can reasonably be expected to improve the patient's condition.   Zenola Dezarn NP-C 09/05/2013, 3:08 PM

## 2013-09-05 NOTE — [facility] Group Notes (Signed)
[facility] LCSW Group Therapy  09/05/2013 1:15 pm  Type of Therapy: Process Group Therapy  Participation Level:  Active  Participation Quality:  Appropriate  Affect:  Flat  Cognitive:  Oriented  Insight:  Improving  Engagement in Group:  Limited  Engagement in Therapy:  Limited  Modes of Intervention:  Activity, Clarification, Education, Problem-solving and Support  Summary of Progress/Problems: Today's group addressed the issue of overcoming obstacles.  Patients were asked to identify their biggest obstacle post d/c that stands in the way of their on-going success, and then problem solve as to how to manage this.  Trey Paula stated his main goal is to not depend on meds to help with his anxiety, but admitted it is difficult to do so.  Gave examples of how he used both breathing exercise abnd walking as ways of coping with increased anxiety, and was given positive feedback by other group members about this.  Daryel Gerald B 09/05/2013   4:11 PM

## 2013-09-05 NOTE — [facility] Group Notes (Signed)
[facility] LCSW Aftercare Discharge Planning Group Note   09/05/2013 1:36 PM  Participation Quality:  Engaged  Mood/Affect:  Flat  Depression Rating:  5  Anxiety Rating:  5  Thoughts of Suicide:  No Will you contract for safety?   NA  Current AVH:  No  Plan for Discharge/Comments:  Trey Paula states he had a good weekend.  He had no visitors, but says that was fine.  He needed time on his own to practice handling his anxiety without distraction.  C/o muscle stiffness from medication, but said it is slowly getting better with addition of cogentin.  Hopes to be d/ced by Wed.  Transportation Means: family  Supports: family  [place], Baldo Daub

## 2013-09-05 NOTE — Progress Notes (Signed)
Genia Plants Azriella Mattia Technician Signed Psychiatry Progress Notes Service date: 09/05/2013 12:02 PM   Date: 09/05/2013  Time: 12:01 PM  Group Topic/Focus:  Self Care: The focus of this group is to help patients understand the importance of self-care in order to improve or restore emotional, physical, spiritual, interpersonal, and financial health.  Participation Level: Active  Participation Quality: Appropriate, Sharing and Supportive  Affect: Appropriate  Cognitive: Appropriate and Oriented  Insight: Appropriate  Engagement in Group: Engaged and Supportive  Modes of Intervention: Discussion, Education, Problem-solving and Support  Additional Comments: Pt attended group.  Isla Pence M  09/05/2013, 12:01PM

## 2013-09-05 NOTE — Progress Notes (Signed)
Recreation Therapy Notes  Date: 10.27.2014 Time: 9:30am Location: [address] Dayroom  Group Topic: Emotional Recognition, Communication  Goal Area(s) Addresses:  Patient will successfully display emotions during group session.  Patient will identify importance of displaying emotions. Patient will identify impact of emotions on communication.   Behavioral Response: Appropriate, Observation  Intervention: Game  Activity:  Emotional Charades. Patients were asked to select an emotion from provided container, using only facial expressions and body gestures patients were asked to act out selected emotion. Patients were provided a list of emotions to assist them with this process.   Education: Engineer, civil (consulting), Communication, Discharge Planning.   Education Outcome: Acknowledges understanding   Clinical Observations/Feedback: Patient attended group session, however patient stated he did not want to participate in activity. Patient stated he was feeling "really weighed down" due to medication changes. Patient actively observed peer interactions, as well as actively listened to group discussion, but made no contributions.   Marykay Lex Lekeya Rollings, LRT/CTRS  Evette Diclemente L 09/05/2013 1:27 PM

## 2013-09-05 NOTE — Progress Notes (Signed)
D: Pt denies SI/HI/AVH. Pt reports withdrawal symptoms tremors and agitation. No visible tremors noted by Clinical research associate. Pt reports depression and hopelessness 2/10. Pt forwards little information and has minimal interaction. Pt compliant with taking meds and attending groups. A: Medications administered as ordered per MD. Verbal support given. Pt encouraged to attend groups. 15 minute checks performed for safety. R: Pt receptive to treatment.

## 2013-09-05 NOTE — Progress Notes (Signed)
Pt doing well this evening. States stiffness and heavy sensation in legs is improving since cogentin was increased. No complaints and is not med seeking. No withdrawal or complaints regarding cessation of klonopin. Chronic pain continues to be well controlled on patient's home pain med. CBG this evening is 145. Medicated per orders though pt did refuse lantus again (see previous DAR notes for further info). Fall precautions reviewed and in place. Denies SI/HI/AVH and remains safe. Lawrence [place]

## 2013-09-06 LAB — GLUCOSE, CAPILLARY
Glucose-Capillary: 171 mg/dL — ABNORMAL HIGH (ref 70–99)
Glucose-Capillary: 254 mg/dL — ABNORMAL HIGH (ref 70–99)
Glucose-Capillary: 127 mg/dL — ABNORMAL HIGH (ref 70–99)

## 2013-09-06 NOTE — Progress Notes (Signed)
D: Pt denies SI/HI/AVH. Pt denies withdrawal s/s. No complaints verbalized by pt. Pt stated that he no longer have muscle stiffness of his upper and lower extremities. Pt presents with anxious mood. Pt requesting to discharge today, stating that he is ready to go back to his wife and kids. Pt compliant with taking meds and attending groups. A: Medications administered as ordered per MD.  Verbal support given. Pt encouraged to attend groups. 15 minute checks performed for safety. R: Pt receptive to treatment.

## 2013-09-06 NOTE — Progress Notes (Signed)
Seen and agreed. Mckinnley Smithey, MD 

## 2013-09-06 NOTE — Tx Team (Signed)
  Interdisciplinary Treatment Plan Update   Date Reviewed:  09/06/2013  Time Reviewed:  10:47 AM  Progress in Treatment:   Attending groups: Yes Participating in groups: Yes Taking medication as prescribed: Yes  Tolerating medication: Yes Family/Significant other contact made: Yes  Patient understands diagnosis: Yes  Discussing patient identified problems/goals with staff: Yes Medical problems stabilized or resolved: Yes Denies suicidal/homicidal ideation: Yes  In tx team Patient has not harmed self or others: Yes  For review of initial/current patient goals, please see plan of care.  Estimated Length of Stay:  Likely d/c tomorrow  Reason for Continuation of Hospitalization: Anxiety Medication stabilization  New Problems/Goals identified:  N/A  Discharge Plan or Barriers:   return home, follow up outpt  Additional Comments:  Trey Paula states he has been learning to deal with his anxiety in other ways than depending on a pill.  He cites coping skills of breathing exercises and walking.  He was experiencing physical side affects from the anti-psychotic, but states that is diminishing with the help of the cogentin.  Attendees:  Signature: Thedore Mins, MD 09/06/2013 10:47 AM   Signature: Richelle Ito, LCSW 09/06/2013 10:47 AM  Signature: Fransisca Kaufmann, NP 09/06/2013 10:47 AM  Signature: Joslyn Devon, RN 09/06/2013 10:47 AM  Signature: Liborio Nixon, RN 09/06/2013 10:47 AM  Signature:  09/06/2013 10:47 AM  Signature:   09/06/2013 10:47 AM  Signature:    Signature:    Signature:    Signature:    Signature:    Signature:      Scribe for Treatment Team:   Richelle Ito, LCSW  09/06/2013 10:47 AM

## 2013-09-06 NOTE — Progress Notes (Signed)
D: Pt denies SI/HI/AV. Pt is pleasant and cooperative. Pt states he plans to go to therapy and keep appts with doctors when he gets out of here.   A: Pt was offered support and encouragement. Pt was given scheduled medications. Pt was encourage to attend groups. Q 15 minute checks were done for safety.   R:Pt attends groups and interacts well with peers and staff. Pt is taking medication. Pt has no complaints at this time .Pt receptive to treatment and safety maintained on unit.

## 2013-09-06 NOTE — Progress Notes (Signed)
Seen and agreed. Bryony Kaman, MD 

## 2013-09-06 NOTE — Progress Notes (Signed)
Patient ID: [name], male   DOB: [birth year], 37 y.o.   MRN: [id number] [facility] MD Progress Note  09/06/2013 10:50 AM KAYDAN WONG  MRN:  [id number] Subjective: "I am ready for discharge, I am mentally and physically strong." Objective: Patient reports decreased anxiety, depression and delusional symptoms. He says that he is more motivated and has improved energy level. He denies mood swings, irritability, suicidal or homicidal thoughts. Patient is compliant with his medications and has not endorsed any adverse reactions. 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   Eyes: Negative.   Respiratory: Negative.   Cardiovascular: Negative.   Gastrointestinal: Negative.   Genitourinary: Negative.   Musculoskeletal: Positive for joint pain.  Skin: Negative.   Endo/Heme/Allergies: Negative.   Psychiatric/Behavioral: The patient is nervous/anxious.     Blood pressure 128/82, pulse 124, temperature 97.8 F (36.6 C), temperature source Oral, resp. rate 20, height 6' (1.829 m), weight 100.245 kg (221 lb), SpO2 96.00%.Body mass index is 29.97 kg/(m^2).  General Appearance: Fairly Groomed  Patent attorney::  Minimal  Speech:  Slow   Volume:  normal  Mood:  Anxious  Affect:  brighter  Thought Process: logical and coherent  Orientation:  Full (Time, Place, and Person)  Thought Content: reality based  Suicidal Thoughts:   denies  Homicidal Thoughts:  No  Memory:  Immediate;   Fair Recent;   Fair Remote;   Fair  Judgement: marginal  Insight:  marginal  Psychomotor Activity:  Decreased  Concentration:  Fair  Recall:  Fair  Akathisia:  No  Handed:  Right  AIMS (if indicated):     Assets:  Financial Resources/Insurance Social Support  Sleep:  Number of Hours: 6   Current Medications: Current Facility-Administered Medications  Medication Dose Route Frequency Provider Last Rate Last Dose  . acetaminophen (TYLENOL) tablet 650 mg  650 mg Oral Q6H PRN Earney Navy, NP      . albuterol (PROVENTIL HFA;VENTOLIN HFA) 108 (90 BASE) MCG/ACT inhaler 2 puff  2 puff Inhalation Q6H PRN Earney Navy, NP      . alum & mag hydroxide-simeth (MAALOX/MYLANTA) 200-200-20 MG/5ML suspension 30 mL  30 mL Oral Q4H PRN Earney Navy, NP   30 mL at 09/05/13 1709  . benztropine (COGENTIN) tablet 1 mg  1 mg Oral BID Solan Vosler   1 mg at 09/06/13 0741  . busPIRone (BUSPAR) tablet 15 mg  15 mg Oral TID Yaelis Scharfenberg   15 mg at 09/06/13 0741  . DULoxetine (CYMBALTA) DR capsule 60 mg  60 mg Oral Daily Earney Navy, NP   60 mg at 09/06/13 0741  . hydrOXYzine (ATARAX/VISTARIL) tablet 50 mg  50 mg Oral Q6H PRN Earney Navy, NP   50 mg at 09/05/13 0030  . insulin aspart (novoLOG) injection 0-15 Units  0-15 Units Subcutaneous TID WC Fransisca Kaufmann, NP   3 Units at 09/06/13 [id number]  . insulin aspart (novoLOG) injection 5 Units  5  Units Subcutaneous TID WC Earney Navy, NP   5 Units at 09/06/13 0636  . insulin glargine (LANTUS) injection 60 Units  60 Units Subcutaneous QHS Jorje Guild, PA-C      . loperamide (IMODIUM) capsule 4 mg  4 mg Oral PRN Earney Navy, NP      . loratadine (CLARITIN) tablet 10 mg  10 mg Oral Daily Earney Navy, NP   10 mg at 09/06/13 0742  . magnesium hydroxide (MILK OF MAGNESIA) suspension 30 mL  30 mL Oral Daily PRN Earney Navy, NP      . metFORMIN (GLUCOPHAGE) tablet  1,000 mg  1,000 mg Oral BID WC Earney Navy, NP   1,000 mg at 09/06/13 0741  . methocarbamol (ROBAXIN) tablet 750 mg  750 mg Oral TID Earney Navy, NP   750 mg at 09/06/13 0741  . nabumetone (RELAFEN) tablet 750 mg  750 mg Oral BID WC Catarina Hartshorn, MD   750 mg at 09/06/13 0741  . niacin CR capsule 1,000 mg  1,000 mg Oral Daily Aerika Groll   1,000 mg at 09/06/13 0741  . nicotine (NICODERM CQ - dosed in mg/24 hours) patch 21 mg  21 mg Transdermal Daily Earney Navy, NP   21 mg at 09/06/13 0744  . omega-3 acid ethyl esters (LOVAZA) capsule 2 g  2 g Oral BID Flint Melter, MD   2 g at 09/05/13 2158  . ondansetron (ZOFRAN) tablet 4 mg  4 mg Oral Q8H PRN Earney Navy, NP   4 mg at 09/04/13 1831  . risperiDONE (RISPERDAL M-TABS) disintegrating tablet 2 mg  2 mg Oral QHS Kerry Hough, PA-C   2 mg at 09/05/13 2159  . simvastatin (ZOCOR) tablet 20 mg  20 mg Oral q1800 Earney Navy, NP   20 mg at 09/05/13 1656  . Tapentadol HCl TB12 250 mg  250 mg Oral Q12H Welma Mccombs   250 mg at 09/06/13 0744  . traZODone (DESYREL) tablet 150 mg  150 mg Oral QHS Emmamae Mcnamara   150 mg at 09/05/13 2159    Lab Results:  Results for orders placed during the hospital encounter of 08/29/13 (from the past 48 hour(s))  GLUCOSE, CAPILLARY     Status: Abnormal   Collection Time    09/04/13 12:06 PM      Result Value Range   Glucose-Capillary 169 (*) 70 - 99 mg/dL   Comment 1 Notify RN    GLUCOSE, CAPILLARY     Status: Abnormal   Collection Time    09/04/13  5:05 PM      Result Value Range   Glucose-Capillary 183 (*) 70 - 99 mg/dL   Comment 1 Documented in Chart     Comment 2 Notify RN    GLUCOSE, CAPILLARY     Status: Abnormal   Collection Time    09/04/13  9:24 PM      Result Value Range   Glucose-Capillary 201 (*) 70 - 99 mg/dL  GLUCOSE, CAPILLARY     Status: Abnormal   Collection Time    09/05/13  6:39 AM      Result Value Range   Glucose-Capillary 213 (*) 70 - 99 mg/dL   GLUCOSE, CAPILLARY     Status: Abnormal   Collection Time    09/05/13 11:58 AM      Result Value Range   Glucose-Capillary 140 (*) 70 - 99 mg/dL  GLUCOSE, CAPILLARY  Status: Abnormal   Collection Time    09/05/13  4:55 PM      Result Value Range   Glucose-Capillary 178 (*) 70 - 99 mg/dL   Comment 1 Documented in Chart     Comment 2 Notify RN    GLUCOSE, CAPILLARY     Status: Abnormal   Collection Time    09/05/13  9:10 PM      Result Value Range   Glucose-Capillary 145 (*) 70 - 99 mg/dL   Comment 1 Notify RN    GLUCOSE, CAPILLARY     Status: Abnormal   Collection Time    09/06/13  6:23 AM      Result Value Range   Glucose-Capillary 171 (*) 70 - 99 mg/dL   Comment 1 Notify RN      Physical Findings: AIMS: Facial and Oral Movements Muscles of Facial Expression: None, normal Lips and Perioral Area: None, normal Jaw: None, normal Tongue: None, normal,Extremity Movements Upper (arms, wrists, hands, fingers): None, normal Lower (legs, knees, ankles, toes): None, normal, Trunk Movements Neck, shoulders, hips: None, normal, Overall Severity Severity of abnormal movements (highest score from questions above): None, normal Incapacitation due to abnormal movements: None, normal Patient's awareness of abnormal movements (rate only patient's report): No Awareness, Dental Status Current problems with teeth and/or dentures?: No Does patient usually wear dentures?: No  CIWA:  CIWA-Ar Total: 0 COWS:  COWS Total Score: 2  Treatment Plan Summary: Daily contact with patient to assess and evaluate symptoms and progress in treatment Medication management  Plan:1. Admit for crisis management and stabilization. 2. Medication management to reduce current symptoms to base line and improve the     patient's overall level of functioning 3. Treat health problems as indicated. 4. Develop treatment plan to decrease risk of relapse upon discharge and the need for     readmission. 5.  Psycho-social education regarding relapse prevention and self care. 6. Health care follow up as needed for medical problems. 7. Restart home medications where appropriate. 8. Will continue Trazodone 150mg  po Qhs for insomnia 9. Continue Risperidone  2mg  po Qhs for paranoia   Medical Decision Making Problem Points:  Established problem, improving (1), Review of last therapy session (1) and Review of psycho-social stressors (1) Data Points:  Order Aims Assessment (2) Review of medication regiment & side effects (2) Review of new medications or change in dosage (2)  I certify that inpatient services furnished can reasonably be expected to improve the patient's condition.   Thedore Mins, MD 09/06/2013, 10:50 AM

## 2013-09-06 NOTE — [facility] Group Notes (Signed)
[facility] LCSW Group Therapy  09/06/2013 , 12:38 PM   Type of Therapy:  Group Therapy  Participation Level:  Active  Participation Quality:  Attentive  Affect:  Appropriate  Cognitive:  Alert  Insight:  Improving  Engagement in Therapy:  Engaged  Modes of Intervention:  Discussion, Exploration and Socialization  Summary of Progress/Problems: Today's group focused on the term Diagnosis.  Participants were asked to define the term, and then pronounce whether it is a negative, positive or neutral term.  [name] talked about his diagnosis of bipolar disorder and anxiety, and how he is comfortable with that.  Went on to talk about how he has felt stigmatized by family members due to has diagnosis.  Daryel Gerald B 09/06/2013 , 12:38 PM

## 2013-09-06 NOTE — [facility] Group Notes (Signed)
Adult Psychoeducational Group Note  Date:  09/06/2013 Time:  9:30 PM  Group Topic/Focus:  Wrap-Up Group:   The focus of this group is to help patients review their daily goal of treatment and discuss progress on daily workbooks.  Participation Level:  Minimal  Participation Quality:  Appropriate  Affect:  Appropriate  Cognitive:  Appropriate  Insight: Appropriate  Engagement in Group:  Engaged  Modes of Intervention:  Discussion  Additional Comments:  [name] stated his day was pretty good.  He went to groups and is getting discharged tomorrow and he wants to get a good nights sleep.  His goal was to try not to use anxiety medication and learn other ways to deal with his anxiety.  He stated that he met his goal trying things like walking and talking to others.  Two positives about himself were he's feeling better and ready to goal home.  [name] A 09/06/2013, 9:30 PM

## 2013-09-07 LAB — GLUCOSE, CAPILLARY
Glucose-Capillary: 215 mg/dL — ABNORMAL HIGH (ref 70–99)
Glucose-Capillary: 220 mg/dL — ABNORMAL HIGH (ref 70–99)

## 2013-09-07 MED ORDER — CETIRIZINE HCL 10 MG PO TABS: 10.0000 mg | ORAL_TABLET | Freq: Two times a day (BID) | ORAL | Status: DC

## 2013-09-07 MED ORDER — NABUMETONE 750 MG PO TABS: 750.0000 mg | ORAL_TABLET | Freq: Two times a day (BID) | ORAL | Status: DC

## 2013-09-07 MED ORDER — INSULIN NPH (HUMAN) (ISOPHANE) 100 UNIT/ML ~~LOC~~ SUSP: 20.0000 [IU] | Freq: Every day | SUBCUTANEOUS | Status: DC

## 2013-09-07 MED ORDER — DULOXETINE HCL 60 MG PO CPEP: 60.0000 mg | ORAL_CAPSULE | Freq: Every day | ORAL | Status: DC

## 2013-09-07 MED ORDER — RISPERIDONE 2 MG PO TBDP: 2.0000 mg | ORAL_TABLET | Freq: Every day | ORAL | Status: DC

## 2013-09-07 MED ORDER — BUSPIRONE HCL 15 MG PO TABS: 15.0000 mg | ORAL_TABLET | Freq: Three times a day (TID) | ORAL | Status: DC

## 2013-09-07 MED ORDER — TRAZODONE HCL 150 MG PO TABS: 150.0000 mg | ORAL_TABLET | Freq: Every day | ORAL | Status: DC

## 2013-09-07 MED ORDER — BENZTROPINE MESYLATE 1 MG PO TABS: 1.0000 mg | ORAL_TABLET | Freq: Two times a day (BID) | ORAL | Status: DC

## 2013-09-07 MED ORDER — INSULIN GLARGINE 100 UNIT/ML SOLOSTAR PEN: 70.0000 [IU] | PEN_INJECTOR | Freq: Every day | SUBCUTANEOUS | Status: DC

## 2013-09-07 MED ORDER — HYDROXYZINE HCL 50 MG PO TABS: 50.0000 mg | ORAL_TABLET | Freq: Four times a day (QID) | ORAL | Status: DC | PRN

## 2013-09-07 NOTE — Progress Notes (Signed)
Recreation Therapy Notes  Date: 10.29.2014 Time: 9:30am Location: [address] Dayroom  Group Topic: Leisure Education  Goal Area(s) Addresses:  Patient will verbalize activity of interest by end of group session. Patient will verbalize the ability to use positive leisure/recreation as a coping mechanism.  Behavioral Response: Appropriate   Intervention: Game  Activity: Leisure ABC's. LRT wrote the alphabet on the white board in the dayroom. Patients were asked to identify a leisure activity to correspond with each letter of the alphabet.  Education:  Leisure Education, Building control surveyor, Coping Skills  Education Outcome: Needs additional education  Clinical Observations/Feedback: Patient assumed role of observer during session, making no statements or contributions to activity or group discussion.   Marykay Lex Myrah Strawderman, LRT/CTRS  Demitris Pokorny L 09/07/2013 1:07 PM

## 2013-09-07 NOTE — Progress Notes (Signed)
[facility] Adult Case Management Discharge Plan :  Will you be returning to the same living situation after discharge: Yes,  home At discharge, do you have transportation home?:Yes,  spouse Do you have the ability to pay for your medications:Yes,  insurance  Release of information consent forms completed and in the chart;  Patient's signature needed at discharge.  Patient to Follow up at: Follow-up Information   Follow up with [facility] On 09/09/2013. (Friday at 8:30 with Dr Lolly Mustache)    Contact information:   [address]  [facility]  [phone]      Follow up with [facility] On 09/20/2013. (Tuesday at 10:30 with Orvan July)       Patient denies SI/HI:   Yes,  yes    Safety Planning and Suicide Prevention discussed:  Yes,  yes  Daryel Gerald B 09/07/2013, 12:00 PM

## 2013-09-07 NOTE — [facility] Suicide Risk Assessment (Signed)
Suicide Risk Assessment  Discharge Assessment     Demographic Factors:  Male, Caucasian, Low socioeconomic status and Unemployed  Mental Status Per Nursing Assessment::   On Admission:     Current Mental Status by Physician: patient denies suicide ideation, intent or plan  Loss Factors: Financial problems/change in socioeconomic status  Historical Factors: Impulsivity  Risk Reduction Factors:   Sense of responsibility to family, Living with another person, especially a relative and Positive social support  Continued Clinical Symptoms:  Resolving depression and psychosis  Cognitive Features That Contribute To Risk:  Closed-mindedness Polarized thinking    Suicide Risk:  Minimal: No identifiable suicidal ideation.  Patients presenting with no risk factors but with morbid ruminations; may be classified as minimal risk based on the severity of the depressive symptoms  Discharge Diagnoses:   AXIS I:  Major depressive disorder, recurrent episode, severe, specified as with psychotic behavior                AXIS II:  Deferred AXIS III:   Past Medical History  Diagnosis Date  . Allergy   . Asthma   . Depression   . GERD (gastroesophageal reflux disease)   . Hypertension   . Hyperlipidemia   . Chronic pain disorder     Sees [facility]  . Urinary incontinence     detrusor instability  . Hypogonadism   . Diabetes mellitus     Type II   AXIS IV:  economic problems, other psychosocial or environmental problems and problems related to social environment AXIS V:  61-70 mild symptoms  Plan Of Care/Follow-up recommendations:  Activity:  as tolerated Diet:  healthy Tests:  routine Other:  patient to keep her follow up appointment  Is patient on multiple antipsychotic therapies at discharge:  No   Has Patient had three or more failed trials of antipsychotic monotherapy by history:  No  Recommended Plan for Multiple Antipsychotic Therapies: NA  Vashaun Osmon,  Dempsey Knotek,MD 09/07/2013, 8:51 AM

## 2013-09-07 NOTE — Tx Team (Signed)
  Interdisciplinary Treatment Plan Update   Date Reviewed:  09/07/2013  Time Reviewed:  11:58 AM  Progress in Treatment:   Attending groups: Yes Participating in groups: Yes Taking medication as prescribed: Yes  Tolerating medication: Yes Family/Significant other contact made: Yes  Patient understands diagnosis: Yes  Discussing patient identified problems/goals with staff: Yes Medical problems stabilized or resolved: Yes Denies suicidal/homicidal ideation: Yes Patient has not harmed self or others: Yes  For review of initial/current patient goals, please see plan of care.  Estimated Length of Stay:  D/C today  Reason for Continuation of Hospitalization:   New Problems/Goals identified:  N/A  Discharge Plan or Barriers:   return home, follow up [facility] out[facility]  Additional Comments:  Attendees:  Signature: Thedore Mins, MD 09/07/2013 11:58 AM   Signature: Richelle Ito, LCSW 09/07/2013 11:58 AM  Signature: Fransisca Kaufmann, NP 09/07/2013 11:58 AM  Signature: Joslyn Devon, RN 09/07/2013 11:58 AM  Signature: Liborio Nixon, RN 09/07/2013 11:58 AM  Signature:  09/07/2013 11:58 AM  Signature:   09/07/2013 11:58 AM  Signature:    Signature:    Signature:    Signature:    Signature:    Signature:      Scribe for Treatment Team:   Richelle Ito, LCSW  09/07/2013 11:58 AM

## 2013-09-07 NOTE — Discharge Summary (Signed)
Physician Discharge Summary Note  Patient:  [name] is an 37 y.o., male MRN:  [id number] DOB:  September 19, 1976 Patient phone:  [phone] (home)  Patient address:   [address] [place] [address],   Date of Admission:  08/29/2013 Date of Discharge: 09/07/13  Reason for Admission:  Suicidal Ideation and anxiety  Discharge Diagnoses: Principal Problem:   Major depressive disorder, recurrent episode, severe, specified as with psychotic behavior Active Problems:   Generalized anxiety disorder  Review of Systems  Constitutional: Negative.   HENT: Negative.   Eyes: Negative.   Respiratory: Negative.   Cardiovascular: Negative.   Gastrointestinal: Negative.   Genitourinary: Negative.   Musculoskeletal: Negative.   Skin: Negative.   Neurological: Negative.   Endo/Heme/Allergies: Negative.   Psychiatric/Behavioral: Negative for depression, suicidal ideas, hallucinations, memory loss and substance abuse. The patient is nervous/anxious. The patient does not have insomnia.     DSM5: AXIS I: Major depressive disorder, recurrent episode, severe, specified as with psychotic behavior  AXIS II: Deferred  AXIS III:  Past Medical History   Diagnosis  Date   .  Allergy    .  Asthma    .  Depression    .  GERD (gastroesophageal reflux disease)    .  Hypertension    .  Hyperlipidemia    .  Chronic pain disorder      Sees [facility]   .  Urinary incontinence      detrusor instability   .  Hypogonadism    .  Diabetes mellitus      Type II    AXIS IV: economic problems, other psychosocial or environmental problems and problems related to social environment  AXIS V: 61-70 mild symptoms  Level of Care:  OP  Hospital Course:   This is a 37 year old male who was admitted voluntarily for suicidal thoughts and paranoia. Patient was recently a patient at [facility] after overdosing on klonopin. The patient is upset about being taken off his Klonopin as he was  on it for many years. Patient states "I was given librium for five days for detox. But I think I am having a delayed withdrawal. I think I need more benzos. My main stressor is financial. I'm on Disability and my wife is trying to get on it as well. My wife brought me to the ED because I was thinking of shooting myself. I have been feeling very paranoid lately feeling like my Doctor is conspiring with the government against me. I'm feeling better than yesterday but still rough." Patient is tearful during the assessment and appeared let down after finding out that he would not be prescribed any benzodiazepines. Patient reports recently having episodes of nausea and vomiting which he felt was related to Depakote. The patient was sent to the hospital prior to coming to [facility] for low sodium which caused lethargy. This issues has been resolved with current sodium level of 138.          [name] was admitted to the adult unit. He was evaluated and his symptoms were identified. Medication management was discussed and initiated. He was oriented to the unit and encouraged to participate in unit programming. Medical problems were identified and treated appropriately. Home medication was restarted as needed. Patient expressed increased anxiety since being taken off klonopin. This medication was not restarted as patient had overdosed on it earlier this year. His medications were managed by the MD. Patient was started  on Buspar 15 mg TID to address his symptoms. The patient was compliant with his medications. He expressed less paranoia after his Risperdal was changed to 2 mg at hs.         The patient was evaluated each day by a clinical provider to ascertain the patient's response to treatment.  Improvement was noted by the patient's report of decreasing symptoms, improved sleep and appetite, affect, medication tolerance, behavior, and participation in unit programming.  [name] was asked each day to complete a self  inventory noting mood, mental status, pain, new symptoms, anxiety and concerns.         He responded well to medication and being in a therapeutic and supportive environment. Positive and appropriate behavior was noted and the patient was motivated for recovery.  The patient worked closely with the treatment team and case manager to develop a discharge plan with appropriate goals. Coping skills, problem solving as well as relaxation therapies were also part of the unit programming.         By the day of discharge [name] was in much improved condition than upon admission.  Symptoms were reported as significantly decreased or resolved completely.  The patient denied SI/HI and voiced no AVH. He was motivated to continue taking medication with a goal of continued improvement in mental health.          [name] was discharged home with a plan to follow up as noted below.  Consults:  None  Significant Diagnostic Studies:  labs: Routine admission labs reviewed.   Discharge Vitals:   Blood pressure 120/65, pulse 121, temperature 97.9 F (36.6 C), temperature source Oral, resp. rate 18, height 6' (1.829 m), weight 100.245 kg (221 lb), SpO2 96.00%. Body mass index is 29.97 kg/(m^2). Lab Results:   Results for orders placed during the hospital encounter of 08/29/13 (from the past 72 hour(s))  GLUCOSE, CAPILLARY     Status: Abnormal   Collection Time    09/04/13 12:06 PM      Result Value Range   Glucose-Capillary 169 (*) 70 - 99 mg/dL   Comment 1 Notify RN    GLUCOSE, CAPILLARY     Status: Abnormal   Collection Time    09/04/13  5:05 PM      Result Value Range   Glucose-Capillary 183 (*) 70 - 99 mg/dL   Comment 1 Documented in Chart     Comment 2 Notify RN    GLUCOSE, CAPILLARY     Status: Abnormal   Collection Time    09/04/13  9:24 PM      Result Value Range   Glucose-Capillary 201 (*) 70 - 99 mg/dL  GLUCOSE, CAPILLARY     Status: Abnormal   Collection Time    09/05/13  6:39 AM       Result Value Range   Glucose-Capillary 213 (*) 70 - 99 mg/dL  GLUCOSE, CAPILLARY     Status: Abnormal   Collection Time    09/05/13 11:58 AM      Result Value Range   Glucose-Capillary 140 (*) 70 - 99 mg/dL  GLUCOSE, CAPILLARY     Status: Abnormal   Collection Time    09/05/13  4:55 PM      Result Value Range   Glucose-Capillary 178 (*) 70 - 99 mg/dL   Comment 1 Documented in Chart     Comment 2 Notify RN    GLUCOSE, CAPILLARY     Status: Abnormal   Collection Time  09/05/13  9:10 PM      Result Value Range   Glucose-Capillary 145 (*) 70 - 99 mg/dL   Comment 1 Notify RN    GLUCOSE, CAPILLARY     Status: Abnormal   Collection Time    09/06/13  6:23 AM      Result Value Range   Glucose-Capillary 171 (*) 70 - 99 mg/dL   Comment 1 Notify RN    GLUCOSE, CAPILLARY     Status: Abnormal   Collection Time    09/06/13 11:49 AM      Result Value Range   Glucose-Capillary 213 (*) 70 - 99 mg/dL  GLUCOSE, CAPILLARY     Status: Abnormal   Collection Time    09/06/13  4:59 PM      Result Value Range   Glucose-Capillary 254 (*) 70 - 99 mg/dL   Comment 1 Documented in Chart     Comment 2 Notify RN    GLUCOSE, CAPILLARY     Status: Abnormal   Collection Time    09/06/13  8:42 PM      Result Value Range   Glucose-Capillary 127 (*) 70 - 99 mg/dL  GLUCOSE, CAPILLARY     Status: Abnormal   Collection Time    09/07/13  6:15 AM      Result Value Range   Glucose-Capillary 215 (*) 70 - 99 mg/dL    Physical Findings: AIMS: Facial and Oral Movements Muscles of Facial Expression: None, normal Lips and Perioral Area: None, normal Jaw: None, normal Tongue: None, normal,Extremity Movements Upper (arms, wrists, hands, fingers): None, normal Lower (legs, knees, ankles, toes): None, normal, Trunk Movements Neck, shoulders, hips: None, normal, Overall Severity Severity of abnormal movements (highest score from questions above): None, normal Incapacitation due to abnormal movements: None,  normal Patient's awareness of abnormal movements (rate only patient's report): No Awareness, Dental Status Current problems with teeth and/or dentures?: No Does patient usually wear dentures?: No  CIWA:  CIWA-Ar Total: 0 COWS:  COWS Total Score: 2  Psychiatric Specialty Exam: See Psychiatric Specialty Exam and Suicide Risk Assessment completed by Attending Physician prior to discharge.  Discharge destination:  Home  Is patient on multiple antipsychotic therapies at discharge:  No   Has Patient had three or more failed trials of antipsychotic monotherapy by history:  No  Recommended Plan for Multiple Antipsychotic Therapies: NA  Discharge Orders   Future Appointments Provider Department Dept Phone   09/20/2013 10:30 AM Geanie Berlin, LCSW [facility] [place] [phone]   Future Orders Complete By Expires   Discharge instructions  As directed    Comments:     Please follow up with your primary care provider for continued management of your medical problems such as diabetes.       Medication List    STOP taking these medications       divalproex 250 MG 24 hr tablet  Commonly known as:  DEPAKOTE ER     insulin aspart 100 UNIT/ML injection  Commonly known as:  novoLOG      TAKE these medications     Indication   albuterol 108 (90 BASE) MCG/ACT inhaler  Commonly known as:  PROVENTIL HFA;VENTOLIN HFA  Inhale 2 puffs into the lungs every 6 (six) hours as needed for wheezing.      benztropine 1 MG tablet  Commonly known as:  COGENTIN  Take 1 tablet (1 mg total) by mouth 2 (two) times daily.   Indication:  Extrapyramidal Reaction caused  by Medications     busPIRone 15 MG tablet  Commonly known as:  BUSPAR  Take 1 tablet (15 mg total) by mouth 3 (three) times daily.   Indication:  Anxiety Disorder, Symptoms of Feeling Anxious     cetirizine 10 MG tablet  Commonly known as:  ZYRTEC  Take 1 tablet (10 mg total) by mouth 2 (two) times daily.  For seasonal allergies.   Indication:  Hayfever     DULoxetine 60 MG capsule  Commonly known as:  CYMBALTA  Take 1 capsule (60 mg total) by mouth daily.   Indication:  Generalized Anxiety Disorder, Major Depressive Disorder     hydrOXYzine 50 MG tablet  Commonly known as:  ATARAX/VISTARIL  Take 1 tablet (50 mg total) by mouth every 6 (six) hours as needed (anxiety and panic attacks).      Insulin Glargine 100 UNIT/ML Sopn  Inject 70 Units into the skin at bedtime. For glycemic control of type 2 DM.   Indication:  Type 2 Diabetes     insulin NPH 100 UNIT/ML injection  Commonly known as:  HUMULIN N,NOVOLIN N  Inject 20 Units into the skin at bedtime.   Indication:  Type 2 Diabetes     metFORMIN 1000 MG tablet  Commonly known as:  GLUCOPHAGE  Take 1 tablet (1,000 mg total) by mouth 2 (two) times daily with a meal. For type two DM.   Indication:  Type 2 Diabetes     methocarbamol 750 MG tablet  Commonly known as:  ROBAXIN  Take 1 tablet (750 mg total) by mouth 3 (three) times daily. For muscle spasms.   Indication:  Musculoskeletal Pain, muscle spasm     nabumetone 750 MG tablet  Commonly known as:  RELAFEN  Take 1 tablet (750 mg total) by mouth 2 (two) times daily.   Indication:  Joint Damage causing Pain and Loss of Function     niacin 1000 MG CR tablet  Commonly known as:  NIASPAN  Take 1 tablet (1,000 mg total) by mouth daily. For lipid control.   Indication:  High Amount of Cholesterol in the Blood     omega-3 acid ethyl esters 1 G capsule  Commonly known as:  LOVAZA  Take 4 capsules (4 g total) by mouth at bedtime. For hyperlipidemia   Indication:  High Amount of Triglycerides in the Blood     ondansetron 4 MG tablet  Commonly known as:  ZOFRAN  TAKE 1 TABLET (4 MG TOTAL) BY MOUTH EVERY 8 (EIGHT) HOURS AS NEEDED FOR NAUSEA.      risperiDONE 2 MG disintegrating tablet  Commonly known as:  RISPERDAL M-TABS  Take 1 tablet (2 mg total) by mouth at bedtime.    Indication:  Easily Angered or Annoyed     simvastatin 20 MG tablet  Commonly known as:  ZOCOR  Take 1 tablet (20 mg total) by mouth daily. For hyperlipidemia.   Indication:  Type II A Hyperlipidemia     tadalafil 5 MG tablet  Commonly known as:  CIALIS  Take 1 tablet (5 mg total) by mouth daily. For erectile dysfunction.   Indication:  Erectile Dysfunction     Tapentadol HCl 250 MG Tb12  Take 250 mg by mouth 2 (two) times daily. For pain.   Indication:  Neuropathic Pain Assoc with Diabetic Peripheral Neuropathy     traZODone 150 MG tablet  Commonly known as:  DESYREL  Take 1 tablet (150 mg total) by mouth at bedtime.  Indication:  Trouble Sleeping           Follow-up Information   Follow up with [facility] On 09/09/2013. (Friday at 8:30 with Dr Lolly Mustache)    Contact information:   [address]  [facility]  [phone]      Follow up with [facility] On 09/20/2013. (Tuesday at 10:30 with Orvan July)      Follow-up recommendations:   Activity: as tolerated  Diet: healthy  Tests: routine  Other: patient to keep her follow up appointment   Comments:   Take all your medications as prescribed by your mental healthcare provider.  Report any adverse effects and or reactions from your medicines to your outpatient provider promptly.  Patient is instructed and cautioned to not engage in alcohol and or illegal drug use while on prescription medicines.  In the event of worsening symptoms, patient is instructed to call the crisis hotline, 911 and or go to the nearest ED for appropriate evaluation and treatment of symptoms.  Follow-up with your primary care provider for your other medical issues, concerns and or health care needs.   Total Discharge Time:  Greater than 30 minutes.  SignedFransisca Kaufmann NP-C 09/07/2013, 9:52 AM

## 2013-09-07 NOTE — Progress Notes (Signed)
Discharge note: Pt denies SI/HI/AVH. Pt received both written and verbal discharge instructions by  Sue Lush, RN. Pt received sample meds, prescriptions and all belongings. Pt received med Tapentadol HCI TB12 250 mg from med cart. Count 19 as of 1334. Med verified by Clinical research associate and Brayton El, RN. Pt safely discharged from [facility].

## 2013-09-08 NOTE — Discharge Summary (Signed)
Seen and agreed. Bassem Bernasconi, MD 

## 2013-09-08 NOTE — Progress Notes (Signed)
Patient Discharge Instructions:  Next Level Care Provider Has Access to the EMR, 09/08/13 Records provided to [facility] via [facility]/Epic access.  [name], 09/08/2013, 3:49 PM

## 2013-09-09 ENCOUNTER — Ambulatory Visit (HOSPITAL_COMMUNITY): Payer: Self-pay | Admitting: Psychiatry

## 2013-09-11 ENCOUNTER — Other Ambulatory Visit: Payer: Self-pay | Admitting: Family

## 2013-09-14 ENCOUNTER — Telehealth: Payer: Self-pay | Admitting: Family

## 2013-09-14 NOTE — Telephone Encounter (Signed)
Left message for patient to return my call.

## 2013-09-14 NOTE — Telephone Encounter (Signed)
Please call pt and arrange hospital follow up visit.

## 2013-09-16 NOTE — Telephone Encounter (Signed)
Left message for patient to return my call.

## 2013-09-19 ENCOUNTER — Other Ambulatory Visit: Payer: Self-pay | Admitting: Family

## 2013-09-19 NOTE — Telephone Encounter (Signed)
Lovaza refill sent to pharmacy. Pt is due for hospital follow up. Per previous phone note, unable to reach pt by phone to schedule appt so letter mailed to pt today to call and schedule hospital follow up.

## 2013-09-19 NOTE — Telephone Encounter (Signed)
Letter has been mailed.

## 2013-09-19 NOTE — Telephone Encounter (Signed)
Left message for patient to return my call.

## 2013-09-20 ENCOUNTER — Ambulatory Visit (HOSPITAL_COMMUNITY): Payer: Self-pay | Admitting: Licensed Clinical Social Worker

## 2013-09-20 ENCOUNTER — Encounter (HOSPITAL_COMMUNITY): Payer: Self-pay | Admitting: Licensed Clinical Social Worker

## 2013-09-20 NOTE — Progress Notes (Signed)
Patient ID: [name], male   DOB: January 25, 1976, 37 y.o.   MRN: [id number] Patient was a no show/ no call for today's appointment.

## 2013-09-21 ENCOUNTER — Other Ambulatory Visit: Payer: Self-pay | Admitting: Family

## 2013-09-26 ENCOUNTER — Ambulatory Visit: Payer: Self-pay | Admitting: Family

## 2013-10-01 ENCOUNTER — Other Ambulatory Visit (HOSPITAL_COMMUNITY): Payer: Self-pay | Admitting: Psychiatry

## 2013-10-01 DIAGNOSIS — F319 Bipolar disorder, unspecified: Secondary | ICD-10-CM

## 2013-10-03 ENCOUNTER — Encounter: Payer: Self-pay | Admitting: Family

## 2013-10-03 ENCOUNTER — Ambulatory Visit (INDEPENDENT_AMBULATORY_CARE_PROVIDER_SITE_OTHER): Payer: No Typology Code available for payment source | Admitting: Family

## 2013-10-03 ENCOUNTER — Other Ambulatory Visit: Payer: Self-pay | Admitting: *Deleted

## 2013-10-03 VITALS — BP 124/84 | HR 101 | Temp 98.2°F | Resp 16 | Ht 72.0 in | Wt 211.0 lb

## 2013-10-03 DIAGNOSIS — S62609A Fracture of unspecified phalanx of unspecified finger, initial encounter for closed fracture: Secondary | ICD-10-CM

## 2013-10-03 DIAGNOSIS — E1149 Type 2 diabetes mellitus with other diabetic neurological complication: Secondary | ICD-10-CM

## 2013-10-03 DIAGNOSIS — IMO0001 Reserved for inherently not codable concepts without codable children: Secondary | ICD-10-CM

## 2013-10-03 DIAGNOSIS — F411 Generalized anxiety disorder: Secondary | ICD-10-CM

## 2013-10-03 DIAGNOSIS — F419 Anxiety disorder, unspecified: Secondary | ICD-10-CM

## 2013-10-03 LAB — BASIC METABOLIC PANEL
BUN: 6 mg/dL (ref 6–23)
Creat: 0.95 mg/dL (ref 0.50–1.35)
Glucose, Bld: 339 mg/dL — ABNORMAL HIGH (ref 70–99)
Potassium: 4.5 mEq/L (ref 3.5–5.3)

## 2013-10-03 LAB — HEMOGLOBIN A1C: Hgb A1c MFr Bld: 10.4 % — ABNORMAL HIGH (ref <5.7)

## 2013-10-03 MED ORDER — TRAZODONE HCL 150 MG PO TABS: 150.0000 mg | ORAL_TABLET | Freq: Every day | ORAL | Status: DC

## 2013-10-03 MED ORDER — RISPERIDONE 2 MG PO TBDP: 2.0000 mg | ORAL_TABLET | Freq: Every day | ORAL | Status: DC

## 2013-10-03 MED ORDER — INSULIN GLARGINE 100 UNIT/ML SOLOSTAR PEN: 60.0000 [IU] | PEN_INJECTOR | Freq: Every day | SUBCUTANEOUS | Status: DC

## 2013-10-03 NOTE — Assessment & Plan Note (Signed)
Improving with buspar- encouraged pt to keep upcoming apt with psychiatry.

## 2013-10-03 NOTE — Patient Instructions (Signed)
Please complete lab work prior to leaving. Keep upcoming appointment with Dr. Lolly Mustache. Schedule follow up with endocrinology. You will be contacted about your referral to orthopedics. Follow up with [place] in 2 months.

## 2013-10-03 NOTE — Progress Notes (Signed)
Pre visit review using our clinic review tool, if applicable. No additional management support is needed unless otherwise documented below in the visit note. 

## 2013-10-03 NOTE — Progress Notes (Signed)
Subjective:    Patient ID: [name], male    DOB: [birth year], 37 y.o.   MRN: [id number]  HPI  Mr. [name] is a 37 yr old male who presents today for hospital follow up. Records are reviewed.  Pt was admitted 08/29/13 with suicidal ideation and anxiety. He was hospitalized x 9 days.  He was started on buspar and risperdal 2mg  HS. Reports that he had legs and muscles "locking up." as a result, risperdal was d/c'd and he was placed on cogentin.  Pt reports  due to stumbling and confusion he stopped the cogentin.  He had a follow up for outpatient therapy last Tuesday which he no showed.   Scheduled to see Dr. Lolly Mustache on 12/2 for psych follow up.   Reports panic attacks are becoming "farther apart."  Much better than it was.  Reports that each day is getting a little better.  Denies SI/HI.    DM2- reports that his sugars have been running very high.  He has lost about 30 pounds in the last 6 weeks.   Wt Readings from Last 3 Encounters:  10/03/13 211 lb (95.709 kg)  08/29/13 221 lb (100.245 kg)  08/17/13 240 lb (108.863 kg)   Reports that while he was in the hospital his insulin was decreased due to hypoglycemia. Reports that he has been out of lantus for last several weeks.  He reports sugars have been running 200-300.  He continues humulin.  Right hand fracture-  Reports increased pain in the right hand.  Non-healing fracture of R 5th metacarpal.  Reports elevated PTH per Dr. Pete Glatter. He was scheduled for MRI but had to cancel.    Review of Systems See HPI  Past Medical History  Diagnosis Date  . Allergy   . Asthma   . Depression   . GERD (gastroesophageal reflux disease)   . Hypertension   . Hyperlipidemia   . Chronic pain disorder     Sees [facility]  . Urinary incontinence     detrusor instability  . Hypogonadism   . Diabetes mellitus     Type II    History   Social History  . Marital Status: Married    Spouse Name: N/A    Number of Children: 2   . Years of Education: N/A   Occupational History  . Disabled     chronic pain   Social History Main Topics  . Smoking status: Former Smoker -- 1.50 packs/day    Types: Cigarettes    Quit date: 08/29/2013  . Smokeless tobacco: Never Used  . Alcohol Use: No  . Drug Use: No     Comment: stopped cocaine--2003. Normal drug screen March 2008  . Sexual Activity: Yes    Birth Control/ Protection: None   Other Topics Concern  . Not on file   Social History Narrative  . No narrative on file    Past Surgical History  Procedure Laterality Date  . Appendectomy    . Hernia repair    . Nasal sinus surgery  2008    Family History  Problem Relation Age of Onset  . Diabetes Mother   . Hypertension Mother   . Cirrhosis Mother   . Depression Mother   . Bipolar disorder Mother   . Arthritis Father 49    osteoarthritis  . Bipolar disorder Father   . Diabetes Sister     borderline  . Heart disease Paternal Uncle 2    MI  . Heart  disease Maternal Grandfather     late 60's--MI  . Cancer Paternal Grandmother 35    lung  . Bipolar disorder Paternal Grandmother   . Heart disease Paternal Grandfather 55    MI  . Bipolar disorder Paternal Grandfather     Allergies  Allergen Reactions  . Ciprofloxacin Hives    Current Outpatient Prescriptions on File Prior to Visit  Medication Sig Dispense Refill  . busPIRone (BUSPAR) 15 MG tablet Take 1 tablet (15 mg total) by mouth 3 (three) times daily.  90 tablet  0  . DULoxetine (CYMBALTA) 60 MG capsule Take 1 capsule (60 mg total) by mouth daily.  30 capsule  0  . LOVAZA 1 G capsule TAKE 4 CAPSULES BY MOUTH DAILY  120 capsule  0  . metFORMIN (GLUCOPHAGE) 1000 MG tablet TAKE 1 TABLET BY MOUTH TWICE A DAY AS DIRECTED  60 tablet  2  . methocarbamol (ROBAXIN) 750 MG tablet Take 1 tablet (750 mg total) by mouth 3 (three) times daily. For muscle spasms.      . niacin (NIASPAN) 1000 MG CR tablet Take 1 tablet (1,000 mg total) by mouth daily. For  lipid control.  30 tablet  2  . ondansetron (ZOFRAN) 4 MG tablet TAKE 1 TABLET (4 MG TOTAL) BY MOUTH EVERY 8 (EIGHT) HOURS AS NEEDED FOR NAUSEA.  20 tablet  0  . simvastatin (ZOCOR) 20 MG tablet Take 1 tablet (20 mg total) by mouth daily. For hyperlipidemia.  30 tablet  2  . [DISCONTINUED] albuterol (PROVENTIL) 90 MCG/ACT inhaler Inhale 2 puffs into the lungs every 6 (six) hours as needed for wheezing.  17 g  12   No current facility-administered medications on file prior to visit.    BP 124/84  Pulse 101  Temp(Src) 98.2 F (36.8 C) (Oral)  Resp 16  Ht 6' (1.829 m)  Wt 211 lb (95.709 kg)  BMI 28.61 kg/m2  SpO2 99%       Objective:   Physical Exam  Constitutional: He appears well-developed and well-nourished.  Noticeably thinner  HENT:  Head: Normocephalic and atraumatic.  Cardiovascular: Normal rate and regular rhythm.   No murmur heard. Pulmonary/Chest: Effort normal and breath sounds normal. No respiratory distress. He has no wheezes. He has no rales. He exhibits no tenderness.  Musculoskeletal:  R hand is without swelling  Psychiatric: He has a normal mood and affect. His behavior is normal. Judgment and thought content normal.          Assessment & Plan:  ? Elevated PTH- I have advised pt to contact Dr. Laverle Hobby office to reschedule MRI of the brain.

## 2013-10-03 NOTE — Telephone Encounter (Signed)
Medications requested/Actions taken at MD direction Depakote ER 250 mg-1 at bedtime: Discontinued on discharge from [facility]Do not refill at this time per Dr.Arfeen Trazodone 100 mg - 1 at bedtime:Dose changed on discharge from [facility]Give discharge dose per Dr.Arfeen Risperidone M-tab 1 mg, twice daily: Changed to Risperidone M-tab 2 mg at bedtime: Give discharge dose per Dr.Arfeen

## 2013-10-04 ENCOUNTER — Encounter: Payer: Self-pay | Admitting: Family

## 2013-10-04 DIAGNOSIS — S62609A Fracture of unspecified phalanx of unspecified finger, initial encounter for closed fracture: Secondary | ICD-10-CM | POA: Insufficient documentation

## 2013-10-04 NOTE — Assessment & Plan Note (Signed)
Improving on current meds. Pt instructed to follow up with psych as scheduled.

## 2013-10-04 NOTE — Assessment & Plan Note (Signed)
Uncontrolled due to pt being out of lantus.  Resume latus, pt to arrange follow up with endo.

## 2013-10-04 NOTE — Assessment & Plan Note (Signed)
Will likely need surgical repair- refer to ortho.

## 2013-10-05 ENCOUNTER — Telehealth: Payer: Self-pay | Admitting: *Deleted

## 2013-10-05 ENCOUNTER — Other Ambulatory Visit (HOSPITAL_COMMUNITY): Payer: Self-pay | Admitting: *Deleted

## 2013-10-05 MED ORDER — INSULIN GLARGINE 100 UNIT/ML SOLOSTAR PEN: PEN_INJECTOR | SUBCUTANEOUS | Status: DC

## 2013-10-05 NOTE — Telephone Encounter (Signed)
Buspar should not be taken "PRN" continue TID.

## 2013-10-05 NOTE — Telephone Encounter (Signed)
Received call from pt wanting to know if he can take an extra Buspar when he is having and anxiety episode? States he has called his psychiatrist for a refill on his Vistaril and is awaiting a return call from them. Advised her that we do not currently have vistaril on pt's med list and she is unable to recall pt's dose so I have not added it to his current med list yet. Also asked that we re-send lantus rx for the flex pen instead of the vials. Resent rx. Please advise re: Buspar.

## 2013-10-05 NOTE — Telephone Encounter (Signed)
Left detailed message on cell voicemail re: below instructions.

## 2013-10-11 ENCOUNTER — Ambulatory Visit (INDEPENDENT_AMBULATORY_CARE_PROVIDER_SITE_OTHER): Payer: No Typology Code available for payment source | Admitting: Psychiatry

## 2013-10-11 ENCOUNTER — Encounter (HOSPITAL_COMMUNITY): Payer: Self-pay | Admitting: Psychiatry

## 2013-10-11 VITALS — BP 148/93 | HR 115 | Ht 73.0 in | Wt 215.0 lb

## 2013-10-11 DIAGNOSIS — F319 Bipolar disorder, unspecified: Secondary | ICD-10-CM

## 2013-10-11 MED ORDER — LAMOTRIGINE 25 MG PO TABS: ORAL_TABLET | ORAL | Status: DC

## 2013-10-11 MED ORDER — TRAZODONE HCL 100 MG PO TABS: 100.0000 mg | ORAL_TABLET | Freq: Every day | ORAL | Status: DC

## 2013-10-11 MED ORDER — BUSPIRONE HCL 15 MG PO TABS: 15.0000 mg | ORAL_TABLET | Freq: Three times a day (TID) | ORAL | Status: DC

## 2013-10-11 MED ORDER — DULOXETINE HCL 60 MG PO CPEP: 60.0000 mg | ORAL_CAPSULE | Freq: Every day | ORAL | Status: DC

## 2013-10-11 MED ORDER — HYDROXYZINE PAMOATE 50 MG PO CAPS: 50.0000 mg | ORAL_CAPSULE | Freq: Every day | ORAL | Status: DC | PRN

## 2013-10-11 NOTE — Progress Notes (Signed)
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           5.7-6.4%   Increased risk of developing Diabetes Mellitus           References:Diagnosis and Classification of Diabetes Mellitus,Diabetes     Care,2011,34(Suppl 1):S62-S69 and Standards of Medical Care in             Diabetes - 2011,Diabetes Care,2011,34 (Suppl 1):S11-S61.         Mean Plasma Glucose 252 (*) <117 mg/dL  BASIC METABOLIC PANEL     Status: Abnormal   Collection Time    10/03/13  3:52 PM      Result Value Range   Sodium 136  135 - 145 mEq/L   Potassium 4.5  3.5 - 5.3 mEq/L   Chloride 98  96 - 112 mEq/L  CO2 24  19 - 32 mEq/L   Glucose, Bld 339 (*) 70 - 99 mg/dL   BUN 6  6 - 23 mg/dL   Creat 1.61  0.96 - 0.45 mg/dL   Calcium 40.9  8.4 - 81.1 mg/dL      Physical  Exam: Constitutional:  BP 148/93  Pulse 115  Ht 6\' 1"  (1.854 m)  Wt 215 lb (97.523 kg)  BMI 28.37 kg/m2  Musculoskeletal: Strength & Muscle Tone: within normal limits Gait & Station: normal Patient leans: N/A  Mental Status Examination;  Patient is casually dressed and fairly groomed.  He maintains fair eye contact.  His speech is monotonous but coherent.  He described his mood is anxious and his affect is mood appropriate.  He denies any paranoia or any visual or auditory hallucination.  He has fine tremors.  He has some thought blocking and his attention and concentration is fair.  There were no delusion or obsession present at this time.  His fund of knowledge is average.  He is alert and oriented x3.  There were no tremors or shakes present.  His insight judgment and impulse control is good.   Medical Decision Making (Choose Three): Established Problem, Stable/Improving (1), Review or order clinical lab tests (1), Review and summation of old records (2), Established Problem, Worsening (2), Review of Last Therapy Session (1), Review of Medication Regimen & Side Effects (2) and Review of New Medication or Change in Dosage (2)  Assessment: Axis I: Bipolar disorder NOS, rule out posttraumatic stress disorder  Axis II: Deferred  Axis III:  Past Medical History  Diagnosis Date  . Allergy   . Asthma   . Depression   . GERD (gastroesophageal reflux disease)   . Hypertension   . Hyperlipidemia   . Chronic pain disorder     Sees [facility]  . Urinary incontinence     detrusor instability  . Hypogonadism   . Diabetes mellitus     Type II    Axis IV: Mild to moderate   Plan:  Patient is not taking any benzodiazepine or using any narcotic pain medications.  He is taking BuSpar , trazodone and Cymbalta.  Cannot find his Vistaril .  I will start the Vistaril and recommended to try Lamictal which he has never tried before.  Recommend to see therapist for coping and  social skills.  I reviewed her blood desserts, discharge summary from the hospital .  Patient is no longer taking Depakote and Risperdal.  I recommend to decrease trazodone to 100 mg because it may be causing blurry vision.  Discussed in detail the risks and benefits of medication especially limit can cause rash and that case he needs to stop the medication immediately.  Patient and wife tell that she is also taking Lamictal and she is aware about the side effects and benefits.  I recommend to call us back if there is any question or any concern.  Followup in 3-4 weeks.  Time spent 25 minutes.  More than 50% of the time spent in psychoeducation, counseling and coordination of care.  Discuss safety plan that anytime having active suicidal thoughts or homicidal thoughts then patient need to call 911 or go to the local emergency room.  Deval Mroczka T., MD 10/11/2013

## 2013-10-18 ENCOUNTER — Other Ambulatory Visit: Payer: Self-pay | Admitting: Family

## 2013-10-21 ENCOUNTER — Encounter: Payer: Self-pay | Admitting: Endocrinology

## 2013-10-21 ENCOUNTER — Ambulatory Visit (INDEPENDENT_AMBULATORY_CARE_PROVIDER_SITE_OTHER): Payer: No Typology Code available for payment source | Admitting: Endocrinology

## 2013-10-21 ENCOUNTER — Other Ambulatory Visit: Payer: Self-pay | Admitting: *Deleted

## 2013-10-21 VITALS — BP 118/70 | HR 107 | Temp 98.3°F | Resp 12 | Ht 73.0 in | Wt 219.5 lb

## 2013-10-21 DIAGNOSIS — IMO0001 Reserved for inherently not codable concepts without codable children: Secondary | ICD-10-CM

## 2013-10-21 DIAGNOSIS — E291 Testicular hypofunction: Secondary | ICD-10-CM

## 2013-10-21 DIAGNOSIS — R5383 Other fatigue: Secondary | ICD-10-CM

## 2013-10-21 DIAGNOSIS — E785 Hyperlipidemia, unspecified: Secondary | ICD-10-CM

## 2013-10-21 DIAGNOSIS — R5381 Other malaise: Secondary | ICD-10-CM

## 2013-10-21 LAB — COMPREHENSIVE METABOLIC PANEL
ALT: 38 U/L (ref 0–53)
AST: 24 U/L (ref 0–37)
Albumin: 4.6 g/dL (ref 3.5–5.2)
Alkaline Phosphatase: 86 U/L (ref 39–117)
Creatinine, Ser: 0.9 mg/dL (ref 0.4–1.5)
GFR: 102.49 mL/min (ref >60.00)
Potassium: 4.2 mEq/L (ref 3.5–5.1)
Sodium: 135 mEq/L (ref 135–145)
Total Bilirubin: 0.6 mg/dL (ref 0.3–1.2)
Total Protein: 7.5 g/dL (ref 6.0–8.3)

## 2013-10-21 MED ORDER — PIOGLITAZONE HCL 15 MG PO TABS: 15.0000 mg | ORAL_TABLET | Freq: Every day | ORAL | Status: DC

## 2013-10-21 MED ORDER — INSULIN ISOPHANE HUMAN 100 UNIT/ML KWIKPEN: 20.0000 [IU] | PEN_INJECTOR | Freq: Every day | SUBCUTANEOUS | Status: DC

## 2013-10-21 NOTE — Progress Notes (Signed)
[name]   Reason for Appointment : Followup for Type 2 Diabetes   History of Present Illness          Diagnosis: Type 2 diabetes mellitus, date of diagnosis: 2005     Past history: He had previously been treated with oral hypoglycemic agents but required insulin in 2009 when he had marked hyperglycemia with ketonuria. Subsequently has been on insulin, continuously since about 2010, usually basal bolus regimen. Also appeared to improve control but using Actoplusmet. However Actos was stopped by his cardiologist because of Edema. In 2011 he was taking only about 30 units of Lantus. Subsequently he required progressively higher doses of Lantus without adequate overnight glucose control. He thinks his A1c had been usually over 9% but no records available  Recent history: On his initial visit here he was given 20 units of NPH in addition to this at bedtime Lantus because of significantly high fasting readings. However although his blood sugar improved it wasn't optimal. Did not followup since he was admitted for psychiatric reasons. He is back only on Lantus and very small doses of NovoLog at mealtimes since his discharge with poor control most of the day especially late evening around supper time   INSULIN regimen is described as:  Lantus 70 hs,  5+ sliding scale NovoLog up to 12 units with meals Glucose monitoring:  done 3-4 times a day         Glucometer:  Accu-Chek       Blood Glucose readings from meter download:  PREMEAL Breakfast Lunch Dinner Bedtime Overall  Glucose range:  208-277   91-356   154-349   212    Mean/median:  245     261    POST-MEAL PC Breakfast PC Lunch PC Dinner  Glucose range:  182, 246   ? 184   Mean/median:      Hypoglycemia: None               Oral hypoglycemic drugs the patient is taking are: Metformin       Side effects from medications have been:edema from actos  previously  Self-care: The diet that the patient has been following is: Variable     Meals: 3 meals per day.  avoiding drinks with sugar, trying to eat more salads Physical activity: exercise: None      Dietician visit: Most recent: Unknown           Wt Readings from Last 3 Encounters:  10/21/13 219 lb 8 oz (99.565 kg)  10/11/13 215 lb (97.523 kg)  10/03/13 211 lb (95.709 kg)   Retinal exam: Most recent: 2014, normal  Lab Results  Component Value Date   HGBA1C 10.4* 10/03/2013   PROBLEM #2: ? HYPOGONADISM:  He had been treated by his urologist for about 2 years for a diagnosis of hypogonadism with  Testopel  He had not been back for treatment for a year and a half and an evaluation in October his prolactin level was high at 43 His total testosterone was minimally low at 330 and LH was 5.1  He appears to have been taking respiratory when he was tested for the prolactin He complains of feeling tired, weak and difficulty with focusing and memory He also has lost weight this year      Medication List       This list is accurate as of: 10/21/13  3:11 PM.  Always use your most recent med list.  azelastine 137 MCG/SPRAY nasal spray  Commonly known as:  ASTELIN  Place into both nostrils 2 (two) times daily. Use in each nostril as directed     cetirizine 10 MG tablet  Commonly known as:  ZYRTEC  Take 10 mg by mouth daily.     DULoxetine 60 MG capsule  Commonly known as:  CYMBALTA  Take 1 capsule (60 mg total) by mouth daily.     hydrOXYzine 50 MG capsule  Commonly known as:  VISTARIL  Take 1 capsule (50 mg total) by mouth daily as needed for anxiety.     Insulin Glargine 100 UNIT/ML Sopn  Commonly known as:  LANTUS SOLOSTAR  Inject 60 units into the skin at bedtime for glycemic control.  Dx 250.02     lamoTRIgine 25 MG tablet  Commonly known as:  LAMICTAL  Take 1 tab daily for  1 week and than 2 tab daily     LOVAZA 1 G capsule  Generic drug:  omega-3 acid ethyl esters  TAKE 4 CAPSULES BY MOUTH DAILY     metFORMIN 1000 MG tablet   Commonly known as:  GLUCOPHAGE  TAKE 1 TABLET BY MOUTH TWICE A DAY AS DIRECTED     methocarbamol 750 MG tablet  Commonly known as:  ROBAXIN  Take 1 tablet (750 mg total) by mouth 3 (three) times daily. For muscle spasms.     niacin 1000 MG CR tablet  Commonly known as:  NIASPAN  Take 1 tablet (1,000 mg total) by mouth daily. For lipid control.     NOVOLOG FLEXPEN 100 UNIT/ML Sopn FlexPen  Generic drug:  insulin aspart  Inject 5 Units into the skin 3 (three) times daily with meals. On a sliding scale     ondansetron 4 MG tablet  Commonly known as:  ZOFRAN  TAKE 1 TABLET (4 MG TOTAL) BY MOUTH EVERY 8 (EIGHT) HOURS AS NEEDED FOR NAUSEA.     simvastatin 20 MG tablet  Commonly known as:  ZOCOR  Take 1 tablet (20 mg total) by mouth daily. For hyperlipidemia.     traZODone 100 MG tablet  Commonly known as:  DESYREL  Take 1 tablet (100 mg total) by mouth at bedtime.        Allergies:  Allergies  Allergen Reactions  . Ciprofloxacin Hives    Past Medical History  Diagnosis Date  . Allergy   . Asthma   . Depression   . GERD (gastroesophageal reflux disease)   . Hypertension   . Hyperlipidemia   . Chronic pain disorder     Sees [facility]  . Urinary incontinence     detrusor instability  . Hypogonadism   . Diabetes mellitus     Type II    Past Surgical History  Procedure Laterality Date  . Appendectomy    . Hernia repair    . Nasal sinus surgery  2008    Family History  Problem Relation Age of Onset  . Diabetes Mother   . Hypertension Mother   . Cirrhosis Mother   . Depression Mother   . Bipolar disorder Mother   . Arthritis Father 49    osteoarthritis  . Bipolar disorder Father   . Diabetes Sister     borderline  . Heart disease Paternal Uncle 8    MI  . Heart disease Maternal Grandfather     late 60's--MI  . Cancer Paternal Grandmother 58    lung  . Bipolar disorder Paternal Grandmother   .  Heart disease Paternal Grandfather 46     MI  . Bipolar disorder Paternal Grandfather     Social History:  reports that he has been smoking Cigarettes.  He has been smoking about 1.50 packs per day. He has never used smokeless tobacco. He reports that he does not drink alcohol or use illicit drugs.    Review of Systems       Lipids: Long-standing history of hyperlipidemia, primarily high triglycerides, no recent results available. On treatment with niacin and simvastatin    He has long-standing depression, he had been on Risperdal twice a day but is now on Lamictal; this was started during his recent hospitalization. On Cymbalta for quite some time also as well as Depakote .      Has  Numbness, tingling or burning in  feet  Was on lyrica previously but this was for fibromyalgia   Physical Examination:  BP 118/70  Pulse 107  Temp(Src) 98.3 F (36.8 C)  Resp 12  Ht 6\' 1"  (1.854 m)  Wt 219 lb 8 oz (99.565 kg)  BMI 28.97 kg/m2  SpO2 97%  He appears anxious  ASSESSMENT:  Diabetes type 2, uncontrolled - 250.02    He has had long-standing diabetes  which is now more difficult to control in the last couple of years even though his weight is relatively better. Probably because of insulin resistance he is requiring a larger amount of insulin compared to before His blood sugars appear to be high toward the day except when he takes extra NovoLog His fasting readings are still markedly increased and since he did respond partially to NPH at bedtime previously will need to try this again in increasing doses His highest blood sugars appear to be fasting but does need a basal amount  of mealtime coverage in addition to a sliding scale and this was explained to him Also  taking maximum dose metformin; previously had edema from Actos but this was not consistent His diet seems fairly good but will need to review with dietitian  He is a little active but can do better with exercise  Complications: Has minimal neuropathy, not clearly  symptomatic  HYPERLIPIDEMIA: He has had diabetic dyslipidemia with high triglycerides in the past.  Because of insulin resistance may temporarily leave off niacin especially as he lost weight   PLAN:   Will  restart at least  NPH insulin 20 units at bedtime to help control overnight hyperglycemia while  continuing his Lantus 70 units for now. Discussed titrating this to get morning sugars down below 150 at least He will take Novolog 10-14 units at meals based on meal size and then increase if his postprandial readings are still high. May continue to add correction doses also He will check more readings after supper also  Discussed needing to take a basic amount of mealtime coverage with every meal of NovoLog and higher doses for high readings Needs more consistent exercise Since he had previously benefited from Actos previously will  start with 15 mg. May need diuretic like spironolactone if he has any edema consider trying this along with diuretics to counteract potential edema Also may be a candidate for Victoza which would help with reduced hepatic glucose output  Have asked him to stop niacin   ? Hypogonadism: He will have free testosterone level checked today. If this is normal he does not need any replacement therapy He has multiple medical problems and psychiatric issues that are causing nonspecific symptoms and difficult to  know if these are related to hypogonadism Discussed that he has a mild increase in prolactin level which is typical effect of Risperdal that he was taking and is insignificant  Counseling time over 50% of today's 25 minute visit   [name] 10/21/2013, 3:11 PM   Office Visit on 10/21/2013  Component Date Value Range Status  . Sodium 10/21/2013 135  135 - 145 mEq/L Final  . Potassium 10/21/2013 4.2  3.5 - 5.1 mEq/L Final  . Chloride 10/21/2013 99  96 - 112 mEq/L Final  . CO2 10/21/2013 25  19 - 32 mEq/L Final  . Glucose, Bld 10/21/2013 317* 70 - 99 mg/dL  Final  . BUN 40/98/1191 9  6 - 23 mg/dL Final  . Creatinine, Ser 10/21/2013 0.9  0.4 - 1.5 mg/dL Final  . Total Bilirubin 10/21/2013 0.6  0.3 - 1.2 mg/dL Final  . Alkaline Phosphatase 10/21/2013 86  39 - 117 U/L Final  . AST 10/21/2013 24  0 - 37 U/L Final  . ALT 10/21/2013 38  0 - 53 U/L Final  . Total Protein 10/21/2013 7.5  6.0 - 8.3 g/dL Final  . Albumin 47/82/9562 4.6  3.5 - 5.2 g/dL Final  . Calcium 13/06/6577 9.7  8.4 - 10.5 mg/dL Final  . GFR 46/96/2952 102.49  >60.00 mL/min Final  . Free T4 10/21/2013 0.76  0.60 - 1.60 ng/dL Final

## 2013-10-21 NOTE — Patient Instructions (Addendum)
Novolog 10-14 units at meals (14 at supper)  NPH 20 at bedtime and may go up to 25 if still > 150 on Monday am  Actos 15mg  daily  Stop Niacin

## 2013-10-24 LAB — TESTOSTERONE, FREE, TOTAL, SHBG
Testosterone, Free: 42.9 pg/mL — ABNORMAL LOW (ref 47.0–244.0)
Testosterone-% Free: 1.7 % (ref 1.6–2.9)

## 2013-10-24 NOTE — Progress Notes (Signed)
Quick Note:  Please let patient know that the testosterone result is low and can proceed to see the urologist, will fax to him also ______

## 2013-10-28 ENCOUNTER — Telehealth: Payer: Self-pay | Admitting: *Deleted

## 2013-10-28 NOTE — Telephone Encounter (Signed)
Reduce Lantus insulin by 15 units and Humulin N. by 5 units

## 2013-10-28 NOTE — Telephone Encounter (Signed)
Patient wife called, her husbands sugars upon wakening are very low, yesterday morning it was 61, this morning it was 52, she wants to know if they need to adjust his night time dose?

## 2013-10-28 NOTE — Telephone Encounter (Signed)
Noted, pt is aware 

## 2013-10-31 ENCOUNTER — Telehealth: Payer: Self-pay | Admitting: Family

## 2013-10-31 NOTE — Telephone Encounter (Signed)
Refill-simvastatin 20mg  tablet. Take one tablet by mouth daily. Qty 30 last fill 11.2.14

## 2013-11-01 ENCOUNTER — Ambulatory Visit (HOSPITAL_COMMUNITY): Payer: Self-pay | Admitting: Psychiatry

## 2013-11-02 ENCOUNTER — Telehealth: Payer: Self-pay | Admitting: *Deleted

## 2013-11-02 MED ORDER — NABUMETONE 500 MG PO TABS: 500.0000 mg | ORAL_TABLET | Freq: Every day | ORAL | Status: DC

## 2013-11-02 MED ORDER — SIMVASTATIN 20 MG PO TABS: 20.0000 mg | ORAL_TABLET | Freq: Every day | ORAL | Status: DC

## 2013-11-02 NOTE — Telephone Encounter (Signed)
Rx sent 

## 2013-11-02 NOTE — Telephone Encounter (Signed)
Refill sent to pharmacy.   

## 2013-11-02 NOTE — Telephone Encounter (Signed)
Notified pt. 

## 2013-11-02 NOTE — Telephone Encounter (Signed)
Received call from pt's wife stating pt is no longer seeing pain management and is not on any pain medications. Wants to know if we could send RX for Nabumetone as pt used to take this for joint pain and inflammation. Please advise.

## 2013-11-07 ENCOUNTER — Telehealth (HOSPITAL_COMMUNITY): Payer: Self-pay

## 2013-11-07 ENCOUNTER — Other Ambulatory Visit: Payer: Self-pay | Admitting: *Deleted

## 2013-11-07 NOTE — Telephone Encounter (Signed)
Appt 12/31.MD will discuss /give refills at appt. Sent fax to pharmacy with that message

## 2013-11-09 ENCOUNTER — Telehealth (HOSPITAL_COMMUNITY): Payer: Self-pay

## 2013-11-09 ENCOUNTER — Encounter (HOSPITAL_COMMUNITY): Payer: Self-pay | Admitting: Psychiatry

## 2013-11-09 ENCOUNTER — Ambulatory Visit (INDEPENDENT_AMBULATORY_CARE_PROVIDER_SITE_OTHER): Payer: No Typology Code available for payment source | Admitting: Psychiatry

## 2013-11-09 VITALS — BP 130/90 | HR 100 | Ht 73.0 in | Wt 225.4 lb

## 2013-11-09 DIAGNOSIS — F319 Bipolar disorder, unspecified: Secondary | ICD-10-CM

## 2013-11-09 MED ORDER — HYDROXYZINE PAMOATE 50 MG PO CAPS: 50.0000 mg | ORAL_CAPSULE | Freq: Every day | ORAL | Status: DC | PRN

## 2013-11-09 MED ORDER — DULOXETINE HCL 60 MG PO CPEP: 60.0000 mg | ORAL_CAPSULE | Freq: Every day | ORAL | Status: DC

## 2013-11-09 MED ORDER — LAMOTRIGINE 100 MG PO TABS: ORAL_TABLET | ORAL | Status: DC

## 2013-11-09 MED ORDER — TRAZODONE HCL 100 MG PO TABS: 100.0000 mg | ORAL_TABLET | Freq: Every day | ORAL | Status: DC
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
## 2013-11-11 ENCOUNTER — Encounter: Payer: Self-pay | Admitting: Physician Assistant

## 2013-11-11 ENCOUNTER — Ambulatory Visit (INDEPENDENT_AMBULATORY_CARE_PROVIDER_SITE_OTHER): Payer: PRIVATE HEALTH INSURANCE | Admitting: Physician Assistant

## 2013-11-11 ENCOUNTER — Ambulatory Visit (HOSPITAL_BASED_OUTPATIENT_CLINIC_OR_DEPARTMENT_OTHER)
Admission: RE | Admit: 2013-11-11 | Discharge: 2013-11-11 | Disposition: A | Discharge status: Home / Self Care | Payer: PRIVATE HEALTH INSURANCE | Source: Ambulatory Visit | Attending: Physician Assistant | Admitting: Physician Assistant

## 2013-11-11 VITALS — BP 126/82 | HR 102 | Temp 98.2°F | Resp 16 | Ht 73.0 in | Wt 222.5 lb

## 2013-11-11 DIAGNOSIS — M719 Bursopathy, unspecified: Secondary | ICD-10-CM

## 2013-11-11 DIAGNOSIS — M67912 Unspecified disorder of synovium and tendon, left shoulder: Secondary | ICD-10-CM

## 2013-11-11 DIAGNOSIS — M542 Cervicalgia: Secondary | ICD-10-CM | POA: Insufficient documentation

## 2013-11-11 DIAGNOSIS — M67919 Unspecified disorder of synovium and tendon, unspecified shoulder: Secondary | ICD-10-CM

## 2013-11-11 MED ORDER — CYCLOBENZAPRINE HCL 10 MG PO TABS: 10.0000 mg | ORAL_TABLET | Freq: Three times a day (TID) | ORAL | Status: DC | PRN

## 2013-11-11 MED ORDER — METHYLPREDNISOLONE (PAK) 4 MG PO TABS: ORAL_TABLET | ORAL | Status: DC

## 2013-11-11 NOTE — Progress Notes (Signed)
Pre visit review using our clinic review tool, if applicable. No additional management support is needed unless otherwise documented below in the visit note/SLS  

## 2013-11-11 NOTE — Patient Instructions (Signed)
Please obtain x-ray.  Take medications as prescribed.  Do not take Medrol with Nabumetone.  Apply topical Aspercreme of [place] to affected area.  Avoid overhead motion or heavy lifting. I will call you with your results.  Will possibly need referral to neurosurgery for further evaluation.

## 2013-11-13 DIAGNOSIS — M67919 Unspecified disorder of synovium and tendon, unspecified shoulder: Secondary | ICD-10-CM | POA: Insufficient documentation

## 2013-11-13 NOTE — Assessment & Plan Note (Signed)
Rx Medrol Dosepak. Rx Flexeril. Avoid heavy lifting or overhead motion of left shoulder. Given handout on exercises. Please call or return to clinic if symptoms aren't improving over the next week, as this will merit need for imaging.

## 2013-11-13 NOTE — Progress Notes (Signed)
Patient ID: [name], male   DOB: [birth year], 38 y.o.   MRN: [id number]  Patient presents to clinic today complaining of left-sided neck pain that radiates into thoracic back that has been present for 2 weeks. Patient denies numbness, weakness or tingling of left upper extremity. Does endorse a "pulling sensation" of his left upper arm with range of motion. Patient denies decrease in range of motion. Denies trauma or injury. Has not taken anything for his symptoms.  Past Medical History  Diagnosis Date  . Allergy   . Asthma   . Depression   . GERD (gastroesophageal reflux disease)   . Hypertension   . Hyperlipidemia   . Chronic pain disorder     Sees [facility]  . Urinary incontinence     detrusor instability  . Hypogonadism   . Diabetes mellitus     Type II    Current Outpatient Prescriptions on File Prior to Visit  Medication Sig Dispense Refill  . azelastine (ASTELIN) 137 MCG/SPRAY nasal spray Place into both nostrils 2 (two) times daily. Use in each nostril as directed      . cetirizine (ZYRTEC) 10 MG tablet Take 10 mg by mouth daily.      . DULoxetine (CYMBALTA) 60 MG capsule Take 1 capsule (60 mg total) by mouth daily.  30 capsule  0  . hydrOXYzine (VISTARIL) 50 MG capsule Take 1 capsule (50 mg total) by mouth daily as needed for anxiety.  30 capsule  0  . insulin aspart (NOVOLOG FLEXPEN) 100 UNIT/ML SOPN FlexPen Inject 10-14 Units into the skin 3 (three) times daily with meals. On a sliding scale      . lamoTRIgine (LAMICTAL) 100 MG tablet Take 1 tab daily  30 tablet  0  . LOVAZA 1 G capsule TAKE 4 CAPSULES BY MOUTH DAILY  120 capsule  2  . metFORMIN (GLUCOPHAGE) 1000 MG tablet TAKE 1 TABLET BY MOUTH TWICE A DAY AS DIRECTED  60 tablet  2  . methocarbamol (ROBAXIN) 750 MG tablet Take 1 tablet (750 mg total) by mouth 3 (three) times daily. For muscle spasms.      . ondansetron (ZOFRAN) 4 MG tablet TAKE 1 TABLET (4 MG TOTAL) BY MOUTH EVERY 8 (EIGHT) HOURS AS  NEEDED FOR NAUSEA.  20 tablet  0  . pioglitazone (ACTOS) 15 MG tablet Take 1 tablet (15 mg total) by mouth daily.  30 tablet  5  . simvastatin (ZOCOR) 20 MG tablet Take 1 tablet (20 mg total) by mouth daily. For hyperlipidemia.  30 tablet  2  . traZODone (DESYREL) 100 MG tablet Take 1 tablet (100 mg total) by mouth at bedtime.  30 tablet  0  . [DISCONTINUED] albuterol (PROVENTIL) 90 MCG/ACT inhaler Inhale 2 puffs into the lungs every 6 (six) hours as needed for wheezing.  17 g  12   No current facility-administered medications on file prior to visit.    Allergies  Allergen Reactions  . Ciprofloxacin Hives    Family History  Problem Relation Age of Onset  . Diabetes Mother   . Hypertension Mother   . Cirrhosis Mother   . Depression Mother   . Bipolar disorder Mother   . Arthritis Father 24    osteoarthritis  . Bipolar disorder Father   . Diabetes Sister     borderline  . Heart disease Paternal Uncle 45    MI  . Heart disease Maternal Grandfather     late 60's--MI  . Cancer Paternal  Grandmother 49    lung  . Bipolar disorder Paternal Grandmother   . Heart disease Paternal Grandfather 68    MI  . Bipolar disorder Paternal Grandfather     History   Social History  . Marital Status: Married    Spouse Name: N/A    Number of Children: 2  . Years of Education: N/A   Occupational History  . Disabled     chronic pain   Social History Main Topics  . Smoking status: Current Every Day Smoker -- 1.50 packs/day    Types: Cigarettes    Last Attempt to Quit: 08/29/2013  . Smokeless tobacco: Never Used  . Alcohol Use: No  . Drug Use: No     Comment: stopped cocaine--2003. Normal drug screen March 2008  . Sexual Activity: Yes    Birth Control/ Protection: None   Other Topics Concern  . None   Social History Narrative  . None   Review of Systems - See HPI.  All other ROS are negative.  Filed Vitals:   11/11/13 1431  BP: 126/82  Pulse: 102  Temp: 98.2 F (36.8 C)   Resp: 16   Physical Exam  Vitals reviewed. Constitutional: He is oriented to person, place, and time and well-developed, well-nourished, and in no distress.  HENT:  Head: Normocephalic and atraumatic.  Eyes: Conjunctivae are normal. Pupils are equal, round, and reactive to light.  Neck: Neck supple.  Cardiovascular: Normal rate, regular rhythm, normal heart sounds and intact distal pulses.   Pulmonary/Chest: Effort normal and breath sounds normal.  Musculoskeletal:       Right shoulder: Normal.       Left shoulder: He exhibits tenderness, pain and spasm. He exhibits normal range of motion, no bony tenderness, no swelling, no effusion, no crepitus and normal strength.       Left elbow: Normal.       Cervical back: He exhibits tenderness, pain and spasm. He exhibits normal range of motion, no bony tenderness, no swelling, no edema and no deformity.       Thoracic back: Normal.       Lumbar back: Normal.       Left upper arm: Normal.  Lymphadenopathy:    He has no cervical adenopathy.  Neurological: He is alert and oriented to person, place, and time. No cranial nerve deficit.  Skin: Skin is warm and dry. No rash noted.  Psychiatric: Affect normal.    Recent Results (from the past 2160 hour(s))  URINALYSIS, ROUTINE W REFLEX MICROSCOPIC     Status: Abnormal   Collection Time    08/28/13  6:32 PM      Result Value Range   Color, Urine YELLOW  YELLOW   APPearance CLOUDY (*) CLEAR   Specific Gravity, Urine 1.011  1.005 - 1.030   pH 6.5  5.0 - 8.0   Glucose, UA NEGATIVE  NEGATIVE mg/dL   Hgb urine dipstick NEGATIVE  NEGATIVE   Bilirubin Urine NEGATIVE  NEGATIVE   Ketones, ur 40 (*) NEGATIVE mg/dL   Protein, ur NEGATIVE  NEGATIVE mg/dL   Urobilinogen, UA 0.2  0.0 - 1.0 mg/dL   Nitrite NEGATIVE  NEGATIVE   Leukocytes, UA NEGATIVE  NEGATIVE   Comment: MICROSCOPIC NOT DONE ON URINES WITH NEGATIVE PROTEIN, BLOOD, LEUKOCYTES, NITRITE, OR GLUCOSE <1000 mg/dL.  URINE RAPID DRUG SCREEN  (HOSP PERFORMED)     Status: None   Collection Time    08/28/13  6:32 PM  Result Value Range   Opiates NONE DETECTED  NONE DETECTED   Cocaine NONE DETECTED  NONE DETECTED   Benzodiazepines NONE DETECTED  NONE DETECTED   Amphetamines NONE DETECTED  NONE DETECTED   Tetrahydrocannabinol NONE DETECTED  NONE DETECTED   Barbiturates NONE DETECTED  NONE DETECTED   Comment:            DRUG SCREEN FOR MEDICAL PURPOSES     ONLY.  IF CONFIRMATION IS NEEDED     FOR ANY PURPOSE, NOTIFY LAB     WITHIN 5 DAYS.                LOWEST DETECTABLE LIMITS     FOR URINE DRUG SCREEN     Drug Class       Cutoff (ng/mL)     Amphetamine      1000     Barbiturate      200     Benzodiazepine   409     Tricyclics       811     Opiates          300     Cocaine          300     THC              50  CBC     Status: Abnormal   Collection Time    08/28/13  6:48 PM      Result Value Range   WBC 10.9 (*) 4.0 - 10.5 K/uL   RBC 5.03  4.22 - 5.81 MIL/uL   Hemoglobin 15.7  13.0 - 17.0 g/dL   HCT 42.4  39.0 - 52.0 %   MCV 84.3  78.0 - 100.0 fL   MCH 31.2  26.0 - 34.0 pg   MCHC 37.0 (*) 30.0 - 36.0 g/dL   RDW 12.2  11.5 - 15.5 %   Platelets 270  150 - 400 K/uL  COMPREHENSIVE METABOLIC PANEL     Status: Abnormal   Collection Time    08/28/13  6:48 PM      Result Value Range   Sodium 126 (*) 135 - 145 mEq/L   Potassium 3.6  3.5 - 5.1 mEq/L   Chloride 89 (*) 96 - 112 mEq/L   CO2 21  19 - 32 mEq/L   Glucose, Bld 153 (*) 70 - 99 mg/dL   BUN 3 (*) 6 - 23 mg/dL   Creatinine, Ser 0.56  0.50 - 1.35 mg/dL   Calcium 9.8  8.4 - 10.5 mg/dL   Total Protein 7.4  6.0 - 8.3 g/dL   Albumin 4.7  3.5 - 5.2 g/dL   AST 25  0 - 37 U/L   ALT 21  0 - 53 U/L   Alkaline Phosphatase 98  39 - 117 U/L   Total Bilirubin 0.4  0.3 - 1.2 mg/dL   GFR calc non Af Amer >90  >90 mL/min   GFR calc Af Amer >90  >90 mL/min   Comment: (NOTE)     The eGFR has been calculated using the CKD EPI equation.     This calculation has not been  validated in all clinical situations.     eGFR's persistently <90 mL/min signify possible Chronic Kidney     Disease.  ETHANOL     Status: None   Collection Time    08/28/13  6:48 PM      Result Value Range   Alcohol, Ethyl (B) <11  0 - 11 mg/dL   Comment:            LOWEST DETECTABLE LIMIT FOR     SERUM ALCOHOL IS 11 mg/dL     FOR MEDICAL PURPOSES ONLY  SALICYLATE LEVEL     Status: Abnormal   Collection Time    08/28/13  6:48 PM      Result Value Range   Salicylate Lvl <0.4 (*) 2.8 - 20.0 mg/dL  VALPROIC ACID LEVEL     Status: Abnormal   Collection Time    08/28/13  6:48 PM      Result Value Range   Valproic Acid Lvl 23.9 (*) 50.0 - 100.0 ug/mL   Comment: Performed at [facility], [facility]     Status: Abnormal   Collection Time    08/28/13  6:48 PM      Result Value Range   Glucose-Capillary 154 (*) 70 - 99 mg/dL   Comment 1 Notify RN    POCT I-STAT TROPONIN I     Status: None   Collection Time    08/28/13  8:29 PM      Result Value Range   Troponin i, poc 0.02  0.00 - 0.08 ng/mL   Comment 3            Comment: Due to the release kinetics of cTnI,     a negative result within the first hours     of the onset of symptoms does not rule out     myocardial infarction with certainty.     If myocardial infarction is still suspected,     repeat the test at appropriate intervals.  GLUCOSE, [facility]     Status: None   Collection Time    08/29/13 12:47 AM      Result Value Range   Glucose-Capillary 84  70 - 99 mg/dL  GLUCOSE, CAPILLARY     Status: None   Collection Time    08/29/13  3:56 AM      Result Value Range   Glucose-Capillary 92  70 - 99 mg/dL   Comment 1 Notify RN     Comment 2 Documented in Chart    GLUCOSE, CAPILL[facility]     Status: None   Collection Time    08/29/13  8:05 AM      Result Value Range   Glucose-Capillary 78  70 - 99 mg/dL  GLUCOSE, CAPILL[facility]     Status: Abnormal   Collection Time    08/29/13 11:22 AM      Result Value Range    Glucose-Capillary 237 (*) 70 - 99 mg/dL  GLUCOSE, CAPILLARY     Status: Abnormal   Collection Time    08/29/13  6:28 PM      Result Value Range   Glucose-Capillary 137 (*) 70 - 99 mg/dL   Comment 1 Documented in Chart     Comment 2 Notify RN    GLUCOSE, CAPILL[facility]     Status: None   Collection Time    08/29/13  9:11 PM      Result Value Range   Glucose-Capillary 95  70 - 99 mg/dL  GLUCOSE, CAPILLARY     Status: Abnormal   Collection Time    08/30/13  6:17 AM      Result Value Range   Glucose-Capillary 155 (*) 70 - 99 mg/dL  POCT I-STAT, CHEM 8     Status: Abnormal   Collection Time    08/30/13  8:08 AM  Result Value Range   Sodium 138  135 - 145 mEq/L   Potassium 3.9  3.5 - 5.1 mEq/L   Chloride 102  96 - 112 mEq/L   BUN <3 (*) 6 - 23 mg/dL   Creatinine, Ser 0.90  0.50 - 1.35 mg/dL   Glucose, Bld 203 (*) 70 - 99 mg/dL   Calcium, Ion 1.22  1.12 - 1.23 mmol/L   TCO2 20  0 - 100 mmol/L   Hemoglobin 13.9  13.0 - 17.0 g/dL   HCT 41.0  39.0 - 52.0 %  GLUCOSE, CAPILL[facility]     Status: Abnormal   Collection Time    08/30/13 11:55 AM      Result Value Range   Glucose-Capillary 262 (*) 70 - 99 mg/dL  GLUCOSE, CAPILLARY     Status: Abnormal   Collection Time    08/30/13  4:59 PM      Result Value Range   Glucose-Capillary 162 (*) 70 - 99 mg/dL  GLUCOSE, CAPILL[facility]     Status: Abnormal   Collection Time    08/30/13  9:05 PM      Result Value Range   Glucose-Capillary 227 (*) 70 - 99 mg/dL  GLUCOSE, CAPILL[facility]     Status: Abnormal   Collection Time    08/31/13  6:32 AM      Result Value Range   Glucose-Capillary 105 (*) 70 - 99 mg/dL   Comment 1 Notify RN    GLUCOSE, CAPILL[facility]     Status: Abnormal   Collection Time    08/31/13 11:33 AM      Result Value Range   Glucose-Capillary 170 (*) 70 - 99 mg/dL  GLUCOSE, CAPILL[facility]     Status: Abnormal   Collection Time    08/31/13  4:49 PM      Result Value Range   Glucose-Capillary 252 (*) 70 - 99 mg/dL  GLUCOSE, CAPILLARY      Status: Abnormal   Collection Time    08/31/13  8:58 PM      Result Value Range   Glucose-Capillary 127 (*) 70 - 99 mg/dL  GLUCOSE, CAPILLARY     Status: Abnormal   Collection Time    09/01/13  4:52 AM      Result Value Range   Glucose-Capillary 141 (*) 70 - 99 mg/dL  GLUCOSE, CAPILLARY     Status: Abnormal   Collection Time    09/01/13  6:31 AM      Result Value Range   Glucose-Capillary 224 (*) 70 - 99 mg/dL  GLUCOSE, CAPILLARY     Status: Abnormal   Collection Time    09/01/13 11:51 AM      Result Value Range   Glucose-Capillary 186 (*) 70 - 99 mg/dL  GLUCOSE, CAPILLARY     Status: Abnormal   Collection Time    09/01/13  4:50 PM      Result Value Range   Glucose-Capillary 194 (*) 70 - 99 mg/dL   Comment 1 Documented in Chart     Comment 2 Notify RN    GLUCOSE, CAPILL[facility]     Status: Abnormal   Collection Time    09/01/13  9:52 PM      Result Value Range   Glucose-Capillary 190 (*) 70 - 99 mg/dL  GLUCOSE, CAPILL[facility]     Status: Abnormal   Collection Time    09/02/13  6:12 AM      Result Value Range   Glucose-Capillary 52 (*) 70 - 99 mg/dL  GLUCOSE, CAPILLARY     Status: Abnormal   Collection Time    09/02/13  6:39 AM      Result Value Range   Glucose-Capillary 155 (*) 70 - 99 mg/dL  GLUCOSE, CAPILL[facility]     Status: Abnormal   Collection Time    09/02/13 12:00 PM      Result Value Range   Glucose-Capillary 124 (*) 70 - 99 mg/dL  GLUCOSE, CAPILLARY     Status: Abnormal   Collection Time    09/02/13  5:14 PM      Result Value Range   Glucose-Capillary 194 (*) 70 - 99 mg/dL  GLUCOSE, CAPILLARY     Status: Abnormal   Collection Time    09/02/13  7:52 PM      Result Value Range   Glucose-Capillary 51 (*) 70 - 99 mg/dL   Comment 1 Notify RN    GLUCOSE, CAPILLARY     Status: None   Collection Time    09/02/13  8:24 PM      Result Value Range   Glucose-Capillary 86  70 - 99 mg/dL  GLUCOSE, CAPILLARY     Status: Abnormal   Collection Time    09/03/13  6:00 AM       Result Value Range   Glucose-Capillary 146 (*) 70 - 99 mg/dL  GLUCOSE, CAPILLARY     Status: Abnormal   Collection Time    09/03/13 11:45 AM      Result Value Range   Glucose-Capillary 215 (*) 70 - 99 mg/dL   Comment 1 Documented in Chart     Comment 2 Notify RN    GLUCOSE, CAPILLARY     Status: Abnormal   Collection Time    09/03/13  5:10 PM      Result Value Range   Glucose-Capillary 233 (*) 70 - 99 mg/dL   Comment 1 Documented in Chart     Comment 2 Notify RN    GLUCOSE, CAPILLARY     Status: None   Collection Time    09/03/13  9:11 PM      Result Value Range   Glucose-Capillary 85  70 - 99 mg/dL  GLUCOSE, CAPILLARY     Status: Abnormal   Collection Time    09/04/13  6:22 AM      Result Value Range   Glucose-Capillary 119 (*) 70 - 99 mg/dL   Comment 1 Notify RN     Comment 2 Documented in Chart    GLUCOSE, CAPILLARY     Status: Abnormal   Collection Time    09/04/13 12:06 PM      Result Value Range   Glucose-Capillary 169 (*) 70 - 99 mg/dL   Comment 1 Notify RN    GLUCOSE, CAPILLARY     Status: Abnormal   Collection Time    09/04/13  5:05 PM      Result Value Range   Glucose-Capillary 183 (*) 70 - 99 mg/dL   Comment 1 Documented in Chart     Comment 2 Notify RN    GLUCOSE, CAPILL[facility]     Status: Abnormal   Collection Time    09/04/13  9:24 PM      Result Value Range   Glucose-Capillary 201 (*) 70 - 99 mg/dL  GLUCOSE, CAPILLARY     Status: Abnormal   Collection Time    09/05/13  6:39 AM      Result Value Range   Glucose-Capillary 213 (*) 70 - 99 mg/dL  GLUCOSE, CAPILLARY     Status: Abnormal   Collection Time    09/05/13 11:58 AM      Result Value Range   Glucose-Capillary 140 (*) 70 - 99 mg/dL  GLUCOSE, CAPILLARY     Status: Abnormal   Collection Time    09/05/13  4:55 PM      Result Value Range   Glucose-Capillary 178 (*) 70 - 99 mg/dL   Comment 1 Documented in Chart     Comment 2 Notify RN    GLUCOSE, CAPILLARY     Status: Abnormal   Collection  Time    09/05/13  9:10 PM      Result Value Range   Glucose-Capillary 145 (*) 70 - 99 mg/dL   Comment 1 Notify RN    GLUCOSE, CAPILLARY     Status: Abnormal   Collection Time    09/06/13  6:23 AM      Result Value Range   Glucose-Capillary 171 (*) 70 - 99 mg/dL   Comment 1 Notify RN    GLUCOSE, CAPILLARY     Status: Abnormal   Collection Time    09/06/13 11:49 AM      Result Value Range   Glucose-Capillary 213 (*) 70 - 99 mg/dL  GLUCOSE, CAPILLARY     Status: Abnormal   Collection Time    09/06/13  4:59 PM      Result Value Range   Glucose-Capillary 254 (*) 70 - 99 mg/dL   Comment 1 Documented in Chart     Comment 2 Notify RN    GLUCOSE, CAPILLARY     Status: Abnormal   Collection Time    09/06/13  8:42 PM      Result Value Range   Glucose-Capillary 127 (*) 70 - 99 mg/dL  GLUCOSE, CAPILLARY     Status: Abnormal   Collection Time    09/07/13  6:15 AM      Result Value Range   Glucose-Capillary 215 (*) 70 - 99 mg/dL  GLUCOSE, CAPILLARY     Status: Abnormal   Collection Time    09/07/13 11:43 AM      Result Value Range   Glucose-Capillary 220 (*) 70 - 99 mg/dL  HEMOGLOBIN A1C     Status: Abnormal   Collection Time    10/03/13  3:52 PM      Result Value Range   Hemoglobin A1C 10.4 (*) <5.7 %   Comment:                                                                            According to the ADA Clinical Practice Recommendations for 2011, when     HbA1c is used as a screening test:             >=6.5%   Diagnostic of Diabetes Mellitus                (if abnormal result is confirmed)           5.7-6.4%   Increased risk of developing Diabetes Mellitus           References:Diagnosis and Classification of Diabetes Mellitus,Diabetes     ZHGD,9242,68(TMHDQ 1):S62-S69 and Standards of Medical Care  in             Diabetes - 2011,Diabetes Care,2011,34 (Suppl 1):S11-S61.         Mean Plasma Glucose 252 (*) <117 mg/dL  BASIC METABOLIC PANEL     Status: Abnormal   Collection  Time    10/03/13  3:52 PM      Result Value Range   Sodium 136  135 - 145 mEq/L   Potassium 4.5  3.5 - 5.3 mEq/L   Chloride 98  96 - 112 mEq/L   CO2 24  19 - 32 mEq/L   Glucose, Bld 339 (*) 70 - 99 mg/dL   BUN 6  6 - 23 mg/dL   Creat 0.95  0.50 - 1.35 mg/dL   Calcium 10.3  8.4 - 10.5 mg/dL  COMPREHENSIVE METABOLIC PANEL     Status: Abnormal   Collection Time    10/21/13  3:46 PM      Result Value Range   Sodium 135  135 - 145 mEq/L   Potassium 4.2  3.5 - 5.1 mEq/L   Chloride 99  96 - 112 mEq/L   CO2 25  19 - 32 mEq/L   Glucose, Bld 317 (*) 70 - 99 mg/dL   BUN 9  6 - 23 mg/dL   Creatinine, Ser 0.9  0.4 - 1.5 mg/dL   Total Bilirubin 0.6  0.3 - 1.2 mg/dL   Alkaline Phosphatase 86  39 - 117 U/L   AST 24  0 - 37 U/L   ALT 38  0 - 53 U/L   Total Protein 7.5  6.0 - 8.3 g/dL   Albumin 4.6  3.5 - 5.2 g/dL   Calcium 9.7  8.4 - 10.5 mg/dL   GFR 102.49  >60.00 mL/min  TESTOSTERONE, FREE, TOTAL     Status: Abnormal   Collection Time    10/21/13  3:46 PM      Result Value Range   Testosterone 250 (*) 300 - 890 ng/dL   Comment:           [facility] Stage       Male              Male                   I              < 30 ng/dL        < 10 ng/dL                   II             < 150 ng/dL       < 30 ng/dL                   III            100-320 ng/dL     < 35 ng/dL                   IV             200-970 ng/dL     15-40 ng/dL                   V/Adult        300-890 ng/dL     10-70 ng/dL         Sex Hormone Binding 40  13 - 71 nmol/L   Testosterone, Free 42.9 (*)  47.0 - 244.0 pg/mL   Comment:       The concentration of free testosterone is derived from a mathematical     expression based on constants for the binding of testosterone to sex     hormone-binding globulin and albumin.   Testosterone-% Free 1.7  1.6 - 2.9 %  T4, FREE     Status: None   Collection Time    10/21/13  3:46 PM      Result Value Range   Free T4 0.76  0.60 - 1.60 ng/dL    Assessment/Plan: No  problem-specific assessment & plan notes found for this encounter.

## 2013-11-15 ENCOUNTER — Telehealth: Payer: Self-pay | Admitting: Physician Assistant

## 2013-11-15 DIAGNOSIS — M67919 Unspecified disorder of synovium and tendon, unspecified shoulder: Secondary | ICD-10-CM

## 2013-11-15 NOTE — Telephone Encounter (Signed)
Message copied by Marcelline MatesMARTIN, Jaylie Neaves on Tue Nov 15, 2013  5:52 PM ------      Message from: Mervin KungFERGERSON, TRICIA A      Created: Tue Nov 15, 2013  5:35 PM       Notified pt's wife. She states pt's symptoms have not improved.  Please advise if further instructions. ------

## 2013-11-15 NOTE — Telephone Encounter (Signed)
Will place referral to Orthopedics for further evaluation and treatment.  If pain is not improving with steroid taper, then I will gladly give him a prescription for a higher-power pain reliever until he can be treated by the specialist.

## 2013-11-16 MED ORDER — MELOXICAM 7.5 MG PO TABS: 7.5000 mg | ORAL_TABLET | Freq: Every day | ORAL | Status: DC

## 2013-11-16 NOTE — Telephone Encounter (Signed)
Spoke with pt. He saw PA on 11/11/13 for neck and left shoulder pain.  He states he took his last prednisone today. Neck pain is better but he states he continues to have left shoulder pain. It feels tight and feels like it is "locked up".  Has been taking cyclobenzaprine.  He is requesting something for the pain.  Please advise.

## 2013-11-16 NOTE — Telephone Encounter (Signed)
Rx sent for meloxicam.  I would recommend that he try to avoid narcotic medications.

## 2013-11-16 NOTE — Telephone Encounter (Signed)
WIFE CALLED TO ASK FOR PAIN MEDS TO BE CALLED IN FOR THE PATIENT.  PLEASE CALL HER TO ADVISE IF THIS CAN BE DONE

## 2013-11-16 NOTE — Telephone Encounter (Signed)
Patient informed, understood & agreed/SLS  

## 2013-11-23 ENCOUNTER — Ambulatory Visit: Payer: Self-pay | Admitting: Endocrinology

## 2013-11-25 ENCOUNTER — Ambulatory Visit (HOSPITAL_COMMUNITY): Payer: Self-pay | Admitting: Psychiatry

## 2013-11-25 ENCOUNTER — Ambulatory Visit: Payer: Self-pay | Admitting: Endocrinology

## 2013-11-26 ENCOUNTER — Other Ambulatory Visit (HOSPITAL_COMMUNITY): Payer: Self-pay | Admitting: Psychiatry

## 2013-11-29 NOTE — Telephone Encounter (Signed)
Chart reviewed, refill not appropriate. Pt not prescribed Risperdal at this time. Note dated 11/09/13 states that it had been tried in past and discontinued due to side effects. Has appt 12/07/13 and can discuss with Dr. Lolly MustacheArfeen at that time.

## 2013-11-30 ENCOUNTER — Other Ambulatory Visit: Payer: Self-pay | Admitting: *Deleted

## 2013-11-30 ENCOUNTER — Encounter: Payer: Self-pay | Admitting: Endocrinology

## 2013-11-30 ENCOUNTER — Ambulatory Visit (INDEPENDENT_AMBULATORY_CARE_PROVIDER_SITE_OTHER): Payer: PRIVATE HEALTH INSURANCE | Admitting: Endocrinology

## 2013-11-30 VITALS — BP 128/80 | HR 128 | Temp 98.6°F | Resp 14 | Ht 73.0 in | Wt 217.4 lb

## 2013-11-30 DIAGNOSIS — E785 Hyperlipidemia, unspecified: Secondary | ICD-10-CM

## 2013-11-30 DIAGNOSIS — E1165 Type 2 diabetes mellitus with hyperglycemia: Principal | ICD-10-CM

## 2013-11-30 DIAGNOSIS — IMO0001 Reserved for inherently not codable concepts without codable children: Secondary | ICD-10-CM

## 2013-11-30 LAB — URINALYSIS, ROUTINE W REFLEX MICROSCOPIC
BILIRUBIN URINE: NEGATIVE
Hgb urine dipstick: NEGATIVE
Leukocytes, UA: NEGATIVE
Nitrite: NEGATIVE
PH: 7.5 (ref 5.0–8.0)
Specific Gravity, Urine: 1.015 (ref 1.000–1.030)
Total Protein, Urine: NEGATIVE
Urine Glucose: 100 — AB
Urobilinogen, UA: 0.2 (ref 0.0–1.0)

## 2013-11-30 LAB — COMPREHENSIVE METABOLIC PANEL
ALK PHOS: 86 U/L (ref 39–117)
ALT: 23 U/L (ref 0–53)
AST: 15 U/L (ref 0–37)
Albumin: 4.6 g/dL (ref 3.5–5.2)
BILIRUBIN TOTAL: 0.4 mg/dL (ref 0.3–1.2)
BUN: 10 mg/dL (ref 6–23)
CO2: 26 mEq/L (ref 19–32)
CREATININE: 0.9 mg/dL (ref 0.4–1.5)
Calcium: 10 mg/dL (ref 8.4–10.5)
Chloride: 103 mEq/L (ref 96–112)
GFR: 96.1 mL/min (ref >60.00)
Glucose, Bld: 95 mg/dL (ref 70–99)
Potassium: 4.2 mEq/L (ref 3.5–5.1)
Sodium: 137 mEq/L (ref 135–145)
Total Protein: 8.5 g/dL — ABNORMAL HIGH (ref 6.0–8.3)

## 2013-11-30 LAB — LIPID PANEL
CHOL/HDL RATIO: 5
Cholesterol: 215 mg/dL — ABNORMAL HIGH (ref 0–200)
HDL: 44.8 mg/dL (ref >39.00)
TRIGLYCERIDES: 207 mg/dL — AB (ref 0.0–149.0)
VLDL: 41.4 mg/dL — AB (ref 0.0–40.0)

## 2013-11-30 LAB — MICROALBUMIN / CREATININE URINE RATIO
Creatinine,U: 189.8 mg/dL
Microalb Creat Ratio: 0.7 mg/g (ref 0.0–30.0)
Microalb, Ur: 1.4 mg/dL (ref 0.0–1.9)

## 2013-11-30 LAB — LDL CHOLESTEROL, DIRECT: Direct LDL: 145.6 mg/dL

## 2013-11-30 MED ORDER — INSULIN ISOPHANE HUMAN 100 UNIT/ML KWIKPEN: 20.0000 [IU] | PEN_INJECTOR | Freq: Every day | SUBCUTANEOUS | Status: DC

## 2013-11-30 NOTE — Patient Instructions (Signed)
Take extra 4-6 units Novolog for larger meals  Please check blood sugars more about 2 hours after any meal and as directed on waking up. Please bring blood sugar monitor to each visit

## 2013-11-30 NOTE — Progress Notes (Signed)
[name]   Reason for Appointment : Followup for Type 2 Diabetes   History of Present Illness          Diagnosis: Type 2 diabetes mellitus, date of diagnosis: 2005     Past history: [name] had previously been treated with oral hypoglycemic agents but required insulin in 2009 when [name] had marked hyperglycemia with ketonuria. Subsequently has been on insulin, continuously since about 2010, usually basal bolus regimen. Also appeared to improve control but using Actoplusmet. However Actos was stopped by his cardiologist because of Edema. In 2011 [name] was taking only about 30 units of Lantus. Subsequently [name] required progressively higher doses of Lantus without adequate overnight glucose control. [name] thinks his A1c had been usually over 9% but no detailed records available However in 09/2013 his A1c was up to 10.4%  Recent history:   On his initial visit here [name] had poor control and was given 20 units of NPH in addition to this at bedtime Lantus because of significantly high fasting readings in the 200+ range. [name] was also instructed on taking at least a minimum amount of NovoLog insulin to cover his meals. Because of his previous history of good response to Actos this was added also [name] has had significant improvement in his blood sugars since 12/14 and his Lantus insulin has been reduced by a total of 30 units now because of tendency to hypoglycemia. Also his NPH has been reduced from 20 down to 15 units to avoid overnight hypoglycemia His blood sugars however were much higher after [name] got steroids earlier this month with readings as high as 556. [name] did take some extra NovoLog but no more than 20 units at a time without much benefit. Fasting readings are also high Recently his fasting readings are excellent except when [name] forgot his bedtime dose once Postprandial Readings have not been checked much except some in the afternoon and these are improving also    INSULIN regimen is described as:  Lantus  40 hs,Novolin 15  5+ sliding scale NovoLog 08-19-13 units with meals Oral hypoglycemic drugs the patient is taking are: Metformin   and Actos       Glucose monitoring:  done  2-3  times a day         Glucometer:   contour    Blood Glucose readings  recently from meter download:  PREMEAL Breakfast Lunch Dinner Bedtime Overall  Glucose range:  112-246    124    244  212    Mean/median:  245     261    POST-MEAL PC Breakfast PC Lunch PC Dinner  Glucose range:    133, 241   82  Mean/median:      Hypoglycemia: None          Self-care: The diet that the patient has been following is: Variable, his wife thinks [name] can do better     Meals: 3 meals per day.  avoiding drinks with sugar, trying to eat some salads Physical activity: exercise: some  walking recently      Dietician visit: Most recent: Unknown           Wt Readings from Last 3 Encounters:  11/30/13 217 lb 6.4 oz (98.612 kg)  11/11/13 222 lb 8 oz (100.925 kg)  11/09/13 225 lb 6.4 oz (102.241 kg)   Retinal exam: Most recent: 2014, normal  Lab Results  Component Value Date   HGBA1C 10.4* 10/03/2013    PROBLEM #2: ?  HYPOGONADISM:  [name] had been treated by his urologist Dr Pete GlatterStoneking for about 2 years for a diagnosis of hypogonadism with  Testopel [name] had not been back for treatment for a year and a half;  in October his prolactin level was high at 43 His total testosterone was minimally low at 330 and LH was 5.1  [name] was taking Risperdal  when [name] was tested for the prolactin [name] complains of feeling tired although is feeling better now  [name] also has lost weight  in 2014 Recent testosterone level is still relatively low; now [name] is taking Lamictal instead of Risperdal  Lab Results  Component Value Date   TESTOSTERONE 250* 10/21/2013       Medication List       This list is accurate as of: 11/30/13 11:59 PM.  Always use your most recent med list.               azelastine 137 MCG/SPRAY nasal spray  Commonly known as:   ASTELIN  Place into both nostrils 2 (two) times daily. Use in each nostril as directed     cetirizine 10 MG tablet  Commonly known as:  ZYRTEC  Take 10 mg by mouth daily.     DULoxetine 60 MG capsule  Commonly known as:  CYMBALTA  Take 1 capsule (60 mg total) by mouth daily.     gabapentin 300 MG capsule  Commonly known as:  NEURONTIN     hydrOXYzine 50 MG capsule  Commonly known as:  VISTARIL  Take 1 capsule (50 mg total) by mouth daily as needed for anxiety.     Insulin Glargine 100 UNIT/ML Solostar Pen  Commonly known as:  LANTUS  Inject 40 Units into the skin at bedtime. Inject 40 units into the skin at bedtime for glycemic control.  Dx 250.02     Insulin Isophane Human 100 UNIT/ML Kiwkpen  Commonly known as:  HUMULIN N  Inject 20 Units into the skin daily.     lamoTRIgine 100 MG tablet  Commonly known as:  LAMICTAL  Take 1 tab daily     LOVAZA 1 G capsule  Generic drug:  omega-3 acid ethyl esters  TAKE 4 CAPSULES BY MOUTH DAILY     metFORMIN 1000 MG tablet  Commonly known as:  GLUCOPHAGE  TAKE 1 TABLET BY MOUTH TWICE A DAY AS DIRECTED     methocarbamol 750 MG tablet  Commonly known as:  ROBAXIN  Take 1 tablet (750 mg total) by mouth 3 (three) times daily. For muscle spasms.     NOVOLOG FLEXPEN 100 UNIT/ML FlexPen  Generic drug:  insulin aspart  Inject 10-14 Units into the skin 3 (three) times daily with meals. On a sliding scale     ondansetron 4 MG tablet  Commonly known as:  ZOFRAN  TAKE 1 TABLET (4 MG TOTAL) BY MOUTH EVERY 8 (EIGHT) HOURS AS NEEDED FOR NAUSEA.     pioglitazone 15 MG tablet  Commonly known as:  ACTOS  Take 1 tablet (15 mg total) by mouth daily.     simvastatin 20 MG tablet  Commonly known as:  ZOCOR  Take 1 tablet (20 mg total) by mouth daily. For hyperlipidemia.     traZODone 100 MG tablet  Commonly known as:  DESYREL  Take 1 tablet (100 mg total) by mouth at bedtime.        Allergies:  Allergies  Allergen Reactions  .  Ciprofloxacin Hives    Past Medical History  Diagnosis Date  . Allergy   . Asthma   . Depression   . GERD (gastroesophageal reflux disease)   . Hypertension   . Hyperlipidemia   . Chronic pain disorder     Sees [facility]  . Urinary incontinence     detrusor instability  . Hypogonadism   . Diabetes mellitus     Type II    Past Surgical History  Procedure Laterality Date  . Appendectomy    . Hernia repair    . Nasal sinus surgery  2008    Family History  Problem Relation Age of Onset  . Diabetes Mother   . Hypertension Mother   . Cirrhosis Mother   . Depression Mother   . Bipolar disorder Mother   . Arthritis Father 49    osteoarthritis  . Bipolar disorder Father   . Diabetes Sister     borderline  . Heart disease Paternal Uncle 50    MI  . Heart disease Maternal Grandfather     late 60's--MI  . Cancer Paternal Grandmother 24    lung  . Bipolar disorder Paternal Grandmother   . Heart disease Paternal Grandfather 56    MI  . Bipolar disorder Paternal Grandfather     Social History:  reports that [name] has been smoking Cigarettes.  [name] has been smoking about 1.50 packs per day. [name] has never used smokeless tobacco. [name] reports that [name] does not drink alcohol or use illicit drugs.    Review of Systems       Lipids: Long-standing history of hyperlipidemia, primarily high triglycerides, no recent results available. On treatment with 20 mg simvastatin    [name] has long-standing depression, [name] had been on Risperdal twice a day but is now on Lamictal; this was started during his recent hospitalization. On Cymbalta for quite some time also as well as Depakote .      Has  Numbness, tingling or burning in  feet  Was on lyrica previously but this was for fibromyalgia   Physical Examination:  BP 128/80  Pulse 128  Temp(Src) 98.6 F (37 C)  Resp 14  Ht 6\' 1"  (1.854 m)  Wt 217 lb 6.4 oz (98.612 kg)  BMI 28.69 kg/m2  SpO2 97%   no ankle  edema  ASSESSMENT:  Diabetes type 2, uncontrolled - 250.02    [name] has had long-standing diabetes  which is now  much better controlled with adding Actos as well as bedtime NPH to his Lantus and NovoLog regimen. No side effects from Actos [name] is taking significantly less doses of Lantus and his fasting readings are getting near target with only 15 NPH at night [name] does have some variability in his blood sugars after meals which may be related to his compliance with diet [name] has lost some weight and is motivated to walk a little His wife thinks that [name] can benefit from consultation with dietitian [name] also has not been able to control his blood sugars when [name] was on steroids earlier this month; appears to have needed much larger doses of insulin at that time  HYPERLIPIDEMIA: 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 His Niaspan was stopped because of possible insulin resistance Triglycerides need to be reassessed  PLAN:   Will continue same insulin regimen but [name] will have to adjust his NovoLog better for larger or higher carbohydrate meals [name] will be scheduled to see the dietitian Continue same doses of basal insulin once and  Actos/metformin   ? Hypogonadism: [name] still has a low testosterone and advised him to followup with urologist for supplementation since [name] will subjectively do better with improved levels and also this may help his depression [name] probably will have continued mild increase in prolactin because of his psychotropic drugs but this should not be affecting his testosterone level   Counseling time over 50% of today's 25 minute visit   Jaylin Benzel 12/01/2013, 9:48 AM    Addendum: LDL above target, needs to be changed from simvastatin to Lipitor 40 mg daily, fructosamine indicates fair recent control overall  Office Visit on 11/30/2013  Component Date Value Range Status  . Sodium 11/30/2013 137  135 - 145 mEq/L Final  . Potassium 11/30/2013 4.2  3.5 -  5.1 mEq/L Final  . Chloride 11/30/2013 103  96 - 112 mEq/L Final  . CO2 11/30/2013 26  19 - 32 mEq/L Final  . Glucose, Bld 11/30/2013 95  70 - 99 mg/dL Final  . BUN 78/29/5621 10  6 - 23 mg/dL Final  . Creatinine, Ser 11/30/2013 0.9  0.4 - 1.5 mg/dL Final  . Total Bilirubin 11/30/2013 0.4  0.3 - 1.2 mg/dL Final  . Alkaline Phosphatase 11/30/2013 86  39 - 117 U/L Final  . AST 11/30/2013 15  0 - 37 U/L Final  . ALT 11/30/2013 23  0 - 53 U/L Final  . Total Protein 11/30/2013 8.5* 6.0 - 8.3 g/dL Final  . Albumin 30/86/5784 4.6  3.5 - 5.2 g/dL Final  . Calcium 69/62/9528 10.0  8.4 - 10.5 mg/dL Final  . GFR 41/32/4401 96.10  >60.00 mL/min Final  . Cholesterol 11/30/2013 215* 0 - 200 mg/dL Final   ATP III Classification       Desirable:  < 200 mg/dL               Borderline High:  200 - 239 mg/dL          High:  > = 027 mg/dL  . Triglycerides 11/30/2013 207.0* 0.0 - 149.0 mg/dL Final   Normal:  <253 mg/dLBorderline High:  150 - 199 mg/dL  . HDL 11/30/2013 44.80  >39.00 mg/dL Final  . VLDL 66/44/0347 41.4* 0.0 - 40.0 mg/dL Final  . Total CHOL/HDL Ratio 11/30/2013 5   Final                  Men          Women1/2 Average Risk     3.4          3.3Average Risk          5.0          4.42X Average Risk          9.6          7.13X Average Risk          15.0          11.0                      . Fructosamine 11/30/2013 335* <285 umol/L Final   Comment:                            Variations in levels of serum proteins (albumin and immunoglobulins)                          may affect fructosamine results.                             Marland Kitchen  Microalb, Ur 11/30/2013 1.4  0.0 - 1.9 mg/dL Final  . Creatinine,U 16/08/9603 189.8   Final  . Microalb Creat Ratio 11/30/2013 0.7  0.0 - 30.0 mg/g Final  . Color, Urine 11/30/2013 YELLOW  Yellow;Lt. Yellow Final  . APPearance 11/30/2013 CLEAR  Clear Final  . Specific Gravity, Urine 11/30/2013 1.015  1.000-1.030 Final  . pH 11/30/2013 7.5  5.0 - 8.0 Final  . Total  Protein, Urine 11/30/2013 NEGATIVE  Negative Final  . Urine Glucose 11/30/2013 100* Negative Final  . Ketones, ur 11/30/2013 TRACE* Negative Final  . Bilirubin Urine 11/30/2013 NEGATIVE  Negative Final  . Hgb urine dipstick 11/30/2013 NEGATIVE  Negative Final  . Urobilinogen, UA 11/30/2013 0.2  0.0 - 1.0 Final  . Leukocytes, UA 11/30/2013 NEGATIVE  Negative Final  . Nitrite 11/30/2013 NEGATIVE  Negative Final  . WBC, UA 11/30/2013 0-2/hpf  0-2/hpf Final  . Mucus, UA 11/30/2013 Presence of* None Final  . Direct LDL 11/30/2013 145.6   Final   Optimal:  <100 mg/dLNear or Above Optimal:  100-129 mg/dLBorderline High:  130-159 mg/dLHigh:  160-189 mg/dLVery High:  >190 mg/dL

## 2013-12-01 ENCOUNTER — Other Ambulatory Visit: Payer: Self-pay | Admitting: *Deleted

## 2013-12-01 LAB — FRUCTOSAMINE: FRUCTOSAMINE: 335 umol/L — AB (ref <285)

## 2013-12-01 MED ORDER — SIMVASTATIN 40 MG PO TABS: 40.0000 mg | ORAL_TABLET | Freq: Every day | ORAL | Status: DC

## 2013-12-02 ENCOUNTER — Ambulatory Visit: Payer: PRIVATE HEALTH INSURANCE | Admitting: Family

## 2013-12-07 ENCOUNTER — Ambulatory Visit (INDEPENDENT_AMBULATORY_CARE_PROVIDER_SITE_OTHER): Payer: 59 | Admitting: Psychiatry

## 2013-12-07 ENCOUNTER — Encounter (HOSPITAL_COMMUNITY): Payer: Self-pay | Admitting: Psychiatry

## 2013-12-07 VITALS — BP 138/80 | HR 88 | Ht 73.0 in | Wt 227.0 lb

## 2013-12-07 DIAGNOSIS — F319 Bipolar disorder, unspecified: Secondary | ICD-10-CM

## 2013-12-07 MED ORDER — LAMOTRIGINE 100 MG PO TABS: ORAL_TABLET | ORAL | Status: DC

## 2013-12-07 MED ORDER — HYDROXYZINE PAMOATE 50 MG PO CAPS: 50.0000 mg | ORAL_CAPSULE | Freq: Every day | ORAL | Status: DC | PRN

## 2013-12-07 MED ORDER — DULOXETINE HCL 60 MG PO CPEP: 60.0000 mg | ORAL_CAPSULE | Freq: Every day | ORAL | Status: DC

## 2013-12-07 MED ORDER — TRAZODONE HCL 100 MG PO TABS: 100.0000 mg | ORAL_TABLET | Freq: Every day | ORAL | Status: DC

## 2013-12-07 NOTE — Progress Notes (Signed)
[facility] [address] Progress Note  [name] [id number] 38 y.o.  12/07/2013 4:54 PM  Chief Complaint:  Medication management and followup.      History of Present Illness:  [name] came for her followup appointment.  On his last visit I have increased his Lamictal to 100 mg.  He is tolerating this medication without any side effects.  He is feeling less irritable, less anxious and less depressed.  He is also taking trazodone, Vistaril, Cymbalta.  He also taking gabapentin, muscle relaxants and recently given hydrocodone by another provider.  The patient denies any blurry vision.  His BuSpar was discontinued on his last visit because of blurry vision.  Patient admitted sleep has been improved.  He denies any rash or itching with Lamictal.  He denies any drinking or using any illegal substances.  Patient has history of using more benzodiazepine and pain medication.  Patient denies any suicidal thoughts, agitation, crying spells or any mania.  Patient denies any tremors or shakes.  He feels his current psychotropic medication is working better.  He denies any nightmares or any flashback.  Suicidal Ideation: No Plan Formed: No Patient has means to carry out plan: No  Homicidal Ideation: No Plan Formed: No Patient has means to carry out plan: No  Past Psychiatric History/Hospitalization(s) Patient has multiple psychiatric hospital admission in his life.  He has hospitalization at  [facility], [facility]  He has history of suicidal thinking and overdose on Klonopin.  He endorsed history of paranoia, anger , substance use , mood swings anger and irritability.  He had tried Depakote, Risperdal, Prozac in recent months but stopped because of the side effects.  He was getting Xanax, Valium and Klonopin.   Anxiety: Yes Bipolar Disorder: Yes Depression: Yes Mania: Yes Psychosis: Yes Schizophrenia: No Personality Disorder: No Hospitalization for  psychiatric illness: Yes History of Electroconvulsive Shock Therapy: No Prior Suicide Attempts: Yes  Medical History; Patient has multiple medical problems.  He has hypertension, diabetes mellitus , asthma, GERD, chronic pain, degenerative disc disease, fibromyalgia, hyperlipidemia and allergic rhinitis.  His primary care physician is Dr. Lendell Caprice and he see Dr. Lucianne Muss for her diabetes.  Review of Systems: Psychiatric: Agitation: No Hallucination: No Depressed Mood: No Insomnia: Yes Hypersomnia: No Altered Concentration: No Feels Worthless: No Grandiose Ideas: No Belief In Special Powers: No New/Increased Substance Abuse: No Compulsions: No  Neurologic: Headache: No Seizure: No Paresthesias: No    Outpatient Encounter Prescriptions as of 12/07/2013  Medication Sig  . DULoxetine (CYMBALTA) 60 MG capsule Take 1 capsule (60 mg total) by mouth daily.  Marland Kitchen gabapentin (NEURONTIN) 300 MG capsule   . hydrOXYzine (VISTARIL) 50 MG capsule Take 1 capsule (50 mg total) by mouth daily as needed for anxiety.  . lamoTRIgine (LAMICTAL) 100 MG tablet Take 1 tab daily  . traZODone (DESYREL) 100 MG tablet Take 1 tablet (100 mg total) by mouth at bedtime.  . [DISCONTINUED] DULoxetine (CYMBALTA) 60 MG capsule Take 1 capsule (60 mg total) by mouth daily.  . [DISCONTINUED] hydrOXYzine (VISTARIL) 50 MG capsule Take 1 capsule (50 mg total) by mouth daily as needed for anxiety.  . [DISCONTINUED] lamoTRIgine (LAMICTAL) 100 MG tablet Take 1 tab daily  . [DISCONTINUED] traZODone (DESYREL) 100 MG tablet Take 1 tablet (100 mg total) by mouth at bedtime.  Marland Kitchen azelastine (ASTELIN) 137 MCG/SPRAY nasal spray Place into both nostrils 2 (two) times daily. Use in each nostril as directed  . cetirizine (ZYRTEC) 10 MG  tablet Take 10 mg by mouth daily.  Marland Kitchen HYDROcodone-acetaminophen (NORCO/VICODIN) 5-325 MG per tablet   . insulin aspart (NOVOLOG FLEXPEN) 100 UNIT/ML SOPN FlexPen Inject 10-14 Units into the skin 3 (three)  times daily with meals. On a sliding scale  . Insulin Glargine (LANTUS) 100 UNIT/ML Solostar Pen Inject 40 Units into the skin at bedtime. Inject 40 units into the skin at bedtime for glycemic control.  Dx 250.02  . Insulin Isophane Human (HUMULIN N) 100 UNIT/ML Kiwkpen Inject 20 Units into the skin daily.  Marland Kitchen LOVAZA 1 G capsule TAKE 4 CAPSULES BY MOUTH DAILY  . metFORMIN (GLUCOPHAGE) 1000 MG tablet TAKE 1 TABLET BY MOUTH TWICE A DAY AS DIRECTED  . methocarbamol (ROBAXIN) 750 MG tablet Take 1 tablet (750 mg total) by mouth 3 (three) times daily. For muscle spasms.  . ondansetron (ZOFRAN) 4 MG tablet TAKE 1 TABLET (4 MG TOTAL) BY MOUTH EVERY 8 (EIGHT) HOURS AS NEEDED FOR NAUSEA.  . pioglitazone (ACTOS) 15 MG tablet Take 1 tablet (15 mg total) by mouth daily.  . simvastatin (ZOCOR) 40 MG tablet Take 1 tablet (40 mg total) by mouth at bedtime.    Recent Results (from the past 2160 hour(s))  HEMOGLOBIN A1C     Status: Abnormal   Collection Time    10/03/13  3:52 PM      Result Value Range   Hemoglobin A1C 10.4 (*) <5.7 %   Comment:                                                                            According to the ADA Clinical Practice Recommendations for 2011, when     HbA1c is used as a screening test:             >=6.5%   Diagnostic of Diabetes Mellitus                (if abnormal result is confirmed)           5.7-6.4%   Increased risk of developing Diabetes Mellitus           References:Diagnosis and Classification of Diabetes Mellitus,Diabetes     Care,2011,34(Suppl 1):S62-S69 and Standards of Medical Care in             Diabetes - 2011,Diabetes Care,2011,34 (Suppl 1):S11-S61.         Mean Plasma Glucose 252 (*) <117 mg/dL  BASIC METABOLIC PANEL     Status: Abnormal   Collection Time    10/03/13  3:52 PM      Result Value Range   Sodium 136  135 - 145 mEq/L   Potassium 4.5  3.5 - 5.3 mEq/L   Chloride 98  96 - 112 mEq/L   CO2 24  19 - 32 mEq/L   Glucose, Bld 339 (*) 70  - 99 mg/dL   BUN 6  6 - 23 mg/dL   Creat 1.30  8.65 - 7.84 mg/dL   Calcium 69.6  8.4 - 29.5 mg/dL  COMPREHENSIVE METABOLIC PANEL     Status: Abnormal   Collection Time    10/21/13  3:46 PM      Result Value Range   Sodium 135  135 -  145 mEq/L   Potassium 4.2  3.5 - 5.1 mEq/L   Chloride 99  96 - 112 mEq/L   CO2 25  19 - 32 mEq/L   Glucose, Bld 317 (*) 70 - 99 mg/dL   BUN 9  6 - 23 mg/dL   Creatinine, Ser 0.9  0.4 - 1.5 mg/dL   Total Bilirubin 0.6  0.3 - 1.2 mg/dL   Alkaline Phosphatase 86  39 - 117 U/L   AST 24  0 - 37 U/L   ALT 38  0 - 53 U/L   Total Protein 7.5  6.0 - 8.3 g/dL   Albumin 4.6  3.5 - 5.2 g/dL   Calcium 9.7  8.4 - 30.810.5 mg/dL   GFR 657.84102.49  >69.62>60.00 mL/min  TESTOSTERONE, FREE, TOTAL     Status: Abnormal   Collection Time    10/21/13  3:46 PM      Result Value Range   Testosterone 250 (*) 300 - 890 ng/dL   Comment:           Tanner Stage       Male              Male                   I              < 30 ng/dL        < 10 ng/dL                   II             < 150 ng/dL       < 30 ng/dL                   III            100-320 ng/dL     < 35 ng/dL                   IV             200-970 ng/dL     95-2815-40 ng/dL                   V/Adult        300-890 ng/dL     41-3210-70 ng/dL         Sex Hormone Binding 40  13 - 71 nmol/L   Testosterone, Free 42.9 (*) 47.0 - 244.0 pg/mL   Comment:       The concentration of free testosterone is derived from a mathematical     expression based on constants for the binding of testosterone to sex     hormone-binding globulin and albumin.   Testosterone-% Free 1.7  1.6 - 2.9 %  T4, FREE     Status: None   Collection Time    10/21/13  3:46 PM      Result Value Range   Free T4 0.76  0.60 - 1.60 ng/dL  COMPREHENSIVE METABOLIC PANEL     Status: Abnormal   Collection Time    11/30/13  3:36 PM      Result Value Range   Sodium 137  135 - 145 mEq/L   Potassium 4.2  3.5 - 5.1 mEq/L   Chloride 103  96 - 112 mEq/L   CO2 26  19 - 32 mEq/L    Glucose, Bld 95  70 - 99 mg/dL  BUN 10  6 - 23 mg/dL   Creatinine, Ser 0.9  0.4 - 1.5 mg/dL   Total Bilirubin 0.4  0.3 - 1.2 mg/dL   Alkaline Phosphatase 86  39 - 117 U/L   AST 15  0 - 37 U/L   ALT 23  0 - 53 U/L   Total Protein 8.5 (*) 6.0 - 8.3 g/dL   Albumin 4.6  3.5 - 5.2 g/dL   Calcium 16.1  8.4 - 09.6 mg/dL   GFR 04.54  >09.81 mL/min  LIPID PANEL     Status: Abnormal   Collection Time    11/30/13  3:36 PM      Result Value Range   Cholesterol 215 (*) 0 - 200 mg/dL   Comment: ATP III Classification       Desirable:  < 200 mg/dL               Borderline High:  200 - 239 mg/dL          High:  > = 191 mg/dL   Triglycerides 478.2 (*) 0.0 - 149.0 mg/dL   Comment: Normal:  <956 mg/dLBorderline High:  150 - 199 mg/dL   HDL 21.30  >86.57 mg/dL   VLDL 84.6 (*) 0.0 - 96.2 mg/dL   Total CHOL/HDL Ratio 5     Comment:                Men          Women1/2 Average Risk     3.4          3.3Average Risk          5.0          4.42X Average Risk          9.6          7.13X Average Risk          15.0          11.0                      FRUCTOSAMINE     Status: Abnormal   Collection Time    11/30/13  3:36 PM      Result Value Range   Fructosamine 335 (*) <285 umol/L   Comment:       Variations in levels of serum proteins (albumin and immunoglobulins)     may affect fructosamine results.        MICROALBUMIN / CREATININE URINE RATIO     Status: None   Collection Time    11/30/13  3:36 PM      Result Value Range   Microalb, Ur 1.4  0.0 - 1.9 mg/dL   Creatinine,U 952.8     Microalb Creat Ratio 0.7  0.0 - 30.0 mg/g  URINALYSIS, ROUTINE W REFLEX MICROSCOPIC     Status: Abnormal   Collection Time    11/30/13  3:36 PM      Result Value Range   Color, Urine YELLOW  Yellow;Lt. Yellow   APPearance CLEAR  Clear   Specific Gravity, Urine 1.015  1.000-1.030   pH 7.5  5.0 - 8.0   Total Protein, Urine NEGATIVE  Negative   Urine Glucose 100 (*) Negative   Ketones, ur TRACE (*) Negative    Bilirubin Urine NEGATIVE  Negative   Hgb urine dipstick NEGATIVE  Negative   Urobilinogen, UA 0.2  0.0 - 1.0   Leukocytes, UA NEGATIVE  Negative   Nitrite NEGATIVE  Negative   WBC, UA 0-2/hpf  0-2/hpf   Mucus, UA Presence of (*) None  LDL CHOLESTEROL, DIRECT     Status: None   Collection Time    11/30/13  3:36 PM      Result Value Range   Direct LDL 145.6     Comment: Optimal:  <100 mg/dLNear or Above Optimal:  100-129 mg/dLBorderline High:  130-159 mg/dLHigh:  160-189 mg/dLVery High:  >190 mg/dL      Physical Exam: Constitutional:  BP 138/80  Pulse 88  Ht 6\' 1"  (1.854 m)  Wt 227 lb (102.967 kg)  BMI 29.96 kg/m2  Musculoskeletal: Strength & Muscle Tone: within normal limits Gait & Station: normal Patient leans: N/A  Mental Status Examination;  Patient is casually dressed and fairly groomed.  He maintains fair eye contact.  His speech is slow but coherent.  He described his mood is neutral and his affect is mood appropriate.  He denies any paranoia or any visual or auditory hallucination.  She denies any active or passive suicidal thoughts or homicidal thoughts.  His attention and concentration is okay. There were no delusion, paranoia or obsession present at this time.  His fund of knowledge is average.  He is alert and oriented x3.  There were no tremors or shakes present.  His insight judgment and impulse control is good.   Medical Decision Making (Choose Three): Established Problem, Stable/Improving (1), New problem, with additional work up planned, Review of Psycho-Social Stressors (1), Review of Last Therapy Session (1) and Review of Medication Regimen & Side Effects (2)  Assessment: Axis I: Bipolar disorder NOS, rule out posttraumatic stress disorder  Axis II: Deferred  Axis III:  Past Medical History  Diagnosis Date  . Allergy   . Asthma   . Depression   . GERD (gastroesophageal reflux disease)   . Hypertension   . Hyperlipidemia   . Chronic pain disorder      Sees [facility]  . Urinary incontinence     detrusor instability  . Hypogonadism   . Diabetes mellitus     Type II    Axis IV: Mild to moderate   Plan:  Patient is doing better on his current medication .  His bipolar disorder is stable.  He is taking Cymbalta which is helping his anxiety and PTSD symptoms.  However I explained given the history of heavy drinking and cocaine abuse , he should not take narcotic pain medication .  He is taking Vicodin, muscle accident in gabapentin .  Talked about polypharmacy , discuss the risk of narcotic abuse, withdrawal and dependency.  Patient acknowledged .  Recommend to cause back if he has any question or any concern.  Followup in 2 months.  I will continue Cymbalta 60 mg daily, Vistaril 50 mg at bedtime for severe anxiety and nervousness , Lamictal 100 mg daily and trazodone 100 mg at bedtime.  Time spent 25 minutes.  More than 50% of the time spent in psychoeducation, counseling and coordination of care.  Discuss safety plan that anytime having active suicidal thoughts or homicidal thoughts then patient need to call 911 or go to the local emergency room.  Priyah Schmuck T., MD 12/07/2013

## 2013-12-15 ENCOUNTER — Other Ambulatory Visit: Payer: Self-pay | Admitting: Family

## 2013-12-16 NOTE — Telephone Encounter (Signed)
Rx request Denied; One-time Rx prescribed after hand fracture in 11.2014/SLS

## 2013-12-19 ENCOUNTER — Other Ambulatory Visit: Payer: Self-pay | Admitting: Orthopedic Surgery

## 2013-12-19 DIAGNOSIS — M19049 Primary osteoarthritis, unspecified hand: Secondary | ICD-10-CM

## 2013-12-26 ENCOUNTER — Ambulatory Visit: Admission: RE | Admit: 2013-12-26 | Payer: Self-pay | Source: Ambulatory Visit

## 2013-12-26 ENCOUNTER — Ambulatory Visit
Admission: RE | Admit: 2013-12-26 | Discharge: 2013-12-26 | Disposition: A | Discharge status: Home / Self Care | Payer: PRIVATE HEALTH INSURANCE | Source: Ambulatory Visit | Attending: Orthopedic Surgery | Admitting: Orthopedic Surgery

## 2013-12-26 DIAGNOSIS — M19049 Primary osteoarthritis, unspecified hand: Secondary | ICD-10-CM

## 2014-01-03 ENCOUNTER — Other Ambulatory Visit: Payer: Self-pay | Admitting: Family

## 2014-01-12 ENCOUNTER — Encounter (HOSPITAL_BASED_OUTPATIENT_CLINIC_OR_DEPARTMENT_OTHER): Payer: Self-pay | Admitting: Emergency Medicine

## 2014-01-12 ENCOUNTER — Emergency Department (HOSPITAL_BASED_OUTPATIENT_CLINIC_OR_DEPARTMENT_OTHER)
Admission: EM | Admit: 2014-01-12 | Discharge: 2014-01-12 | Disposition: A | Discharge status: Home / Self Care | Payer: PRIVATE HEALTH INSURANCE | Attending: Emergency Medicine | Admitting: Emergency Medicine

## 2014-01-12 DIAGNOSIS — F3289 Other specified depressive episodes: Secondary | ICD-10-CM | POA: Insufficient documentation

## 2014-01-12 DIAGNOSIS — E119 Type 2 diabetes mellitus without complications: Secondary | ICD-10-CM | POA: Insufficient documentation

## 2014-01-12 DIAGNOSIS — Z87448 Personal history of other diseases of urinary system: Secondary | ICD-10-CM | POA: Insufficient documentation

## 2014-01-12 DIAGNOSIS — Z8719 Personal history of other diseases of the digestive system: Secondary | ICD-10-CM | POA: Insufficient documentation

## 2014-01-12 DIAGNOSIS — F17200 Nicotine dependence, unspecified, uncomplicated: Secondary | ICD-10-CM | POA: Insufficient documentation

## 2014-01-12 DIAGNOSIS — F329 Major depressive disorder, single episode, unspecified: Secondary | ICD-10-CM | POA: Insufficient documentation

## 2014-01-12 DIAGNOSIS — E785 Hyperlipidemia, unspecified: Secondary | ICD-10-CM | POA: Insufficient documentation

## 2014-01-12 DIAGNOSIS — G8929 Other chronic pain: Secondary | ICD-10-CM | POA: Insufficient documentation

## 2014-01-12 DIAGNOSIS — Z794 Long term (current) use of insulin: Secondary | ICD-10-CM | POA: Insufficient documentation

## 2014-01-12 DIAGNOSIS — M542 Cervicalgia: Secondary | ICD-10-CM

## 2014-01-12 DIAGNOSIS — Z79899 Other long term (current) drug therapy: Secondary | ICD-10-CM | POA: Insufficient documentation

## 2014-01-12 DIAGNOSIS — J45909 Unspecified asthma, uncomplicated: Secondary | ICD-10-CM | POA: Insufficient documentation

## 2014-01-12 DIAGNOSIS — I10 Essential (primary) hypertension: Secondary | ICD-10-CM | POA: Insufficient documentation

## 2014-01-12 HISTORY — DX: Other chronic pain: G89.29

## 2014-01-12 HISTORY — DX: Cervicalgia: M54.2

## 2014-01-12 MED ORDER — OXYCODONE-ACETAMINOPHEN 5-325 MG PO TABS
2.0000 | ORAL_TABLET | Freq: Once | ORAL | Status: AC
  Administered 2014-01-12: 2 via ORAL
  Filled 2014-01-12: qty 2

## 2014-01-12 MED ORDER — OXYCODONE-ACETAMINOPHEN 5-325 MG PO TABS: 1.0000 | ORAL_TABLET | Freq: Four times a day (QID) | ORAL | Status: DC | PRN

## 2014-01-12 NOTE — ED Notes (Signed)
Pt suffers from chronic neck pain and was taken vicodin and suddenly began to allergic to it and was told to stop taking it.

## 2014-01-12 NOTE — ED Provider Notes (Signed)
CSN: [id number]     Arrival date & time 01/12/14  1819 History  This chart was scribed for Richardean Canalavid H Yao, MD by Quintella ReichertMatthew Underwood, ED scribe.  This patient was seen in room MH08/MH08 and the patient's care was started at 6:32 PM.   Chief Complaint  Patient presents with  . Neck Pain    The history is provided by the patient. No language interpreter was used.    HPI Comments: [name] is a 38 y.o. male who presents to the Emergency Department complaining of several days exacerbation of his chronic neck pain due to being out of his pain medications.  Pt reports he has had neck pain for several months and received an MRI which revealed a pinched nerve as the likely cause of his pain.  He is scheduled for an epidural in 6 days.  Pt was taking 10mg  hydrocodone (3-4x/day) for his pain for one month but stopped taking it 3 days ago because over the weekend he developed a pruritic rash and nausea and was advised by his pain management specialist this may have been an allergic reaction.  Since then he has been taking Tylenol without adverse effect, but he reports his pain has not been as well-managed and he needs more pain medications to hold him over until his epidural.  Pt denies any other recent unusual exposures that may have caused his allergic reaction, and notes that he had a similar reaction to hydrocodone after a surgery several years ago.  He is not sure whether he has taken oxycodone in the past.   Past Medical History  Diagnosis Date  . Allergy   . Asthma   . Depression   . GERD (gastroesophageal reflux disease)   . Hypertension   . Hyperlipidemia   . Chronic pain disorder     Sees [facility]  . Urinary incontinence     detrusor instability  . Hypogonadism   . Diabetes mellitus     Type II  . Chronic neck pain     Past Surgical History  Procedure Laterality Date  . Appendectomy    . Hernia repair    . Nasal sinus surgery  2008    Family History  Problem  Relation Age of Onset  . Diabetes Mother   . Hypertension Mother   . Cirrhosis Mother   . Depression Mother   . Bipolar disorder Mother   . Arthritis Father 49    osteoarthritis  . Bipolar disorder Father   . Diabetes Sister     borderline  . Heart disease Paternal Uncle 443    MI  . Heart disease Maternal Grandfather     late 60's--MI  . Cancer Paternal Grandmother [phone]    lung  . Bipolar disorder Paternal Grandmother   . Heart disease Paternal Grandfather [phone]    MI  . Bipolar disorder Paternal Grandfather     History  Substance Use Topics  . Smoking status: Current Every Day Smoker -- 1.50 packs/day    Types: Cigarettes    Last Attempt to Quit: 08/29/2013  . Smokeless tobacco: Never Used  . Alcohol Use: No     Review of Systems  Musculoskeletal: Positive for neck pain.  All other systems reviewed and are negative.      Allergies  Ciprofloxacin and Hydrocodone  Home Medications   Current Outpatient Rx  Name  Route  Sig  Dispense  Refill  . azelastine (ASTELIN) 137 MCG/SPRAY nasal spray   Each  Nare   Place into both nostrils 2 (two) times daily. Use in each nostril as directed         . cetirizine (ZYRTEC) 10 MG tablet   Oral   Take 10 mg by mouth daily.         . DULoxetine (CYMBALTA) 60 MG capsule   Oral   Take 1 capsule (60 mg total) by mouth daily.   30 capsule   1   . gabapentin (NEURONTIN) 300 MG capsule               . HYDROcodone-acetaminophen (NORCO/VICODIN) 5-325 MG per tablet               . hydrOXYzine (VISTARIL) 50 MG capsule   Oral   Take 1 capsule (50 mg total) by mouth daily as needed for anxiety.   30 capsule   0   . insulin aspart (NOVOLOG FLEXPEN) 100 UNIT/ML SOPN FlexPen   Subcutaneous   Inject 10-14 Units into the skin 3 (three) times daily with meals. On a sliding scale         . Insulin Glargine (LANTUS) 100 UNIT/ML Solostar Pen   Subcutaneous   Inject 40 Units into the skin at bedtime. Inject 40 units into  the skin at bedtime for glycemic control.  Dx 250.02         . Insulin Isophane Human (HUMULIN N) 100 UNIT/ML Kiwkpen   Subcutaneous   Inject 20 Units into the skin daily.   5 pen   5   . lamoTRIgine (LAMICTAL) 100 MG tablet      Take 1 tab daily   30 tablet   1   . LOVAZA 1 G capsule      TAKE 4 CAPSULES BY MOUTH DAILY   120 capsule   2   . metFORMIN (GLUCOPHAGE) 1000 MG tablet      TAKE 1 TABLET BY MOUTH TWICE A DAY AS DIRECTED   60 tablet   2   . methocarbamol (ROBAXIN) 750 MG tablet   Oral   Take 1 tablet (750 mg total) by mouth 3 (three) times daily. For muscle spasms.         . ondansetron (ZOFRAN) 4 MG tablet      TAKE 1 TABLET (4 MG TOTAL) BY MOUTH EVERY 8 (EIGHT) HOURS AS NEEDED FOR NAUSEA.   20 tablet   0   . pioglitazone (ACTOS) 15 MG tablet   Oral   Take 1 tablet (15 mg total) by mouth daily.   30 tablet   5   . simvastatin (ZOCOR) 40 MG tablet   Oral   Take 1 tablet (40 mg total) by mouth at bedtime.   30 tablet   5   . traZODone (DESYREL) 100 MG tablet   Oral   Take 1 tablet (100 mg total) by mouth at bedtime.   30 tablet   1    BP 143/94  Pulse 105  Temp(Src) 98.2 F (36.8 C)  Resp 18  Ht 6\' 1"  (1.854 m)  Wt 225 lb (102.059 kg)  BMI 29.69 kg/m2  SpO2 100%  Physical Exam  Nursing note and vitals reviewed. Constitutional: He is oriented to person, place, and time. He appears well-developed and well-nourished. No distress.  HENT:  Head: Normocephalic and atraumatic.  Eyes: EOM are normal.  Neck: Normal range of motion. Neck supple. Muscular tenderness present. No spinous process tenderness present. No tracheal deviation present.  Left paracervical tenderness  Cardiovascular:  Normal rate, regular rhythm and normal heart sounds.   No murmur heard. Pulmonary/Chest: Effort normal and breath sounds normal. No respiratory distress. He has no wheezes. He has no rales.  Abdominal: Soft. There is no tenderness.  Musculoskeletal:   Normal strength upper extremities  Neurological: He is alert and oriented to person, place, and time.  CN 2-12 intact. NL strength and sensation throughout   Skin: Skin is warm and dry.  Psychiatric: He has a normal mood and affect. His behavior is normal.    ED Course  Procedures (including critical care time)  DIAGNOSTIC STUDIES: Oxygen Saturation is 100% on room air, normal by my interpretation.    COORDINATION OF CARE: 6:39 PM-Discussed treatment plan which includes Percocet with pt at bedside and pt agreed to plan.     Labs Review Labs Reviewed - No data to display  Imaging Review No results found.   EKG Interpretation None      MDM   Final diagnoses:  None  [name] is a 38 y.o. male here with worsening chronic neck pain. Neuro exam unremarkable. Given percocet, observed for 30 min, no reactions. Will give short course until he can see his doctor next week.    I personally performed the services described in this documentation, which was scribed in my presence. The recorded information has been reviewed and is accurate.    Richardean Canal, MD 01/12/14 [phone]

## 2014-01-12 NOTE — ED Notes (Signed)
Pt has chronic neck pain and recently developed an allergic reaction to the hydrocodone he was taking and was told to stop taking this for that reason.  Pt states that he is scheduled to get an epidural for the pain Wednesday but needs some pain meds to hold him over until then.

## 2014-01-12 NOTE — Discharge Instructions (Signed)
Take percocet as prescribed for pain. DO NOT drive with it.   Follow up with your doctor to get epidural injections.   Return to ER if you have severe pain, weakness, numbness.

## 2014-01-12 NOTE — ED Notes (Signed)
MD at bedside. 

## 2014-01-16 ENCOUNTER — Encounter (HOSPITAL_BASED_OUTPATIENT_CLINIC_OR_DEPARTMENT_OTHER): Payer: Self-pay | Admitting: Emergency Medicine

## 2014-01-16 ENCOUNTER — Emergency Department (HOSPITAL_BASED_OUTPATIENT_CLINIC_OR_DEPARTMENT_OTHER)
Admission: EM | Admit: 2014-01-16 | Discharge: 2014-01-16 | Disposition: A | Discharge status: Home / Self Care | Payer: PRIVATE HEALTH INSURANCE | Attending: Emergency Medicine | Admitting: Emergency Medicine

## 2014-01-16 DIAGNOSIS — F3289 Other specified depressive episodes: Secondary | ICD-10-CM | POA: Insufficient documentation

## 2014-01-16 DIAGNOSIS — G8929 Other chronic pain: Secondary | ICD-10-CM | POA: Insufficient documentation

## 2014-01-16 DIAGNOSIS — G894 Chronic pain syndrome: Secondary | ICD-10-CM | POA: Insufficient documentation

## 2014-01-16 DIAGNOSIS — F329 Major depressive disorder, single episode, unspecified: Secondary | ICD-10-CM | POA: Insufficient documentation

## 2014-01-16 DIAGNOSIS — E785 Hyperlipidemia, unspecified: Secondary | ICD-10-CM | POA: Insufficient documentation

## 2014-01-16 DIAGNOSIS — E119 Type 2 diabetes mellitus without complications: Secondary | ICD-10-CM | POA: Insufficient documentation

## 2014-01-16 DIAGNOSIS — K219 Gastro-esophageal reflux disease without esophagitis: Secondary | ICD-10-CM | POA: Insufficient documentation

## 2014-01-16 DIAGNOSIS — Z794 Long term (current) use of insulin: Secondary | ICD-10-CM | POA: Insufficient documentation

## 2014-01-16 DIAGNOSIS — M542 Cervicalgia: Secondary | ICD-10-CM | POA: Insufficient documentation

## 2014-01-16 DIAGNOSIS — F17200 Nicotine dependence, unspecified, uncomplicated: Secondary | ICD-10-CM | POA: Insufficient documentation

## 2014-01-16 DIAGNOSIS — Z760 Encounter for issue of repeat prescription: Secondary | ICD-10-CM

## 2014-01-16 DIAGNOSIS — Z79899 Other long term (current) drug therapy: Secondary | ICD-10-CM | POA: Insufficient documentation

## 2014-01-16 MED ORDER — OXYCODONE-ACETAMINOPHEN 5-325 MG PO TABS: 1.0000 | ORAL_TABLET | ORAL | Status: DC | PRN

## 2014-01-16 NOTE — ED Notes (Signed)
Pt reports neck pain.  Pt out of pain medicine.  Pt has epidural scheduled for Wednesday.

## 2014-01-16 NOTE — Discharge Instructions (Signed)
Chronic Back Pain   When back pain lasts longer than 3 months, it is called chronic back pain.People with chronic back pain often go through certain periods that are more intense (flare-ups).   CAUSES  Chronic back pain can be caused by wear and tear (degeneration) on different structures in your back. These structures include:   The bones of your spine (vertebrae) and the joints surrounding your spinal cord and nerve roots (facets).   The strong, fibrous tissues that connect your vertebrae (ligaments).  Degeneration of these structures may result in pressure on your nerves. This can lead to constant pain.  HOME CARE INSTRUCTIONS   Avoid bending, heavy lifting, prolonged sitting, and activities which make the problem worse.   Take brief periods of rest throughout the day to reduce your pain. Lying down or standing usually is better than sitting while you are resting.   Take over-the-counter or prescription medicines only as directed by your caregiver.  SEEK IMMEDIATE MEDICAL CARE IF:    You have weakness or numbness in one of your legs or feet.   You have trouble controlling your bladder or bowels.   You have nausea, vomiting, abdominal pain, shortness of breath, or fainting.  Document Released: 12/04/2004 Document Revised: 01/19/2012 Document Reviewed: 10/11/2011  [employer] Patient Information 2014 [employer]

## 2014-01-16 NOTE — ED Provider Notes (Signed)
This chart was scribed for Timothy Maw Ward, DO by Dorothey Baseman, ED Scribe. This patient was seen in room MH11/MH11 and the patient's care was started at 7:30 PM.  CHIEF COMPLAINT: neck pain  HPI:  HPI Comments: [name] is a 38 y.o. male with a history of chronic pain disorder and chronic neck pain who presents to the Emergency Department complaining of a constant neck pain that he states is chronic in nature for the past 2 months. He denies any initiating injury or trauma to the area. States pain started when he got of bed one morning he felt a pop. Patient denies any recent changes in his pain, but states that he recently ran out of his pain medication (Percocet 5-325 mg that he takes 1-2 tablets every 6 hours for.).  He denies numbness, weakness, bowel or bladder incontinence. No new injury. Patient was seen here on 01/12/2014 (5 days ago) for similar complaints and was discharged with 20 tablets of 5-325 mg of Percocet. Patient states that he has an appointment with an [facility] in 2 days to receive an epi-dural. Patient has an allergy to hydrocodone. Patient also has a history of hyperlipidemia, DM, and hypogonadism.   ROS: See HPI Constitutional: no fever  Eyes: no drainage  ENT: no runny nose   Cardiovascular:  no chest pain  Resp: no SOB  GI: no vomiting GU: no dysuria Integumentary: no rash  Allergy: no hives  Musculoskeletal: neck pain, no leg swelling  Neurological: no slurred speech, no numbness, no weakness, no incontinence ROS otherwise negative  PAST MEDICAL HISTORY/PAST SURGICAL HISTORY:  Past Medical History  Diagnosis Date  . Allergy   . Depression   . GERD (gastroesophageal reflux disease)   . Hyperlipidemia   . Chronic pain disorder     Sees [facility]  . Urinary incontinence     detrusor instability  . Hypogonadism   . Diabetes mellitus     Type II  . Chronic neck pain     MEDICATIONS:  Prior to Admission  medications   Medication Sig Start Date End Date Taking? Authorizing Provider  azelastine (ASTELIN) 137 MCG/SPRAY nasal spray Place into both nostrils 2 (two) times daily. Use in each nostril as directed    Historical Provider, MD  cetirizine (ZYRTEC) 10 MG tablet Take 10 mg by mouth daily.    Historical Provider, MD  DULoxetine (CYMBALTA) 60 MG capsule Take 1 capsule (60 mg total) by mouth daily. 12/07/13   Cleotis Nipper, MD  gabapentin (NEURONTIN) 300 MG capsule  11/29/13   Historical Provider, MD  HYDROcodone-acetaminophen (NORCO/VICODIN) 5-325 MG per tablet  12/06/13   Historical Provider, MD  hydrOXYzine (VISTARIL) 50 MG capsule Take 1 capsule (50 mg total) by mouth daily as needed for anxiety. 12/07/13   Cleotis Nipper, MD  insulin aspart (NOVOLOG FLEXPEN) 100 UNIT/ML SOPN FlexPen Inject 10-14 Units into the skin 3 (three) times daily with meals. On a sliding scale    Historical Provider, MD  Insulin Glargine (LANTUS) 100 UNIT/ML Solostar Pen Inject 40 Units into the skin at bedtime. Inject 40 units into the skin at bedtime for glycemic control.  Dx 250.02 10/05/13   Sandford Craze, NP  Insulin Isophane Human (HUMULIN N) 100 UNIT/ML Kiwkpen Inject 20 Units into the skin daily. 11/30/13   Reather Littler, MD  lamoTRIgine (LAMICTAL) 100 MG tablet Take 1 tab daily 12/07/13   Cleotis Nipper, MD  LOVAZA 1 G capsule  TAKE 4 CAPSULES BY MOUTH DAILY 10/18/13   Sandford CrazeMelissa O'Sullivan, NP  metFORMIN (GLUCOPHAGE) 1000 MG tablet TAKE 1 TABLET BY MOUTH TWICE A DAY AS DIRECTED    Sandford CrazeMelissa O'Sullivan, NP  methocarbamol (ROBAXIN) 750 MG tablet Take 1 tablet (750 mg total) by mouth 3 (three) times daily. For muscle spasms. 07/19/13   Verne SpurrNeil Mashburn, PA-C  ondansetron (ZOFRAN) 4 MG tablet TAKE 1 TABLET (4 MG TOTAL) BY MOUTH EVERY 8 (EIGHT) HOURS AS NEEDED FOR NAUSEA. 09/21/13   Sandford CrazeMelissa O'Sullivan, NP  oxyCODONE-acetaminophen (PERCOCET) 5-325 MG per tablet Take 1-2 tablets by mouth every 6 (six) hours as needed. 01/12/14   Richardean Canalavid H  Yao, MD  pioglitazone (ACTOS) 15 MG tablet Take 1 tablet (15 mg total) by mouth daily. 10/21/13   Reather LittlerAjay Kumar, MD  simvastatin (ZOCOR) 40 MG tablet Take 1 tablet (40 mg total) by mouth at bedtime. 12/01/13   Reather LittlerAjay Kumar, MD  traZODone (DESYREL) 100 MG tablet Take 1 tablet (100 mg total) by mouth at bedtime. 12/07/13   Cleotis NipperSyed T Arfeen, MD    ALLERGIES:  Allergies  Allergen Reactions  . Ciprofloxacin Hives  . Hydrocodone     Rash and nausea    SOCIAL HISTORY:  History  Substance Use Topics  . Smoking status: Current Every Day Smoker -- 1.50 packs/day    Types: Cigarettes    Last Attempt to Quit: 08/29/2013  . Smokeless tobacco: Never Used  . Alcohol Use: No    FAMILY HISTORY: Family History  Problem Relation Age of Onset  . Diabetes Mother   . Hypertension Mother   . Cirrhosis Mother   . Depression Mother   . Bipolar disorder Mother   . Arthritis Father 49    osteoarthritis  . Bipolar disorder Father   . Diabetes Sister     borderline  . Heart disease Paternal Uncle [phone]    MI  . Heart disease Maternal Grandfather     late 60's--MI  . Cancer Paternal Grandmother [phone]    lung  . Bipolar disorder Paternal Grandmother   . Heart disease Paternal Grandfather [phone]    MI  . Bipolar disorder Paternal Grandfather     EXAM: Triage Vitals: BP 137/87  Pulse 102  Temp(Src) 97.9 F (36.6 C) (Oral)  Resp 18  Ht 6\' 1"  (1.854 m)  Wt 225 lb (102.059 kg)  BMI 29.69 kg/m2  SpO2 100%  CONSTITUTIONAL: Alert and oriented and responds appropriately to questions. Well-appearing; well-nourished HEAD: Normocephalic EYES: Conjunctivae clear, PERRL ENT: normal nose; no rhinorrhea; moist mucous membranes; pharynx without lesions noted NECK: Supple, no meningismus, no LAD; lower midline C spine tenderness without step-offs or deformities  CARD: RRR; S1 and S2 appreciated; no murmurs, no clicks, no rubs, no gallops RESP: Normal chest excursion without splinting or tachypnea; breath sounds clear  and equal bilaterally; no wheezes, no rhonchi, no rales,  ABD/GI: Normal bowel sounds; non-distended; soft, non-tender, no rebound, no guarding BACK:  The back appears normal and is non-tender to palpation, there is no CVA tenderness EXT: Normal ROM in all joints; non-tender to palpation; no edema; normal capillary refill; no cyanosis    SKIN: Normal color for age and race; warm NEURO: Moves all extremities equally; 5/5 strength and normal sensation to light touch of bilateral upper and lower extremities; 2+ DTRs of bilateral upper and lower extremities.  PSYCH: The patient's mood and manner are appropriate. Grooming and personal hygiene are appropriate.  MEDICAL DECISION MAKING: Patient with chronic neck pain without initiating  injury or trauma. He denies any recent changes in his pain, but states that he ran out of his pain medication. Will discharge patient with a refill of 5-325 mg Percocet. Patient has orthopaedic follow up in 2 days.   ED PROGRESS:  7:32 PM- Will discharge patient with a refill of his pain medication. Advised patient to follow up with his appointment with his orthopedist. Return precautions given. Discussed treatment plan with patient at bedside and patient verbalized agreement.     I personally performed the services described in this documentation, which was scribed in my presence. The recorded information has been reviewed and is accurate.    Timothy Maw Ward, DO 01/16/14 2001

## 2014-01-17 ENCOUNTER — Other Ambulatory Visit (HOSPITAL_COMMUNITY): Payer: Self-pay | Admitting: Psychiatry

## 2014-01-17 DIAGNOSIS — F319 Bipolar disorder, unspecified: Secondary | ICD-10-CM

## 2014-01-18 ENCOUNTER — Ambulatory Visit: Payer: Self-pay | Admitting: *Deleted

## 2014-01-19 ENCOUNTER — Ambulatory Visit: Payer: Self-pay | Admitting: *Deleted

## 2014-01-19 ENCOUNTER — Ambulatory Visit (HOSPITAL_COMMUNITY): Payer: Self-pay | Admitting: Psychiatry

## 2014-01-21 ENCOUNTER — Emergency Department (HOSPITAL_BASED_OUTPATIENT_CLINIC_OR_DEPARTMENT_OTHER)
Admission: EM | Admit: 2014-01-21 | Discharge: 2014-01-21 | Disposition: A | Discharge status: Home / Self Care | Payer: PRIVATE HEALTH INSURANCE | Attending: Emergency Medicine | Admitting: Emergency Medicine

## 2014-01-21 ENCOUNTER — Encounter (HOSPITAL_BASED_OUTPATIENT_CLINIC_OR_DEPARTMENT_OTHER): Payer: Self-pay | Admitting: Emergency Medicine

## 2014-01-21 DIAGNOSIS — R5381 Other malaise: Secondary | ICD-10-CM | POA: Insufficient documentation

## 2014-01-21 DIAGNOSIS — E785 Hyperlipidemia, unspecified: Secondary | ICD-10-CM | POA: Insufficient documentation

## 2014-01-21 DIAGNOSIS — K219 Gastro-esophageal reflux disease without esophagitis: Secondary | ICD-10-CM | POA: Insufficient documentation

## 2014-01-21 DIAGNOSIS — R5383 Other fatigue: Secondary | ICD-10-CM | POA: Insufficient documentation

## 2014-01-21 DIAGNOSIS — Z87448 Personal history of other diseases of urinary system: Secondary | ICD-10-CM | POA: Insufficient documentation

## 2014-01-21 DIAGNOSIS — F3289 Other specified depressive episodes: Secondary | ICD-10-CM | POA: Insufficient documentation

## 2014-01-21 DIAGNOSIS — F329 Major depressive disorder, single episode, unspecified: Secondary | ICD-10-CM | POA: Insufficient documentation

## 2014-01-21 DIAGNOSIS — Z8639 Personal history of other endocrine, nutritional and metabolic disease: Secondary | ICD-10-CM | POA: Insufficient documentation

## 2014-01-21 DIAGNOSIS — F17200 Nicotine dependence, unspecified, uncomplicated: Secondary | ICD-10-CM | POA: Insufficient documentation

## 2014-01-21 DIAGNOSIS — E119 Type 2 diabetes mellitus without complications: Secondary | ICD-10-CM | POA: Insufficient documentation

## 2014-01-21 DIAGNOSIS — M542 Cervicalgia: Secondary | ICD-10-CM

## 2014-01-21 DIAGNOSIS — G894 Chronic pain syndrome: Secondary | ICD-10-CM | POA: Insufficient documentation

## 2014-01-21 DIAGNOSIS — Z862 Personal history of diseases of the blood and blood-forming organs and certain disorders involving the immune mechanism: Secondary | ICD-10-CM | POA: Insufficient documentation

## 2014-01-21 DIAGNOSIS — G8929 Other chronic pain: Secondary | ICD-10-CM | POA: Insufficient documentation

## 2014-01-21 DIAGNOSIS — Z794 Long term (current) use of insulin: Secondary | ICD-10-CM | POA: Insufficient documentation

## 2014-01-21 DIAGNOSIS — Z79899 Other long term (current) drug therapy: Secondary | ICD-10-CM | POA: Insufficient documentation

## 2014-01-21 MED ORDER — OXYCODONE-ACETAMINOPHEN 5-325 MG PO TABS: 1.0000 | ORAL_TABLET | ORAL | Status: DC | PRN

## 2014-01-21 MED ORDER — HYDROMORPHONE HCL PF 2 MG/ML IJ SOLN
2.0000 mg | Freq: Once | INTRAMUSCULAR | Status: AC
  Administered 2014-01-21: 2 mg via INTRAMUSCULAR
  Filled 2014-01-21: qty 1

## 2014-01-21 NOTE — ED Notes (Signed)
Reports injection to his neck on Wednesday.  Reports pain is radiating down in his left leg now.  Reports continued left arm pain/neck pain.  Denies injury.

## 2014-01-21 NOTE — Discharge Instructions (Signed)
Epidural Steroid Injection An epidural steroid injection is given to relieve pain in your neck, back, or legs that is caused by the irritation or swelling of a nerve root. This procedure involves injecting a steroid and numbing medicine (anesthetic) into the epidural space. The epidural space is the space between the outer covering of your spinal cord and the bones that form your backbone (vertebra).  LET YOUR HEALTH CARE PROVIDER KNOW ABOUT:   Any allergies you have.  All medicines you are taking, including vitamins, herbs, eye drops, creams, and over-the-counter medicines such as aspirin.  Previous problems you or members of your family have had with the use of anesthetics.  Any blood disorders or blood clotting disorders you have.  Previous surgeries you have had.  Medical conditions you have. RISKS AND COMPLICATIONS Generally, this is a safe procedure. However, as with any procedure, complications can occur. Possible complications of epidural steroid injection include:  Headache.  Bleeding.  Infection.  Allergic reaction to the medicines.  Damage to your nerves. The response to this procedure depends on the underlying cause of the pain and its duration. People who have long-term (chronic) pain are less likely to benefit from epidural steroids than are those people whose pain comes on strong and suddenly. BEFORE THE PROCEDURE   Ask your health care provider about changing or stopping your regular medicines. You may be advised to stop taking blood-thinning medicines a few days before the procedure.  You may be given medicines to reduce anxiety.  Arrange for someone to take you home after the procedure. PROCEDURE   You will remain awake during the procedure. You may receive medicine to make you relaxed.  You will be asked to lie on your stomach.  The injection site will be cleaned.  The injection site will be numbed with a medicine (local anesthetic).  A needle will be  injected through your skin into the epidural space.  Your health care provider will use an X-ray machine to ensure that the steroid is delivered closest to the affected nerve. You may have minimal discomfort at this time.  Once the needle is in the right position, the local anesthetic and the steroid will be injected into the epidural space.  The needle will then be removed and a bandage will be applied to the injection site. AFTER THE PROCEDURE   You may be monitored for a short time before you go home.  You may feel weakness or numbness in your arm or leg, which disappears within hours.  You may be allowed to eat, drink, and take your regular medicine.  You may have soreness at the site of the injection. Document Released: 02/03/2008 Document Revised: 06/29/2013 Document Reviewed: 04/15/2013 [employer] Patient Information 2014 [employer].  

## 2014-01-21 NOTE — ED Provider Notes (Signed)
CSN: [id number]     Arrival date & time 01/21/14  1423 History  This chart was scribed for Audree Camel, MD by Elveria Rising, ED scribe.  This patient was seen in room MH01/MH01 and the patient's care was started at 3:40 PM.   Chief Complaint  Patient presents with  . Neck Pain      The history is provided by the patient. No language interpreter was used.   HPI Comments: [name] is a 38 y.o. male who presents to the Emergency Department complaining of chronic neck pain onset yesterday evening. Patient reports pain in his neck that radiates to his lower back and into his left leg. Patient reports weakness in his left leg especially and states that it feels as if the leg will buckle when placing any weight on it. Patient reports that the pain prevented his sleeping last night and currently rates the pain at 7/10. Patient reports that three days ago he received injections in his neck by Dr. Ethelene Hal at [facility]. Patient reports that yesterday evening his pain worsened it feels as if the medicine administered in his injections has worn off. Patient reports that the pain is not as severe as before receiving the injections three days, but he has exhausted his prescribed Oxycodone and was advised by his to visit the ED is his pain did not improve over the weekend. The weakness and radiation of pain are all similar to how he was feeling prior to the injections. Patient denies bowel/bladder incontinence or loss of bowel control.    Past Medical History  Diagnosis Date  . Allergy   . Depression   . GERD (gastroesophageal reflux disease)   . Hyperlipidemia   . Chronic pain disorder     Sees [facility]  . Urinary incontinence     detrusor instability  . Hypogonadism   . Diabetes mellitus     Type II  . Chronic neck pain    Past Surgical History  Procedure Laterality Date  . Appendectomy    . Hernia repair    . Nasal sinus surgery  2008   Family History   Problem Relation Age of Onset  . Diabetes Mother   . Hypertension Mother   . Cirrhosis Mother   . Depression Mother   . Bipolar disorder Mother   . Arthritis Father 49    osteoarthritis  . Bipolar disorder Father   . Diabetes Sister     borderline  . Heart disease Paternal Uncle 75    MI  . Heart disease Maternal Grandfather     late 60's--MI  . Cancer Paternal Grandmother 43    lung  . Bipolar disorder Paternal Grandmother   . Heart disease Paternal Grandfather 71    MI  . Bipolar disorder Paternal Grandfather    History  Substance Use Topics  . Smoking status: Current Every Day Smoker -- 1.50 packs/day    Types: Cigarettes    Last Attempt to Quit: 08/29/2013  . Smokeless tobacco: Never Used  . Alcohol Use: No    Review of Systems  Constitutional: Negative for fever and chills.  Gastrointestinal: Negative for vomiting.  Musculoskeletal: Positive for neck pain.  Neurological: Positive for weakness. Negative for numbness.  All other systems reviewed and are negative.      Allergies  Ciprofloxacin and Hydrocodone  Home Medications   Current Outpatient Rx  Name  Route  Sig  Dispense  Refill  . azelastine (ASTELIN) 137 MCG/SPRAY  nasal spray   Each Nare   Place into both nostrils 2 (two) times daily. Use in each nostril as directed         . cetirizine (ZYRTEC) 10 MG tablet   Oral   Take 10 mg by mouth daily.         . DULoxetine (CYMBALTA) 60 MG capsule   Oral   Take 1 capsule (60 mg total) by mouth daily.   30 capsule   1   . gabapentin (NEURONTIN) 300 MG capsule               . HYDROcodone-acetaminophen (NORCO/VICODIN) 5-325 MG per tablet               . hydrOXYzine (ATARAX/VISTARIL) 50 MG tablet      TAKE 1 TABLET BY MOUTH EVERY DAY AS NEEDED FOR ANXIETY   30 tablet   0   . insulin aspart (NOVOLOG FLEXPEN) 100 UNIT/ML SOPN FlexPen   Subcutaneous   Inject 10-14 Units into the skin 3 (three) times daily with meals. On a sliding  scale         . Insulin Glargine (LANTUS) 100 UNIT/ML Solostar Pen   Subcutaneous   Inject 40 Units into the skin at bedtime. Inject 40 units into the skin at bedtime for glycemic control.  Dx 250.02         . Insulin Isophane Human (HUMULIN N) 100 UNIT/ML Kiwkpen   Subcutaneous   Inject 20 Units into the skin daily.   5 pen   5   . lamoTRIgine (LAMICTAL) 100 MG tablet      Take 1 tab daily   30 tablet   1   . LOVAZA 1 G capsule      TAKE 4 CAPSULES BY MOUTH DAILY   120 capsule   2   . metFORMIN (GLUCOPHAGE) 1000 MG tablet      TAKE 1 TABLET BY MOUTH TWICE A DAY AS DIRECTED   60 tablet   2   . methocarbamol (ROBAXIN) 750 MG tablet   Oral   Take 1 tablet (750 mg total) by mouth 3 (three) times daily. For muscle spasms.         . ondansetron (ZOFRAN) 4 MG tablet      TAKE 1 TABLET (4 MG TOTAL) BY MOUTH EVERY 8 (EIGHT) HOURS AS NEEDED FOR NAUSEA.   20 tablet   0   . oxyCODONE-acetaminophen (PERCOCET) 5-325 MG per tablet   Oral   Take 1-2 tablets by mouth every 6 (six) hours as needed.   20 tablet   0   . oxyCODONE-acetaminophen (PERCOCET/ROXICET) 5-325 MG per tablet   Oral   Take 1 tablet by mouth every 4 (four) hours as needed.   15 tablet   0   . pioglitazone (ACTOS) 15 MG tablet   Oral   Take 1 tablet (15 mg total) by mouth daily.   30 tablet   5   . simvastatin (ZOCOR) 40 MG tablet   Oral   Take 1 tablet (40 mg total) by mouth at bedtime.   30 tablet   5   . traZODone (DESYREL) 100 MG tablet   Oral   Take 1 tablet (100 mg total) by mouth at bedtime.   30 tablet   1    Triage Vitals: BP 150/87  Pulse 122  Temp(Src) 97.5 F (36.4 C) (Oral)  Resp 20  Ht 6\' 1"  (1.854 m)  Wt 225 lb (102.059 kg)  BMI 29.69 kg/m2  SpO2 100% Physical Exam  Nursing note and vitals reviewed. Constitutional: He is oriented to person, place, and time. He appears well-developed and well-nourished. No distress.  HENT:  Head: Normocephalic and atraumatic.   Eyes: EOM are normal.  Neck: Neck supple. No tracheal deviation present.  Cardiovascular: Normal rate.   Pulmonary/Chest: Effort normal. No respiratory distress.  Musculoskeletal: Normal range of motion.  Midline neck tenderness but no erythema, swelling or abscess.   Neurological: He is alert and oriented to person, place, and time. Gait normal. GCS eye subscore is 4. GCS verbal subscore is 5. GCS motor subscore is 6.  Left upper extremity slightly weaker than right upper extremity. Lower extremities equal in strength.   Skin: Skin is warm and dry.  Psychiatric: He has a normal mood and affect. His behavior is normal.    ED Course  Procedures (including critical care time) DIAGNOSTIC STUDIES: Oxygen Saturation is 100% on room air, normal by my interpretation.    COORDINATION OF CARE: 3:48 PM- Will contact [facility]. Pt advised of plan for treatment and pt agrees.    Labs Review Labs Reviewed - No data to display Imaging Review No results found.   EKG Interpretation None      MDM   Final diagnoses:  Neck pain    I discussed the patient with Dr. Merlyn Albert, who given this history and exam feels that no imaging is indicated as he has no new focal neuro signs, no B/B incontinence and no signs of infection. His symptoms are more reflective of his anesthesia wearing off and his pain is back to it was prior to the procedure. Due to this, will give IM pain control and enough po pain meds until he can follow up with Dr. Ethelene Hal on Monday (2days). Discussed strict return precautions with [employer].   I personally performed the services described in this documentation, which was scribed in my presence. The recorded information has been reviewed and is accurate.    Audree Camel, MD 01/22/14 [phone]

## 2014-01-23 ENCOUNTER — Telehealth: Payer: Self-pay | Admitting: Family

## 2014-01-23 NOTE — Telephone Encounter (Signed)
Refill sent on omega 3. Pt last seen by us in 09/2013 and was seen for acute in the office on 11/14/13.  When do you want to see pt back for follow up?

## 2014-01-23 NOTE — Telephone Encounter (Signed)
Pt is due for follow up

## 2014-01-25 NOTE — Telephone Encounter (Signed)
Informed patient of medication refill and he states that he will call back tomorrow to schedule appointment

## 2014-01-27 ENCOUNTER — Other Ambulatory Visit: Payer: Self-pay

## 2014-01-27 ENCOUNTER — Ambulatory Visit: Payer: Self-pay | Admitting: Endocrinology

## 2014-01-30 ENCOUNTER — Ambulatory Visit (HOSPITAL_COMMUNITY): Payer: Self-pay | Admitting: Psychiatry

## 2014-01-31 ENCOUNTER — Encounter: Payer: Self-pay | Admitting: Family

## 2014-01-31 ENCOUNTER — Ambulatory Visit (INDEPENDENT_AMBULATORY_CARE_PROVIDER_SITE_OTHER): Payer: PRIVATE HEALTH INSURANCE | Admitting: Family

## 2014-01-31 VITALS — BP 140/88 | HR 88 | Temp 97.7°F | Resp 16 | Ht 72.0 in | Wt 222.1 lb

## 2014-01-31 DIAGNOSIS — M542 Cervicalgia: Secondary | ICD-10-CM

## 2014-01-31 MED ORDER — MELOXICAM 7.5 MG PO TABS: 7.5000 mg | ORAL_TABLET | Freq: Every day | ORAL | Status: DC

## 2014-01-31 MED ORDER — CYCLOBENZAPRINE HCL 5 MG PO TABS: 5.0000 mg | ORAL_TABLET | Freq: Three times a day (TID) | ORAL | Status: DC | PRN

## 2014-01-31 NOTE — Progress Notes (Signed)
Pre visit review using our clinic review tool, if applicable. No additional management support is needed unless otherwise documented below in the visit note. 

## 2014-01-31 NOTE — Progress Notes (Signed)
Subjective:    Patient ID: [name]., male    DOB: 1976/06/04, 38 y.o.   MRN: [id number]  HPI  [name] is a 38 yr old male with known hx of substance abuse, who presents today with chief complaint of neck pain. Last MRI in our system in 2012 noted mild cervical spinal stenosis from C3-C4 to C6- C7 and C4-C5.He reports pain radiates down the left arm. He is seeing Dr. Ethelene Hal currently. He reports that 2 weeks ago he had spinal injections, and then 1 week ago he had trigger point injections. The patient notes that he has not had significant relief of pain following these treatments. He has been using oxycodone.  He has received a total of 100 tabs of oxycodone since 3/5 from multiple providers upon review of the controlled substance registry. Most recently he was given 45 tabs on 3/17 which was to be a 15 day supply.  He tells me that he has been using 2 tabs every 4 hours at the recommendation of someone t Dr. Dairl Ponder office and thus he is out. Tells me that Dr. Ethelene Hal' office is not returning his calls in regards to refills and he is requesting that I refill his oxycodone today.  Reports that Dr. Ethelene Hal has made a referral to Dr. Shon Baton- Neurosurgery.    Review of Systems    see HPI  Past Medical History  Diagnosis Date  . Allergy   . Depression   . GERD (gastroesophageal reflux disease)   . Hyperlipidemia   . Chronic pain disorder     Sees [facility]  . Urinary incontinence     detrusor instability  . Hypogonadism   . Diabetes mellitus     Type II  . Chronic neck pain     History   Social History  . Marital Status: Married    Spouse Name: N/A    Number of Children: 2  . Years of Education: N/A   Occupational History  . Disabled     chronic pain   Social History Main Topics  . Smoking status: Current Every Day Smoker -- 1.50 packs/day    Types: Cigarettes    Last Attempt to Quit: 08/29/2013  . Smokeless tobacco: Never Used  . Alcohol Use: No    . Drug Use: No     Comment: stopped cocaine--2003. Normal drug screen March 2008  . Sexual Activity: Yes    Birth Control/ Protection: None   Other Topics Concern  . Not on file   Social History Narrative  . No narrative on file    Past Surgical History  Procedure Laterality Date  . Appendectomy    . Hernia repair    . Nasal sinus surgery  2008    Family History  Problem Relation Age of Onset  . Diabetes Mother   . Hypertension Mother   . Cirrhosis Mother   . Depression Mother   . Bipolar disorder Mother   . Arthritis Father 49    osteoarthritis  . Bipolar disorder Father   . Diabetes Sister     borderline  . Heart disease Paternal Uncle 31    MI  . Heart disease Maternal Grandfather     late 60's--MI  . Cancer Paternal Grandmother 38    lung  . Bipolar disorder Paternal Grandmother   . Heart disease Paternal Grandfather 3    MI  . Bipolar disorder Paternal Grandfather     Allergies  Allergen Reactions  .  Ciprofloxacin Hives  . Hydrocodone     Rash and nausea    Current Outpatient Prescriptions on File Prior to Visit  Medication Sig Dispense Refill  . azelastine (ASTELIN) 137 MCG/SPRAY nasal spray Place into both nostrils 2 (two) times daily. Use in each nostril as directed      . cetirizine (ZYRTEC) 10 MG tablet Take 10 mg by mouth daily.      . DULoxetine (CYMBALTA) 60 MG capsule Take 1 capsule (60 mg total) by mouth daily.  30 capsule  1  . gabapentin (NEURONTIN) 300 MG capsule       . hydrOXYzine (ATARAX/VISTARIL) 50 MG tablet TAKE 1 TABLET BY MOUTH EVERY DAY AS NEEDED FOR ANXIETY  30 tablet  0  . insulin aspart (NOVOLOG FLEXPEN) 100 UNIT/ML SOPN FlexPen Inject 10-14 Units into the skin 3 (three) times daily with meals. On a sliding scale      . Insulin Glargine (LANTUS) 100 UNIT/ML Solostar Pen Inject 40 Units into the skin at bedtime. Inject 40 units into the skin at bedtime for glycemic control.  Dx 250.02      . Insulin Isophane Human (HUMULIN N)  100 UNIT/ML Kiwkpen Inject 20 Units into the skin daily.  5 pen  5  . lamoTRIgine (LAMICTAL) 100 MG tablet Take 1 tab daily  30 tablet  1  . metFORMIN (GLUCOPHAGE) 1000 MG tablet TAKE 1 TABLET BY MOUTH TWICE A DAY AS DIRECTED  60 tablet  2  . omega-3 acid ethyl esters (LOVAZA) 1 G capsule TAKE 4 CAPSULES BY MOUTH DAILY  120 capsule  2  . ondansetron (ZOFRAN) 4 MG tablet TAKE 1 TABLET (4 MG TOTAL) BY MOUTH EVERY 8 (EIGHT) HOURS AS NEEDED FOR NAUSEA.  20 tablet  0  . oxyCODONE-acetaminophen (PERCOCET) 5-325 MG per tablet Take 1-2 tablets by mouth every 4 (four) hours as needed.  20 tablet  0  . pioglitazone (ACTOS) 15 MG tablet Take 1 tablet (15 mg total) by mouth daily.  30 tablet  5  . simvastatin (ZOCOR) 40 MG tablet Take 1 tablet (40 mg total) by mouth at bedtime.  30 tablet  5  . traZODone (DESYREL) 100 MG tablet Take 1 tablet (100 mg total) by mouth at bedtime.  30 tablet  1  . [DISCONTINUED] albuterol (PROVENTIL) 90 MCG/ACT inhaler Inhale 2 puffs into the lungs every 6 (six) hours as needed for wheezing.  17 g  12   No current facility-administered medications on file prior to visit.    BP 140/88  Pulse 88  Temp(Src) 97.7 F (36.5 C) (Oral)  Resp 16  Ht 6' (1.829 m)  Wt 222 lb 1.3 oz (100.735 kg)  BMI 30.11 kg/m2  SpO2 99%    Objective:   Physical Exam  Constitutional: He is oriented to person, place, and time. He appears well-developed and well-nourished. No distress.  Cardiovascular: Normal rate and regular rhythm.   No murmur heard. Pulmonary/Chest: Effort normal and breath sounds normal. No respiratory distress. He has no wheezes. He has no rales. He exhibits no tenderness.  Neurological: He is alert and oriented to person, place, and time.  Bilateral hand grasps 5/5, bilateral UE strength is 5/5  Psychiatric: He is agitated.          Assessment & Plan:

## 2014-01-31 NOTE — Assessment & Plan Note (Signed)
Advised pt that I am not comfortable refilling his narcotics as he has sought 100 tabs from multiple providers in the last 21 days.  I advised him to contact Dr. Ethelene Halamos' office for refills but advised him that the reason they are not refilling his rx at this time is because it is too soon.  I have sent rx for prn flexeril and meloxicam to his pharmacy in the meantime. He became very angry/aggitated and walked out of the exam room.

## 2014-01-31 NOTE — Patient Instructions (Addendum)
Please follow up with Neurosurgery as instructed by orthopedics. For pain, you can add meloxicam and flexeril prn. Follow up in 2 months.

## 2014-02-01 ENCOUNTER — Telehealth: Payer: Self-pay | Admitting: Family

## 2014-02-01 NOTE — Telephone Encounter (Signed)
Relevant patient education assigned to patient using Emmi. ° °

## 2014-02-08 ENCOUNTER — Other Ambulatory Visit (HOSPITAL_COMMUNITY): Payer: Self-pay | Admitting: Psychiatry

## 2014-02-08 DIAGNOSIS — F319 Bipolar disorder, unspecified: Secondary | ICD-10-CM

## 2014-02-09 ENCOUNTER — Encounter: Payer: Self-pay | Admitting: Endocrinology

## 2014-02-09 ENCOUNTER — Other Ambulatory Visit: Payer: Self-pay

## 2014-02-09 ENCOUNTER — Encounter: Payer: Self-pay | Admitting: *Deleted

## 2014-02-15 ENCOUNTER — Other Ambulatory Visit: Payer: Self-pay | Admitting: Family

## 2014-02-16 NOTE — Telephone Encounter (Signed)
Refill request for meloxicam  Last filled by MD on - 01/31/2014 #14 x0 Last Appt: 01/31/2014 Next Appt: none Please advise refill?

## 2014-03-02 ENCOUNTER — Ambulatory Visit: Payer: Self-pay | Admitting: *Deleted

## 2014-03-05 ENCOUNTER — Other Ambulatory Visit (HOSPITAL_COMMUNITY): Payer: Self-pay | Admitting: Psychiatry

## 2014-03-05 DIAGNOSIS — F319 Bipolar disorder, unspecified: Secondary | ICD-10-CM

## 2014-03-07 NOTE — Telephone Encounter (Signed)
Requested pharmacy attach to RX: Needs to make appointment for follow up. May not receive future refills without appointment

## 2014-03-09 ENCOUNTER — Ambulatory Visit (HOSPITAL_COMMUNITY): Payer: Self-pay | Admitting: Psychiatry

## 2014-03-13 ENCOUNTER — Other Ambulatory Visit (HOSPITAL_COMMUNITY): Payer: Self-pay | Admitting: Psychiatry

## 2014-03-13 ENCOUNTER — Ambulatory Visit: Payer: Self-pay | Admitting: *Deleted

## 2014-03-17 ENCOUNTER — Telehealth (HOSPITAL_COMMUNITY): Payer: Self-pay

## 2014-03-17 ENCOUNTER — Other Ambulatory Visit (HOSPITAL_COMMUNITY): Payer: Self-pay | Admitting: Psychiatry

## 2014-03-17 NOTE — Telephone Encounter (Signed)
Patient needs appointment. Not seen since January. Will provide 30 days supply.

## 2014-03-19 ENCOUNTER — Encounter (HOSPITAL_COMMUNITY): Payer: Self-pay | Admitting: Emergency Medicine

## 2014-03-19 ENCOUNTER — Emergency Department (HOSPITAL_COMMUNITY)
Admission: EM | Admit: 2014-03-19 | Discharge: 2014-03-20 | Disposition: A | Discharge status: Other Acute Inpatient Hospital | Payer: PRIVATE HEALTH INSURANCE | Attending: Emergency Medicine | Admitting: Emergency Medicine

## 2014-03-19 DIAGNOSIS — G8929 Other chronic pain: Secondary | ICD-10-CM | POA: Insufficient documentation

## 2014-03-19 DIAGNOSIS — E785 Hyperlipidemia, unspecified: Secondary | ICD-10-CM | POA: Insufficient documentation

## 2014-03-19 DIAGNOSIS — F1410 Cocaine abuse, uncomplicated: Secondary | ICD-10-CM | POA: Insufficient documentation

## 2014-03-19 DIAGNOSIS — G894 Chronic pain syndrome: Secondary | ICD-10-CM | POA: Insufficient documentation

## 2014-03-19 DIAGNOSIS — F17200 Nicotine dependence, unspecified, uncomplicated: Secondary | ICD-10-CM | POA: Insufficient documentation

## 2014-03-19 DIAGNOSIS — S79919A Unspecified injury of unspecified hip, initial encounter: Secondary | ICD-10-CM | POA: Insufficient documentation

## 2014-03-19 DIAGNOSIS — F3289 Other specified depressive episodes: Secondary | ICD-10-CM | POA: Insufficient documentation

## 2014-03-19 DIAGNOSIS — Z791 Long term (current) use of non-steroidal anti-inflammatories (NSAID): Secondary | ICD-10-CM | POA: Insufficient documentation

## 2014-03-19 DIAGNOSIS — R45850 Homicidal ideations: Secondary | ICD-10-CM | POA: Insufficient documentation

## 2014-03-19 DIAGNOSIS — F329 Major depressive disorder, single episode, unspecified: Secondary | ICD-10-CM | POA: Insufficient documentation

## 2014-03-19 DIAGNOSIS — K219 Gastro-esophageal reflux disease without esophagitis: Secondary | ICD-10-CM | POA: Insufficient documentation

## 2014-03-19 DIAGNOSIS — F29 Unspecified psychosis not due to a substance or known physiological condition: Secondary | ICD-10-CM | POA: Insufficient documentation

## 2014-03-19 DIAGNOSIS — Z794 Long term (current) use of insulin: Secondary | ICD-10-CM | POA: Insufficient documentation

## 2014-03-19 DIAGNOSIS — F1010 Alcohol abuse, uncomplicated: Secondary | ICD-10-CM | POA: Insufficient documentation

## 2014-03-19 DIAGNOSIS — F419 Anxiety disorder, unspecified: Secondary | ICD-10-CM | POA: Diagnosis present

## 2014-03-19 DIAGNOSIS — S79929A Unspecified injury of unspecified thigh, initial encounter: Secondary | ICD-10-CM

## 2014-03-19 DIAGNOSIS — F332 Major depressive disorder, recurrent severe without psychotic features: Secondary | ICD-10-CM | POA: Diagnosis present

## 2014-03-19 DIAGNOSIS — F23 Brief psychotic disorder: Secondary | ICD-10-CM

## 2014-03-19 DIAGNOSIS — IMO0002 Reserved for concepts with insufficient information to code with codable children: Secondary | ICD-10-CM | POA: Insufficient documentation

## 2014-03-19 DIAGNOSIS — Z79899 Other long term (current) drug therapy: Secondary | ICD-10-CM | POA: Insufficient documentation

## 2014-03-19 DIAGNOSIS — R443 Hallucinations, unspecified: Secondary | ICD-10-CM | POA: Insufficient documentation

## 2014-03-19 DIAGNOSIS — F22 Delusional disorders: Secondary | ICD-10-CM | POA: Insufficient documentation

## 2014-03-19 DIAGNOSIS — E119 Type 2 diabetes mellitus without complications: Secondary | ICD-10-CM | POA: Insufficient documentation

## 2014-03-19 LAB — CBC WITH DIFFERENTIAL/PLATELET
BASOS ABS: 0 10*3/uL (ref 0.0–0.1)
BASOS PCT: 0 % (ref 0–1)
EOS ABS: 0 10*3/uL (ref 0.0–0.7)
EOS PCT: 0 % (ref 0–5)
HEMATOCRIT: 36.2 % — AB (ref 39.0–52.0)
HEMOGLOBIN: 12.8 g/dL — AB (ref 13.0–17.0)
Lymphocytes Relative: 21 % (ref 12–46)
Lymphs Abs: 2.9 10*3/uL (ref 0.7–4.0)
MCH: 31.1 pg (ref 26.0–34.0)
MCHC: 35.4 g/dL (ref 30.0–36.0)
MCV: 87.9 fL (ref 78.0–100.0)
MONO ABS: 1 10*3/uL (ref 0.1–1.0)
MONOS PCT: 7 % (ref 3–12)
Neutro Abs: 9.8 10*3/uL — ABNORMAL HIGH (ref 1.7–7.7)
Neutrophils Relative %: 71 % (ref 43–77)
Platelets: 293 10*3/uL (ref 150–400)
RBC: 4.12 MIL/uL — ABNORMAL LOW (ref 4.22–5.81)
RDW: 13.4 % (ref 11.5–15.5)
WBC: 13.8 10*3/uL — ABNORMAL HIGH (ref 4.0–10.5)

## 2014-03-19 LAB — I-STAT CHEM 8, ED
BUN: <3 mg/dL — ABNORMAL LOW (ref 6–23)
CALCIUM ION: 1.11 mmol/L — AB (ref 1.12–1.23)
CHLORIDE: 96 meq/L (ref 96–112)
Creatinine, Ser: 0.9 mg/dL (ref 0.50–1.35)
GLUCOSE: 203 mg/dL — AB (ref 70–99)
HEMATOCRIT: 42 % (ref 39.0–52.0)
HEMOGLOBIN: 14.3 g/dL (ref 13.0–17.0)
Potassium: 4 mEq/L (ref 3.7–5.3)
Sodium: 130 mEq/L — ABNORMAL LOW (ref 137–147)
TCO2: 14 mmol/L (ref 0–100)

## 2014-03-19 LAB — SALICYLATE LEVEL

## 2014-03-19 LAB — CBG MONITORING, ED
GLUCOSE-CAPILLARY: 185 mg/dL — AB (ref 70–99)
Glucose-Capillary: 97 mg/dL (ref 70–99)
Glucose-Capillary: 139 mg/dL — ABNORMAL HIGH (ref 70–99)

## 2014-03-19 LAB — RAPID URINE DRUG SCREEN, HOSP PERFORMED
AMPHETAMINES: NOT DETECTED
Barbiturates: NOT DETECTED
Benzodiazepines: NOT DETECTED
Cocaine: POSITIVE — AB
OPIATES: NOT DETECTED
Tetrahydrocannabinol: NOT DETECTED

## 2014-03-19 LAB — ACETAMINOPHEN LEVEL: Acetaminophen (Tylenol), Serum: <15 ug/mL (ref 10–30)

## 2014-03-19 LAB — ETHANOL: ALCOHOL ETHYL (B): 149 mg/dL — AB (ref 0–11)

## 2014-03-19 MED ORDER — IBUPROFEN 200 MG PO TABS
600.0000 mg | ORAL_TABLET | Freq: Three times a day (TID) | ORAL | Status: DC | PRN
  Administered 2014-03-19 – 2014-03-20 (×3): 600 mg via ORAL
  Filled 2014-03-19 (×3): qty 3

## 2014-03-19 MED ORDER — LORAZEPAM 1 MG PO TABS: 1.0000 mg | ORAL_TABLET | Freq: Three times a day (TID) | ORAL | Status: DC | PRN

## 2014-03-19 MED ORDER — ZIPRASIDONE MESYLATE 20 MG IM SOLR: 20.0000 mg | Freq: Once | INTRAMUSCULAR | Status: DC

## 2014-03-19 MED ORDER — LORAZEPAM 2 MG/ML IJ SOLN
1.0000 mg | Freq: Once | INTRAMUSCULAR | Status: AC
  Administered 2014-03-19: 1 mg via INTRAVENOUS
  Filled 2014-03-19: qty 1

## 2014-03-19 MED ORDER — STERILE WATER FOR INJECTION IJ SOLN: 5.0000 mL | Freq: Once | INTRAMUSCULAR | Status: DC

## 2014-03-19 MED ORDER — INSULIN ASPART 100 UNIT/ML FLEXPEN: 10.0000 [IU] | PEN_INJECTOR | Freq: Three times a day (TID) | SUBCUTANEOUS | Status: DC

## 2014-03-19 MED ORDER — METFORMIN HCL 500 MG PO TABS
1000.0000 mg | ORAL_TABLET | Freq: Every day | ORAL | Status: DC
  Administered 2014-03-20: 1000 mg via ORAL
  Filled 2014-03-19 (×2): qty 2

## 2014-03-19 MED ORDER — OMEGA-3-ACID ETHYL ESTERS 1 G PO CAPS
1.0000 g | ORAL_CAPSULE | Freq: Every day | ORAL | Status: DC
  Administered 2014-03-19 – 2014-03-20 (×2): 1 g via ORAL
  Filled 2014-03-19 (×2): qty 1

## 2014-03-19 MED ORDER — ZOLPIDEM TARTRATE 5 MG PO TABS
5.0000 mg | ORAL_TABLET | Freq: Every evening | ORAL | Status: DC | PRN
  Administered 2014-03-19: 5 mg via ORAL
  Filled 2014-03-19: qty 1

## 2014-03-19 MED ORDER — INSULIN GLARGINE 100 UNIT/ML ~~LOC~~ SOLN
40.0000 [IU] | Freq: Every day | SUBCUTANEOUS | Status: DC
  Filled 2014-03-19: qty 0.4

## 2014-03-19 MED ORDER — INSULIN GLARGINE 100 UNIT/ML ~~LOC~~ SOLN
30.0000 [IU] | Freq: Every day | SUBCUTANEOUS | Status: DC
  Filled 2014-03-19 (×2): qty 0.3

## 2014-03-19 MED ORDER — ONDANSETRON HCL 4 MG PO TABS: 4.0000 mg | ORAL_TABLET | Freq: Three times a day (TID) | ORAL | Status: DC | PRN

## 2014-03-19 MED ORDER — ALUM & MAG HYDROXIDE-SIMETH 200-200-20 MG/5ML PO SUSP: 30.0000 mL | ORAL | Status: DC | PRN

## 2014-03-19 MED ORDER — INSULIN ASPART 100 UNIT/ML ~~LOC~~ SOLN: 0.0000 [IU] | Freq: Three times a day (TID) | SUBCUTANEOUS | Status: DC

## 2014-03-19 MED ORDER — NICOTINE 21 MG/24HR TD PT24
21.0000 mg | MEDICATED_PATCH | Freq: Every day | TRANSDERMAL | Status: DC
  Administered 2014-03-19 – 2014-03-20 (×2): 21 mg via TRANSDERMAL
  Filled 2014-03-19 (×2): qty 1

## 2014-03-19 MED ORDER — SIMVASTATIN 40 MG PO TABS
40.0000 mg | ORAL_TABLET | Freq: Every day | ORAL | Status: DC
  Administered 2014-03-19: 40 mg via ORAL
  Filled 2014-03-19 (×2): qty 1

## 2014-03-19 MED ORDER — MELOXICAM 7.5 MG PO TABS
7.5000 mg | ORAL_TABLET | Freq: Every day | ORAL | Status: DC
  Administered 2014-03-19 – 2014-03-20 (×2): 7.5 mg via ORAL
  Filled 2014-03-19 (×3): qty 1

## 2014-03-19 MED ORDER — ACETAMINOPHEN 325 MG PO TABS
650.0000 mg | ORAL_TABLET | ORAL | Status: DC | PRN
  Administered 2014-03-20: 650 mg via ORAL
  Filled 2014-03-19: qty 2

## 2014-03-19 MED ORDER — INSULIN ISOPHANE HUMAN 100 UNIT/ML KWIKPEN
20.0000 [IU] | PEN_INJECTOR | Freq: Every day | SUBCUTANEOUS | Status: DC
Start: 2014-03-19 — End: 2014-03-19

## 2014-03-19 MED ORDER — STERILE WATER FOR INJECTION IJ SOLN
INTRAMUSCULAR | Status: AC
  Administered 2014-03-19: 10 mL
  Filled 2014-03-19: qty 10

## 2014-03-19 MED ORDER — INSULIN ASPART 100 UNIT/ML ~~LOC~~ SOLN: 0.0000 [IU] | Freq: Every day | SUBCUTANEOUS | Status: DC

## 2014-03-19 MED ORDER — AMMONIA AROMATIC IN INHA
RESPIRATORY_TRACT | Status: AC
  Filled 2014-03-19: qty 30

## 2014-03-19 MED ORDER — ZIPRASIDONE MESYLATE 20 MG IM SOLR
20.0000 mg | Freq: Once | INTRAMUSCULAR | Status: AC
  Administered 2014-03-19: 20 mg via INTRAMUSCULAR
  Filled 2014-03-19: qty 20

## 2014-03-19 MED ORDER — LAMOTRIGINE 100 MG PO TABS
100.0000 mg | ORAL_TABLET | Freq: Every day | ORAL | Status: DC
  Administered 2014-03-19 – 2014-03-20 (×2): 100 mg via ORAL
  Filled 2014-03-19 (×2): qty 1

## 2014-03-19 NOTE — ED Notes (Signed)
Pt brought to the ED d/t SI and ETOH.

## 2014-03-19 NOTE — ED Notes (Signed)
Assessment team with pt at this time

## 2014-03-19 NOTE — ED Notes (Signed)
Pt ambulated to secure holding area with steady gait.
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
## 2014-03-19 NOTE — ED Notes (Signed)
Pt resting.Pt arouses to name but then goes back to sleep. Pt turning on side for comfort.

## 2014-03-19 NOTE — ED Notes (Signed)
Bed: [id number] Expected date:  Expected time:  Means of arrival:  Comments: Hold for ResB
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
## 2014-03-19 NOTE — ED Notes (Addendum)
Pt restraints removed. Pt verbalized that he will not act out. Pt reports he is cold and has to use bathroom. Pt was given warm blankets and urinal. Pt assisted to edge of bed and urinated in urinal. Pt got back in to bed. Pt is A&O and in NAD. Pt denies pain.

## 2014-03-19 NOTE — ED Notes (Signed)
Pt closed his eyes and refused to talk to TTS team

## 2014-03-19 NOTE — ED Notes (Signed)
Timothy OsmondRhonda [name] (wife) [phone] Password: Oct 23, 1976

## 2014-03-19 NOTE — Progress Notes (Signed)
Pt was too sedated to interview today. Will need to reevaluate tomorrow.
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
## 2014-03-19 NOTE — ED Provider Notes (Signed)
6:07 AM Sign out from Earley FavorGail Schulz, NP, at change of shift.  Pt has psychiatric history, brought in by wife today, responding to auditory hallucinations telling him to kill himself.  He is drinking alcohol to try to make the voices go away.  He has been given ativan and geodon and is in 4 point restraints while undergoing medical clearance.  Dr Jodi MourningZavitz is aware and is filing IVC papers at this time.    7:34 AM Patient to be seen by Irving BurtonEmily, to be assessed for inpatient treatment.    3:05 PM Pt is in psych hold pending placement.      Filed Vitals:   03/19/14 1150  BP:   Pulse: 79  Temp:   Resp: 18     Results for orders placed during the hospital encounter of 03/19/14  CBC WITH DIFFERENTIAL      Result Value Ref Range   WBC 13.8 (*) 4.0 - 10.5 K/uL   RBC 4.12 (*) 4.22 - 5.81 MIL/uL   Hemoglobin 12.8 (*) 13.0 - 17.0 g/dL   HCT 16.136.2 (*) 09.639.0 - 04.552.0 %   MCV 87.9  78.0 - 100.0 fL   MCH 31.1  26.0 - 34.0 pg   MCHC 35.4  30.0 - 36.0 g/dL   RDW 40.913.4  81.111.5 - 91.415.5 %   Platelets 293  150 - 400 K/uL   Neutrophils Relative % 71  43 - 77 %   Neutro Abs 9.8 (*) 1.7 - 7.7 K/uL   Lymphocytes Relative 21  12 - 46 %   Lymphs Abs 2.9  0.7 - 4.0 K/uL   Monocytes Relative 7  3 - 12 %   Monocytes Absolute 1.0  0.1 - 1.0 K/uL   Eosinophils Relative 0  0 - 5 %   Eosinophils Absolute 0.0  0.0 - 0.7 K/uL   Basophils Relative 0  0 - 1 %   Basophils Absolute 0.0  0.0 - 0.1 K/uL  URINE RAPID DRUG SCREEN (HOSP PERFORMED)      Result Value Ref Range   Opiates NONE DETECTED  NONE DETECTED   Cocaine POSITIVE (*) NONE DETECTED   Benzodiazepines NONE DETECTED  NONE DETECTED   Amphetamines NONE DETECTED  NONE DETECTED   Tetrahydrocannabinol NONE DETECTED  NONE DETECTED   Barbiturates NONE DETECTED  NONE DETECTED  ETHANOL      Result Value Ref Range   Alcohol, Ethyl (B) 149 (*) 0 - 11 mg/dL  ACETAMINOPHEN LEVEL      Result Value Ref Range   Acetaminophen (Tylenol), Serum <15.0  10 - 30 ug/mL  SALICYLATE  LEVEL      Result Value Ref Range   Salicylate Lvl <2.0 (*) 2.8 - 20.0 mg/dL  I-STAT CHEM 8, ED      Result Value Ref Range   Sodium 130 (*) 137 - 147 mEq/L   Potassium 4.0  3.7 - 5.3 mEq/L   Chloride 96  96 - 112 mEq/L   BUN <3 (*) 6 - 23 mg/dL   Creatinine, Ser 7.820.90  0.50 - 1.35 mg/dL   Glucose, Bld 956203 (*) 70 - 99 mg/dL   Calcium, Ion 2.131.11 (*) 1.12 - 1.23 mmol/L   TCO2 14  0 - 100 mmol/L   Hemoglobin 14.3  13.0 - 17.0 g/dL   HCT 08.642.0  57.839.0 - 46.952.0 %  CBG MONITORING, ED      Result Value Ref Range   Glucose-Capillary 185 (*) 70 - 99 mg/dL   No results  found.    Trixie DredgeEmily Jarelyn Bambach, PA-C 03/19/14 1524

## 2014-03-19 NOTE — ED Notes (Signed)
Patient belongings- shoes shirt and pants

## 2014-03-19 NOTE — ED Notes (Signed)
Pt is tearful and saying, "I just want these thoughts to stop."
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
## 2014-03-19 NOTE — ED Notes (Signed)
Pt resting comfortably. Rise and fall of chest noted. Pt in view of this RN. Stretcher locked and in lowest position.
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
## 2014-03-19 NOTE — ED Notes (Signed)
Unable to complete psych assessment. Remains too sleepy and confused from Geodon and Ativan given this morning in the ED. Has been up to the restroom this afternoon and at that time was able to give him some meds. States he does not remember how he got here or why. Explained that I understood his wife brought him here because he had been drinking and stated he wanted to kill himself. I then asked if he felt like hurting himself now and he said no. At this point he closed his eyes and was sleep again. BAL 149 and UDS was positive for cocaine. CBG 203 at 0601 this morning and is now 97. Given OJ and did eat half of his cake from lunch. Will encourage to eat some of his dinner when it arrives.

## 2014-03-20 ENCOUNTER — Encounter (HOSPITAL_COMMUNITY): Payer: Self-pay

## 2014-03-20 ENCOUNTER — Encounter (HOSPITAL_COMMUNITY): Payer: Self-pay | Admitting: Psychiatry

## 2014-03-20 ENCOUNTER — Inpatient Hospital Stay (HOSPITAL_COMMUNITY)
Admission: AD | Admit: 2014-03-20 | Discharge: 2014-03-23 | DRG: 885 | Disposition: A | Discharge status: Home / Self Care | Payer: 59 | Source: Intra-hospital | Attending: Psychiatry | Admitting: Psychiatry

## 2014-03-20 DIAGNOSIS — F419 Anxiety disorder, unspecified: Secondary | ICD-10-CM

## 2014-03-20 DIAGNOSIS — R45851 Suicidal ideations: Secondary | ICD-10-CM | POA: Diagnosis not present

## 2014-03-20 DIAGNOSIS — E119 Type 2 diabetes mellitus without complications: Secondary | ICD-10-CM | POA: Diagnosis present

## 2014-03-20 DIAGNOSIS — F1410 Cocaine abuse, uncomplicated: Secondary | ICD-10-CM | POA: Diagnosis present

## 2014-03-20 DIAGNOSIS — Z818 Family history of other mental and behavioral disorders: Secondary | ICD-10-CM

## 2014-03-20 DIAGNOSIS — Z833 Family history of diabetes mellitus: Secondary | ICD-10-CM | POA: Diagnosis not present

## 2014-03-20 DIAGNOSIS — F1020 Alcohol dependence, uncomplicated: Secondary | ICD-10-CM | POA: Diagnosis present

## 2014-03-20 DIAGNOSIS — F1910 Other psychoactive substance abuse, uncomplicated: Secondary | ICD-10-CM

## 2014-03-20 DIAGNOSIS — F411 Generalized anxiety disorder: Secondary | ICD-10-CM

## 2014-03-20 DIAGNOSIS — G8929 Other chronic pain: Secondary | ICD-10-CM | POA: Diagnosis present

## 2014-03-20 DIAGNOSIS — F333 Major depressive disorder, recurrent, severe with psychotic symptoms: Secondary | ICD-10-CM | POA: Diagnosis present

## 2014-03-20 DIAGNOSIS — K219 Gastro-esophageal reflux disease without esophagitis: Secondary | ICD-10-CM | POA: Diagnosis present

## 2014-03-20 DIAGNOSIS — F17200 Nicotine dependence, unspecified, uncomplicated: Secondary | ICD-10-CM | POA: Diagnosis present

## 2014-03-20 DIAGNOSIS — Z8249 Family history of ischemic heart disease and other diseases of the circulatory system: Secondary | ICD-10-CM | POA: Diagnosis not present

## 2014-03-20 DIAGNOSIS — F1010 Alcohol abuse, uncomplicated: Secondary | ICD-10-CM | POA: Diagnosis present

## 2014-03-20 DIAGNOSIS — F1994 Other psychoactive substance use, unspecified with psychoactive substance-induced mood disorder: Secondary | ICD-10-CM | POA: Diagnosis present

## 2014-03-20 DIAGNOSIS — E7800 Pure hypercholesterolemia, unspecified: Secondary | ICD-10-CM | POA: Diagnosis present

## 2014-03-20 DIAGNOSIS — Z794 Long term (current) use of insulin: Secondary | ICD-10-CM | POA: Diagnosis not present

## 2014-03-20 DIAGNOSIS — E785 Hyperlipidemia, unspecified: Secondary | ICD-10-CM | POA: Diagnosis present

## 2014-03-20 DIAGNOSIS — G4700 Insomnia, unspecified: Secondary | ICD-10-CM | POA: Diagnosis present

## 2014-03-20 DIAGNOSIS — F332 Major depressive disorder, recurrent severe without psychotic features: Secondary | ICD-10-CM

## 2014-03-20 DIAGNOSIS — F39 Unspecified mood [affective] disorder: Secondary | ICD-10-CM | POA: Diagnosis present

## 2014-03-20 LAB — GLUCOSE, CAPILLARY
Glucose-Capillary: 199 mg/dL — ABNORMAL HIGH (ref 70–99)
Glucose-Capillary: 187 mg/dL — ABNORMAL HIGH (ref 70–99)

## 2014-03-20 LAB — CBG MONITORING, ED
GLUCOSE-CAPILLARY: 150 mg/dL — AB (ref 70–99)
Glucose-Capillary: 140 mg/dL — ABNORMAL HIGH (ref 70–99)

## 2014-03-20 MED ORDER — HYDROXYZINE HCL 25 MG PO TABS: 25.0000 mg | ORAL_TABLET | Freq: Four times a day (QID) | ORAL | Status: DC | PRN

## 2014-03-20 MED ORDER — SIMVASTATIN 40 MG PO TABS
40.0000 mg | ORAL_TABLET | Freq: Every day | ORAL | Status: DC
  Administered 2014-03-20 – 2014-03-22 (×3): 40 mg via ORAL
  Filled 2014-03-20 (×4): qty 1

## 2014-03-20 MED ORDER — LORATADINE 10 MG PO TABS
10.0000 mg | ORAL_TABLET | Freq: Every day | ORAL | Status: DC
  Administered 2014-03-20 – 2014-03-23 (×4): 10 mg via ORAL
  Filled 2014-03-20 (×5): qty 1

## 2014-03-20 MED ORDER — MELOXICAM 7.5 MG PO TABS
7.5000 mg | ORAL_TABLET | Freq: Every day | ORAL | Status: DC
  Administered 2014-03-21 – 2014-03-23 (×3): 7.5 mg via ORAL
  Filled 2014-03-20 (×4): qty 1

## 2014-03-20 MED ORDER — TRAZODONE HCL 100 MG PO TABS
100.0000 mg | ORAL_TABLET | Freq: Every day | ORAL | Status: DC
  Administered 2014-03-20 – 2014-03-22 (×3): 100 mg via ORAL
  Filled 2014-03-20 (×5): qty 1

## 2014-03-20 MED ORDER — OMEGA-3-ACID ETHYL ESTERS 1 G PO CAPS
1.0000 g | ORAL_CAPSULE | Freq: Every day | ORAL | Status: DC
  Administered 2014-03-21 – 2014-03-23 (×3): 1 g via ORAL
  Filled 2014-03-20 (×4): qty 1

## 2014-03-20 MED ORDER — INSULIN ASPART 100 UNIT/ML ~~LOC~~ SOLN
0.0000 [IU] | Freq: Three times a day (TID) | SUBCUTANEOUS | Status: DC
  Administered 2014-03-21: 5 [IU] via SUBCUTANEOUS
  Administered 2014-03-21: 7 [IU] via SUBCUTANEOUS
  Administered 2014-03-21: 5 [IU] via SUBCUTANEOUS
  Administered 2014-03-22: 2 [IU] via SUBCUTANEOUS
  Administered 2014-03-22 (×2): 5 [IU] via SUBCUTANEOUS
  Administered 2014-03-23: 3 [IU] via SUBCUTANEOUS

## 2014-03-20 MED ORDER — PIOGLITAZONE HCL 15 MG PO TABS
15.0000 mg | ORAL_TABLET | Freq: Every day | ORAL | Status: DC
  Administered 2014-03-20 – 2014-03-23 (×4): 15 mg via ORAL
  Filled 2014-03-20 (×5): qty 1

## 2014-03-20 MED ORDER — IBUPROFEN 800 MG PO TABS
800.0000 mg | ORAL_TABLET | Freq: Four times a day (QID) | ORAL | Status: DC | PRN
  Administered 2014-03-21 – 2014-03-22 (×2): 800 mg via ORAL
  Filled 2014-03-20 (×2): qty 1

## 2014-03-20 MED ORDER — DULOXETINE HCL 60 MG PO CPEP
60.0000 mg | ORAL_CAPSULE | Freq: Every day | ORAL | Status: DC
  Administered 2014-03-20 – 2014-03-23 (×4): 60 mg via ORAL
  Filled 2014-03-20 (×5): qty 1

## 2014-03-20 MED ORDER — LAMOTRIGINE 100 MG PO TABS
100.0000 mg | ORAL_TABLET | Freq: Every day | ORAL | Status: DC
  Administered 2014-03-21 – 2014-03-23 (×3): 100 mg via ORAL
  Filled 2014-03-20 (×4): qty 1

## 2014-03-20 MED ORDER — INSULIN GLARGINE 100 UNIT/ML ~~LOC~~ SOLN
30.0000 [IU] | Freq: Every day | SUBCUTANEOUS | Status: DC
  Administered 2014-03-20 – 2014-03-22 (×3): 30 [IU] via SUBCUTANEOUS

## 2014-03-20 MED ORDER — METFORMIN HCL 500 MG PO TABS
1000.0000 mg | ORAL_TABLET | Freq: Two times a day (BID) | ORAL | Status: DC
  Administered 2014-03-20 – 2014-03-23 (×6): 1000 mg via ORAL
  Filled 2014-03-20 (×10): qty 2

## 2014-03-20 NOTE — BHH Counselor (Addendum)
Pt accepted to bed 300-1. [facility]. Originals placed in pt's chart. Eric RN to call [employer] re transportation as pt is under IVC.  Evette Cristalaroline Paige Mackinsey Pelland, [place] Assessment Counselor

## 2014-03-20 NOTE — Telephone Encounter (Signed)
Note sent to MD 5/11:Currently inpatient.

## 2014-03-20 NOTE — Tx Team (Signed)
Initial Interdisciplinary Treatment Plan  PATIENT STRENGTHS: (choose at least two) General fund of knowledge Supportive family/friends  PATIENT STRESSORS: Substance abuse   PROBLEM LIST: Problem List/Patient Goals Date to be addressed Date deferred Reason deferred Estimated date of resolution  Depression 03/20/14     Risk for suicide 03/20/14     SA 03/20/14                                          DISCHARGE CRITERIA:  Improved stabilization in mood, thinking, and/or behavior  PRELIMINARY DISCHARGE PLAN: Attend aftercare/continuing care group Attend PHP/IOP Attend 12-step recovery group  PATIENT/FAMIILY INVOLVEMENT: This treatment plan has been presented to and reviewed with the patient, Mirna MiresJeffrey L Hodgdon Jr..  The patient and family have been given the opportunity to ask questions and make suggestions.  Delos HaringMichael A Tanna Loeffler 03/20/2014, 3:50 PM

## 2014-03-20 NOTE — Progress Notes (Signed)
D   Pt is depressed and anxious   He reports having chronic pain in his neck and requested pain medication but went to sleep before orders were received   He reports hearing voices that are command telling him to kill himself   He denies suicidal ideation at present and he does contract for safety on the unit A   Verbal support given   Medications administered and effectiveness monitored   Encouraged group and warm packs to neck   Q 15 min checks R   Pt is safe at present and verbalizes understanding of instructions
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
## 2014-03-20 NOTE — Progress Notes (Signed)
Patient ID: [name], male   DOB: Mar 16, 1976, 38 y.o.   MRN: [id number] Admission Note:  D:37 yr male who presents IVC in no acute distress for the treatment of SI and Depression. Pt appears flat and depressed. Pt was calm and cooperative with admission process. Pt denies SI/HI/AVH at this time. Pt stated he stopped taking his Lamictal 2 months ago because he thought it was too much, making him feel groggy and sleepy all the time. Pt stated after he stopped his med his depression and SI increased. Pt relapsed on ETOH 2 weeks ago drinking a 12 pk per weekend.   A:Skin was assessed and found to be clear of any abnormal marks apart from a bruise R-antecubital space, abrasions: R-elbow,R-hand,L-forearm. Tattoo R-leg.Marland Kitchen. POC and unit policies explained and understanding verbalized. Consents obtained. Food and fluids offered, and fluids accepted.  R: Pt had no additional questions or concerns.
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
## 2014-03-21 ENCOUNTER — Encounter (HOSPITAL_COMMUNITY): Payer: Self-pay | Admitting: Psychiatry

## 2014-03-21 DIAGNOSIS — F1020 Alcohol dependence, uncomplicated: Secondary | ICD-10-CM | POA: Diagnosis present

## 2014-03-21 DIAGNOSIS — F1994 Other psychoactive substance use, unspecified with psychoactive substance-induced mood disorder: Secondary | ICD-10-CM

## 2014-03-21 DIAGNOSIS — F39 Unspecified mood [affective] disorder: Secondary | ICD-10-CM | POA: Diagnosis present

## 2014-03-21 LAB — GLUCOSE, CAPILLARY
GLUCOSE-CAPILLARY: 265 mg/dL — AB (ref 70–99)
Glucose-Capillary: 277 mg/dL — ABNORMAL HIGH (ref 70–99)
Glucose-Capillary: 320 mg/dL — ABNORMAL HIGH (ref 70–99)
Glucose-Capillary: 256 mg/dL — ABNORMAL HIGH (ref 70–99)

## 2014-03-21 MED ORDER — ONDANSETRON 4 MG PO TBDP: 4.0000 mg | ORAL_TABLET | Freq: Four times a day (QID) | ORAL | Status: DC | PRN

## 2014-03-21 MED ORDER — NICOTINE 21 MG/24HR TD PT24
21.0000 mg | MEDICATED_PATCH | Freq: Every day | TRANSDERMAL | Status: DC
  Administered 2014-03-21 – 2014-03-23 (×3): 21 mg via TRANSDERMAL
  Filled 2014-03-21 (×4): qty 1

## 2014-03-21 MED ORDER — LORAZEPAM 1 MG PO TABS
1.0000 mg | ORAL_TABLET | Freq: Four times a day (QID) | ORAL | Status: DC | PRN
Start: 2014-03-21 — End: 2014-03-21
  Administered 2014-03-21: 1 mg via ORAL
  Filled 2014-03-21: qty 1

## 2014-03-21 MED ORDER — CHLORDIAZEPOXIDE HCL 25 MG PO CAPS
25.0000 mg | ORAL_CAPSULE | Freq: Four times a day (QID) | ORAL | Status: AC
  Administered 2014-03-21 – 2014-03-22 (×4): 25 mg via ORAL
  Filled 2014-03-21 (×3): qty 1

## 2014-03-21 MED ORDER — CHLORDIAZEPOXIDE HCL 25 MG PO CAPS
25.0000 mg | ORAL_CAPSULE | Freq: Every day | ORAL | Status: DC
Start: 2014-03-24 — End: 2014-03-23

## 2014-03-21 MED ORDER — LOPERAMIDE HCL 2 MG PO CAPS: 2.0000 mg | ORAL_CAPSULE | ORAL | Status: DC | PRN

## 2014-03-21 MED ORDER — CHLORDIAZEPOXIDE HCL 25 MG PO CAPS
25.0000 mg | ORAL_CAPSULE | ORAL | Status: DC
Start: 2014-03-23 — End: 2014-03-23
  Administered 2014-03-23: 25 mg via ORAL
  Filled 2014-03-21: qty 1

## 2014-03-21 MED ORDER — CHLORDIAZEPOXIDE HCL 25 MG PO CAPS
25.0000 mg | ORAL_CAPSULE | Freq: Four times a day (QID) | ORAL | Status: DC | PRN
  Administered 2014-03-21 – 2014-03-22 (×2): 25 mg via ORAL
  Filled 2014-03-21: qty 1

## 2014-03-21 MED ORDER — HYDROXYZINE HCL 25 MG PO TABS: 25.0000 mg | ORAL_TABLET | Freq: Four times a day (QID) | ORAL | Status: DC | PRN

## 2014-03-21 MED ORDER — THIAMINE HCL 100 MG/ML IJ SOLN
100.0000 mg | Freq: Once | INTRAMUSCULAR | Status: AC
  Administered 2014-03-21: 100 mg via INTRAMUSCULAR
  Filled 2014-03-21: qty 2

## 2014-03-21 MED ORDER — CHLORDIAZEPOXIDE HCL 25 MG PO CAPS
ORAL_CAPSULE | ORAL | Status: AC
  Administered 2014-03-21: 25 mg via ORAL
  Filled 2014-03-21: qty 1

## 2014-03-21 MED ORDER — ADULT MULTIVITAMIN W/MINERALS CH
1.0000 | ORAL_TABLET | Freq: Every day | ORAL | Status: DC
  Administered 2014-03-22 – 2014-03-23 (×2): 1 via ORAL
  Filled 2014-03-21 (×4): qty 1

## 2014-03-21 MED ORDER — VITAMIN B-1 100 MG PO TABS
100.0000 mg | ORAL_TABLET | Freq: Every day | ORAL | Status: DC
  Administered 2014-03-22 – 2014-03-23 (×2): 100 mg via ORAL
  Filled 2014-03-21 (×3): qty 1

## 2014-03-21 MED ORDER — CHLORDIAZEPOXIDE HCL 25 MG PO CAPS
25.0000 mg | ORAL_CAPSULE | Freq: Three times a day (TID) | ORAL | Status: AC
  Administered 2014-03-22: 25 mg via ORAL
  Filled 2014-03-21 (×2): qty 1

## 2014-03-21 MED ORDER — CHLORDIAZEPOXIDE HCL 25 MG PO CAPS: 25.0000 mg | ORAL_CAPSULE | Freq: Once | ORAL | Status: DC

## 2014-03-21 NOTE — Progress Notes (Signed)
Pt attended spiritual care group on grief and loss facilitated by chaplain Burnis KingfisherMatthew Bronwyn Belasco.  Group opened with brief discussion and psycho-social ed around grief and loss in relationships and in relation to self - identifying life patterns, circumstances, changes that cause losses. Established group norm of speaking from own life experience. Group goal of establishing open and affirming space for members to share loss and experience with grief, normalize grief experience and provide psycho social education and grief support.    [name] was attentive throughout group.  Not vocal during first half of group, but later became vocal in identifying with another group member's family story.  [name] shared his fears for his children. He does not wish them to experience the same anger and disconnection from him that he experienced with his father.  [name] expressed grief over his past and anger around the environment in which he was raised - stating "I coped the way I learned how."  [name] was supported by the group in discerning the elements of our past which we can take responsibility for and find peace with and those which we must recognize were not our choice.  [name] is highly motivated by his children and family.   Clover MealyMatthew W Abigial Newville MDiv

## 2014-03-21 NOTE — ED Provider Notes (Signed)
Medical screening examination/treatment/procedure(s) were conducted as a shared visit with non-physician practitioner(s) or resident  and myself.  I personally evaluated the patient during the encounter and agree with the findings and plan unless otherwise indicated.    I have personally reviewed any xrays and/ or EKG's with the provider and I agree with interpretation.   See separate note  Enid SkeensJoshua M Krisann Mckenna, MD 03/21/14 [phone]

## 2014-03-21 NOTE — ED Provider Notes (Signed)
Medical screening examination/treatment/procedure(s) were conducted as a shared visit with non-physician practitioner(s) or resident  and myself.  I personally evaluated the patient during the encounter and agree with the findings and plan unless otherwise indicated.    I have personally reviewed any xrays and/ or EKG's with the provider and I agree with interpretation.   See separate note  Enid SkeensJoshua M Katharyn Schauer, MD 03/21/14 [phone]

## 2014-03-21 NOTE — [facility] Group Notes (Signed)
[facility] LCSW Group Therapy  03/21/2014 3:17 PM  Type of Therapy:  Group Therapy  Participation Level:  Active  Participation Quality:  Attentive  Affect:  Flat  Cognitive:  Alert and Oriented  Insight:  Improving  Engagement in Therapy:  Improving  Modes of Intervention:  Confrontation, Discussion, Education, Exploration, Problem-solving, Rapport Building, Socialization and Support  Summary of Progress/Problems: [facility] Speaker came to talk about his personal journey with substance abuse and addiction. The pt processed ways by which to relate to the speaker. MHA speaker provided handouts and educational information pertaining to groups and services offered by [facility]. Trey PaulaJeff was attentive and engaged throughout today's therapy group. He actively listened as speaker shared his personal story and reviewed resources offered by [facility].   Sayyid Harewood Smart LCSWA  03/21/2014, 3:17 PM

## 2014-03-21 NOTE — [facility] Group Notes (Signed)
[facility] Group Notes:  (Nursing/MHT/Case Management/Adjunct)  Date:  03/21/2014  Time:  0900 am  Type of Therapy:  Psychoeducational Skills  Participation Level:  Active  Participation Quality:  Appropriate and Attentive  Affect:  Appropriate  Cognitive:  Alert and Appropriate  Insight:  Improving  Engagement in Group:  Supportive  Modes of Intervention:  Support  Summary of Progress/Problems:  Cranford MonCaroline Evans Dondre Catalfamo 03/21/2014, 5:55 PM

## 2014-03-21 NOTE — Progress Notes (Signed)
Patient ID: Timothy MiresJeffrey L Smigelski Jr., male   DOB: September 12, 1976, 38 y.o.   MRN: [id number] He has been up and to groups interacting with peers and staff. Self inventory: depression 8,hopelessness 1, withdrawals  Agitation denies SI thoughts and pain.  CBG t lunch was 320 and sliding scale coverage of 7 units of novo log insulin was given.

## 2014-03-21 NOTE — H&P (Signed)
Psychiatric Admission Assessment Adult  Patient Identification:  Timothy MiresJeffrey L Vasseur Jr. Date of Evaluation:  03/21/2014 Chief Complaint:  MDD RECURRENT SERVERE WITH PSYCHOTIC FEATURES History of Present Illness:: 38 Y/O male who states that he stop taking his medication and started experiencing "the ups and downs and then back to the black out spells". Started drinking 2 weeks ago. States he does not remember using cocaine although the drug screen is positive. He was taking lamictal 50 mg and he states during the last visit it was increased to 100 mg. States that the 100 mg was making him more sedated so rather than going back down to 50 mg he quit taking it altogether. States that shortly after that he started having problems with his mood. Elements:   Associated Signs/Synptoms: Depression Symptoms:  depressed mood, anhedonia, insomnia, fatigue, suicidal thoughts without plan, anxiety, loss of energy/fatigue, disturbed sleep, weight loss, (Hypo) Manic Symptoms:  Impulsivity, Irritable Mood, Labiality of Mood, Anxiety Symptoms:  Excessive Worry, Social Anxiety, Psychotic Symptoms:  Denies PTSD Symptoms: Negative Total Time spent with patient: 45 minutes  Psychiatric Specialty Exam: Physical Exam  Review of Systems  Constitutional: Positive for weight loss and malaise/fatigue.  Eyes: Negative.   Respiratory:       Pack and a half to two pack a day  Cardiovascular: Negative.   Gastrointestinal: Negative.   Genitourinary: Negative.   Musculoskeletal: Positive for back pain and neck pain.  Skin: Negative.   Neurological: Positive for tremors and weakness.  Endo/Heme/Allergies: Negative.   Psychiatric/Behavioral: Positive for depression, suicidal ideas and substance abuse. The patient is nervous/anxious and has insomnia.     Blood pressure 143/92, pulse 99, temperature 97.5 F (36.4 C), temperature source Oral, resp. rate 16, height 6' (1.829 m), weight 91.627 kg (202 lb).Body  mass index is 27.39 kg/(m^2).  General Appearance: Fairly Groomed  Patent attorneyye Contact::  Fair  Speech:  Clear and Coherent and not spontaneous  Volume:  Decreased  Mood:  Anxious and Depressed  Affect:   sad, worried  Thought Process:  Coherent and Goal Directed  Orientation:  Full (Time, Place, and Person)  Thought Content:  sympotm, worries, concerns, regrets, guilt  Suicidal Thoughts:  No  Homicidal Thoughts:  No  Memory:  Immediate;   Fair Recent;   Poor Remote;   Fair  Judgement:  Fair  Insight:  Present  Psychomotor Activity:  Restlessness  Concentration:  Fair  Recall:  Poor  Fund of Knowledge:NA  Language: Fair  Akathisia:  No  Handed:    AIMS (if indicated):     Assets:  Desire for Improvement Housing Social Support Transportation Vocational/Educational  Sleep:       Musculoskeletal: Strength & Muscle Tone: within normal limits Gait & Station: normal Patient leans: N/A  Past Psychiatric History: Diagnosis:  [facility]  [facility]  Substance Abuse Care: ADS, ADACT   Self-Mutilation: Denies  Suicidal Attempts: Denies  Violent Behaviors: Yes when drinking   Past Medical History:   Past Medical History  Diagnosis Date  . Allergy   . Depression   . GERD (gastroesophageal reflux disease)   . Hyperlipidemia   . Chronic pain disorder     Sees [facility]  . Urinary incontinence     detrusor instability  . Hypogonadism   . Diabetes mellitus     Type II  . Chronic neck pain    Loss of Consciousness:  falls Traumatic Brain Injury:  fights, falls Allergies:  Allergies  Allergen Reactions  . Ciprofloxacin Hives  . Hydrocodone     Rash and nausea   PTA Medications: Prescriptions prior to admission  Medication Sig Dispense Refill  . acetaminophen (TYLENOL) 500 MG tablet Take 1,000 mg by mouth every 6 (six) hours as needed for mild pain.      . cetirizine (ZYRTEC) 10 MG tablet Take 10 mg by mouth  daily.      . cyclobenzaprine (FLEXERIL) 5 MG tablet Take 1 tablet (5 mg total) by mouth 3 (three) times daily as needed for muscle spasms.  20 tablet  0  . DULoxetine (CYMBALTA) 60 MG capsule Take 60 mg by mouth daily.      . Insulin Glargine (LANTUS) 100 UNIT/ML Solostar Pen Inject 30 Units into the skin at bedtime.       . metFORMIN (GLUCOPHAGE) 1000 MG tablet Take 1,000 mg by mouth 2 (two) times daily with a meal.      . nabumetone (RELAFEN) 500 MG tablet Take 1 tablet by mouth daily.      Marland Kitchen omega-3 acid ethyl esters (LOVAZA) 1 G capsule Take 4 g by mouth daily.      Marland Kitchen oxyCODONE-acetaminophen (PERCOCET) 5-325 MG per tablet Take 1-2 tablets by mouth every 4 (four) hours as needed.  20 tablet  0  . pioglitazone (ACTOS) 15 MG tablet Take 1 tablet (15 mg total) by mouth daily.  30 tablet  5  . simvastatin (ZOCOR) 40 MG tablet Take 1 tablet (40 mg total) by mouth at bedtime.  30 tablet  5  . traZODone (DESYREL) 100 MG tablet Take 100 mg by mouth at bedtime.        Previous Psychotropic Medications:  Medication/Dose    Lamictal 100 mg, Cymbalta 60 mg             Substance Abuse History in the last 12 months:  yes  Consequences of Substance Abuse: Legal Consequences:  DWI X 4, assault Blackouts:   Withdrawal Symptoms:   Nausea Tremors  Social History:  reports that he has been smoking Cigarettes.  He has been smoking about 1.50 packs per day. He has never used smokeless tobacco. He reports that he drinks alcohol. He reports that he uses illicit drugs (Cocaine). 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 Legal History: DWI, assault  Hobbies/Interests:  Family History:   Family History  Problem Relation Age of Onset  . Diabetes Mother   . Hypertension Mother   . Cirrhosis Mother   . Depression Mother   . Bipolar disorder Mother   . Arthritis Father 49    osteoarthritis  . Bipolar disorder Father   . Diabetes Sister     borderline  . Heart disease Paternal Uncle 25    MI  . Heart disease Maternal Grandfather     late 60's--MI  . Cancer Paternal Grandmother 40    lung  . Bipolar disorder Paternal Grandmother   . Heart disease Paternal Grandfather 56    MI  . Bipolar disorder Paternal Grandfather     Results for orders placed during the hospital encounter of  03/20/14 (from the past 72 hour(s))  GLUCOSE, CAPILLARY     Status: Abnormal   Collection Time    03/20/14  5:02 PM      Result Value Ref Range   Glucose-Capillary 199 (*) 70 - 99 mg/dL   Comment 1 Notify RN    GLUCOSE, CAPILLARY     Status: Abnormal   Collection Time    03/20/14  8:44 PM      Result Value Ref Range   Glucose-Capillary 187 (*) 70 - 99 mg/dL  GLUCOSE, CAPILLARY     Status: Abnormal   Collection Time    03/21/14  6:25 AM      Result Value Ref Range   Glucose-Capillary 277 (*) 70 - 99 mg/dL   Psychological Evaluations:  Assessment:   DSM5:  Schizophrenia Disorders:  none Obsessive-Compulsive Disorders:  none Trauma-Stressor Disorders:  none Substance/Addictive Disorders:  Alcohol Intoxication with Use Disorder - Severe (F10.229), cocaine related disorder Depressive Disorders:  Major Depressive Disorder - Moderate (296.22)  AXIS I:  Mood Disorder NOS and Substance Induced Mood Disorder AXIS II:  Deferred AXIS III:   Past Medical History  Diagnosis Date  . Allergy   . Depression   . GERD (gastroesophageal reflux disease)   . Hyperlipidemia   . Chronic pain disorder     Sees [facility]  . Urinary incontinence     detrusor instability  . Hypogonadism   . Diabetes  mellitus     Type II  . Chronic neck pain    AXIS IV:  other psychosocial or environmental problems AXIS V:  41-50 serious symptoms  Treatment Plan/Recommendations:  Supportive approach/coping skills/relapse prevention                                                                 Ativan vs. Librium detox protocol                                                                 Resume Lamictal                                                                  Optimize response to psychotropics  Treatment Plan Summary: Daily contact with patient to assess and evaluate symptoms and progress in treatment Medication management Current Medications:  Current Facility-Administered Medications  Medication Dose Route Frequency Provider Last Rate Last Dose  . DULoxetine (CYMBALTA) DR capsule 60 mg  60 mg Oral Daily Nanine Means, NP   60 mg at 03/21/14 0802  . hydrOXYzine (ATARAX/VISTARIL) tablet 25 mg  25 mg Oral Q6H PRN Nanine Means, NP      . ibuprofen (ADVIL,MOTRIN) tablet 800 mg  800 mg Oral Q6H PRN Nanine Means, NP      . insulin aspart (novoLOG) injection 0-9 Units  0-9 Units Subcutaneous  TID WC Nanine MeansJamison Lord, NP   5 Units at 03/21/14 [phone]  . insulin glargine (LANTUS) injection 30 Units  30 Units Subcutaneous QHS Nanine MeansJamison Lord, NP   30 Units at 03/20/14 2124  . lamoTRIgine (LAMICTAL) tablet 100 mg  100 mg Oral Daily Nanine MeansJamison Lord, NP   100 mg at 03/21/14 0802  . loratadine (CLARITIN) tablet 10 mg  10 mg Oral Daily Nanine MeansJamison Lord, NP   10 mg at 03/21/14 0802  . meloxicam (MOBIC) tablet 7.5 mg  7.5 mg Oral Daily Nanine MeansJamison Lord, NP   7.5 mg at 03/21/14 0802  . metFORMIN (GLUCOPHAGE) tablet 1,000 mg  1,000 mg Oral BID WC Nanine MeansJamison Lord, NP   1,000 mg at 03/21/14 0802  . omega-3 acid ethyl esters (LOVAZA) capsule 1 g  1 g Oral Daily Nanine MeansJamison Lord, NP   1 g at 03/21/14 0802  . pioglitazone (ACTOS) tablet 15 mg  15 mg Oral Daily Nanine MeansJamison Lord, NP   15 mg at 03/21/14 0802  . simvastatin (ZOCOR) tablet 40 mg  40  mg Oral QHS Nanine MeansJamison Lord, NP   40 mg at 03/20/14 2123  . traZODone (DESYREL) tablet 100 mg  100 mg Oral QHS Nanine MeansJamison Lord, NP   100 mg at 03/20/14 2123    Observation Level/Precautions:  15 minute checks  Laboratory:  As per the ED  Psychotherapy; Individual, group  Medications:  Ativan vs Librium detox, resume the Lamictal   Consultations:    Discharge Concerns:    Estimated LOS: 3-5 days  Other:     I certify that inpatient services furnished can reasonably be expected to improve the patient's condition.   Rachael FeeIrving A Treven Holtman 5/12/20158:11 AM

## 2014-03-21 NOTE — [facility] Suicide Risk Assessment (Signed)
Suicide Risk Assessment  Admission Assessment     Nursing information obtained from:    Demographic factors:    Current Mental Status:    Loss Factors:    Historical Factors:    Risk Reduction Factors:    Total Time spent with patient: 45 minutes  CLINICAL FACTORS:   Alcohol/Substance Abuse/Dependencies, Mood Diosrder COGNITIVE FEATURES THAT CONTRIBUTE TO RISK:  Closed-mindedness Polarized thinking Thought constriction (tunnel vision)    SUICIDE RISK:   Moderate:  Frequent suicidal ideation with limited intensity, and duration, some specificity in terms of plans, no associated intent, good self-control, limited dysphoria/symptomatology, some risk factors present, and identifiable protective factors, including available and accessible social support.  PLAN OF CARE: Supportive approach/coping skills/relapse prevention                               Librium vs Ativan Detox                               Resume Lamictal 100 mg (will decrease to 1/2 if not able to tolerate)  I certify that inpatient services furnished can reasonably be expected to improve the patient's condition.  [name] [name] 03/21/2014, 4:38 PM

## 2014-03-22 LAB — GLUCOSE, CAPILLARY
GLUCOSE-CAPILLARY: 199 mg/dL — AB (ref 70–99)
Glucose-Capillary: 174 mg/dL — ABNORMAL HIGH (ref 70–99)
Glucose-Capillary: 287 mg/dL — ABNORMAL HIGH (ref 70–99)
Glucose-Capillary: 266 mg/dL — ABNORMAL HIGH (ref 70–99)

## 2014-03-22 NOTE — [facility] Group Notes (Signed)
[facility]   03/22/2014 8:45 AM  Participation Quality:  Alert, Appropriate and Oriented  Mood/Affect:  Calm  Depression Rating:  1-2  Anxiety Rating:  1-2  Thoughts of Suicide:  Pt denies SI/HI  Will you contract for safety?   Yes  Current AVH:  Pt denies  Plan for Discharge/Comments:  Pt attended discharge planning group and actively participated in group.  CSW provided pt with today's workbook.  Pt reports feeling pretty good today.  Pt states that he will return home in [place] and [facility] medication management and therapy.  No further needs voiced by pt at this time.    Transportation Means: Pt reports access to transportation - family will pick pt up  Supports: No supports mentioned at this time  Reyes IvanChelsea Horton, LCSW 03/22/2014 9:13 AM

## 2014-03-22 NOTE — Progress Notes (Signed)
Recreation Therapy Notes   Animal-Assisted Activity/Therapy (AAA/T) Program Checklist/Progress Notes Patient Eligibility Criteria Checklist & Daily Group note for Rec Tx Intervention  Date: 05.12.2015 Time: 2:45pm [address]   AAA/T Program Assumption of Risk Form signed by Patient/ or Parent Legal Guardian yes  Patient is free of allergies or sever asthma yes  Patient reports no fear of animals yes  Patient reports no history of cruelty to animals yes   Patient understands his/her participation is voluntary yes  Patient washes hands before animal contact yes  Patient washes hands after animal contact yes  Behavioral Response: Appropriate, Engaged  Education: Charity fundraiserHand Washing, Appropriate Animal Interaction   Education Outcome: Acknowledges understanding   Clinical Observations/Feedback: Patient appropriately interacted with therapy dog and peers in session.   Keiara Sneeringer L Estha Few, LRT/CTRS  Mildreth Reek L Dewana Ammirati 03/22/2014 8:12 AM

## 2014-03-22 NOTE — [facility] Suicide Risk Assessment (Signed)
[facility] INPATIENT:  Family/Significant Other Suicide Prevention Education  Suicide Prevention Education:  Contact Attempts: [name] wife Richarda Osmond([name] Beam) [phone] has been identified by the patient as the family member/significant other with whom the patient will be residing, and identified as the person(s) who will aid the patient in the event of a mental health crisis.  With written consent from the patient, two attempts were made to provide suicide prevention education, prior to and/or following the patient's discharge.  We were unsuccessful in providing suicide prevention education.  A suicide education pamphlet was given to the patient to share with family/significant other.  Date and time of first attempt: 03/21/14 3PM  Date and time of second attempt: 03/22/14 2:40PM (vm left)  The Sherwin-WilliamsHeather Smart LCSWA  03/22/2014, 2:42 PM

## 2014-03-22 NOTE — Tx Team (Signed)
Interdisciplinary Treatment Plan Update (Adult)  Date: 03/22/2014  Time Reviewed:  9:45 AM  Progress in Treatment: Attending groups: Yes Participating in groups:  Yes Taking medication as prescribed:  Yes Tolerating medication:  Yes Family/Significant othe contact made: CSW assessing Patient understands diagnosis:  Yes Discussing patient identified problems/goals with staff:  Yes Medical problems stabilized or resolved:  Yes Denies suicidal/homicidal ideation: Yes Issues/concerns per patient self-inventory:  Yes Other:  New problem(s) identified: N/A  Discharge Plan or Barriers: [facility] and therapy.    Reason for Continuation of Hospitalization: Anxiety Depression Detox Medication Stabilization  Comments: N/A  Estimated length of stay: 1 day, d/c tomorrow  For review of initial/current patient goals, please see plan of care.  Attendees: Patient:     Family:     Physician:  Dr. Dub MikesLugo 03/22/2014 10:54 AM   Nursing:   Roswell Minersonna Shimp, RN 03/22/2014 10:54 AM   Clinical Social Worker:  Reyes Ivanhelsea Horton, LCSW 03/22/2014 10:54 AM   Other: Onnie BoerJennifer Clark, RN case manager 03/22/2014 10:54 AM   Other:  Joslyn Devonaroline Beaudry, RN 03/22/2014 10:54 AM   Other:  Serena ColonelAggie Nwoko, NP 03/22/2014 10:54 AM   Other:     Other:    Other:    Other:    Other:    Other:    Other:     Scribe for Treatment Team:   Carmina MillerHorton, Alfonzo Arca Nicole, 03/22/2014 , 10:54 AM

## 2014-03-22 NOTE — [facility] Group Notes (Signed)
[facility] LCSW Group Therapy  03/22/2014 2:52 PM  Type of Therapy:  Group Therapy  Participation Level:  Active  Participation Quality:  Attentive  Affect:  Appropriate  Cognitive:  Alert and Oriented  Insight:  Improving  Engagement in Therapy:  Engaged  Modes of Intervention:  Confrontation, Discussion, Education, Exploration, Problem-solving, Rapport Building, Socialization and Support  Summary of Progress/Problems: Emotion Regulation: This group focused on both positive and negative emotion identification and allowed group members to process ways to identify feelings, regulate negative emotions, and find healthy ways to manage internal/external emotions. Group members were asked to reflect on a time when their reaction to an emotion led to a negative outcome and explored how alternative responses using emotion regulation would have benefited them. Group members were also asked to discuss a time when emotion regulation was utilized when a negative emotion was experienced. [name] was attentive and engaged throughout today's therapy group. He shared that anger is an emotions that he struggles with. "I let things build up and tend to explode." [name] shows progress in the group setting and improving insight AEB his ability to process how "dealing with my feelings as they occur, reaching out for support in AA, and making sure I attend my therapy meetings" will help him learn skills and increase his support network to better manage his anger and anxiety.    Averill Winters Smart LCSWA  03/22/2014, 2:52 PM

## 2014-03-22 NOTE — Progress Notes (Signed)
Patient ID: Timothy MiresJeffrey L Hojnacki Jr., male   DOB: Sep 26, 1976, 38 y.o.   MRN: [id number] He has been up and to group. Interacting with peers and staff. Self inventory: depression 2, hopelessness 1, denies withdrawal symptoms and SI thoughts.

## 2014-03-22 NOTE — Progress Notes (Signed)
[facility] MD Progress Note  03/22/2014 1:38 PM [name].  MRN:  [id number] Subjective: [name] continues to be detox. States he realizes he has to stay on his medications. He states his mood is more stable today. He realizes that drinking and using cocaine is not the way to go. Committed to abstinence. Very ashamed and scared of his behavior in the ED. States he does not remember anything. States he is not a violent person.  Diagnosis:   DSM5: Schizophrenia Disorders:  none Obsessive-Compulsive Disorders:  none Trauma-Stressor Disorders:  none Substance/Addictive Disorders:  Alcohol Related Disorder - Severe (303.90), Cocaine related disorder Depressive Disorders:  Major Depressive Disorder - Moderate (296.22) Total Time spent with patient: 30 minutes  Axis I: Mood Disorder NOS and Substance Induced Mood Disorder  ADL's:  Intact  Sleep: Fair  Appetite:  Fair  Suicidal Ideation:  Plan:  denies Intent:  denies Means:  denies Homicidal Ideation:  Plan:  denies Intent:  denies Means:  denies AEB (as evidenced by):  Psychiatric Specialty Exam: Physical Exam  Review of Systems  Constitutional: Negative.   HENT: Negative.   Eyes: Negative.   Respiratory: Negative.   Gastrointestinal: Negative.   Genitourinary: Negative.   Musculoskeletal: Negative.   Skin: Negative.   Neurological: Negative.   Endo/Heme/Allergies: Negative.   Psychiatric/Behavioral: Positive for depression and substance abuse. The patient is nervous/anxious.     Blood pressure 126/80, pulse 74, temperature 97.7 F (36.5 C), temperature source Oral, resp. rate 18, height 6' (1.829 m), weight 91.627 kg (202 lb).Body mass index is 27.39 kg/(m^2).  General Appearance: Fairly Groomed  Patent attorney::  Fair  Speech:  Clear and Coherent  Volume:  Decreased  Mood:  Anxious and worried, ashamed, apologetic  Affect:  Appropriate  Thought Process:  Coherent and Goal Directed  Orientation:  Full (Time, Place, and  Person)  Thought Content:  symtpms, events, worries, concerns  Suicidal Thoughts:  No  Homicidal Thoughts:  No  Memory:  Immediate;   Fair Recent;   Fair Remote;   Fair  Judgement:  Fair  Insight:  Present  Psychomotor Activity:  Normal  Concentration:  Fair  Recall:  Poor for events at the ED  Fund of Knowledge:NA  Language: Fair  Akathisia:  No  Handed:    AIMS (if indicated):     Assets:  Desire for Improvement, Housing, Employed, Transportation  Sleep:  Number of Hours: 6.75   Musculoskeletal: Strength & Muscle Tone: within normal limits Gait & Station: normal Patient leans: N/A  Current Medications: Current Facility-Administered Medications  Medication Dose Route Frequency Provider Last Rate Last Dose  . chlordiazePOXIDE (LIBRIUM) capsule 25 mg  25 mg Oral Q6H PRN Rachael Fee, MD   25 mg at 03/21/14 1219  . chlordiazePOXIDE (LIBRIUM) capsule 25 mg  25 mg Oral TID Rachael Fee, MD       Followed by  . [START ON 03/23/2014] chlordiazePOXIDE (LIBRIUM) capsule 25 mg  25 mg Oral BH-qamhs Rachael Fee, MD       Followed by  . [START ON 03/24/2014] chlordiazePOXIDE (LIBRIUM) capsule 25 mg  25 mg Oral Daily Rachael Fee, MD      . DULoxetine (CYMBALTA) DR capsule 60 mg  60 mg Oral Daily Nanine Means, NP   60 mg at 03/22/14 0803  . hydrOXYzine (ATARAX/VISTARIL) tablet 25 mg  25 mg Oral Q6H PRN Nanine Means, NP      . hydrOXYzine (ATARAX/VISTARIL) tablet 25 mg  25  mg Oral Q6H PRN Rachael FeeIrving A Zya Finkle, MD      . ibuprofen (ADVIL,MOTRIN) tablet 800 mg  800 mg Oral Q6H PRN Nanine MeansJamison Lord, NP   800 mg at 03/22/14 1152  . insulin aspart (novoLOG) injection 0-9 Units  0-9 Units Subcutaneous TID WC Nanine MeansJamison Lord, NP   5 Units at 03/22/14 1154  . insulin glargine (LANTUS) injection 30 Units  30 Units Subcutaneous QHS Nanine MeansJamison Lord, NP   30 Units at 03/21/14 2128  . lamoTRIgine (LAMICTAL) tablet 100 mg  100 mg Oral Daily Nanine MeansJamison Lord, NP   100 mg at 03/22/14 0803  . loperamide (IMODIUM) capsule  2-4 mg  2-4 mg Oral PRN Rachael FeeIrving A Mikhi Athey, MD      . loratadine (CLARITIN) tablet 10 mg  10 mg Oral Daily Nanine MeansJamison Lord, NP   10 mg at 03/22/14 0803  . meloxicam (MOBIC) tablet 7.5 mg  7.5 mg Oral Daily Nanine MeansJamison Lord, NP   7.5 mg at 03/22/14 0803  . metFORMIN (GLUCOPHAGE) tablet 1,000 mg  1,000 mg Oral BID WC Nanine MeansJamison Lord, NP   1,000 mg at 03/22/14 0803  . multivitamin with minerals tablet 1 tablet  1 tablet Oral Daily Rachael FeeIrving A Shereka Lafortune, MD   1 tablet at 03/22/14 [id number]  . nicotine (NICODERM CQ - dosed in mg/24 hours) patch 21 mg  21 mg Transdermal Daily Rachael FeeIrving A Hristopher Missildine, MD   21 mg at 03/22/14 0804  . omega-3 acid ethyl esters (LOVAZA) capsule 1 g  1 g Oral Daily Nanine MeansJamison Lord, NP   1 g at 03/22/14 0803  . ondansetron (ZOFRAN-ODT) disintegrating tablet 4 mg  4 mg Oral Q6H PRN Rachael FeeIrving A Adilene Areola, MD      . pioglitazone (ACTOS) tablet 15 mg  15 mg Oral Daily Nanine MeansJamison Lord, NP   15 mg at 03/22/14 0803  . simvastatin (ZOCOR) tablet 40 mg  40 mg Oral QHS Nanine MeansJamison Lord, NP   40 mg at 03/21/14 2128  . thiamine (VITAMIN B-1) tablet 100 mg  100 mg Oral Daily Rachael FeeIrving A Marina Boerner, MD   100 mg at 03/22/14 0803  . traZODone (DESYREL) tablet 100 mg  100 mg Oral QHS Nanine MeansJamison Lord, NP   100 mg at 03/21/14 2128    Lab Results:  Results for orders placed during the hospital encounter of 03/20/14 (from the past 48 hour(s))  GLUCOSE, CAPILLARY     Status: Abnormal   Collection Time    03/20/14  5:02 PM      Result Value Ref Range   Glucose-Capillary 199 (*) 70 - 99 mg/dL   Comment 1 Notify RN    GLUCOSE, CAPILLARY     Status: Abnormal   Collection Time    03/20/14  8:44 PM      Result Value Ref Range   Glucose-Capillary 187 (*) 70 - 99 mg/dL  GLUCOSE, CAPILLARY     Status: Abnormal   Collection Time    03/21/14  6:25 AM      Result Value Ref Range   Glucose-Capillary 277 (*) 70 - 99 mg/dL  GLUCOSE, CAPILLARY     Status: Abnormal   Collection Time    03/21/14 12:01 PM      Result Value Ref Range   Glucose-Capillary 320 (*) 70 -  99 mg/dL  GLUCOSE, CAPILLARY     Status: Abnormal   Collection Time    03/21/14  5:04 PM      Result Value Ref Range   Glucose-Capillary 256 (*) 70 - 99  mg/dL  GLUCOSE, CAPILLARY     Status: Abnormal   Collection Time    03/21/14  9:21 PM      Result Value Ref Range   Glucose-Capillary 265 (*) 70 - 99 mg/dL  GLUCOSE, CAPILLARY     Status: Abnormal   Collection Time    03/22/14  6:24 AM      Result Value Ref Range   Glucose-Capillary 174 (*) 70 - 99 mg/dL  GLUCOSE, CAPILLARY     Status: Abnormal   Collection Time    03/22/14 11:53 AM      Result Value Ref Range   Glucose-Capillary 287 (*) 70 - 99 mg/dL    Physical Findings: AIMS: Facial and Oral Movements Muscles of Facial Expression: None, normal Lips and Perioral Area: None, normal Jaw: None, normal Tongue: None, normal,Extremity Movements Upper (arms, wrists, hands, fingers): None, normal Lower (legs, knees, ankles, toes): None, normal, Trunk Movements Neck, shoulders, hips: None, normal, Overall Severity Severity of abnormal movements (highest score from questions above): None, normal Incapacitation due to abnormal movements: None, normal Patient's awareness of abnormal movements (rate only patient's report): No Awareness, Dental Status Current problems with teeth and/or dentures?: No Does patient usually wear dentures?: No  CIWA:  CIWA-Ar Total: 2 COWS:  COWS Total Score: 0  Treatment Plan Summary: Daily contact with patient to assess and evaluate symptoms and progress in treatment Medication management  Plan: Supportive approach/copign skills/erlapse prevention           Continue the Detox           Reassess and address the co morbities  Medical Decision Making Problem Points:  Review of psycho-social stressors (1) Data Points:  Review of medication regiment & side effects (2) Review of new medications or change in dosage (2)  I certify that inpatient services furnished can reasonably be expected to improve  the patient's condition.   Rachael Feerving A Perry Brucato 03/22/2014, 1:38 PM

## 2014-03-23 DIAGNOSIS — F333 Major depressive disorder, recurrent, severe with psychotic symptoms: Principal | ICD-10-CM

## 2014-03-23 DIAGNOSIS — F1020 Alcohol dependence, uncomplicated: Secondary | ICD-10-CM

## 2014-03-23 LAB — GLUCOSE, CAPILLARY: Glucose-Capillary: 225 mg/dL — ABNORMAL HIGH (ref 70–99)

## 2014-03-23 MED ORDER — DULOXETINE HCL 60 MG PO CPEP: 60.0000 mg | ORAL_CAPSULE | Freq: Every day | ORAL | Status: DC

## 2014-03-23 MED ORDER — LAMOTRIGINE 100 MG PO TABS: 100.0000 mg | ORAL_TABLET | Freq: Every day | ORAL | Status: DC

## 2014-03-23 MED ORDER — HYDROXYZINE HCL 25 MG PO TABS: ORAL_TABLET | ORAL | Status: DC

## 2014-03-23 MED ORDER — HYDROXYZINE HCL 25 MG PO TABS
25.0000 mg | ORAL_TABLET | Freq: Three times a day (TID) | ORAL | Status: DC | PRN
  Filled 2014-03-23: qty 12

## 2014-03-23 MED ORDER — OMEGA-3-ACID ETHYL ESTERS 1 G PO CAPS
4.0000 g | ORAL_CAPSULE | Freq: Every day | ORAL | Status: DC
Start: 2014-03-23 — End: 2014-12-20

## 2014-03-23 MED ORDER — METFORMIN HCL 1000 MG PO TABS: 1000.0000 mg | ORAL_TABLET | Freq: Two times a day (BID) | ORAL | Status: DC

## 2014-03-23 MED ORDER — NABUMETONE 500 MG PO TABS: 500.0000 mg | ORAL_TABLET | Freq: Every day | ORAL | Status: DC

## 2014-03-23 MED ORDER — MELOXICAM 7.5 MG PO TABS: ORAL_TABLET | ORAL | Status: DC

## 2014-03-23 MED ORDER — CETIRIZINE HCL 10 MG PO TABS: 10.0000 mg | ORAL_TABLET | Freq: Every day | ORAL | Status: DC

## 2014-03-23 MED ORDER — SIMVASTATIN 40 MG PO TABS: 40.0000 mg | ORAL_TABLET | Freq: Every day | ORAL | Status: DC

## 2014-03-23 MED ORDER — INSULIN GLARGINE 100 UNIT/ML SOLOSTAR PEN: 30.0000 [IU] | PEN_INJECTOR | Freq: Every day | SUBCUTANEOUS | Status: DC

## 2014-03-23 MED ORDER — TRAZODONE HCL 100 MG PO TABS: 100.0000 mg | ORAL_TABLET | Freq: Every day | ORAL | Status: DC

## 2014-03-23 MED ORDER — PIOGLITAZONE HCL 15 MG PO TABS: 15.0000 mg | ORAL_TABLET | Freq: Every day | ORAL | Status: DC

## 2014-03-23 NOTE — Discharge Summary (Signed)
Physician Discharge Summary Note  Patient:  Timothy MiresJeffrey L Pimenta Jr. is an 38 y.o., male MRN:  [id number] DOB:  1976/10/02 Patient phone:  [phone] (home)  Patient address:   [address] [place] [id number],  Total Time spent with patient: Greater than 30 minutes  Date of Admission:  03/20/2014 Date of Discharge: 03/24/14  Reason for Admission:  Alcohol dependence  Discharge Diagnoses: Active Problems:   Cocaine abuse   Major depressive disorder, recurrent episode, severe, specified as with psychotic behavior   Unspecified episodic mood disorder   Alcohol dependence   Psychiatric Specialty Exam: Physical Exam  Psychiatric: His speech is normal and behavior is normal. Judgment and thought content normal. His mood appears not anxious. His affect is not angry, not blunt, not labile and not inappropriate. Cognition and memory are normal. He does not exhibit a depressed mood.    Review of Systems  Constitutional: Negative.   HENT: Negative.   Eyes: Negative.   Respiratory: Negative.   Cardiovascular: Negative.   Gastrointestinal: Negative.   Genitourinary: Negative.   Musculoskeletal: Negative.   Skin: Negative.   Neurological: Negative.   Endo/Heme/Allergies: Negative.   Psychiatric/Behavioral: Positive for depression (Stabilized with mdication prior to discharge) and substance abuse (Alcohol dependence). Negative for suicidal ideas, hallucinations and memory loss. The patient has insomnia (Stabilized with medication prior to discharge). The patient is not nervous/anxious.     Blood pressure 145/81, pulse 112, temperature 98.2 F (36.8 C), temperature source Oral, resp. rate 17, height 6' (1.829 m), weight 91.627 kg (202 lb).Body mass index is 27.39 kg/(m^2).   General Appearance: Fairly Groomed   Patent attorneyye Contact:: Fair   Speech: Clear and Coherent   Volume: Normal   Mood: Euthymic   Affect: Appropriate   Thought Process: Coherent and Goal Directed    Orientation: Full (Time, Place, and Person)   Thought Content: relapse prevention plan   Suicidal Thoughts: No   Homicidal Thoughts: No   Memory: Immediate; Fair  Recent; Fair  Remote; Fair   Judgement: Fair   Insight: Present   Psychomotor Activity: Normal   Concentration: Fair   Recall: [employer]Fair   Fund of Knowledge:NA   Language: Fair   Akathisia: No   Handed: Right   AIMS (if indicated):   Assets: Desire for Improvement  Housing  Social Support  Talents/Skills  Transportation   Sleep: Number of Hours: 6.75    Past Psychiatric History: Diagnosis: Alcohol dependence, Major depressive disorder, recurrent episode, severe, specified as with psychotic behavior  Hospitalizations: [facility] adult unit  [facility]  [facility]  Self-Mutilation: NA  Suicidal Attempts: NA  Violent Behaviors: NA   Musculoskeletal: Strength & Muscle Tone: within normal limits Gait & Station: normal Patient leans: N/A  DSM5: Schizophrenia Disorders:  NA Obsessive-Compulsive Disorders:  NA Trauma-Stressor Disorders:  NA Substance/Addictive Disorders:  Alcohol Related Disorder - Severe (303.90), Cocaine abuse Depressive Disorders:  Major depressive disorder, recurrent episode, severe, specified as with psychotic behavior  Axis Diagnosis:  AXIS I:  Alcohol dependence, Major depressive disorder, recurrent episode, severe, specified as with psychotic behavior AXIS II:  Deferred AXIS III:   Past Medical History  Diagnosis Date  . Allergy   . Depression   . GERD (gastroesophageal reflux disease)   . Hyperlipidemia   . Chronic pain disorder     Sees [facility]  . Urinary incontinence     detrusor instability  . Hypogonadism   .  Diabetes mellitus     Type II  . Chronic neck pain    AXIS IV:  other psychosocial or environmental problems and Alcoholism, chronic AXIS V:  62  Level of Care:  OP  Hospital Course:   38 Y/O male who states that he stop taking his medication and started experiencing "the ups and downs and then back to the black out spells". Started drinking 2 weeks ago. States he does not remember using cocaine although the drug screen is positive. He was taking lamictal 50 mg and he states during the last visit it was increased to 100 mg. States that the 100 mg was making him more sedated so rather than going back down to 50 mg he quit taking it altogether. States that shortly after that he started having problems with his mood.  [name] was admitted to the hospital with a blood alcohol level of 149 per toxicology reports and his UDS report positive for Cocaine. He was intoxicated requiring detoxification treatment. Prior to hospitalization, [name] had stopped taking his medication for mood stabilization for his mental health issues. He received Librium detox protocols for his alcohol detox treatment. And to re-stabilize his mood, [name] was ordered and received Cymbalta 60 mg daily for depression, Hydroxyzine 25 mg three times daily as needed for anxiety and Trazodone 100 mg Q bedtime for sleep. He was also enrolled in the group counseling sessions and AA/NA meetings being offered on this unit. He learned coping skills. [name] was resumed on all his pertinent home medications for his other pre-existing medical issues. He tolerated his treatment regimen without any adverse effects and or reactions.  [name] has completed detox treatment. His mood also is stable. He is currently being discharged to [facility] on [address] He is provided with all the necessary information required to make this appointment without problems. Upon discharge, he adamantly denies any SIHI, AVH, delusions, paranoia and or withdrawal symptoms. [facility] 4 [facility]. [facility] in no distress. Transportation per family.  Consults:  psychiatry  Significant Diagnostic Studies:  labs: CBC with diff, CMP, UDS, toxicology tests, U/A  Discharge Vitals:   Blood pressure 145/81, pulse 112, temperature 98.2 F (36.8 C), temperature source Oral, resp. rate 17, height 6' (1.829 m), weight 91.627 kg (202 lb). Body mass index is 27.39 kg/(m^2). Lab Results:   Results for orders placed during the hospital encounter of 03/20/14 (from the past 72 hour(s))  GLUCOSE, CAPILLARY     Status: Abnormal   Collection Time    03/20/14  5:02 PM      Result Value Ref Range   Glucose-Capillary 199 (*) 70 - 99 mg/dL   Comment 1 Notify RN    GLUCOSE, CAPILLARY     Status: Abnormal   Collection Time    03/20/14  8:44 PM      Result Value Ref Range   Glucose-Capillary 187 (*) 70 - 99 mg/dL  GLUCOSE, CAPILLARY     Status: Abnormal   Collection Time    03/21/14  6:25 AM      Result Value Ref Range   Glucose-Capillary 277 (*) 70 - 99 mg/dL  GLUCOSE, CAPILLARY     Status: Abnormal   Collection Time    03/21/14 12:01 PM      Result Value Ref Range   Glucose-Capillary 320 (*)  70 - 99 mg/dL  GLUCOSE, CAPILLARY     Status: Abnormal   Collection Time    03/21/14  5:04 PM      Result Value Ref Range   Glucose-Capillary 256 (*) 70 - 99 mg/dL  GLUCOSE, CAPILLARY     Status: Abnormal   Collection Time    03/21/14  9:21 PM      Result Value Ref Range   Glucose-Capillary 265 (*) 70 - 99 mg/dL  GLUCOSE, CAPILLARY     Status: Abnormal   Collection Time    03/22/14  6:24 AM      Result Value Ref Range   Glucose-Capillary 174 (*) 70 - 99 mg/dL  GLUCOSE, CAPILLARY     Status: Abnormal   Collection Time    03/22/14 11:53 AM      Result Value Ref Range   Glucose-Capillary 287 (*) 70 - 99 mg/dL  GLUCOSE, CAPILLARY     Status: Abnormal   Collection Time    03/22/14  5:02 PM      Result Value Ref Range   Glucose-Capillary 266 (*) 70 - 99 mg/dL  GLUCOSE,  CAPILLARY     Status: Abnormal   Collection Time    03/22/14  8:47 PM      Result Value Ref Range   Glucose-Capillary 199 (*) 70 - 99 mg/dL  GLUCOSE, CAPILLARY     Status: Abnormal   Collection Time    03/23/14  6:11 AM      Result Value Ref Range   Glucose-Capillary 225 (*) 70 - 99 mg/dL    Physical Findings: AIMS: Facial and Oral Movements Muscles of Facial Expression: None, normal Lips and Perioral Area: None, normal Jaw: None, normal Tongue: None, normal,Extremity Movements Upper (arms, wrists, hands, fingers): None, normal Lower (legs, knees, ankles, toes): None, normal, Trunk Movements Neck, shoulders, hips: None, normal, Overall Severity Severity of abnormal movements (highest score from questions above): None, normal Incapacitation due to abnormal movements: None, normal Patient's awareness of abnormal movements (rate only patient's report): No Awareness, Dental Status Current problems with teeth and/or dentures?: No Does patient usually wear dentures?: No  CIWA:  CIWA-Ar Total: 4 COWS:  COWS Total Score: 0  Psychiatric Specialty Exam: See Psychiatric Specialty Exam and Suicide Risk Assessment completed by Attending Physician prior to discharge.  Discharge destination:  Home  Is patient on multiple antipsychotic therapies at discharge:  No   Has Patient had three or more failed trials of antipsychotic monotherapy by history:  No  Recommended Plan for Multiple Antipsychotic Therapies: NA     Medication List    STOP taking these medications       acetaminophen 500 MG tablet  Commonly known as:  TYLENOL     cyclobenzaprine 5 MG tablet  Commonly known as:  FLEXERIL     oxyCODONE-acetaminophen 5-325 MG per tablet  Commonly known as:  PERCOCET      TAKE these medications     Indication   cetirizine 10 MG tablet  Commonly known as:  ZYRTEC  Take 1 tablet (10 mg total) by mouth daily. For allergies   Indication:  Perennial Rhinitis, Hayfever      DULoxetine 60 MG capsule  Commonly known as:  CYMBALTA  Take 1 capsule (60 mg total) by mouth daily. For depression   Indication:  Generalized Anxiety Disorder, Major Depressive Disorder, Musculoskeletal Pain     hydrOXYzine 25 MG tablet  Commonly known as:  ATARAX/VISTARIL  Take 1 tablet (25 mg) three  times daily as needed for anxiety   Indication:  Tension, Anxiety     Insulin Glargine 100 UNIT/ML Solostar Pen  Commonly known as:  LANTUS  Inject 30 Units into the skin at bedtime. For diabetes management   Indication:  Type 2 Diabetes     lamoTRIgine 100 MG tablet  Commonly known as:  LAMICTAL  Take 1 tablet (100 mg total) by mouth daily. For mood stabilization   Indication:  Mood stabilization     meloxicam 7.5 MG tablet  Commonly known as:  MOBIC  TAKE 1 TABLET BY MOUTH EVERY DAY: For arthritis   Indication:  Joint Damage causing Pain and Loss of Function     metFORMIN 1000 MG tablet  Commonly known as:  GLUCOPHAGE  Take 1 tablet (1,000 mg total) by mouth 2 (two) times daily with a meal. For diabetes management   Indication:  Type 2 Diabetes     nabumetone 500 MG tablet  Commonly known as:  RELAFEN  Take 1 tablet (500 mg total) by mouth daily. For arthritic pain   Indication:  Joint Damage causing Pain and Loss of Function     omega-3 acid ethyl esters 1 G capsule  Commonly known as:  LOVAZA  Take 4 capsules (4 g total) by mouth daily. For high cholesterol/fats   Indication:  High Amount of Triglycerides in the Blood     pioglitazone 15 MG tablet  Commonly known as:  ACTOS  Take 1 tablet (15 mg total) by mouth daily. For diabetes management   Indication:  Type 2 Diabetes     simvastatin 40 MG tablet  Commonly known as:  ZOCOR  Take 1 tablet (40 mg total) by mouth at bedtime. For high cholesterol   Indication:  Inherited Homozygous Hypercholesterolemia     traZODone 100 MG tablet  Commonly known as:  DESYREL  Take 1 tablet (100 mg total) by mouth at bedtime. For  sleep   Indication:  Trouble Sleeping       Follow-up Information   Follow up with [facility] On 03/30/2014. (Appointment for hospital followup with Dr. Lolly Mustache at 10:00AM. )    Contact information:   [address] [place], [place] [address] Phone: [phone] Fax: [phone]      Follow up with [facility] On 05/26/2014. (Appointment for therapy at 2:00PM with [facility]. If you want to see a therapist sooner, please call [phone] to reschedule. They will have to put you with a different therapist.  )    Contact information:   [address] [place], [place] [address] Phone: [phone] Fax: [phone]     Follow-up recommendations: Activity:  As tolerated Diet: As recommended by your primary care doctor. Keep all scheduled follow-up appointments as recommended.    Comments: Take all your medications as prescribed by your mental healthcare provider. Report any adverse effects and or reactions from your medicines to your outpatient provider promptly. Patient is instructed and cautioned to not engage in alcohol and or illegal drug use while on prescription medicines. In the event of worsening symptoms, patient is instructed to call the crisis hotline, 911 and or go to the nearest ED for appropriate evaluation and treatment of symptoms. Follow-up with your primary care provider for your other medical issues, concerns and or health care needs.    Total Discharge Time:  Greater than 30 minutes.  Signed: Sanjuana Kava, PMHNP-BC 03/23/2014, 9:28 AM Personally evaluated the patient and agree with assessment and plan Madie Reno A. [place],  M.D. 

## 2014-03-23 NOTE — [facility] Suicide Risk Assessment (Signed)
Suicide Risk Assessment  Discharge Assessment     Demographic Factors:  Caucasian  Total Time spent with patient: 45 minutes  Psychiatric Specialty Exam:     Blood pressure 145/81, pulse 112, temperature 98.2 F (36.8 C), temperature source Oral, resp. rate 17, height 6' (1.829 m), weight 91.627 kg (202 lb).Body mass index is 27.39 kg/(m^2).  General Appearance: Fairly Groomed  Patent attorneyye Contact::  Fair  Speech:  Clear and Coherent  Volume:  Normal  Mood:  Euthymic  Affect:  Appropriate  Thought Process:  Coherent and Goal Directed  Orientation:  Full (Time, Place, and Person)  Thought Content:  relapse prevention plan  Suicidal Thoughts:  No  Homicidal Thoughts:  No  Memory:  Immediate;   Fair Recent;   Fair Remote;   Fair  Judgement:  Fair  Insight:  Present  Psychomotor Activity:  Normal  Concentration:  Fair  Recall:  FiservFair  Fund of Knowledge:NA  Language: Fair  Akathisia:  No  Handed:  Right  AIMS (if indicated):     Assets:  Desire for Improvement Housing Social Support Talents/Skills Transportation  Sleep:  Number of Hours: 6.75    Musculoskeletal: Strength & Muscle Tone: within normal limits Gait & Station: normal Patient leans: N/A   Mental Status Per Nursing Assessment::   On Admission:     Current Mental Status by Physician: In full contact with reality. No active S/S of withdrawal. He is willing and motivated to pursue outpatient follow up and stay on his medications   Loss Factors: Legal issues  Historical Factors: NA  Risk Reduction Factors:   Responsible for children under 40 years of age, Sense of responsibility to family, Employed, Living with another person, especially a relative, Positive social support and Positive therapeutic relationship  Continued Clinical Symptoms:  Depression:   Comorbid alcohol abuse/dependence Alcohol/Substance Abuse/Dependencies  Cognitive Features That Contribute To Risk:  Polarized thinking Thought  constriction (tunnel vision)    Suicide Risk:  Minimal: No identifiable suicidal ideation.  Patients presenting with no risk factors but with morbid ruminations; may be classified as minimal risk based on the severity of the depressive symptoms  Discharge Diagnoses:   AXIS I:  Alcohol Dependence, Cocaine Abuse, Bipolar Disorder  AXIS II:  No diagnosis AXIS III:   Past Medical History  Diagnosis Date  . Allergy   . Depression   . GERD (gastroesophageal reflux disease)   . Hyperlipidemia   . Chronic pain disorder     Sees [facility]  . Urinary incontinence     detrusor instability  . Hypogonadism   . Diabetes mellitus     Type II  . Chronic neck pain    AXIS IV:  problems related to legal system/crime AXIS V:  61-70 mild symptoms  Plan Of Care/Follow-up recommendations:  Activity:  as tolerated Diet:  regular Follow Up [facility] Is patient on multiple antipsychotic therapies at discharge:  No   Has Patient had three or more failed trials of antipsychotic monotherapy by history:  No  Recommended Plan for Multiple Antipsychotic Therapies: NA    Rachael FeeIrving A Tisa Weisel 03/23/2014, 8:54 AM

## 2014-03-23 NOTE — Progress Notes (Signed)
[facility] :  Will you be returning to the same living situation after discharge: Yes,  returning home At discharge, do you have transportation home?:Yes,  family will pick pt up Do you have the ability to pay for your medications:Yes,  provided pt with prescriptions and pt verbalizes ability to afford meds  Release of information consent forms completed and in the chart;  Patient's signature needed at discharge.  Patient to Follow up at: Follow-up Information   Follow up with [facility] On 03/30/2014. (Appointment for hospital followup with Dr. Lolly MustacheArfeen at 10:00AM. )    Contact information:   [address] [place], [place] [address] Phone: [phone] Fax: [phone]      Follow up with [facility] On 05/26/2014. ([facility] If you want to see a therapist sooner, please call [phone] to reschedule. They will have to put you with a different therapist.  )    Contact information:   [address] [place], [place] [address] Phone: [phone] Fax: [phone]      Patient denies SI/HI:   Yes,  denies SI/HI    Safety Planning and Suicide Prevention discussed:  Yes,  discussed with pt.  Unable to reach pt's wife.  See suicide prevention education note.   [name] 03/23/2014, 8:27 AM

## 2014-03-23 NOTE — Progress Notes (Signed)
D: Pt presents with a flat affect this evening. Pt reports the following withdrawal symptoms: sweating, shakiness (mild), and anxiety. Pt is denying any SI/HI. Pt reports no AVH. [name] cbg was 199 this evening. Pt is present within the milieu this evening.  A: Writer administered scheduled and prn medications to pt. Continued support and availability as needed was extended to this pt. Staff continue to monitor pt with q6115min checks.  R: No adverse drug reactions noted. Pt receptive to treatment. Pt remains safe at this time.

## 2014-03-24 ENCOUNTER — Other Ambulatory Visit: Payer: Self-pay | Admitting: Family

## 2014-03-27 ENCOUNTER — Ambulatory Visit (HOSPITAL_COMMUNITY): Payer: Self-pay | Admitting: Psychiatry

## 2014-03-28 NOTE — Progress Notes (Signed)
Patient Discharge Instructions:  Next Level Care Provider Has Access to the EMR, 03/28/14 [facility] access.  Jerelene ReddenSheena E [place], 03/28/2014, 3:02 PM

## 2014-03-30 ENCOUNTER — Ambulatory Visit (HOSPITAL_COMMUNITY): Payer: Self-pay | Admitting: Psychiatry

## 2014-03-31 ENCOUNTER — Other Ambulatory Visit: Payer: Self-pay | Admitting: Family

## 2014-04-04 ENCOUNTER — Ambulatory Visit (HOSPITAL_COMMUNITY): Payer: Self-pay | Admitting: Psychiatry

## 2014-04-07 ENCOUNTER — Other Ambulatory Visit: Payer: Self-pay | Admitting: Family

## 2014-04-07 NOTE — Telephone Encounter (Signed)
Medication name:  Name from pharmacy:  meloxicam (MOBIC) 7.5 MG tablet  MELOXICAM 7.5 MG TABLET Sig: TAKE 1 TABLET (7.5 MG TOTAL) BY MOUTH DAILY. Dispense: 14 tablet Refills: 0 Start: 04/07/2014 Class: Normal Requested on: 03/26/2014 Originally ordered on: 11/16/2013 Last refill: 03/26/2014

## 2014-04-09 ENCOUNTER — Other Ambulatory Visit (HOSPITAL_COMMUNITY): Payer: Self-pay | Admitting: Psychiatry

## 2014-04-10 ENCOUNTER — Other Ambulatory Visit: Payer: Self-pay | Admitting: Family

## 2014-04-12 ENCOUNTER — Other Ambulatory Visit (HOSPITAL_COMMUNITY): Payer: Self-pay | Admitting: Psychiatry

## 2014-04-12 NOTE — Telephone Encounter (Signed)
Patient has not seen in office since January. Needs to be seen.

## 2014-04-14 ENCOUNTER — Other Ambulatory Visit: Payer: Self-pay | Admitting: Family

## 2014-04-14 NOTE — Telephone Encounter (Signed)
Refills sent

## 2014-04-14 NOTE — Telephone Encounter (Signed)
OK to refill

## 2014-04-14 NOTE — Telephone Encounter (Signed)
Pt last saw [name] in 01/31/14. Are we refilling his insulin or is this supposed to be coming from endocrinology?

## 2014-04-19 ENCOUNTER — Telehealth: Payer: Self-pay | Admitting: *Deleted

## 2014-04-19 ENCOUNTER — Other Ambulatory Visit (HOSPITAL_COMMUNITY): Payer: Self-pay

## 2014-04-19 MED ORDER — DULOXETINE HCL 60 MG PO CPEP: 60.0000 mg | ORAL_CAPSULE | Freq: Every day | ORAL | Status: DC

## 2014-04-19 MED ORDER — TRAZODONE HCL 100 MG PO TABS: 100.0000 mg | ORAL_TABLET | Freq: Every day | ORAL | Status: DC

## 2014-04-19 NOTE — Telephone Encounter (Signed)
Received order from [facility] for diabetic testing supplies. Form completed and faxed to [phone].

## 2014-04-20 NOTE — Progress Notes (Signed)
This encounter was created in error - please disregard.  This encounter was created in error - please disregard.

## 2014-04-21 ENCOUNTER — Other Ambulatory Visit: Payer: Self-pay | Admitting: Family

## 2014-04-21 ENCOUNTER — Ambulatory Visit (INDEPENDENT_AMBULATORY_CARE_PROVIDER_SITE_OTHER): Payer: PRIVATE HEALTH INSURANCE | Admitting: Family

## 2014-04-21 ENCOUNTER — Encounter: Payer: Self-pay | Admitting: Family

## 2014-04-21 VITALS — BP 124/70 | HR 87 | Temp 97.8°F | Resp 16 | Ht 72.0 in | Wt 211.1 lb

## 2014-04-21 DIAGNOSIS — F333 Major depressive disorder, recurrent, severe with psychotic symptoms: Secondary | ICD-10-CM

## 2014-04-21 DIAGNOSIS — R21 Rash and other nonspecific skin eruption: Secondary | ICD-10-CM

## 2014-04-21 DIAGNOSIS — E291 Testicular hypofunction: Secondary | ICD-10-CM

## 2014-04-21 DIAGNOSIS — M542 Cervicalgia: Secondary | ICD-10-CM

## 2014-04-21 MED ORDER — TIZANIDINE HCL 4 MG PO TABS: 4.0000 mg | ORAL_TABLET | Freq: Two times a day (BID) | ORAL | Status: DC | PRN

## 2014-04-21 MED ORDER — MELOXICAM 7.5 MG PO TABS: ORAL_TABLET | ORAL | Status: DC

## 2014-04-21 MED ORDER — CYCLOBENZAPRINE HCL 5 MG PO TABS: 5.0000 mg | ORAL_TABLET | Freq: Two times a day (BID) | ORAL | Status: DC | PRN

## 2014-04-21 MED ORDER — CEPHALEXIN 500 MG PO CAPS: 500.0000 mg | ORAL_CAPSULE | Freq: Four times a day (QID) | ORAL | Status: DC

## 2014-04-21 NOTE — Patient Instructions (Signed)
Start keflex for neck rash. Call if symptoms worsen or if they do not improve. Follow up in 2 months.

## 2014-04-21 NOTE — Progress Notes (Signed)
Subjective:    Patient ID: Timothy MiresJeffrey L Stensland Jr., male    DOB: [birth year], [age] y.o.   MRN: [id number]  HPI  Mr. [name] is a 38 yr old male who presents today with chief complaint of rash. Rash is located on his neck and has been there for a few months.  Not itching, but "burns when I sweat." Has tried some otc creams without improvement.   Of note, since his last visit here he has had an inpatient behavioral health admission. He was discharged on 5/14 and arranged to follow up with [facility]- Dr. Lolly [name].  He reports that he rescheduled his appointment. Started working at [employer].    Had testosterone implant placed by Dr. Lesia SagoStonekings.   Neck Pain- using meloxicam and methocarbamol (was told that his insurance will not cover methocarbamol but will cover flexeril).  He has recently started working and is pleased about this.  Review of Systems    see HPI  Past Medical History  Diagnosis Date  . Allergy   . Depression   . GERD (gastroesophageal reflux disease)   . Hyperlipidemia   . Chronic pain disorder     Sees [facility]  . Urinary incontinence     detrusor instability  . Hypogonadism   . Diabetes mellitus     Type II  . Chronic neck pain     History   Social History  . Marital Status: Married    Spouse Name: N/A    Number of Children: 2  . Years of Education: N/A   Occupational History  . Disabled     chronic pain   Social History Main Topics  . Smoking status: Current Every Day Smoker -- 1.50 packs/day    Types: Cigarettes    Last Attempt to Quit: 08/29/2013  . Smokeless tobacco: Never Used  . Alcohol Use: Yes  . Drug Use: Yes    Special: Cocaine     Comment: stopped cocaine--2003. Normal drug screen March 2008  . Sexual Activity: Yes    Birth Control/ Protection: None   Other Topics Concern  . Not on file   Social History Narrative  . No narrative on file    Past Surgical History  Procedure Laterality Date  . Appendectomy     . Hernia repair    . Nasal sinus surgery  2008    Family History  Problem Relation Age of Onset  . Diabetes Mother   . Hypertension Mother   . Cirrhosis Mother   . Depression Mother   . Bipolar disorder Mother   . Arthritis Father 49    osteoarthritis  . Bipolar disorder Father   . Diabetes Sister     borderline  . Heart disease Paternal Uncle [id number]    MI  . Heart disease Maternal Grandfather     late 60's--MI  . Cancer Paternal Grandmother [id number]    lung  . Bipolar disorder Paternal Grandmother   . Heart disease Paternal Grandfather [phone]    MI  . Bipolar disorder Paternal Grandfather     Allergies  Allergen Reactions  . Ciprofloxacin Hives  . Hydrocodone     Rash and nausea    Current Outpatient Prescriptions on File Prior to Visit  Medication Sig Dispense Refill  . cetirizine (ZYRTEC) 10 MG tablet Take 1 tablet (10 mg total) by mouth daily. For allergies      . DULoxetine (CYMBALTA) 60 MG capsule Take 1 capsule (60 mg total) by mouth daily.  For depression  30 capsule  0  . hydrOXYzine (ATARAX/VISTARIL) 25 MG tablet Take 1 tablet (25 mg) three times daily as needed for anxiety  60 tablet  0  . lamoTRIgine (LAMICTAL) 100 MG tablet Take 1 tablet (100 mg total) by mouth daily. For mood stabilization  30 tablet  0  . meloxicam (MOBIC) 7.5 MG tablet TAKE 1 TABLET (7.5 MG TOTAL) BY MOUTH DAILY.  14 tablet  0  . metFORMIN (GLUCOPHAGE) 1000 MG tablet TAKE 1 TABLET BY MOUTH TWICE A DAY AS DIRECTED  60 tablet  3  . omega-3 acid ethyl esters (LOVAZA) 1 G capsule Take 4 capsules (4 g total) by mouth daily. For high cholesterol/fats      . pioglitazone (ACTOS) 15 MG tablet Take 1 tablet (15 mg total) by mouth daily. For diabetes management  30 tablet  5  . simvastatin (ZOCOR) 40 MG tablet Take 1 tablet (40 mg total) by mouth at bedtime. For high cholesterol  30 tablet  5  . traZODone (DESYREL) 100 MG tablet Take 1 tablet (100 mg total) by mouth at bedtime. For sleep  30 tablet  0  .  [DISCONTINUED] albuterol (PROVENTIL) 90 MCG/ACT inhaler Inhale 2 puffs into the lungs every 6 (six) hours as needed for wheezing.  17 g  12   No current facility-administered medications on file prior to visit.    BP 124/70  Pulse 87  Temp(Src) 97.8 F (36.6 C) (Oral)  Resp 16  Ht 6' (1.829 m)  Wt 211 lb 1.3 oz (95.745 kg)  BMI 28.62 kg/m2  SpO2 99%    Objective:   Physical Exam  Constitutional: He is oriented to person, place, and time. He appears well-developed and well-nourished.  Neurological: He is alert and oriented to person, place, and time.  Skin:  Raised non-tender small pea sized papules noted at posterior base of neck.   Psychiatric: He has a normal mood and affect. His behavior is normal. Judgment and thought content normal.          Assessment & Plan:

## 2014-04-21 NOTE — Progress Notes (Signed)
Pre visit review using our clinic review tool, if applicable. No additional management support is needed unless otherwise documented below in the visit note. 

## 2014-04-21 NOTE — Telephone Encounter (Signed)
Received fax from pharmacy that cyclobenzaprine not covered. They are recommending Tizanidine. Cancel Cyclobenzaprine, see rx pended below.

## 2014-04-21 NOTE — Assessment & Plan Note (Signed)
Refill provided for meloxicam and rx sent for flexeril as this is on his formulary and methocarbamol is not.

## 2014-04-21 NOTE — Telephone Encounter (Signed)
Tizanidine rx sent to pharmacy with note to d/c cyclobenzaprine rx.

## 2014-04-21 NOTE — Assessment & Plan Note (Signed)
Advised pt to keep upcoming apt with Dr. Lolly MustacheArfeen.

## 2014-04-21 NOTE — Assessment & Plan Note (Signed)
Better now that he has had testosterone implant.  Management per urology.

## 2014-04-22 ENCOUNTER — Telehealth: Payer: Self-pay | Admitting: Family

## 2014-04-22 NOTE — Telephone Encounter (Signed)
Relevant patient education assigned to patient using Emmi. ° °

## 2014-05-02 ENCOUNTER — Telehealth: Payer: Self-pay | Admitting: Family

## 2014-05-02 ENCOUNTER — Ambulatory Visit (INDEPENDENT_AMBULATORY_CARE_PROVIDER_SITE_OTHER): Payer: 59 | Admitting: Psychiatry

## 2014-05-02 ENCOUNTER — Encounter (HOSPITAL_COMMUNITY): Payer: Self-pay | Admitting: Psychiatry

## 2014-05-02 VITALS — BP 128/73 | HR 107 | Ht 73.0 in | Wt 214.8 lb

## 2014-05-02 DIAGNOSIS — F319 Bipolar disorder, unspecified: Secondary | ICD-10-CM

## 2014-05-02 DIAGNOSIS — R21 Rash and other nonspecific skin eruption: Secondary | ICD-10-CM

## 2014-05-02 MED ORDER — BETAMETHASONE DIPROPIONATE 0.05 % EX CREA: TOPICAL_CREAM | Freq: Two times a day (BID) | CUTANEOUS | Status: DC | PRN

## 2014-05-02 MED ORDER — TRAZODONE HCL 100 MG PO TABS: 100.0000 mg | ORAL_TABLET | Freq: Every day | ORAL | Status: DC

## 2014-05-02 MED ORDER — LAMOTRIGINE 100 MG PO TABS: 100.0000 mg | ORAL_TABLET | Freq: Every day | ORAL | Status: DC

## 2014-05-02 MED ORDER — DULOXETINE HCL 60 MG PO CPEP: 60.0000 mg | ORAL_CAPSULE | Freq: Every day | ORAL | Status: DC

## 2014-05-02 NOTE — Telephone Encounter (Signed)
Please advise 

## 2014-05-02 NOTE — Progress Notes (Signed)
[facility] [id number] Progress Note  [name] [id number] 38 y.o.  05/02/2014 1:38 PM  Chief Complaint:  Medication management and followup.      History of Present Illness:  [name] came for his followup appointment .  He was last seen on January 28 .  He was [facility] on [facility] at discharge on May 15.  Patient admitted that he stopped taking his Lamictal because he felt that Lamictal causing him very tired .  Soon after he stopped the medicine has started to decompensate with mood irritability, anger, agitation and anger.  He admitted relapse into drinking .  He was seen in the emergency room and then later transferred to [facility] for inpatient services.  At this time his blood alcohol level was 149 and his UDS is positive for cocaine but patient denied using cocaine.  His medications were restarted.  He admitted that his low energy level was not from Lamictal .  He mentioned that it was low testosterone and his energy level get much better than he was given testosterone implant.  He denies any drinking since he is released from the hospital.  He is not using drugs.  He is working full-time at [employer] and keeping himself busy.  He denies any racing thoughts, panic attack or any nervousness.  He is not taking any benzodiazepine or any narcotic pain medication.  He is prescribed Vistaril but he does not take every day because he is feeling much better.  He is compliant with Lamictal 100 mg daily and Cymbalta 60 mg daily.  He is taking Tylenol at bedtime it is helping his sleep.  He is more social and active.  His appetite is okay.  Overall he has lost more than 10 pounds since January.  His vitals are stable.  He denies any nightmares or any flashback.  He denies any mania.  He was recently seen his primary care physician for a rash at his neck.  Patient told it was folliculitis which may have caused after his hair cut.  He was given  antibiotic which he finished.  He mentioned that he may need to see a dermatologist if rash does not resolve.  Patient has taken Lamictal in the past up to 100 mg without any rash and he does not believe it has caused by the medication.  Patient also missed appointment to see [name] rescheduled to see on July 17 .  Patient denies any paranoia, hallucination or any suicidal thoughts.  He had a history of benzodiazepine dependence, alcohol use and drug use.  Patient lives with his wife and 2 children.  Suicidal Ideation: No Plan Formed: No Patient has means to carry out plan: No  Homicidal Ideation: No Plan Formed: No Patient has means to carry out plan: No  Past Psychiatric History/Hospitalization(s) Patient has multiple psychiatric hospital admission in his life.  His last psychiatric hospitalization was Mar 20, 2014 to Mar 24, 2014.  He was not taking his Lamictal and relapsed into drinking.  His blood alcohol level was 149 and his UDS is positive for cocaine.  In the past he has hospitalization at [facility], [facility] and [facility].  He has history of suicidal thinking and overdose on Klonopin.  He endorsed history of paranoia, anger , substance use , mood swings anger and irritability.  He had tried Depakote, BuSpar, Risperdal, Prozac in recent months but stopped because of the side effects.  He endorsed increased prolactin level which also Risperdal .  He has history of using Xanax, Valium and Klonopin.   Anxiety: Yes Bipolar Disorder: Yes Depression: Yes Mania: Yes Psychosis: Yes Schizophrenia: No Personality Disorder: No Hospitalization for psychiatric illness: Yes History of Electroconvulsive Shock Therapy: No Prior Suicide Attempts: Yes  Medical History; Patient has multiple medical problems.  He has hypertension, diabetes mellitus , asthma, GERD, chronic pain, degenerative disc disease, fibromyalgia, low testosterone, hyperlipidemia and allergic rhinitis.  His  primary care physician is Dr. Lendell Caprice and he see Dr. Lucianne Muss for her diabetes.  Review of Systems: Psychiatric: Agitation: No Hallucination: No Depressed Mood: No Insomnia: Yes Hypersomnia: No Altered Concentration: No Feels Worthless: No Grandiose Ideas: No Belief In Special Powers: No New/Increased Substance Abuse: No Compulsions: No  Neurologic: Headache: No Seizure: No Paresthesias: No    Outpatient Encounter Prescriptions as of 05/02/2014  Medication Sig  . cetirizine (ZYRTEC) 10 MG tablet Take 1 tablet (10 mg total) by mouth daily. For allergies  . DULoxetine (CYMBALTA) 60 MG capsule Take 1 capsule (60 mg total) by mouth daily. For depression  . hydrOXYzine (ATARAX/VISTARIL) 25 MG tablet Take 1 tablet (25 mg) three times daily as needed for anxiety  . Insulin Glargine (LANTUS SOLOSTAR) 100 UNIT/ML Solostar Pen INJECT 30 UNITS INTO THE SKIN AT BEDTIME FOR GLYCEMIC CONTROL. DX 250.02  . lamoTRIgine (LAMICTAL) 100 MG tablet Take 1 tablet (100 mg total) by mouth daily. For mood stabilization  . meloxicam (MOBIC) 7.5 MG tablet TAKE 1 TABLET (7.5 MG TOTAL) BY MOUTH DAILY.  . metFORMIN (GLUCOPHAGE) 1000 MG tablet TAKE 1 TABLET BY MOUTH TWICE A DAY AS DIRECTED  . omega-3 acid ethyl esters (LOVAZA) 1 G capsule Take 4 capsules (4 g total) by mouth daily. For high cholesterol/fats  . pioglitazone (ACTOS) 15 MG tablet Take 1 tablet (15 mg total) by mouth daily. For diabetes management  . simvastatin (ZOCOR) 40 MG tablet Take 1 tablet (40 mg total) by mouth at bedtime. For high cholesterol  . Testosterone (TESTOPEL) 75 MG PLLT by Implant route.  Marland Kitchen tiZANidine (ZANAFLEX) 4 MG tablet Take 1 tablet (4 mg total) by mouth 2 (two) times daily as needed for muscle spasms.  . traZODone (DESYREL) 100 MG tablet Take 1 tablet (100 mg total) by mouth at bedtime. For sleep  . [DISCONTINUED] cephALEXin (KEFLEX) 500 MG capsule Take 1 capsule (500 mg total) by mouth 4 (four) times daily.  .  [DISCONTINUED] DULoxetine (CYMBALTA) 60 MG capsule Take 1 capsule (60 mg total) by mouth daily. For depression  . [DISCONTINUED] lamoTRIgine (LAMICTAL) 100 MG tablet Take 1 tablet (100 mg total) by mouth daily. For mood stabilization  . [DISCONTINUED] traZODone (DESYREL) 100 MG tablet Take 1 tablet (100 mg total) by mouth at bedtime. For sleep    Recent Results (from the past 2160 hour(s))  CBC WITH DIFFERENTIAL     Status: Abnormal   Collection Time    03/19/14  5:40 AM      Result Value Ref Range   WBC 13.8 (*) 4.0 - 10.5 K/uL   RBC 4.12 (*) 4.22 - 5.81 MIL/uL   Hemoglobin 12.8 (*) 13.0 - 17.0 g/dL   HCT 16.1 (*) 09.6 - 04.5 %   MCV 87.9  78.0 - 100.0 fL   MCH 31.1  26.0 - 34.0 pg   MCHC 35.4  30.0 - 36.0 g/dL   RDW 40.9  81.1 - 91.4 %   Platelets 293  150 - 400 K/uL  Neutrophils Relative % 71  43 - 77 %   Neutro Abs 9.8 (*) 1.7 - 7.7 K/uL   Lymphocytes Relative 21  12 - 46 %   Lymphs Abs 2.9  0.7 - 4.0 K/uL   Monocytes Relative 7  3 - 12 %   Monocytes Absolute 1.0  0.1 - 1.0 K/uL   Eosinophils Relative 0  0 - 5 %   Eosinophils Absolute 0.0  0.0 - 0.7 K/uL   Basophils Relative 0  0 - 1 %   Basophils Absolute 0.0  0.0 - 0.1 K/uL  ETHANOL     Status: Abnormal   Collection Time    03/19/14  5:40 AM      Result Value Ref Range   Alcohol, Ethyl (B) 149 (*) 0 - 11 mg/dL   Comment:            LOWEST DETECTABLE LIMIT FOR     SERUM ALCOHOL IS 11 mg/dL     FOR MEDICAL PURPOSES ONLY  CBG MONITORING, ED     Status: Abnormal   Collection Time    03/19/14  5:47 AM      Result Value Ref Range   Glucose-Capillary 185 (*) 70 - 99 mg/dL  I-STAT CHEM 8, ED     Status: Abnormal   Collection Time    03/19/14  6:01 AM      Result Value Ref Range   Sodium 130 (*) 137 - 147 mEq/L   Potassium 4.0  3.7 - 5.3 mEq/L   Chloride 96  96 - 112 mEq/L   BUN <3 (*) 6 - 23 mg/dL   Creatinine, Ser 1.61  0.50 - 1.35 mg/dL   Glucose, Bld 096 (*) 70 - 99 mg/dL   Calcium, Ion 0.45 (*) 1.12 - 1.23  mmol/L   TCO2 14  0 - 100 mmol/L   Hemoglobin 14.3  13.0 - 17.0 g/dL   HCT 40.9  81.1 - 91.4 %  URINE RAPID DRUG SCREEN (HOSP PERFORMED)     Status: Abnormal   Collection Time    03/19/14  6:26 AM      Result Value Ref Range   Opiates NONE DETECTED  NONE DETECTED   Cocaine POSITIVE (*) NONE DETECTED   Benzodiazepines NONE DETECTED  NONE DETECTED   Amphetamines NONE DETECTED  NONE DETECTED   Tetrahydrocannabinol NONE DETECTED  NONE DETECTED   Barbiturates NONE DETECTED  NONE DETECTED   Comment:            DRUG SCREEN FOR MEDICAL PURPOSES     ONLY.  IF CONFIRMATION IS NEEDED     FOR ANY PURPOSE, NOTIFY LAB     WITHIN 5 DAYS.                LOWEST DETECTABLE LIMITS     FOR URINE DRUG SCREEN     Drug Class       Cutoff (ng/mL)     Amphetamine      1000     Barbiturate      200     Benzodiazepine   200     Tricyclics       300     Opiates          300     Cocaine          300     THC              50  ACETAMINOPHEN LEVEL  Status: None   Collection Time    03/19/14  7:34 AM      Result Value Ref Range   Acetaminophen (Tylenol), Serum <15.0  10 - 30 ug/mL   Comment:            THERAPEUTIC CONCENTRATIONS VARY     SIGNIFICANTLY. A RANGE OF 10-30     ug/mL MAY BE AN EFFECTIVE     CONCENTRATION FOR MANY PATIENTS.     HOWEVER, SOME ARE BEST TREATED     AT CONCENTRATIONS OUTSIDE THIS     RANGE.     ACETAMINOPHEN CONCENTRATIONS     >150 ug/mL AT 4 HOURS AFTER     INGESTION AND >50 ug/mL AT 12     HOURS AFTER INGESTION ARE     OFTEN ASSOCIATED WITH TOXIC     REACTIONS.  SALICYLATE LEVEL     Status: Abnormal   Collection Time    03/19/14  7:34 AM      Result Value Ref Range   Salicylate Lvl <2.0 (*) 2.8 - 20.0 mg/dL  CBG MONITORING, ED     Status: None   Collection Time    03/19/14  5:36 PM      Result Value Ref Range   Glucose-Capillary 97  70 - 99 mg/dL  CBG MONITORING, ED     Status: Abnormal   Collection Time    03/19/14  9:08 PM      Result Value Ref Range    Glucose-Capillary 139 (*) 70 - 99 mg/dL   Comment 1 Notify RN    CBG MONITORING, ED     Status: Abnormal   Collection Time    03/20/14  7:58 AM      Result Value Ref Range   Glucose-Capillary 150 (*) 70 - 99 mg/dL  CBG MONITORING, ED     Status: Abnormal   Collection Time    03/20/14  1:03 PM      Result Value Ref Range   Glucose-Capillary 140 (*) 70 - 99 mg/dL  GLUCOSE, CAPILLARY     Status: Abnormal   Collection Time    03/20/14  5:02 PM      Result Value Ref Range   Glucose-Capillary 199 (*) 70 - 99 mg/dL   Comment 1 Notify RN    GLUCOSE, CAPILLARY     Status: Abnormal   Collection Time    03/20/14  8:44 PM      Result Value Ref Range   Glucose-Capillary 187 (*) 70 - 99 mg/dL  GLUCOSE, CAPILLARY     Status: Abnormal   Collection Time    03/21/14  6:25 AM      Result Value Ref Range   Glucose-Capillary 277 (*) 70 - 99 mg/dL  GLUCOSE, CAPILLARY     Status: Abnormal   Collection Time    03/21/14 12:01 PM      Result Value Ref Range   Glucose-Capillary 320 (*) 70 - 99 mg/dL  GLUCOSE, CAPILLARY     Status: Abnormal   Collection Time    03/21/14  5:04 PM      Result Value Ref Range   Glucose-Capillary 256 (*) 70 - 99 mg/dL  GLUCOSE, CAPILLARY     Status: Abnormal   Collection Time    03/21/14  9:21 PM      Result Value Ref Range   Glucose-Capillary 265 (*) 70 - 99 mg/dL  GLUCOSE, CAPILLARY     Status: Abnormal   Collection Time  03/22/14  6:24 AM      Result Value Ref Range   Glucose-Capillary 174 (*) 70 - 99 mg/dL  GLUCOSE, CAPILLARY     Status: Abnormal   Collection Time    03/22/14 11:53 AM      Result Value Ref Range   Glucose-Capillary 287 (*) 70 - 99 mg/dL  GLUCOSE, CAPILLARY     Status: Abnormal   Collection Time    03/22/14  5:02 PM      Result Value Ref Range   Glucose-Capillary 266 (*) 70 - 99 mg/dL  GLUCOSE, CAPILLARY     Status: Abnormal   Collection Time    03/22/14  8:47 PM      Result Value Ref Range   Glucose-Capillary 199 (*) 70 - 99 mg/dL   GLUCOSE, CAPILLARY     Status: Abnormal   Collection Time    03/23/14  6:11 AM      Result Value Ref Range   Glucose-Capillary 225 (*) 70 - 99 mg/dL      Physical Exam: Constitutional:  BP 128/73  Pulse 107  Ht 6\' 1"  (1.854 m)  Wt 214 lb 12.8 oz (97.433 kg)  BMI 28.35 kg/m2  Musculoskeletal: Strength & Muscle Tone: within normal limits Gait & Station: normal Patient leans: N/A  Mental Status Examination;  Patient is casually dressed and fairly groomed.  He maintains fair eye contact.  His speech is slow but coherent.  He described his mood is good and his affect is improved from the past.  He denies any paranoia , delusions or any excessive thoughts.  He denies any active or passive suicidal thoughts or homicidal thoughts.  His attention and concentration is okay.  His thought process is logical and goal-directed.  His psychomotor activity is normal.  His fund of knowledge is average.  He is alert and oriented x3.  There were no tremors or shakes present.  His insight judgment and impulse control is good.   Established Problem, Stable/Improving (1), Review of Psycho-Social Stressors (1), Review or order clinical lab tests (1), Decision to obtain old records (1), Review and summation of old records (2), Review of Last Therapy Session (1), Review of Medication Regimen & Side Effects (2) and Review of New Medication or Change in Dosage (2)  Assessment: Axis I: Bipolar disorder NOS, rule out posttraumatic stress disorder  Axis II: Deferred  Axis III:  Past Medical History  Diagnosis Date  . Allergy   . Depression   . GERD (gastroesophageal reflux disease)   . Hyperlipidemia   . Chronic pain disorder     Sees [facility]  . Urinary incontinence     detrusor instability  . Hypogonadism   . Diabetes mellitus     Type II  . Chronic neck pain     Axis IV: Mild to moderate   Plan:  I reviewed his collateral information including his lab results .  Patient  denies using alcohol since he is from the hospital.  He is taking his medication as prescribed.  He is feeling much improvement since he has testosterone implant .  He has a rash on back of his neck for which he has seen primary care physician and antibiotic given.  I do not believe this rash is associated with Lamictal .  Patient has taken Lamictal 100 mg in the past without any side effects.  Patient was told it was folliculitis .  Patient will see primary care physician or dermatologist if antibiotic  did not help him.  However I encouraged him to call us back if rash spread her symptoms get worse.  I also discuss use of drugs and alcohol cause worsening of the symptoms, importance of medication compliance and risk of relapse with noncompliance of medication.  Encouraged to keep appointment with Victorino DikeJennifer which he had missed .  Patient is no longer taking narcotic pain medication and benzodiazepines.  Discussed risks and benefits of medication.  Recommended to call us back  If he has any outlook continue Cymbalta 60 mg daily, Lamictal 100 mg daily and trazodone 100 mg at bedtime.  Patient still has remaining Vistaril which he takes only if needed.  I will see him again in 2 months.  Time spent 25 minutes.  More than 50% of the time spent in psychoeducation, counseling and coordination of care.  Discuss safety plan that anytime having active suicidal thoughts or homicidal thoughts then patient need to call 911 or go to the local emergency room.  ARFEEN,SYED T., MD 05/02/2014

## 2014-05-02 NOTE — Telephone Encounter (Signed)
I would recommend steroid cream and referral to dermatology.

## 2014-05-02 NOTE — Telephone Encounter (Signed)
Relevant patient education assigned to patient using Emmi. ° °

## 2014-05-02 NOTE — Telephone Encounter (Signed)
Left detailed message on pt's cell vm. 

## 2014-05-02 NOTE — Telephone Encounter (Signed)
Patients wife left message stating rash on neck is not getting better with antibiotics, he wants to know if a steroid injection would help?

## 2014-05-12 ENCOUNTER — Other Ambulatory Visit: Payer: Self-pay | Admitting: Endocrinology

## 2014-05-16 ENCOUNTER — Telehealth: Payer: Self-pay | Admitting: *Deleted

## 2014-05-16 NOTE — Telephone Encounter (Signed)
Notified pt. He states he will have to call us back tomorrow. His wife just had surgery today.

## 2014-05-16 NOTE — Telephone Encounter (Signed)
Needs OV please.

## 2014-05-16 NOTE — Telephone Encounter (Signed)
Pt left message requesting rx of prednisone dose pack for his neck and shoulder pain.  Please advise.

## 2014-05-22 ENCOUNTER — Other Ambulatory Visit: Payer: Self-pay | Admitting: Family

## 2014-05-26 ENCOUNTER — Encounter: Payer: Self-pay | Admitting: Family

## 2014-05-26 ENCOUNTER — Ambulatory Visit (HOSPITAL_COMMUNITY): Payer: Self-pay | Admitting: Psychiatry

## 2014-05-26 ENCOUNTER — Ambulatory Visit (INDEPENDENT_AMBULATORY_CARE_PROVIDER_SITE_OTHER): Payer: PRIVATE HEALTH INSURANCE | Admitting: Family

## 2014-05-26 VITALS — BP 136/82 | HR 104 | Temp 97.8°F | Resp 18 | Ht 72.0 in | Wt 204.1 lb

## 2014-05-26 DIAGNOSIS — D72829 Elevated white blood cell count, unspecified: Secondary | ICD-10-CM

## 2014-05-26 DIAGNOSIS — R634 Abnormal weight loss: Secondary | ICD-10-CM

## 2014-05-26 MED ORDER — SIMVASTATIN 40 MG PO TABS: 40.0000 mg | ORAL_TABLET | Freq: Every day | ORAL | Status: DC

## 2014-05-26 MED ORDER — PIOGLITAZONE HCL 15 MG PO TABS: 15.0000 mg | ORAL_TABLET | Freq: Every day | ORAL | Status: DC

## 2014-05-26 NOTE — Progress Notes (Signed)
Pre visit review using our clinic review tool, if applicable. No additional management support is needed unless otherwise documented below in the visit note. 

## 2014-05-26 NOTE — Assessment & Plan Note (Signed)
Will have pt repeat CBC in 1 week.  If WBC remains elevated, plan referral to hematology.  He is currently off of PO steroids.

## 2014-05-26 NOTE — Patient Instructions (Signed)
Please complete lab work in 1 week.  Follow up in 6 weeks.

## 2014-05-26 NOTE — Assessment & Plan Note (Signed)
Will obtain TSH.  Advised pt to monitor weight at home and call if he experiences further weight loss.  Stress could certainly be a contributing factor.

## 2014-05-26 NOTE — Progress Notes (Signed)
Subjective:    Patient ID: Timothy MiresJeffrey L Salton Jr., male    DOB: 1976-01-27, 38 y.o.   MRN: [id number]  HPI  Mr. [name] is a 38 yr old male who presents today to discuss leukocytosis. Recent WBC was 20K with Dr. Pete GlatterStoneking. He has also had some recent weight loss.  Denies fever. Reports that he started a pred pack on Sunday for neck and shoulder pain.  Had not taken any steroids prior to his blood draw.  Started pred after blood drawn. Denies dysuria/frequency, abdominal pain.   He does not some soreness in the ribs on the left side. Reports that he has been bruising easily.   Weight loss-  Has lost 10 pounds recently.  Wife was recently diagnosed with colon cancer.   Wt Readings from Last 3 Encounters:  05/26/14 204 lb 1.3 oz (92.57 kg)  05/02/14 214 lb 12.8 oz (97.433 kg)  04/21/14 211 lb 1.3 oz (95.745 kg)     Review of Systems See HPI  Past Medical History  Diagnosis Date  . Allergy   . Depression   . GERD (gastroesophageal reflux disease)   . Hyperlipidemia   . Chronic pain disorder     Sees [facility]  . Urinary incontinence     detrusor instability  . Hypogonadism   . Diabetes mellitus     Type II  . Chronic neck pain     History   Social History  . Marital Status: Married    Spouse Name: N/A    Number of Children: 2  . Years of Education: N/A   Occupational History  . Disabled     chronic pain   Social History Main Topics  . Smoking status: Current Every Day Smoker -- 1.50 packs/day    Types: Cigarettes    Last Attempt to Quit: 08/29/2013  . Smokeless tobacco: Never Used  . Alcohol Use: Yes  . Drug Use: Yes    Special: Cocaine     Comment: stopped cocaine--2003. Normal drug screen March 2008  . Sexual Activity: Yes    Birth Control/ Protection: None   Other Topics Concern  . Not on file   Social History Narrative  . No narrative on file    Past Surgical History  Procedure Laterality Date  . Appendectomy    . Hernia repair      . Nasal sinus surgery  2008    Family History  Problem Relation Age of Onset  . Diabetes Mother   . Hypertension Mother   . Cirrhosis Mother   . Depression Mother   . Bipolar disorder Mother   . Arthritis Father 49    osteoarthritis  . Bipolar disorder Father   . Diabetes Sister     borderline  . Heart disease Paternal Uncle [phone]    MI  . Heart disease Maternal Grandfather     late 60's--MI  . Cancer Paternal Grandmother [phone]    lung  . Bipolar disorder Paternal Grandmother   . Heart disease Paternal Grandfather [phone]    MI  . Bipolar disorder Paternal Grandfather     Allergies  Allergen Reactions  . Ciprofloxacin Hives  . Hydrocodone     Rash and nausea    Current Outpatient Prescriptions on File Prior to Visit  Medication Sig Dispense Refill  . betamethasone dipropionate (DIPROLENE) 0.05 % cream Apply topically 2 (two) times daily as needed.  30 g  0  . cetirizine (ZYRTEC) 10 MG tablet Take 1 tablet (  10 mg total) by mouth daily. For allergies      . DULoxetine (CYMBALTA) 60 MG capsule Take 1 capsule (60 mg total) by mouth daily. For depression  30 capsule  1  . hydrOXYzine (ATARAX/VISTARIL) 25 MG tablet Take 1 tablet (25 mg) three times daily as needed for anxiety  60 tablet  0  . Insulin Glargine (LANTUS SOLOSTAR) 100 UNIT/ML Solostar Pen INJECT 30 UNITS INTO THE SKIN AT BEDTIME FOR GLYCEMIC CONTROL. DX 250.02      . lamoTRIgine (LAMICTAL) 100 MG tablet Take 1 tablet (100 mg total) by mouth daily. For mood stabilization  30 tablet  1  . meloxicam (MOBIC) 7.5 MG tablet TAKE 1 TABLET (7.5 MG TOTAL) BY MOUTH DAILY.  30 tablet  2  . metFORMIN (GLUCOPHAGE) 1000 MG tablet TAKE 1 TABLET BY MOUTH TWICE A DAY AS DIRECTED  60 tablet  3  . omega-3 acid ethyl esters (LOVAZA) 1 G capsule Take 4 capsules (4 g total) by mouth daily. For high cholesterol/fats      . Testosterone (TESTOPEL) 75 MG PLLT by Implant route.      Marland Kitchen tiZANidine (ZANAFLEX) 4 MG tablet TAKE 1 TABLET (4 MG TOTAL) BY  MOUTH 2 (TWO) TIMES DAILY AS NEEDED FOR MUSCLE SPASMS.  60 tablet  0  . traZODone (DESYREL) 100 MG tablet Take 1 tablet (100 mg total) by mouth at bedtime. For sleep  30 tablet  1  . [DISCONTINUED] albuterol (PROVENTIL) 90 MCG/ACT inhaler Inhale 2 puffs into the lungs every 6 (six) hours as needed for wheezing.  17 g  12   No current facility-administered medications on file prior to visit.    BP 136/82  Pulse 104  Temp(Src) 97.8 F (36.6 C) (Oral)  Resp 18  Ht 6' (1.829 m)  Wt 204 lb 1.3 oz (92.57 kg)  BMI 27.67 kg/m2  SpO2 99%       Objective:   Physical Exam  Constitutional: He is oriented to person, place, and time. He appears well-developed and well-nourished. No distress.  Cardiovascular: Normal rate and regular rhythm.   No murmur heard. Pulmonary/Chest: Effort normal and breath sounds normal. No respiratory distress. He has no wheezes. He has no rales. He exhibits no tenderness.  Musculoskeletal: He exhibits no edema.  Neurological: He is alert and oriented to person, place, and time.  Psychiatric: He has a normal mood and affect. His behavior is normal. Judgment and thought content normal.          Assessment & Plan:

## 2014-05-29 ENCOUNTER — Ambulatory Visit: Payer: Self-pay | Admitting: Family

## 2014-05-30 ENCOUNTER — Telehealth: Payer: Self-pay | Admitting: *Deleted

## 2014-05-30 MED ORDER — SIMVASTATIN 40 MG PO TABS: 40.0000 mg | ORAL_TABLET | Freq: Every day | ORAL | Status: DC

## 2014-05-30 MED ORDER — PIOGLITAZONE HCL 15 MG PO TABS: 15.0000 mg | ORAL_TABLET | Freq: Every day | ORAL | Status: DC

## 2014-05-30 NOTE — Telephone Encounter (Signed)
[name] wife sent mychart message requesting refills on behalf of pt for simvastatin and Actos. Refills sent.

## 2014-05-31 ENCOUNTER — Telehealth: Payer: Self-pay | Admitting: Family

## 2014-05-31 NOTE — Telephone Encounter (Signed)
Pls remind pt to complete 7/17 labs

## 2014-06-01 NOTE — Telephone Encounter (Signed)
Pt was contacted and reminded of labs.Pt stated he would have them completed by the weekend.

## 2014-06-03 ENCOUNTER — Other Ambulatory Visit (HOSPITAL_COMMUNITY): Payer: Self-pay | Admitting: Psychiatry

## 2014-06-03 LAB — CBC WITH DIFFERENTIAL/PLATELET
BASOS ABS: 0.1 10*3/uL (ref 0.0–0.1)
BASOS PCT: 1 % (ref 0–1)
Eosinophils Absolute: 0.3 10*3/uL (ref 0.0–0.7)
Eosinophils Relative: 3 % (ref 0–5)
HCT: 46.5 % (ref 39.0–52.0)
Hemoglobin: 15.8 g/dL (ref 13.0–17.0)
Lymphocytes Relative: 22 % (ref 12–46)
Lymphs Abs: 1.9 10*3/uL (ref 0.7–4.0)
MCH: 30.3 pg (ref 26.0–34.0)
MCHC: 34 g/dL (ref 30.0–36.0)
MCV: 89.1 fL (ref 78.0–100.0)
Monocytes Absolute: 0.9 10*3/uL (ref 0.1–1.0)
Monocytes Relative: 10 % (ref 3–12)
NEUTROS PCT: 64 % (ref 43–77)
Neutro Abs: 5.6 10*3/uL (ref 1.7–7.7)
Platelets: 300 10*3/uL (ref 150–400)
RBC: 5.22 MIL/uL (ref 4.22–5.81)
RDW: 13.5 % (ref 11.5–15.5)
WBC: 8.8 10*3/uL (ref 4.0–10.5)

## 2014-06-03 LAB — TSH: TSH: 1.844 u[IU]/mL (ref 0.350–4.500)

## 2014-06-05 ENCOUNTER — Encounter: Payer: Self-pay | Admitting: Family

## 2014-06-06 ENCOUNTER — Encounter: Payer: Self-pay | Admitting: Family

## 2014-06-19 ENCOUNTER — Ambulatory Visit: Payer: Self-pay | Admitting: Family

## 2014-06-21 ENCOUNTER — Other Ambulatory Visit (HOSPITAL_COMMUNITY): Payer: Self-pay | Admitting: Psychiatry

## 2014-06-23 ENCOUNTER — Other Ambulatory Visit: Payer: Self-pay | Admitting: Family

## 2014-06-30 ENCOUNTER — Ambulatory Visit (HOSPITAL_COMMUNITY): Payer: Self-pay | Admitting: Psychiatry

## 2014-07-03 ENCOUNTER — Encounter: Payer: Self-pay | Admitting: Family

## 2014-07-03 ENCOUNTER — Ambulatory Visit (INDEPENDENT_AMBULATORY_CARE_PROVIDER_SITE_OTHER): Payer: PRIVATE HEALTH INSURANCE | Admitting: Family

## 2014-07-03 VITALS — BP 124/84 | HR 66 | Temp 97.8°F | Resp 16 | Ht 72.0 in | Wt 202.1 lb

## 2014-07-03 DIAGNOSIS — M542 Cervicalgia: Secondary | ICD-10-CM

## 2014-07-03 DIAGNOSIS — R634 Abnormal weight loss: Secondary | ICD-10-CM

## 2014-07-03 NOTE — Progress Notes (Signed)
Subjective:    Patient ID: [name], male    DOB: March 03, 1976, 38 y.o.   MRN: [id number]  HPI  Mr. Werber is a 38 yr old male who presents today for follow up.  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 Reports that his fasting sugars are 120-140.   Wt Readings from Last 3 Encounters:  07/03/14 202 lb 1.3 oz (91.663 kg)  05/26/14 204 lb 1.3 oz (92.57 kg)  05/02/14 214 lb 12.8 oz (97.433 kg)   Body mass index is 27.4 kg/(m^2).  2) Neck Pain- reports that in the evenings after work he has worsening neck pain.  Using tylenol and motrin without improvement. He continues to use zanaflex prn.  He had injection with Dr. Ethelene Hal- only lasted 2 days.    Review of Systems See HPI  Past Medical History  Diagnosis Date  . Allergy   . Depression   . GERD (gastroesophageal reflux disease)   . Hyperlipidemia   . Chronic pain disorder     Sees [facility]  . Urinary incontinence     detrusor instability  . Hypogonadism   . Diabetes mellitus     Type II  . Chronic neck pain     History   Social History  . Marital Status: Married    Spouse Name: N/A    Number of Children: 2  . Years of Education: N/A   Occupational History  . Disabled     chronic pain   Social History Main Topics  . Smoking status: Current Every Day Smoker -- 1.50 packs/day    Types: Cigarettes    Last Attempt to Quit: 08/29/2013  . Smokeless tobacco: Never Used  . Alcohol Use: Yes  . Drug Use: Yes    Special: Cocaine     Comment: stopped cocaine--2003. Normal drug screen March 2008  . Sexual Activity: Yes    Birth Control/ Protection: None   Other Topics Concern  . Not on file   Social History Narrative  . No narrative on file    Past Surgical History  Procedure Laterality Date  . Appendectomy    . Hernia repair    . Nasal sinus surgery  2008    Family History  Problem Relation Age of Onset  . Diabetes Mother   . Hypertension Mother   . Cirrhosis  Mother   . Depression Mother   . Bipolar disorder Mother   . Arthritis Father 49    osteoarthritis  . Bipolar disorder Father   . Diabetes Sister     borderline  . Heart disease Paternal Uncle 4    MI  . Heart disease Maternal Grandfather     late 60's--MI  . Cancer Paternal Grandmother 81    lung  . Bipolar disorder Paternal Grandmother   . Heart disease Paternal Grandfather 6    MI  . Bipolar disorder Paternal Grandfather     Allergies  Allergen Reactions  . Ciprofloxacin Hives  . Hydrocodone     Rash and nausea    Current Outpatient Prescriptions on File Prior to Visit  Medication Sig Dispense Refill  . betamethasone dipropionate (DIPROLENE) 0.05 % cream Apply topically 2 (two) times daily as needed.  30 g  0  . cetirizine (ZYRTEC) 10 MG tablet Take 1 tablet (10 mg total) by mouth daily. For allergies      . DULoxetine (CYMBALTA) 60 MG capsule Take 1 capsule (60 mg total) by mouth  daily. For depression  30 capsule  1  . hydrOXYzine (ATARAX/VISTARIL) 50 MG tablet TAKE 1 TABLET BY MOUTH EVERY DAY AS NEEDED FOR ANXIETY  30 tablet  0  . Insulin Glargine (LANTUS SOLOSTAR) 100 UNIT/ML Solostar Pen INJECT 30 UNITS INTO THE SKIN AT BEDTIME FOR GLYCEMIC CONTROL. DX 250.02      . lamoTRIgine (LAMICTAL) 100 MG tablet Take 1 tablet (100 mg total) by mouth daily. For mood stabilization  30 tablet  1  . meloxicam (MOBIC) 7.5 MG tablet TAKE 1 TABLET (7.5 MG TOTAL) BY MOUTH DAILY.  30 tablet  2  . metFORMIN (GLUCOPHAGE) 1000 MG tablet TAKE 1 TABLET BY MOUTH TWICE A DAY AS DIRECTED  60 tablet  3  . omega-3 acid ethyl esters (LOVAZA) 1 G capsule Take 4 capsules (4 g total) by mouth daily. For high cholesterol/fats      . pioglitazone (ACTOS) 15 MG tablet Take 1 tablet (15 mg total) by mouth daily. For diabetes management  30 tablet  2  . Testosterone (TESTOPEL) 75 MG PLLT by Implant route.      Marland Kitchen tiZANidine (ZANAFLEX) 4 MG tablet TAKE 1 TABLET (4 MG TOTAL) BY MOUTH 2 (TWO) TIMES DAILY AS  NEEDED FOR MUSCLE SPASMS.  60 tablet  0  . traZODone (DESYREL) 100 MG tablet Take 1 tablet (100 mg total) by mouth at bedtime. For sleep  30 tablet  1  . [DISCONTINUED] albuterol (PROVENTIL) 90 MCG/ACT inhaler Inhale 2 puffs into the lungs every 6 (six) hours as needed for wheezing.  17 g  12   No current facility-administered medications on file prior to visit.    BP 124/84  Pulse 66  Temp(Src) 97.8 F (36.6 C) (Oral)  Resp 16  Ht 6' (1.829 m)  Wt 202 lb 1.3 oz (91.663 kg)  BMI 27.40 kg/m2  SpO2 99%       Objective:   Physical Exam  Constitutional: He is oriented to person, place, and time. He appears well-developed and well-nourished. No distress.  Pt smells faintly of alcohol  Cardiovascular: Normal rate and regular rhythm.   No murmur heard. Pulmonary/Chest: Effort normal and breath sounds normal. No respiratory distress. He has no wheezes. He has no rales. He exhibits no tenderness.  Musculoskeletal:       Cervical back: He exhibits no tenderness.  No cervical neck tenderness  Neurological: He is alert and oriented to person, place, and time.          Assessment & Plan:

## 2014-07-03 NOTE — Progress Notes (Signed)
Pre visit review using our clinic review tool, if applicable. No additional management support is needed unless otherwise documented below in the visit note.,h  

## 2014-07-03 NOTE — Patient Instructions (Signed)
Please arrange follow up with [facility]. You may continue tylenol, motrin, as needed zanaflex for neck pain.  Call if you continue to have more than 5 more pounds of weight loss. Follow up in 3 months.

## 2014-07-05 ENCOUNTER — Telehealth: Payer: Self-pay | Admitting: *Deleted

## 2014-07-05 MED ORDER — SIMVASTATIN 40 MG PO TABS: 40.0000 mg | ORAL_TABLET | Freq: Every day | ORAL | Status: DC

## 2014-07-05 NOTE — Telephone Encounter (Signed)
[name] wife left message requesting refill of simvastatin to pt's new pharmacy [facility] in [place]).  Refill sent. Also requests steroid Rx for his neck pain until he can get in with pain management.  Please advise.

## 2014-07-06 MED ORDER — METHYLPREDNISOLONE 4 MG PO KIT: PACK | ORAL | Status: DC

## 2014-07-06 NOTE — Assessment & Plan Note (Signed)
I think that his weight loss is likely related to recent increase in activity with his new job. I have asked pt to continue to monitor his weight at home.

## 2014-07-06 NOTE — Telephone Encounter (Signed)
rx sent for medrol dose pak.  Advise pt to monitor sugars closely on medrol dose pak.  Call if sugar >300.

## 2014-07-06 NOTE — Assessment & Plan Note (Signed)
Deteriorated. Medrol dose pak, (see phone note) and refer to pain management.

## 2014-07-07 NOTE — Telephone Encounter (Signed)
Left detailed message on home voicemail and to call if any questions. 

## 2014-07-09 ENCOUNTER — Other Ambulatory Visit (HOSPITAL_COMMUNITY): Payer: Self-pay | Admitting: Psychiatry

## 2014-07-09 DIAGNOSIS — F319 Bipolar disorder, unspecified: Secondary | ICD-10-CM

## 2014-07-12 ENCOUNTER — Other Ambulatory Visit (HOSPITAL_COMMUNITY): Payer: Self-pay | Admitting: *Deleted

## 2014-07-12 ENCOUNTER — Other Ambulatory Visit: Payer: Self-pay | Admitting: *Deleted

## 2014-07-12 DIAGNOSIS — F319 Bipolar disorder, unspecified: Secondary | ICD-10-CM

## 2014-07-12 MED ORDER — SIMVASTATIN 40 MG PO TABS: 40.0000 mg | ORAL_TABLET | Freq: Every day | ORAL | Status: DC

## 2014-07-12 MED ORDER — TRAZODONE HCL 100 MG PO TABS: 100.0000 mg | ORAL_TABLET | Freq: Every day | ORAL | Status: DC

## 2014-07-12 MED ORDER — DULOXETINE HCL 60 MG PO CPEP: 60.0000 mg | ORAL_CAPSULE | Freq: Every day | ORAL | Status: DC

## 2014-07-18 ENCOUNTER — Other Ambulatory Visit (HOSPITAL_COMMUNITY): Payer: Self-pay | Admitting: *Deleted

## 2014-07-18 NOTE — Telephone Encounter (Signed)
Refill sent to pharmacy on 9/2

## 2014-07-19 ENCOUNTER — Telehealth: Payer: Self-pay | Admitting: Family

## 2014-07-19 NOTE — Telephone Encounter (Signed)
Attempted to call pt's spouse and received message that mailbox is full.

## 2014-07-19 NOTE — Telephone Encounter (Signed)
Attempted to reach pt's spouse and received message that mailbox is full. Will try again later.

## 2014-07-19 NOTE — Telephone Encounter (Signed)
Caller name: [name]  Relation to pt: spouse  Call back number: [phone] Pharmacy:[facility] - [place], [place] - [address]   [phone]  Reason for call: pt requesting a nasal spray rx, pt would like Nicki Guadalajara to give her call

## 2014-07-20 MED ORDER — AZELASTINE HCL 0.1 % NA SOLN: 2.0000 | Freq: Two times a day (BID) | NASAL | Status: DC

## 2014-07-20 NOTE — Telephone Encounter (Signed)
Spoke with pt, he reports sneezing, stuffy nose and sinus drainage. Thinks his allergies are flaring up and would like refill of Azelastine. Has tried flonase otc but feels that azelastine works better.  Please advise.

## 2014-07-20 NOTE — Telephone Encounter (Signed)
rx sent.  It is also available otc.

## 2014-07-20 NOTE — Telephone Encounter (Signed)
Notified pt. 

## 2014-07-24 ENCOUNTER — Telehealth: Payer: Self-pay | Admitting: Family

## 2014-07-24 MED ORDER — AZELASTINE HCL 0.1 % NA SOLN: 2.0000 | Freq: Two times a day (BID) | NASAL | Status: DC

## 2014-07-24 NOTE — Telephone Encounter (Signed)
[name] spouse states that the pharmacy informed her  Today that they never received the rx for the nasal spray,

## 2014-07-24 NOTE — Addendum Note (Signed)
Addended by: Mervin Kung A on: 07/24/2014 04:27 PM   Modules accepted: Orders

## 2014-07-24 NOTE — Telephone Encounter (Signed)
Error/gd °

## 2014-07-24 NOTE — Telephone Encounter (Signed)
Rx re-sent and notified pt.

## 2014-07-25 ENCOUNTER — Other Ambulatory Visit (HOSPITAL_COMMUNITY): Payer: Self-pay | Admitting: Psychiatry

## 2014-08-02 ENCOUNTER — Encounter (HOSPITAL_COMMUNITY): Payer: Self-pay | Admitting: Emergency Medicine

## 2014-08-02 ENCOUNTER — Emergency Department (HOSPITAL_COMMUNITY): Payer: PRIVATE HEALTH INSURANCE

## 2014-08-02 ENCOUNTER — Emergency Department (HOSPITAL_COMMUNITY)
Admission: EM | Admit: 2014-08-02 | Discharge: 2014-08-02 | Disposition: A | Discharge status: Home / Self Care | Payer: PRIVATE HEALTH INSURANCE | Attending: Emergency Medicine | Admitting: Emergency Medicine

## 2014-08-02 DIAGNOSIS — M65839 Other synovitis and tenosynovitis, unspecified forearm: Secondary | ICD-10-CM | POA: Diagnosis not present

## 2014-08-02 DIAGNOSIS — Z79899 Other long term (current) drug therapy: Secondary | ICD-10-CM | POA: Diagnosis not present

## 2014-08-02 DIAGNOSIS — Z8719 Personal history of other diseases of the digestive system: Secondary | ICD-10-CM | POA: Insufficient documentation

## 2014-08-02 DIAGNOSIS — E119 Type 2 diabetes mellitus without complications: Secondary | ICD-10-CM | POA: Diagnosis not present

## 2014-08-02 DIAGNOSIS — M779 Enthesopathy, unspecified: Secondary | ICD-10-CM

## 2014-08-02 DIAGNOSIS — F329 Major depressive disorder, single episode, unspecified: Secondary | ICD-10-CM | POA: Diagnosis not present

## 2014-08-02 DIAGNOSIS — F17200 Nicotine dependence, unspecified, uncomplicated: Secondary | ICD-10-CM | POA: Diagnosis not present

## 2014-08-02 DIAGNOSIS — M25539 Pain in unspecified wrist: Secondary | ICD-10-CM | POA: Insufficient documentation

## 2014-08-02 DIAGNOSIS — G8929 Other chronic pain: Secondary | ICD-10-CM | POA: Insufficient documentation

## 2014-08-02 DIAGNOSIS — M65849 Other synovitis and tenosynovitis, unspecified hand: Secondary | ICD-10-CM | POA: Diagnosis not present

## 2014-08-02 DIAGNOSIS — E785 Hyperlipidemia, unspecified: Secondary | ICD-10-CM | POA: Insufficient documentation

## 2014-08-02 DIAGNOSIS — F3289 Other specified depressive episodes: Secondary | ICD-10-CM | POA: Diagnosis not present

## 2014-08-02 DIAGNOSIS — Z794 Long term (current) use of insulin: Secondary | ICD-10-CM | POA: Insufficient documentation

## 2014-08-02 MED ORDER — ACETAMINOPHEN 325 MG PO TABS
650.0000 mg | ORAL_TABLET | Freq: Once | ORAL | Status: AC
  Administered 2014-08-02: 650 mg via ORAL
  Filled 2014-08-02: qty 2

## 2014-08-02 MED ORDER — KETOROLAC TROMETHAMINE 10 MG PO TABS
10.0000 mg | ORAL_TABLET | Freq: Once | ORAL | Status: AC
  Administered 2014-08-02: 10 mg via ORAL
  Filled 2014-08-02: qty 1

## 2014-08-02 MED ORDER — MELOXICAM 15 MG PO TABS: 15.0000 mg | ORAL_TABLET | Freq: Every day | ORAL | Status: DC

## 2014-08-02 MED ORDER — PREDNISONE 10 MG PO TABS: 60.0000 mg | ORAL_TABLET | Freq: Once | ORAL | Status: DC

## 2014-08-02 NOTE — Discharge Instructions (Signed)
To use the splint for the next 10 days. Please use Celebrex 2 times daily with food. Please see Dr. Hilda Lias for additional evaluation and management if not improving. Tendinitis Tendinitis is swelling and inflammation of the tendons. Tendons are band-like tissues that connect muscle to bone. Tendinitis commonly occurs in the:   Shoulders (rotator cuff).  Heels (Achilles tendon).  Elbows (triceps tendon). CAUSES Tendinitis is usually caused by overusing the tendon, muscles, and joints involved. When the tissue surrounding a tendon (synovium) becomes inflamed, it is called tenosynovitis. Tendinitis commonly develops in people whose jobs require repetitive motions. SYMPTOMS  Pain.  Tenderness.  Mild swelling. DIAGNOSIS Tendinitis is usually diagnosed by physical exam. Your health care provider may also order X-rays or other imaging tests. TREATMENT Your health care provider may recommend certain medicines or exercises for your treatment. HOME CARE INSTRUCTIONS   Use a sling or splint for as long as directed by your health care provider until the pain decreases.  Put ice on the injured area.  Put ice in a plastic bag.  Place a towel between your skin and the bag.  Leave the ice on for 15-20 minutes, 3-4 times a day, or as directed by your health care provider.  Avoid using the limb while the tendon is painful. Perform gentle range of motion exercises only as directed by your health care provider. Stop exercises if pain or discomfort increase, unless directed otherwise by your health care provider.  Only take over-the-counter or prescription medicines for pain, discomfort, or fever as directed by your health care provider. SEEK MEDICAL CARE IF:   Your pain and swelling increase.  You develop new, unexplained symptoms, especially increased numbness in the hands. MAKE SURE YOU:   Understand these instructions.  Will watch your condition.  Will get help right away if you are  not doing well or get worse. Document Released: 10/24/2000 Document Revised: 03/13/2014 Document Reviewed: 01/13/2011 [facility] Patient Information 2015 [place], [place]. This information is not intended to replace advice given to you by your health care provider. Make sure you discuss any questions you have with your health care provider.

## 2014-08-02 NOTE — ED Notes (Signed)
Left wrist pain with swelling. Denies injury. Reports he was playing with his kids last night and wrist was swollen and painful when he woke this AM.

## 2014-08-02 NOTE — ED Provider Notes (Signed)
CSN: [id number]     Arrival date & time 08/02/14  1734 History   First MD Initiated Contact with Patient 08/02/14 1925     Chief Complaint  Patient presents with  . Wrist Pain     (Consider location/radiation/quality/duration/timing/severity/associated sxs/prior Treatment) Patient is a 38 y.o. male presenting with wrist pain. The history is provided by the patient.  Wrist Pain This is a new problem. The current episode started yesterday. The problem has been gradually worsening. Associated symptoms include arthralgias. Pertinent negatives include no abdominal pain, chest pain, coughing, fever, nausea, neck pain, numbness or weakness. Exacerbated by: movement and palpation. He has tried nothing for the symptoms.    Past Medical History  Diagnosis Date  . Allergy   . Depression   . GERD (gastroesophageal reflux disease)   . Hyperlipidemia   . Chronic pain disorder     Sees [facility]  . Urinary incontinence     detrusor instability  . Hypogonadism   . Diabetes mellitus     Type II  . Chronic neck pain    Past Surgical History  Procedure Laterality Date  . Appendectomy    . Hernia repair    . Nasal sinus surgery  2008   Family History  Problem Relation Age of Onset  . Diabetes Mother   . Hypertension Mother   . Cirrhosis Mother   . Depression Mother   . Bipolar disorder Mother   . Arthritis Father 49    osteoarthritis  . Bipolar disorder Father   . Diabetes Sister     borderline  . Heart disease Paternal Uncle 29    MI  . Heart disease Maternal Grandfather     late 60's--MI  . Cancer Paternal Grandmother 15    lung  . Bipolar disorder Paternal Grandmother   . Heart disease Paternal Grandfather 74    MI  . Bipolar disorder Paternal Grandfather    History  Substance Use Topics  . Smoking status: Current Every Day Smoker -- 1.50 packs/day    Types: Cigarettes    Last Attempt to Quit: 08/29/2013  . Smokeless tobacco: Never Used  . Alcohol Use:  Yes    Review of Systems  Constitutional: Negative for fever and activity change.       All ROS Neg except as noted in HPI  HENT: Negative for nosebleeds.   Eyes: Negative for photophobia and discharge.  Respiratory: Negative for cough, shortness of breath and wheezing.   Cardiovascular: Negative for chest pain and palpitations.  Gastrointestinal: Negative for nausea, abdominal pain and blood in stool.  Genitourinary: Negative for dysuria, frequency and hematuria.  Musculoskeletal: Positive for arthralgias. Negative for back pain and neck pain.  Skin: Negative.   Neurological: Negative for dizziness, seizures, speech difficulty, weakness and numbness.  Psychiatric/Behavioral: Negative for hallucinations and confusion.      Allergies  Ciprofloxacin and Hydrocodone  Home Medications   Prior to Admission medications   Medication Sig Start Date End Date Taking? Authorizing Provider  augmented betamethasone dipropionate (DIPROLENE-AF) 0.05 % cream Apply 1 application topically 2 (two) times daily as needed. rash 05/02/14  Yes Historical Provider, MD  azelastine (ASTELIN) 0.1 % nasal spray Place 2 sprays into both nostrils 2 (two) times daily. Use in each nostril as directed 07/24/14  Yes Sandford Craze, NP  cetirizine (ZYRTEC) 10 MG tablet Take 1 tablet (10 mg total) by mouth daily. For allergies 03/23/14  Yes Sanjuana Kava, NP  DULoxetine (CYMBALTA) 60 MG capsule  Take 1 capsule (60 mg total) by mouth daily. For depression 07/12/14  Yes Cleotis Nipper, MD  Insulin Glargine (LANTUS SOLOSTAR) 100 UNIT/ML Solostar Pen INJECT 30 UNITS INTO THE SKIN AT BEDTIME FOR GLYCEMIC CONTROL. DX 250.02 04/14/14  Yes Sandford Craze, NP  lamoTRIgine (LAMICTAL) 100 MG tablet TAKE 1 TABLET BY MOUTH ONCE A DAY. 07/25/14  Yes Cleotis Nipper, MD  metFORMIN (GLUCOPHAGE) 1000 MG tablet TAKE 1 TABLET BY MOUTH TWICE A DAY AS DIRECTED 04/14/14  Yes Sandford Craze, NP  omega-3 acid ethyl esters (LOVAZA) 1 G capsule  Take 4 capsules (4 g total) by mouth daily. For high cholesterol/fats 03/23/14  Yes Sanjuana Kava, NP  oxyCODONE-acetaminophen (PERCOCET) 7.5-325 MG per tablet Take 1 tablet by mouth every 6 (six) hours as needed. pain 07/31/14  Yes Historical Provider, MD  pioglitazone (ACTOS) 15 MG tablet Take 1 tablet (15 mg total) by mouth daily. For diabetes management 05/30/14  Yes Sandford Craze, NP  simvastatin (ZOCOR) 40 MG tablet Take 1 tablet (40 mg total) by mouth at bedtime. For high cholesterol 07/12/14  Yes Reather Littler, MD  traZODone (DESYREL) 100 MG tablet Take 1 tablet (100 mg total) by mouth at bedtime. For sleep 07/12/14  Yes Cleotis Nipper, MD  hydrOXYzine (ATARAX/VISTARIL) 50 MG tablet TAKE 1 TABLET BY MOUTH EVERY DAY AS NEEDED FOR ANXIETY    Cleotis Nipper, MD  Testosterone (TESTOPEL) 75 MG PLLT by Implant route.    Historical Provider, MD   BP 134/80  Pulse 92  Temp(Src) 97.8 F (36.6 C) (Oral)  Resp 18  Ht  (1.854 m)  Wt 190 lb (86.183 kg)  BMI 25.07 kg/m2  SpO2 99% Physical Exam  Nursing note and vitals reviewed. Constitutional: He is oriented to person, place, and time. He appears well-developed and well-nourished.  Non-toxic appearance.  HENT:  Head: Normocephalic.  Right Ear: Tympanic membrane and external ear normal.  Left Ear: Tympanic membrane and external ear normal.  Eyes: EOM and lids are normal. Pupils are equal, round, and reactive to light.  Neck: Normal range of motion. Neck supple. Carotid bruit is not present.  Cardiovascular: Normal rate, regular rhythm, normal heart sounds, intact distal pulses and normal pulses.   Pulmonary/Chest: Breath sounds normal. No respiratory distress.  Abdominal: Soft. Bowel sounds are normal. There is no tenderness. There is no guarding.  Musculoskeletal:       Left wrist: He exhibits decreased range of motion and tenderness. He exhibits no deformity.  Lymphadenopathy:       Head (right side): No submandibular adenopathy present.        Head (left side): No submandibular adenopathy present.    He has no cervical adenopathy.  Neurological: He is alert and oriented to person, place, and time. He has normal strength. No cranial nerve deficit or sensory deficit.  Skin: Skin is warm and dry.  Psychiatric: He has a normal mood and affect. His speech is normal.    ED Course  Procedures (including critical care time) Labs Review Labs Reviewed - No data to display  Imaging Review No results found.   EKG Interpretation None      MDM  X-ray of the left wrist is negative for fracture or dislocation. Examination is consistent with tendinitis. Patient will be placed in a wrist splint. He will be treated with mobic and continue his pain medication.   Final diagnoses:  Tendonitis    *I have reviewed nursing notes, vital signs, and all  appropriate lab and imaging results for this patient.Kathie Dike, PA-C 08/04/14 2034

## 2014-08-05 NOTE — ED Provider Notes (Signed)
Medical screening examination/treatment/procedure(s) were performed by non-physician practitioner and as supervising physician I was immediately available for consultation/collaboration.     Geoffery Lyons, MD 08/05/14 0900

## 2014-08-07 ENCOUNTER — Telehealth (HOSPITAL_COMMUNITY): Payer: Self-pay

## 2014-08-07 ENCOUNTER — Telehealth (HOSPITAL_COMMUNITY): Payer: Self-pay | Admitting: *Deleted

## 2014-08-07 NOTE — Telephone Encounter (Signed)
Patient left [id number] refill of Hydroxyzine 50 mg be sent to [facility]

## 2014-08-08 ENCOUNTER — Other Ambulatory Visit: Payer: Self-pay

## 2014-08-08 ENCOUNTER — Other Ambulatory Visit (HOSPITAL_COMMUNITY): Payer: Self-pay | Admitting: Psychiatry

## 2014-08-08 DIAGNOSIS — F319 Bipolar disorder, unspecified: Secondary | ICD-10-CM

## 2014-08-08 MED ORDER — HYDROXYZINE HCL 50 MG PO TABS: ORAL_TABLET | ORAL | Status: DC

## 2014-08-09 ENCOUNTER — Other Ambulatory Visit (HOSPITAL_COMMUNITY): Payer: Self-pay | Admitting: Psychiatry

## 2014-08-10 ENCOUNTER — Ambulatory Visit: Payer: Self-pay | Admitting: Endocrinology

## 2014-08-10 NOTE — Telephone Encounter (Signed)
He is very upset because he find out that his wife has a cancer and recently she is cheating on him.  He is very upset on her.  He does not want her depression to get worse.  He admitted irritable and wondering if Lamictal can be increased.  Patient has appointment to see this writer next week.  He is taking 100 mg Lamictal.  I would increase 150 mg however recommended if he started to have any suicidal thoughts or homicidal thought that he needed to call 911 or go to the emergency room.  Discussed medication side effects.  Encouraged to keep appointment with this writer on next week.

## 2014-08-11 ENCOUNTER — Ambulatory Visit (INDEPENDENT_AMBULATORY_CARE_PROVIDER_SITE_OTHER): Payer: PRIVATE HEALTH INSURANCE | Admitting: Licensed Clinical Social Worker

## 2014-08-11 ENCOUNTER — Ambulatory Visit: Payer: PRIVATE HEALTH INSURANCE | Admitting: Family

## 2014-08-11 ENCOUNTER — Ambulatory Visit (INDEPENDENT_AMBULATORY_CARE_PROVIDER_SITE_OTHER): Payer: PRIVATE HEALTH INSURANCE | Admitting: Family

## 2014-08-11 ENCOUNTER — Other Ambulatory Visit (INDEPENDENT_AMBULATORY_CARE_PROVIDER_SITE_OTHER): Payer: PRIVATE HEALTH INSURANCE

## 2014-08-11 ENCOUNTER — Encounter: Payer: Self-pay | Admitting: Family

## 2014-08-11 VITALS — BP 133/81 | HR 88 | Temp 98.3°F | Resp 16 | Ht 72.0 in | Wt 188.8 lb

## 2014-08-11 DIAGNOSIS — R634 Abnormal weight loss: Secondary | ICD-10-CM

## 2014-08-11 DIAGNOSIS — Z23 Encounter for immunization: Secondary | ICD-10-CM

## 2014-08-11 DIAGNOSIS — F4322 Adjustment disorder with anxiety: Secondary | ICD-10-CM

## 2014-08-11 DIAGNOSIS — R112 Nausea with vomiting, unspecified: Secondary | ICD-10-CM

## 2014-08-11 LAB — URINALYSIS
BILIRUBIN URINE: NEGATIVE
HGB URINE DIPSTICK: NEGATIVE
Ketones, ur: 40 — AB
LEUKOCYTES UA: NEGATIVE
Nitrite: NEGATIVE
PH: 6.5 (ref 5.0–8.0)
Specific Gravity, Urine: <=1.005 — AB (ref 1.000–1.030)
Total Protein, Urine: NEGATIVE
Urine Glucose: NEGATIVE
Urobilinogen, UA: 0.2 (ref 0.0–1.0)

## 2014-08-11 LAB — HEPATIC FUNCTION PANEL
ALBUMIN: 4.5 g/dL (ref 3.5–5.2)
ALK PHOS: 78 U/L (ref 39–117)
ALT: 17 U/L (ref 0–53)
AST: 16 U/L (ref 0–37)
Bilirubin, Direct: 0.1 mg/dL (ref 0.0–0.3)
TOTAL PROTEIN: 7.8 g/dL (ref 6.0–8.3)
Total Bilirubin: 0.7 mg/dL (ref 0.2–1.2)

## 2014-08-11 LAB — BASIC METABOLIC PANEL
BUN: 6 mg/dL (ref 6–23)
CO2: 26 mEq/L (ref 19–32)
Calcium: 9.5 mg/dL (ref 8.4–10.5)
Chloride: 92 mEq/L — ABNORMAL LOW (ref 96–112)
Creatinine, Ser: 0.8 mg/dL (ref 0.4–1.5)
GFR: 120.88 mL/min (ref >60.00)
GLUCOSE: 174 mg/dL — AB (ref 70–99)
POTASSIUM: 4.3 meq/L (ref 3.5–5.1)
Sodium: 126 mEq/L — ABNORMAL LOW (ref 135–145)

## 2014-08-11 MED ORDER — ONDANSETRON HCL 4 MG PO TABS: 4.0000 mg | ORAL_TABLET | Freq: Three times a day (TID) | ORAL | Status: DC | PRN

## 2014-08-11 NOTE — Progress Notes (Signed)
Subjective:    Patient ID: [name], male    DOB: [birth year], 38 y.o.   MRN: [id number]  HPI  Mr. [name] is a 38 y/o male who presents today for an acute visit.   Reports losing weights the last few months. Indicates the nausea has been going on for about 6 months. It remains constant despite meals. Has not done any treatments for it.  Started getting up in the morning and would vomit 1-2x/week which has progressed to vomting a couple of times per day. Last few weeks appetite has been decreased. Denies any changes to medication. Denies fevers, chills, positive nightsweats.   Been waking up and having to use an inhaler. Legs are cramping at night and on the bottom of foot. Trying to eat, a really good supper but some food in the morning. Denies change in bowel habits. Describes urine as a rusty color. Denies headaches, nasal stuffiness, or cold like symptoms.   Wt Readings from Last 3 Encounters:  08/11/14 188 lb 12.8 oz (85.639 kg)  08/02/14 190 lb (86.183 kg)  07/03/14 202 lb 1.3 oz (91.663 kg)   Diet yesterday: Chicken biscuit in the morning, no lunch, chilli beans - bowl full. States he usually eats more but has been limited.   Currently smokes pack and half a day.   Past Medical History  Diagnosis Date  . Allergy   . Depression   . GERD (gastroesophageal reflux disease)   . Hyperlipidemia   . Chronic pain disorder     Sees [facility]  . Urinary incontinence     detrusor instability  . Hypogonadism   . Diabetes mellitus     Type II  . Chronic neck pain     History   Social History  . Marital Status: Married    Spouse Name: N/A    Number of Children: 2  . Years of Education: N/A   Occupational History  . Disabled     chronic pain   Social History Main Topics  . Smoking status: Current Every Day Smoker -- 1.50 packs/day    Types: Cigarettes    Last Attempt to Quit: 08/29/2013  . Smokeless tobacco: Never Used  . Alcohol Use: Yes  .  Drug Use: Yes    Special: Cocaine     Comment: stopped cocaine--2003. Normal drug screen March 2008  . Sexual Activity: Yes    Birth Control/ Protection: None   Other Topics Concern  . Not on file   Social History Narrative  . No narrative on file    Past Surgical History  Procedure Laterality Date  . Appendectomy    . Hernia repair    . Nasal sinus surgery  2008    Family History  Problem Relation Age of Onset  . Diabetes Mother   . Hypertension Mother   . Cirrhosis Mother   . Depression Mother   . Bipolar disorder Mother   . Arthritis Father 49    osteoarthritis  . Bipolar disorder Father   . Diabetes Sister     borderline  . Heart disease Paternal Uncle [phone]    MI  . Heart disease Maternal Grandfather     late 60's--MI  . Cancer Paternal Grandmother [phone]    lung  . Bipolar disorder Paternal Grandmother   . Heart disease Paternal Grandfather [phone]    MI  . Bipolar disorder Paternal Grandfather     Allergies  Allergen Reactions  . Ciprofloxacin Hives  .  Hydrocodone     Rash and nausea    Current Outpatient Prescriptions on File Prior to Visit  Medication Sig Dispense Refill  . augmented betamethasone dipropionate (DIPROLENE-AF) 0.05 % cream Apply 1 application topically 2 (two) times daily as needed. rash      . azelastine (ASTELIN) 0.1 % nasal spray Place 2 sprays into both nostrils 2 (two) times daily. Use in each nostril as directed  30 mL  12  . cetirizine (ZYRTEC) 10 MG tablet Take 1 tablet (10 mg total) by mouth daily. For allergies      . DULoxetine (CYMBALTA) 60 MG capsule Take 1 capsule (60 mg total) by mouth daily. For depression  30 capsule  1  . hydrOXYzine (ATARAX/VISTARIL) 50 MG tablet TAKE 1 TABLET BY MOUTH EVERY DAY AS NEEDED FOR ANXIETY  30 tablet  0  . Insulin Glargine (LANTUS SOLOSTAR) 100 UNIT/ML Solostar Pen INJECT 20 UNITS INTO THE SKIN AT BEDTIME FOR GLYCEMIC CONTROL. DX 250.02      . lamoTRIgine (LAMICTAL) 100 MG tablet TAKE 1 TABLET BY  MOUTH ONCE A DAY.  30 tablet  0  . metFORMIN (GLUCOPHAGE) 1000 MG tablet TAKE 1 TABLET BY MOUTH TWICE A DAY AS DIRECTED  60 tablet  3  . omega-3 acid ethyl esters (LOVAZA) 1 G capsule Take 4 capsules (4 g total) by mouth daily. For high cholesterol/fats      . oxyCODONE-acetaminophen (PERCOCET) 7.5-325 MG per tablet Take 1 tablet by mouth every 6 (six) hours as needed. pain      . pioglitazone (ACTOS) 15 MG tablet Take 1 tablet (15 mg total) by mouth daily. For diabetes management  30 tablet  2  . simvastatin (ZOCOR) 40 MG tablet Take 1 tablet (40 mg total) by mouth at bedtime. For high cholesterol  30 tablet  0  . Testosterone (TESTOPEL) 75 MG PLLT by Implant route.      . traZODone (DESYREL) 100 MG tablet Take 1 tablet (100 mg total) by mouth at bedtime. For sleep  30 tablet  1  . [DISCONTINUED] albuterol (PROVENTIL) 90 MCG/ACT inhaler Inhale 2 puffs into the lungs every 6 (six) hours as needed for wheezing.  17 g  12   No current facility-administered medications on file prior to visit.    Review of Systems    See HPI   BP 133/81  Pulse 88  Temp(Src) 98.3 F (36.8 C) (Oral)  Resp 16  Ht 6' (1.829 m)  Wt 188 lb 12.8 oz (85.639 kg)  BMI 25.60 kg/m2  SpO2 100%  Objective:   Physical Exam  Nursing note and vitals reviewed. Constitutional: He is oriented to person, place, and time. He appears well-developed and well-nourished. No distress.  HENT:  Head: Normocephalic.  Eyes: Conjunctivae are normal. Pupils are equal, round, and reactive to light.  Cardiovascular: Normal rate, regular rhythm and normal heart sounds.   Pulmonary/Chest: Effort normal and breath sounds normal.  Abdominal: Soft. Bowel sounds are normal. He exhibits no distension and no mass. There is no hepatosplenomegaly. There is tenderness in the right upper quadrant. There is no rigidity, no rebound and no guarding.  Neurological: He is alert and oriented to person, place, and time.  Skin: Skin is warm and dry.    Psychiatric: He has a normal mood and affect. His behavior is normal. Judgment and thought content normal.            Assessment & Plan:

## 2014-08-11 NOTE — Patient Instructions (Addendum)
Please stop by the lab to have your bloodwork drawn and a urine sample.   Zofran has been sent to your pharmacy.  We will be in touch with you regarding your lab results.  Please plan to follow up in about 1 month.

## 2014-08-11 NOTE — Progress Notes (Signed)
Pre visit review using our clinic review tool, if applicable. No additional management support is needed unless otherwise documented below in the visit note. 

## 2014-08-11 NOTE — Assessment & Plan Note (Addendum)
Pt weight today 188. Decreased intake secondary to nausea. Obtain BMET and urinalysis to r/o potential hepatic involvement. Start zofran for nausea and to see if it helps in increasing intake. Follow up in one month or sooner depending upon lab results.   Lab results indicate hyponatremia (Na 126) with a low urine specific gravity. Refer to ED for treatment of hyponatremia.

## 2014-08-12 ENCOUNTER — Encounter (HOSPITAL_COMMUNITY): Payer: Self-pay | Admitting: Emergency Medicine

## 2014-08-12 ENCOUNTER — Telehealth: Payer: Self-pay | Admitting: Family

## 2014-08-12 ENCOUNTER — Emergency Department (HOSPITAL_COMMUNITY)
Admission: EM | Admit: 2014-08-12 | Discharge: 2014-08-12 | Disposition: A | Discharge status: Home / Self Care | Payer: PRIVATE HEALTH INSURANCE | Attending: Emergency Medicine | Admitting: Emergency Medicine

## 2014-08-12 DIAGNOSIS — Z8719 Personal history of other diseases of the digestive system: Secondary | ICD-10-CM | POA: Diagnosis not present

## 2014-08-12 DIAGNOSIS — M549 Dorsalgia, unspecified: Secondary | ICD-10-CM | POA: Diagnosis not present

## 2014-08-12 DIAGNOSIS — E871 Hypo-osmolality and hyponatremia: Secondary | ICD-10-CM

## 2014-08-12 DIAGNOSIS — R112 Nausea with vomiting, unspecified: Secondary | ICD-10-CM | POA: Diagnosis present

## 2014-08-12 DIAGNOSIS — Z79899 Other long term (current) drug therapy: Secondary | ICD-10-CM | POA: Diagnosis not present

## 2014-08-12 DIAGNOSIS — E870 Hyperosmolality and hypernatremia: Secondary | ICD-10-CM | POA: Diagnosis not present

## 2014-08-12 DIAGNOSIS — Z794 Long term (current) use of insulin: Secondary | ICD-10-CM | POA: Diagnosis not present

## 2014-08-12 DIAGNOSIS — Z720 Tobacco use: Secondary | ICD-10-CM | POA: Insufficient documentation

## 2014-08-12 DIAGNOSIS — E119 Type 2 diabetes mellitus without complications: Secondary | ICD-10-CM | POA: Diagnosis not present

## 2014-08-12 DIAGNOSIS — F329 Major depressive disorder, single episode, unspecified: Secondary | ICD-10-CM | POA: Insufficient documentation

## 2014-08-12 DIAGNOSIS — G894 Chronic pain syndrome: Secondary | ICD-10-CM | POA: Diagnosis not present

## 2014-08-12 LAB — URINALYSIS, ROUTINE W REFLEX MICROSCOPIC
Bilirubin Urine: NEGATIVE
GLUCOSE, UA: NEGATIVE mg/dL
Hgb urine dipstick: NEGATIVE
Ketones, ur: 15 mg/dL — AB
LEUKOCYTES UA: NEGATIVE
Nitrite: NEGATIVE
PH: 6.5 (ref 5.0–8.0)
Protein, ur: NEGATIVE mg/dL
Specific Gravity, Urine: <1.005 — ABNORMAL LOW (ref 1.005–1.030)
Urobilinogen, UA: 0.2 mg/dL (ref 0.0–1.0)

## 2014-08-12 LAB — COMPREHENSIVE METABOLIC PANEL
ALBUMIN: 4.7 g/dL (ref 3.5–5.2)
ALT: 14 U/L (ref 0–53)
AST: 17 U/L (ref 0–37)
Alkaline Phosphatase: 100 U/L (ref 39–117)
Anion gap: 16 — ABNORMAL HIGH (ref 5–15)
BUN: 7 mg/dL (ref 6–23)
CALCIUM: 10.2 mg/dL (ref 8.4–10.5)
CHLORIDE: 90 meq/L — AB (ref 96–112)
CO2: 25 mEq/L (ref 19–32)
Creatinine, Ser: 0.71 mg/dL (ref 0.50–1.35)
GFR calc Af Amer: >90 mL/min (ref >90)
GFR calc non Af Amer: >90 mL/min (ref >90)
Glucose, Bld: 166 mg/dL — ABNORMAL HIGH (ref 70–99)
Potassium: 4.1 mEq/L (ref 3.7–5.3)
Sodium: 131 mEq/L — ABNORMAL LOW (ref 137–147)
Total Bilirubin: 0.7 mg/dL (ref 0.3–1.2)
Total Protein: 8.3 g/dL (ref 6.0–8.3)

## 2014-08-12 MED ORDER — ONDANSETRON HCL 4 MG/2ML IJ SOLN
4.0000 mg | Freq: Once | INTRAMUSCULAR | Status: AC
  Administered 2014-08-12: 4 mg via INTRAVENOUS

## 2014-08-12 MED ORDER — SODIUM CHLORIDE 0.9 % IV SOLN
1000.0000 mL | Freq: Once | INTRAVENOUS | Status: AC
  Administered 2014-08-12: 1000 mL via INTRAVENOUS

## 2014-08-12 MED ORDER — ONDANSETRON HCL 4 MG/2ML IJ SOLN
4.0000 mg | Freq: Once | INTRAMUSCULAR | Status: DC
  Filled 2014-08-12: qty 2

## 2014-08-12 MED ORDER — SODIUM CHLORIDE 0.9 % IV SOLN
1000.0000 mL | INTRAVENOUS | Status: DC
  Administered 2014-08-12: 1000 mL via INTRAVENOUS

## 2014-08-12 NOTE — Discharge Instructions (Signed)
Your sodium has improved from 126 and is now 131. Please resume your meals on a timely basis is much as possible. Please see Ms. Peggyann JubaO'Sullivan next week for recheck of your chemistries. Please return to the emergency apartment if any emergent changes, problems, or concerns. Hyponatremia  Hyponatremia is when the salt (sodium) in your blood is low. When salt becomes low, your cells take in extra water and puff up (swell). The puffiness can happen in the whole body. It mostly affects the brain and is very serious.  HOME CARE  Only take medicine as told by your doctor.  Follow any diet instructions you were given. This includes limiting how much fluid you drink.  Keep all doctor visits for tests as told.  Avoid alcohol and drugs. GET HELP RIGHT AWAY IF:  You start to twitch and shake (seize).  You pass out (faint).  You continue to have watery poop (diarrhea) or you throw up (vomit).  You feel sick to your stomach (nauseous).  You are tired (fatigued), have a headache, are confused, or feel weak.  Your problems that first brought you to the doctor come back.  You have trouble following your diet instructions. MAKE SURE YOU:   Understand these instructions.  Will watch your condition.  Will get help right away if you are not doing well or get worse. Document Released: 07/09/2011 Document Revised: 01/19/2012 Document Reviewed: 07/09/2011 [facility] Patient Information 2015 [employer], [place]. This information is not intended to replace advice given to you by your health care provider. Make sure you discuss any questions you have with your health care provider.

## 2014-08-12 NOTE — Telephone Encounter (Signed)
Spoke to patient re: hyponatremia.  Advised pt to proceed to ED.  He is agreeable and will [facility].

## 2014-08-12 NOTE — ED Provider Notes (Signed)
CSN: [id number]     Arrival date & time 08/12/14  1007 History   First MD Initiated Contact with Patient 08/12/14 1028     Chief Complaint  Patient presents with  . Emesis     (Consider location/radiation/quality/duration/timing/severity/associated sxs/prior Treatment) HPI Comments: Patient is a 38 year old male who presents to the emergency department with a complaint of vomiting and" they said my sodium was low". The patient has a history of chronic nausea vomiting that has been going on for nearly a year. He has had some problems with weight loss. He was seen by his primary care physician on yesterday October 2 at which time he had blood work done. Today the patient was called and told that his sodium was low and she should go to the emergency department for evaluation. The patient denies any recent blood in the vomitus. He has not had recent diarrhea. He has not been traveling outside of the country recently. His been no reported high fevers.  Patient is a 38 y.o. male presenting with vomiting. The history is provided by the patient.  Emesis Associated symptoms: arthralgias   Associated symptoms: no abdominal pain     Past Medical History  Diagnosis Date  . Allergy   . Depression   . GERD (gastroesophageal reflux disease)   . Hyperlipidemia   . Chronic pain disorder     Sees [facility]  . Urinary incontinence     detrusor instability  . Hypogonadism   . Diabetes mellitus     Type II  . Chronic neck pain    Past Surgical History  Procedure Laterality Date  . Appendectomy    . Hernia repair    . Nasal sinus surgery  2008   Family History  Problem Relation Age of Onset  . Diabetes Mother   . Hypertension Mother   . Cirrhosis Mother   . Depression Mother   . Bipolar disorder Mother   . Arthritis Father 49    osteoarthritis  . Bipolar disorder Father   . Diabetes Sister     borderline  . Heart disease Paternal Uncle 63    MI  . Heart disease Maternal  Grandfather     late 60's--MI  . Cancer Paternal Grandmother 108    lung  . Bipolar disorder Paternal Grandmother   . Heart disease Paternal Grandfather 60    MI  . Bipolar disorder Paternal Grandfather    History  Substance Use Topics  . Smoking status: Current Every Day Smoker -- 1.50 packs/day    Types: Cigarettes    Last Attempt to Quit: 08/29/2013  . Smokeless tobacco: Never Used  . Alcohol Use: Yes    Review of Systems  Constitutional: Negative for activity change.       All ROS Neg except as noted in HPI  HENT: Negative for nosebleeds.   Eyes: Negative for photophobia and discharge.  Respiratory: Negative for cough, shortness of breath and wheezing.   Cardiovascular: Negative for chest pain and palpitations.  Gastrointestinal: Positive for nausea and vomiting. Negative for abdominal pain and blood in stool.  Genitourinary: Negative for dysuria, frequency and hematuria.  Musculoskeletal: Positive for arthralgias and back pain. Negative for neck pain.  Skin: Negative.   Allergic/Immunologic: Positive for environmental allergies.  Neurological: Negative for dizziness, seizures and speech difficulty.  Psychiatric/Behavioral: Negative for hallucinations and confusion.      Allergies  Ciprofloxacin and Hydrocodone  Home Medications   Prior to Admission medications   Medication Sig  Start Date End Date Taking? Authorizing Provider  augmented betamethasone dipropionate (DIPROLENE-AF) 0.05 % cream Apply 1 application topically 2 (two) times daily as needed. rash 05/02/14  Yes Historical Provider, MD  azelastine (ASTELIN) 0.1 % nasal spray Place 2 sprays into both nostrils 2 (two) times daily. Use in each nostril as directed 07/24/14  Yes Sandford Craze, NP  cetirizine (ZYRTEC) 10 MG tablet Take 1 tablet (10 mg total) by mouth daily. For allergies 03/23/14  Yes Sanjuana Kava, NP  DULoxetine (CYMBALTA) 60 MG capsule Take 1 capsule (60 mg total) by mouth daily. For depression  07/12/14  Yes Cleotis Nipper, MD  hydrOXYzine (ATARAX/VISTARIL) 50 MG tablet TAKE 1 TABLET BY MOUTH EVERY DAY AS NEEDED FOR ANXIETY 08/08/14  Yes Cleotis Nipper, MD  Insulin Glargine (LANTUS SOLOSTAR) 100 UNIT/ML Solostar Pen INJECT 20 UNITS INTO THE SKIN AT BEDTIME FOR GLYCEMIC CONTROL. DX 250.02 04/14/14  Yes Sandford Craze, NP  lamoTRIgine (LAMICTAL) 100 MG tablet TAKE 1 TABLET BY MOUTH ONCE A DAY. 07/25/14  Yes Cleotis Nipper, MD  metFORMIN (GLUCOPHAGE) 1000 MG tablet TAKE 1 TABLET BY MOUTH TWICE A DAY AS DIRECTED 04/14/14  Yes Sandford Craze, NP  omega-3 acid ethyl esters (LOVAZA) 1 G capsule Take 4 capsules (4 g total) by mouth daily. For high cholesterol/fats 03/23/14  Yes Sanjuana Kava, NP  oxyCODONE-acetaminophen (PERCOCET) 7.5-325 MG per tablet Take 1 tablet by mouth every 6 (six) hours as needed. pain 07/31/14  Yes Historical Provider, MD  pioglitazone (ACTOS) 15 MG tablet Take 1 tablet (15 mg total) by mouth daily. For diabetes management 05/30/14  Yes Sandford Craze, NP  simvastatin (ZOCOR) 40 MG tablet Take 1 tablet (40 mg total) by mouth at bedtime. For high cholesterol 07/12/14  Yes Reather Littler, MD  Testosterone (TESTOPEL) 75 MG PLLT by Implant route.   Yes Historical Provider, MD  traZODone (DESYREL) 100 MG tablet Take 1 tablet (100 mg total) by mouth at bedtime. For sleep 07/12/14  Yes Cleotis Nipper, MD  ondansetron (ZOFRAN) 4 MG tablet Take 1 tablet (4 mg total) by mouth every 8 (eight) hours as needed for nausea or vomiting. 08/11/14   Jeanine Luz, FNP   BP 125/80  Pulse 81  Temp(Src) 97.8 F (36.6 C) (Oral)  Resp 18  Ht 6\' 1"  (1.854 m)  Wt 185 lb (83.915 kg)  BMI 24.41 kg/m2  SpO2 97% Physical Exam  Nursing note and vitals reviewed. Constitutional: He is oriented to person, place, and time. He appears well-developed and well-nourished.  Non-toxic appearance.  HENT:  Head: Normocephalic.  Right Ear: Tympanic membrane and external ear normal.  Left Ear: Tympanic membrane and  external ear normal.  Eyes: EOM and lids are normal. Pupils are equal, round, and reactive to light.  Neck: Normal range of motion. Neck supple. Carotid bruit is not present.  Cardiovascular: Normal rate, regular rhythm, normal heart sounds, intact distal pulses and normal pulses.   Pulmonary/Chest: Breath sounds normal. No respiratory distress. He has no wheezes. He has no rales. He exhibits no tenderness.  Abdominal: Soft. Bowel sounds are normal. He exhibits no mass. There is no tenderness. There is no rebound and no guarding.  Musculoskeletal: Normal range of motion. He exhibits no edema and no tenderness.  Lymphadenopathy:       Head (right side): No submandibular adenopathy present.       Head (left side): No submandibular adenopathy present.    He has no cervical adenopathy.  Neurological: He  is alert and oriented to person, place, and time. He has normal strength. No cranial nerve deficit or sensory deficit.  Skin: Skin is warm and dry.  Psychiatric: He has a normal mood and affect. His speech is normal.    ED Course  Procedures (including critical care time) Labs Review Labs Reviewed  COMPREHENSIVE METABOLIC PANEL - Abnormal; Notable for the following:    Sodium 131 (*)    Chloride 90 (*)    Glucose, Bld 166 (*)    Anion gap 16 (*)    All other components within normal limits  URINALYSIS, ROUTINE W REFLEX MICROSCOPIC - Abnormal; Notable for the following:    Specific Gravity, Urine <1.005 (*)    Ketones, ur 15 (*)    All other components within normal limits    Imaging Review No results found.   EKG Interpretation None      MDM  I have reviewed the labs from October 2. The sodium has improved from 126-131. The glucose today is 166. There is little change in the urine analysis from yesterday, the urine specific gravity remains low at 1.005. The key tones are have improved from 40 to 15.  No vomiting since being in the emergency department and receiving the IV  fluids.  Blood pressure is 136/101, otherwise within normal limits. Pulse oximetry is 100% on room air. Within normal limits by my interpretation. Patient treated in the emergency apartment with a liter of IV fluids. Patient tolerated the IV fluids without problem, states she feels better after the IV fluids.  The plan at this time is for the patient to followup with his primary physician on Monday or Tuesday, October 6, for recheck of the labs. Patient is encouraged to continue his current medications. He is further encouraged to return to the emergency apartment immediately if any changes, problems, or concerns.    Final diagnoses:  None    *I have reviewed nursing notes, vital signs, and all appropriate lab and imaging results for this patient.Kathie Dike**    [name] M Aleshia Cartelli, PA-C 08/12/14 [phone]

## 2014-08-12 NOTE — ED Notes (Signed)
PT was called by PCP this morning and told to come to ED d/t low sodium levels.

## 2014-08-12 NOTE — ED Provider Notes (Signed)
Medical screening examination/treatment/procedure(s) were performed by non-physician practitioner and as supervising physician I was immediately available for consultation/collaboration.  Floreine Kingdon L Sindia Kowalczyk, MD 08/12/14 1624 

## 2014-08-12 NOTE — ED Notes (Signed)
Vomiting off and on  for about a year.  Has lost 40 lbs.  Has been seeing his PCP for this and had lab work yesterday.  Was called this morning and told to come to the er because his sodium was low.

## 2014-08-12 NOTE — ED Notes (Signed)
PA at bedside.

## 2014-08-13 ENCOUNTER — Telehealth: Payer: Self-pay | Admitting: Family

## 2014-08-13 NOTE — Telephone Encounter (Signed)
Please contact pt and have him come see me Monday of Tuesday for ED follow up.

## 2014-08-14 NOTE — Telephone Encounter (Signed)
Spoke to patient regarding this and he states that he will have to call back to schedule appointment

## 2014-08-15 ENCOUNTER — Encounter: Payer: Self-pay | Admitting: Family

## 2014-08-15 ENCOUNTER — Ambulatory Visit (INDEPENDENT_AMBULATORY_CARE_PROVIDER_SITE_OTHER): Payer: PRIVATE HEALTH INSURANCE | Admitting: Family

## 2014-08-15 VITALS — BP 139/80 | HR 99 | Temp 98.1°F | Resp 18 | Ht 72.0 in | Wt 190.4 lb

## 2014-08-15 DIAGNOSIS — E871 Hypo-osmolality and hyponatremia: Secondary | ICD-10-CM | POA: Insufficient documentation

## 2014-08-15 DIAGNOSIS — L0291 Cutaneous abscess, unspecified: Secondary | ICD-10-CM | POA: Insufficient documentation

## 2014-08-15 LAB — BASIC METABOLIC PANEL
BUN: 7 mg/dL (ref 6–23)
CALCIUM: 9.5 mg/dL (ref 8.4–10.5)
CO2: 26 mEq/L (ref 19–32)
Chloride: 99 mEq/L (ref 96–112)
Creatinine, Ser: 0.9 mg/dL (ref 0.4–1.5)
GFR: 98.18 mL/min (ref >60.00)
GLUCOSE: 287 mg/dL — AB (ref 70–99)
Potassium: 4.2 mEq/L (ref 3.5–5.1)
SODIUM: 133 meq/L — AB (ref 135–145)

## 2014-08-15 LAB — OSMOLALITY, URINE: Osmolality, Ur: 217 mOsm/kg — ABNORMAL LOW (ref 390–1090)

## 2014-08-15 LAB — OSMOLALITY: OSMOLALITY: 286 mosm/kg (ref 275–300)

## 2014-08-15 MED ORDER — CEPHALEXIN 500 MG PO CAPS: 500.0000 mg | ORAL_CAPSULE | Freq: Four times a day (QID) | ORAL | Status: DC

## 2014-08-15 MED ORDER — CEPHALEXIN 500 MG PO CAPS: 500.0000 mg | ORAL_CAPSULE | Freq: Two times a day (BID) | ORAL | Status: DC

## 2014-08-15 NOTE — Assessment & Plan Note (Signed)
Trial of keflex. If no improvement or if symptoms worsen, may need I and D.

## 2014-08-15 NOTE — Assessment & Plan Note (Signed)
Will repeat sodium today. Obtain serum and urine osmolality. Limit water intake to 64 oz or less a day.  Follow up in 1 week.

## 2014-08-15 NOTE — Addendum Note (Signed)
Addended by: Regis BillSCATES, SHARON L on: 08/15/2014 09:58 AM   Modules accepted: Orders

## 2014-08-15 NOTE — Progress Notes (Signed)
Subjective:    Patient ID: [name], male    DOB: [birth year], 38 y.o.   MRN: [id number]  HPI  Mr. [name] is a 38 yr old male who presents today for follow up of his hyponatremia.  He was noted to have sodium of 126 on 10/2 and was experiencing nausea that day. He was instructed to go to the ED on 10/3 and follow up sodium was 131.  ED records are reviewed.    Reports that he drinks 6-8 16 oz waters a day + some additional drinks.  He reports that his nausea is improved. He does report some leg cramping.     Lesion- left inner thigh.  Has been present x 1 month.  Denies drainage.  Pt tried to "pop" unsuccessfully with a pin.    Review of Systems    see HPI  Past Medical History  Diagnosis Date  . Allergy   . Depression   . GERD (gastroesophageal reflux disease)   . Hyperlipidemia   . Chronic pain disorder     Sees [facility]  . Urinary incontinence     detrusor instability  . Hypogonadism   . Diabetes mellitus     Type II  . Chronic neck pain     History   Social History  . Marital Status: Married    Spouse Name: N/A    Number of Children: 2  . Years of Education: N/A   Occupational History  . Disabled     chronic pain   Social History Main Topics  . Smoking status: Current Every Day Smoker -- 1.50 packs/day    Types: Cigarettes    Last Attempt to Quit: 08/29/2013  . Smokeless tobacco: Never Used  . Alcohol Use: Yes  . Drug Use: Yes    Special: Cocaine     Comment: stopped cocaine--2003. Normal drug screen March 2008  . Sexual Activity: Yes    Birth Control/ Protection: None   Other Topics Concern  . Not on file   Social History Narrative  . No narrative on file    Past Surgical History  Procedure Laterality Date  . Appendectomy    . Hernia repair    . Nasal sinus surgery  2008    Family History  Problem Relation Age of Onset  . Diabetes Mother   . Hypertension Mother   . Cirrhosis Mother   . Depression Mother   .  Bipolar disorder Mother   . Arthritis Father 49    osteoarthritis  . Bipolar disorder Father   . Diabetes Sister     borderline  . Heart disease Paternal Uncle [phone]    MI  . Heart disease Maternal Grandfather     late 60's--MI  . Cancer Paternal Grandmother 1853    lung  . Bipolar disorder Paternal Grandmother   . Heart disease Paternal Grandfather [id number]    MI  . Bipolar disorder Paternal Grandfather     Allergies  Allergen Reactions  . Ciprofloxacin Hives  . Hydrocodone     Rash and nausea    Current Outpatient Prescriptions on File Prior to Visit  Medication Sig Dispense Refill  . augmented betamethasone dipropionate (DIPROLENE-AF) 0.05 % cream Apply 1 application topically 2 (two) times daily as needed. rash      . azelastine (ASTELIN) 0.1 % nasal spray Place 2 sprays into both nostrils 2 (two) times daily. Use in each nostril as directed  30 mL  12  . cetirizine (  ZYRTEC) 10 MG tablet Take 1 tablet (10 mg total) by mouth daily. For allergies      . DULoxetine (CYMBALTA) 60 MG capsule Take 1 capsule (60 mg total) by mouth daily. For depression  30 capsule  1  . hydrOXYzine (ATARAX/VISTARIL) 50 MG tablet TAKE 1 TABLET BY MOUTH EVERY DAY AS NEEDED FOR ANXIETY  30 tablet  0  . Insulin Glargine (LANTUS SOLOSTAR) 100 UNIT/ML Solostar Pen INJECT 20 UNITS INTO THE SKIN AT BEDTIME FOR GLYCEMIC CONTROL. DX 250.02      . lamoTRIgine (LAMICTAL) 100 MG tablet TAKE 1 TABLET BY MOUTH ONCE A DAY.  30 tablet  0  . metFORMIN (GLUCOPHAGE) 1000 MG tablet TAKE 1 TABLET BY MOUTH TWICE A DAY AS DIRECTED  60 tablet  3  . omega-3 acid ethyl esters (LOVAZA) 1 G capsule Take 4 capsules (4 g total) by mouth daily. For high cholesterol/fats      . ondansetron (ZOFRAN) 4 MG tablet Take 1 tablet (4 mg total) by mouth every 8 (eight) hours as needed for nausea or vomiting.  20 tablet  0  . oxyCODONE-acetaminophen (PERCOCET) 7.5-325 MG per tablet Take 1 tablet by mouth every 6 (six) hours as needed. pain      .  pioglitazone (ACTOS) 15 MG tablet Take 1 tablet (15 mg total) by mouth daily. For diabetes management  30 tablet  2  . simvastatin (ZOCOR) 40 MG tablet Take 1 tablet (40 mg total) by mouth at bedtime. For high cholesterol  30 tablet  0  . Testosterone (TESTOPEL) 75 MG PLLT by Implant route.      . traZODone (DESYREL) 100 MG tablet Take 1 tablet (100 mg total) by mouth at bedtime. For sleep  30 tablet  1  . [DISCONTINUED] albuterol (PROVENTIL) 90 MCG/ACT inhaler Inhale 2 puffs into the lungs every 6 (six) hours as needed for wheezing.  17 g  12   No current facility-administered medications on file prior to visit.    BP 139/80  Pulse 99  Temp(Src) 98.1 F (36.7 C) (Oral)  Resp 18  Ht 6' (1.829 m)  Wt 190 lb 6.4 oz (86.365 kg)  BMI 25.82 kg/m2  SpO2 100%    Objective:   Physical Exam  Constitutional: He is oriented to person, place, and time. He appears well-developed and well-nourished. No distress.  HENT:  Head: Normocephalic and atraumatic.  Cardiovascular: Normal rate and regular rhythm.   No murmur heard. Pulmonary/Chest: Effort normal and breath sounds normal. No respiratory distress. He has no wheezes. He has no rales. He exhibits no tenderness.  Neurological: He is alert and oriented to person, place, and time.  Skin: Skin is warm and dry.  Abscess noted left inner thigh, approximately 1 inch of induration noted beneath skin, minimal tenderness. No fluctuance.    Psychiatric: He has a normal mood and affect. His behavior is normal. Judgment and thought content normal.          Assessment & Plan:

## 2014-08-15 NOTE — Addendum Note (Signed)
Addended by: Silvio PateHOMPSON, Rhilee Currin D on: 08/15/2014 08:37 AM   Modules accepted: Orders

## 2014-08-15 NOTE — Progress Notes (Signed)
Pre visit review using our clinic review tool, if applicable. No additional management support is needed unless otherwise documented below in the visit note. 

## 2014-08-15 NOTE — Patient Instructions (Signed)
Please complete your lab work prior to leaving.  Start keflex for abscess on your leg. Try to limit water intake to 64 ounces a day. Call if you develop recurrent nausea. Follow up in 1 week.

## 2014-08-16 ENCOUNTER — Telehealth: Payer: Self-pay | Admitting: *Deleted

## 2014-08-16 LAB — SODIUM, URINE, RANDOM: SODIUM UR: 28 meq/L

## 2014-08-16 NOTE — Telephone Encounter (Signed)
Notified pt and he voices understanding. Appt r/s for 08/28/14 at 5:30pm.

## 2014-08-16 NOTE — Telephone Encounter (Signed)
Message copied by Kathi SimpersFERGERSON, Liyah Higham A on Wed Aug 16, 2014  4:27 PM ------      Message from: O'SULLIVAN, MELISSA      Created: Wed Aug 16, 2014  7:04 AM       Please let patient know sodium is improving.  He should limit his water intake to no more than 64 ounces a day of fluid/water.  He has follow up in 1 week.  Lets push follow up out to 2 weeks please. ------

## 2014-08-17 ENCOUNTER — Encounter: Payer: Self-pay | Admitting: Endocrinology

## 2014-08-17 ENCOUNTER — Telehealth: Payer: Self-pay | Admitting: Family

## 2014-08-17 ENCOUNTER — Ambulatory Visit (INDEPENDENT_AMBULATORY_CARE_PROVIDER_SITE_OTHER): Payer: PRIVATE HEALTH INSURANCE | Admitting: Endocrinology

## 2014-08-17 ENCOUNTER — Other Ambulatory Visit: Payer: Self-pay | Admitting: *Deleted

## 2014-08-17 ENCOUNTER — Ambulatory Visit (INDEPENDENT_AMBULATORY_CARE_PROVIDER_SITE_OTHER): Payer: PRIVATE HEALTH INSURANCE | Admitting: Psychiatry

## 2014-08-17 ENCOUNTER — Encounter (HOSPITAL_COMMUNITY): Payer: Self-pay | Admitting: Psychiatry

## 2014-08-17 VITALS — BP 133/83 | HR 96 | Ht 73.0 in | Wt 194.0 lb

## 2014-08-17 VITALS — BP 139/76 | HR 99 | Temp 98.3°F | Resp 14 | Ht 72.0 in | Wt 193.0 lb

## 2014-08-17 DIAGNOSIS — E1165 Type 2 diabetes mellitus with hyperglycemia: Secondary | ICD-10-CM

## 2014-08-17 DIAGNOSIS — IMO0002 Reserved for concepts with insufficient information to code with codable children: Secondary | ICD-10-CM

## 2014-08-17 DIAGNOSIS — E785 Hyperlipidemia, unspecified: Secondary | ICD-10-CM

## 2014-08-17 DIAGNOSIS — F319 Bipolar disorder, unspecified: Secondary | ICD-10-CM

## 2014-08-17 LAB — GLUCOSE, RANDOM: GLUCOSE: 248 mg/dL — AB (ref 70–99)

## 2014-08-17 LAB — HEMOGLOBIN A1C: Hgb A1c MFr Bld: 8.5 % — ABNORMAL HIGH (ref 4.6–6.5)

## 2014-08-17 MED ORDER — LAMOTRIGINE 150 MG PO TABS
150.0000 mg | ORAL_TABLET | Freq: Every day | ORAL | Status: DC
Start: 2014-08-17 — End: 2014-09-27

## 2014-08-17 MED ORDER — DULOXETINE HCL 60 MG PO CPEP: 60.0000 mg | ORAL_CAPSULE | Freq: Every day | ORAL | Status: DC

## 2014-08-17 MED ORDER — TRAZODONE HCL 100 MG PO TABS: 100.0000 mg | ORAL_TABLET | Freq: Every day | ORAL | Status: DC

## 2014-08-17 MED ORDER — METFORMIN HCL 1000 MG PO TABS: ORAL_TABLET | ORAL | Status: DC

## 2014-08-17 MED ORDER — PIOGLITAZONE HCL 30 MG PO TABS: 30.0000 mg | ORAL_TABLET | Freq: Every day | ORAL | Status: DC

## 2014-08-17 NOTE — Patient Instructions (Addendum)
Please check blood sugars at least half the time about 2 hours after any meal and times per week on waking up. Please bring blood sugar monitor to each visit  Actos 2 daily  Lantus 24-26 if am sugar is over 140

## 2014-08-17 NOTE — Progress Notes (Signed)
[place] [phone] Progress Note  [name] [id number] 38 y.o.  08/17/2014 3:27 PM  Chief Complaint:  Medication management and followup.      History of Present Illness:  [name] came for his followup appointment .  He has called [place] 2 weeks ago because of increase depression and anxiety.  His wife was diagnosed with cancer and then recently  She had for another man.  The patient was very upset and disturbed and he admitted poor sleep and irritability .  His Lamictal was increased 150 mg .  He is tolerating his medication and denies any side effects.  He has no rash or itching.  Recently he has a lot of health issues .  He was seen in the emergency room because of nausea and find out that he has low sodium.  He was treated and now he is feeling better.  He is taking his medication as prescribed.  Denies any recent drinking or using any illegal substances.  He lost some weight which he believes because of healthy diet, doing regular exercise and watching his calorie intake.  He also started seeing [name] at [facility] for counseling.  He has noticed much improvement in his depression and anxiety off to seeing a therapist.  He had scheduled more appointments.  He is sleeping better.  He still have racing thoughts and some anxiety but he denies any hallucination, paranoia, suicidal thoughts or homicidal thought.  He described his mood has been stable on his medication.  He is social and active.  His energy level is improved.  Denies any anhedonia or any feeling of hopelessness.  Suicidal Ideation: No Plan Formed: No Patient has means to carry out plan: No  Homicidal Ideation: No Plan Formed: No Patient has means to carry out plan: No  Past Psychiatric History/Hospitalization(s) Patient has multiple psychiatric hospital admission in past. His last psychiatric hospitalization was Mar 20, 2014 to Mar 24, 2014.  He was not taking his Lamictal and relapsed into drinking.  His blood  alcohol level was 149 and his UDS is positive for cocaine.  In the past he has hospitalization [facility].  He has history of suicidal thinking and overdose on Klonopin.  He endorsed history of paranoia, anger , substance use , mood swings anger and irritability.  He had tried Depakote, BuSpar, Risperdal, Prozac in recent months but stopped because of the side effects.  He endorsed increased prolactin level which also Risperdal .  He has history of using Xanax, Valium and Klonopin.   Anxiety: Yes Bipolar Disorder: Yes Depression: Yes Mania: Yes Psychosis: Yes Schizophrenia: No Personality Disorder: No Hospitalization for psychiatric illness: Yes History of Electroconvulsive Shock Therapy: No Prior Suicide Attempts: Yes  Medical History; Patient has multiple medical problems.  He has hypertension, diabetes mellitus , asthma, GERD, chronic pain, degenerative disc disease, fibromyalgia, low testosterone, hyperlipidemia and allergic rhinitis.  His primary care physician is Dr. Conley Canal and he see Dr. Dwyane Dee for her diabetes.  Review of Systems: Psychiatric: Agitation: No Hallucination: No Depressed Mood: No Insomnia: Yes Hypersomnia: No Altered Concentration: No Feels Worthless: No Grandiose Ideas: No Belief In Special Powers: No New/Increased Substance Abuse: No Compulsions: No  Neurologic: Headache: No Seizure: No Paresthesias: No    Outpatient Encounter Prescriptions as of 08/17/2014  Medication Sig  . augmented betamethasone dipropionate (DIPROLENE-AF) 0.05 % cream Apply 1 application topically 2 (two) times daily as needed. rash  . azelastine (ASTELIN) 0.1 %  nasal spray Place 2 sprays into both nostrils 2 (two) times daily. Use in each nostril as directed  . cephALEXin (KEFLEX) 500 MG capsule Take 1 capsule (500 mg total) by mouth 2 (two) times daily.  . cetirizine (ZYRTEC) 10 MG tablet Take 1 tablet (10 mg total) by mouth daily. For  allergies  . DULoxetine (CYMBALTA) 60 MG capsule Take 1 capsule (60 mg total) by mouth daily. For depression  . hydrOXYzine (ATARAX/VISTARIL) 50 MG tablet TAKE 1 TABLET BY MOUTH EVERY DAY AS NEEDED FOR ANXIETY  . Insulin Glargine (LANTUS SOLOSTAR) 100 UNIT/ML Solostar Pen INJECT 20 UNITS INTO THE SKIN AT BEDTIME FOR GLYCEMIC CONTROL. DX 250.02  . lamoTRIgine (LAMICTAL) 150 MG tablet Take 1 tablet (150 mg total) by mouth daily.  . metFORMIN (GLUCOPHAGE) 1000 MG tablet TAKE 1 TABLET BY MOUTH TWICE A DAY AS DIRECTED  . omega-3 acid ethyl esters (LOVAZA) 1 G capsule Take 4 capsules (4 g total) by mouth daily. For high cholesterol/fats  . ondansetron (ZOFRAN) 4 MG tablet Take 1 tablet (4 mg total) by mouth every 8 (eight) hours as needed for nausea or vomiting.  Marland Kitchen oxyCODONE-acetaminophen (PERCOCET) 7.5-325 MG per tablet Take 1 tablet by mouth every 6 (six) hours as needed. pain  . pioglitazone (ACTOS) 30 MG tablet Take 1 tablet (30 mg total) by mouth daily.  . simvastatin (ZOCOR) 40 MG tablet Take 1 tablet (40 mg total) by mouth at bedtime. For high cholesterol  . Testosterone (TESTOPEL) 75 MG PLLT by Implant route.  . traZODone (DESYREL) 100 MG tablet Take 1 tablet (100 mg total) by mouth at bedtime. For sleep  . [DISCONTINUED] DULoxetine (CYMBALTA) 60 MG capsule Take 1 capsule (60 mg total) by mouth daily. For depression  . [DISCONTINUED] lamoTRIgine (LAMICTAL) 100 MG tablet TAKE 1 TABLET BY MOUTH ONCE A DAY.  . [DISCONTINUED] metFORMIN (GLUCOPHAGE) 1000 MG tablet TAKE 1 TABLET BY MOUTH TWICE A DAY AS DIRECTED  . [DISCONTINUED] pioglitazone (ACTOS) 15 MG tablet Take 1 tablet (15 mg total) by mouth daily. For diabetes management  . [DISCONTINUED] traZODone (DESYREL) 100 MG tablet Take 1 tablet (100 mg total) by mouth at bedtime. For sleep    Recent Results (from the past 2160 hour(s))  CBC WITH DIFFERENTIAL     Status: None   Collection Time    06/03/14 10:46 AM      Result Value Ref Range   WBC  8.8  4.0 - 10.5 K/uL   RBC 5.22  4.22 - 5.81 MIL/uL   Hemoglobin 15.8  13.0 - 17.0 g/dL   HCT 46.5  39.0 - 52.0 %   MCV 89.1  78.0 - 100.0 fL   MCH 30.3  26.0 - 34.0 pg   MCHC 34.0  30.0 - 36.0 g/dL   RDW 13.5  11.5 - 15.5 %   Platelets 300  150 - 400 K/uL   Neutrophils Relative % 64  43 - 77 %   Neutro Abs 5.6  1.7 - 7.7 K/uL   Lymphocytes Relative 22  12 - 46 %   Lymphs Abs 1.9  0.7 - 4.0 K/uL   Monocytes Relative 10  3 - 12 %   Monocytes Absolute 0.9  0.1 - 1.0 K/uL   Eosinophils Relative 3  0 - 5 %   Eosinophils Absolute 0.3  0.0 - 0.7 K/uL   Basophils Relative 1  0 - 1 %   Basophils Absolute 0.1  0.0 - 0.1 K/uL   Smear Review Criteria  for review not met    TSH     Status: None   Collection Time    06/03/14 10:49 AM      Result Value Ref Range   TSH 1.844  0.350 - 4.500 uIU/mL  BASIC METABOLIC PANEL     Status: Abnormal   Collection Time    08/11/14 12:02 PM      Result Value Ref Range   Sodium 126 (*) 135 - 145 mEq/L   Potassium 4.3  3.5 - 5.1 mEq/L   Chloride 92 (*) 96 - 112 mEq/L   CO2 26  19 - 32 mEq/L   Glucose, Bld 174 (*) 70 - 99 mg/dL   BUN 6  6 - 23 mg/dL   Creatinine, Ser 0.8  0.4 - 1.5 mg/dL   Calcium 9.5  8.4 - 10.5 mg/dL   GFR 120.88  >60.00 mL/min  URINALYSIS     Status: Abnormal   Collection Time    08/11/14 12:02 PM      Result Value Ref Range   Color, Urine YELLOW  Yellow;Lt. Yellow   APPearance CLEAR  Clear   Specific Gravity, Urine <=1.005 (*) 1.000 - 1.030   pH 6.5  5.0 - 8.0   Total Protein, Urine NEGATIVE  Negative   Urine Glucose NEGATIVE  Negative   Ketones, ur 40 (*) Negative   Bilirubin Urine NEGATIVE  Negative   Hgb urine dipstick NEGATIVE  Negative   Urobilinogen, UA 0.2  0.0 - 1.0   Leukocytes, UA NEGATIVE  Negative   Nitrite NEGATIVE  Negative  HEPATIC FUNCTION PANEL     Status: None   Collection Time    08/11/14  4:20 PM      Result Value Ref Range   Total Bilirubin 0.7  0.2 - 1.2 mg/dL   Bilirubin, Direct 0.1  0.0 - 0.3  mg/dL   Alkaline Phosphatase 78  39 - 117 U/L   AST 16  0 - 37 U/L   ALT 17  0 - 53 U/L   Total Protein 7.8  6.0 - 8.3 g/dL   Albumin 4.5  3.5 - 5.2 g/dL  COMPREHENSIVE METABOLIC PANEL     Status: Abnormal   Collection Time    08/12/14 10:45 AM      Result Value Ref Range   Sodium 131 (*) 137 - 147 mEq/L   Potassium 4.1  3.7 - 5.3 mEq/L   Chloride 90 (*) 96 - 112 mEq/L   CO2 25  19 - 32 mEq/L   Glucose, Bld 166 (*) 70 - 99 mg/dL   BUN 7  6 - 23 mg/dL   Creatinine, Ser 0.71  0.50 - 1.35 mg/dL   Calcium 10.2  8.4 - 10.5 mg/dL   Total Protein 8.3  6.0 - 8.3 g/dL   Albumin 4.7  3.5 - 5.2 g/dL   AST 17  0 - 37 U/L   ALT 14  0 - 53 U/L   Alkaline Phosphatase 100  39 - 117 U/L   Total Bilirubin 0.7  0.3 - 1.2 mg/dL   GFR calc non Af Amer >90  >90 mL/min   GFR calc Af Amer >90  >90 mL/min   Comment: (NOTE)     The eGFR has been calculated using the CKD EPI equation.     This calculation has not been validated in all clinical situations.     eGFR's persistently <90 mL/min signify possible Chronic Kidney     Disease.  Anion gap 16 (*) 5 - 15  URINALYSIS, ROUTINE W REFLEX MICROSCOPIC     Status: Abnormal   Collection Time    08/12/14 11:15 AM      Result Value Ref Range   Color, Urine YELLOW  YELLOW   APPearance CLEAR  CLEAR   Specific Gravity, Urine <1.005 (*) 1.005 - 1.030   pH 6.5  5.0 - 8.0   Glucose, UA NEGATIVE  NEGATIVE mg/dL   Hgb urine dipstick NEGATIVE  NEGATIVE   Bilirubin Urine NEGATIVE  NEGATIVE   Ketones, ur 15 (*) NEGATIVE mg/dL   Protein, ur NEGATIVE  NEGATIVE mg/dL   Urobilinogen, UA 0.2  0.0 - 1.0 mg/dL   Nitrite NEGATIVE  NEGATIVE   Leukocytes, UA NEGATIVE  NEGATIVE   Comment: MICROSCOPIC NOT DONE ON URINES WITH NEGATIVE PROTEIN, BLOOD, LEUKOCYTES, NITRITE, OR GLUCOSE <1000 mg/dL.  OSMOLALITY     Status: None   Collection Time    08/15/14  8:24 AM      Result Value Ref Range   Osmolality 286  275 - 300 mOsm/kg  OSMOLALITY, URINE     Status: Abnormal    Collection Time    08/15/14  8:24 AM      Result Value Ref Range   Osmolality, Ur 217 (*) 390 - 1090 mOsm/kg  SODIUM, URINE, RANDOM     Status: None   Collection Time    08/15/14  8:24 AM      Result Value Ref Range   Sodium, Ur 28    BASIC METABOLIC PANEL     Status: Abnormal   Collection Time    08/15/14  8:36 AM      Result Value Ref Range   Sodium 133 (*) 135 - 145 mEq/L   Potassium 4.2  3.5 - 5.1 mEq/L   Chloride 99  96 - 112 mEq/L   CO2 26  19 - 32 mEq/L   Glucose, Bld 287 (*) 70 - 99 mg/dL   BUN 7  6 - 23 mg/dL   Creatinine, Ser 0.9  0.4 - 1.5 mg/dL   Calcium 9.5  8.4 - 96.2 mg/dL   GFR 67.05  >18.63 mL/min  HEMOGLOBIN A1C     Status: Abnormal   Collection Time    08/17/14  9:30 AM      Result Value Ref Range   Hemoglobin A1C 8.5 (*) 4.6 - 6.5 %   Comment: Glycemic Control Guidelines for People with Diabetes:Non Diabetic:  <6%Goal of Therapy: <7%Additional Action Suggested:  >8%   GLUCOSE, RANDOM     Status: Abnormal   Collection Time    08/17/14  9:30 AM      Result Value Ref Range   Glucose, Bld 248 (*) 70 - 99 mg/dL      Physical Exam: Constitutional:  BP 133/83  Pulse 96  Ht 6\' 1"  (1.854 m)  Wt 194 lb (87.998 kg)  BMI 25.60 kg/m2  Musculoskeletal: Strength & Muscle Tone: within normal limits Gait & Station: normal Patient leans: N/A  Mental Status Examination;  Patient is casually dressed and fairly groomed.  He maintains fair eye contact.  His speech is slow but coherent.  He described his mood is  Okay nd his affect is  appropriate.  He denies any paranoia , delusions or any excessive thoughts.  He denies any active or passive suicidal thoughts or homicidal thoughts.  His attention and concentration is okay.  His thought process is logical and goal-directed.  His psychomotor activity  is normal.  His fund of knowledge is average.  He is alert and oriented x3.  There were no tremors or shakes present.  His insight judgment and impulse control is  good.   Established Problem, Stable/Improving (1), Review of Psycho-Social Stressors (1), Review or order clinical lab tests (1), Review of Last Therapy Session (1), Review of Medication Regimen & Side Effects (2) and Review of New Medication or Change in Dosage (2)  Assessment: Axis I: Bipolar disorder NOS, rule out posttraumatic stress disorder  Axis II: Deferred  Axis III:  Past Medical History  Diagnosis Date  . Allergy   . Depression   . GERD (gastroesophageal reflux disease)   . Hyperlipidemia   . Chronic pain disorder     Sees [facility]  . Urinary incontinence     detrusor instability  . Hypogonadism   . Diabetes mellitus     Type II  . Chronic neck pain     Axis IV: Mild to moderate   Plan:  Ptient is doing better on his current medication despite increase in his stressors.  He is seeing counselor and to increase coping and social skills.  I will continue trazodone 100 mg at bedtime, Cymbalta 60 mg daily and a new dose of Lamictal at 50 mg daily.  Patient does not have any rash or itching.  Recommended to keep appointment with therapist with coping and social skills.  I reviewed his blood work, his sodium level is improved from the past.  He does not have any nausea or abdominal pain.  Discussed risks and benefits of medication.  Recommended to call us back  If he has any questions or any concern.  I will see him again in 3 months. Time spent 25 minutes.  More than 50% of the time spent in psychoeducation, counseling and coordination of care.  Discuss safety plan that anytime having active suicidal thoughts or homicidal thoughts then patient need to call 911 or go to the local emergency room.  ARFEEN,SYED T., MD 08/17/2014

## 2014-08-17 NOTE — Progress Notes (Signed)
[name] 37 y.o.    Reason for Appointment : Followup for Type 2 Diabetes   History of Present Illness          Diagnosis: Type 2 diabetes mellitus, date of diagnosis: 2005     Past history: He had previously been treated with oral hypoglycemic agents but required insulin in 2009 when he had marked hyperglycemia with ketonuria. Subsequently has been on insulin, continuously since about 2010, usually basal bolus regimen. Also appeared to improve control but using Actoplusmet. However Actos was stopped by his cardiologist because of Edema. In 2011 he was taking only about 30 units of Lantus. Subsequently he required progressively higher doses of Lantus without adequate overnight glucose control. He thinks his A1c had been usually over 9% but no detailed records available However in 09/2013 his A1c was up to 10.4%  Recent history:   He has again been noncompliant with his followup and has not been seen in 9 months He typically has had poor control of his diabetes requiring insulin No recent A1c is available He thinks that because of his weight loss he was starting to get low sugars overnight and he progressively reduced and stopped some of his insulin doses; previously was on NPH along with his Lantus at night and also mealtime NovoLog He thinks that because of low sugars 2-3 months ago he had to reduce his Lantus down to 20 units from the 30 units regimen However he has been having a lot of retinal issues and stress and has not checked his blood sugar much He also has had periodic steroid doses including about a month ago and not clear if sugars are higher because of this He continues to take metformin and Actos He thinks his fasting readings are fairly good but had a glucose of 287 in the lab recently, possibly from a missed dose of Lantus Postprandial Readings have not been checked    INSULIN regimen is described as:  Lantus 20 hs, now 20 for 2-3 months   Oral hypoglycemic drugs  the patient is taking are: Metformin 1 g twice a day and Actos 15 mg       Glucose monitoring:  done  2-3  times a day         Glucometer:  Contour    Blood Glucose readings, checking irregularly, range about 120-160 in am Hypoglycemia: None          Self-care: The diet that the patient has been following is: Variable   Meals: 3 meals per day.  avoiding drinks with sugar, small portions usually Physical activity: exercise: more active overall, no formal exercise      Dietician visit: Most recent: Unknown           Wt Readings from Last 3 Encounters:  08/17/14 193 lb (87.544 kg)  08/15/14 190 lb 6.4 oz (86.365 kg)  08/12/14 185 lb (83.915 kg)   Retinal exam: Most recent: 2014, normal  Lab Results  Component Value Date   HGBA1C 10.4* 10/03/2013   HGBA1C 9.5* 06/14/2013   HGBA1C 9.8* 08/13/2012   Lab Results  Component Value Date   MICROALBUR 1.4 11/30/2013   LDLCALC 74 02/04/2012   CREATININE 0.9 08/15/2014     PROBLEM #2: ? HYPOGONADISM:  No fatigue  He had been treated by his urologist Dr Pete Glatter for about 2 years for a diagnosis of hypogonadism with  Testopel He had not been back for treatment for a year and a half;  in  October his prolactin level was high at 43 His total testosterone was minimally low at 330 and LH was 5.1  He was taking Risperdal  when he was tested for the prolactin He complains of feeling tired although is feeling better now  He also has lost weight  in 2014 Recent testosterone level is still relatively low; now he is taking Lamictal instead of Risperdal  Lab Results  Component Value Date   TESTOSTERONE 250* 10/21/2013       Medication List       This list is accurate as of: 08/17/14  9:15 AM.  Always use your most recent med list.               augmented betamethasone dipropionate 0.05 % cream  Commonly known as:  DIPROLENE-AF  Apply 1 application topically 2 (two) times daily as needed. rash     azelastine 0.1 % nasal spray  Commonly  known as:  ASTELIN  Place 2 sprays into both nostrils 2 (two) times daily. Use in each nostril as directed     cephALEXin 500 MG capsule  Commonly known as:  KEFLEX  Take 1 capsule (500 mg total) by mouth 2 (two) times daily.     cetirizine 10 MG tablet  Commonly known as:  ZYRTEC  Take 1 tablet (10 mg total) by mouth daily. For allergies     DULoxetine 60 MG capsule  Commonly known as:  CYMBALTA  Take 1 capsule (60 mg total) by mouth daily. For depression     hydrOXYzine 50 MG tablet  Commonly known as:  ATARAX/VISTARIL  TAKE 1 TABLET BY MOUTH EVERY DAY AS NEEDED FOR ANXIETY     lamoTRIgine 100 MG tablet  Commonly known as:  LAMICTAL  TAKE 1 TABLET BY MOUTH ONCE A DAY.     LANTUS SOLOSTAR 100 UNIT/ML Solostar Pen  Generic drug:  Insulin Glargine  INJECT 20 UNITS INTO THE SKIN AT BEDTIME FOR GLYCEMIC CONTROL. DX 250.02     metFORMIN 1000 MG tablet  Commonly known as:  GLUCOPHAGE  TAKE 1 TABLET BY MOUTH TWICE A DAY AS DIRECTED     omega-3 acid ethyl esters 1 G capsule  Commonly known as:  LOVAZA  Take 4 capsules (4 g total) by mouth daily. For high cholesterol/fats     ondansetron 4 MG tablet  Commonly known as:  ZOFRAN  Take 1 tablet (4 mg total) by mouth every 8 (eight) hours as needed for nausea or vomiting.     oxyCODONE-acetaminophen 7.5-325 MG per tablet  Commonly known as:  PERCOCET  Take 1 tablet by mouth every 6 (six) hours as needed. pain     pioglitazone 15 MG tablet  Commonly known as:  ACTOS  Take 1 tablet (15 mg total) by mouth daily. For diabetes management     simvastatin 40 MG tablet  Commonly known as:  ZOCOR  Take 1 tablet (40 mg total) by mouth at bedtime. For high cholesterol     TESTOPEL 75 MG Pllt  Generic drug:  Testosterone  by Implant route.     traZODone 100 MG tablet  Commonly known as:  DESYREL  Take 1 tablet (100 mg total) by mouth at bedtime. For sleep        Allergies:  Allergies  Allergen Reactions  . Ciprofloxacin Hives   . Hydrocodone     Rash and nausea    Past Medical History  Diagnosis Date  . Allergy   . Depression   .  GERD (gastroesophageal reflux disease)   . Hyperlipidemia   . Chronic pain disorder     Sees [facility]  . Urinary incontinence     detrusor instability  . Hypogonadism   . Diabetes mellitus     Type II  . Chronic neck pain     Past Surgical History  Procedure Laterality Date  . Appendectomy    . Hernia repair    . Nasal sinus surgery  2008    Family History  Problem Relation Age of Onset  . Diabetes Mother   . Hypertension Mother   . Cirrhosis Mother   . Depression Mother   . Bipolar disorder Mother   . Arthritis Father 49    osteoarthritis  . Bipolar disorder Father   . Diabetes Sister     borderline  . Heart disease Paternal Uncle [id number]    MI  . Heart disease Maternal Grandfather     late 60's--MI  . Cancer Paternal Grandmother [id number]    lung  . Bipolar disorder Paternal Grandmother   . Heart disease Paternal Grandfather [phone]    MI  . Bipolar disorder Paternal Grandfather     Social History:  reports that he has been smoking Cigarettes.  He has been smoking about 1.50 packs per day. He has never used smokeless tobacco. He reports that he drinks alcohol. He reports that he uses illicit drugs (Cocaine).    Review of Systems       Lipids: Long-standing history of hyperlipidemia, primarily high triglycerides. On treatment with 20 mg simvastatin, fish oil  Lab Results  Component Value Date   CHOL 215* 11/30/2013   HDL 44.80 11/30/2013   LDLCALC 74 02/04/2012   LDLDIRECT 145.6 11/30/2013   TRIG 207.0* 11/30/2013   CHOLHDL 5 11/30/2013      He has long-standing depression, he had been on Risperdal twice a day but is now on Lamictal . On Cymbalta for quite some time also   He recently had hyponatremia with sodium 126 and PCP he has reduced his fluid intake, last sodium was 133 with relatively low urine osmolality      Has  Numbness, tingling or  burning in  feet  Was on lyrica previously but this was for fibromyalgia  Had lost weight, no history of thyroid disease  Lab Results  Component Value Date   TSH 1.844 06/03/2014    He has had hypogonadism treated by urologist. Has had Testopel and his last 2 testosterone levels were in the 500-600 range. He will be followed up tomorrow    Physical Examination:  BP 139/76  Pulse 99  Temp(Src) 98.3 F (36.8 C)  Resp 14  Ht 6' (1.829 m)  Wt 193 lb (87.544 kg)  BMI 26.17 kg/m2  SpO2 98%   no ankle edema  ASSESSMENT:  Diabetes type 2, uncontrolled      He has had long-standing diabetes which is again not well controlled However difficult to assess his level of control since he has not checked his sugar much and no A1c done recently Previously on a multiple drug regimen of Actos as well as bedtime NPH, Lantus and NovoLog regimen.  Although his weight has gone down significantly he appears to have relatively high readings on his labs  Also not clear what his postprandial readings are  Most likely will need to get back on basal bolus insulin regimen   HYPERLIPIDEMIA: He has had diabetic dyslipidemia with high triglycerides in the past.  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 He will be followed up by PCP also  PLAN:   He was recommended better compliance with all self-care measures including glucose monitoring Since he has lost a lot of weight and has not had side effects with edema from Actos we can try 30 mg dosage He was instructed on starting back his NovoLog if postprandial readings are high  Will adjust Lantus based on his fasting blood sugar patterns Also consider consultation with dietitian Encourage regular walking for exercise  Counseling time over 50% of today's 25 minute  visit   [name] 08/17/2014, 9:15 AM

## 2014-08-17 NOTE — Telephone Encounter (Signed)
Caller name: Tinnie GensJeffrey Relation to pt: self Call back number: [phone] Pharmacy: [facility] [place]  Reason for call:   Patient is requesting a refill of the [facility] for his rash to be sent to the pharmacy

## 2014-08-18 ENCOUNTER — Telehealth: Payer: Self-pay | Admitting: Family

## 2014-08-18 MED ORDER — BETAMETHASONE DIPROPIONATE AUG 0.05 % EX CREA: 1.0000 "application " | TOPICAL_CREAM | Freq: Two times a day (BID) | CUTANEOUS | Status: DC | PRN

## 2014-08-18 NOTE — Telephone Encounter (Signed)
emmi emailed °

## 2014-08-21 ENCOUNTER — Ambulatory Visit: Payer: Self-pay | Admitting: Family

## 2014-08-21 ENCOUNTER — Ambulatory Visit (HOSPITAL_COMMUNITY): Payer: Self-pay | Admitting: Psychiatry

## 2014-08-21 ENCOUNTER — Ambulatory Visit: Payer: PRIVATE HEALTH INSURANCE | Admitting: Licensed Clinical Social Worker

## 2014-08-22 ENCOUNTER — Telehealth: Payer: Self-pay | Admitting: Family

## 2014-08-22 MED ORDER — ALBUTEROL SULFATE HFA 108 (90 BASE) MCG/ACT IN AERS: 2.0000 | INHALATION_SPRAY | Freq: Four times a day (QID) | RESPIRATORY_TRACT | Status: DC | PRN

## 2014-08-22 NOTE — Telephone Encounter (Signed)
Inhaler sent. Needs office visit if symptoms worsen or if symptoms do not improve with use of albuterol as needed.

## 2014-08-22 NOTE — Telephone Encounter (Signed)
Notified pt and he voices understanding. 

## 2014-08-22 NOTE — Telephone Encounter (Signed)
Caller name: Finas Relation to pt: self Call back number: [phone] [facility] [place]  Reason for call:   Patient states that he has been at a job site and that there is mold there. He would like to know if Efraim Kaufmannmelissa would call him in an inhaler for this

## 2014-08-22 NOTE — Telephone Encounter (Signed)
Pt has appt with us on Monday.

## 2014-08-23 ENCOUNTER — Ambulatory Visit: Payer: PRIVATE HEALTH INSURANCE | Admitting: Licensed Clinical Social Worker

## 2014-08-28 ENCOUNTER — Ambulatory Visit: Payer: Self-pay | Admitting: Family

## 2014-08-30 ENCOUNTER — Ambulatory Visit: Payer: PRIVATE HEALTH INSURANCE | Admitting: Licensed Clinical Social Worker

## 2014-09-07 ENCOUNTER — Telehealth: Payer: Self-pay | Admitting: *Deleted

## 2014-09-07 ENCOUNTER — Ambulatory Visit: Payer: PRIVATE HEALTH INSURANCE | Admitting: Family

## 2014-09-07 ENCOUNTER — Encounter: Payer: Self-pay | Admitting: Family

## 2014-09-07 NOTE — Telephone Encounter (Signed)
Letter sent.  bw 

## 2014-09-07 NOTE — Telephone Encounter (Signed)
Pt did not show for appointment 09/07/2014 at 5:15pm for 1 week follow up

## 2014-09-07 NOTE — Telephone Encounter (Signed)
Please send pt no show letter. 

## 2014-09-11 ENCOUNTER — Ambulatory Visit: Payer: PRIVATE HEALTH INSURANCE | Admitting: Licensed Clinical Social Worker

## 2014-09-13 ENCOUNTER — Other Ambulatory Visit: Payer: Self-pay

## 2014-09-15 ENCOUNTER — Ambulatory Visit: Payer: PRIVATE HEALTH INSURANCE | Admitting: Family

## 2014-09-15 ENCOUNTER — Telehealth: Payer: Self-pay | Admitting: *Deleted

## 2014-09-15 NOTE — Telephone Encounter (Signed)
Pt requesting refill of tizanidine 4mg . Last refill on med history 06/2014.  Has neck and shoulder pain flare up again. Wants Rx for prednisone dose pack.  Please advise.

## 2014-09-15 NOTE — Telephone Encounter (Signed)
Notified pt and scheduled f/u for 09/18/14 at 4pm.

## 2014-09-15 NOTE — Telephone Encounter (Signed)
Pt did not show for appointment 09/15/2014 at 9:30am for 3 month follow up

## 2014-09-15 NOTE — Telephone Encounter (Signed)
He called and had a personal emergency.  Please do not charge no-show.

## 2014-09-15 NOTE — Telephone Encounter (Signed)
He has cancelled and no showed multiple appointments recently.  He will need office visit for additional refills.

## 2014-09-18 ENCOUNTER — Encounter (HOSPITAL_COMMUNITY): Payer: Self-pay | Admitting: Emergency Medicine

## 2014-09-18 ENCOUNTER — Telehealth: Payer: Self-pay | Admitting: *Deleted

## 2014-09-18 ENCOUNTER — Ambulatory Visit: Payer: PRIVATE HEALTH INSURANCE | Admitting: Family

## 2014-09-18 ENCOUNTER — Emergency Department (EMERGENCY_DEPARTMENT_HOSPITAL)
Admission: EM | Admit: 2014-09-18 | Discharge: 2014-09-19 | Disposition: A | Discharge status: Other Acute Inpatient Hospital | Payer: 59 | Source: Home / Self Care | Attending: Emergency Medicine | Admitting: Emergency Medicine

## 2014-09-18 ENCOUNTER — Ambulatory Visit: Payer: Self-pay | Admitting: Endocrinology

## 2014-09-18 DIAGNOSIS — F329 Major depressive disorder, single episode, unspecified: Secondary | ICD-10-CM | POA: Diagnosis not present

## 2014-09-18 DIAGNOSIS — F319 Bipolar disorder, unspecified: Secondary | ICD-10-CM | POA: Insufficient documentation

## 2014-09-18 DIAGNOSIS — R45850 Homicidal ideations: Secondary | ICD-10-CM | POA: Diagnosis present

## 2014-09-18 DIAGNOSIS — E230 Hypopituitarism: Secondary | ICD-10-CM | POA: Insufficient documentation

## 2014-09-18 DIAGNOSIS — G8929 Other chronic pain: Secondary | ICD-10-CM

## 2014-09-18 DIAGNOSIS — E119 Type 2 diabetes mellitus without complications: Secondary | ICD-10-CM | POA: Insufficient documentation

## 2014-09-18 DIAGNOSIS — E785 Hyperlipidemia, unspecified: Secondary | ICD-10-CM

## 2014-09-18 DIAGNOSIS — Z792 Long term (current) use of antibiotics: Secondary | ICD-10-CM

## 2014-09-18 DIAGNOSIS — F1420 Cocaine dependence, uncomplicated: Secondary | ICD-10-CM | POA: Diagnosis present

## 2014-09-18 DIAGNOSIS — F1410 Cocaine abuse, uncomplicated: Secondary | ICD-10-CM | POA: Insufficient documentation

## 2014-09-18 DIAGNOSIS — Z79899 Other long term (current) drug therapy: Secondary | ICD-10-CM

## 2014-09-18 DIAGNOSIS — R45851 Suicidal ideations: Secondary | ICD-10-CM | POA: Diagnosis present

## 2014-09-18 DIAGNOSIS — Z720 Tobacco use: Secondary | ICD-10-CM | POA: Insufficient documentation

## 2014-09-18 DIAGNOSIS — Z794 Long term (current) use of insulin: Secondary | ICD-10-CM

## 2014-09-18 DIAGNOSIS — M549 Dorsalgia, unspecified: Secondary | ICD-10-CM | POA: Diagnosis present

## 2014-09-18 DIAGNOSIS — Z8719 Personal history of other diseases of the digestive system: Secondary | ICD-10-CM

## 2014-09-18 DIAGNOSIS — F1020 Alcohol dependence, uncomplicated: Secondary | ICD-10-CM | POA: Diagnosis present

## 2014-09-18 DIAGNOSIS — F17210 Nicotine dependence, cigarettes, uncomplicated: Secondary | ICD-10-CM | POA: Diagnosis present

## 2014-09-18 DIAGNOSIS — F332 Major depressive disorder, recurrent severe without psychotic features: Secondary | ICD-10-CM | POA: Diagnosis present

## 2014-09-18 DIAGNOSIS — G4700 Insomnia, unspecified: Secondary | ICD-10-CM | POA: Diagnosis present

## 2014-09-18 DIAGNOSIS — E1165 Type 2 diabetes mellitus with hyperglycemia: Secondary | ICD-10-CM | POA: Diagnosis present

## 2014-09-18 HISTORY — DX: Bipolar disorder, unspecified: F31.9

## 2014-09-18 LAB — CBC
HEMATOCRIT: 37.2 % — AB (ref 39.0–52.0)
Hemoglobin: 13.1 g/dL (ref 13.0–17.0)
MCH: 30.3 pg (ref 26.0–34.0)
MCHC: 35.2 g/dL (ref 30.0–36.0)
MCV: 85.9 fL (ref 78.0–100.0)
Platelets: 374 10*3/uL (ref 150–400)
RBC: 4.33 MIL/uL (ref 4.22–5.81)
RDW: 13.5 % (ref 11.5–15.5)
WBC: 11.8 10*3/uL — ABNORMAL HIGH (ref 4.0–10.5)

## 2014-09-18 LAB — RAPID URINE DRUG SCREEN, HOSP PERFORMED
AMPHETAMINES: NOT DETECTED
Barbiturates: NOT DETECTED
Benzodiazepines: NOT DETECTED
Cocaine: POSITIVE — AB
Opiates: NOT DETECTED
TETRAHYDROCANNABINOL: NOT DETECTED

## 2014-09-18 LAB — COMPREHENSIVE METABOLIC PANEL
ALBUMIN: 3.9 g/dL (ref 3.5–5.2)
ALK PHOS: 75 U/L (ref 39–117)
ALT: 18 U/L (ref 0–53)
ANION GAP: 17 — AB (ref 5–15)
AST: 17 U/L (ref 0–37)
BUN: 3 mg/dL — AB (ref 6–23)
CALCIUM: 9.8 mg/dL (ref 8.4–10.5)
CO2: 24 mEq/L (ref 19–32)
Chloride: 93 mEq/L — ABNORMAL LOW (ref 96–112)
Creatinine, Ser: 0.57 mg/dL (ref 0.50–1.35)
GFR calc Af Amer: >90 mL/min (ref >90)
GFR calc non Af Amer: >90 mL/min (ref >90)
Glucose, Bld: 141 mg/dL — ABNORMAL HIGH (ref 70–99)
Potassium: 4.1 mEq/L (ref 3.7–5.3)
Sodium: 134 mEq/L — ABNORMAL LOW (ref 137–147)
TOTAL PROTEIN: 7.3 g/dL (ref 6.0–8.3)
Total Bilirubin: 0.3 mg/dL (ref 0.3–1.2)

## 2014-09-18 LAB — CBG MONITORING, ED: GLUCOSE-CAPILLARY: 141 mg/dL — AB (ref 70–99)

## 2014-09-18 LAB — ETHANOL: Alcohol, Ethyl (B): 66 mg/dL — ABNORMAL HIGH (ref 0–11)

## 2014-09-18 LAB — SALICYLATE LEVEL: Salicylate Lvl: <2 mg/dL — ABNORMAL LOW (ref 2.8–20.0)

## 2014-09-18 LAB — ACETAMINOPHEN LEVEL

## 2014-09-18 MED ORDER — LORAZEPAM 1 MG PO TABS
1.0000 mg | ORAL_TABLET | Freq: Three times a day (TID) | ORAL | Status: DC | PRN
Start: 2014-09-18 — End: 2014-09-19
  Administered 2014-09-18 – 2014-09-19 (×2): 1 mg via ORAL
  Filled 2014-09-18 (×2): qty 1

## 2014-09-18 MED ORDER — NICOTINE 21 MG/24HR TD PT24
21.0000 mg | MEDICATED_PATCH | Freq: Every day | TRANSDERMAL | Status: DC
  Administered 2014-09-18 – 2014-09-19 (×2): 21 mg via TRANSDERMAL
  Filled 2014-09-18 (×2): qty 1

## 2014-09-18 MED ORDER — SIMVASTATIN 40 MG PO TABS
40.0000 mg | ORAL_TABLET | Freq: Every day | ORAL | Status: DC
  Administered 2014-09-18 – 2014-09-19 (×2): 40 mg via ORAL
  Filled 2014-09-18 (×2): qty 1

## 2014-09-18 MED ORDER — METFORMIN HCL 500 MG PO TABS
1000.0000 mg | ORAL_TABLET | Freq: Two times a day (BID) | ORAL | Status: DC
  Administered 2014-09-19 (×2): 1000 mg via ORAL
  Filled 2014-09-18 (×3): qty 2

## 2014-09-18 MED ORDER — HYDROXYZINE HCL 25 MG PO TABS
50.0000 mg | ORAL_TABLET | Freq: Every day | ORAL | Status: DC | PRN
  Administered 2014-09-19: 50 mg via ORAL
  Filled 2014-09-18: qty 2

## 2014-09-18 MED ORDER — TRAZODONE HCL 100 MG PO TABS
100.0000 mg | ORAL_TABLET | Freq: Every day | ORAL | Status: DC
  Administered 2014-09-18: 100 mg via ORAL
  Filled 2014-09-18: qty 1

## 2014-09-18 MED ORDER — LAMOTRIGINE 150 MG PO TABS
150.0000 mg | ORAL_TABLET | Freq: Every day | ORAL | Status: DC
  Administered 2014-09-18 – 2014-09-19 (×2): 150 mg via ORAL
  Filled 2014-09-18 (×2): qty 1

## 2014-09-18 MED ORDER — ZOLPIDEM TARTRATE 5 MG PO TABS
5.0000 mg | ORAL_TABLET | Freq: Every evening | ORAL | Status: DC | PRN
Start: 2014-09-18 — End: 2014-09-19
  Administered 2014-09-18: 5 mg via ORAL
  Filled 2014-09-18: qty 1

## 2014-09-18 MED ORDER — LORATADINE 10 MG PO TABS
10.0000 mg | ORAL_TABLET | Freq: Every day | ORAL | Status: DC
  Administered 2014-09-18 – 2014-09-19 (×2): 10 mg via ORAL
  Filled 2014-09-18 (×2): qty 1

## 2014-09-18 MED ORDER — DULOXETINE HCL 60 MG PO CPEP
60.0000 mg | ORAL_CAPSULE | Freq: Every day | ORAL | Status: DC
  Administered 2014-09-18 – 2014-09-19 (×2): 60 mg via ORAL
  Filled 2014-09-18 (×2): qty 1

## 2014-09-18 MED ORDER — OXYCODONE-ACETAMINOPHEN 7.5-325 MG PO TABS: 1.0000 | ORAL_TABLET | Freq: Four times a day (QID) | ORAL | Status: DC | PRN

## 2014-09-18 MED ORDER — OXYCODONE-ACETAMINOPHEN 5-325 MG PO TABS
1.0000 | ORAL_TABLET | Freq: Four times a day (QID) | ORAL | Status: DC | PRN
Start: 2014-09-18 — End: 2014-09-19
  Administered 2014-09-18 – 2014-09-19 (×3): 1 via ORAL
  Filled 2014-09-18 (×3): qty 1

## 2014-09-18 MED ORDER — ONDANSETRON HCL 4 MG PO TABS: 4.0000 mg | ORAL_TABLET | Freq: Three times a day (TID) | ORAL | Status: DC | PRN

## 2014-09-18 MED ORDER — PIOGLITAZONE HCL 30 MG PO TABS
30.0000 mg | ORAL_TABLET | Freq: Every day | ORAL | Status: DC
  Administered 2014-09-19: 30 mg via ORAL
  Filled 2014-09-18 (×2): qty 1

## 2014-09-18 MED ORDER — ALBUTEROL SULFATE HFA 108 (90 BASE) MCG/ACT IN AERS: 2.0000 | INHALATION_SPRAY | Freq: Four times a day (QID) | RESPIRATORY_TRACT | Status: DC | PRN

## 2014-09-18 MED ORDER — ACETAMINOPHEN 325 MG PO TABS: 650.0000 mg | ORAL_TABLET | ORAL | Status: DC | PRN

## 2014-09-18 MED ORDER — INSULIN GLARGINE 100 UNIT/ML ~~LOC~~ SOLN
20.0000 [IU] | Freq: Every day | SUBCUTANEOUS | Status: DC
  Administered 2014-09-18 – 2014-09-19 (×2): 20 [IU] via SUBCUTANEOUS
  Filled 2014-09-18 (×2): qty 0.2

## 2014-09-18 MED ORDER — IBUPROFEN 200 MG PO TABS
600.0000 mg | ORAL_TABLET | Freq: Three times a day (TID) | ORAL | Status: DC | PRN
Start: 2014-09-18 — End: 2014-09-19

## 2014-09-18 MED ORDER — OXYCODONE HCL 5 MG PO TABS
2.5000 mg | ORAL_TABLET | Freq: Four times a day (QID) | ORAL | Status: DC | PRN
Start: 2014-09-18 — End: 2014-09-19
  Administered 2014-09-18 – 2014-09-19 (×3): 2.5 mg via ORAL
  Filled 2014-09-18 (×3): qty 1

## 2014-09-18 NOTE — ED Provider Notes (Signed)
CSN: [id number]     Arrival date & time 09/18/14  2000 History   First MD Initiated Contact with Patient 09/18/14 2128     Chief Complaint  Patient presents with  . Suicidal     (Consider location/radiation/quality/duration/timing/severity/associated sxs/prior Treatment) HPI Comments: The patient is a 38 year old male has a history of bipolar disorder and states that he is taking his medications. Over the last month he states that his wife left him, he lost his job last week and has been using cocaine and drinking over the last several days. He has also had increased racing thoughts and hallucinations and states that he feels as though he is having suicidal thoughts and homicidal thoughts constantly. He has been taking his medications as prescribed according to his report, he has been thinking "real hard" about ways to kill himself but has not attempted at this time. He states that he does live with his son and his daughter.the symptoms are severe, persistent, gradually worsening  The history is provided by the patient.    Past Medical History  Diagnosis Date  . Allergy   . Depression   . GERD (gastroesophageal reflux disease)   . Hyperlipidemia   . Chronic pain disorder     Sees [facility]  . Urinary incontinence     detrusor instability  . Hypogonadism   . Diabetes mellitus     Type II  . Chronic neck pain   . Bipolar depression    Past Surgical History  Procedure Laterality Date  . Appendectomy    . Hernia repair    . Nasal sinus surgery  2008   Family History  Problem Relation Age of Onset  . Diabetes Mother   . Hypertension Mother   . Cirrhosis Mother   . Depression Mother   . Bipolar disorder Mother   . Arthritis Father 49    osteoarthritis  . Bipolar disorder Father   . Diabetes Sister     borderline  . Heart disease Paternal Uncle [id number]    MI  . Heart disease Maternal Grandfather     late 60's--MI  . Cancer Paternal Grandmother [phone]    lung  .  Bipolar disorder Paternal Grandmother   . Heart disease Paternal Grandfather [id number]    MI  . Bipolar disorder Paternal Grandfather    History  Substance Use Topics  . Smoking status: Current Every Day Smoker -- 1.50 packs/day    Types: Cigarettes    Last Attempt to Quit: 08/29/2013  . Smokeless tobacco: Never Used  . Alcohol Use: Yes    Review of Systems  All other systems reviewed and are negative.     Allergies  Ciprofloxacin and Hydrocodone  Home Medications   Prior to Admission medications   Medication Sig Start Date End Date Taking? Authorizing Provider  albuterol (PROVENTIL HFA;VENTOLIN HFA) 108 (90 BASE) MCG/ACT inhaler Inhale 2 puffs into the lungs every 6 (six) hours as needed for wheezing or shortness of breath. 08/22/14  Yes Sandford CrazeMelissa O'Sullivan, NP  augmented betamethasone dipropionate (DIPROLENE-AF) 0.05 % cream Apply 1 application topically 2 (two) times daily as needed. rash 08/18/14  Yes Sandford CrazeMelissa O'Sullivan, NP  azelastine (ASTELIN) 0.1 % nasal spray Place 2 sprays into both nostrils 2 (two) times daily. Use in each nostril as directed 07/24/14  Yes Sandford CrazeMelissa O'Sullivan, NP  cetirizine (ZYRTEC) 10 MG tablet Take 1 tablet (10 mg total) by mouth daily. For allergies 03/23/14  Yes Sanjuana KavaAgnes I Nwoko, NP  DULoxetine (CYMBALTA) 60  MG capsule Take 1 capsule (60 mg total) by mouth daily. For depression 08/17/14  Yes Cleotis NipperSyed T Arfeen, MD  hydrOXYzine (ATARAX/VISTARIL) 50 MG tablet Take 50 mg by mouth daily as needed for anxiety.   Yes Historical Provider, MD  Insulin Glargine (LANTUS SOLOSTAR) 100 UNIT/ML Solostar Pen Inject 20 Units into the skin at bedtime.   Yes Historical Provider, MD  lamoTRIgine (LAMICTAL) 150 MG tablet Take 1 tablet (150 mg total) by mouth daily. 08/17/14  Yes Cleotis NipperSyed T Arfeen, MD  metFORMIN (GLUCOPHAGE) 1000 MG tablet Take 1,000 mg by mouth 2 (two) times daily with a meal.   Yes Historical Provider, MD  ondansetron (ZOFRAN) 4 MG tablet Take 1 tablet (4 mg total) by mouth  every 8 (eight) hours as needed for nausea or vomiting. 08/11/14  Yes Jeanine LuzGregory Calone, FNP  oxyCODONE-acetaminophen (PERCOCET) 7.5-325 MG per tablet Take 1 tablet by mouth every 6 (six) hours as needed. pain 07/31/14  Yes Historical Provider, MD  pioglitazone (ACTOS) 30 MG tablet Take 1 tablet (30 mg total) by mouth daily. 08/17/14  Yes Reather LittlerAjay Kumar, MD  simvastatin (ZOCOR) 40 MG tablet Take 1 tablet (40 mg total) by mouth at bedtime. For high cholesterol 07/12/14  Yes Reather LittlerAjay Kumar, MD  Testosterone (TESTOPEL) 75 MG PLLT by Implant route.   Yes Historical Provider, MD  traZODone (DESYREL) 100 MG tablet Take 1 tablet (100 mg total) by mouth at bedtime. For sleep 08/17/14  Yes Cleotis NipperSyed T Arfeen, MD  cephALEXin (KEFLEX) 500 MG capsule Take 1 capsule (500 mg total) by mouth 2 (two) times daily. 08/15/14   Sandford CrazeMelissa O'Sullivan, NP  omega-3 acid ethyl esters (LOVAZA) 1 G capsule Take 4 capsules (4 g total) by mouth daily. For high cholesterol/fats 03/23/14   Sanjuana KavaAgnes I Nwoko, NP   BP 125/74 mmHg  Pulse 106  Temp(Src) 98.2 F (36.8 C)  Resp 22  Ht 6\' 8"  (2.032 m)  Wt 170 lb (77.111 kg)  BMI 18.68 kg/m2  SpO2 96% Physical Exam  Constitutional: He appears well-developed and well-nourished. No distress.  HENT:  Head: Normocephalic and atraumatic.  Mouth/Throat: Oropharynx is clear and moist. No oropharyngeal exudate.  Eyes: Conjunctivae and EOM are normal. Pupils are equal, round, and reactive to light. Right eye exhibits no discharge. Left eye exhibits no discharge. No scleral icterus.  Neck: Normal range of motion. Neck supple. No JVD present. No thyromegaly present.  Cardiovascular: Normal rate, regular rhythm, normal heart sounds and intact distal pulses.  Exam reveals no gallop and no friction rub.   No murmur heard. Pulmonary/Chest: Effort normal and breath sounds normal. No respiratory distress. He has no wheezes. He has no rales.  Abdominal: Soft. Bowel sounds are normal. He exhibits no distension and no mass.  There is no tenderness.  Musculoskeletal: Normal range of motion. He exhibits no edema or tenderness.  Lymphadenopathy:    He has no cervical adenopathy.  Neurological: He is alert. Coordination normal.  Skin: Skin is warm and dry. No rash noted. No erythema.  Psychiatric:  Tearful, depressed, suicidal  Nursing note and vitals reviewed.   ED Course  Procedures (including critical care time) Labs Review Labs Reviewed  CBC - Abnormal; Notable for the following:    WBC 11.8 (*)    HCT 37.2 (*)    All other components within normal limits  COMPREHENSIVE METABOLIC PANEL - Abnormal; Notable for the following:    Sodium 134 (*)    Chloride 93 (*)    Glucose, Bld 141 (*)  BUN 3 (*)    Anion gap 17 (*)    All other components within normal limits  ETHANOL - Abnormal; Notable for the following:    Alcohol, Ethyl (B) 66 (*)    All other components within normal limits  SALICYLATE LEVEL - Abnormal; Notable for the following:    Salicylate Lvl <2.0 (*)    All other components within normal limits  URINE RAPID DRUG SCREEN (HOSP PERFORMED) - Abnormal; Notable for the following:    Cocaine POSITIVE (*)    All other components within normal limits  CBG MONITORING, ED - Abnormal; Notable for the following:    Glucose-Capillary 141 (*)    All other components within normal limits  ACETAMINOPHEN LEVEL    Imaging Review No results found.    MDM   Final diagnoses:  Suicidal thoughts  Bipolar affective disorder, most recent episode unspecified type, remission status unspecified    The patient is clearly decompensating and having active suicidal thoughts, this may be in part related to substance use, irregardless the patient will need evaluation and likely inpatient treatment for his suicidal thoughts and decompensated bipolar disorder. Labs show positive for cocaine, no other significant findings, at this time the patient is stable to move to the psychiatric ED.  Pt needs  admission.  Meds given in ED:    Vida Roller, MD 09/18/14 2351

## 2014-09-18 NOTE — Telephone Encounter (Signed)
Pt did not show for appointment 09/18/2014 at 4pm for refills, neck/shoulder pain

## 2014-09-18 NOTE — ED Notes (Signed)
Pt states he is suicidal and homicidal  Pt states he is hearing voices that tell him to kill himself repeatedly and he has visual hallucinations where he can see himself strangling someone   Pt states he is bipolar and has been taking his medications  Pt states he usually does not use alcohol but has been drinking lately and has been using cocaine  Pt states last used today around 6 or 7 pm  [name] pupils are dilated  Pt has moments of crying

## 2014-09-18 NOTE — BH Assessment (Signed)
[facility] Assessment Progress Note   This clinician attempted to awaken patient at 23:54 but patient would not arouse.  Will attempt later.

## 2014-09-18 NOTE — Telephone Encounter (Signed)
Please send no show letter

## 2014-09-18 NOTE — ED Notes (Signed)
Pt has in belonging bag:  [employer] wallet, [place] [employer] lic, [employer] [id number], social security card, a handful of coins, remote control key with one key, two chap stick, blue jeans, white shoes, green briefs, blue t-shirt, blue collar button up shirt, white socks, camo baseball cap, leather jacket, cigs box, eye drops

## 2014-09-18 NOTE — ED Notes (Signed)
Pt presents with complaint of SI, states numerous plans to harm himself and HI, plan to choke someone. Auditory hallucinations telling him to kill himself.  Feeling hopeless, pt admits to hx of Bipolar DO.  Pt is known Diabetic.  Pt cooperative but anxious.

## 2014-09-19 ENCOUNTER — Encounter (HOSPITAL_COMMUNITY): Payer: Self-pay | Admitting: *Deleted

## 2014-09-19 ENCOUNTER — Encounter: Payer: Self-pay | Admitting: Family

## 2014-09-19 ENCOUNTER — Encounter (HOSPITAL_COMMUNITY): Payer: Self-pay

## 2014-09-19 ENCOUNTER — Inpatient Hospital Stay (HOSPITAL_COMMUNITY)
Admission: AD | Admit: 2014-09-19 | Discharge: 2014-09-27 | DRG: 885 | Disposition: A | Discharge status: Home / Self Care | Payer: 59 | Source: Intra-hospital | Attending: Psychiatry | Admitting: Psychiatry

## 2014-09-19 DIAGNOSIS — F1024 Alcohol dependence with alcohol-induced mood disorder: Secondary | ICD-10-CM | POA: Insufficient documentation

## 2014-09-19 DIAGNOSIS — F1020 Alcohol dependence, uncomplicated: Secondary | ICD-10-CM | POA: Diagnosis present

## 2014-09-19 DIAGNOSIS — R45851 Suicidal ideations: Secondary | ICD-10-CM | POA: Diagnosis present

## 2014-09-19 DIAGNOSIS — F329 Major depressive disorder, single episode, unspecified: Secondary | ICD-10-CM | POA: Diagnosis present

## 2014-09-19 DIAGNOSIS — F1410 Cocaine abuse, uncomplicated: Secondary | ICD-10-CM | POA: Diagnosis present

## 2014-09-19 DIAGNOSIS — F17210 Nicotine dependence, cigarettes, uncomplicated: Secondary | ICD-10-CM | POA: Diagnosis present

## 2014-09-19 DIAGNOSIS — F1994 Other psychoactive substance use, unspecified with psychoactive substance-induced mood disorder: Secondary | ICD-10-CM | POA: Diagnosis present

## 2014-09-19 DIAGNOSIS — G4700 Insomnia, unspecified: Secondary | ICD-10-CM | POA: Diagnosis present

## 2014-09-19 DIAGNOSIS — G8929 Other chronic pain: Secondary | ICD-10-CM | POA: Diagnosis present

## 2014-09-19 DIAGNOSIS — F1420 Cocaine dependence, uncomplicated: Secondary | ICD-10-CM | POA: Diagnosis present

## 2014-09-19 DIAGNOSIS — F3130 Bipolar disorder, current episode depressed, mild or moderate severity, unspecified: Secondary | ICD-10-CM | POA: Insufficient documentation

## 2014-09-19 DIAGNOSIS — E1165 Type 2 diabetes mellitus with hyperglycemia: Secondary | ICD-10-CM | POA: Diagnosis present

## 2014-09-19 DIAGNOSIS — Z79899 Other long term (current) drug therapy: Secondary | ICD-10-CM | POA: Diagnosis not present

## 2014-09-19 DIAGNOSIS — M549 Dorsalgia, unspecified: Secondary | ICD-10-CM | POA: Diagnosis present

## 2014-09-19 DIAGNOSIS — F332 Major depressive disorder, recurrent severe without psychotic features: Secondary | ICD-10-CM

## 2014-09-19 DIAGNOSIS — F322 Major depressive disorder, single episode, severe without psychotic features: Secondary | ICD-10-CM | POA: Diagnosis present

## 2014-09-19 DIAGNOSIS — Z720 Tobacco use: Secondary | ICD-10-CM

## 2014-09-19 DIAGNOSIS — F319 Bipolar disorder, unspecified: Secondary | ICD-10-CM | POA: Diagnosis present

## 2014-09-19 DIAGNOSIS — R45850 Homicidal ideations: Secondary | ICD-10-CM | POA: Diagnosis present

## 2014-09-19 LAB — CBG MONITORING, ED
GLUCOSE-CAPILLARY: 101 mg/dL — AB (ref 70–99)
Glucose-Capillary: 112 mg/dL — ABNORMAL HIGH (ref 70–99)
Glucose-Capillary: 215 mg/dL — ABNORMAL HIGH (ref 70–99)
Glucose-Capillary: 243 mg/dL — ABNORMAL HIGH (ref 70–99)

## 2014-09-19 LAB — GLUCOSE, CAPILLARY: Glucose-Capillary: 231 mg/dL — ABNORMAL HIGH (ref 70–99)

## 2014-09-19 MED ORDER — SIMVASTATIN 40 MG PO TABS
40.0000 mg | ORAL_TABLET | Freq: Every day | ORAL | Status: DC
  Administered 2014-09-20 – 2014-09-26 (×7): 40 mg via ORAL
  Filled 2014-09-19: qty 1
  Filled 2014-09-19: qty 2
  Filled 2014-09-19 (×8): qty 1

## 2014-09-19 MED ORDER — LAMOTRIGINE 150 MG PO TABS: 150.0000 mg | ORAL_TABLET | Freq: Every day | ORAL | Status: DC

## 2014-09-19 MED ORDER — METFORMIN HCL 500 MG PO TABS
1000.0000 mg | ORAL_TABLET | Freq: Two times a day (BID) | ORAL | Status: DC
  Administered 2014-09-20 – 2014-09-27 (×15): 1000 mg via ORAL
  Filled 2014-09-19 (×19): qty 2

## 2014-09-19 MED ORDER — PIOGLITAZONE HCL 30 MG PO TABS: 30.0000 mg | ORAL_TABLET | Freq: Every day | ORAL | Status: DC

## 2014-09-19 MED ORDER — AZELASTINE HCL 0.1 % NA SOLN
2.0000 | Freq: Two times a day (BID) | NASAL | Status: DC
  Administered 2014-09-20 – 2014-09-27 (×9): 2 via NASAL
  Filled 2014-09-19 (×2): qty 30

## 2014-09-19 MED ORDER — HYDROXYZINE HCL 50 MG PO TABS
50.0000 mg | ORAL_TABLET | Freq: Every day | ORAL | Status: DC | PRN
  Administered 2014-09-20 – 2014-09-22 (×4): 50 mg via ORAL
  Filled 2014-09-19 (×4): qty 1

## 2014-09-19 MED ORDER — ADULT MULTIVITAMIN W/MINERALS CH
1.0000 | ORAL_TABLET | Freq: Every day | ORAL | Status: DC
  Administered 2014-09-20 – 2014-09-27 (×8): 1 via ORAL
  Filled 2014-09-19 (×10): qty 1

## 2014-09-19 MED ORDER — TRAZODONE HCL 100 MG PO TABS
100.0000 mg | ORAL_TABLET | Freq: Every day | ORAL | Status: DC
  Administered 2014-09-19 – 2014-09-26 (×8): 100 mg via ORAL
  Filled 2014-09-19 (×9): qty 1
  Filled 2014-09-19: qty 3
  Filled 2014-09-19: qty 1

## 2014-09-19 MED ORDER — ALBUTEROL SULFATE HFA 108 (90 BASE) MCG/ACT IN AERS: 2.0000 | INHALATION_SPRAY | Freq: Four times a day (QID) | RESPIRATORY_TRACT | Status: DC | PRN

## 2014-09-19 MED ORDER — HYDROXYZINE HCL 50 MG PO TABS: 50.0000 mg | ORAL_TABLET | Freq: Every day | ORAL | Status: DC | PRN

## 2014-09-19 MED ORDER — CHLORDIAZEPOXIDE HCL 25 MG PO CAPS
25.0000 mg | ORAL_CAPSULE | ORAL | Status: AC
  Administered 2014-09-22 (×2): 25 mg via ORAL
  Filled 2014-09-19 (×2): qty 1

## 2014-09-19 MED ORDER — TRAZODONE HCL 100 MG PO TABS: 100.0000 mg | ORAL_TABLET | Freq: Every day | ORAL | Status: DC

## 2014-09-19 MED ORDER — METFORMIN HCL 500 MG PO TABS
1000.0000 mg | ORAL_TABLET | Freq: Two times a day (BID) | ORAL | Status: DC
  Filled 2014-09-19: qty 2

## 2014-09-19 MED ORDER — NICOTINE 21 MG/24HR TD PT24
21.0000 mg | MEDICATED_PATCH | Freq: Every day | TRANSDERMAL | Status: DC
  Administered 2014-09-20 – 2014-09-27 (×8): 21 mg via TRANSDERMAL
  Filled 2014-09-19 (×10): qty 1

## 2014-09-19 MED ORDER — INSULIN ASPART 100 UNIT/ML ~~LOC~~ SOLN
0.0000 [IU] | Freq: Every day | SUBCUTANEOUS | Status: DC
  Administered 2014-09-19 – 2014-09-20 (×2): 2 [IU] via SUBCUTANEOUS
  Administered 2014-09-22: 4 [IU] via SUBCUTANEOUS
  Administered 2014-09-24: 2 [IU] via SUBCUTANEOUS
  Administered 2014-09-25: 3 [IU] via SUBCUTANEOUS
  Administered 2014-09-26: 2 [IU] via SUBCUTANEOUS

## 2014-09-19 MED ORDER — CHLORDIAZEPOXIDE HCL 25 MG PO CAPS
25.0000 mg | ORAL_CAPSULE | Freq: Four times a day (QID) | ORAL | Status: AC
  Administered 2014-09-19 – 2014-09-20 (×5): 25 mg via ORAL
  Filled 2014-09-19 (×5): qty 1

## 2014-09-19 MED ORDER — PIOGLITAZONE HCL 30 MG PO TABS
30.0000 mg | ORAL_TABLET | Freq: Every day | ORAL | Status: DC
  Administered 2014-09-20 – 2014-09-27 (×8): 30 mg via ORAL
  Filled 2014-09-19 (×10): qty 1

## 2014-09-19 MED ORDER — CHLORDIAZEPOXIDE HCL 25 MG PO CAPS
25.0000 mg | ORAL_CAPSULE | Freq: Four times a day (QID) | ORAL | Status: AC | PRN
  Administered 2014-09-20 – 2014-09-21 (×3): 25 mg via ORAL
  Filled 2014-09-19 (×3): qty 1

## 2014-09-19 MED ORDER — ALUM & MAG HYDROXIDE-SIMETH 200-200-20 MG/5ML PO SUSP: 30.0000 mL | ORAL | Status: DC | PRN

## 2014-09-19 MED ORDER — DULOXETINE HCL 60 MG PO CPEP
60.0000 mg | ORAL_CAPSULE | Freq: Every day | ORAL | Status: DC
  Administered 2014-09-20 – 2014-09-21 (×2): 60 mg via ORAL
  Filled 2014-09-19 (×3): qty 1

## 2014-09-19 MED ORDER — ACETAMINOPHEN 325 MG PO TABS
650.0000 mg | ORAL_TABLET | Freq: Four times a day (QID) | ORAL | Status: DC | PRN
  Administered 2014-09-20 – 2014-09-27 (×11): 650 mg via ORAL
  Filled 2014-09-19 (×11): qty 2

## 2014-09-19 MED ORDER — VITAMIN B-1 100 MG PO TABS
100.0000 mg | ORAL_TABLET | Freq: Every day | ORAL | Status: DC
  Administered 2014-09-20 – 2014-09-27 (×8): 100 mg via ORAL
  Filled 2014-09-19 (×10): qty 1

## 2014-09-19 MED ORDER — LORATADINE 10 MG PO TABS
10.0000 mg | ORAL_TABLET | Freq: Every day | ORAL | Status: DC
  Administered 2014-09-20 – 2014-09-27 (×8): 10 mg via ORAL
  Filled 2014-09-19 (×10): qty 1

## 2014-09-19 MED ORDER — INSULIN GLARGINE 100 UNIT/ML ~~LOC~~ SOLN
20.0000 [IU] | Freq: Every day | SUBCUTANEOUS | Status: DC
Start: 2014-09-20 — End: 2014-09-22
  Administered 2014-09-20 – 2014-09-21 (×2): 20 [IU] via SUBCUTANEOUS

## 2014-09-19 MED ORDER — CHLORDIAZEPOXIDE HCL 25 MG PO CAPS
25.0000 mg | ORAL_CAPSULE | Freq: Every day | ORAL | Status: AC
  Administered 2014-09-23: 25 mg via ORAL
  Filled 2014-09-19: qty 1

## 2014-09-19 MED ORDER — THIAMINE HCL 100 MG/ML IJ SOLN
100.0000 mg | Freq: Once | INTRAMUSCULAR | Status: AC
  Administered 2014-09-19: 100 mg via INTRAMUSCULAR
  Filled 2014-09-19: qty 2

## 2014-09-19 MED ORDER — CHLORDIAZEPOXIDE HCL 25 MG PO CAPS
25.0000 mg | ORAL_CAPSULE | Freq: Three times a day (TID) | ORAL | Status: AC
Start: 2014-09-21 — End: 2014-09-21
  Administered 2014-09-21 (×3): 25 mg via ORAL
  Filled 2014-09-19 (×3): qty 1

## 2014-09-19 MED ORDER — LOPERAMIDE HCL 2 MG PO CAPS: 2.0000 mg | ORAL_CAPSULE | ORAL | Status: AC | PRN

## 2014-09-19 MED ORDER — LORATADINE 10 MG PO TABS: 10.0000 mg | ORAL_TABLET | Freq: Every day | ORAL | Status: DC

## 2014-09-19 MED ORDER — MAGNESIUM HYDROXIDE 400 MG/5ML PO SUSP: 30.0000 mL | Freq: Every day | ORAL | Status: DC | PRN

## 2014-09-19 MED ORDER — INSULIN GLARGINE 100 UNIT/ML SOLOSTAR PEN: 20.0000 [IU] | PEN_INJECTOR | Freq: Every day | SUBCUTANEOUS | Status: DC

## 2014-09-19 MED ORDER — DULOXETINE HCL 60 MG PO CPEP
60.0000 mg | ORAL_CAPSULE | Freq: Every day | ORAL | Status: DC
  Filled 2014-09-19: qty 1

## 2014-09-19 MED ORDER — LAMOTRIGINE 150 MG PO TABS
150.0000 mg | ORAL_TABLET | Freq: Every day | ORAL | Status: DC
  Administered 2014-09-20 – 2014-09-22 (×3): 150 mg via ORAL
  Filled 2014-09-19 (×5): qty 1

## 2014-09-19 MED ORDER — SIMVASTATIN 40 MG PO TABS: 40.0000 mg | ORAL_TABLET | Freq: Every day | ORAL | Status: DC

## 2014-09-19 MED ORDER — ONDANSETRON 4 MG PO TBDP: 4.0000 mg | ORAL_TABLET | Freq: Four times a day (QID) | ORAL | Status: AC | PRN

## 2014-09-19 MED ORDER — INSULIN ASPART 100 UNIT/ML ~~LOC~~ SOLN
0.0000 [IU] | Freq: Three times a day (TID) | SUBCUTANEOUS | Status: DC
  Administered 2014-09-20 (×2): 8 [IU] via SUBCUTANEOUS
  Administered 2014-09-20: 3 [IU] via SUBCUTANEOUS
  Administered 2014-09-21: 15 [IU] via SUBCUTANEOUS
  Administered 2014-09-21 (×2): 8 [IU] via SUBCUTANEOUS
  Administered 2014-09-22: 5 [IU] via SUBCUTANEOUS
  Administered 2014-09-22: 11 [IU] via SUBCUTANEOUS
  Administered 2014-09-22: 5 [IU] via SUBCUTANEOUS
  Administered 2014-09-23: 11 [IU] via SUBCUTANEOUS
  Administered 2014-09-23 – 2014-09-24 (×2): 5 [IU] via SUBCUTANEOUS
  Administered 2014-09-24: 8 [IU] via SUBCUTANEOUS
  Administered 2014-09-24: 11 [IU] via SUBCUTANEOUS
  Administered 2014-09-25: 15 [IU] via SUBCUTANEOUS
  Administered 2014-09-25: 5 [IU] via SUBCUTANEOUS
  Administered 2014-09-25: 15 [IU] via SUBCUTANEOUS
  Administered 2014-09-26: 8 [IU] via SUBCUTANEOUS
  Administered 2014-09-26: 5 [IU] via SUBCUTANEOUS
  Administered 2014-09-26: 11 [IU] via SUBCUTANEOUS
  Administered 2014-09-27: 8 [IU] via SUBCUTANEOUS
  Administered 2014-09-27: 5 [IU] via SUBCUTANEOUS

## 2014-09-19 NOTE — ED Notes (Signed)
Pt is alert and oriented. Pt reports that he has voices that tell him to hurt himself and his ex wife. Pt is cooperative on the unit and takes medications as prescribed. Safety maintained on the [facility] unit.

## 2014-09-19 NOTE — Telephone Encounter (Signed)
Letter mailed

## 2014-09-19 NOTE — Consult Note (Signed)
[facility] Consult   Reason for Consult:  Depression Referring Physician:  EDP  [name] is an 38 y.o. male. Total Time spent with patient: 45 minutes  Assessment: AXIS I:  Major Depression, Recurrent severe, Substance Abuse and Substance Induced Mood Disorder AXIS II:  Deferred AXIS III:   Past Medical History  Diagnosis Date  . Allergy   . Depression   . GERD (gastroesophageal reflux disease)   . Hyperlipidemia   . Chronic pain disorder     Sees [facility]  . Urinary incontinence     detrusor instability  . Hypogonadism   . Diabetes mellitus     Type II  . Chronic neck pain   . Bipolar depression    AXIS IV:  other psychosocial or environmental problems and problems related to social environment AXIS V:  11-20 some danger of hurting self or others possible OR occasionally fails to maintain minimal personal hygiene OR gross impairment in communication  Plan:  Recommend psychiatric Inpatient admission when medically cleared.  Subjective:   [name] is a 38 y.o. male patient presents to [facility] ED with complaints of worsening depression and suicidal ideation.  HPI:  Patient states that he is having suicidal thoughts.  Stressor "I split up with my wife and lost my job last week.  Patient states that he sees Dr. Adele Schilder outpatient at [facility].  Patient is unable to contract for safety.  Patient denies homicidal ideation, psychosis, and paranoia.  Patient also has a history of prior suicide attempt via overdose and hospitalization 2 years ago.     HPI Elements:   Location:  Worsen depression. Quality:  suicidal thoughts. Severity:  suicidal thoughts. Timing:  1 week. Review of Systems  Psychiatric/Behavioral: Positive for depression and substance abuse. Negative for memory loss. Suicidal ideas: Passive. Hallucinations: Denies at this time. The patient is nervous/anxious. The patient does not have insomnia.   All other systems reviewed  and are negative.  Family History  Problem Relation Age of Onset  . Diabetes Mother   . Hypertension Mother   . Cirrhosis Mother   . Depression Mother   . Bipolar disorder Mother   . Arthritis Father 12    osteoarthritis  . Bipolar disorder Father   . Diabetes Sister     borderline  . Heart disease Paternal Uncle 78    MI  . Heart disease Maternal Grandfather     late 60's--MI  . Cancer Paternal Grandmother 22    lung  . Bipolar disorder Paternal Grandmother   . Heart disease Paternal Grandfather 35    MI  . Bipolar disorder Paternal Grandfather     Past Psychiatric History: Past Medical History  Diagnosis Date  . Allergy   . Depression   . GERD (gastroesophageal reflux disease)   . Hyperlipidemia   . Chronic pain disorder     Sees [facility]  . Urinary incontinence     detrusor instability  . Hypogonadism   . Diabetes mellitus     Type II  . Chronic neck pain   . Bipolar depression     reports that he has been smoking Cigarettes.  He has been smoking about 1.50 packs per day. He has never used smokeless tobacco. He reports that he drinks alcohol. He reports that he uses illicit drugs (Cocaine). Family History  Problem Relation Age of Onset  . Diabetes Mother   . Hypertension Mother   . Cirrhosis Mother   .  Depression Mother   . Bipolar disorder Mother   . Arthritis Father 38    osteoarthritis  . Bipolar disorder Father   . Diabetes Sister     borderline  . Heart disease Paternal Uncle 21    MI  . Heart disease Maternal Grandfather     late 60's--MI  . Cancer Paternal Grandmother 37    lung  . Bipolar disorder Paternal Grandmother   . Heart disease Paternal Grandfather 5    MI  . Bipolar disorder Paternal Grandfather    Family History Substance Abuse: No Family Supports: No (None reported ) Living Arrangements: Children (Children in the home ) Can pt return to current living arrangement?: Yes Abuse/Neglect [facility]) Physical Abuse:  Denies Verbal Abuse: Denies Sexual Abuse: Denies Allergies:   Allergies  Allergen Reactions  . Ciprofloxacin Hives  . Hydrocodone     Rash and nausea    ACT Assessment Complete:  Yes:    Educational Status    Risk to Self: Risk to self with the past 6 months Suicidal Ideation: Yes-Currently Present Suicidal Intent: Yes-Currently Present Is patient at risk for suicide?: Yes Suicidal Plan?: No Access to Means: Yes Specify Access to Suicidal Means: Pills, Sharps  What has been your use of drugs/alcohol within the last 12 months?: Using: alcohol,cocaine  Previous Attempts/Gestures: No How many times?: 0 Other Self Harm Risks: None  Triggers for Past Attempts: None known Intentional Self Injurious Behavior: None Family Suicide History: No Recent stressful life event(s): Divorce, Financial Problems, Other (Comment), Job Loss (Children in the home with pt ) Persecutory voices/beliefs?: No Depression: Yes Depression Symptoms: Insomnia, Tearfulness, Isolating, Loss of interest in usual pleasures, Feeling worthless/self pity Substance abuse history and/or treatment for substance abuse?: Yes Suicide prevention information given to non-admitted patients: Not applicable  Risk to Others: Risk to Others within the past 6 months Homicidal Ideation: Yes-Currently Present Thoughts of Harm to Others: Yes-Currently Present Comment - Thoughts of Harm to Others: He can himself strangling himself  Current Homicidal Intent: No Current Homicidal Plan: No Access to Homicidal Means: No Describe Access to Homicidal Means: None  Identified Victim: Pt does not specify anyone  History of harm to others?: No Assessment of Violence: None Noted Violent Behavior Description: None  Does patient have access to weapons?: No Criminal Charges Pending?: No Does patient have a court date: No  Abuse: Abuse/Neglect Assessment (Assessment to be complete while patient is alone) Physical Abuse: Denies Verbal  Abuse: Denies Sexual Abuse: Denies Exploitation of patient/patient's resources: Denies Self-Neglect: Denies  Prior Inpatient Therapy: Prior Inpatient Therapy Prior Inpatient Therapy: Yes Prior Therapy Dates: 2014,2015 Prior Therapy Facilty/Provider(s): [facility]  Reason for Treatment: Depression/SI   Prior Outpatient Therapy: Prior Outpatient Therapy Prior Outpatient Therapy: Yes Prior Therapy Dates: Current  Prior Therapy Facilty/Provider(s): Dr. Gavin Pound  Reason for Treatment: Med Mgt/Therapy   Additional Information: Additional Information 1:1 In Past 12 Months?: No CIRT Risk: No Elopement Risk: No Does patient have medical clearance?: Yes                  Objective: Blood pressure 110/53, pulse 97, temperature 98.2 F (36.8 C), temperature source Oral, resp. rate 16, height 6' 8" (2.032 m), weight 77.111 kg (170 lb), SpO2 100 %.Body mass index is 18.68 kg/(m^2). Results for orders placed or performed during the hospital encounter of 09/18/14 (from the past 72 hour(s))  Urine Drug Screen     Status: Abnormal   Collection Time: 09/18/14  9:00 PM  Result Value Ref Range   Opiates NONE DETECTED NONE DETECTED   Cocaine POSITIVE (A) NONE DETECTED   Benzodiazepines NONE DETECTED NONE DETECTED   Amphetamines NONE DETECTED NONE DETECTED   Tetrahydrocannabinol NONE DETECTED NONE DETECTED   Barbiturates NONE DETECTED NONE DETECTED    Comment:        DRUG SCREEN FOR MEDICAL PURPOSES ONLY.  IF CONFIRMATION IS NEEDED FOR ANY PURPOSE, NOTIFY LAB WITHIN 5 DAYS.        LOWEST DETECTABLE LIMITS FOR URINE DRUG SCREEN Drug Class       Cutoff (ng/mL) Amphetamine      1000 Barbiturate      200 Benzodiazepine   573 Tricyclics       220 Opiates          300 Cocaine          300 THC              50   Acetaminophen level     Status: None   Collection Time: 09/18/14  9:28 PM  Result Value Ref Range   Acetaminophen (Tylenol), Serum <15.0 10 - 30 ug/mL    Comment:         THERAPEUTIC CONCENTRATIONS VARY SIGNIFICANTLY. A RANGE OF 10-30 ug/mL MAY BE AN EFFECTIVE CONCENTRATION FOR MANY PATIENTS. HOWEVER, SOME ARE BEST TREATED AT CONCENTRATIONS OUTSIDE THIS RANGE. ACETAMINOPHEN CONCENTRATIONS >150 ug/mL AT 4 HOURS AFTER INGESTION AND >50 ug/mL AT 12 HOURS AFTER INGESTION ARE OFTEN ASSOCIATED WITH TOXIC REACTIONS.   CBC     Status: Abnormal   Collection Time: 09/18/14  9:28 PM  Result Value Ref Range   WBC 11.8 (H) 4.0 - 10.5 K/uL   RBC 4.33 4.22 - 5.81 MIL/uL   Hemoglobin 13.1 13.0 - 17.0 g/dL   HCT 37.2 (L) 39.0 - 52.0 %   MCV 85.9 78.0 - 100.0 fL   MCH 30.3 26.0 - 34.0 pg   MCHC 35.2 30.0 - 36.0 g/dL   RDW 13.5 11.5 - 15.5 %   Platelets 374 150 - 400 K/uL  Comprehensive metabolic panel     Status: Abnormal   Collection Time: 09/18/14  9:28 PM  Result Value Ref Range   Sodium 134 (L) 137 - 147 mEq/L   Potassium 4.1 3.7 - 5.3 mEq/L   Chloride 93 (L) 96 - 112 mEq/L   CO2 24 19 - 32 mEq/L   Glucose, Bld 141 (H) 70 - 99 mg/dL   BUN 3 (L) 6 - 23 mg/dL   Creatinine, Ser 0.57 0.50 - 1.35 mg/dL   Calcium 9.8 8.4 - 10.5 mg/dL   Total Protein 7.3 6.0 - 8.3 g/dL   Albumin 3.9 3.5 - 5.2 g/dL   AST 17 0 - 37 U/L   ALT 18 0 - 53 U/L   Alkaline Phosphatase 75 39 - 117 U/L   Total Bilirubin 0.3 0.3 - 1.2 mg/dL   GFR calc non Af Amer >90 >90 mL/min   GFR calc Af Amer >90 >90 mL/min    Comment: (NOTE) The eGFR has been calculated using the CKD EPI equation. This calculation has not been validated in all clinical situations. eGFR's persistently <90 mL/min signify possible Chronic Kidney Disease.    Anion gap 17 (H) 5 - 15  Ethanol (ETOH)     Status: Abnormal   Collection Time: 09/18/14  9:28 PM  Result Value Ref Range   Alcohol, Ethyl (B) 66 (H) 0 - 11 mg/dL    Comment:  LOWEST DETECTABLE LIMIT FOR SERUM ALCOHOL IS 11 mg/dL FOR MEDICAL PURPOSES ONLY   Salicylate level     Status: Abnormal   Collection Time: 09/18/14  9:28 PM  Result  Value Ref Range   Salicylate Lvl <6.3 (L) 2.8 - 20.0 mg/dL  POC CBG, ED     Status: Abnormal   Collection Time: 09/18/14  9:59 PM  Result Value Ref Range   Glucose-Capillary 141 (H) 70 - 99 mg/dL  CBG monitoring, ED     Status: Abnormal   Collection Time: 09/19/14  8:18 AM  Result Value Ref Range   Glucose-Capillary 112 (H) 70 - 99 mg/dL  CBG monitoring, ED     Status: Abnormal   Collection Time: 09/19/14 12:42 PM  Result Value Ref Range   Glucose-Capillary 215 (H) 70 - 99 mg/dL   Comment 1 Notify RN    Labs are reviewed see values above. Medications reviewed and no changes made  Current Facility-Administered Medications  Medication Dose Route Frequency Provider Last Rate Last Dose  . acetaminophen (TYLENOL) tablet 650 mg  650 mg Oral Q4H PRN Johnna Acosta, MD      . albuterol (PROVENTIL HFA;VENTOLIN HFA) 108 (90 BASE) MCG/ACT inhaler 2 puff  2 puff Inhalation Q6H PRN Johnna Acosta, MD      . DULoxetine (CYMBALTA) DR capsule 60 mg  60 mg Oral Daily Johnna Acosta, MD   60 mg at 09/19/14 1044  . hydrOXYzine (ATARAX/VISTARIL) tablet 50 mg  50 mg Oral Daily PRN Johnna Acosta, MD      . ibuprofen (ADVIL,MOTRIN) tablet 600 mg  600 mg Oral Q8H PRN Johnna Acosta, MD      . insulin glargine (LANTUS) injection 20 Units  20 Units Subcutaneous QHS Johnna Acosta, MD   20 Units at 09/18/14 2318  . lamoTRIgine (LAMICTAL) tablet 150 mg  150 mg Oral Daily Johnna Acosta, MD   150 mg at 09/19/14 1043  . loratadine (CLARITIN) tablet 10 mg  10 mg Oral Daily Johnna Acosta, MD   10 mg at 09/19/14 1044  . LORazepam (ATIVAN) tablet 1 mg  1 mg Oral Q8H PRN Johnna Acosta, MD   1 mg at 09/18/14 2229  . metFORMIN (GLUCOPHAGE) tablet 1,000 mg  1,000 mg Oral BID WC Johnna Acosta, MD   1,000 mg at 09/19/14 1042  . nicotine (NICODERM CQ - dosed in mg/24 hours) patch 21 mg  21 mg Transdermal Daily Johnna Acosta, MD   21 mg at 09/19/14 1100  . ondansetron (ZOFRAN) tablet 4 mg  4 mg Oral Q8H PRN Johnna Acosta, MD      . oxyCODONE-acetaminophen (PERCOCET/ROXICET) 5-325 MG per tablet 1 tablet  1 tablet Oral Q6H PRN Angela Adam, RPH   1 tablet at 09/19/14 1055   And  . oxyCODONE (Oxy IR/ROXICODONE) immediate release tablet 2.5 mg  2.5 mg Oral Q6H PRN Angela Adam, RPH   2.5 mg at 09/19/14 1054  . pioglitazone (ACTOS) tablet 30 mg  30 mg Oral Daily Johnna Acosta, MD   30 mg at 09/19/14 1044  . simvastatin (ZOCOR) tablet 40 mg  40 mg Oral QHS Johnna Acosta, MD   40 mg at 09/18/14 2229  . traZODone (DESYREL) tablet 100 mg  100 mg Oral QHS Johnna Acosta, MD   100 mg at 09/18/14 2229  . zolpidem (AMBIEN) tablet 5 mg  5 mg Oral QHS PRN Aaron Edelman  Amparo Bristol, MD   5 mg at 09/18/14 2245   Current Outpatient Prescriptions  Medication Sig Dispense Refill  . albuterol (PROVENTIL HFA;VENTOLIN HFA) 108 (90 BASE) MCG/ACT inhaler Inhale 2 puffs into the lungs every 6 (six) hours as needed for wheezing or shortness of breath. 1 Inhaler 0  . augmented betamethasone dipropionate (DIPROLENE-AF) 0.05 % cream Apply 1 application topically 2 (two) times daily as needed. rash 30 g 0  . azelastine (ASTELIN) 0.1 % nasal spray Place 2 sprays into both nostrils 2 (two) times daily. Use in each nostril as directed 30 mL 12  . cetirizine (ZYRTEC) 10 MG tablet Take 1 tablet (10 mg total) by mouth daily. For allergies    . DULoxetine (CYMBALTA) 60 MG capsule Take 1 capsule (60 mg total) by mouth daily. For depression 30 capsule 1  . hydrOXYzine (ATARAX/VISTARIL) 50 MG tablet Take 50 mg by mouth daily as needed for anxiety.    . Insulin Glargine (LANTUS SOLOSTAR) 100 UNIT/ML Solostar Pen Inject 20 Units into the skin at bedtime.    . lamoTRIgine (LAMICTAL) 150 MG tablet Take 1 tablet (150 mg total) by mouth daily. 30 tablet 2  . metFORMIN (GLUCOPHAGE) 1000 MG tablet Take 1,000 mg by mouth 2 (two) times daily with a meal.    . ondansetron (ZOFRAN) 4 MG tablet Take 1 tablet (4 mg total) by mouth every 8 (eight) hours as  needed for nausea or vomiting. 20 tablet 0  . oxyCODONE-acetaminophen (PERCOCET) 7.5-325 MG per tablet Take 1 tablet by mouth every 6 (six) hours as needed. pain    . pioglitazone (ACTOS) 30 MG tablet Take 1 tablet (30 mg total) by mouth daily. 30 tablet 3  . simvastatin (ZOCOR) 40 MG tablet Take 1 tablet (40 mg total) by mouth at bedtime. For high cholesterol 30 tablet 0  . Testosterone (TESTOPEL) 75 MG PLLT by Implant route.    . traZODone (DESYREL) 100 MG tablet Take 1 tablet (100 mg total) by mouth at bedtime. For sleep 30 tablet 1  . cephALEXin (KEFLEX) 500 MG capsule Take 1 capsule (500 mg total) by mouth 2 (two) times daily. 14 capsule 0  . omega-3 acid ethyl esters (LOVAZA) 1 G capsule Take 4 capsules (4 g total) by mouth daily. For high cholesterol/fats    . [DISCONTINUED] albuterol (PROVENTIL) 90 MCG/ACT inhaler Inhale 2 puffs into the lungs every 6 (six) hours as needed for wheezing. 17 g 12    Psychiatric Specialty Exam:     Blood pressure 110/53, pulse 97, temperature 98.2 F (36.8 C), temperature source Oral, resp. rate 16, height 6' 8" (2.032 m), weight 77.111 kg (170 lb), SpO2 100 %.Body mass index is 18.68 kg/(m^2).  General Appearance: Casual  Eye Contact::  Good  Speech:  Clear and Coherent and Normal Rate  Volume:  Normal  Mood:  Depressed  Affect:  Depressed and Flat  Thought Process:  Circumstantial and Goal Directed  Orientation:  Full (Time, Place, and Person)  Thought Content:  "suicidal thoughts"  Suicidal Thoughts:  Yes.  without intent/plan  Homicidal Thoughts:  No  Memory:  Immediate;   Good Recent;   Good Remote;   Good  Judgement:  Impaired  Insight:  Lacking  Psychomotor Activity:  Decreased  Concentration:  Fair  Recall:  Good  Fund of Knowledge:Good  Language: Good  Akathisia:  No  Handed:  Right  AIMS (if indicated):     Assets:  Communication Skills Desire for Improvement Housing  Sleep:  Musculoskeletal: Strength & Muscle Tone:  within normal limits Gait & Station: normal Patient leans: N/A  Treatment Plan Summary: Daily contact with patient to assess and evaluate symptoms and progress in treatment Medication management Inpatient treatment for stabilization recommended  Earleen Newport, FNP-BC 09/19/2014 2:41 PM  Patient seen, evaluated and I agree with notes by Nurse Practitioner. Corena Pilgrim, MD

## 2014-09-19 NOTE — Telephone Encounter (Signed)
No, please do not mail.

## 2014-09-19 NOTE — Telephone Encounter (Signed)
Before letter was mailed out, [facility].  Do you still want letter mailed?

## 2014-09-19 NOTE — BH Assessment (Signed)
Tele Assessment Note   Timothy HayJeffrey L Paolella is a 38 y.o. male who [facility] SI/depression/SA/HI.  Pt states   The following: he's been feeling SI/HI/AVH for 3-4 days, states triggered by stressors;(1) wife left 1 month ago; (2) lost job 1 week ago; (3) off meds x3days; (4) financial.  Pt continues SI/HI, no plan or intent to harm himself or others and hearing with command to harm himself.  Pt admits he has self- medicating with alcohol and cocaine, pt says he drinks 18pk, daily x1wk.  His last drink was 09/18/14, he drank 12pk and 18pk.  Pt uses 3-8 balls x3 days.  Pt is currently engaged in outpt service with Dr. Lolly MustacheArfeen and Raynelle FanningJulie Whitt([facility]).  Pt endorses depressive sxs: poor sleep(1-2 hrs, daily); poor appetite(47pd wt loss), crying spells and anxiety . Pt unable to contract for safety.    Axis I: Bipolar, Depressed and Alcohol Abuse; Cocaine Abuse Axis II: Deferred Axis III:  Past Medical History  Diagnosis Date  . Allergy   . Depression   . GERD (gastroesophageal reflux disease)   . Hyperlipidemia   . Chronic pain disorder     Sees [facility]  . Urinary incontinence     detrusor instability  . Hypogonadism   . Diabetes mellitus     Type II  . Chronic neck pain   . Bipolar depression    Axis IV: economic problems, occupational problems, other psychosocial or environmental problems, problems related to social environment and problems with primary support group Axis V: 31-40 impairment in reality testing  Past Medical History:  Past Medical History  Diagnosis Date  . Allergy   . Depression   . GERD (gastroesophageal reflux disease)   . Hyperlipidemia   . Chronic pain disorder     Sees [facility]  . Urinary incontinence     detrusor instability  . Hypogonadism   . Diabetes mellitus     Type II  . Chronic neck pain   . Bipolar depression     Past Surgical History  Procedure Laterality Date  . Appendectomy     . Hernia repair    . Nasal sinus surgery  2008    Family History:  Family History  Problem Relation Age of Onset  . Diabetes Mother   . Hypertension Mother   . Cirrhosis Mother   . Depression Mother   . Bipolar disorder Mother   . Arthritis Father 49    osteoarthritis  . Bipolar disorder Father   . Diabetes Sister     borderline  . Heart disease Paternal Uncle [phone]    MI  . Heart disease Maternal Grandfather     late 60's--MI  . Cancer Paternal Grandmother [phone]    lung  . Bipolar disorder Paternal Grandmother   . Heart disease Paternal Grandfather [phone]    MI  . Bipolar disorder Paternal Grandfather     Social History:  reports that he has been smoking Cigarettes.  He has been smoking about 1.50 packs per day. He has never used smokeless tobacco. He reports that he drinks alcohol. He reports that he uses illicit drugs (Cocaine).  Additional Social History:  Alcohol / Drug Use Pain Medications: See MAR  Prescriptions: See MAR  Over the Counter: See MAR  History of alcohol / drug use?: Yes Longest period of sobriety (when/how long): None  Withdrawal Symptoms: Other (Comment) (No w/d sxs ) Substance #1 Name of Substance 1: Alcohol  1 - Age of First Use: 18 YOM  1 - Amount (size/oz): 18 PK  1 - Frequency: Daily  1 - Duration: 1 Wk  1 - Last Use / Amount: 09/18/14 Substance #2 Name of Substance 2: Cocaine  2 - Age of First Use: 24 YOM  2 - Amount (size/oz): 3-8 Balls  2 - Frequency: Daily  2 - Duration: 3 Days  2 - Last Use / Amount: 09/18/14  CIWA: CIWA-Ar BP: 109/57 mmHg Pulse Rate: 86 COWS:    PATIENT STRENGTHS: (choose at least two) Capable of independent living  Allergies:  Allergies  Allergen Reactions  . Ciprofloxacin Hives  . Hydrocodone     Rash and nausea    Home Medications:  (Not in a hospital admission)  OB/GYN Status:  No LMP for male patient.  General Assessment Data [facility] of Assessment: [facility] Is this a Tele or Face-to-Face  Assessment?: Face-to-Face Is this an Initial Assessment or a Re-assessment for this encounter?: Initial Assessment Living Arrangements: Children (Children in the home ) Can pt return to current living arrangement?: Yes Admission Status: Voluntary Is patient capable of signing voluntary admission?: Yes Transfer from: Home Referral Source: Self/Family/Friend  Medical Screening Exam [facility] Walk-in ONLY) Medical Exam completed: No Reason for MSE not completed: Other: (None )  [facility] Crisis Care Plan Living Arrangements: Children (Children in the home ) Name of Psychiatrist: Dr. Lolly Mustache  [facility] of Therapist: [facility]   Education Status Is patient currently in school?: No Current Grade: None  Highest grade of school patient has completed: None  Name of school: None  Contact person: None   Risk to self with the past 6 months Suicidal Ideation: Yes-Currently Present Suicidal Intent: Yes-Currently Present Is patient at risk for suicide?: Yes Suicidal Plan?: No Access to Means: Yes Specify Access to Suicidal Means: Pills, Sharps  What has been your use of drugs/alcohol within the last 12 months?: Using: alcohol,cocaine  Previous Attempts/Gestures: No How many times?: 0 Other Self Harm Risks: None  Triggers for Past Attempts: None known Intentional Self Injurious Behavior: None Family Suicide History: No Recent stressful life event(s): Divorce, Financial Problems, Other (Comment), Job Loss (Children in the home with pt ) Persecutory voices/beliefs?: No Depression: Yes Depression Symptoms: Insomnia, Tearfulness, Isolating, Loss of interest in usual pleasures, Feeling worthless/self pity Substance abuse history and/or treatment for substance abuse?: Yes Suicide prevention information given to non-admitted patients: Not applicable  Risk to Others within the past 6 months Homicidal Ideation: Yes-Currently Present Thoughts of Harm to Others: Yes-Currently  Present Comment - Thoughts of Harm to Others: He can himself strangling himself  Current Homicidal Intent: No Current Homicidal Plan: No Access to Homicidal Means: No Describe Access to Homicidal Means: None  Identified Victim: Pt does not specify anyone  History of harm to others?: No Assessment of Violence: None Noted Violent Behavior Description: None  Does patient have access to weapons?: No Criminal Charges Pending?: No Does patient have a court date: No  Psychosis Hallucinations: Auditory, With command, Visual Delusions: None noted  Mental Status Report Appear/Hygiene: In scrubs Eye Contact: Fair Motor Activity: Unremarkable Speech: Logical/coherent, Soft Level of Consciousness: Alert Mood: Depressed, Sad Affect: Depressed, Sad, Flat Anxiety Level: None Thought Processes: Coherent, Relevant Judgement: Impaired Orientation: Person, Place, Time, Situation Obsessive Compulsive Thoughts/Behaviors: None  Cognitive Functioning Concentration: Normal Memory: Recent Intact, Remote Intact IQ: Average Insight: Poor Impulse Control: Fair Appetite: Poor Weight Loss: 47 Weight Gain: 0 Sleep: Decreased Total Hours of Sleep:  2 Vegetative Symptoms: None  ADLScreening [facility] Assessment Services) Patient's cognitive ability adequate to safely complete daily activities?: Yes Patient able to express need for assistance with ADLs?: Yes Independently performs ADLs?: Yes (appropriate for developmental age)  Prior Inpatient Therapy Prior Inpatient Therapy: Yes Prior Therapy Dates: 2014,2015 [facility]  Reason for Treatment: Depression/SI   Prior Outpatient Therapy Prior Outpatient Therapy: Yes Prior Therapy Dates: Current  Prior Therapy Facilty/Provider(s): Dr. Charlett BlakeArfeen/ Julie Whitt  Reason for Treatment: Med Mgt/Therapy   ADL Screening (condition at time of admission) Patient's cognitive ability adequate to safely complete daily activities?:  Yes Is the patient deaf or have difficulty hearing?: No Does the patient have difficulty seeing, even when wearing glasses/contacts?: No Does the patient have difficulty concentrating, remembering, or making decisions?: No Patient able to express need for assistance with ADLs?: Yes Does the patient have difficulty dressing or bathing?: No Independently performs ADLs?: Yes (appropriate for developmental age) Does the patient have difficulty walking or climbing stairs?: No Weakness of Legs: None Weakness of Arms/Hands: None  Home Assistive Devices/Equipment Home Assistive Devices/Equipment: None  Therapy Consults (therapy consults require a physician order) PT Evaluation Needed: No OT Evalulation Needed: No SLP Evaluation Needed: No Abuse/Neglect Assessment (Assessment to be complete while patient is alone) Physical Abuse: Denies Verbal Abuse: Denies Sexual Abuse: Denies Exploitation of patient/patient's resources: Denies Self-Neglect: Denies Values / Beliefs Cultural Requests During Hospitalization: None Spiritual Requests During Hospitalization: None Consults Spiritual Care Consult Needed: No Social Work Consult Needed: No Merchant navy officerAdvance Directives (For Healthcare) Does patient have an advance directive?: No Would patient like information on creating an advanced directive?: No - patient declined information Nutrition Screen- [facility] Patient's home diet: Regular  Additional Information 1:1 In Past 12 Months?: No CIRT Risk: No Elopement Risk: No Does patient have medical clearance?: Yes     Disposition:  Disposition Initial Assessment Completed for this Encounter: Yes Disposition of Patient: Referred to Donell Sievert(Spencer Simon, [place] recommend inpt admission) Patient referred to: Other (Comment) Donell Sievert([place], [place] recommends inpt admission )  Murrell ReddenSimmons, Jeffrie Lofstrom C 09/19/2014 6:54 AM

## 2014-09-19 NOTE — ED Notes (Signed)
Patient reports passive SI and HI toward his ex at present. Rates feelings of anxiety 5/10, depression 10/10. Reports decreased sleep and poor appetite in recent weeks. States his depression is on going.  Encouragement offered.   Q 15 safety checks continue on the unit.

## 2014-09-19 NOTE — Tx Team (Signed)
Initial Interdisciplinary Treatment Plan   PATIENT STRESSORS: Financial difficulties Loss of marriage Marital or family conflict Medication change or noncompliance Substance abuse   PATIENT STRENGTHS: Ability for insight Average or above average intelligence Motivation for treatment/growth Supportive family/friends   PROBLEM LIST: Problem List/Patient Goals Date to be addressed Date deferred Reason deferred Estimated date of resolution  depression 09/19/2014     anxiety 09/19/2014     Substance abuse 09/19/2014     Medication noncompliance 09/19/2014     SI/HI 09/19/2014     Risk for suicide 09/19/2014                        DISCHARGE CRITERIA:  Ability to meet basic life and health needs Improved stabilization in mood, thinking, and/or behavior Need for constant or close observation no longer present  PRELIMINARY DISCHARGE PLAN: Attend 12-step recovery group Participate in family therapy Return to previous living arrangement  PATIENT/FAMIILY INVOLVEMENT: This treatment plan has been presented to and reviewed with the patient, [name], and/or family member,   The patient and family have been given the opportunity to ask questions and make suggestions.  JEHU-APPIAH, Crystalyn Delia K 09/19/2014, 11:44 PM

## 2014-09-19 NOTE — Progress Notes (Signed)
Patient ID: Sherlyn HayJeffrey L Petraglia, male   DOB: [birth year], 38 y.o.   MRN: [id number] Admission note: D:Patient is a voluntary admission in no acute distress for increase depression and anxiety. Pt reports having SI to hurt himself and HI towards his wife. Pt reports AH tell him to hurt himself. Pt verbally contract for safety and stated he does not want to hurt himself or wife has is why he came to the hospital. Pt reports separated from wife for about six weeks and also loss of his job three weeks ago. Pt reports he had stop taking his medications and self medicating with alcohol and cocaine. Pt reports he started drinking about 12 pack to a case of beer daily. Pt reports using a "bunch" of cocaine daily.   A: Pt admitted to unit per protocol, skin assessment and belonging search done. No skin issues noted. Consent signed by pt. Pt educated on therapeutic milieu rules. Pt was introduced to milieu by nursing staff. Fall risk safety plan explained to the patient. 15 minutes checks started for safety.  R: Pt was receptive to education. Writer offered support.

## 2014-09-20 DIAGNOSIS — F1420 Cocaine dependence, uncomplicated: Secondary | ICD-10-CM

## 2014-09-20 DIAGNOSIS — R45851 Suicidal ideations: Secondary | ICD-10-CM

## 2014-09-20 DIAGNOSIS — F1024 Alcohol dependence with alcohol-induced mood disorder: Secondary | ICD-10-CM

## 2014-09-20 LAB — GLUCOSE, CAPILLARY
GLUCOSE-CAPILLARY: 152 mg/dL — AB (ref 70–99)
GLUCOSE-CAPILLARY: 285 mg/dL — AB (ref 70–99)
Glucose-Capillary: 267 mg/dL — ABNORMAL HIGH (ref 70–99)
Glucose-Capillary: 216 mg/dL — ABNORMAL HIGH (ref 70–99)

## 2014-09-20 MED ORDER — IBUPROFEN 600 MG PO TABS
600.0000 mg | ORAL_TABLET | Freq: Four times a day (QID) | ORAL | Status: DC | PRN
  Administered 2014-09-20 – 2014-09-22 (×5): 600 mg via ORAL
  Filled 2014-09-20 (×5): qty 1

## 2014-09-20 MED ORDER — ARIPIPRAZOLE 2 MG PO TABS
2.0000 mg | ORAL_TABLET | Freq: Every day | ORAL | Status: DC
  Administered 2014-09-20 – 2014-09-22 (×3): 2 mg via ORAL
  Filled 2014-09-20 (×6): qty 1

## 2014-09-20 NOTE — Plan of Care (Signed)
Problem: Consults Goal: Depression Patient Education See Patient Education Module for education specifics.  Outcome: Completed/Met Date Met:  09/20/14 Patient working on depression today.
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
## 2014-09-20 NOTE — Progress Notes (Signed)
D:  Patient's self inventory sheet, patient has poor sleep, sleep medication was not helpful.  Fair appetite, low energy level, poor concentration.  Rated depression and hopeless 10, anxiety 8.  Has experienced tremors, cravings, agitation, nausea, irritability in past 24 hours.  SI sometimes, contracts for safety.  Physical problems, pain, headaches.  Neck and shoulder pain.  Pain medication not working.  Stated he needs his home pain medications.  Oxycodone every 6 hrs as needed. A:  Medications administered per MD orders.  Emotional support and encouragement given patient. R:  SI and HI, contracts for safety.  Would not discuss SI plan.  Would not discuss who he had HI thoughts toward, denied HI toward staff.  Safety maintained with 15 minute checks.  Paint has asked various times throughout day for stronger pain medication, oxycodone.  MD informed.

## 2014-09-20 NOTE — [facility] Group Notes (Signed)
   [facility] Note  09/20/2014  8:45 AM   Participation Quality: Alert, Appropriate and Oriented  Mood/Affect: Depressed and Flat  Depression Rating: 10  Anxiety Rating: 10  Thoughts of Suicide: Pt denies SI/HI  Will you contract for safety? Yes  Current AVH: Pt denies  Plan for Discharge/Comments: Pt attended discharge planning group and actively participated in group. CSW provided pt with today's workbook. Patient reports that he is not feeling good today. He presents with flat and depressed affect. Patient reports high levels of anxiety and depression, states that his is currently hospitalized due to SI and HI although he denies experiencing any at this time. He plans to return home to follow up with his current providers Dr. Lolly MustacheArfeen and therapist Berniece AndreasJulie Whitt [facility] at discharge.  Transportation Means: Pt reports access to transportation  Supports: Patient identified his father as a support.  Samuella BruinKristin Alazae Crymes, MSW, [employer]CSWA Clinical Social Worker [facility] [phone]

## 2014-09-20 NOTE — [facility] Group Notes (Signed)
[facility] LCSW Group Therapy 09/20/2014  1:15 PM Type of Therapy: Group Therapy Participation Level: Active  Participation Quality: Attentive, Sharing and Supportive  Affect: Depressed and Flat  Cognitive: Alert and Oriented  Insight: Developing/Improving and Engaged  Engagement in Therapy: Developing/Improving and Engaged  Modes of Intervention: Clarification, Confrontation, Discussion, Education, Exploration, Limit-setting, Orientation, Problem-solving, Rapport Building, Dance movement psychotherapisteality Testing, Socialization and Support  Summary of Progress/Problems: The topic for group today was emotional regulation. This group focused on both positive and negative emotion identification and allowed group members to process ways to identify feelings, regulate negative emotions, and find healthy ways to manage internal/external emotions. Group members were asked to reflect on a time when their reaction to an emotion led to a negative outcome and explored how alternative responses using emotion regulation would have benefited them. Group members were also asked to discuss a time when emotion regulation was utilized when a negative emotion was experienced. Patient discussed his difficulty dealing with the loss of his marriage and separation from his wife. CSW's helped patient to process his grief and feelings of hopelessness. CSW's and other group members provided patient with emotional support and encouragement.  Samuella BruinKristin Ronica Vivian, MSW, [employer]LCSWA Clinical Social Worker [facility] [phone]

## 2014-09-20 NOTE — [facility] Suicide Risk Assessment (Signed)
   Nursing information obtained from:  Patient, Review of record Demographic factors:  Male, Caucasian, Unemployed Current Mental Status:  Self-harm thoughts Loss Factors:  Loss of significant relationship, Financial problems / change in socioeconomic status Historical Factors:  Family history of mental illness or substance abuse Risk Reduction Factors:  Responsible for children under 318 years of age, Sense of responsibility to family, Positive social support Total Time spent with patient: 45 minutes  CLINICAL FACTORS:  Depression, recent relapse on ETOH and Cocaine  Psychiatric Specialty Exam: Physical Exam  ROS  Blood pressure 116/63, pulse 74, temperature 98.5 F (36.9 C), temperature source Oral, resp. rate 18, height 5\' 11"  (1.803 m), weight 79.833 kg (176 lb).Body mass index is 24.56 kg/(m^2).  SEE ADMIT NOTE MSE   COGNITIVE FEATURES THAT CONTRIBUTE TO RISK:  Closed-mindedness    SUICIDE RISK:   Moderate:  Frequent suicidal ideation with limited intensity, and duration, some specificity in terms of plans, no associated intent, good self-control, limited dysphoria/symptomatology, some risk factors present, and identifiable protective factors, including available and accessible social support.  PLAN OF CARE: Patient will be admitted to inpatient psychiatric unit for stabilization and safety. Will provide and encourage milieu participation. Provide medication management and maked adjustments as needed.  Will also provide medication management to minimize risk of ETOH WDL. Will follow daily.    I certify that inpatient services furnished can reasonably be expected to improve the patient's condition.  [name] 09/20/2014, 1:28 PM

## 2014-09-20 NOTE — H&P (Addendum)
Psychiatric Admission Assessment Adult  Patient Identification:  [name] Date of Evaluation:  09/20/2014 Chief Complaint:  "I have been suicidal and homicidal " History of Present Illness:: Patient is a 38 year old man. He states that about two months ago he found his wife was being unfaithful and they have been separated since then. More recently he lost his job, and  States he also found out that his wife has not been paying home bills, so that now his financial situation is precarious. He states he relapsed on alcohol and cocaine over the last few weeks. He reports that prior to this he had been sober for almost thirteen years, except for brief relapse a few months ago. He reports depression, anxiety, and states he developed suicidal ideations, with some thoughts of shooting self, and also some homicidal ideations towards wife and man she was unfaithful with ( whom he states he does not know or know name of) . Today, however, he denies any ongoing homicidal ideations, and denies any SI at present. He does remain depressed.  Elements:  Worsening depression , with SI, in the context of psychosocial stressors and recent drug/alcohol relapse. Associated Signs/Synptoms: Depression Symptoms:  depressed mood, anhedonia, insomnia, fatigue, difficulty concentrating, suicidal thoughts with specific plan, weight loss, decreased appetite, (Hypo) Manic Symptoms: Denies  Anxiety Symptoms:  Reports history of panic attacks and agoraphobia but improving over time Psychotic Symptoms: States he has been hearing " my own voice" telling him to kill self over the last week or so. Does not present internally preoccupied or psychotic at this time. PTSD Symptoms: Does not endorse  Total Time spent with patient: 45 minutes  Psychiatric Specialty Exam: Physical Exam  Review of Systems  Constitutional: Negative for fever and chills.  Respiratory: Negative for cough and shortness of breath.    Cardiovascular: Negative for chest pain.  Gastrointestinal: Negative for nausea and vomiting.  Genitourinary: Negative.   Skin: Negative for rash.  Neurological: Positive for headaches. Negative for seizures.  Psychiatric/Behavioral: Positive for depression, suicidal ideas and substance abuse.    Blood pressure 116/63, pulse 74, temperature 98.5 F (36.9 C), temperature source Oral, resp. rate 18, height 5\' 11"  (1.803 m), weight 79.833 kg (176 lb).Body mass index is 24.56 kg/(m^2).  General Appearance: Fairly Groomed  Patent attorney::  Good  Speech:  Normal Rate  Volume:  Decreased  Mood:  Depressed  Affect:  Constricted  Thought Process:  Goal Directed and Linear  Orientation:  Full (Time, Place, and Person)  Thought Content:  describes auditory hallucinations as above- denies any today. No delusions expressed. Does not appear internally preoccupied or paranoid  Suicidal Thoughts:  Yes.  without intent/plan- at this time denies any suicidal or homicidal ideations, and contracts for safety on the unit. Denies any current thoughts of hurting wife or anyone else.   Homicidal Thoughts:  No ( reports recent HI towards wife, but not at present)   Memory:  recent and remote grossly intact  Judgement:  Fair  Insight:  Fair  Psychomotor Activity:  Decreased  Concentration:  Good  Recall:  Good  Fund of Knowledge:Good  Language: Good  Akathisia:  Negative  Handed:  Right  AIMS (if indicated):     Assets:  Communication Skills Resilience  Sleep:  Number of Hours: 6    Musculoskeletal: Strength & Muscle Tone: within normal limits Gait & Station: normal Patient leans: N/A  Past Psychiatric History: Diagnosis: Patient states he has been diagnosed with Bipolar Disorder.  Also describes history of Panic Attacks and Agoraphobia, which has improved overtime.  Hospitalizations:  Outpatient Care: Follows up at [facility]- sees Dr. Lolly Mustache for outpatient psychiatric management  Substance Abuse Care:  None current  Self-Mutilation: Denies   Suicidal Attempts: Denies   Violent Behaviors: States he has a history of explosive anger in the past, but has gotten better over the years.   Past Medical History:  As below- reports also chronic back pain. States was going to a pain clinic,  Prescribed oxydocone ,but apparently not recently taking  and UDS negative for opiates.  Past Medical History  Diagnosis Date  . Allergy   . Depression   . GERD (gastroesophageal reflux disease)   . Hyperlipidemia   . Chronic pain disorder     Sees [facility]  . Urinary incontinence     detrusor instability  . Hypogonadism   . Diabetes mellitus     Type II  . Chronic neck pain   . Bipolar depression    Denies LOC or Seizure Disorder history Allergies:   Allergies  Allergen Reactions  . Ciprofloxacin Hives  . Hydrocodone     Rash and nausea   PTA Medications: Prescriptions prior to admission  Medication Sig Dispense Refill Last Dose  . albuterol (PROVENTIL HFA;VENTOLIN HFA) 108 (90 BASE) MCG/ACT inhaler Inhale 2 puffs into the lungs every 6 (six) hours as needed for wheezing or shortness of breath. 1 Inhaler 0 Past Week at Unknown time  . azelastine (ASTELIN) 0.1 % nasal spray Place 2 sprays into both nostrils 2 (two) times daily. Use in each nostril as directed 30 mL 12 Past Week at Unknown time  . cetirizine (ZYRTEC) 10 MG tablet Take 1 tablet (10 mg total) by mouth daily. For allergies   09/19/2014 at Unknown time  . DULoxetine (CYMBALTA) 60 MG capsule Take 1 capsule (60 mg total) by mouth daily. For depression 30 capsule 1 09/19/2014 at Unknown time  . hydrOXYzine (ATARAX/VISTARIL) 50 MG tablet Take 50 mg by mouth daily as needed for anxiety.   09/19/2014 at Unknown time  . Insulin Glargine (LANTUS SOLOSTAR) 100 UNIT/ML Solostar Pen Inject 20 Units into the skin at bedtime.   09/18/2014 at Unknown time  . lamoTRIgine (LAMICTAL) 150 MG tablet Take 1 tablet (150 mg total) by mouth  daily. 30 tablet 2 09/19/2014 at Unknown time  . metFORMIN (GLUCOPHAGE) 1000 MG tablet Take 1,000 mg by mouth 2 (two) times daily with a meal.   09/19/2014 at Unknown time  . pioglitazone (ACTOS) 30 MG tablet Take 1 tablet (30 mg total) by mouth daily. 30 tablet 3 09/19/2014 at Unknown time  . simvastatin (ZOCOR) 40 MG tablet Take 1 tablet (40 mg total) by mouth at bedtime. For high cholesterol 30 tablet 0 09/19/2014 at Unknown time  . traZODone (DESYREL) 100 MG tablet Take 1 tablet (100 mg total) by mouth at bedtime. For sleep 30 tablet 1 09/18/2014 at Unknown time  . augmented betamethasone dipropionate (DIPROLENE-AF) 0.05 % cream Apply 1 application topically 2 (two) times daily as needed. rash 30 g 0 Unknown at Unknown time  . cephALEXin (KEFLEX) 500 MG capsule Take 1 capsule (500 mg total) by mouth 2 (two) times daily. 14 capsule 0 Unknown at Unknown time  . omega-3 acid ethyl esters (LOVAZA) 1 G capsule Take 4 capsules (4 g total) by mouth daily. For high cholesterol/fats   Unknown at Unknown time  . ondansetron (ZOFRAN) 4 MG tablet Take 1 tablet (4 mg  total) by mouth every 8 (eight) hours as needed for nausea or vomiting. 20 tablet 0 Unknown at Unknown time  . Testosterone (TESTOPEL) 75 MG PLLT by Implant route.   More than a month at Unknown time    Previous Psychotropic Medications:  Medication/Dose  Patient remembers Risperidone as not helpful  Over the last year or so, has been on Lamictal and Cymbalta, and feels these medications have helped. He has also been on  Trazodone for insomnia             Substance Abuse History in the last 12 months:  Yes.   Recent relapse on cocaine and alcohol- states he was drinking up to 40 beers per day. Recent cocaine use. States that other than a short lived relapse several months ago, he had been sober for close to 13 years up to recently.   Consequences of Substance Abuse: History of DUIs x 3 in the past- denies history of seizures or  DTs  Social History:  reports that he has been smoking Cigarettes.  He has been smoking about 2.00 packs per day. He has never used smokeless tobacco. He reports that he drinks alcohol. He reports that he uses illicit drugs (Cocaine). Additional Social History:  Current Place of Residence:  Lives at home  Place of Birth:   Family Members: Marital Status:  Married- separated at present Children: one 440 year old and one 40 year old, currently with mother  Sons:  Daughters: Relationships: recent marital separation relating to infidelity on her part, as per his report. Another stressor is that wife has GI cancer. Education:  HS Graduate Educational Problems/Performance: Religious Beliefs/Practices: History of Abuse (Emotional/Phsycial/Sexual) Occupational Experiences; He is on disability Military History:  None. Legal History: Denies  Hobbies/Interests:  Family History:  Parents alive, divorced, has one sister, no contact with her, father has history of alcohol dependence and patient states he is bipolar. No suicides in family. Family History  Problem Relation Age of Onset  . Diabetes Mother   . Hypertension Mother   . Cirrhosis Mother   . Depression Mother   . Bipolar disorder Mother   . Arthritis Father 49    osteoarthritis  . Bipolar disorder Father   . Diabetes Sister     borderline  . Heart disease Paternal Uncle [id number]    MI  . Heart disease Maternal Grandfather     late 60's--MI  . Cancer Paternal Grandmother [phone]    lung  . Bipolar disorder Paternal Grandmother   . Heart disease Paternal Grandfather [phone]    MI  . Bipolar disorder Paternal Grandfather     Results for orders placed or performed during the hospital encounter of 09/19/14 (from the past 72 hour(s))  Glucose, capillary     Status: Abnormal   Collection Time: 09/19/14 11:25 PM  Result Value Ref Range   Glucose-Capillary 231 (H) 70 - 99 mg/dL  Glucose, capillary     Status: Abnormal   Collection Time:  09/20/14  6:22 AM  Result Value Ref Range   Glucose-Capillary 152 (H) 70 - 99 mg/dL  Glucose, capillary     Status: Abnormal   Collection Time: 09/20/14 11:44 AM  Result Value Ref Range   Glucose-Capillary 285 (H) 70 - 99 mg/dL   Comment 1 Notify RN    Psychological Evaluations:  Assessment:   Patient is a 38 year old man, with a previous history of Bipolar Disorder. He also has a history of Alcohol and Cocaine Abuse.  He recently has been feeling more depressed in the context of serious psychosocial stressors, to include wife's infidelity, recent separation, loss of job, disability. He relapsed on cocaine and alcohol a few weeks ago and has been drinking heavily since then. At this time he is not presenting with any severe /significant withdrawal, and is not grossly tremulous, agitated, or diaphoretic. Vitals are stable. He developed suicidal ideations , and vague homicidal ideations towards wife, and the man she was having affair with, but at this time denies any ongoing homicidal or violent ideations. He is able to contract for safety on the unit. He states he had been on Lamictal and Cymbalta and that overall these medications were helping him.   AXIS I:  Bipolar Disorder Depressed versus Substance Induced Mood Disorder Depressed. Alcohol Dependence, Cocaine Dependence AXIS II:  Deferred AXIS III:   Past Medical History  Diagnosis Date  . Allergy   . Depression   . GERD (gastroesophageal reflux disease)   . Hyperlipidemia   . Chronic pain disorder     Sees [facility]  . Urinary incontinence     detrusor instability  . Hypogonadism   . Diabetes mellitus     Type II  . Chronic neck pain   . Bipolar depression    AXIS IV:  economic problems, occupational problems and problems related to social environment AXIS V:  41-50 serious symptoms  Treatment Plan/Recommendations:  See below   Treatment Plan Summary: Daily contact with patient to assess and evaluate  symptoms and progress in treatment Medication management See below Current Medications:  Current Facility-Administered Medications  Medication Dose Route Frequency Provider Last Rate Last Dose  . acetaminophen (TYLENOL) tablet 650 mg  650 mg Oral Q6H PRN Kerry HoughSpencer E Simon, PA-C   650 mg at 09/20/14 0757  . albuterol (PROVENTIL HFA;VENTOLIN HFA) 108 (90 BASE) MCG/ACT inhaler 2 puff  2 puff Inhalation Q6H PRN Kerry HoughSpencer E Simon, PA-C      . alum & mag hydroxide-simeth (MAALOX/MYLANTA) 200-200-20 MG/5ML suspension 30 mL  30 mL Oral Q4H PRN Kerry HoughSpencer E Simon, PA-C      . azelastine (ASTELIN) 0.1 % nasal spray 2 spray  2 spray Each Nare BID Kerry HoughSpencer E Simon, PA-C   2 spray at 09/20/14 [phone]  . chlordiazePOXIDE (LIBRIUM) capsule 25 mg  25 mg Oral Q6H PRN Kerry HoughSpencer E Simon, PA-C   25 mg at 09/20/14 [id number]  . chlordiazePOXIDE (LIBRIUM) capsule 25 mg  25 mg Oral QID Kerry HoughSpencer E Simon, PA-C   25 mg at 09/20/14 1203   Followed by  . [START ON 09/21/2014] chlordiazePOXIDE (LIBRIUM) capsule 25 mg  25 mg Oral TID Kerry HoughSpencer E Simon, PA-C       Followed by  . [START ON 09/22/2014] chlordiazePOXIDE (LIBRIUM) capsule 25 mg  25 mg Oral BH-qamhs Spencer E Simon, PA-C       Followed by  . [START ON 09/23/2014] chlordiazePOXIDE (LIBRIUM) capsule 25 mg  25 mg Oral Daily Kerry HoughSpencer E Simon, PA-C      . DULoxetine (CYMBALTA) DR capsule 60 mg  60 mg Oral Daily Kerry HoughSpencer E Simon, PA-C   60 mg at 09/20/14 0748  . hydrOXYzine (ATARAX/VISTARIL) tablet 50 mg  50 mg Oral Daily PRN Kerry HoughSpencer E Simon, PA-C   50 mg at 09/20/14 0755  . insulin aspart (novoLOG) injection 0-15 Units  0-15 Units Subcutaneous TID WC Kerry HoughSpencer E Simon, PA-C   8 Units at 09/20/14 1204  . insulin aspart (novoLOG) injection 0-5 Units  0-5 Units Subcutaneous QHS Kerry Hough, PA-C   2 Units at 09/19/14 2338  . insulin glargine (LANTUS) injection 20 Units  20 Units Subcutaneous QHS Shuvon Rankin, NP      . lamoTRIgine (LAMICTAL) tablet 150 mg  150 mg Oral Daily Shuvon Rankin,  NP   150 mg at 09/20/14 0749  . loperamide (IMODIUM) capsule 2-4 mg  2-4 mg Oral PRN Kerry Hough, PA-C      . loratadine (CLARITIN) tablet 10 mg  10 mg Oral Daily Shuvon Rankin, NP   10 mg at 09/20/14 0750  . magnesium hydroxide (MILK OF MAGNESIA) suspension 30 mL  30 mL Oral Daily PRN Shuvon Rankin, NP      . metFORMIN (GLUCOPHAGE) tablet 1,000 mg  1,000 mg Oral BID WC Kerry Hough, PA-C   1,000 mg at 09/20/14 0750  . multivitamin with minerals tablet 1 tablet  1 tablet Oral Daily Kerry Hough, PA-C   1 tablet at 09/20/14 0751  . nicotine (NICODERM CQ - dosed in mg/24 hours) patch 21 mg  21 mg Transdermal Daily Shuvon Rankin, NP   21 mg at 09/20/14 0751  . ondansetron (ZOFRAN-ODT) disintegrating tablet 4 mg  4 mg Oral Q6H PRN Kerry Hough, PA-C      . pioglitazone (ACTOS) tablet 30 mg  30 mg Oral Daily Shuvon Rankin, NP   30 mg at 09/20/14 0752  . simvastatin (ZOCOR) tablet 40 mg  40 mg Oral QHS Shuvon Rankin, NP      . thiamine (VITAMIN B-1) tablet 100 mg  100 mg Oral Daily Kerry Hough, PA-C   100 mg at 09/20/14 8119  . traZODone (DESYREL) tablet 100 mg  100 mg Oral QHS Shuvon Rankin, NP   100 mg at 09/19/14 2341    Observation Level/Precautions:  15 minute checks  Laboratory:  Hgb A1 C, TSH  Psychotherapy:  Supportive, milieu  Medications:  Librium detox protocol, Continue Lamictal and Cymbalta, Add Abilify 2 mgrs QDAY initially/  as discussed with patient, Ibuprofen PRN for pain, which he takes at home without side effects  Consultations:  As needed   Discharge Concerns:  Severe psychosocial stressors as above  Estimated LOS: 6 days   Other:     I certify that inpatient services furnished can reasonably be expected to improve the patient's condition.   COBOS, FERNANDO 11/11/201512:59 PM

## 2014-09-20 NOTE — Clinical Social Work Note (Signed)
CSW attempted to contact patient's father Jeff Finck [phone], left voicemail. Awaiting return call.  Graylin Sperling, MSW, LCSWA Clinical Social Worker [facility] [phone] 

## 2014-09-20 NOTE — Progress Notes (Signed)
Adult Psychoeducational Group Note  Date:  09/20/2014 Time:  11:24 PM  Group Topic/Focus:  Narcotics Anonymous  Participation Level:  Active  Participation Quality:  Appropriate  Affect:  Appropriate  Cognitive:  Appropriate  Insight: Appropriate  Engagement in Group:  Engaged  Modes of Intervention:  Education  Additional Comments:  Pt attended and participated in group.  Berlin Hunuttle, Raye Slyter M 09/20/2014, 11:24 PM

## 2014-09-21 LAB — GLUCOSE, CAPILLARY
GLUCOSE-CAPILLARY: 297 mg/dL — AB (ref 70–99)
GLUCOSE-CAPILLARY: 159 mg/dL — AB (ref 70–99)
Glucose-Capillary: 257 mg/dL — ABNORMAL HIGH (ref 70–99)
Glucose-Capillary: 369 mg/dL — ABNORMAL HIGH (ref 70–99)

## 2014-09-21 LAB — TSH: TSH: 1.03 u[IU]/mL (ref 0.350–4.500)

## 2014-09-21 MED ORDER — DULOXETINE HCL 60 MG PO CPEP
60.0000 mg | ORAL_CAPSULE | Freq: Two times a day (BID) | ORAL | Status: DC
  Administered 2014-09-21 – 2014-09-27 (×12): 60 mg via ORAL
  Filled 2014-09-21 (×11): qty 1
  Filled 2014-09-21: qty 6
  Filled 2014-09-21 (×3): qty 1
  Filled 2014-09-21: qty 6
  Filled 2014-09-21: qty 1

## 2014-09-21 NOTE — Clinical Social Work Note (Signed)
Per morning nursing report, HI toward wife is resolved. Denies SI/HI at this time. Will continue to assess during treatment.  Samuella BruinKristin Deidrick Rainey, MSW, [employer]CSWA Clinical Social Worker [facility] [phone]

## 2014-09-21 NOTE — Progress Notes (Signed)
Pt has been observed sitting in the dayroom talking, laughing, joking with peers, and watching TV.  Pt continues to c/o neck pain and asks for stronger pain meds.  Pt is informed that he needs to consult with his MD if he wants med changes to his pain meds.  Pt still depressed about his home situation and has passive SI, but can contract for safety.  He denies HI/AV.  [name] discharge plans are still in process.  Pt says he has attended some of the groups today.  He makes his needs known to staff.  His blood sugar levels are staying in the 200's.  He tells Clinical research associatewriter again that his Lantus dose at home was 30 units instead of the 20 units he is receiving here.  This information was passed on to the shift this morning in report.  Support and encouragement offered.  Meds given as ordered.  Safety maintained with q15 minute checks.

## 2014-09-21 NOTE — Plan of Care (Signed)
Problem: Alteration in mood Goal: LTG-Patient reports reduction in suicidal thoughts (Patient reports reduction in suicidal thoughts and is able to verbalize a safety plan for whenever patient is feeling suicidal)  Outcome: Progressing Patient denies SI today. Goal: LTG-[name] behavior demonstrates decreased signs of depression 11/11: Goal not met: Pt presents with flat affect and depressed mood. Pt with depression rating of 10. Pt to show decreased sign of depression and a rating of 3 or less before d/c.   Tilden Fossa, MSW, [place] Worker [facility] [phone]     (Patient's behavior demonstrates decreased signs of depression to the point the patient is safe to return home and continue treatment in an outpatient setting)  Outcome: Not Progressing Pt continues to rate his depression 8/10 today. [name] mood and affect is depressed and flat.  Problem: Diagnosis: Increased Risk For Suicide Attempt Goal: STG-Patient Will Attend All Groups On The Unit Outcome: Progressing Pt has attended groups today. Goal: STG-Patient Will Comply With Medication Regime Outcome: Progressing Pt has been cooperative and has complied with all medications today.

## 2014-09-21 NOTE — Progress Notes (Signed)
D: Patients CBG 369 at 1626pm. Pt denies any symptoms.  A: May, NP made aware. Pt 1700 scheduled Novolog administered per providers orders (See MAR). Pt encouraged to make appropriate meal choice at dinner. Diet education provided by RN. R: Pt cooperative and receptive.

## 2014-09-21 NOTE — [facility] Group Notes (Signed)
[facility] Group Notes:  (Nursing/MHT/Case Management/Adjunct)  Date:  09/21/2014  Time:  12:55 PM  Type of Therapy:  Nurse Education  Participation Level:  Active  Participation Quality:  Appropriate  Affect:  Flat  Cognitive:  Alert and Appropriate  Insight:  Lacking  Engagement in Group:  Engaged  Modes of Intervention:  Education and Support  Summary of Progress/Problems:  [name], [name] 09/21/2014, 12:55 PM

## 2014-09-21 NOTE — Progress Notes (Signed)
[facility]  09/21/2014 3:44 PM [name]  MRN:  [id number] Subjective:  Patient reports ongoing anxiety, depression, but admits to improvement compared to admission Objective: Patient describes ongoing symptoms as above, but he does present partially improved compared to admission. Patient has been going to most groups and is active in milieu, interactive with peers. His behavior has been in good control and he has not presented with any agitation or self injurious behaviors. No longer harboring any active HI towards wife, but states he remains angry and feels " overwhelmed" when he thinks about this. Tolerating medications well. Describes chronic pain issues, but does not appear to be uncomfortable or in acute distress. We reviewed rationale to avoid use of opiates/ addictive medications. Patient feels anxious and somewhat jittery and reports waking up often, feeling "sweaty". Vitals are stable and he does not appear to be restless. No distal tremors noted  TSH within normal limits  Diagnosis:  Bipolar Disorder Depressed versus Substance Induced Mood Disorder Depressed. Alcohol Dependence, Cocaine Dependence  Total Time spent with patient: 20 minutes   ADL's:  Improved   Sleep: improved   Appetite: improved   Suicidal Ideation:  Denies any suicidal ideations Homicidal Ideation:  Denies any homicidal ideations at this time AEB (as evidenced by):  Psychiatric Specialty Exam: Physical Exam  Review of Systems  Constitutional: Negative for fever and chills.  Respiratory: Negative for cough and shortness of breath.   Cardiovascular: Negative for chest pain.  Gastrointestinal: Negative for vomiting and blood in stool.  Psychiatric/Behavioral: Positive for depression and substance abuse. The patient is nervous/anxious.     Blood pressure 130/77, pulse 87, temperature 98 F (36.7 C), temperature source Oral, resp. rate 20, height 5\' 11"  (1.803 m), weight 79.833 kg  (176 lb).Body mass index is 24.56 kg/(m^2).  General Appearance: Fairly Groomed  Patent attorneyye Contact::  Good  Speech:  Normal Rate  Volume:  Decreased  Mood:  Depressed  Affect:  Constricted and but more reactive today  Thought Process:  Linear  Orientation:  Other:  fully alert and attentive  Thought Content:  denies hallucinations, no delusions, not as ruminative about stressors today  Suicidal Thoughts:  No  at this time denies any thoughts of hurting self and  contracts for safety on unit   Homicidal Thoughts:  No- at this time no active homicidal ideations towards wife   Memory:  Recent and Remote grossly intact  Judgement:  Fair  Insight:  Fair  Psychomotor Activity:  Normal  Concentration:  Good  Recall:  Good  Fund of Knowledge:Good  Language: Good  Akathisia:  Negative  Handed:  Right  AIMS (if indicated):     Assets:  Desire for Improvement Resilience  Sleep:  Number of Hours: 6.25   Musculoskeletal: Strength & Muscle Tone: within normal limits Gait & Station: normal Patient leans: N/A  Current Medications: Current Facility-Administered Medications  Medication Dose Route Frequency Provider Last Rate Last Dose  . acetaminophen (TYLENOL) tablet 650 mg  650 mg Oral Q6H PRN Kerry HoughSpencer E Simon, PA-C   650 mg at 09/21/14 1420  . albuterol (PROVENTIL HFA;VENTOLIN HFA) 108 (90 BASE) MCG/ACT inhaler 2 puff  2 puff Inhalation Q6H PRN Kerry HoughSpencer E Simon, PA-C      . alum & mag hydroxide-simeth (MAALOX/MYLANTA) 200-200-20 MG/5ML suspension 30 mL  30 mL Oral Q4H PRN Kerry HoughSpencer E Simon, PA-C      . ARIPiprazole (ABILIFY) tablet 2 mg  2 mg Oral Daily Rucha Wissinger A Taras Rask,  MD   2 mg at 09/21/14 0826  . azelastine (ASTELIN) 0.1 % nasal spray 2 spray  2 spray Each Nare BID Kerry Hough, PA-C   2 spray at 09/21/14 0272  . chlordiazePOXIDE (LIBRIUM) capsule 25 mg  25 mg Oral Q6H PRN Kerry Hough, PA-C   25 mg at 09/20/14 1854  . chlordiazePOXIDE (LIBRIUM) capsule 25 mg  25 mg Oral TID Kerry Hough, PA-C   25 mg at 09/21/14 1105   Followed by  . [START ON 09/22/2014] chlordiazePOXIDE (LIBRIUM) capsule 25 mg  25 mg Oral BH-qamhs Spencer E Simon, PA-C       Followed by  . [START ON 09/23/2014] chlordiazePOXIDE (LIBRIUM) capsule 25 mg  25 mg Oral Daily Kerry Hough, PA-C      . DULoxetine (CYMBALTA) DR capsule 60 mg  60 mg Oral BID Craige Cotta, MD      . hydrOXYzine (ATARAX/VISTARIL) tablet 50 mg  50 mg Oral Daily PRN Kerry Hough, PA-C   50 mg at 09/21/14 0834  . ibuprofen (ADVIL,MOTRIN) tablet 600 mg  600 mg Oral Q6H PRN Craige Cotta, MD   600 mg at 09/21/14 0834  . insulin aspart (novoLOG) injection 0-15 Units  0-15 Units Subcutaneous TID WC Kerry Hough, PA-C   8 Units at 09/21/14 1216  . insulin aspart (novoLOG) injection 0-5 Units  0-5 Units Subcutaneous QHS Kerry Hough, PA-C   2 Units at 09/20/14 2146  . insulin glargine (LANTUS) injection 20 Units  20 Units Subcutaneous QHS Shuvon Rankin, NP   20 Units at 09/20/14 2147  . lamoTRIgine (LAMICTAL) tablet 150 mg  150 mg Oral Daily Shuvon Rankin, NP   150 mg at 09/21/14 0826  . loperamide (IMODIUM) capsule 2-4 mg  2-4 mg Oral PRN Kerry Hough, PA-C      . loratadine (CLARITIN) tablet 10 mg  10 mg Oral Daily Shuvon Rankin, NP   10 mg at 09/21/14 0826  . magnesium hydroxide (MILK OF MAGNESIA) suspension 30 mL  30 mL Oral Daily PRN Shuvon Rankin, NP      . metFORMIN (GLUCOPHAGE) tablet 1,000 mg  1,000 mg Oral BID WC Kerry Hough, PA-C   1,000 mg at 09/21/14 0826  . multivitamin with minerals tablet 1 tablet  1 tablet Oral Daily Kerry Hough, PA-C   1 tablet at 09/21/14 5366  . nicotine (NICODERM CQ - dosed in mg/24 hours) patch 21 mg  21 mg Transdermal Daily Shuvon Rankin, NP   21 mg at 09/21/14 0833  . ondansetron (ZOFRAN-ODT) disintegrating tablet 4 mg  4 mg Oral Q6H PRN Kerry Hough, PA-C      . pioglitazone (ACTOS) tablet 30 mg  30 mg Oral Daily Shuvon Rankin, NP   30 mg at 09/21/14 0826  .  simvastatin (ZOCOR) tablet 40 mg  40 mg Oral QHS Shuvon Rankin, NP   40 mg at 09/20/14 2133  . thiamine (VITAMIN B-1) tablet 100 mg  100 mg Oral Daily Kerry Hough, PA-C   100 mg at 09/21/14 4403  . traZODone (DESYREL) tablet 100 mg  100 mg Oral QHS Shuvon Rankin, NP   100 mg at 09/20/14 2133    Lab Results:  Results for orders placed or performed during the hospital encounter of 09/19/14 (from the past 48 hour(s))  Glucose, capillary     Status: Abnormal   Collection Time: 09/19/14 11:25 PM  Result Value Ref Range  Glucose-Capillary 231 (H) 70 - 99 mg/dL  Glucose, capillary     Status: Abnormal   Collection Time: 09/20/14  6:22 AM  Result Value Ref Range   Glucose-Capillary 152 (H) 70 - 99 mg/dL  Glucose, capillary     Status: Abnormal   Collection Time: 09/20/14 11:44 AM  Result Value Ref Range   Glucose-Capillary 285 (H) 70 - 99 mg/dL   Comment 1 Notify RN   Glucose, capillary     Status: Abnormal   Collection Time: 09/20/14  4:31 PM  Result Value Ref Range   Glucose-Capillary 267 (H) 70 - 99 mg/dL   Comment 1 Notify RN   Glucose, capillary     Status: Abnormal   Collection Time: 09/20/14  9:27 PM  Result Value Ref Range   Glucose-Capillary 216 (H) 70 - 99 mg/dL  Glucose, capillary     Status: Abnormal   Collection Time: 09/21/14  5:55 AM  Result Value Ref Range   Glucose-Capillary 257 (H) 70 - 99 mg/dL  TSH     Status: None   Collection Time: 09/21/14  6:30 AM  Result Value Ref Range   TSH 1.030 0.350 - 4.500 uIU/mL    Comment: [facility]  Glucose, capillary     Status: Abnormal   Collection Time: 09/21/14 11:58 AM  Result Value Ref Range   Glucose-Capillary 297 (H) 70 - 99 mg/dL   Comment 1 Notify RN     Physical Findings: AIMS: Facial and Oral Movements Muscles of Facial Expression: None, normal Lips and Perioral Area: None, normal Jaw: None, normal Tongue: None, normal,Extremity Movements Upper (arms, wrists, hands, fingers): None,  normal Lower (legs, knees, ankles, toes): None, normal, Trunk Movements Neck, shoulders, hips: None, normal, Overall Severity Severity of abnormal movements (highest score from questions above): None, normal Incapacitation due to abnormal movements: None, normal Patient's awareness of abnormal movements (rate only patient's report): No Awareness, Dental Status Current problems with teeth and/or dentures?: No Does patient usually wear dentures?: No  CIWA:  CIWA-Ar Total: 2 COWS:  COWS Total Score: 2   Assessment:  Patient remains depressed, anxious, but at this time not actively suicidal and no longer reporting homicidal ideations towards wife. He is still ruminative. He has been active in milieu/groups.  No significant withdrawal at this time , tolerating medications well  Treatment Plan Summary: Daily contact with patient to assess and evaluate symptoms and progress in treatment Medication management See below  Plan: Continue inpatient treatment, milieu, support Continue Librium detox protocol Continue Abilify 2 mgrs QDAY Continue Cymbalta  At 60 mgrs BID  Continue Lamictal 150 mgrs QAM Ibuprofen PRN for pain  Medical Decision Making Problem Points:  Established problem, stable/improving (1), Review of last therapy session (1) and Review of psycho-social stressors (1) Data Points:  Review or order clinical lab tests (1) Review of medication regiment & side effects (2) Review of new medications or change in dosage (2)  I certify that inpatient services furnished can reasonably be expected to improve the patient's condition.   Sharian Delia 09/21/2014, 3:44 PM

## 2014-09-21 NOTE — Progress Notes (Signed)
Adult Psychoeducational Group Note  Date:  09/21/2014 Time:  12:07 PM  Group Topic/Focus:  Goals Group:   The focus of this group is to help patients establish daily goals to achieve during treatment and discuss how the patient can incorporate goal setting into their daily lives to aide in recovery.  Participation Level:  Active  Participation Quality:  Appropriate  Affect:  Appropriate  Cognitive:  Appropriate  Insight: Appropriate  Engagement in Group:  Engaged  Modes of Intervention:  Discussion  Additional Comments:  Pt stated that he is experiencing homicidal thoughts ever since wife moved out. Pt thoughts are towards his ex wife and her new partner.Pt goal is to stop homicidal thoughts.  Sherryann Frese, Alfredia ClientAndreia 09/21/2014, 12:07 PM

## 2014-09-21 NOTE — Clinical Social Work Note (Signed)
CSW attempted to contact patient's father Hanley SeamenJeff Hinch [phone]. No answer at this time.  Samuella BruinKristin Marcellino Fidalgo, MSW, [employer]CSWA Clinical Social Worker [facility] [phone]

## 2014-09-21 NOTE — [facility] Group Notes (Signed)
[facility] LCSW Group Therapy 09/21/2014  1:15 pm   Type of Therapy: Group Therapy Participation Level: Active  Participation Quality: Attentive, and Supportive  Affect: Depressed and Flat  Cognitive: Alert and Oriented  Insight: Developing/Improving and Engaged  Engagement in Therapy: Developing/Improving and Engaged  Modes of Intervention: Clarification, Confrontation, Discussion, Education, Exploration, Limit-setting, Orientation, Problem-solving, Rapport Building, Dance movement psychotherapisteality Testing, Socialization and Support  Summary of Progress/Problems: The topic for group was balance in life. Today's group focused on defining balance in one's own words, identifying things that can knock one off balance, and exploring healthy ways to maintain balance in life. Group members were asked to provide an example of a time when they felt off balance, describe how they handled that situation,and process healthier ways to regain balance in the future. Group members were asked to share the most important tool for maintaining balance that they [facility] and how they plan to apply this method after discharge. Patient actively listened during group but did not wish to share personally on the topic. Patient reports that he is not feeling well today and has been experiencing feelings of anger and being overwhelmed. CSW's provided emotional support and encouragement.  Samuella BruinKristin Daren Doswell, MSW, [employer]CSWA Clinical Social Worker [facility] [phone]

## 2014-09-21 NOTE — Progress Notes (Signed)
D: Patient is alert and oriented. [name] mood and affect are depressed and flat. Pt reports depression and hopelessness 8/10 and anxiety 10/10. Pt denies SI. When asked if he has any thoughts of HI, pt states "not this morning." Pt denies AVH. Pt states his goal for the day is to "feeling less depressed." Pt complains of chronic neck pain 9/10. Pt has concerns that he would like to speak to the doctor about his pain medications. Pt attending groups. A: Pt encouraged to talk to provider about pain medications. PRN medications administered per providers orders for pain and anxiety (See MAR). Scheduled medications administered per providers orders (See MAR). 15 minute checks completed per protocol for pt safety. R: Pt agreed to come to RN if feelings of HI arise. Pt cooperative and receptive to nursing interventions.

## 2014-09-21 NOTE — Clinical Social Work Note (Signed)
CSW left message for [facility] regarding scheduling upcoming therapy appt. With [employer] Br[employer]Julie Whitt. Awaiting return call.  Samuella BruinKristin Jovanny Stephanie, MSW, [employer]CSWA Clinical Social Worker [facility] [phone]

## 2014-09-22 LAB — GLUCOSE, CAPILLARY
GLUCOSE-CAPILLARY: 325 mg/dL — AB (ref 70–99)
GLUCOSE-CAPILLARY: 216 mg/dL — AB (ref 70–99)
Glucose-Capillary: 229 mg/dL — ABNORMAL HIGH (ref 70–99)
Glucose-Capillary: 310 mg/dL — ABNORMAL HIGH (ref 70–99)

## 2014-09-22 MED ORDER — LAMOTRIGINE 100 MG PO TABS
100.0000 mg | ORAL_TABLET | Freq: Every day | ORAL | Status: DC
  Administered 2014-09-23 – 2014-09-27 (×5): 100 mg via ORAL
  Filled 2014-09-22 (×5): qty 1
  Filled 2014-09-22: qty 3
  Filled 2014-09-22: qty 1

## 2014-09-22 MED ORDER — LAMOTRIGINE 25 MG PO TABS
75.0000 mg | ORAL_TABLET | Freq: Every day | ORAL | Status: DC
  Administered 2014-09-23 – 2014-09-26 (×4): 75 mg via ORAL
  Filled 2014-09-22: qty 3
  Filled 2014-09-22: qty 9
  Filled 2014-09-22: qty 1
  Filled 2014-09-22 (×5): qty 3

## 2014-09-22 MED ORDER — INSULIN GLARGINE 100 UNIT/ML ~~LOC~~ SOLN
25.0000 [IU] | Freq: Every day | SUBCUTANEOUS | Status: DC
  Administered 2014-09-22 – 2014-09-23 (×2): 25 [IU] via SUBCUTANEOUS

## 2014-09-22 MED ORDER — IBUPROFEN 800 MG PO TABS
800.0000 mg | ORAL_TABLET | Freq: Four times a day (QID) | ORAL | Status: DC | PRN
  Administered 2014-09-22 – 2014-09-27 (×12): 800 mg via ORAL
  Filled 2014-09-22 (×12): qty 1

## 2014-09-22 MED ORDER — QUETIAPINE FUMARATE 25 MG PO TABS
25.0000 mg | ORAL_TABLET | Freq: Three times a day (TID) | ORAL | Status: DC | PRN
  Administered 2014-09-22 – 2014-09-23 (×3): 25 mg via ORAL
  Filled 2014-09-22 (×3): qty 1

## 2014-09-22 NOTE — Clinical Social Work Note (Signed)
CSW attempted to contact patient's father Hanley SeamenJeff Thier [phone], left voicemail. Awaiting return call.  Samuella BruinKristin Waverley Krempasky, MSW, [employer]CSWA Clinical Social Worker [facility] [phone]

## 2014-09-22 NOTE — Progress Notes (Signed)
D: Patient in the hallway on approach.  Patient states his day was ok.  Patient states he was given pain medications and states his pain is getting better. Patient states he feels he cannot be honest in group becaue he will get told on.  Patient stated he was having homocidal thoughts towards the mother of his children and he told the social worker in group and she explained duty to warn to him.  APtient states from now on he will not be honest because she will tell the police on him.  Patient states he did not have a goal today. Patient denies SI/HI and denies AVH. A: Staff to monitor Q 15 mins for safety.  Encouragement and support offered.  Scheduled medications administered per orders. Seroquel administered prn for anxiety. R: Patient remains safe on the unit.  Patient attended group tonight.  Patient visible on the unit and interacting on the unit. Patient taking administered medications.

## 2014-09-22 NOTE — Progress Notes (Signed)
D   Pt continues to complain of neck pain and request medications frequently   He interacts well with others and attends and participates in most groups    He appears depressed and anxious  A   Verbal support given   Medications administered and effectiveness monitored   Q 15 min checks R   Pt safe at present

## 2014-09-22 NOTE — Clinical Social Work Note (Signed)
CSW met with patient after Discharge Planning group to discuss duty to warn and his vocalization of HI towards his wife. CSW explained legal responsibility to inform wife of his thoughts to harm her. Patient clarified that he did not have a plan at this time. Patient provided CSW with wife's name but no further contact information.   CSW contacted patient's wife Bard Haupert, using emergency contact telephone number from his record. CSW explained purpose of call and informed wife that patient was admitted to the hospital with thoughts of harming her. CSW informed wife that due to HIPPA, CSW not able to disclose any treatment information including discharge date. CSW encouraged wife to contact law enforcement if she were to feel unsafe at any point. Wife verbalized her understanding, had no further questions for CSW.   Tilden Fossa, MSW, [place] Worker [facility] [phone]

## 2014-09-22 NOTE — Tx Team (Addendum)
Interdisciplinary Treatment Plan Update (Adult) Date: 09/22/2014   Time Reviewed: 9:30 AM  Progress in Treatment: Attending groups: Yes Participating in groups: Yes Taking medication as prescribed: Yes Tolerating medication: Yes Family/Significant other contact made: No, CSW attempting to contact patient's father [name] Patient understands diagnosis: Yes Discussing patient identified problems/goals with staff: Yes Medical problems stabilized or resolved: Yes Denies suicidal/homicidal ideation: Treatment team continuing to assess Issues/concerns per patient self-inventory: Yes Other:  New problem(s) identified: N/A  Discharge Plan or Barriers: Patient agreeable to return home to follow up with his current outpatient providers [facility] and [facility]. Per MD, Duty to Warn wife warranted as patient has expressed HI towards wife at admission and during hospitalization.  Reason for Continuation of Hospitalization:  Depression Anxiety Medication Stabilization   Comments: N/A  Estimated length of stay: 3-5 days  For review of initial/current patient goals, please see plan of care. Patient is a 38 year old Caucasian Male with a diagnosis of Bipolar, Depressed and Alcohol Abuse; Cocaine Abuse. [place] 2 teenage children. Patient reports that he is currently hospitalized following a relapse on alcohol and cocaine and experiencing SI and HI towards his wife. Patient identifies his stressors as finding out in June 2015 that his wife has stage III colon cancer and then in August that his wife was having an affair. Wife left patient and he also discovered that bills were not being paid. He also lost his job recently. Patient reports that he has been experiencing SI/HI for periodically for approximately 1 month but that these thoughts have increased overt the past week. Patient denies any  SI/HI at this time. Patient was calm and cooperative during assessment with flat and depressed affect. Patient has had multiple previous admissions. Patient declines residential treatment at this time and plans to return home to follow up with his current outpatient providers at discharge. His goal is to find "peace of mind." Patient will benefit from crisis stabilization, medication evaluation, group therapy, and psycho education in addition to case management for discharge planning. Patient and CSW reviewed pt's identified goals and treatment plan. Pt verbalized understanding and agreed to treatment plan.  Attendees: Patient:    Family:    Physician: Dr. Jama Flavorsobos 09/22/2014 9:30 AM  Nursing: Earl ManySara Twyman, Quintella ReichertBeverly Knight, RN 09/22/2014 9:30 AM  Clinical Social Worker: Samuella BruinKristin Hodaya Curto, LCSWA 09/22/2014 9:30 AM  Other: Juline PatchQuylle Hodnett, LCSW 09/22/2014 9:30 AM  Other: Leisa LenzValerie Enoch, Vesta MixerMonarch Liaison 09/22/2014 9:30 AM  Other: Onnie BoerJennifer Clark, Case Manager 09/22/2014 9:30 AM  Other: Tomasita Morrowelora Sutton, P4CC 09/22/2014 9:30 AM  Other: Santa GeneraAnne Cunningham, LCSW 09/22/2014 9:30 AM  Other:    Other:    Other:    Other:       Scribe for Treatment Team:  Samuella BruinKristin Delisa Finck, MSW, [employer]CSWA [phone]

## 2014-09-22 NOTE — [facility] Group Notes (Signed)
[facility] LCSW Group Therapy 09/22/2014 1:15 PM Type of Therapy: Group Therapy Participation Level: Active  Participation Quality: Attentive, Sharing and Supportive  Affect: Depressed and Flat  Cognitive: Alert and Oriented  Insight: Developing/Improving and Engaged  Engagement in Therapy: Developing/Improving and Engaged  Modes of Intervention: Clarification, Confrontation, Discussion, Education, Exploration, Limit-setting, Orientation, Problem-solving, Rapport Building, Dance movement psychotherapisteality Testing, Socialization and Support  Summary of Progress/Problems: The topic for today was feelings about relapse. Pt discussed what relapse prevention is to them and identified triggers that they are on the path to relapse. Pt processed their feeling towards relapse and was able to relate to peers. Pt discussed coping skills that can be used for relapse prevention. Patient discussed that he focuses on others more than himself. He discussed his need for a stronger support system through church and AA/NA. CSW's and other group members provided emotional support and encouragement.   Samuella BruinKristin Yahia Bottger, MSW, [employer]CSWA Clinical Social Worker [facility] [phone]

## 2014-09-22 NOTE — [facility] Group Notes (Signed)
Adult Psychoeducational Group Note  Date:  09/22/2014 Time:  10:06 PM  Group Topic/Focus:  AA Meeting  Participation Level:  Minimal  Participation Quality:  Appropriate  Affect:  Flat  Cognitive:  Alert  Insight: Limited  Engagement in Group:  Lacking  Modes of Intervention:  Discussion and Education  Additional Comments:  Tinnie GensJeffrey attended group.  Caroll RancherLindsay, Laya Letendre A 09/22/2014, 10:06 PM

## 2014-09-22 NOTE — [facility] Group Notes (Signed)
   [facility] Note  09/22/2014  8:45 AM   Participation Quality: Alert, Appropriate and Oriented  Mood/Affect: Depressed and Flat  Depression Rating: 10  Anxiety Rating: 10  Thoughts of Suicide: Pt endorses passive SI/HI  Will you contract for safety? Yes  Current AVH: Pt denies  Plan for Discharge/Comments: Pt attended discharge planning group and actively participated in group. CSW provided pt with today's workbook. Patient reports that he is "not good, depressed" today. Patient appeared with flat and depressed affect.  Patient plans to return home to follow up with his current outpatient [facility] and [facility].  Transportation Means: Pt reports access to transportation  Supports: No supports mentioned at this time  Samuella BruinKristin Tavon Magnussen, MSW, [employer]CSWA Clinical Social Worker [facility] [phone]

## 2014-09-22 NOTE — Progress Notes (Signed)
Patient ID: [name], male   DOB: [birth year], 38 y.o.   MRN: [id number] [facility] MD Progress Note  09/22/2014 1:58 PM [name]  MRN:  [id number] Subjective: Patient reports  He continues to feel anxious and depressed . Objective: I have discussed case with treatment team and have met with patient.  As discussed with staff he has continued to report depression, anxiety  and intermittent /vague homicidal ideations directed at his wife. His behavior on unit has been calm and in good control. He has been going to some groups. He denies medication side effects, but does not feel medications are working as well as they should be.Patient  has been informed that due to HI expressed his wife is being contacted by staff, as per duty to warn obligations. He has expressed intermittent  HI thoughts , as per staff- at this time states he " is really angry" but denies any actual plan or current intention of hurting her. Patient  Is tolerating medications well, but states he still feels quite anxious, and that vistaril PRNs have not worked well for him. He also reports pain has been only partially addressed by Ibuprofen.   Diagnosis:  Bipolar Disorder Depressed versus Substance Induced Mood Disorder Depressed. Alcohol Dependence, Cocaine Dependence  Total Time spent with patient: 25 minutes   ADL's:  Improved   Sleep: improved   Appetite: improved   Suicidal Ideation:  Denies any suicidal ideations Homicidal Ideation:  Denies any homicidal ideations at this time AEB (as evidenced by):  Psychiatric Specialty Exam: Physical Exam  Review of Systems  Constitutional: Negative for fever and chills.  Respiratory: Negative for cough and shortness of breath.   Cardiovascular: Negative for chest pain.  Gastrointestinal: Negative for vomiting and blood in stool.  Psychiatric/Behavioral: Positive for depression and substance abuse. The patient is nervous/anxious.     Blood pressure 116/68,  pulse 91, temperature 97.8 F (36.6 C), temperature source Oral, resp. rate 18, height 5' 11" (1.803 m), weight 79.833 kg (176 lb).Body mass index is 24.56 kg/(m^2).  General Appearance: Fairly Groomed  Engineer, water::  Good  Speech:  Normal Rate  Volume:  Normal  Mood:  Depressed  Affect:  Constricted and slightly irritable  Thought Process:  Linear  Orientation:  Other:  fully alert and attentive  Thought Content:  No hallucinations, no delusions  Suicidal Thoughts:  No  at this time denies any thoughts of hurting self and  contracts for safety on unit   Homicidal Thoughts:  Has expressed intermittent, vague HI directed towards wife- at this time denies actual plan or intention of violence  Memory:  Recent and Remote grossly intact  Judgement:  Fair  Insight:  Fair  Psychomotor Activity:  Normal  Concentration:  Good  Recall:  Good  Fund of Knowledge:Good  Language: Good  Akathisia:  Negative  Handed:  Right  AIMS (if indicated):     Assets:  Desire for Improvement Resilience  Sleep:  Number of Hours: 6   Musculoskeletal: Strength & Muscle Tone: within normal limits Gait & Station: normal Patient leans: N/A  Current Medications: Current Facility-Administered Medications  Medication Dose Route Frequency Provider Last Rate Last Dose  . acetaminophen (TYLENOL) tablet 650 mg  650 mg Oral Q6H PRN Laverle Hobby, PA-C   650 mg at 09/22/14 1218  . albuterol (PROVENTIL HFA;VENTOLIN HFA) 108 (90 BASE) MCG/ACT inhaler 2 puff  2 puff Inhalation Q6H PRN Laverle Hobby, PA-C      .  alum & mag hydroxide-simeth (MAALOX/MYLANTA) 200-200-20 MG/5ML suspension 30 mL  30 mL Oral Q4H PRN Laverle Hobby, PA-C      . azelastine (ASTELIN) 0.1 % nasal spray 2 spray  2 spray Each Nare BID Laverle Hobby, PA-C   2 spray at 09/22/14 0730  . chlordiazePOXIDE (LIBRIUM) capsule 25 mg  25 mg Oral Q6H PRN Laverle Hobby, PA-C   25 mg at 09/21/14 2157  . chlordiazePOXIDE (LIBRIUM) capsule 25 mg  25 mg  Oral BH-qamhs Spencer Elicia Lamp, PA-C   25 mg at 09/22/14 0731   Followed by  . [START ON 09/23/2014] chlordiazePOXIDE (LIBRIUM) capsule 25 mg  25 mg Oral Daily Laverle Hobby, PA-C      . DULoxetine (CYMBALTA) DR capsule 60 mg  60 mg Oral BID Jenne Campus, MD   60 mg at 09/22/14 0731  . ibuprofen (ADVIL,MOTRIN) tablet 800 mg  800 mg Oral Q6H PRN Myer Peer Cobos, MD      . insulin aspart (novoLOG) injection 0-15 Units  0-15 Units Subcutaneous TID WC Laverle Hobby, PA-C   5 Units at 09/22/14 1217  . insulin aspart (novoLOG) injection 0-5 Units  0-5 Units Subcutaneous QHS Laverle Hobby, PA-C   2 Units at 09/20/14 2146  . insulin glargine (LANTUS) injection 25 Units  25 Units Subcutaneous QHS Jenne Campus, MD      . Derrill Memo ON 09/23/2014] lamoTRIgine (LAMICTAL) tablet 100 mg  100 mg Oral Daily Jenne Campus, MD      . Derrill Memo ON 09/23/2014] lamoTRIgine (LAMICTAL) tablet 75 mg  75 mg Oral QHS Myer Peer Cobos, MD      . loperamide (IMODIUM) capsule 2-4 mg  2-4 mg Oral PRN Laverle Hobby, PA-C      . loratadine (CLARITIN) tablet 10 mg  10 mg Oral Daily Shuvon Rankin, NP   10 mg at 09/22/14 0732  . magnesium hydroxide (MILK OF MAGNESIA) suspension 30 mL  30 mL Oral Daily PRN Shuvon Rankin, NP      . metFORMIN (GLUCOPHAGE) tablet 1,000 mg  1,000 mg Oral BID WC Laverle Hobby, PA-C   1,000 mg at 09/22/14 0733  . multivitamin with minerals tablet 1 tablet  1 tablet Oral Daily Laverle Hobby, PA-C   1 tablet at 09/22/14 8299  . nicotine (NICODERM CQ - dosed in mg/24 hours) patch 21 mg  21 mg Transdermal Daily Shuvon Rankin, NP   21 mg at 09/22/14 0734  . ondansetron (ZOFRAN-ODT) disintegrating tablet 4 mg  4 mg Oral Q6H PRN Laverle Hobby, PA-C      . pioglitazone (ACTOS) tablet 30 mg  30 mg Oral Daily Shuvon Rankin, NP   30 mg at 09/22/14 0734  . QUEtiapine (SEROQUEL) tablet 25 mg  25 mg Oral TID PRN Jenne Campus, MD      . simvastatin (ZOCOR) tablet 40 mg  40 mg Oral QHS Shuvon  Rankin, NP   40 mg at 09/21/14 2158  . thiamine (VITAMIN B-1) tablet 100 mg  100 mg Oral Daily Laverle Hobby, PA-C   100 mg at 09/22/14 3716  . traZODone (DESYREL) tablet 100 mg  100 mg Oral QHS Shuvon Rankin, NP   100 mg at 09/21/14 2158    Lab Results:  Results for orders placed or performed during the hospital encounter of 09/19/14 (from the past 48 hour(s))  Glucose, capillary     Status: Abnormal   Collection Time: 09/20/14  4:31 PM  Result Value Ref Range   Glucose-Capillary 267 (H) 70 - 99 mg/dL   Comment 1 Notify RN   Glucose, capillary     Status: Abnormal   Collection Time: 09/20/14  9:27 PM  Result Value Ref Range   Glucose-Capillary 216 (H) 70 - 99 mg/dL  Glucose, capillary     Status: Abnormal   Collection Time: 09/21/14  5:55 AM  Result Value Ref Range   Glucose-Capillary 257 (H) 70 - 99 mg/dL  TSH     Status: None   Collection Time: 09/21/14  6:30 AM  Result Value Ref Range   TSH 1.030 0.350 - 4.500 uIU/mL    Comment: Performed at [facility]  Glucose, capillary     Status: Abnormal   Collection Time: 09/21/14 11:58 AM  Result Value Ref Range   Glucose-Capillary 297 (H) 70 - 99 mg/dL   Comment 1 Notify RN   Glucose, capillary     Status: Abnormal   Collection Time: 09/21/14  4:26 PM  Result Value Ref Range   Glucose-Capillary 369 (H) 70 - 99 mg/dL  Glucose, capillary     Status: Abnormal   Collection Time: 09/21/14  8:38 PM  Result Value Ref Range   Glucose-Capillary 159 (H) 70 - 99 mg/dL  Glucose, capillary     Status: Abnormal   Collection Time: 09/22/14  5:49 AM  Result Value Ref Range   Glucose-Capillary 325 (H) 70 - 99 mg/dL  Glucose, capillary     Status: Abnormal   Collection Time: 09/22/14 12:08 PM  Result Value Ref Range   Glucose-Capillary 216 (H) 70 - 99 mg/dL   Comment 1 Notify RN     Physical Findings: AIMS: Facial and Oral Movements Muscles of Facial Expression: None, normal Lips and Perioral Area: None, normal Jaw: None,  normal Tongue: None, normal,Extremity Movements Upper (arms, wrists, hands, fingers): None, normal Lower (legs, knees, ankles, toes): None, normal, Trunk Movements Neck, shoulders, hips: None, normal, Overall Severity Severity of abnormal movements (highest score from questions above): None, normal Incapacitation due to abnormal movements: None, normal Patient's awareness of abnormal movements (rate only patient's report): No Awareness, Dental Status Current problems with teeth and/or dentures?: No Does patient usually wear dentures?: No  CIWA:  CIWA-Ar Total: 5 COWS:  COWS Total Score: 6   Assessment:  Still depressed , anxious, ruminative. No SI.  Intermittently having vague homicidal ideations towards wife. Although denies plan or intention of acting out on these today,  Thoughts have been persistent/intermittent and treatment team has agreed that duty to warn needs to be implemented.  Will try Seroquel PRNS for anxiety symptoms. We discussed rationale and side effect profile and patient agrees  Treatment Plan Summary: Daily contact with patient to assess and evaluate symptoms and progress in treatment Medication management See below  Plan: Continue inpatient treatment, milieu, support Continue Librium detox protocol D/CAbilify 2 mgrs QDAY Start Seroquel 25 mgrs TID PRN Anxiety Continue Cymbalta  At 60 mgrs BID  Increase Lamictal  To 100 mgrs QAM and 75 mgrs QHS Increase Ibuprofen to 800 mgrs QID Patient states he was on higher dose of Lantus at home ( 30 QDAY).Serum glucose tends to be elevated - will increase to 25 U [place] QHS.   Medical Decision Making Problem Points:  Established problem, stable/improving (1), Review of last therapy session (1) and Review of psycho-social stressors (1) Data Points:  Review or order clinical lab tests (1) Review of medication regiment & side  effects (2) Review of new medications or change in dosage (2)  I certify that inpatient services  furnished can reasonably be expected to improve the patient's condition.   COBOS, FERNANDO 09/22/2014, 1:58 PM  

## 2014-09-22 NOTE — Progress Notes (Signed)
D:  Patient's self inventory sheet, patient had poor sleep last night, did not take sleep medication.  Good appetite, low energy level, poor concentration.  Rated depression and anxiety 8, hopeless 10.  Withdrawals of tremors, cravings, agitation, irritability.  SI occasionally.  Physical problems, headaches, worst pain #7, neck shoulders.  Pain medication is not helpful.  Goal is to talk about med changes for pain, sleep and racing thoughts. A:  Medications administered per MD orders.  Emotional support and encouragement given. R:  Denied SI and HI while talking to nurse.  Denied A/V hallucinations.  Safety maintained with 15 minute checks.  Patient went to recreation before lunch.  Came back and requested headache and anxiety medication.  Nurse gave vistaril 50 mg instead of librium, no tremors, sweating seen by nurse.   MD informed and patient informed that nurse discussed with MD.  No tremors or sweating visible after lunch.  Safety maintained with 15 minute checks.  Emotional support and encouragement given patient.

## 2014-09-23 DIAGNOSIS — F1020 Alcohol dependence, uncomplicated: Secondary | ICD-10-CM

## 2014-09-23 LAB — GLUCOSE, CAPILLARY
GLUCOSE-CAPILLARY: 253 mg/dL — AB (ref 70–99)
Glucose-Capillary: 221 mg/dL — ABNORMAL HIGH (ref 70–99)
Glucose-Capillary: 307 mg/dL — ABNORMAL HIGH (ref 70–99)
Glucose-Capillary: 469 mg/dL — ABNORMAL HIGH (ref 70–99)
Glucose-Capillary: 128 mg/dL — ABNORMAL HIGH (ref 70–99)

## 2014-09-23 MED ORDER — INSULIN ASPART 100 UNIT/ML ~~LOC~~ SOLN
20.0000 [IU] | Freq: Once | SUBCUTANEOUS | Status: AC
  Administered 2014-09-23: 20 [IU] via SUBCUTANEOUS

## 2014-09-23 MED ORDER — QUETIAPINE FUMARATE 25 MG PO TABS
25.0000 mg | ORAL_TABLET | Freq: Four times a day (QID) | ORAL | Status: DC | PRN
  Administered 2014-09-23 – 2014-09-25 (×7): 25 mg via ORAL
  Filled 2014-09-23 (×7): qty 1

## 2014-09-23 NOTE — Progress Notes (Signed)
Adult Psychoeducational Group Note  Date:  09/23/2014 Time:  11:49 PM  Group Topic/Focus:  Wrap-Up Group:   The focus of this group is to help patients review their daily goal of treatment and discuss progress on daily workbooks.  Participation Level:  Active  Participation Quality:  Appropriate, Attentive and Sharing  Affect:  Appropriate  Cognitive:  Appropriate  Insight: Appropriate  Engagement in Group:  Engaged  Modes of Intervention:  Support  Additional Comments:  Pt attended group and stated his goal for today was to try not to stay in his head all day. Pt stated he was able to accomplish this because he has been up joking around and interacting with others which helps keep his brain busy.  Caswell CorwinOwen, Trey Gulbranson C 09/23/2014, 11:49 PM

## 2014-09-23 NOTE — Plan of Care (Signed)
Problem: Diagnosis: Increased Risk For Suicide Attempt Goal: STG-Patient Will Report Suicidal Feelings to Staff Outcome: Progressing Pt reports to having some passive S/I no plans here in hospital and contracts verbally to come to staff before acting on any self-harm thoughts.

## 2014-09-23 NOTE — [facility] Group Notes (Signed)
[facility] Group Notes:  Healthy coping skills  Date:  09/23/2014  Time:  2:07 PM  Type of Therapy:  Nurse Education  Participation Level:  Active  Participation Quality:  Appropriate  Affect:  Appropriate  Cognitive:  Appropriate  Insight:  Appropriate  Engagement in Group:  Limited  Modes of Intervention:  Discussion  Summary of Progress/Problems:Pt listened but did not add to the discussion  Nicole CellaWebb, Ylonda Storr Guyes 09/23/2014, 2:07 PM

## 2014-09-23 NOTE — [facility] Group Notes (Signed)
[facility] Group Notes:  Self inventory  Date:  09/23/2014  Time:  9:29 AM  Type of Therapy:  Nurse Education  Participation Level:  Active  Participation Quality:  Appropriate  Affect:  Appropriate  Cognitive:  Appropriate  Insight:  Appropriate  Engagement in Group:  Engaged  Modes of Intervention:  Education  Summary of Progress/Problems:  [name], [name] 09/23/2014, 9:29 AM

## 2014-09-23 NOTE — Progress Notes (Addendum)
Pt has been up and active in the milieu today. He has been interacting with peers and staff appropriately. He rated his depression 7 and his hopelessness and anxiety a 8 on his self-inventory. He denies any A/V/H or H/I he does admit to some passive S/I with no plan here in the hospital and contracts to come to staff. His goal today,"try to sort out all the thoughts running through my head" he went on to say that,"he will try to talk about it" He follows up with Dr. Lolly MustacheArfeen and Raynelle FanningJulie downstairs.   [name] CBG was 469 at 1651. Phone call placed to Maura Crandallonrad W. NP and he gave a verbal order to hold off the sliding scale and give one-time dose of Novolog 20 units and rechecked in an hour.  He was 253 at 1818 and another call placed back to [place] NP no new orders at this time. Pt is aware that his blood sugar is consistently rising.  Although he continues to claim he has not been eating any food that would affect his blood sugar and is not being honest with his snack and food intake. He had several peers to come to this nurse and report that pt had been eating ice cream and cookies. When asked, pt denied.  We did spend some one on one time together and he did voice understanding. Will inform on-coming shift and patient is aware.

## 2014-09-23 NOTE — Progress Notes (Signed)
[facility] MD Progress Note  09/23/2014 12:01 PM [name]  MRN:  [id number] Subjective: Patient reports he continues to feel anxious and depressed but Seroquel is helping.  Objective: I have reviewed the chart and have met with patient.   Pt has been taking Seroquel and it was effective for about 3 hrs. It helped to mellow him out and calm him down. When it wears off he feel irritable, angry and agitated.   Depression is getting a little better. He slept well last night. Energy is low.   Denies SI and HI today. The thougths are getting further and further apart and less intense.   Denies SE from meds.   He has been attending groups.     Diagnosis:  Bipolar Disorder Depressed versus Substance Induced Mood Disorder Depressed. Alcohol Dependence, Cocaine Dependence  Total Time spent with patient: 25 minutes   ADL's:  Improved   Sleep: improved   Appetite: improved   Suicidal Ideation:  Denies any suicidal ideations Homicidal Ideation:  Denies any homicidal ideations at this time AEB (as evidenced by):  Psychiatric Specialty Exam: Physical Exam  Review of Systems  Constitutional: Negative for fever and chills.  Respiratory: Negative for cough and shortness of breath.   Cardiovascular: Negative for chest pain.  Gastrointestinal: Negative for vomiting and blood in stool.  Psychiatric/Behavioral: Positive for depression and substance abuse. The patient is nervous/anxious.     Blood pressure 150/73, pulse 111, temperature 97.5 F (36.4 C), temperature source Oral, resp. rate 18, height '5\' 11"'  (1.803 m), weight 79.833 kg (176 lb).Body mass index is 24.56 kg/(m^2).  General Appearance: Fairly Groomed  Engineer, water::  Good  Speech:  Normal Rate  Volume:  Normal  Mood:  Depressed  Affect:  Constricted and slightly irritable  Thought Process:  Linear  Orientation:  Other:  fully alert and attentive  Thought Content:  No hallucinations, no delusions  Suicidal Thoughts:  No   at this time denies any thoughts of hurting self and  contracts for safety on unit   Homicidal Thoughts:  Today denies. He Has expressed intermittent, vague HI directed towards wife- at this time denies actual plan or intention of violence  Memory:  Recent and Remote grossly intact  Judgement:  Fair  Insight:  Fair  Psychomotor Activity:  Normal  Concentration:  Good  Recall:  Good  Fund of Knowledge:Good  Language: Good  Akathisia:  Negative  Handed:  Right  AIMS (if indicated):     Assets:  Desire for Improvement Resilience  Sleep:  Number of Hours: 6.75   Musculoskeletal: Strength & Muscle Tone: within normal limits Gait & Station: normal Patient leans: N/A  Current Medications: Current Facility-Administered Medications  Medication Dose Route Frequency Provider Last Rate Last Dose  . acetaminophen (TYLENOL) tablet 650 mg  650 mg Oral Q6H PRN Laverle Hobby, PA-C   650 mg at 09/23/14 1121  . albuterol (PROVENTIL HFA;VENTOLIN HFA) 108 (90 BASE) MCG/ACT inhaler 2 puff  2 puff Inhalation Q6H PRN Laverle Hobby, PA-C      . alum & mag hydroxide-simeth (MAALOX/MYLANTA) 200-200-20 MG/5ML suspension 30 mL  30 mL Oral Q4H PRN Laverle Hobby, PA-C      . azelastine (ASTELIN) 0.1 % nasal spray 2 spray  2 spray Each Nare BID Laverle Hobby, PA-C   2 spray at 09/23/14 0804  . DULoxetine (CYMBALTA) DR capsule 60 mg  60 mg Oral BID Jenne Campus, MD   60 mg at  09/23/14 0803  . ibuprofen (ADVIL,MOTRIN) tablet 800 mg  800 mg Oral Q6H PRN Jenne Campus, MD   800 mg at 09/23/14 0807  . insulin aspart (novoLOG) injection 0-15 Units  0-15 Units Subcutaneous TID WC Laverle Hobby, PA-C   11 Units at 09/23/14 1148  . insulin aspart (novoLOG) injection 0-5 Units  0-5 Units Subcutaneous QHS Laverle Hobby, PA-C   4 Units at 09/22/14 2129  . insulin glargine (LANTUS) injection 25 Units  25 Units Subcutaneous QHS Jenne Campus, MD   25 Units at 09/22/14 2129  . lamoTRIgine (LAMICTAL) tablet  100 mg  100 mg Oral Daily Jenne Campus, MD   100 mg at 09/23/14 0804  . lamoTRIgine (LAMICTAL) tablet 75 mg  75 mg Oral QHS Myer Peer Cobos, MD      . loratadine (CLARITIN) tablet 10 mg  10 mg Oral Daily Shuvon Rankin, NP   10 mg at 09/23/14 0803  . magnesium hydroxide (MILK OF MAGNESIA) suspension 30 mL  30 mL Oral Daily PRN Shuvon Rankin, NP      . metFORMIN (GLUCOPHAGE) tablet 1,000 mg  1,000 mg Oral BID WC Laverle Hobby, PA-C   1,000 mg at 09/23/14 0803  . multivitamin with minerals tablet 1 tablet  1 tablet Oral Daily Laverle Hobby, PA-C   1 tablet at 09/23/14 3846  . nicotine (NICODERM CQ - dosed in mg/24 hours) patch 21 mg  21 mg Transdermal Daily Shuvon Rankin, NP   21 mg at 09/23/14 0802  . pioglitazone (ACTOS) tablet 30 mg  30 mg Oral Daily Shuvon Rankin, NP   30 mg at 09/23/14 0803  . QUEtiapine (SEROQUEL) tablet 25 mg  25 mg Oral TID PRN Jenne Campus, MD   25 mg at 09/23/14 0932  . simvastatin (ZOCOR) tablet 40 mg  40 mg Oral QHS Shuvon Rankin, NP   40 mg at 09/22/14 2129  . thiamine (VITAMIN B-1) tablet 100 mg  100 mg Oral Daily Laverle Hobby, PA-C   100 mg at 09/23/14 6599  . traZODone (DESYREL) tablet 100 mg  100 mg Oral QHS Shuvon Rankin, NP   100 mg at 09/22/14 2128    Lab Results:  Results for orders placed or performed during the hospital encounter of 09/19/14 (from the past 48 hour(s))  Glucose, capillary     Status: Abnormal   Collection Time: 09/21/14  4:26 PM  Result Value Ref Range   Glucose-Capillary 369 (H) 70 - 99 mg/dL  Glucose, capillary     Status: Abnormal   Collection Time: 09/21/14  8:38 PM  Result Value Ref Range   Glucose-Capillary 159 (H) 70 - 99 mg/dL  Glucose, capillary     Status: Abnormal   Collection Time: 09/22/14  5:49 AM  Result Value Ref Range   Glucose-Capillary 325 (H) 70 - 99 mg/dL  Glucose, capillary     Status: Abnormal   Collection Time: 09/22/14 12:08 PM  Result Value Ref Range   Glucose-Capillary 216 (H) 70 - 99 mg/dL    Comment 1 Notify RN   Glucose, capillary     Status: Abnormal   Collection Time: 09/22/14  5:14 PM  Result Value Ref Range   Glucose-Capillary 229 (H) 70 - 99 mg/dL   Comment 1 Notify RN   Glucose, capillary     Status: Abnormal   Collection Time: 09/22/14  9:10 PM  Result Value Ref Range   Glucose-Capillary 310 (H) 70 - 99  mg/dL  Glucose, capillary     Status: Abnormal   Collection Time: 09/23/14  6:12 AM  Result Value Ref Range   Glucose-Capillary 221 (H) 70 - 99 mg/dL  Glucose, capillary     Status: Abnormal   Collection Time: 09/23/14 11:45 AM  Result Value Ref Range   Glucose-Capillary 307 (H) 70 - 99 mg/dL   Comment 1 Notify RN     Physical Findings: AIMS: Facial and Oral Movements Muscles of Facial Expression: None, normal Lips and Perioral Area: None, normal Jaw: None, normal Tongue: None, normal,Extremity Movements Upper (arms, wrists, hands, fingers): None, normal Lower (legs, knees, ankles, toes): None, normal, Trunk Movements Neck, shoulders, hips: None, normal, Overall Severity Severity of abnormal movements (highest score from questions above): None, normal Incapacitation due to abnormal movements: None, normal Patient's awareness of abnormal movements (rate only patient's report): No Awareness, Dental Status Current problems with teeth and/or dentures?: No Does patient usually wear dentures?: No  CIWA:  CIWA-Ar Total: 5 COWS:  COWS Total Score: 6   Assessment:  Still depressed , anxious, ruminative. No SI.  Intermittently having vague homicidal ideations towards wife. Although denies plan or intention of acting out on these today,  Thoughts have been persistent/intermittent and treatment team has agreed that duty to warn needs to be implemented.  Will try Seroquel PRNS for anxiety symptoms. We discussed rationale and side effect profile and patient agrees  Treatment Plan Summary: Daily contact with patient to assess and evaluate symptoms and progress in  treatment Medication management See below  Plan: Continue inpatient treatment, milieu, support Continue Librium detox protocol Increase Seroquel 25 mg QID PRN Anxiety Continue Cymbalta  At 60 mgrs BID   Lamictal 100 mg QAM and 75 mgrs QHS Ibuprofen to 800 mgrs QID  Reviewed glucose level today 307 Patient states he was on higher dose of Lantus at home ( 30 QDAY).Serum glucose tends to be elevated - will increase to 25 U [place] QHS.   Medical Decision Making Problem Points:  Established problem, worsening (2), Review of last therapy session (1) and Review of psycho-social stressors (1) Data Points:  Review or order clinical lab tests (1) Review of medication regiment & side effects (2) Review of new medications or change in dosage (2)  I certify that inpatient services furnished can reasonably be expected to improve the patient's condition.   Kayzen Kendzierski, [place] 09/23/2014, 12:01 PM

## 2014-09-23 NOTE — Progress Notes (Signed)
D: Patient in bed on approach.  Patient states he had a good day but states his blood sugars have been high today.  Patient states he cannot remember his goal for today and states he cannot remember if he learned anything in group.  Patient denies SI/HI and denies AVH.  Patient states he had been mindful of his diet and trying to make better food choices. A: Staff to monitor Q 15 mins for safety.  Encouragement and support offered.  Scheduled medications administered per orders.  Seroquel administered prn for anxiety.   R: Patient remains safe on the unit.  Patient attended group tonight.  Patient taking administered medications.  Patient visible on the unit and interacting with peers.

## 2014-09-23 NOTE — [facility] Group Notes (Signed)
[facility] LCSW Group Therapy  09/23/2014 12:53 PM  Type of Therapy:  Group Therapy  Participation Level:  Active  Participation Quality:  Appropriate and Supportive  Affect:  Flat  Cognitive:  Appropriate  Insight:  Developing/Improving, Engaged and Supportive  Engagement in Therapy:  Developing/Improving, Engaged and Supportive  Modes of Intervention:  Discussion, Education, Exploration, Rapport Building and Support  Summary of Progress/Problems: Pt was able to speak about support networks during group, however pt did not share for long.  Pt expressed understanding of support networks, positive/negative supports, as well as professional and personal supports.   Seabron SpatesVaughn, Isayah Ignasiak Anne 09/23/2014, 12:53 PM

## 2014-09-24 LAB — GLUCOSE, CAPILLARY
Glucose-Capillary: 249 mg/dL — ABNORMAL HIGH (ref 70–99)
Glucose-Capillary: 318 mg/dL — ABNORMAL HIGH (ref 70–99)
Glucose-Capillary: 235 mg/dL — ABNORMAL HIGH (ref 70–99)
Glucose-Capillary: 268 mg/dL — ABNORMAL HIGH (ref 70–99)
Glucose-Capillary: 243 mg/dL — ABNORMAL HIGH (ref 70–99)

## 2014-09-24 MED ORDER — INSULIN GLARGINE 100 UNIT/ML ~~LOC~~ SOLN
30.0000 [IU] | Freq: Every day | SUBCUTANEOUS | Status: DC
  Administered 2014-09-24 – 2014-09-26 (×3): 30 [IU] via SUBCUTANEOUS

## 2014-09-24 NOTE — [facility] Group Notes (Signed)
[facility] LCSW Group Therapy  09/24/2014 10:00 AM   Type of Therapy:  Group Therapy  Participation Level:  Did Not Attend  Madalaine Portier Horton, LCSW 09/24/2014 10:41 AM     

## 2014-09-24 NOTE — Progress Notes (Signed)
Patient ID: [name], male   DOB: [birth year], 38 y.o.   MRN: [id number] [facility] MD Progress Note  09/24/2014 10:26 AM [name]  MRN:  [id number] Subjective: I feel better today and not so in my head  Objective: I have reviewed the chart and have met with patient. He has been attending groups. No disruptive behaviors on the unit.   Pt has been taking Seroquel QID and it was effective. It helped to mellow him out and calm him down.   Anxiety is much better and more tolerable with Seroquel. He is not focused on the negative now.   Depression is getting a little better today. Level is 5/10. He slept well last night. Energy remains low.   Denies SI and HI today.  Denies SE from meds.   Withdrawal is better- tremors and sweats continue.    Diagnosis:  Bipolar Disorder Depressed versus Substance Induced Mood Disorder Depressed. Alcohol Dependence, Cocaine Dependence  Total Time spent with patient: 25 minutes   ADL's:  Improved   Sleep: improved but he is having a lot of dreams  Appetite: increased  Suicidal Ideation:  Denies any suicidal ideations Homicidal Ideation:  Denies any homicidal ideations at this time AEB (as evidenced by):  Psychiatric Specialty Exam: Physical Exam  Review of Systems  Constitutional: Negative for fever and chills.  Respiratory: Negative for cough and shortness of breath.   Cardiovascular: Negative for chest pain.  Gastrointestinal: Negative for vomiting and blood in stool.  Musculoskeletal: Positive for joint pain and neck pain.  Neurological: Positive for headaches.  Psychiatric/Behavioral: Positive for depression and substance abuse. The patient is nervous/anxious.     Blood pressure 91/74, pulse 126, temperature 97.6 F (36.4 C), temperature source Oral, resp. rate 18, height _0  (1.803 m), weight 79.833 kg (176 lb).Body mass index is 24.56 kg/(m^2).  General Appearance: Fairly Groomed  Engineer, water::  Good  Speech:  Normal  Rate  Volume:  Normal  Mood:  Depressed  Affect:  Congruent but brighter today as compared to yesterday  Thought Process:  Linear  Orientation:  Other:  fully alert and attentive  Thought Content:  No hallucinations, no delusions  Suicidal Thoughts:  No  at this time denies any thoughts of hurting self and  contracts for safety on unit   Homicidal Thoughts:  Today denies HI  Memory:  Recent and Remote grossly intact  Judgement:  Fair  Insight:  Fair  Psychomotor Activity:  Normal  Concentration:  Good  Recall:  Good  Fund of Knowledge:Good  Language: Good  Akathisia:  Negative  Handed:  Right  AIMS (if indicated):     Assets:  Desire for Improvement Resilience  Sleep:  Number of Hours: 6   Musculoskeletal: Strength & Muscle Tone: within normal limits Gait & Station: normal Patient leans: N/A  Current Medications: Current Facility-Administered Medications  Medication Dose Route Frequency Provider Last Rate Last Dose  . acetaminophen (TYLENOL) tablet 650 mg  650 mg Oral Q6H PRN Laverle Hobby, PA-C   650 mg at 09/23/14 2000  . albuterol (PROVENTIL HFA;VENTOLIN HFA) 108 (90 BASE) MCG/ACT inhaler 2 puff  2 puff Inhalation Q6H PRN Laverle Hobby, PA-C      . alum & mag hydroxide-simeth (MAALOX/MYLANTA) 200-200-20 MG/5ML suspension 30 mL  30 mL Oral Q4H PRN Laverle Hobby, PA-C      . azelastine (ASTELIN) 0.1 % nasal spray 2 spray  2 spray Each Nare BID Laverle Hobby, PA-C  2 spray at 09/24/14 0837  . DULoxetine (CYMBALTA) DR capsule 60 mg  60 mg Oral BID Jenne Campus, MD   60 mg at 09/24/14 4270  . ibuprofen (ADVIL,MOTRIN) tablet 800 mg  800 mg Oral Q6H PRN Jenne Campus, MD   800 mg at 09/24/14 0841  . insulin aspart (novoLOG) injection 0-15 Units  0-15 Units Subcutaneous TID WC Laverle Hobby, PA-C   5 Units at 09/24/14 [phone]  . insulin aspart (novoLOG) injection 0-5 Units  0-5 Units Subcutaneous QHS Laverle Hobby, PA-C   4 Units at 09/22/14 2129  . insulin  glargine (LANTUS) injection 25 Units  25 Units Subcutaneous QHS Jenne Campus, MD   25 Units at 09/23/14 2143  . lamoTRIgine (LAMICTAL) tablet 100 mg  100 mg Oral Daily Jenne Campus, MD   100 mg at 09/24/14 6283  . lamoTRIgine (LAMICTAL) tablet 75 mg  75 mg Oral QHS Jenne Campus, MD   75 mg at 09/23/14 2142  . loratadine (CLARITIN) tablet 10 mg  10 mg Oral Daily Shuvon Rankin, NP   10 mg at 09/24/14 0839  . magnesium hydroxide (MILK OF MAGNESIA) suspension 30 mL  30 mL Oral Daily PRN Shuvon Rankin, NP      . metFORMIN (GLUCOPHAGE) tablet 1,000 mg  1,000 mg Oral BID WC Laverle Hobby, PA-C   1,000 mg at 09/24/14 1517  . multivitamin with minerals tablet 1 tablet  1 tablet Oral Daily Laverle Hobby, PA-C   1 tablet at 09/24/14 [phone]  . nicotine (NICODERM CQ - dosed in mg/24 hours) patch 21 mg  21 mg Transdermal Daily Shuvon Rankin, NP   21 mg at 09/24/14 0837  . pioglitazone (ACTOS) tablet 30 mg  30 mg Oral Daily Shuvon Rankin, NP   30 mg at 09/24/14 0838  . QUEtiapine (SEROQUEL) tablet 25 mg  25 mg Oral QID PRN Charlcie Cradle, MD   25 mg at 09/24/14 0841  . simvastatin (ZOCOR) tablet 40 mg  40 mg Oral QHS Shuvon Rankin, NP   40 mg at 09/23/14 2141  . thiamine (VITAMIN B-1) tablet 100 mg  100 mg Oral Daily Laverle Hobby, PA-C   100 mg at 09/24/14 7371  . traZODone (DESYREL) tablet 100 mg  100 mg Oral QHS Shuvon Rankin, NP   100 mg at 09/23/14 2141    Lab Results:  Results for orders placed or performed during the hospital encounter of 09/19/14 (from the past 48 hour(s))  Glucose, capillary     Status: Abnormal   Collection Time: 09/22/14 12:08 PM  Result Value Ref Range   Glucose-Capillary 216 (H) 70 - 99 mg/dL   Comment 1 Notify RN   Glucose, capillary     Status: Abnormal   Collection Time: 09/22/14  5:14 PM  Result Value Ref Range   Glucose-Capillary 229 (H) 70 - 99 mg/dL   Comment 1 Notify RN   Glucose, capillary     Status: Abnormal   Collection Time: 09/22/14  9:10 PM   Result Value Ref Range   Glucose-Capillary 310 (H) 70 - 99 mg/dL  Glucose, capillary     Status: Abnormal   Collection Time: 09/23/14  6:12 AM  Result Value Ref Range   Glucose-Capillary 221 (H) 70 - 99 mg/dL  Glucose, capillary     Status: Abnormal   Collection Time: 09/23/14 11:45 AM  Result Value Ref Range   Glucose-Capillary 307 (H) 70 - 99 mg/dL  Comment 1 Notify RN   Glucose, capillary     Status: Abnormal   Collection Time: 09/23/14  4:51 PM  Result Value Ref Range   Glucose-Capillary 469 (H) 70 - 99 mg/dL   Comment 1 Notify RN   Glucose, capillary     Status: Abnormal   Collection Time: 09/23/14  6:18 PM  Result Value Ref Range   Glucose-Capillary 253 (H) 70 - 99 mg/dL  Glucose, capillary     Status: Abnormal   Collection Time: 09/23/14  8:33 PM  Result Value Ref Range   Glucose-Capillary 128 (H) 70 - 99 mg/dL  Glucose, capillary     Status: Abnormal   Collection Time: 09/24/14  6:03 AM  Result Value Ref Range   Glucose-Capillary 249 (H) 70 - 99 mg/dL    Physical Findings: AIMS: Facial and Oral Movements Muscles of Facial Expression: None, normal Lips and Perioral Area: None, normal Jaw: None, normal Tongue: None, normal,Extremity Movements Upper (arms, wrists, hands, fingers): None, normal Lower (legs, knees, ankles, toes): None, normal, Trunk Movements Neck, shoulders, hips: None, normal, Overall Severity Severity of abnormal movements (highest score from questions above): None, normal Incapacitation due to abnormal movements: None, normal Patient's awareness of abnormal movements (rate only patient's report): No Awareness, Dental Status Current problems with teeth and/or dentures?: No Does patient usually wear dentures?: No  CIWA:  CIWA-Ar Total: 0 COWS:  COWS Total Score: 6   Assessment:  Depression and anxiety improving. Tolerating Librium detox well.   Treatment Plan Summary: Daily contact with patient to assess and evaluate symptoms and progress in  treatment Medication management See below  Plan: Continue inpatient treatment, milieu, support Continue Librium detox protocol Seroquel 25 mg QID PRN Anxiety Continue Cymbalta  At 60 mgrs BID   Lamictal 100 mg QAM and 75 mgrs QHS Ibuprofen to 800 mgrs QID  Reviewed glucose level today 249 Patient states he was on higher dose of Lantus at home ( 30 QDAY).Serum glucose tends to be elevated - will increase to 30U [place] QHS.   Medical Decision Making Problem Points:  Established problem, stable/improving (1), Review of last therapy session (1) and Review of psycho-social stressors (1) Data Points:  Review or order clinical lab tests (1) Review of medication regiment & side effects (2) Review of new medications or change in dosage (2)  I certify that inpatient services furnished can reasonably be expected to improve the patient's condition.   Kade Rickels 09/24/2014, 10:26 AM

## 2014-09-24 NOTE — Progress Notes (Signed)
Pt states he slept fair last pm. No S/I, H/I. Conversant and cooperative.  States depression 5 and anxiety . Goal for today is "not be so depressed"  Will continue to monitor.

## 2014-09-24 NOTE — Progress Notes (Signed)
Pt attended the AA speaker meeting this evening. Pt was engaged and alert. Affect was appropriate.

## 2014-09-25 ENCOUNTER — Ambulatory Visit: Payer: PRIVATE HEALTH INSURANCE | Admitting: Licensed Clinical Social Worker

## 2014-09-25 LAB — GLUCOSE, CAPILLARY
GLUCOSE-CAPILLARY: 219 mg/dL — AB (ref 70–99)
GLUCOSE-CAPILLARY: 409 mg/dL — AB (ref 70–99)
Glucose-Capillary: 316 mg/dL — ABNORMAL HIGH (ref 70–99)
Glucose-Capillary: 362 mg/dL — ABNORMAL HIGH (ref 70–99)
Glucose-Capillary: 293 mg/dL — ABNORMAL HIGH (ref 70–99)

## 2014-09-25 MED ORDER — QUETIAPINE FUMARATE 50 MG PO TABS
50.0000 mg | ORAL_TABLET | Freq: Three times a day (TID) | ORAL | Status: DC | PRN
  Administered 2014-09-25 – 2014-09-26 (×2): 50 mg via ORAL
  Filled 2014-09-25: qty 1

## 2014-09-25 MED ORDER — QUETIAPINE FUMARATE 50 MG PO TABS
50.0000 mg | ORAL_TABLET | Freq: Once | ORAL | Status: AC
  Administered 2014-09-25: 50 mg via ORAL
  Filled 2014-09-25 (×2): qty 1

## 2014-09-25 NOTE — Progress Notes (Signed)
D: Patient presents with extremely flat, depressed affect and mood. He reported on the self inventory sheet that he has poor sleep and ability to concentrate, good appetite and low energy level. Today, patient rates depression/feelings of hopelessness "8" and anxiety "7". His goal is to try to stop worrying about the future. Patient is compliant with the current medication regimen.  A: Support and encouragement provided to patient. Scheduled medications administered per MD orders. Maintain Q15 minute checks for safety.  R: Patient receptive. Endorses passive SI, but contracts for safety. Denies HI and auditory/visual hallucinations. Patient remains safe on the unit.

## 2014-09-25 NOTE — Progress Notes (Addendum)
D Pt. Denies SI and HI,  Complained of pain which he received ibuprofen to relieve and seroquel was adm for his anxiety.  A Writer offered support and encouragement.   Discussed coping skills with pt. And practiced deep breathing to help him relax. CBG was 243 this HS.  R Pt. Remains safe on the unit, continues to report he does not feel like the seroquel is lasting to relieve his anxiety between PRN doses/. Pt received 2 units of novolog.

## 2014-09-25 NOTE — Clinical Social Work Note (Signed)
CS[facility] regarding rescheduling therapy appointment with Berniece AndreasJulie Whitt scheduled for today 11/16.  Samuella BruinKristin Kiowa Hollar, MSW, [employer] Clinical Social Worker [facility] [phone]

## 2014-09-25 NOTE — Progress Notes (Signed)
Inpatient Diabetes Program Recommendations  AACE/ADA: New Consensus Statement on Inpatient Glycemic Control (2013)  Target Ranges:  Prepandial:   less than 140 mg/dL      Peak postprandial:   less than 180 mg/dL (1-2 hours)      Critically ill patients:  140 - 180 mg/dL   Reason for Visit: Diabetes Consult  Diabetes history: DM2 Outpatient Diabetes medications: Lantus 20 units QHS, Actos 30 mg QD, metformin 1000 mg bid Current orders for Inpatient glycemic control: Lantus 30 units QHS, Actos 30 mg QD, metformin 1000 mg bid, and Novolog moderate tidwc and hs.  Results for [name], [name] (MRN [id number]) as of 09/25/2014 16:53  Ref. Range 09/24/2014 21:21 09/25/2014 05:51 09/25/2014 11:14 09/25/2014 13:03 09/25/2014 16:42  Glucose-Capillary Latest Range: 70-99 mg/dL 409243 (H) 811219 (H) 914409 (H) 316 (H) 362 (H)  Results for [name], [name] (MRN [id number]) as of 09/25/2014 16:53  Ref. Range 08/17/2014 09:30  Hgb A1c MFr Bld Latest Range: 4.6-6.5 % 8.5 (H)   Uncontrolled blood sugars.  Inpatient Diabetes Program Recommendations Insulin - Meal Coverage: Add Novolog 6 units tidwc for meal coverage insulin.  HgbA1C - Need updated HgbA1C to assess glycemic control prior to hospitalization  Note: Will follow up in am. Thank you. Ailene Ardshonda Goldie Tregoning, RD, LDN, CDE Inpatient Diabetes Coordinator [phone]

## 2014-09-25 NOTE — [facility] Suicide Risk Assessment (Signed)
[facility] INPATIENT:  Family/Significant Other Suicide Prevention Education  Suicide Prevention Education:  Contact Attempts: father Hanley SeamenJeff Glasner [phone] (name of family member/significant other) has been identified by the patient as the family member/significant other with whom the patient will be residing, and identified as the person(s) who will aid the patient in the event of a mental health crisis.  With written consent from the patient, two attempts were made to provide suicide prevention education, prior to and/or following the patient's discharge.  We were unsuccessful in providing suicide prevention education.  A suicide education pamphlet was given to the patient to share with family/significant other.  Date and time of first attempt: 09/20/14 at 3:30 pm Date and time of second attempt:11/12 at 3:30 pm Third attempt: 09/25/14 at 10:10 am  Quatisha Zylka, Belenda CruiseKristin L 09/25/2014, 10:12 AM

## 2014-09-25 NOTE — Progress Notes (Signed)
Adult Psychoeducational Group Note  Date:  [name] Time:  10:0am Group Topic/Focus:  Wellness Toolbox:   The focus of this group is to discuss various aspects of wellness, balancing those aspects and exploring ways to increase the ability to experience wellness.  Patients will create a wellness toolbox for use upon discharge.  Participation Level:  Active  Participation Quality:  Appropriate and Attentive  Affect:  Appropriate  Cognitive:  Alert and Appropriate  Insight: Appropriate  Engagement in Group:  Engaged  Modes of Intervention:  Discussion and Education  Additional Comments:   Pt attended and participated in group. Question was asked What is the one thing you wish people understood about you and What is your best quality? Pt stated I wish people would understand my mental illness and my one good quality is I love to electrical work and I think I am pretty good at it.  Shelly BombardGarner, [name], 3:48 PM

## 2014-09-25 NOTE — [facility] Group Notes (Signed)
[facility] LCSW Group Therapy 09/25/2014  1:15 pm  Type of Therapy: Group Therapy Participation Level: Active  Participation Quality: Attentive, Sharing and Supportive  Affect: Depressed and Flat  Cognitive: Alert and Oriented  Insight: Developing/Improving and Engaged  Engagement in Therapy: Developing/Improving and Engaged  Modes of Intervention: Clarification, Confrontation, Discussion, Education, Exploration,  Limit-setting, Orientation, Problem-solving, Rapport Building, Dance movement psychotherapisteality Testing, Socialization and Support  Summary of Progress/Problems: Pt identified obstacles faced currently and processed barriers involved in overcoming these obstacles. Pt identified steps necessary for overcoming these obstacles and explored motivation (internal and external) for facing these difficulties head on. Pt further identified one area of concern in their lives and chose a goal to focus on for today. Patient identified his obstacles as staying in the moment, dealing with loneliness, and dealing with his anger. CSW's and other group members provided emotional support and encouragement.  [name], MSW, [employer] Clinical Social Worker [facility] [phone]

## 2014-09-25 NOTE — Progress Notes (Addendum)
Patient ID: ARRINGTON YOHE, male   DOB: 24-Feb-1976, 38 y.o.   MRN: [id number] [facility] MD Progress Note  09/25/2014 2:41 PM ESAIAS CLEAVENGER  MRN:  [id number] Subjective: Patient describes partial improvement but states he remains depressed and anxious.  Objective: I have discussed case with treatment team and have met with patient. Patient states that he remains depressed, but at this time is denying any plan or intention of hurting himself and states he no longer has any homicidal ideations . He denies any ongoing thoughts of hurting his wife. He has been on Seroquel PRNs for anxiety/agitation and feels these have worked well but partially. He has not had any side effects, and does not feel this medication has caused excessive sedation. No akathisia. He is hoping to titrate dose further to continue to address residual anxiety. Chart notes indicate that patient remains anxious and depressed. No disruptive behaviors on unit- going to groups/visible in day room. Does not endorse medication side effects at present.   Diagnosis:  Bipolar Disorder Depressed versus Substance Induced Mood Disorder Depressed. Alcohol Dependence, Cocaine Dependence  Total Time spent with patient: 25 minutes   ADL's:  Improved   Sleep: improved but he is having a lot of dreams  Appetite: increased  Suicidal Ideation:  Denies any suicidal ideations Homicidal Ideation:  Denies any homicidal ideations at this time AEB (as evidenced by):  Psychiatric Specialty Exam: Physical Exam  Review of Systems  Constitutional: Negative for fever and chills.  Respiratory: Negative for cough and shortness of breath.   Cardiovascular: Negative for chest pain.  Gastrointestinal: Negative for vomiting and blood in stool.  Musculoskeletal: Positive for joint pain and neck pain.  Neurological: Positive for headaches.  Psychiatric/Behavioral: Positive for depression and substance abuse. The patient is nervous/anxious.      Blood pressure 121/82, pulse 101, temperature 97.4 F (36.3 C), temperature source Oral, resp. rate 18, height 5' 11"  (1.803 m), weight 79.833 kg (176 lb).Body mass index is 24.56 kg/(m^2).  General Appearance: improved grooming   Eye Contact::  Good  Speech:  Normal Rate  Volume:  Normal  Mood:  Depressed  Affect:  remains constricted and somewhat anxious, but improved compared to admission  Thought Process:  Linear  Orientation:  Other:  fully alert and attentive  Thought Content:  No hallucinations, no delusions  Suicidal Thoughts:  No  at this time denies any thoughts of hurting self and  contracts for safety on unit   Homicidal Thoughts:  Today denies HI  Memory:  Recent and Remote grossly intact  Judgement:  Fair  Insight:  Fair  Psychomotor Activity:  Normal- slightly " fidgety" related to anxiety  Concentration:  Good  Recall:  Good  Fund of Knowledge:Good  Language: Good  Akathisia:  Negative  Handed:  Right  AIMS (if indicated):     Assets:  Desire for Improvement Resilience  Sleep:  Number of Hours: 6   Musculoskeletal: Strength & Muscle Tone: within normal limits Gait & Station: normal Patient leans: N/A  Current Medications: Current Facility-Administered Medications  Medication Dose Route Frequency Provider Last Rate Last Dose  . acetaminophen (TYLENOL) tablet 650 mg  650 mg Oral Q6H PRN Laverle Hobby, PA-C   650 mg at 09/25/14 1157  . albuterol (PROVENTIL HFA;VENTOLIN HFA) 108 (90 BASE) MCG/ACT inhaler 2 puff  2 puff Inhalation Q6H PRN Laverle Hobby, PA-C      . alum & mag hydroxide-simeth (MAALOX/MYLANTA) 200-200-20 MG/5ML suspension 30 mL  30  mL Oral Q4H PRN Laverle Hobby, PA-C      . azelastine (ASTELIN) 0.1 % nasal spray 2 spray  2 spray Each Nare BID Laverle Hobby, PA-C   2 spray at 09/25/14 0802  . DULoxetine (CYMBALTA) DR capsule 60 mg  60 mg Oral BID Jenne Campus, MD   60 mg at 09/25/14 0802  . ibuprofen (ADVIL,MOTRIN) tablet 800 mg  800 mg  Oral Q6H PRN Jenne Campus, MD   800 mg at 09/25/14 0554  . insulin aspart (novoLOG) injection 0-15 Units  0-15 Units Subcutaneous TID WC Laverle Hobby, PA-C   15 Units at 09/25/14 1159  . insulin aspart (novoLOG) injection 0-5 Units  0-5 Units Subcutaneous QHS Laverle Hobby, PA-C   2 Units at 09/24/14 2153  . insulin glargine (LANTUS) injection 30 Units  30 Units Subcutaneous QHS Charlcie Cradle, MD   30 Units at 09/24/14 2152  . lamoTRIgine (LAMICTAL) tablet 100 mg  100 mg Oral Daily Jenne Campus, MD   100 mg at 09/25/14 0802  . lamoTRIgine (LAMICTAL) tablet 75 mg  75 mg Oral QHS Jenne Campus, MD   75 mg at 09/24/14 2153  . loratadine (CLARITIN) tablet 10 mg  10 mg Oral Daily Shuvon Rankin, NP   10 mg at 09/25/14 0802  . magnesium hydroxide (MILK OF MAGNESIA) suspension 30 mL  30 mL Oral Daily PRN Shuvon Rankin, NP      . metFORMIN (GLUCOPHAGE) tablet 1,000 mg  1,000 mg Oral BID WC Laverle Hobby, PA-C   1,000 mg at 09/25/14 0802  . multivitamin with minerals tablet 1 tablet  1 tablet Oral Daily Laverle Hobby, PA-C   1 tablet at 09/25/14 0802  . nicotine (NICODERM CQ - dosed in mg/24 hours) patch 21 mg  21 mg Transdermal Daily Shuvon Rankin, NP   21 mg at 09/25/14 0801  . pioglitazone (ACTOS) tablet 30 mg  30 mg Oral Daily Shuvon Rankin, NP   30 mg at 09/25/14 0802  . QUEtiapine (SEROQUEL) tablet 50 mg  50 mg Oral TID PRN Jenne Campus, MD      . simvastatin (ZOCOR) tablet 40 mg  40 mg Oral QHS Shuvon Rankin, NP   40 mg at 09/24/14 2153  . thiamine (VITAMIN B-1) tablet 100 mg  100 mg Oral Daily Laverle Hobby, PA-C   100 mg at 09/25/14 8413  . traZODone (DESYREL) tablet 100 mg  100 mg Oral QHS Shuvon Rankin, NP   100 mg at 09/24/14 2153    Lab Results:  Results for orders placed or performed during the hospital encounter of 09/19/14 (from the past 48 hour(s))  Glucose, capillary     Status: Abnormal   Collection Time: 09/23/14  4:51 PM  Result Value Ref Range    Glucose-Capillary 469 (H) 70 - 99 mg/dL   Comment 1 Notify RN   Glucose, capillary     Status: Abnormal   Collection Time: 09/23/14  6:18 PM  Result Value Ref Range   Glucose-Capillary 253 (H) 70 - 99 mg/dL  Glucose, capillary     Status: Abnormal   Collection Time: 09/23/14  8:33 PM  Result Value Ref Range   Glucose-Capillary 128 (H) 70 - 99 mg/dL  Glucose, capillary     Status: Abnormal   Collection Time: 09/24/14  6:03 AM  Result Value Ref Range   Glucose-Capillary 249 (H) 70 - 99 mg/dL  Glucose, capillary  Status: Abnormal   Collection Time: 09/24/14 11:52 AM  Result Value Ref Range   Glucose-Capillary 318 (H) 70 - 99 mg/dL   Comment 1 Notify RN   Glucose, capillary     Status: Abnormal   Collection Time: 09/24/14  2:33 PM  Result Value Ref Range   Glucose-Capillary 235 (H) 70 - 99 mg/dL  Glucose, capillary     Status: Abnormal   Collection Time: 09/24/14  4:52 PM  Result Value Ref Range   Glucose-Capillary 268 (H) 70 - 99 mg/dL  Glucose, capillary     Status: Abnormal   Collection Time: 09/24/14  9:21 PM  Result Value Ref Range   Glucose-Capillary 243 (H) 70 - 99 mg/dL  Glucose, capillary     Status: Abnormal   Collection Time: 09/25/14  5:51 AM  Result Value Ref Range   Glucose-Capillary 219 (H) 70 - 99 mg/dL  Glucose, capillary     Status: Abnormal   Collection Time: 09/25/14 11:14 AM  Result Value Ref Range   Glucose-Capillary 409 (H) 70 - 99 mg/dL   Comment 1 Notify RN   Glucose, capillary     Status: Abnormal   Collection Time: 09/25/14  1:03 PM  Result Value Ref Range   Glucose-Capillary 316 (H) 70 - 99 mg/dL   Comment 1 Notify RN     Physical Findings: AIMS: Facial and Oral Movements Muscles of Facial Expression: None, normal Lips and Perioral Area: None, normal Jaw: None, normal Tongue: None, normal,Extremity Movements Upper (arms, wrists, hands, fingers): None, normal Lower (legs, knees, ankles, toes): None, normal, Trunk Movements Neck,  shoulders, hips: None, normal, Overall Severity Severity of abnormal movements (highest score from questions above): None, normal Incapacitation due to abnormal movements: None, normal Patient's awareness of abnormal movements (rate only patient's report): No Awareness, Dental Status Current problems with teeth and/or dentures?: No Does patient usually wear dentures?: No  CIWA:  CIWA-Ar Total: 6 COWS:  COWS Total Score: 6   Assessment:  Patient remains depressed and anxious, but is improved compared to admission. He is no longer homicidal and is expressly denying any current thoughts of violence directed at his wife. His behavior is in control/not disruptive. He is tolerating medications well- Seroquel partially helpful to address anxiety, which has been significant .  Treatment Plan Summary: Daily contact with patient to assess and evaluate symptoms and progress in treatment Medication management See below  Plan: Continue inpatient treatment, milieu, support Continue Librium detox protocol Increase Seroquel to 50 mgrs Q 8 Hours PRN Anxiety- we reviewed side effects Cymbalta  At 60 mgrs BID  Lamictal 100 mg QAM and 75 mgrs QHS Ibuprofen to 800 mgrs QID  Trazodone 100 mgrs QHS  PRN Insomnia Lantus insulin was increased to 30U [place] QHS. As glycemic control not optimal at this time, have ordered a Diabetic N.  Consultation   Medical Decision Making Problem Points:  Established problem, stable/improving (1), Review of last therapy session (1) and Review of psycho-social stressors (1) Data Points:  Review or order clinical lab tests (1) Review of medication regiment & side effects (2) Review of new medications or change in dosage (2)  I certify that inpatient services furnished can reasonably be expected to improve the patient's condition.   Alcides Nutting, [place] 09/25/2014, 2:41 PM

## 2014-09-25 NOTE — [facility] Group Notes (Signed)
[facility]   09/25/2014 10:20 AM    Participation Quality:  Appropraite  Mood/Affect:  Appropriate  Depression Rating:  8  Anxiety Rating:  8  Thoughts of Suicide:  Yes  Will you contract for safety?   Yes  Current AVH:  No  Plan for Discharge/Comments:  Patient attended discharge planning group and actively participated in group. He continues to endorses off/on SI but able to contract for safety.  Patient and his CSW are working on discharge plans. Suicide prevention education reviewed and SPE document provided.   Transportation Means: Patient has transportation.   Supports:  Patient has a support system.   Lakendrick Paradis, Joesph JulyQuylle Hairston

## 2014-09-26 LAB — GLUCOSE, CAPILLARY
GLUCOSE-CAPILLARY: 216 mg/dL — AB (ref 70–99)
GLUCOSE-CAPILLARY: 254 mg/dL — AB (ref 70–99)
GLUCOSE-CAPILLARY: 236 mg/dL — AB (ref 70–99)
Glucose-Capillary: 327 mg/dL — ABNORMAL HIGH (ref 70–99)

## 2014-09-26 MED ORDER — QUETIAPINE FUMARATE 25 MG PO TABS
25.0000 mg | ORAL_TABLET | Freq: Three times a day (TID) | ORAL | Status: DC | PRN
  Administered 2014-09-26 – 2014-09-27 (×3): 25 mg via ORAL
  Filled 2014-09-26: qty 1
  Filled 2014-09-26: qty 6
  Filled 2014-09-26 (×2): qty 1

## 2014-09-26 MED ORDER — QUETIAPINE FUMARATE 100 MG PO TABS
100.0000 mg | ORAL_TABLET | Freq: Every day | ORAL | Status: DC
Start: 2014-09-26 — End: 2014-09-27
  Administered 2014-09-26: 100 mg via ORAL
  Filled 2014-09-26 (×2): qty 1
  Filled 2014-09-26: qty 3
  Filled 2014-09-26: qty 1

## 2014-09-26 NOTE — [facility] Suicide Risk Assessment (Signed)
[facility] INPATIENT:  Family/Significant Other Suicide Prevention Education  Suicide Prevention Education:  Education Completed; son Timothy PicaCody [name] [phone],  (name of family member/significant other) has been identified by the patient as the family member/significant other with whom the patient will be residing, and identified as the person(s) who will aid the patient in the event of a mental health crisis (suicidal ideations/suicide attempt).  With written consent from the patient, the family member/significant other has been provided the following suicide prevention education, prior to the and/or following the discharge of the patient.  The suicide prevention education provided includes the following:  Suicide risk factors  Suicide prevention and interventions  National Suicide Hotline telephone number  [facility] assessment telephone number  [facility] Emergency Assistance 911  [facility] and/or Residential Mobile Crisis Unit telephone number  Request made of family/significant other to:  Remove weapons (e.g., guns, rifles, knives), all items previously/currently identified as safety concern.    Remove drugs/medications (over-the-counter, prescriptions, illicit drugs), all items previously/currently identified as a safety concern.  The family member/significant other verbalizes understanding of the suicide prevention education information provided.  The family member/significant other agrees to remove the items of safety concern listed above.  Chanin Frumkin, [place] CarboKristin L 09/26/2014, 1:13 PM

## 2014-09-26 NOTE — [facility] Group Notes (Signed)
[facility] LCSW Group Therapy  09/26/2014   1:15 PM   Type of Therapy:  Group Therapy  Participation Level:  Active  Participation Quality:  Attentive, Sharing and Supportive  Affect:  Depressed and Flat  Cognitive:  Alert and Oriented  Insight:  Developing/Improving and Engaged  Engagement in Therapy:  Developing/Improving and Engaged  Modes of Intervention:  Clarification, Confrontation, Discussion, Education, Exploration, Limit-setting, Orientation, Problem-solving, Rapport Building, Dance movement psychotherapisteality Testing, Socialization and Support  Summary of Progress/Problems: The topic for group therapy was feelings about diagnosis.  Pt actively participated in group discussion on their past and current diagnosis and how they feel towards this.  Pt also identified how society and family members judge them, based on their diagnosis as well as stereotypes and stigmas.  Patient reports that his medications have helped him to manage his bipolar symptoms. CSW's and other group members provided emotional support and encouragement.  Samuella BruinKristin Eveleen Mcnear, MSW, [employer]CSWA Clinical Social Worker [facility] [phone]

## 2014-09-26 NOTE — [facility] Group Notes (Signed)
[facility] Group Notes:  stress  Date:  09/26/2014  Time:  9:28 AM  Type of Therapy:  Nurse Education  Participation Level:  Active  Participation Quality:  Appropriate  Affect:  Appropriate  Cognitive:  Appropriate  Insight:  Appropriate  Engagement in Group:  Engaged  Modes of Intervention:  Discussion  Summary of Progress/Problems:pt did attend  Rodman KeyWebb, Kathrine Rieves [facility] 09/26/2014, 9:28 AM

## 2014-09-26 NOTE — Plan of Care (Signed)
Problem: Alteration in mood & ability to function due to Goal: LTG-Pt verbalizes understanding of importance of med regimen (Patient verbalizes understanding of importance of medication regimen and need to continue outpatient care and support groups)  Outcome: Progressing Pt is compliant with medications and understands their thereaputic use Goal: LTG-Pt is able to verbalize triggers for his/her abuse (Patient is able to verbalize triggers for his/her abuse and strategies to maintain sobriety)  Outcome: Not Progressing Goal: STG-Patient will attend groups Outcome: Progressing Pt attends and participates in most groups Goal: STG-Patient will comply with prescribed medication regimen (Patient will comply with prescribed medication regimen)  Outcome: Progressing Pt is compliant with medications  Problem: Diagnosis: Increased Risk For Suicide Attempt Goal: LTG-Patient Will Show Positive Response to Medication LTG (by discharge) : Patient will show positive response to medication and will participate in the development of the discharge plan.  Outcome: Not Progressing Pt continues to report increased anxiety and depression

## 2014-09-26 NOTE — Clinical Social Work Note (Signed)
With patient's permission, CSW left voicemail for patient's son Jefferey PicaCody Saric [phone]. Awaiting return call.  Samuella BruinKristin Kamonte Mcmichen, MSW, [employer]CSWA Clinical Social Worker [facility] [phone]

## 2014-09-26 NOTE — Progress Notes (Signed)
D   Pt is depressed and sad   He reports little improvement of mood   He continues to interact well with peers and is active on the milieu A   Verbal support given   Medications administered and effectiveness monitored   Q 15 min checks R   Pt safe at present

## 2014-09-26 NOTE — Progress Notes (Signed)
Recreation Therapy Notes  Animal-Assisted Activity/Therapy (AAA/T) Program Checklist/Progress Notes Patient Eligibility Criteria Checklist & Daily Group note for Rec Tx Intervention  Date: 11.17.2015 Time: 2:45pm Location: [address] Dayroom    AAA/T Program Assumption of Risk Form signed by Patient/ or Parent Legal Guardian yes  Patient is free of allergies or sever asthma yes  Patient reports no fear of animals yes  Patient reports no history of cruelty to animals yes   Patient understands his/her participation is voluntary yes  Patient washes hands before animal contact yes  Patient washes hands after animal contact yes  Behavioral Response: Appropriate    Education: Hand Washing, Appropriate Animal Interaction   Education Outcome: Acknowledges education.   Clinical Observations/Feedback: Patient actively engaged in group session, petting therapy dog appropriately and interacting with peers appropriately. Additionally patient asked appropriate questions about therapy dog and his training. Patient exited session at approximately at approximately 3:15pm, patient did not return to session.   Marykay Lexenise L Caydence Koenig, LRT/CTRS  Ladarryl Wrage L 09/26/2014 4:16 PM

## 2014-09-26 NOTE — Progress Notes (Signed)
Patient ID: [name], male   DOB: 02/15/1976, 38 y.o.   MRN: [id number] [facility] MD Progress Note  09/26/2014 3:31 PM [name]  MRN:  [id number] Subjective:  Patient admits he feels better but he reports he continues to feel anxious, and also reports insomnia.  Objective: I have discussed case with treatment team and have met with patient. Patient is reporting partial improvement , particularly insofar as mood. As noted, he does continue to report anxiety and insomnia.  We discussed options to  Manage his insomnia- he states Trazodone helps partially but doses above 100 mgrs QHS have caused intolerable blurry vision in the past so does not want to titrate this medication further. He feels that Seroquel helps better, both for insomnia and for anxiety. We have reviewed the potential for Seroquel to contribute to metabolic issues, such as hyperglycemia. ( Patient is diabetic). Of note, patient states he no longer has any homicidal ideations towards wife or the man he states she was with. He states he plans to avoid her and states " I know you guys called her to tell her I had homicidal thoughts, so I think she got an order of protection against me. I have no plans of going near her". No disruptive behaviors on unit. Visible in milieu, going to groups. Presents with anxiety.  Diagnosis:  Bipolar Disorder Depressed versus Substance Induced Mood Disorder Depressed. Alcohol Dependence, Cocaine Dependence  Total Time spent with patient: 25 minutes   ADL's:  Improved   Sleep: fair   Appetite: increased  Suicidal Ideation:  Denies any suicidal ideations Homicidal Ideation:  Denies any homicidal ideations at this time AEB (as evidenced by):  Psychiatric Specialty Exam: Physical Exam  Review of Systems  Constitutional: Negative for fever and chills.  Respiratory: Negative for cough and shortness of breath.   Cardiovascular: Negative for chest pain.  Gastrointestinal: Negative for  vomiting and blood in stool.  Musculoskeletal: Positive for joint pain and neck pain.  Neurological: Positive for headaches.  Psychiatric/Behavioral: Positive for depression and substance abuse. The patient is nervous/anxious.     Blood pressure 118/57, pulse 107, temperature 98.4 F (36.9 C), temperature source Oral, resp. rate 20, height 5' 11"  (1.803 m), weight 79.833 kg (176 lb).Body mass index is 24.56 kg/(m^2).  General Appearance: improved grooming   Eye Contact::  Good  Speech:  Normal Rate  Volume:  Normal  Mood:  Less depressed but remains anxious  Affect: still anxious  Thought Process:  Linear  Orientation:  Other:  fully alert and attentive  Thought Content:  No hallucinations, no delusions  Suicidal Thoughts:  No  at this time denies any thoughts of hurting self and  contracts for safety on unit   Homicidal Thoughts:  Today denies HI  Memory:  Recent and Remote grossly intact  Judgement:  Fair  Insight:  Fair  Psychomotor Activity:  Normal  Concentration:  Good  Recall:  Good  Fund of Knowledge:Good  Language: Good  Akathisia:  Negative  Handed:  Right  AIMS (if indicated):     Assets:  Desire for Improvement Resilience  Sleep:  Number of Hours: 4.5   Musculoskeletal: Strength & Muscle Tone: within normal limits Gait & Station: normal Patient leans: N/A  Current Medications: Current Facility-Administered Medications  Medication Dose Route Frequency Provider Last Rate Last Dose  . acetaminophen (TYLENOL) tablet 650 mg  650 mg Oral Q6H PRN Laverle Hobby, PA-C   650 mg at 09/26/14 0853  .  albuterol (PROVENTIL HFA;VENTOLIN HFA) 108 (90 BASE) MCG/ACT inhaler 2 puff  2 puff Inhalation Q6H PRN Laverle Hobby, PA-C      . alum & mag hydroxide-simeth (MAALOX/MYLANTA) 200-200-20 MG/5ML suspension 30 mL  30 mL Oral Q4H PRN Laverle Hobby, PA-C      . azelastine (ASTELIN) 0.1 % nasal spray 2 spray  2 spray Each Nare BID Laverle Hobby, PA-C   2 spray at 09/26/14  0746  . DULoxetine (CYMBALTA) DR capsule 60 mg  60 mg Oral BID Jenne Campus, MD   60 mg at 09/26/14 0746  . ibuprofen (ADVIL,MOTRIN) tablet 800 mg  800 mg Oral Q6H PRN Jenne Campus, MD   800 mg at 09/26/14 1319  . insulin aspart (novoLOG) injection 0-15 Units  0-15 Units Subcutaneous TID WC Laverle Hobby, PA-C   11 Units at 09/26/14 1211  . insulin aspart (novoLOG) injection 0-5 Units  0-5 Units Subcutaneous QHS Laverle Hobby, PA-C   3 Units at 09/25/14 2256  . insulin glargine (LANTUS) injection 30 Units  30 Units Subcutaneous QHS Charlcie Cradle, MD   30 Units at 09/25/14 2255  . lamoTRIgine (LAMICTAL) tablet 100 mg  100 mg Oral Daily Jenne Campus, MD   100 mg at 09/26/14 0746  . lamoTRIgine (LAMICTAL) tablet 75 mg  75 mg Oral QHS Jenne Campus, MD   75 mg at 09/25/14 2252  . loratadine (CLARITIN) tablet 10 mg  10 mg Oral Daily Shuvon Rankin, NP   10 mg at 09/26/14 0746  . magnesium hydroxide (MILK OF MAGNESIA) suspension 30 mL  30 mL Oral Daily PRN Shuvon Rankin, NP      . metFORMIN (GLUCOPHAGE) tablet 1,000 mg  1,000 mg Oral BID WC Laverle Hobby, PA-C   1,000 mg at 09/26/14 0746  . multivitamin with minerals tablet 1 tablet  1 tablet Oral Daily Laverle Hobby, PA-C   1 tablet at 09/26/14 0745  . nicotine (NICODERM CQ - dosed in mg/24 hours) patch 21 mg  21 mg Transdermal Daily Shuvon Rankin, NP   21 mg at 09/26/14 0835  . pioglitazone (ACTOS) tablet 30 mg  30 mg Oral Daily Shuvon Rankin, NP   30 mg at 09/26/14 0745  . QUEtiapine (SEROQUEL) tablet 100 mg  100 mg Oral QHS Myer Peer Ilani Otterson, MD      . QUEtiapine (SEROQUEL) tablet 25 mg  25 mg Oral TID PRN Jenne Campus, MD   25 mg at 09/26/14 1321  . simvastatin (ZOCOR) tablet 40 mg  40 mg Oral QHS Shuvon Rankin, NP   40 mg at 09/25/14 2252  . thiamine (VITAMIN B-1) tablet 100 mg  100 mg Oral Daily Laverle Hobby, PA-C   100 mg at 09/26/14 0746  . traZODone (DESYREL) tablet 100 mg  100 mg Oral QHS Shuvon Rankin, NP   100 mg  at 09/25/14 2252    Lab Results:  Results for orders placed or performed during the hospital encounter of 09/19/14 (from the past 48 hour(s))  Glucose, capillary     Status: Abnormal   Collection Time: 09/24/14  4:52 PM  Result Value Ref Range   Glucose-Capillary 268 (H) 70 - 99 mg/dL  Glucose, capillary     Status: Abnormal   Collection Time: 09/24/14  9:21 PM  Result Value Ref Range   Glucose-Capillary 243 (H) 70 - 99 mg/dL  Glucose, capillary     Status: Abnormal   Collection Time: 09/25/14  5:51 AM  Result Value Ref Range   Glucose-Capillary 219 (H) 70 - 99 mg/dL  Glucose, capillary     Status: Abnormal   Collection Time: 09/25/14 11:14 AM  Result Value Ref Range   Glucose-Capillary 409 (H) 70 - 99 mg/dL   Comment 1 Notify RN   Glucose, capillary     Status: Abnormal   Collection Time: 09/25/14  1:03 PM  Result Value Ref Range   Glucose-Capillary 316 (H) 70 - 99 mg/dL   Comment 1 Notify RN   Glucose, capillary     Status: Abnormal   Collection Time: 09/25/14  4:42 PM  Result Value Ref Range   Glucose-Capillary 362 (H) 70 - 99 mg/dL   Comment 1 Notify RN   Glucose, capillary     Status: Abnormal   Collection Time: 09/25/14  9:24 PM  Result Value Ref Range   Glucose-Capillary 293 (H) 70 - 99 mg/dL   Comment 1 Notify RN   Glucose, capillary     Status: Abnormal   Collection Time: 09/26/14  6:06 AM  Result Value Ref Range   Glucose-Capillary 216 (H) 70 - 99 mg/dL  Glucose, capillary     Status: Abnormal   Collection Time: 09/26/14 12:02 PM  Result Value Ref Range   Glucose-Capillary 327 (H) 70 - 99 mg/dL    Physical Findings: AIMS: Facial and Oral Movements Muscles of Facial Expression: None, normal Lips and Perioral Area: None, normal Jaw: None, normal Tongue: None, normal,Extremity Movements Upper (arms, wrists, hands, fingers): None, normal Lower (legs, knees, ankles, toes): None, normal, Trunk Movements Neck, shoulders, hips: None, normal, Overall  Severity Severity of abnormal movements (highest score from questions above): None, normal Incapacitation due to abnormal movements: None, normal Patient's awareness of abnormal movements (rate only patient's report): No Awareness, Dental Status Current problems with teeth and/or dentures?: No Does patient usually wear dentures?: No  CIWA:  CIWA-Ar Total: 6 COWS:  COWS Total Score: 6   Assessment:  Patient 's mood is improved , but anxiety has been more persistent and has increased somewhat as he approaches discharge. He is not currently presenting with any significant withdrawal symptoms, so do not suspect that anxiety is solely withdrawal associated. He states Seroquel works best for anxiety and insomnia and prefers over increasing Trazodone. No longer homicidal towards wife. .  Treatment Plan Summary: Daily contact with patient to assess and evaluate symptoms and progress in treatment Medication management See below  Plan: Continue inpatient treatment, milieu, support Continue [facility] detox protocol Change Seroquel to 25 mgrs Q 8 hours PRN and 100 mgrs QHS Cymbalta  At 60 mgrs BID  Lamictal 100 mg QAM and 75 mgrs QHS Ibuprofen to 800 mgrs QID  Trazodone 100 mgrs QHS  PRN Insomnia Will order BMP to rule out hyponatremia ( which patient states he has had in the past ) , particularly in  The context of SNRI treatment  Medical Decision Making Problem Points:  Established problem, stable/improving (1), Review of last therapy session (1) and Review of psycho-social stressors (1) Data Points:  Review or order clinical lab tests (1) Review of medication regiment & side effects (2) Review of new medications or change in dosage (2)  I certify that inpatient services furnished can reasonably be expected to improve the patient's condition.   Kalynne Womac, [place] 09/26/2014, 3:31 PM

## 2014-09-26 NOTE — Progress Notes (Signed)
Pt attended spiritual care group on grief and loss facilitated by chaplain Burnis KingfisherMatthew Lyriq Finerty and counseling intern [place] Austin. Group opened with brief discussion and psycho-social ed around grief and loss in relationships and in relation to self - identifying life patterns, circumstances, changes that cause losses. Established group norm of speaking from own life experience. Group goal of establishing open and affirming space for members to share loss and experience with grief, normalize grief experience and provide psycho social education and grief support.  [name] was present throughout group.   Shared with group about spouse diagnosis with colon cancer, and soon thereafter learning of her affair.  Due to affair, he has lost connection with children (39F, 65M), loss of job.  [name] described using substances to cope with grief and fear.  Sought support from group for feelings of being "out of control."  Group identified with feelings of being out of control and fear as a trigger for use.  [name] described typical method for coping is "holding in grief," as he was "trying to be ok for my children" and "protect them."   Recognized with group that this makes him feel overwhelmed and sharing grief and anger is way of caring for himself and caring for his children.  Described his children as his primary motivation.   [name], and having some anxiety around discharging into "the real world."  He is worried about the Duty to Report homicidal ideations.  Fearful that this will be used by his wife to pursue custody.  Described feeling as though his understanding of confidentiality was breached.  Group spoke about trust and vulnerability in sharing grief.    At end of group, [name] described feeling relief in being able to speak with group members about his fears / grief, rather than "holding it in"  Belva CromeStalnaker, Kinslee Dalpe Wayne MDiv  12:02 PM 09/26/2014

## 2014-09-26 NOTE — Progress Notes (Signed)
Patient ID: [name], male   DOB: 1976-03-11, 38 y.o.   MRN: [id number] D: Patient reports poor sleep.  His appetite is good; low energy and poor concentration.  He rates his depression and anxiety as an 8; anxiety as an 6.  Patient has complained of continuous neck pain.  He has requested tylenol, ibuprofen for pain and seroquel for anxiety.  He has also request a lidocaine patch.  Patient was a possible discharge today, but thought he might stay another day due to not sleeping well.  He is concerned about the duty to report HI, as he was homicidal toward his ex-wife on admission.  He if fearful that this will affect the custody of his children. Patient will be discharged tomorrow and will follow up with   He denies any SI/HI/AVH.  He is visible on the milieu and is attending groups.  He interacts well with his peers and staff.   A: Continue to monitor medication management and MD orders.  Safety checks completed every 15 minutes per protocol. R: Patient's behavior is appropriate to situation.

## 2014-09-26 NOTE — Tx Team (Addendum)
Interdisciplinary Treatment Plan Update (Adult) Date: 09/26/2014   Time Reviewed: 9:30 AM  Progress in Treatment: Attending groups: Yes Participating in groups: Yes Taking medication as prescribed: Yes Tolerating medication: Yes Family/Significant other contact made: No, CSW unable to contact patient's father Hanley SeamenJeff Maestre Patient understands diagnosis: Yes Discussing patient identified problems/goals with staff: Yes Medical problems stabilized or resolved: Yes Denies suicidal/homicidal ideation: Yes, denies Issues/concerns per patient self-inventory: Yes Other:  New problem(s) identified: N/A  Discharge Plan or Barriers: Patient agreeable to return home to follow up with his current outpatient providers [facility]. Per MD, Duty to Warn wife warranted as patient has expressed HI towards wife at admission and during hospitalization- completed on 09/22/14. Patient reports experiencing difficulty sleeping and anxiety, MD to adjust medications.  Reason for Continuation of Hospitalization:  Depression Anxiety Medication Stabilization   Comments: N/A  Estimated length of stay: Discharge anticipated for tomorrow 11/18.  For review of initial/current patient goals, please see plan of care. Patient is a 38 year old Caucasian Male with a diagnosis of Bipolar, Depressed and Alcohol Abuse; Cocaine Abuse. [place] 2 teenage children. Patient reports that he is currently hospitalized following a relapse on alcohol and cocaine and experiencing SI and HI towards his wife. Patient identifies his stressors as finding out in June 2015 that his wife has stage III colon cancer and then in August that his wife was having an affair. Wife left patient and he also discovered that bills were not being paid. He also lost his job recently. Patient reports that he has been experiencing SI/HI for  periodically for approximately 1 month but that these thoughts have increased overt the past week. Patient denies any SI/HI at this time. Patient was calm and cooperative during assessment with flat and depressed affect. Patient has had multiple previous admissions. Patient declines residential treatment at this time and plans to return home to follow up with his current outpatient providers at discharge. His goal is to find "peace of mind." Patient will benefit from crisis stabilization, medication evaluation, group therapy, and psycho education in addition to case management for discharge planning. Patient and CSW reviewed pt's identified goals and treatment plan. Pt verbalized understanding and agreed to treatment plan.  Attendees: Patient:    Family:    Physician: Dr. Jama Flavorsobos 09/26/2014 9:30 AM  Nursing: Burnetta SabinBrittany Tyson, Norville Haggardaroline B., Janet Marshall, RN 09/26/2014 9:30 AM  Clinical Social Worker: Belenda CruiseKristin Alithia Zavaleta,  LCSWA 09/26/2014 9:30 AM  Other: Juline PatchQuylle Hodnett, LCSW 09/26/2014 9:30 AM  Other: Leisa LenzValerie Enoch, Vesta [facility]er[facility]arch Liaison 09/26/2014 9:30 AM  Other: Sydell AxonElaina, Pharmacist  09/26/2014 9:30 AM  Other:  09/26/2014 9:30 AM  Other:  09/26/2014 9:30 AM  Other:    Other:    Other:    Other:       Scribe for Treatment Team:  Samuella BruinKristin Marikay Roads, MSW, [employer]CSWA [phone]

## 2014-09-26 NOTE — Plan of Care (Signed)
Problem: Alteration in mood; excessive anxiety as evidenced by: Goal: LTG-Patient's behavior demonstrates decreased anxiety 11/11: Goal not met: Pt presents with anxious mood and affect. Pt with anxiety rating of 10. Pt to show decreased sign of anxiety and a rating of 3 or less before d/c.  Kristin Drinkard, MSW, LCSWA Clinical Social Worker [facility] [phone]   11/13: Goal not met: Pt presents with anxious mood and affect. Pt with anxiety rating of 10. Pt to show decreased sign of anxiety and a rating of 3 or less before d/c.  Kristin Drinkard, MSW, LCSWA Clinical Social Worker [facility] [phone]     (Patient's behavior demonstrates anxiety and he/she is utilizing learned coping skills to deal with anxiety-producing situations)  Outcome: Not Met (add Reason) Patient continues to endorse severe anxiety and request medication. Goal: STG-Pt will report an absence of self-harm thoughts/actions (Patient will report an absence of self-harm thoughts or actions)  Outcome: Completed/Met Date Met:  09/26/14 Patient denies any SI and plans to discharge tomorrow.     

## 2014-09-26 NOTE — Clinical Social Work Note (Signed)
Per patient request, CSW contacted son Jefferey PicaCody Pavelko [phone] to inform him of patient's discharge scheduled for tomorrow 11/18. CSW also provided SPE.  Samuella BruinKristin Rossanna Spitzley, MSW, [employer]CS[employer] Clinical Social Worker [facility] [phone]

## 2014-09-27 ENCOUNTER — Telehealth: Payer: Self-pay | Admitting: Family

## 2014-09-27 DIAGNOSIS — E871 Hypo-osmolality and hyponatremia: Secondary | ICD-10-CM

## 2014-09-27 DIAGNOSIS — F1024 Alcohol dependence with alcohol-induced mood disorder: Secondary | ICD-10-CM | POA: Insufficient documentation

## 2014-09-27 DIAGNOSIS — F3130 Bipolar disorder, current episode depressed, mild or moderate severity, unspecified: Secondary | ICD-10-CM | POA: Insufficient documentation

## 2014-09-27 LAB — BASIC METABOLIC PANEL
ANION GAP: 13 (ref 5–15)
BUN: 6 mg/dL (ref 6–23)
CHLORIDE: 91 meq/L — AB (ref 96–112)
CO2: 25 mEq/L (ref 19–32)
Calcium: 9.5 mg/dL (ref 8.4–10.5)
Creatinine, Ser: 0.65 mg/dL (ref 0.50–1.35)
GFR calc non Af Amer: >90 mL/min (ref >90)
Glucose, Bld: 262 mg/dL — ABNORMAL HIGH (ref 70–99)
POTASSIUM: 4.4 meq/L (ref 3.7–5.3)
SODIUM: 129 meq/L — AB (ref 137–147)

## 2014-09-27 LAB — GLUCOSE, CAPILLARY
GLUCOSE-CAPILLARY: 277 mg/dL — AB (ref 70–99)
Glucose-Capillary: 222 mg/dL — ABNORMAL HIGH (ref 70–99)

## 2014-09-27 MED ORDER — LAMOTRIGINE 100 MG PO TABS: 100.0000 mg | ORAL_TABLET | Freq: Every day | ORAL | Status: DC

## 2014-09-27 MED ORDER — INSULIN GLARGINE 100 UNIT/ML SOLOSTAR PEN: 30.0000 [IU] | PEN_INJECTOR | Freq: Every day | SUBCUTANEOUS | Status: DC

## 2014-09-27 MED ORDER — CEPHALEXIN 500 MG PO CAPS: 500.0000 mg | ORAL_CAPSULE | Freq: Two times a day (BID) | ORAL | Status: DC

## 2014-09-27 MED ORDER — TRAZODONE HCL 100 MG PO TABS: 100.0000 mg | ORAL_TABLET | Freq: Every day | ORAL | Status: DC

## 2014-09-27 MED ORDER — AZELASTINE HCL 0.1 % NA SOLN: 2.0000 | Freq: Two times a day (BID) | NASAL | Status: DC

## 2014-09-27 MED ORDER — ADULT MULTIVITAMIN W/MINERALS CH: 1.0000 | ORAL_TABLET | Freq: Every day | ORAL | Status: DC

## 2014-09-27 MED ORDER — LAMOTRIGINE 25 MG PO TABS: 75.0000 mg | ORAL_TABLET | Freq: Every day | ORAL | Status: DC

## 2014-09-27 MED ORDER — METFORMIN HCL 1000 MG PO TABS: 1000.0000 mg | ORAL_TABLET | Freq: Two times a day (BID) | ORAL | Status: DC

## 2014-09-27 MED ORDER — QUETIAPINE FUMARATE 100 MG PO TABS: 100.0000 mg | ORAL_TABLET | Freq: Every day | ORAL | Status: DC

## 2014-09-27 MED ORDER — PIOGLITAZONE HCL 30 MG PO TABS: 30.0000 mg | ORAL_TABLET | Freq: Every day | ORAL | Status: DC

## 2014-09-27 MED ORDER — DULOXETINE HCL 60 MG PO CPEP: 60.0000 mg | ORAL_CAPSULE | Freq: Two times a day (BID) | ORAL | Status: DC

## 2014-09-27 NOTE — Discharge Summary (Signed)
Physician Discharge Summary Note  Patient:  [name] is an 38 y.o., male MRN:  [id number] DOB:  1976/04/11 Patient phone:  [phone] (home)  Patient address:   [place] [address],  Total Time spent with patient: Greater than 30 minutes  Date of Admission:  09/19/2014 Date of Discharge: 09/27/14  Reason for Admission:  Alcohol dependence  Discharge Diagnoses: Active Problems:   MDD (major depressive disorder), severe   Substance induced mood disorder  Psychiatric Specialty Exam: Physical Exam  Psychiatric: His speech is normal and behavior is normal. Judgment and thought content normal. His mood appears not anxious. His affect is not angry, not blunt, not labile and not inappropriate. Cognition and memory are normal. He does not exhibit a depressed mood.    Review of Systems  Constitutional: Negative.   HENT: Negative.   Eyes: Negative.   Respiratory: Negative.   Cardiovascular: Negative.   Gastrointestinal: Negative.   Genitourinary: Negative.   Musculoskeletal: Positive for back pain (Chronic ).  Skin: Negative.   Neurological: Negative.   Endo/Heme/Allergies: Negative.   Psychiatric/Behavioral: Positive for depression (Stabilized with mdication prior to discharge) and substance abuse (Alcohol dependence). Negative for suicidal ideas, hallucinations and memory loss. The patient has insomnia (Stabilized with medication prior to discharge). The patient is not nervous/anxious.     Blood pressure 128/76, pulse 105, temperature 98.3 F (36.8 C), temperature source Oral, resp. rate 18, height 5' 11" (1.803 m), weight 79.833 kg (176 lb).Body mass index is 24.56 kg/(m^2).   See Physician SRA     Past Psychiatric History: Yes Diagnosis: Reports Bipolar Disorder, Alcohol abuse  Hospitalizations: [facility] adult unit  Outpatient Care: [facility] Outpatient clinic Dr. Adele Schilder  Substance Abuse Care: [facility] Outpatient clinic  Self-Mutilation: Denies   Suicidal Attempts: Denies  Violent Behaviors: Some history of explosive anger in the past   Musculoskeletal: Strength & Muscle Tone: within normal limits Gait & Station: normal Patient leans: N/A  DSM5:  AXIS I: Bipolar Disorder Depressed versus Substance Induced Mood Disorder Depressed. Alcohol Dependence, Cocaine Dependence AXIS II: Deferred AXIS III:  Past Medical History  Diagnosis Date  . Allergy   . Depression   . GERD (gastroesophageal reflux disease)   . Hyperlipidemia   . Chronic pain disorder     Sees [facility]  . Urinary incontinence     detrusor instability  . Hypogonadism   . Diabetes mellitus     Type II  . Chronic neck pain   . Bipolar depression    AXIS IV: economic problems and problems related to social environment AXIS V: 65 upon discharge   Level of Care:  OP  Hospital Course:    [name] is a 38 year old man. He states that about two months ago he found his wife was being unfaithful and they have been separated since then. More recently he lost his job, and States he also found out that his wife has not been paying home bills, so that now his financial situation is precarious. He states he relapsed on alcohol and cocaine over the last few weeks. He reports that prior to this he had been sober for almost thirteen years, except for brief relapse a few months ago. He reports depression, anxiety, and states he developed suicidal ideations, with some thoughts of shooting self, and also some homicidal ideations towards wife and man she was unfaithful with ( whom he states he does not know or know name of).  [name] was admitted to the adult unit. He was evaluated and his symptoms were identified. Medication management was discussed and initiated. Patient was detoxed from alcohol using the librium detox protocol. His Cymbalta dosage was increased to 60 mg BID for treatment of  depression and chronic pain. The patient was started on Seroquel 100 mg at hs to help with insomnia and anxiety. This medication was also ordered on a prn basis to help with anxiety during his hospital stay. His Lamictal was increased to a total daily dose of 175 mg to help with improved mood control and stability.  He was oriented to the unit and encouraged to participate in unit programming. Medical problems were identified and treated appropriately. Home medication was restarted as needed. His Lantus insulin was increased to 30 units at bedtime to address elevated blood sugars. Patient was also ordered Novolog SSI during his admission with appropriate blood glucose monitoring.         The patient was evaluated each day by a clinical provider to ascertain the patient's response to treatment.  Improvement was noted by the patient's report of decreasing symptoms, improved sleep and appetite, affect, medication tolerance, behavior, and participation in unit programming.  He was asked each day to complete a self inventory noting mood, mental status, pain, new symptoms, anxiety and concerns. Patient denied that he had any intention on acting on the homicidal thoughts that he expressed towards his wife prior to admission. The social worker had informed his wife of the threats. The patient reported that she had most likely taken out an order or protectin against him. The patient did report some ongoing problems with anxiety and insomnia. However, his medications continued to be adjusted to address his symptoms.          He responded well to medication and being in a therapeutic and supportive environment. Positive and appropriate behavior was noted and the patient was motivated for recovery.  The patient worked closely with the treatment team and case manager to develop a discharge plan with appropriate goals. Coping skills, problem solving as well as relaxation therapies were also part of the unit programming.          By the day of discharge he was in much improved condition than upon admission.  Symptoms were reported as significantly decreased or resolved completely. The patient denied SI/HI and voiced no AVH. He was motivated to continue taking medication with a goal of continued improvement in mental health.  [name] was discharged home with a plan to follow up as noted below. Prior to discharge his sodium level was noted to be 129. Internal medicine was consulted by telephone to determine the etiology of this. The patient has a history of chronic hyponatremia. There was concern of patient continuing on Cymbalta which can cause hyponatremia. As the patient was not showing any mental status changes and has history of low sodium his therapy was not changed by the attending Psychiatrist. Patient was provided with sample medications and prescriptions.    Consults:  psychiatry  Significant Diagnostic Studies:    Discharge Vitals:   Blood pressure 128/76, pulse 105, temperature 98.3 F (36.8 C), temperature source Oral, resp. rate 18, height 5' 11" (1.803 m), weight 79.833 kg (176 lb). Body mass index is 24.56 kg/(m^2). Lab Results:   Results for orders placed or performed during the hospital encounter of 09/19/14 (from the past 72 hour(s))  Glucose, capillary     Status: Abnormal  Collection Time: 09/24/14 11:52 AM  Result Value Ref Range   Glucose-Capillary 318 (H) 70 - 99 mg/dL   Comment 1 Notify RN   Glucose, capillary     Status: Abnormal   Collection Time: 09/24/14  2:33 PM  Result Value Ref Range   Glucose-Capillary 235 (H) 70 - 99 mg/dL  Glucose, capillary     Status: Abnormal   Collection Time: 09/24/14  4:52 PM  Result Value Ref Range   Glucose-Capillary 268 (H) 70 - 99 mg/dL  Glucose, capillary     Status: Abnormal   Collection Time: 09/24/14  9:21 PM  Result Value Ref Range   Glucose-Capillary 243 (H) 70 - 99 mg/dL  Glucose, capillary     Status: Abnormal   Collection Time:  09/25/14  5:51 AM  Result Value Ref Range   Glucose-Capillary 219 (H) 70 - 99 mg/dL  Glucose, capillary     Status: Abnormal   Collection Time: 09/25/14 11:14 AM  Result Value Ref Range   Glucose-Capillary 409 (H) 70 - 99 mg/dL   Comment 1 Notify RN   Glucose, capillary     Status: Abnormal   Collection Time: 09/25/14  1:03 PM  Result Value Ref Range   Glucose-Capillary 316 (H) 70 - 99 mg/dL   Comment 1 Notify RN   Glucose, capillary     Status: Abnormal   Collection Time: 09/25/14  4:42 PM  Result Value Ref Range   Glucose-Capillary 362 (H) 70 - 99 mg/dL   Comment 1 Notify RN   Glucose, capillary     Status: Abnormal   Collection Time: 09/25/14  9:24 PM  Result Value Ref Range   Glucose-Capillary 293 (H) 70 - 99 mg/dL   Comment 1 Notify RN   Glucose, capillary     Status: Abnormal   Collection Time: 09/26/14  6:06 AM  Result Value Ref Range   Glucose-Capillary 216 (H) 70 - 99 mg/dL  Glucose, capillary     Status: Abnormal   Collection Time: 09/26/14 12:02 PM  Result Value Ref Range   Glucose-Capillary 327 (H) 70 - 99 mg/dL  Glucose, capillary     Status: Abnormal   Collection Time: 09/26/14  4:52 PM  Result Value Ref Range   Glucose-Capillary 254 (H) 70 - 99 mg/dL  Glucose, capillary     Status: Abnormal   Collection Time: 09/26/14  8:43 PM  Result Value Ref Range   Glucose-Capillary 236 (H) 70 - 99 mg/dL  Glucose, capillary     Status: Abnormal   Collection Time: 09/27/14  6:05 AM  Result Value Ref Range   Glucose-Capillary 222 (H) 70 - 99 mg/dL  Basic metabolic panel     Status: Abnormal   Collection Time: 09/27/14  6:15 AM  Result Value Ref Range   Sodium 129 (L) 137 - 147 mEq/L   Potassium 4.4 3.7 - 5.3 mEq/L   Chloride 91 (L) 96 - 112 mEq/L   CO2 25 19 - 32 mEq/L   Glucose, Bld 262 (H) 70 - 99 mg/dL   BUN 6 6 - 23 mg/dL   Creatinine, Ser 0.65 0.50 - 1.35 mg/dL   Calcium 9.5 8.4 - 10.5 mg/dL   GFR calc non Af Amer >90 >90 mL/min   GFR calc Af Amer >90 >90  mL/min    Comment: (NOTE) The eGFR has been calculated using the CKD EPI equation. This calculation has not been validated in all clinical situations. eGFR's persistently <90 mL/min signify possible Chronic  Kidney Disease.    Anion gap 13 5 - 15    Comment: Performed at [facility]    Physical Findings: AIMS: Facial and Oral Movements Muscles of Facial Expression: None, normal Lips and Perioral Area: None, normal Jaw: None, normal Tongue: None, normal,Extremity Movements Upper (arms, wrists, hands, fingers): None, normal Lower (legs, knees, ankles, toes): None, normal, Trunk Movements Neck, shoulders, hips: None, normal, Overall Severity Severity of abnormal movements (highest score from questions above): None, normal Incapacitation due to abnormal movements: None, normal Patient's awareness of abnormal movements (rate only patient's report): No Awareness, Dental Status Current problems with teeth and/or dentures?: No Does patient usually wear dentures?: No  CIWA:  CIWA-Ar Total: 6 COWS:  COWS Total Score: 6  Psychiatric Specialty Exam: See Psychiatric Specialty Exam and Suicide Risk Assessment completed by Attending Physician prior to discharge.  Discharge destination:  Home  Is patient on multiple antipsychotic therapies at discharge:  No   Has Patient had three or more failed trials of antipsychotic monotherapy by history:  No  Recommended Plan for Multiple Antipsychotic Therapies: NA     Medication List    ASK your doctor about these medications      Indication   albuterol 108 (90 BASE) MCG/ACT inhaler  Commonly known as:  PROVENTIL HFA;VENTOLIN HFA  Inhale 2 puffs into the lungs every 6 (six) hours as needed for wheezing or shortness of breath.      augmented betamethasone dipropionate 0.05 % cream  Commonly known as:  DIPROLENE-AF  Apply 1 application topically 2 (two) times daily as needed. rash      azelastine 0.1 % nasal spray   Commonly known as:  ASTELIN  Place 2 sprays into both nostrils 2 (two) times daily. Use in each nostril as directed      cephALEXin 500 MG capsule  Commonly known as:  KEFLEX  Take 1 capsule (500 mg total) by mouth 2 (two) times daily.      cetirizine 10 MG tablet  Commonly known as:  ZYRTEC  Take 1 tablet (10 mg total) by mouth daily. For allergies   Indication:  Perennial Rhinitis, Hayfever     DULoxetine 60 MG capsule  Commonly known as:  CYMBALTA  Take 1 capsule (60 mg total) by mouth daily. For depression   Indication:  Generalized Anxiety Disorder, Major Depressive Disorder, Musculoskeletal Pain     hydrOXYzine 50 MG tablet  Commonly known as:  ATARAX/VISTARIL  Take 50 mg by mouth daily as needed for anxiety.      lamoTRIgine 150 MG tablet  Commonly known as:  LAMICTAL  Take 1 tablet (150 mg total) by mouth daily.      LANTUS SOLOSTAR 100 UNIT/ML Solostar Pen  Generic drug:  Insulin Glargine  Inject 20 Units into the skin at bedtime.      metFORMIN 1000 MG tablet  Commonly known as:  GLUCOPHAGE  Take 1,000 mg by mouth 2 (two) times daily with a meal.      omega-3 acid ethyl esters 1 G capsule  Commonly known as:  LOVAZA  Take 4 capsules (4 g total) by mouth daily. For high cholesterol/fats   Indication:  High Amount of Triglycerides in the Blood     ondansetron 4 MG tablet  Commonly known as:  ZOFRAN  Take 1 tablet (4 mg total) by mouth every 8 (eight) hours as needed for nausea or vomiting.      pioglitazone 30 MG tablet  Commonly known as:  ACTOS  Take 1 tablet (30 mg total) by mouth daily.      simvastatin 40 MG tablet  Commonly known as:  ZOCOR  Take 1 tablet (40 mg total) by mouth at bedtime. For high cholesterol   Indication:  Inherited Homozygous Hypercholesterolemia     TESTOPEL 75 MG Pllt  Generic drug:  Testosterone  by Implant route.      traZODone 100 MG tablet  Commonly known as:  DESYREL  Take 1 tablet (100 mg total) by mouth at bedtime.  For sleep   Indication:  Trouble Sleeping       Follow-up Information    Follow up with [facility] On 09/28/2014.   Why:  Medication management appointment with Dr. Adele Schilder on Thurday Nov. 19th at 3pm. Please call office if you need to reschedule.   Contact information:   [address] [place], [place] [address] [phone]      Follow up with [facility] On 10/04/2014.   Why:  Therapy appointment on Wednesday 11/25 at 12pm with [name]. Please call office if you need to reschedule.   Contact information:   [address] [place], [place] [address] [phone]      Follow-up recommendations: Activity:  As tolerated Diet: As recommended by your primary care doctor. Keep all scheduled follow-up appointments as recommended.    Comments: Take all your medications as prescribed by your mental healthcare provider. Report any adverse effects and or reactions from your medicines to your outpatient provider promptly. Patient is instructed and cautioned to not engage in alcohol and or illegal drug use while on prescription medicines. In the event of worsening symptoms, patient is instructed to call the crisis hotline, 911 and or go to the nearest ED for appropriate evaluation and treatment of symptoms. Follow-up with your primary care provider for your other medical issues, concerns and or health care needs.    Total Discharge Time:  Greater than 30 minutes.  SignedElmarie Shiley, NP-C 09/27/2014, 10:19 AM   Patient seen, Suicide Assessment Completed.  Disposition Plan Reviewed

## 2014-09-27 NOTE — Progress Notes (Signed)
[facility] :  Will you be returning to the same living situation after discharge: Yes,  patient will return home At discharge, do you have transportation home?:Yes,  [facility] Do you have the ability to pay for your medications:Yes,  patient will be provided with medication samples and prescriptions at discharge  Release of information consent forms completed and in the chart;  Patient's signature needed at discharge.  Patient to Follow up at: Follow-up Information    [facility] On 09/28/2014.   Why:  Medication management appointment with Dr. Lolly MustacheArfeen on Thurday Nov. 19th at 3pm. Please call office if you need to reschedule.   Contact information:   [address] [place], [place] [address] [phone]      [facility] On 10[facility]2014.   Why:  Therapy appointment on Wednesday 11/25 at 12pm with Berniece AndreasJulie Whitt. Please call office if you need to reschedule.   Contact information:   [address] [place], [place] [address] [phone]       Patient denies SI/HI:   Yes,  denies    Safety Planning and Suicide Prevention discussed:  Yes,  with patient and son Hulan SaasCody  Tamon Parkerson L 09/27/2014, 11:12 AM

## 2014-09-27 NOTE — [facility] Group Notes (Signed)
[facility] LCSW Group Therapy 09/27/2014  1:15 PM Type of Therapy: Group Therapy Participation Level: Active  Participation Quality: Attentive, Sharing and Supportive  Affect: Depressed and Flat  Cognitive: Alert and Oriented  Insight: Developing/Improving and Engaged  Engagement in Therapy: Developing/Improving and Engaged  Modes of Intervention: Clarification, Confrontation, Discussion, Education, Exploration, Limit-setting, Orientation, Problem-solving, Rapport Building, Dance movement psychotherapisteality Testing, Socialization and Support  Summary of Progress/Problems: The topic for group today was emotional regulation. This group focused on both positive and negative emotion identification and allowed group members to process ways to identify feelings, regulate negative emotions, and find healthy ways to manage internal/external emotions. Group members were asked to reflect on a time when their reaction to an emotion led to a negative outcome and explored how alternative responses using emotion regulation would have benefited them. Group members were also asked to discuss a time when emotion regulation was utilized when a negative emotion was experienced. Patient discussed wanting to deal with feelings of guilt and anger. He reports wanting to address childhood issues in individual therapy and is feeling more hopeful about the future. CSW and other group members provided emotional support and encouragement.  Samuella BruinKristin Dominick Zertuche, MSW, [employer]WA Clinical Social Worker [facility] [phone]

## 2014-09-27 NOTE — [facility] Suicide Risk Assessment (Addendum)
Demographic Factors:  38 year old male, separated, has two children, on disability  Total Time spent with patient: 30 minutes  Psychiatric Specialty Exam: Physical Exam  ROS  Blood pressure 128/76, pulse 105, temperature 98.3 F (36.8 C), temperature source Oral, resp. rate 18, height 5\' 11"  (1.803 m), weight 79.833 kg (176 lb).Body mass index is 24.56 kg/(m^2).  General Appearance: Well Groomed  Patent attorneyye Contact::  Good  Speech:  Normal Rate  Volume:  Normal  Mood:  improved, today denies depression  Affect:  Appropriate  Thought Process:  Goal Directed and Linear  Orientation:  Full (Time, Place, and Person)  Thought Content:  denies hallucinations, no delusions  Suicidal Thoughts:  No- at this time denies any suicidal ideations and also has denied any violent of homicidal ideations towards wife or anyone else.  Homicidal Thoughts:  No  Memory:  recent and remote grossly intact   Judgement:  Other:  improved  Insight:  improving  Psychomotor Activity:  Normal  Concentration:  Good  Recall:  Good  Fund of Knowledge:Good  Language: Good  Akathisia:  Negative  Handed:  Right  AIMS (if indicated):     Assets:  Desire for Improvement Housing Resilience  Sleep:  Number of Hours: 4.5    Musculoskeletal: Strength & Muscle Tone: within normal limits Gait & Station: normal Patient leans: N/A   Mental Status Per Nursing Assessment::   On Admission:  Self-harm thoughts  Current Mental Status by Physician: At this time is improved compared to admission. He is alert , attentive, reports improved mood and affect is fuller in range. No thought disorder, no SI or HI, no psychotic symptoms. Future oriented.  Loss Factors: Separation from wife, financial concerns, chronic illness ( DM) , disability  Historical Factors: Patient has been diagnosed with Mood Disorder in the past, endorses depression. Also has history of alcohol abuse. No history of suicide attempts. Of note patient  states he has been on Cymbalta for " years" and feels this medication has been helpful and well tolerated. He has a history of hyponatremia, particularly over recent months- he does not feel this is related to cymbalta as he had no issues with it in the past. He is followed by his PCP for hyponatremia, and states he has had it as " low as 115" in the past. At this time it is 129. Patient has no symptoms associated with this. I have spoken with Hospitalist Service and at this time  They recommend no contraindication for discharge or need to change/adjust med regimen. I have also spoken with his PCP's RN and they will see him today or tomorrow to follow up.  Risk Reduction Factors:   Responsible for children under 40 years of age, Sense of responsibility to family and Positive coping skills or problem solving skills  Continued Clinical Symptoms:  At this time he is improved, with improved mood and affect, no longer having any homicidal or violent ideations, not suicidal , not psychotic. Future oriented . No withdrawal symptoms. * Of note, patient has a  Cognitive Features That Contribute To Risk:  No gross cognitive deficits noted upon discharge. Is alert , attentive, and oriented x 3   Suicide Risk:  Mild:  Suicidal ideation of limited frequency, intensity, duration, and specificity.  There are no identifiable plans, no associated intent, mild dysphoria and related symptoms, good self-control (both objective and subjective assessment), few other risk factors, and identifiable protective factors, including available and accessible social support.  Discharge Diagnoses:   AXIS I:  Bipolar Disorder Depressed versus Substance Induced Mood Disorder Depressed. Alcohol Dependence, Cocaine Dependence AXIS II:  Deferred AXIS III:   Past Medical History  Diagnosis Date  . Allergy   . Depression   . GERD (gastroesophageal reflux disease)   . Hyperlipidemia   . Chronic pain disorder     Sees  [facility]  . Urinary incontinence     detrusor instability  . Hypogonadism   . Diabetes mellitus     Type II  . Chronic neck pain   . Bipolar depression    AXIS IV:  economic problems and problems related to social environment AXIS V: 65 upon discharge   Plan Of Care/Follow-up recommendations:  Activity:  As tolerated Diet:  Diabetic Diet  Tests:  NA Other:  See below  Is patient on multiple antipsychotic therapies at discharge:  No   Has Patient had three or more failed trials of antipsychotic monotherapy by history:  No  Recommended Plan for Multiple Antipsychotic Therapies: NA  Patient is leaving unit in good spirits.  *We have reviewed hyponatremia issues- as noted patient reports this is chronic and currently better than it has been in the past- no symptoms. He is aware of Cymbalta being a possible cause of low sodium, but states cymbalta has been effective and he has been on it for years without side effects- prefers to continue it rather than D/C this medication.  He is going to follow up with his PCP, Sandford CrazeMelissa [place]- with his express consent I have spoken with RN for PCP and relayed information on current status and hyponatremia/hyperglycemia. Patient will go tomorrow.  For psychiatric services, follow up as below. [facility]28/2014.    Why: Medication management appointment with Dr. Lolly MustacheArfeen on Thurday Nov. 19th at 3pm. Please call office if you need to reschedule.   Contact information:   [address] [place], [place] [address] [phone]      [facility] On 10/04/2014.   Why: Therapy appointment on Wednesday 11/25 at 12pm with Berniece AndreasJulie Whitt. Please call office if you need to reschedule.   Contact information:   [address] [place], [place] [address]       Nehemiah MassedOBOS, Jashad Depaula 09/27/2014, 2:15 PM

## 2014-09-27 NOTE — Telephone Encounter (Signed)
Please contact pt to arrange hospital follow up. 

## 2014-09-27 NOTE — [facility] Group Notes (Signed)
   [facility] Note  09/27/2014  8:45 AM   Participation Quality: Alert, Appropriate and Oriented  Mood/Affect: Appropriate  Depression Rating: 2  Anxiety Rating: 5/6  Thoughts of Suicide: Pt denies SI/HI  Will you contract for safety? Yes  Current AVH: Pt denies  Plan for Discharge/Comments: Pt attended discharge planning group and actively participated in group. CSW provided pt with today's workbook. Patient reports feeling ready to discharge home today to follow up with Dr. Lolly MustacheArfeen [facility]. Patient denies SI/HI and can contract for safety.  Transportation Means: Pt reports access to transportation- [facility].  Supports: No supports mentioned at this time  Samuella BruinKristin Ritisha Deitrick, MSW, [employer]CSWA Clinical Social Worker [facility] [phone]

## 2014-09-27 NOTE — Progress Notes (Signed)
Discharge Note: Discharge instructions/prescriptions/medication samples given to patient. Patient verbalized understanding of discharge instructions and prescriptions. Returned belongings to patient. Denies SI/HI/AVH. Patient d/c without incident to the lobby and will transport self home.

## 2014-09-27 NOTE — Telephone Encounter (Addendum)
Caller name: Nehemiah MassedFernando Cobos Relation to pt: Physician [place]  Call back number: [phone] Pharmacy:  Reason for call:    Nehemiah MassedFernando Cobos [facility] states pt will be discharged today and would like pt to come in to check he's sodium level. physician states pt was in due to physiatric concerns and would like to discuss sodium levels.

## 2014-09-27 NOTE — Telephone Encounter (Signed)
Appointment scheduled.

## 2014-09-28 ENCOUNTER — Encounter (HOSPITAL_COMMUNITY): Payer: Self-pay | Admitting: Psychiatry

## 2014-09-28 ENCOUNTER — Ambulatory Visit (INDEPENDENT_AMBULATORY_CARE_PROVIDER_SITE_OTHER): Payer: PRIVATE HEALTH INSURANCE | Admitting: Psychiatry

## 2014-09-28 ENCOUNTER — Encounter: Payer: Self-pay | Admitting: Family

## 2014-09-28 ENCOUNTER — Telehealth: Payer: Self-pay

## 2014-09-28 ENCOUNTER — Other Ambulatory Visit (INDEPENDENT_AMBULATORY_CARE_PROVIDER_SITE_OTHER): Payer: PRIVATE HEALTH INSURANCE

## 2014-09-28 VITALS — BP 127/82 | HR 109 | Ht 71.0 in | Wt 193.2 lb

## 2014-09-28 DIAGNOSIS — E871 Hypo-osmolality and hyponatremia: Secondary | ICD-10-CM

## 2014-09-28 DIAGNOSIS — F3131 Bipolar disorder, current episode depressed, mild: Secondary | ICD-10-CM

## 2014-09-28 DIAGNOSIS — F319 Bipolar disorder, unspecified: Secondary | ICD-10-CM

## 2014-09-28 LAB — BASIC METABOLIC PANEL
BUN: 8 mg/dL (ref 6–23)
CO2: 27 mEq/L (ref 19–32)
CREATININE: 0.8 mg/dL (ref 0.4–1.5)
Calcium: 9.9 mg/dL (ref 8.4–10.5)
Chloride: 97 mEq/L (ref 96–112)
GFR: 113.85 mL/min (ref >60.00)
Glucose, Bld: 207 mg/dL — ABNORMAL HIGH (ref 70–99)
Potassium: 5 mEq/L (ref 3.5–5.1)
Sodium: 133 mEq/L — ABNORMAL LOW (ref 135–145)

## 2014-09-28 LAB — MAGNESIUM: MAGNESIUM: 1.9 mg/dL (ref 1.5–2.5)

## 2014-09-28 MED ORDER — HYDROXYZINE HCL 50 MG PO TABS
50.0000 mg | ORAL_TABLET | Freq: Every day | ORAL | Status: DC | PRN
Start: 2014-09-28 — End: 2015-01-12

## 2014-09-28 MED ORDER — ZIPRASIDONE HCL 20 MG PO CAPS: ORAL_CAPSULE | ORAL | Status: DC

## 2014-09-28 MED ORDER — LAMOTRIGINE 200 MG PO TABS: 200.0000 mg | ORAL_TABLET | Freq: Every day | ORAL | Status: DC

## 2014-09-28 NOTE — Telephone Encounter (Signed)
Spoke with patient. Schedule to come in at 1130am to get a lab draw for BMET and Mag Level.

## 2014-09-28 NOTE — Progress Notes (Signed)
[name] [phone] Progress Note  [name] [id number] 38 y.o.  09/28/2014 3:33 PM  Chief Complaint:  I was again admitted to behavioral [place] because of was very depressed.        History of Present Illness:  [name] came for his followup appointment .  He was again admitted to behavioral [place] this month after he lost his job and feeling very depressed, sad and having suicidal thoughts.  He was also drinking alcohol and using cocaine.  He was missing his wife who cheated on him .  He was thinking about her.  During hospitalization he was given Seroquel and increase Lamictal.  His blood alcohol level was 66 and he was positive for cocaine.  Patient admitted that he was noncompliant with his therapist appointment and he was not taking his medication as prescribed.  He is feeling better but he is concerned about his blood sugar.  Since he is taking Seroquel his sugar has been increased.  He is sleeping better .  He is happy that he is able to get another job and he will start this Monday.  He is not seeing his wife every day anymore.  Before she was coming every day to pick up her her daughter for the school but down daughter is living with his wife .  He admitted having homicidal thoughts when he was drinking alcohol but he has no more these bad thoughts and he wants to move on in his life.  Patient is living along with his 36 year old son.  Patient is still have a lot of rumination , negative thinking denies any suicidal thoughts or any plan.  Since his release from the hospital he is not drinking or using any illegal substances.  He is scheduled to see his therapist [name] tomorrow.  He also had blood work today to check his sodium level.  Patient is scheduled to see his primary care physician on Monday.  He denies any paranoia or any hallucination.  His appetite is okay.  His vitals are stable.  His hemoglobin A1c is very high.   Suicidal Ideation: No Plan  Formed: No Patient has means to carry out plan: No  Homicidal Ideation: No Plan Formed: No Patient has means to carry out plan: No  Past Psychiatric History/Hospitalization(s) Patient has multiple psychiatric hospital admission in past. His last psychiatric hospitalization was November 2015 at [facility].  He was depressed and having suicidal thoughts.  He was drinking alcohol and using cocaine.  Earlier than then he was admitted from Mar 20, 2014 to Mar 24, 2014.  He was not taking his Lamictal and relapsed into drinking.  His blood alcohol level was 149 and his UDS is positive for cocaine.  In the past he has hospitalization at [facility], [place].  He has history of suicidal thinking and overdose on Klonopin.  He endorsed history of paranoia, anger , substance use , mood swings anger and irritability.  He had tried Depakote, BuSpar, Risperdal, Prozac in recent months but stopped because of the side effects.  He endorsed increased prolactin level which also Risperdal .  He has history of using Xanax, Valium and Klonopin.   Anxiety: Yes Bipolar Disorder: Yes Depression: Yes Mania: Yes Psychosis: Yes Schizophrenia: No Personality Disorder: No Hospitalization for psychiatric illness: Yes History of Electroconvulsive Shock Therapy: No Prior Suicide Attempts: Yes  Medical History; Patient has multiple medical problems.  He has hypertension, diabetes mellitus ,  asthma, GERD, chronic pain, degenerative disc disease, fibromyalgia, low testosterone, hyperlipidemia and allergic rhinitis.  His primary care physician is Dr. Conley Canal and he see Dr. Dwyane Dee for her diabetes.  Review of Systems: Psychiatric: Agitation: No Hallucination: No Depressed Mood: Yes Insomnia: Yes Hypersomnia: No Altered Concentration: No Feels Worthless: No Grandiose Ideas: No Belief In Special Powers: No New/Increased Substance Abuse: No Compulsions:  No  Neurologic: Headache: No Seizure: No Paresthesias: No    Outpatient Encounter Prescriptions as of 09/28/2014  Medication Sig  . Testosterone 75 MG PLLT Inject 150 mg into the skin.  Marland Kitchen albuterol (PROVENTIL HFA;VENTOLIN HFA) 108 (90 BASE) MCG/ACT inhaler Inhale 2 puffs into the lungs every 6 (six) hours as needed for wheezing or shortness of breath.  Marland Kitchen augmented betamethasone dipropionate (DIPROLENE-AF) 0.05 % cream Apply 1 application topically 2 (two) times daily as needed. rash  . azelastine (ASTELIN) 0.1 % nasal spray Place 2 sprays into both nostrils 2 (two) times daily. Use in each nostril as directed  . cetirizine (ZYRTEC) 10 MG tablet Take 1 tablet (10 mg total) by mouth daily. For allergies  . DULoxetine (CYMBALTA) 60 MG capsule Take 1 capsule (60 mg total) by mouth 2 (two) times daily.  . hydrOXYzine (ATARAX/VISTARIL) 50 MG tablet Take 1 tablet (50 mg total) by mouth daily as needed for anxiety.  . Insulin Glargine (LANTUS SOLOSTAR) 100 UNIT/ML Solostar Pen Inject 30 Units into the skin at bedtime.  . lamoTRIgine (LAMICTAL) 200 MG tablet Take 1 tablet (200 mg total) by mouth daily. Take in the morning for mood control.  . metFORMIN (GLUCOPHAGE) 1000 MG tablet Take 1 tablet (1,000 mg total) by mouth 2 (two) times daily with a meal.  . Multiple Vitamin (MULTIVITAMIN WITH MINERALS) TABS tablet Take 1 tablet by mouth daily.  Marland Kitchen omega-3 acid ethyl esters (LOVAZA) 1 G capsule Take 4 capsules (4 g total) by mouth daily. For high cholesterol/fats  . pioglitazone (ACTOS) 30 MG tablet Take 1 tablet (30 mg total) by mouth daily.  . simvastatin (ZOCOR) 40 MG tablet Take 1 tablet (40 mg total) by mouth at bedtime. For high cholesterol  . ziprasidone (GEODON) 20 MG capsule Take 1-2 capsule at bed time  . [DISCONTINUED] hydrOXYzine (ATARAX/VISTARIL) 50 MG tablet Take 50 mg by mouth daily as needed for anxiety.  . [DISCONTINUED] lamoTRIgine (LAMICTAL) 100 MG tablet Take 1 tablet (100 mg total)  by mouth daily. Take in the morning for mood control.  . [DISCONTINUED] lamoTRIgine (LAMICTAL) 25 MG tablet Take 3 tablets (75 mg total) by mouth at bedtime.  . [DISCONTINUED] ondansetron (ZOFRAN) 4 MG tablet Take 1 tablet (4 mg total) by mouth every 8 (eight) hours as needed for nausea or vomiting.  . [DISCONTINUED] QUEtiapine (SEROQUEL) 100 MG tablet Take 1 tablet (100 mg total) by mouth at bedtime.  . [DISCONTINUED] traZODone (DESYREL) 100 MG tablet Take 1 tablet (100 mg total) by mouth at bedtime.    Recent Results (from the past 2160 hour(s))  Basic metabolic panel     Status: Abnormal   Collection Time: 08/11/14 12:02 PM  Result Value Ref Range   Sodium 126 (L) 135 - 145 mEq/L   Potassium 4.3 3.5 - 5.1 mEq/L   Chloride 92 (L) 96 - 112 mEq/L   CO2 26 19 - 32 mEq/L   Glucose, Bld 174 (H) 70 - 99 mg/dL   BUN 6 6 - 23 mg/dL   Creatinine, Ser 0.8 0.4 - 1.5 mg/dL   Calcium 9.5 8.4 -  10.5 mg/dL   GFR 120.88 >60.00 mL/min  Urinalysis     Status: Abnormal   Collection Time: 08/11/14 12:02 PM  Result Value Ref Range   Color, Urine YELLOW Yellow;Lt. Yellow   APPearance CLEAR Clear   Specific Gravity, Urine <=1.005 (A) 1.000 - 1.030   pH 6.5 5.0 - 8.0   Total Protein, Urine NEGATIVE Negative   Urine Glucose NEGATIVE Negative   Ketones, ur 40 (A) Negative   Bilirubin Urine NEGATIVE Negative   Hgb urine dipstick NEGATIVE Negative   Urobilinogen, UA 0.2 0.0 - 1.0   Leukocytes, UA NEGATIVE Negative   Nitrite NEGATIVE Negative  Hepatic function panel     Status: None   Collection Time: 08/11/14  4:20 PM  Result Value Ref Range   Total Bilirubin 0.7 0.2 - 1.2 mg/dL   Bilirubin, Direct 0.1 0.0 - 0.3 mg/dL   Alkaline Phosphatase 78 39 - 117 U/L   AST 16 0 - 37 U/L   ALT 17 0 - 53 U/L   Total Protein 7.8 6.0 - 8.3 g/dL   Albumin 4.5 3.5 - 5.2 g/dL  Comprehensive metabolic panel     Status: Abnormal   Collection Time: 08/12/14 10:45 AM  Result Value Ref Range   Sodium 131 (L) 137 -  147 mEq/L   Potassium 4.1 3.7 - 5.3 mEq/L   Chloride 90 (L) 96 - 112 mEq/L   CO2 25 19 - 32 mEq/L   Glucose, Bld 166 (H) 70 - 99 mg/dL   BUN 7 6 - 23 mg/dL   Creatinine, Ser 0.71 0.50 - 1.35 mg/dL   Calcium 10.2 8.4 - 10.5 mg/dL   Total Protein 8.3 6.0 - 8.3 g/dL   Albumin 4.7 3.5 - 5.2 g/dL   AST 17 0 - 37 U/L   ALT 14 0 - 53 U/L   Alkaline Phosphatase 100 39 - 117 U/L   Total Bilirubin 0.7 0.3 - 1.2 mg/dL   GFR calc non Af Amer >90 >90 mL/min   GFR calc Af Amer >90 >90 mL/min    Comment: (NOTE) The eGFR has been calculated using the CKD EPI equation. This calculation has not been validated in all clinical situations. eGFR's persistently <90 mL/min signify possible Chronic Kidney Disease.   Anion gap 16 (H) 5 - 15  Urinalysis, Routine w reflex microscopic     Status: Abnormal   Collection Time: 08/12/14 11:15 AM  Result Value Ref Range   Color, Urine YELLOW YELLOW   APPearance CLEAR CLEAR   Specific Gravity, Urine <1.005 (L) 1.005 - 1.030   pH 6.5 5.0 - 8.0   Glucose, UA NEGATIVE NEGATIVE mg/dL   Hgb urine dipstick NEGATIVE NEGATIVE   Bilirubin Urine NEGATIVE NEGATIVE   Ketones, ur 15 (A) NEGATIVE mg/dL   Protein, ur NEGATIVE NEGATIVE mg/dL   Urobilinogen, UA 0.2 0.0 - 1.0 mg/dL   Nitrite NEGATIVE NEGATIVE   Leukocytes, UA NEGATIVE NEGATIVE    Comment: MICROSCOPIC NOT DONE ON URINES WITH NEGATIVE PROTEIN, BLOOD, LEUKOCYTES, NITRITE, OR GLUCOSE <1000 mg/dL.  Osmolality     Status: None   Collection Time: 08/15/14  8:24 AM  Result Value Ref Range   Osmolality 286 275 - 300 mOsm/kg  Osmolality, urine     Status: Abnormal   Collection Time: 08/15/14  8:24 AM  Result Value Ref Range   Osmolality, Ur 217 (L) 390 - 1090 mOsm/kg  Sodium, urine, random     Status: None   Collection Time: 08/15/14  8:24 AM  Result Value Ref Range   Sodium, Ur 28 mEq/L  Basic metabolic panel     Status: Abnormal   Collection Time: 08/15/14  8:36 AM  Result Value Ref Range   Sodium 133 (L)  135 - 145 mEq/L   Potassium 4.2 3.5 - 5.1 mEq/L   Chloride 99 96 - 112 mEq/L   CO2 26 19 - 32 mEq/L   Glucose, Bld 287 (H) 70 - 99 mg/dL   BUN 7 6 - 23 mg/dL   Creatinine, Ser 0.9 0.4 - 1.5 mg/dL   Calcium 9.5 8.4 - 10.5 mg/dL   GFR 98.18 >60.00 mL/min  Hemoglobin A1c     Status: Abnormal   Collection Time: 08/17/14  9:30 AM  Result Value Ref Range   Hgb A1c MFr Bld 8.5 (H) 4.6 - 6.5 %    Comment: Glycemic Control Guidelines for People with Diabetes:Non Diabetic:  <6%Goal of Therapy: <7%Additional Action Suggested:  >8%   Glucose, random     Status: Abnormal   Collection Time: 08/17/14  9:30 AM  Result Value Ref Range   Glucose, Bld 248 (H) 70 - 99 mg/dL  Urine Drug Screen     Status: Abnormal   Collection Time: 09/18/14  9:00 PM  Result Value Ref Range   Opiates NONE DETECTED NONE DETECTED   Cocaine POSITIVE (A) NONE DETECTED   Benzodiazepines NONE DETECTED NONE DETECTED   Amphetamines NONE DETECTED NONE DETECTED   Tetrahydrocannabinol NONE DETECTED NONE DETECTED   Barbiturates NONE DETECTED NONE DETECTED    Comment:        DRUG SCREEN FOR MEDICAL PURPOSES ONLY.  IF CONFIRMATION IS NEEDED FOR ANY PURPOSE, NOTIFY LAB WITHIN 5 DAYS.        LOWEST DETECTABLE LIMITS FOR URINE DRUG SCREEN Drug Class       Cutoff (ng/mL) Amphetamine      1000 Barbiturate      200 Benzodiazepine   846 Tricyclics       962 Opiates          300 Cocaine          300 THC              50   Acetaminophen level     Status: None   Collection Time: 09/18/14  9:28 PM  Result Value Ref Range   Acetaminophen (Tylenol), Serum <15.0 10 - 30 ug/mL    Comment:        THERAPEUTIC CONCENTRATIONS VARY SIGNIFICANTLY. A RANGE OF 10-30 ug/mL MAY BE AN EFFECTIVE CONCENTRATION FOR MANY PATIENTS. HOWEVER, SOME ARE BEST TREATED AT CONCENTRATIONS OUTSIDE THIS RANGE. ACETAMINOPHEN CONCENTRATIONS >150 ug/mL AT 4 HOURS AFTER INGESTION AND >50 ug/mL AT 12 HOURS AFTER INGESTION ARE OFTEN ASSOCIATED WITH  TOXIC REACTIONS.   CBC     Status: Abnormal   Collection Time: 09/18/14  9:28 PM  Result Value Ref Range   WBC 11.8 (H) 4.0 - 10.5 K/uL   RBC 4.33 4.22 - 5.81 MIL/uL   Hemoglobin 13.1 13.0 - 17.0 g/dL   HCT 37.2 (L) 39.0 - 52.0 %   MCV 85.9 78.0 - 100.0 fL   MCH 30.3 26.0 - 34.0 pg   MCHC 35.2 30.0 - 36.0 g/dL   RDW 13.5 11.5 - 15.5 %   Platelets 374 150 - 400 K/uL  Comprehensive metabolic panel     Status: Abnormal   Collection Time: 09/18/14  9:28 PM  Result Value Ref Range   Sodium 134 (L)  137 - 147 mEq/L   Potassium 4.1 3.7 - 5.3 mEq/L   Chloride 93 (L) 96 - 112 mEq/L   CO2 24 19 - 32 mEq/L   Glucose, Bld 141 (H) 70 - 99 mg/dL   BUN 3 (L) 6 - 23 mg/dL   Creatinine, Ser 0.57 0.50 - 1.35 mg/dL   Calcium 9.8 8.4 - 10.5 mg/dL   Total Protein 7.3 6.0 - 8.3 g/dL   Albumin 3.9 3.5 - 5.2 g/dL   AST 17 0 - 37 U/L   ALT 18 0 - 53 U/L   Alkaline Phosphatase 75 39 - 117 U/L   Total Bilirubin 0.3 0.3 - 1.2 mg/dL   GFR calc non Af Amer >90 >90 mL/min   GFR calc Af Amer >90 >90 mL/min    Comment: (NOTE) The eGFR has been calculated using the CKD EPI equation. This calculation has not been validated in all clinical situations. eGFR's persistently <90 mL/min signify possible Chronic Kidney Disease.    Anion gap 17 (H) 5 - 15  Ethanol (ETOH)     Status: Abnormal   Collection Time: 09/18/14  9:28 PM  Result Value Ref Range   Alcohol, Ethyl (B) 66 (H) 0 - 11 mg/dL    Comment:        LOWEST DETECTABLE LIMIT FOR SERUM ALCOHOL IS 11 mg/dL FOR MEDICAL PURPOSES ONLY   Salicylate level     Status: Abnormal   Collection Time: 09/18/14  9:28 PM  Result Value Ref Range   Salicylate Lvl <8.4 (L) 2.8 - 20.0 mg/dL  POC CBG, ED     Status: Abnormal   Collection Time: 09/18/14  9:59 PM  Result Value Ref Range   Glucose-Capillary 141 (H) 70 - 99 mg/dL  CBG monitoring, ED     Status: Abnormal   Collection Time: 09/19/14  8:18 AM  Result Value Ref Range   Glucose-Capillary 112 (H) 70 - 99  mg/dL  CBG monitoring, ED     Status: Abnormal   Collection Time: 09/19/14 12:42 PM  Result Value Ref Range   Glucose-Capillary 215 (H) 70 - 99 mg/dL   Comment 1 Notify RN   CBG monitoring, ED     Status: Abnormal   Collection Time: 09/19/14  5:28 PM  Result Value Ref Range   Glucose-Capillary 101 (H) 70 - 99 mg/dL   Comment 1 Notify RN   CBG monitoring, ED     Status: Abnormal   Collection Time: 09/19/14  8:39 PM  Result Value Ref Range   Glucose-Capillary 243 (H) 70 - 99 mg/dL  Glucose, capillary     Status: Abnormal   Collection Time: 09/19/14 11:25 PM  Result Value Ref Range   Glucose-Capillary 231 (H) 70 - 99 mg/dL  Glucose, capillary     Status: Abnormal   Collection Time: 09/20/14  6:22 AM  Result Value Ref Range   Glucose-Capillary 152 (H) 70 - 99 mg/dL  Glucose, capillary     Status: Abnormal   Collection Time: 09/20/14 11:44 AM  Result Value Ref Range   Glucose-Capillary 285 (H) 70 - 99 mg/dL   Comment 1 Notify RN   Glucose, capillary     Status: Abnormal   Collection Time: 09/20/14  4:31 PM  Result Value Ref Range   Glucose-Capillary 267 (H) 70 - 99 mg/dL   Comment 1 Notify RN   Glucose, capillary     Status: Abnormal   Collection Time: 09/20/14  9:27 PM  Result Value Ref  Range   Glucose-Capillary 216 (H) 70 - 99 mg/dL  Glucose, capillary     Status: Abnormal   Collection Time: 09/21/14  5:55 AM  Result Value Ref Range   Glucose-Capillary 257 (H) 70 - 99 mg/dL  TSH     Status: None   Collection Time: 09/21/14  6:30 AM  Result Value Ref Range   TSH 1.030 0.350 - 4.500 uIU/mL    Comment: Performed at [facility]  Glucose, capillary     Status: Abnormal   Collection Time: 09/21/14 11:58 AM  Result Value Ref Range   Glucose-Capillary 297 (H) 70 - 99 mg/dL   Comment 1 Notify RN   Glucose, capillary     Status: Abnormal   Collection Time: 09/21/14  4:26 PM  Result Value Ref Range   Glucose-Capillary 369 (H) 70 - 99 mg/dL  Glucose, capillary      Status: Abnormal   Collection Time: 09/21/14  8:38 PM  Result Value Ref Range   Glucose-Capillary 159 (H) 70 - 99 mg/dL  Glucose, capillary     Status: Abnormal   Collection Time: 09/22/14  5:49 AM  Result Value Ref Range   Glucose-Capillary 325 (H) 70 - 99 mg/dL  Glucose, capillary     Status: Abnormal   Collection Time: 09/22/14 12:08 PM  Result Value Ref Range   Glucose-Capillary 216 (H) 70 - 99 mg/dL   Comment 1 Notify RN   Glucose, capillary     Status: Abnormal   Collection Time: 09/22/14  5:14 PM  Result Value Ref Range   Glucose-Capillary 229 (H) 70 - 99 mg/dL   Comment 1 Notify RN   Glucose, capillary     Status: Abnormal   Collection Time: 09/22/14  9:10 PM  Result Value Ref Range   Glucose-Capillary 310 (H) 70 - 99 mg/dL  Glucose, capillary     Status: Abnormal   Collection Time: 09/23/14  6:12 AM  Result Value Ref Range   Glucose-Capillary 221 (H) 70 - 99 mg/dL  Glucose, capillary     Status: Abnormal   Collection Time: 09/23/14 11:45 AM  Result Value Ref Range   Glucose-Capillary 307 (H) 70 - 99 mg/dL   Comment 1 Notify RN   Glucose, capillary     Status: Abnormal   Collection Time: 09/23/14  4:51 PM  Result Value Ref Range   Glucose-Capillary 469 (H) 70 - 99 mg/dL   Comment 1 Notify RN   Glucose, capillary     Status: Abnormal   Collection Time: 09/23/14  6:18 PM  Result Value Ref Range   Glucose-Capillary 253 (H) 70 - 99 mg/dL  Glucose, capillary     Status: Abnormal   Collection Time: 09/23/14  8:33 PM  Result Value Ref Range   Glucose-Capillary 128 (H) 70 - 99 mg/dL  Glucose, capillary     Status: Abnormal   Collection Time: 09/24/14  6:03 AM  Result Value Ref Range   Glucose-Capillary 249 (H) 70 - 99 mg/dL  Glucose, capillary     Status: Abnormal   Collection Time: 09/24/14 11:52 AM  Result Value Ref Range   Glucose-Capillary 318 (H) 70 - 99 mg/dL   Comment 1 Notify RN   Glucose, capillary     Status: Abnormal   Collection Time: 09/24/14  2:33  PM  Result Value Ref Range   Glucose-Capillary 235 (H) 70 - 99 mg/dL  Glucose, capillary     Status: Abnormal   Collection Time: 09/24/14  4:52  PM  Result Value Ref Range   Glucose-Capillary 268 (H) 70 - 99 mg/dL  Glucose, capillary     Status: Abnormal   Collection Time: 09/24/14  9:21 PM  Result Value Ref Range   Glucose-Capillary 243 (H) 70 - 99 mg/dL  Glucose, capillary     Status: Abnormal   Collection Time: 09/25/14  5:51 AM  Result Value Ref Range   Glucose-Capillary 219 (H) 70 - 99 mg/dL  Glucose, capillary     Status: Abnormal   Collection Time: 09/25/14 11:14 AM  Result Value Ref Range   Glucose-Capillary 409 (H) 70 - 99 mg/dL   Comment 1 Notify RN   Glucose, capillary     Status: Abnormal   Collection Time: 09/25/14  1:03 PM  Result Value Ref Range   Glucose-Capillary 316 (H) 70 - 99 mg/dL   Comment 1 Notify RN   Glucose, capillary     Status: Abnormal   Collection Time: 09/25/14  4:42 PM  Result Value Ref Range   Glucose-Capillary 362 (H) 70 - 99 mg/dL   Comment 1 Notify RN   Glucose, capillary     Status: Abnormal   Collection Time: 09/25/14  9:24 PM  Result Value Ref Range   Glucose-Capillary 293 (H) 70 - 99 mg/dL   Comment 1 Notify RN   Glucose, capillary     Status: Abnormal   Collection Time: 09/26/14  6:06 AM  Result Value Ref Range   Glucose-Capillary 216 (H) 70 - 99 mg/dL  Glucose, capillary     Status: Abnormal   Collection Time: 09/26/14 12:02 PM  Result Value Ref Range   Glucose-Capillary 327 (H) 70 - 99 mg/dL  Glucose, capillary     Status: Abnormal   Collection Time: 09/26/14  4:52 PM  Result Value Ref Range   Glucose-Capillary 254 (H) 70 - 99 mg/dL  Glucose, capillary     Status: Abnormal   Collection Time: 09/26/14  8:43 PM  Result Value Ref Range   Glucose-Capillary 236 (H) 70 - 99 mg/dL  Glucose, capillary     Status: Abnormal   Collection Time: 09/27/14  6:05 AM  Result Value Ref Range   Glucose-Capillary 222 (H) 70 - 99 mg/dL   Basic metabolic panel     Status: Abnormal   Collection Time: 09/27/14  6:15 AM  Result Value Ref Range   Sodium 129 (L) 137 - 147 mEq/L   Potassium 4.4 3.7 - 5.3 mEq/L   Chloride 91 (L) 96 - 112 mEq/L   CO2 25 19 - 32 mEq/L   Glucose, Bld 262 (H) 70 - 99 mg/dL   BUN 6 6 - 23 mg/dL   Creatinine, Ser 0.65 0.50 - 1.35 mg/dL   Calcium 9.5 8.4 - 10.5 mg/dL   GFR calc non Af Amer >90 >90 mL/min   GFR calc Af Amer >90 >90 mL/min    Comment: (NOTE) The eGFR has been calculated using the CKD EPI equation. This calculation has not been validated in all clinical situations. eGFR's persistently <90 mL/min signify possible Chronic Kidney Disease.    Anion gap 13 5 - 15    Comment: Performed at [facility]  Glucose, capillary     Status: Abnormal   Collection Time: 09/27/14 11:39 AM  Result Value Ref Range   Glucose-Capillary 277 (H) 70 - 99 mg/dL      Physical Exam: Constitutional:  BP 127/82 mmHg  Pulse 109  Ht '5\' 11"'  (1.803 m)  Wt  193 lb 3.2 oz (87.635 kg)  BMI 26.96 kg/m2  Musculoskeletal: Strength & Muscle Tone: within normal limits Gait & Station: normal Patient leans: N/A  Mental Status Examination;  Patient is casually dressed and fairly groomed.  He maintains fair eye contact.  His speech is slow but coherent.  He described his mood anxious and his affect is constricted.  He denies any paranoia , delusions or any excessive thoughts.  He denies any active or passive suicidal thoughts or homicidal thoughts.  His attention and concentration is okay.  His thought process is logical and goal-directed.  His psychomotor activity is normal.  His fund of knowledge is average.  He is alert and oriented x3.  There were no tremors or shakes present.  His insight judgment and impulse control is good.   Established Problem, Stable/Improving (1), Review of Psycho-Social Stressors (1), Review or order clinical lab tests (1), Review of Last Therapy Session (1), Review of  Medication Regimen & Side Effects (2) and Review of New Medication or Change in Dosage (2)  Assessment: Axis I: Bipolar disorder NOS, rule out posttraumatic stress disorder  Axis II: Deferred  Axis III:  Past Medical History  Diagnosis Date  . Allergy   . Depression   . GERD (gastroesophageal reflux disease)   . Hyperlipidemia   . Chronic pain disorder     Sees [facility]  . Urinary incontinence     detrusor instability  . Hypogonadism   . Diabetes mellitus     Type II  . Chronic neck pain   . Bipolar depression     Axis IV: Mild to moderate   Plan:  I review his records from behavioral [facility] including his recent blood work, current medication.  His hemoglobin A1c is high.  His UDS is positive for cocaine and his blood alcohol level was 66.  He is not drinking or using drugs since he released from the hospital.  He is concerned about his blood sugar.  He has a blood work today.  I recommended to discontinue Seroquel since his sugar has been increased since he is taking Seroquel.  He had never tried Geodon.  I will try Geodon 20 to 40 mg at bedtime.  Increase Lamictal 200 mg daily and continue Vistaril 50 mg as needed for severe anxiety attack.  Patient's Cymbalta was also increased during hospitalization.  He is taking 60 mg twice a day.  He is tolerating the medication and denies any tremors, shakes or any EPS.  Strongly encouraged to keep appointment with her therapist [name] for coping and social skills.  At this time he does not have any rash or itching.  I will see him again in 3 weeks.  Recommended to call us back if he has any question or any concern. Time spent 25 minutes.  More than 50% of the time spent in psychoeducation, counseling and coordination of care.  Discuss safety plan that anytime having active suicidal thoughts or homicidal thoughts then patient need to call 911 or go to the local emergency room.  Marialena Wollen T.,  MD 09/28/2014

## 2014-09-28 NOTE — Telephone Encounter (Signed)
[facility]  Triage Call Report Triage Record Num: [id number] Operator: Lesli Albeeracey McKinney Patient Name: Cher NakaiJeffrey Teegarden Call Date & Time: 09/27/2014 5:26:44PM Patient Phone: [phone] PCP: Sandford CrazeMelissa O'Sullivan, NP Patient Gender: Male PCP Fax : [phone] Patient DOB: 03-Mar-1976 Practice Name: [facility]  Reason for Call: Pt transferred over from office staff after 5pm to evaluate what the pt is saying about his sodium levels. Pt was sent to the ED on 08/15/14 for sodium levels of 126 by M. Peggyann JubaO'Sullivan and as of this am the sodium was 129 and Chloride is 91. He states [facility] and he was in the MD office when he tried to call to check with M. O'sullivan if pt would need to be transfused for this level of sodium. RN checked [facility] and confirmed labs. RN called Dr. Lorin PicketScott who will research the chart and call the pt.  Protocol(s) Used: PCP Calls, No Triage (Adult) Recommended Outcome per Protocol: Call Provider within 24 Hours Reason for Outcome: Lab calling with test results Care Advice: ~

## 2014-09-28 NOTE — Telephone Encounter (Signed)
Received a call from call a nurse (09/27/14 - 1840) regarding Mr Hollenbach's sodium level.  Mr [name] was recently admitted [facility]).  Prior to his discharge, he had a sodium lab checked - 129 with blood sugar 262).  Spoke to pt and he states that he was waiting to hear if he was supposed to do anything more tonight about the sodium level.  Reports back in 10/15, he had to go to the ER for IV saline.  He denies any vomiting.  States he is eating and drinking well.  Denies alcohol intake.  Does report occasional nausea.  Other than leg cramps, reports no other problems.  I explained to him in detail that the sodium would need to be followed.  Given his current symptoms and given corrected sodium level (with his sugar level 262) is 130, I do not feel he needs to go to ER now.  Explained if had any acute change in symptoms or problems tonight, to call or be evaluated.  I also informed him that I would contact his primary caregiver and notify her of lab results and need for f/u labs 09/28/14.  Would also see if could get a magnesium level checked (with the follow up sodium), given his leg cramps.  Recent potassium wnl.  Pt very comfortable with this plan.

## 2014-09-28 NOTE — Telephone Encounter (Signed)
pls contact pt to arrange lab visit today. Needs BMET and mag level please. Dx hyponatremia. thanks

## 2014-09-29 ENCOUNTER — Ambulatory Visit (INDEPENDENT_AMBULATORY_CARE_PROVIDER_SITE_OTHER): Payer: PRIVATE HEALTH INSURANCE | Admitting: Licensed Clinical Social Worker

## 2014-09-29 DIAGNOSIS — F4322 Adjustment disorder with anxiety: Secondary | ICD-10-CM

## 2014-10-02 ENCOUNTER — Other Ambulatory Visit: Payer: Self-pay

## 2014-10-02 ENCOUNTER — Ambulatory Visit: Payer: Self-pay | Admitting: Family

## 2014-10-02 ENCOUNTER — Ambulatory Visit (INDEPENDENT_AMBULATORY_CARE_PROVIDER_SITE_OTHER): Payer: PRIVATE HEALTH INSURANCE | Admitting: Family

## 2014-10-02 ENCOUNTER — Encounter: Payer: Self-pay | Admitting: Family

## 2014-10-02 VITALS — BP 146/89 | HR 100 | Temp 98.2°F | Resp 16 | Ht 72.0 in | Wt 192.0 lb

## 2014-10-02 DIAGNOSIS — IMO0002 Reserved for concepts with insufficient information to code with codable children: Secondary | ICD-10-CM

## 2014-10-02 DIAGNOSIS — E1165 Type 2 diabetes mellitus with hyperglycemia: Secondary | ICD-10-CM

## 2014-10-02 DIAGNOSIS — F332 Major depressive disorder, recurrent severe without psychotic features: Secondary | ICD-10-CM

## 2014-10-02 DIAGNOSIS — E871 Hypo-osmolality and hyponatremia: Secondary | ICD-10-CM

## 2014-10-02 NOTE — Progress Notes (Signed)
Pre visit review using our clinic review tool, if applicable. No additional management support is needed unless otherwise documented below in the visit note. 

## 2014-10-02 NOTE — Patient Instructions (Signed)
Please follow up in 3 months. Keep upcoming appointments with psychiatry. Continue AA meetings. It is important that you quit smoking. Reschedule your upcoming appointment with Dr. Lucianne MussKumar.

## 2014-10-02 NOTE — Progress Notes (Signed)
Patient Discharge Instructions:  After Visit Summary (AVS):   Faxed to:  10/02/14 Discharge Summary Note:   Faxed to:  10/02/14 Psychiatric Admission Assessment Note:   Faxed to:  10/02/14 Suicide Risk Assessment - Discharge Assessment:   Faxed to:  10/02/14 Faxed/Sent to the Next Level Care provider:  10/02/14 Next Level Care Provider Has Access to the EMR, 10/02/14  Faxed to [facility] @ [phone] Records provided to [facility] via [facility]/Epic access.  Jerelene ReddenSheena E [place], 10/02/2014, 1:50 PM

## 2014-10-02 NOTE — Progress Notes (Signed)
Subjective:    Patient ID: Timothy HayJeffrey L [name], male    DOB: [birth year], 38 y.o.   MRN: [id number]  HPI  [name] is a 38 year old male who presents today for hospital follow up.  He was admitted to [facility] 11/10-11/18.  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 Apparently recently split up from his wife.  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  Reports that he is feeling better. He just started a new job and he is excited about it.   Reports that his sugars were high on seroquel. Sugar was 151 fasting this AM. Was 173 this AM.    Lab Results  Component Value Date   HGBA1C 8.5* 08/17/2014   HGBA1C 10.4* 10/03/2013   HGBA1C 9.5* 06/14/2013   Lab Results  Component Value Date   MICROALBUR 1.4 11/30/2013   LDLCALC 74 02/04/2012   CREATININE 0.8 09/28/2014   Hyponatremia- he was noted to have hyponatremia while hospitalized.  Review of Systems    see HPI  Past Medical History  Diagnosis Date  . Allergy   . Depression   . GERD (gastroesophageal reflux disease)   . Hyperlipidemia   . Chronic pain disorder     Sees [facility]  . Urinary incontinence     detrusor instability  . Hypogonadism   . Diabetes mellitus     Type II  . Chronic neck pain   . Bipolar depression     History   Social History  . Marital Status: Married    Spouse Name: N/A    Number of Children: 2  . Years of Education: N/A   Occupational History  . Disabled     chronic pain   Social History Main Topics  . Smoking status: Current Every Day Smoker -- 2.00 packs/day    Types: Cigarettes    Last Attempt to Quit: 08/29/2013  . Smokeless tobacco: Never Used  . Alcohol Use: 0.0 oz/week    0 Not specified per week     Comment: Drinking daily x1wk   . Drug Use: Yes    Special: Cocaine  . Sexual Activity: Yes    Birth Control/ Protection: None   Other Topics Concern  . Not on file   Social History Narrative    Past Surgical History  Procedure Laterality  Date  . Appendectomy    . Hernia repair    . Nasal sinus surgery  2008    Family History  Problem Relation Age of Onset  . Diabetes Mother   . Hypertension Mother   . Cirrhosis Mother   . Depression Mother   . Bipolar disorder Mother   . Arthritis Father 49    osteoarthritis  . Bipolar disorder Father   . Diabetes Sister     borderline  . Heart disease Paternal Uncle [phone]    MI  . Heart disease Maternal Grandfather     late 60's--MI  . Cancer Paternal Grandmother [phone]    lung  . Bipolar disorder Paternal Grandmother   . Heart disease Paternal Grandfather 252    MI  . Bipolar disorder Paternal Grandfather     Allergies  Allergen Reactions  . Ciprofloxacin Hives  . Hydrocodone     Rash and nausea    Current Outpatient Prescriptions on File Prior to Visit  Medication Sig Dispense Refill  . albuterol (PROVENTIL HFA;VENTOLIN HFA) 108 (90 BASE) MCG/ACT inhaler Inhale 2 puffs into the lungs every 6 (six) hours as  needed for wheezing or shortness of breath. 1 Inhaler 0  . augmented betamethasone dipropionate (DIPROLENE-AF) 0.05 % cream Apply 1 application topically 2 (two) times daily as needed. rash 30 g 0  . azelastine (ASTELIN) 0.1 % nasal spray Place 2 sprays into both nostrils 2 (two) times daily. Use in each nostril as directed 30 mL 12  . cetirizine (ZYRTEC) 10 MG tablet Take 1 tablet (10 mg total) by mouth daily. For allergies    . DULoxetine (CYMBALTA) 60 MG capsule Take 1 capsule (60 mg total) by mouth 2 (two) times daily. 60 capsule 0  . hydrOXYzine (ATARAX/VISTARIL) 50 MG tablet Take 1 tablet (50 mg total) by mouth daily as needed for anxiety. 30 tablet 0  . Insulin Glargine (LANTUS SOLOSTAR) 100 UNIT/ML Solostar Pen Inject 30 Units into the skin at bedtime. 15 mL 11  . lamoTRIgine (LAMICTAL) 200 MG tablet Take 1 tablet (200 mg total) by mouth daily. Take in the morning for mood control. 30 tablet 0  . metFORMIN (GLUCOPHAGE) 1000 MG tablet Take 1 tablet (1,000 mg total)  by mouth 2 (two) times daily with a meal.    . Multiple Vitamin (MULTIVITAMIN WITH MINERALS) TABS tablet Take 1 tablet by mouth daily.    Marland Kitchen. omega-3 acid ethyl esters (LOVAZA) 1 G capsule Take 4 capsules (4 g total) by mouth daily. For high cholesterol/fats    . pioglitazone (ACTOS) 30 MG tablet Take 1 tablet (30 mg total) by mouth daily. 30 tablet 3  . simvastatin (ZOCOR) 40 MG tablet Take 1 tablet (40 mg total) by mouth at bedtime. For high cholesterol 30 tablet 0  . Testosterone 75 MG PLLT Inject 150 mg into the skin.    Marland Kitchen. ziprasidone (GEODON) 20 MG capsule Take 1-2 capsule at bed time 60 capsule 0  . [DISCONTINUED] albuterol (PROVENTIL) 90 MCG/ACT inhaler Inhale 2 puffs into the lungs every 6 (six) hours as needed for wheezing. 17 g 12   No current facility-administered medications on file prior to visit.    BP 146/89 mmHg  Pulse 100  Temp(Src) 98.2 F (36.8 C) (Oral)  Resp 16  Ht 6' (1.829 m)  Wt 192 lb (87.091 kg)  BMI 26.03 kg/m2  SpO2 97%    Objective:   Physical Exam  Constitutional: He is oriented to person, place, and time. He appears well-developed and well-nourished. No distress.  HENT:  Head: Normocephalic and atraumatic.  Cardiovascular: Normal rate and regular rhythm.   No murmur heard. Pulmonary/Chest: Effort normal and breath sounds normal. No respiratory distress. He has no wheezes. He has no rales. He exhibits no tenderness.  Neurological: He is alert and oriented to person, place, and time.  Skin: Skin is warm and dry.  Psychiatric: He has a normal mood and affect. His behavior is normal. Judgment and thought content normal.          Assessment & Plan:

## 2014-10-02 NOTE — Telephone Encounter (Signed)
Hospital Follow up scheduled for 10/02/2014 at 6pm

## 2014-10-04 ENCOUNTER — Ambulatory Visit: Payer: PRIVATE HEALTH INSURANCE | Admitting: Licensed Clinical Social Worker

## 2014-10-08 NOTE — Assessment & Plan Note (Signed)
Last sodium improved at 133 up from 129.  Monitor.

## 2014-10-08 NOTE — Assessment & Plan Note (Signed)
Improved, he is instructed to keep his upcoming apt with psychiatry.

## 2014-10-08 NOTE — Assessment & Plan Note (Signed)
Improved  Now that he is off of seroquel.

## 2014-10-13 ENCOUNTER — Telehealth (HOSPITAL_COMMUNITY): Payer: Self-pay

## 2014-10-13 MED ORDER — TRAZODONE HCL 100 MG PO TABS: 100.0000 mg | ORAL_TABLET | Freq: Every day | ORAL | Status: DC

## 2014-10-13 NOTE — Telephone Encounter (Signed)
I returned patient's phone call.  He has not tried Geodon because he has no money and he cannot afford.  He wants to go back on trazodone 100 mg at bedtime which he was taking before.  I will discontinue Geodon and restart trazodone 100 mg at bedtime.

## 2014-10-16 ENCOUNTER — Telehealth: Payer: Self-pay | Admitting: Family

## 2014-10-16 NOTE — Telephone Encounter (Signed)
Caller name: Zaydrian Relation to pt: self Call back number: [phone] Pharmacy: [facility] on [address]  Reason for call:   Patient states that he is having another flare up in his neck and would like to know if another round of prednisone could be called in.

## 2014-10-16 NOTE — Telephone Encounter (Signed)
Responding as Efraim KaufmannMelissa out of office. He will need appointment before steroid will be given, especially with diabetes.

## 2014-10-16 NOTE — Telephone Encounter (Signed)
Informed patient of this and he states that he will call back.

## 2014-10-17 ENCOUNTER — Ambulatory Visit (INDEPENDENT_AMBULATORY_CARE_PROVIDER_SITE_OTHER): Payer: PRIVATE HEALTH INSURANCE | Admitting: Physician Assistant

## 2014-10-17 ENCOUNTER — Ambulatory Visit (INDEPENDENT_AMBULATORY_CARE_PROVIDER_SITE_OTHER): Payer: PRIVATE HEALTH INSURANCE | Admitting: Licensed Clinical Social Worker

## 2014-10-17 ENCOUNTER — Encounter: Payer: Self-pay | Admitting: Physician Assistant

## 2014-10-17 VITALS — BP 134/77 | HR 87 | Temp 98.0°F | Wt 196.0 lb

## 2014-10-17 DIAGNOSIS — M62838 Other muscle spasm: Secondary | ICD-10-CM | POA: Insufficient documentation

## 2014-10-17 DIAGNOSIS — M6248 Contracture of muscle, other site: Secondary | ICD-10-CM

## 2014-10-17 DIAGNOSIS — F4322 Adjustment disorder with anxiety: Secondary | ICD-10-CM

## 2014-10-17 MED ORDER — IBUPROFEN 800 MG PO TABS: 800.0000 mg | ORAL_TABLET | Freq: Three times a day (TID) | ORAL | Status: DC | PRN

## 2014-10-17 MED ORDER — TIZANIDINE HCL 4 MG PO TABS
4.0000 mg | ORAL_TABLET | Freq: Three times a day (TID) | ORAL | Status: DC | PRN
Start: 2014-10-17 — End: 2014-12-19

## 2014-10-17 MED ORDER — TIZANIDINE HCL 4 MG PO TABS
4.0000 mg | ORAL_TABLET | Freq: Three times a day (TID) | ORAL | Status: DC | PRN
Start: 2014-10-17 — End: 2014-10-17

## 2014-10-17 NOTE — Progress Notes (Signed)
Pre visit review using our clinic review tool, if applicable. No additional management support is needed unless otherwise documented below in the visit note. 

## 2014-10-17 NOTE — Progress Notes (Signed)
Patient presents to clinic today c/o pain and spasm of his neck over the past week.  Patient with chronic neck pain with occasional flares. Is followed by Pain Management.  Patient denies trauma or injury.  Denies radiation of pain into extremities or down his back.  Denies weakness of arms.  Has not taken anything for symptoms.  Past Medical History  Diagnosis Date  . Allergy   . Depression   . GERD (gastroesophageal reflux disease)   . Hyperlipidemia   . Chronic pain disorder     Sees [facility]  . Urinary incontinence     detrusor instability  . Hypogonadism   . Diabetes mellitus     Type II  . Chronic neck pain   . Bipolar depression     Current Outpatient Prescriptions on File Prior to Visit  Medication Sig Dispense Refill  . albuterol (PROVENTIL HFA;VENTOLIN HFA) 108 (90 BASE) MCG/ACT inhaler Inhale 2 puffs into the lungs every 6 (six) hours as needed for wheezing or shortness of breath. 1 Inhaler 0  . augmented betamethasone dipropionate (DIPROLENE-AF) 0.05 % cream Apply 1 application topically 2 (two) times daily as needed. rash 30 g 0  . azelastine (ASTELIN) 0.1 % nasal spray Place 2 sprays into both nostrils 2 (two) times daily. Use in each nostril as directed 30 mL 12  . cetirizine (ZYRTEC) 10 MG tablet Take 1 tablet (10 mg total) by mouth daily. For allergies    . DULoxetine (CYMBALTA) 60 MG capsule Take 1 capsule (60 mg total) by mouth 2 (two) times daily. 60 capsule 0  . hydrOXYzine (ATARAX/VISTARIL) 50 MG tablet Take 1 tablet (50 mg total) by mouth daily as needed for anxiety. 30 tablet 0  . Insulin Glargine (LANTUS SOLOSTAR) 100 UNIT/ML Solostar Pen Inject 30 Units into the skin at bedtime. 15 mL 11  . lamoTRIgine (LAMICTAL) 200 MG tablet Take 1 tablet (200 mg total) by mouth daily. Take in the morning for mood control. 30 tablet 0  . metFORMIN (GLUCOPHAGE) 1000 MG tablet Take 1 tablet (1,000 mg total) by mouth 2 (two) times daily with a meal.    .  pioglitazone (ACTOS) 30 MG tablet Take 1 tablet (30 mg total) by mouth daily. 30 tablet 3  . simvastatin (ZOCOR) 40 MG tablet Take 1 tablet (40 mg total) by mouth at bedtime. For high cholesterol 30 tablet 0  . Testosterone 75 MG PLLT Inject 150 mg into the skin.    Marland Kitchen traZODone (DESYREL) 100 MG tablet Take 1 tablet (100 mg total) by mouth at bedtime. 30 tablet 0  . Multiple Vitamin (MULTIVITAMIN WITH MINERALS) TABS tablet Take 1 tablet by mouth daily. (Patient not taking: Reported on 10/17/2014)    . omega-3 acid ethyl esters (LOVAZA) 1 G capsule Take 4 capsules (4 g total) by mouth daily. For high cholesterol/fats (Patient not taking: Reported on 10/17/2014)    . ziprasidone (GEODON) 20 MG capsule Take 1-2 capsule at bed time (Patient not taking: Reported on 10/17/2014) 60 capsule 0  . [DISCONTINUED] albuterol (PROVENTIL) 90 MCG/ACT inhaler Inhale 2 puffs into the lungs every 6 (six) hours as needed for wheezing. 17 g 12   No current facility-administered medications on file prior to visit.    Allergies  Allergen Reactions  . Ciprofloxacin Hives  . Hydrocodone     Rash and nausea    Family History  Problem Relation Age of Onset  . Diabetes Mother   . Hypertension Mother   .  Cirrhosis Mother   . Depression Mother   . Bipolar disorder Mother   . Arthritis Father 19    osteoarthritis  . Bipolar disorder Father   . Diabetes Sister     borderline  . Heart disease Paternal Uncle 23    MI  . Heart disease Maternal Grandfather     late 60's--MI  . Cancer Paternal Grandmother 65    lung  . Bipolar disorder Paternal Grandmother   . Heart disease Paternal Grandfather 8    MI  . Bipolar disorder Paternal Grandfather     History   Social History  . Marital Status: Married    Spouse Name: N/A    Number of Children: 2  . Years of Education: N/A   Occupational History  . Disabled     chronic pain   Social History Main Topics  . Smoking status: Current Every Day Smoker -- 2.00  packs/day    Types: Cigarettes    Last Attempt to Quit: 08/29/2013  . Smokeless tobacco: Never Used  . Alcohol Use: 0.0 oz/week    0 Not specified per week     Comment: Drinking daily x1wk   . Drug Use: Yes    Special: Cocaine  . Sexual Activity: Yes    Birth Control/ Protection: None   Other Topics Concern  . None   Social History Narrative    Review of Systems - See HPI.  All other ROS are negative.  BP 134/77 mmHg  Pulse 87  Temp(Src) 98 F (36.7 C)  Wt 196 lb (88.905 kg)  SpO2 99%  Physical Exam  Constitutional: He is oriented to person, place, and time and well-developed, well-nourished, and in no distress.  HENT:  Head: Normocephalic and atraumatic.  Eyes: Conjunctivae are normal. Pupils are equal, round, and reactive to light.  Neck: Normal range of motion. Neck supple. Muscular tenderness present. No spinous process tenderness present.  Cardiovascular: Normal rate, regular rhythm, normal heart sounds and intact distal pulses.   Pulmonary/Chest: Effort normal and breath sounds normal. No respiratory distress. He has no wheezes. He has no rales. He exhibits no tenderness.  Neurological: He is alert and oriented to person, place, and time.  Skin: Skin is warm and dry.  Psychiatric: Affect normal.  Vitals reviewed.   Recent Results (from the past 2160 hour(s))  Basic metabolic panel     Status: Abnormal   Collection Time: 08/11/14 12:02 PM  Result Value Ref Range   Sodium 126 (L) 135 - 145 mEq/L   Potassium 4.3 3.5 - 5.1 mEq/L   Chloride 92 (L) 96 - 112 mEq/L   CO2 26 19 - 32 mEq/L   Glucose, Bld 174 (H) 70 - 99 mg/dL   BUN 6 6 - 23 mg/dL   Creatinine, Ser 0.8 0.4 - 1.5 mg/dL   Calcium 9.5 8.4 - 10.5 mg/dL   GFR 120.88 >60.00 mL/min  Urinalysis     Status: Abnormal   Collection Time: 08/11/14 12:02 PM  Result Value Ref Range   Color, Urine YELLOW Yellow;Lt. Yellow   APPearance CLEAR Clear   Specific Gravity, Urine <=1.005 (A) 1.000 - 1.030   pH 6.5 5.0  - 8.0   Total Protein, Urine NEGATIVE Negative   Urine Glucose NEGATIVE Negative   Ketones, ur 40 (A) Negative   Bilirubin Urine NEGATIVE Negative   Hgb urine dipstick NEGATIVE Negative   Urobilinogen, UA 0.2 0.0 - 1.0   Leukocytes, UA NEGATIVE Negative   Nitrite NEGATIVE Negative  Hepatic function panel     Status: None   Collection Time: 08/11/14  4:20 PM  Result Value Ref Range   Total Bilirubin 0.7 0.2 - 1.2 mg/dL   Bilirubin, Direct 0.1 0.0 - 0.3 mg/dL   Alkaline Phosphatase 78 39 - 117 U/L   AST 16 0 - 37 U/L   ALT 17 0 - 53 U/L   Total Protein 7.8 6.0 - 8.3 g/dL   Albumin 4.5 3.5 - 5.2 g/dL  Comprehensive metabolic panel     Status: Abnormal   Collection Time: 08/12/14 10:45 AM  Result Value Ref Range   Sodium 131 (L) 137 - 147 mEq/L   Potassium 4.1 3.7 - 5.3 mEq/L   Chloride 90 (L) 96 - 112 mEq/L   CO2 25 19 - 32 mEq/L   Glucose, Bld 166 (H) 70 - 99 mg/dL   BUN 7 6 - 23 mg/dL   Creatinine, Ser 0.71 0.50 - 1.35 mg/dL   Calcium 10.2 8.4 - 10.5 mg/dL   Total Protein 8.3 6.0 - 8.3 g/dL   Albumin 4.7 3.5 - 5.2 g/dL   AST 17 0 - 37 U/L   ALT 14 0 - 53 U/L   Alkaline Phosphatase 100 39 - 117 U/L   Total Bilirubin 0.7 0.3 - 1.2 mg/dL   GFR calc non Af Amer >90 >90 mL/min   GFR calc Af Amer >90 >90 mL/min    Comment: (NOTE) The eGFR has been calculated using the CKD EPI equation. This calculation has not been validated in all clinical situations. eGFR's persistently <90 mL/min signify possible Chronic Kidney Disease.   Anion gap 16 (H) 5 - 15  Urinalysis, Routine w reflex microscopic     Status: Abnormal   Collection Time: 08/12/14 11:15 AM  Result Value Ref Range   Color, Urine YELLOW YELLOW   APPearance CLEAR CLEAR   Specific Gravity, Urine <1.005 (L) 1.005 - 1.030   pH 6.5 5.0 - 8.0   Glucose, UA NEGATIVE NEGATIVE mg/dL   Hgb urine dipstick NEGATIVE NEGATIVE   Bilirubin Urine NEGATIVE NEGATIVE   Ketones, ur 15 (A) NEGATIVE mg/dL   Protein, ur NEGATIVE NEGATIVE  mg/dL   Urobilinogen, UA 0.2 0.0 - 1.0 mg/dL   Nitrite NEGATIVE NEGATIVE   Leukocytes, UA NEGATIVE NEGATIVE    Comment: MICROSCOPIC NOT DONE ON URINES WITH NEGATIVE PROTEIN, BLOOD, LEUKOCYTES, NITRITE, OR GLUCOSE <1000 mg/dL.  Osmolality     Status: None   Collection Time: 08/15/14  8:24 AM  Result Value Ref Range   Osmolality 286 275 - 300 mOsm/kg  Osmolality, urine     Status: Abnormal   Collection Time: 08/15/14  8:24 AM  Result Value Ref Range   Osmolality, Ur 217 (L) 390 - 1090 mOsm/kg  Sodium, urine, random     Status: None   Collection Time: 08/15/14  8:24 AM  Result Value Ref Range   Sodium, Ur 28 mEq/L  Basic metabolic panel     Status: Abnormal   Collection Time: 08/15/14  8:36 AM  Result Value Ref Range   Sodium 133 (L) 135 - 145 mEq/L   Potassium 4.2 3.5 - 5.1 mEq/L   Chloride 99 96 - 112 mEq/L   CO2 26 19 - 32 mEq/L   Glucose, Bld 287 (H) 70 - 99 mg/dL   BUN 7 6 - 23 mg/dL   Creatinine, Ser 0.9 0.4 - 1.5 mg/dL   Calcium 9.5 8.4 - 10.5 mg/dL   GFR 98.18 >60.00 mL/min  Hemoglobin A1c     Status: Abnormal   Collection Time: 08/17/14  9:30 AM  Result Value Ref Range   Hgb A1c MFr Bld 8.5 (H) 4.6 - 6.5 %    Comment: Glycemic Control Guidelines for People with Diabetes:Non Diabetic:  <6%Goal of Therapy: <7%Additional Action Suggested:  >8%   Glucose, random     Status: Abnormal   Collection Time: 08/17/14  9:30 AM  Result Value Ref Range   Glucose, Bld 248 (H) 70 - 99 mg/dL  Urine Drug Screen     Status: Abnormal   Collection Time: 09/18/14  9:00 PM  Result Value Ref Range   Opiates NONE DETECTED NONE DETECTED   Cocaine POSITIVE (A) NONE DETECTED   Benzodiazepines NONE DETECTED NONE DETECTED   Amphetamines NONE DETECTED NONE DETECTED   Tetrahydrocannabinol NONE DETECTED NONE DETECTED   Barbiturates NONE DETECTED NONE DETECTED    Comment:        DRUG SCREEN FOR MEDICAL PURPOSES ONLY.  IF CONFIRMATION IS NEEDED FOR ANY PURPOSE, NOTIFY LAB WITHIN 5 DAYS.          LOWEST DETECTABLE LIMITS FOR URINE DRUG SCREEN Drug Class       Cutoff (ng/mL) Amphetamine      1000 Barbiturate      200 Benzodiazepine   630 Tricyclics       160 Opiates          300 Cocaine          300 THC              50   Acetaminophen level     Status: None   Collection Time: 09/18/14  9:28 PM  Result Value Ref Range   Acetaminophen (Tylenol), Serum <15.0 10 - 30 ug/mL    Comment:        THERAPEUTIC CONCENTRATIONS VARY SIGNIFICANTLY. A RANGE OF 10-30 ug/mL MAY BE AN EFFECTIVE CONCENTRATION FOR MANY PATIENTS. HOWEVER, SOME ARE BEST TREATED AT CONCENTRATIONS OUTSIDE THIS RANGE. ACETAMINOPHEN CONCENTRATIONS >150 ug/mL AT 4 HOURS AFTER INGESTION AND >50 ug/mL AT 12 HOURS AFTER INGESTION ARE OFTEN ASSOCIATED WITH TOXIC REACTIONS.   CBC     Status: Abnormal   Collection Time: 09/18/14  9:28 PM  Result Value Ref Range   WBC 11.8 (H) 4.0 - 10.5 K/uL   RBC 4.33 4.22 - 5.81 MIL/uL   Hemoglobin 13.1 13.0 - 17.0 g/dL   HCT 37.2 (L) 39.0 - 52.0 %   MCV 85.9 78.0 - 100.0 fL   MCH 30.3 26.0 - 34.0 pg   MCHC 35.2 30.0 - 36.0 g/dL   RDW 13.5 11.5 - 15.5 %   Platelets 374 150 - 400 K/uL  Comprehensive metabolic panel     Status: Abnormal   Collection Time: 09/18/14  9:28 PM  Result Value Ref Range   Sodium 134 (L) 137 - 147 mEq/L   Potassium 4.1 3.7 - 5.3 mEq/L   Chloride 93 (L) 96 - 112 mEq/L   CO2 24 19 - 32 mEq/L   Glucose, Bld 141 (H) 70 - 99 mg/dL   BUN 3 (L) 6 - 23 mg/dL   Creatinine, Ser 0.57 0.50 - 1.35 mg/dL   Calcium 9.8 8.4 - 10.5 mg/dL   Total Protein 7.3 6.0 - 8.3 g/dL   Albumin 3.9 3.5 - 5.2 g/dL   AST 17 0 - 37 U/L   ALT 18 0 - 53 U/L   Alkaline Phosphatase 75 39 - 117 U/L   Total Bilirubin  0.3 0.3 - 1.2 mg/dL   GFR calc non Af Amer >90 >90 mL/min   GFR calc Af Amer >90 >90 mL/min    Comment: (NOTE) The eGFR has been calculated using the CKD EPI equation. This calculation has not been validated in all clinical situations. eGFR's persistently <90  mL/min signify possible Chronic Kidney Disease.    Anion gap 17 (H) 5 - 15  Ethanol (ETOH)     Status: Abnormal   Collection Time: 09/18/14  9:28 PM  Result Value Ref Range   Alcohol, Ethyl (B) 66 (H) 0 - 11 mg/dL    Comment:        LOWEST DETECTABLE LIMIT FOR SERUM ALCOHOL IS 11 mg/dL FOR MEDICAL PURPOSES ONLY   Salicylate level     Status: Abnormal   Collection Time: 09/18/14  9:28 PM  Result Value Ref Range   Salicylate Lvl <7.9 (L) 2.8 - 20.0 mg/dL  POC CBG, ED     Status: Abnormal   Collection Time: 09/18/14  9:59 PM  Result Value Ref Range   Glucose-Capillary 141 (H) 70 - 99 mg/dL  CBG monitoring, ED     Status: Abnormal   Collection Time: 09/19/14  8:18 AM  Result Value Ref Range   Glucose-Capillary 112 (H) 70 - 99 mg/dL  CBG monitoring, ED     Status: Abnormal   Collection Time: 09/19/14 12:42 PM  Result Value Ref Range   Glucose-Capillary 215 (H) 70 - 99 mg/dL   Comment 1 Notify RN   CBG monitoring, ED     Status: Abnormal   Collection Time: 09/19/14  5:28 PM  Result Value Ref Range   Glucose-Capillary 101 (H) 70 - 99 mg/dL   Comment 1 Notify RN   CBG monitoring, ED     Status: Abnormal   Collection Time: 09/19/14  8:39 PM  Result Value Ref Range   Glucose-Capillary 243 (H) 70 - 99 mg/dL  Glucose, capillary     Status: Abnormal   Collection Time: 09/19/14 11:25 PM  Result Value Ref Range   Glucose-Capillary 231 (H) 70 - 99 mg/dL  Glucose, capillary     Status: Abnormal   Collection Time: 09/20/14  6:22 AM  Result Value Ref Range   Glucose-Capillary 152 (H) 70 - 99 mg/dL  Glucose, capillary     Status: Abnormal   Collection Time: 09/20/14 11:44 AM  Result Value Ref Range   Glucose-Capillary 285 (H) 70 - 99 mg/dL   Comment 1 Notify RN   Glucose, capillary     Status: Abnormal   Collection Time: 09/20/14  4:31 PM  Result Value Ref Range   Glucose-Capillary 267 (H) 70 - 99 mg/dL   Comment 1 Notify RN   Glucose, capillary     Status: Abnormal   Collection  Time: 09/20/14  9:27 PM  Result Value Ref Range   Glucose-Capillary 216 (H) 70 - 99 mg/dL  Glucose, capillary     Status: Abnormal   Collection Time: 09/21/14  5:55 AM  Result Value Ref Range   Glucose-Capillary 257 (H) 70 - 99 mg/dL  TSH     Status: None   Collection Time: 09/21/14  6:30 AM  Result Value Ref Range   TSH 1.030 0.350 - 4.500 uIU/mL    Comment: Performed at [facility]  Glucose, capillary     Status: Abnormal   Collection Time: 09/21/14 11:58 AM  Result Value Ref Range   Glucose-Capillary 297 (H) 70 - 99 mg/dL  Comment 1 Notify RN   Glucose, capillary     Status: Abnormal   Collection Time: 09/21/14  4:26 PM  Result Value Ref Range   Glucose-Capillary 369 (H) 70 - 99 mg/dL  Glucose, capillary     Status: Abnormal   Collection Time: 09/21/14  8:38 PM  Result Value Ref Range   Glucose-Capillary 159 (H) 70 - 99 mg/dL  Glucose, capillary     Status: Abnormal   Collection Time: 09/22/14  5:49 AM  Result Value Ref Range   Glucose-Capillary 325 (H) 70 - 99 mg/dL  Glucose, capillary     Status: Abnormal   Collection Time: 09/22/14 12:08 PM  Result Value Ref Range   Glucose-Capillary 216 (H) 70 - 99 mg/dL   Comment 1 Notify RN   Glucose, capillary     Status: Abnormal   Collection Time: 09/22/14  5:14 PM  Result Value Ref Range   Glucose-Capillary 229 (H) 70 - 99 mg/dL   Comment 1 Notify RN   Glucose, capillary     Status: Abnormal   Collection Time: 09/22/14  9:10 PM  Result Value Ref Range   Glucose-Capillary 310 (H) 70 - 99 mg/dL  Glucose, capillary     Status: Abnormal   Collection Time: 09/23/14  6:12 AM  Result Value Ref Range   Glucose-Capillary 221 (H) 70 - 99 mg/dL  Glucose, capillary     Status: Abnormal   Collection Time: 09/23/14 11:45 AM  Result Value Ref Range   Glucose-Capillary 307 (H) 70 - 99 mg/dL   Comment 1 Notify RN   Glucose, capillary     Status: Abnormal   Collection Time: 09/23/14  4:51 PM  Result Value Ref Range    Glucose-Capillary 469 (H) 70 - 99 mg/dL   Comment 1 Notify RN   Glucose, capillary     Status: Abnormal   Collection Time: 09/23/14  6:18 PM  Result Value Ref Range   Glucose-Capillary 253 (H) 70 - 99 mg/dL  Glucose, capillary     Status: Abnormal   Collection Time: 09/23/14  8:33 PM  Result Value Ref Range   Glucose-Capillary 128 (H) 70 - 99 mg/dL  Glucose, capillary     Status: Abnormal   Collection Time: 09/24/14  6:03 AM  Result Value Ref Range   Glucose-Capillary 249 (H) 70 - 99 mg/dL  Glucose, capillary     Status: Abnormal   Collection Time: 09/24/14 11:52 AM  Result Value Ref Range   Glucose-Capillary 318 (H) 70 - 99 mg/dL   Comment 1 Notify RN   Glucose, capillary     Status: Abnormal   Collection Time: 09/24/14  2:33 PM  Result Value Ref Range   Glucose-Capillary 235 (H) 70 - 99 mg/dL  Glucose, capillary     Status: Abnormal   Collection Time: 09/24/14  4:52 PM  Result Value Ref Range   Glucose-Capillary 268 (H) 70 - 99 mg/dL  Glucose, capillary     Status: Abnormal   Collection Time: 09/24/14  9:21 PM  Result Value Ref Range   Glucose-Capillary 243 (H) 70 - 99 mg/dL  Glucose, capillary     Status: Abnormal   Collection Time: 09/25/14  5:51 AM  Result Value Ref Range   Glucose-Capillary 219 (H) 70 - 99 mg/dL  Glucose, capillary     Status: Abnormal   Collection Time: 09/25/14 11:14 AM  Result Value Ref Range   Glucose-Capillary 409 (H) 70 - 99 mg/dL   Comment 1 Notify  RN   Glucose, capillary     Status: Abnormal   Collection Time: 09/25/14  1:03 PM  Result Value Ref Range   Glucose-Capillary 316 (H) 70 - 99 mg/dL   Comment 1 Notify RN   Glucose, capillary     Status: Abnormal   Collection Time: 09/25/14  4:42 PM  Result Value Ref Range   Glucose-Capillary 362 (H) 70 - 99 mg/dL   Comment 1 Notify RN   Glucose, capillary     Status: Abnormal   Collection Time: 09/25/14  9:24 PM  Result Value Ref Range   Glucose-Capillary 293 (H) 70 - 99 mg/dL   Comment 1  Notify RN   Glucose, capillary     Status: Abnormal   Collection Time: 09/26/14  6:06 AM  Result Value Ref Range   Glucose-Capillary 216 (H) 70 - 99 mg/dL  Glucose, capillary     Status: Abnormal   Collection Time: 09/26/14 12:02 PM  Result Value Ref Range   Glucose-Capillary 327 (H) 70 - 99 mg/dL  Glucose, capillary     Status: Abnormal   Collection Time: 09/26/14  4:52 PM  Result Value Ref Range   Glucose-Capillary 254 (H) 70 - 99 mg/dL  Glucose, capillary     Status: Abnormal   Collection Time: 09/26/14  8:43 PM  Result Value Ref Range   Glucose-Capillary 236 (H) 70 - 99 mg/dL  Glucose, capillary     Status: Abnormal   Collection Time: 09/27/14  6:05 AM  Result Value Ref Range   Glucose-Capillary 222 (H) 70 - 99 mg/dL  Basic metabolic panel     Status: Abnormal   Collection Time: 09/27/14  6:15 AM  Result Value Ref Range   Sodium 129 (L) 137 - 147 mEq/L   Potassium 4.4 3.7 - 5.3 mEq/L   Chloride 91 (L) 96 - 112 mEq/L   CO2 25 19 - 32 mEq/L   Glucose, Bld 262 (H) 70 - 99 mg/dL   BUN 6 6 - 23 mg/dL   Creatinine, Ser 0.65 0.50 - 1.35 mg/dL   Calcium 9.5 8.4 - 10.5 mg/dL   GFR calc non Af Amer >90 >90 mL/min   GFR calc Af Amer >90 >90 mL/min    Comment: (NOTE) The eGFR has been calculated using the CKD EPI equation. This calculation has not been validated in all clinical situations. eGFR's persistently <90 mL/min signify possible Chronic Kidney Disease.    Anion gap 13 5 - 15    Comment: Performed at [facility]  Glucose, capillary     Status: Abnormal   Collection Time: 09/27/14 11:39 AM  Result Value Ref Range   Glucose-Capillary 277 (H) 70 - 99 mg/dL  Basic Metabolic Panel (BMET)     Status: Abnormal   Collection Time: 09/28/14  1:37 PM  Result Value Ref Range   Sodium 133 (L) 135 - 145 mEq/L   Potassium 5.0 3.5 - 5.1 mEq/L   Chloride 97 96 - 112 mEq/L   CO2 27 19 - 32 mEq/L   Glucose, Bld 207 (H) 70 - 99 mg/dL   BUN 8 6 - 23 mg/dL    Creatinine, Ser 0.8 0.4 - 1.5 mg/dL   Calcium 9.9 8.4 - 10.5 mg/dL   GFR 113.85 >60.00 mL/min  Magnesium     Status: None   Collection Time: 09/28/14  1:37 PM  Result Value Ref Range   Magnesium 1.9 1.5 - 2.5 mg/dL    Assessment/Plan: Muscle spasms of  neck Rx Zanaflex for muscle spasm relief.  Rx 800 mg Ibuprofen to take as directed with food.  Topical Aspercreme.  Heating pad sparingly.  Call or return to clinic if symptoms are not improving.

## 2014-10-17 NOTE — Assessment & Plan Note (Signed)
Rx Zanaflex for muscle spasm relief.  Rx 800 mg Ibuprofen to take as directed with food.  Topical Aspercreme.  Heating pad sparingly.  Call or return to clinic if symptoms are not improving.

## 2014-10-17 NOTE — Patient Instructions (Signed)
Please take Zanaflex and Ibuprofen as directed to relieve neck pain and spasm.  Do not take the Zanaflex and drive or operate heavy machinery.  Apply topical Aspercreme to the neck and shoulders.  A heating pad may also help, but do not use for over 15 minutes at a time.  Call or return to clinic if symptoms are not improving.

## 2014-10-19 ENCOUNTER — Ambulatory Visit (HOSPITAL_COMMUNITY): Payer: Self-pay | Admitting: Psychiatry

## 2014-10-24 ENCOUNTER — Ambulatory Visit: Payer: PRIVATE HEALTH INSURANCE | Admitting: Licensed Clinical Social Worker

## 2014-10-25 ENCOUNTER — Telehealth: Payer: Self-pay | Admitting: *Deleted

## 2014-10-25 MED ORDER — SIMVASTATIN 40 MG PO TABS: 40.0000 mg | ORAL_TABLET | Freq: Every day | ORAL | Status: DC

## 2014-10-25 NOTE — Telephone Encounter (Signed)
Received refill request from pharmacy for simvastatin 40mg  daily. Refill sent.

## 2014-10-31 ENCOUNTER — Ambulatory Visit: Payer: PRIVATE HEALTH INSURANCE | Admitting: Licensed Clinical Social Worker

## 2014-11-09 ENCOUNTER — Ambulatory Visit (HOSPITAL_COMMUNITY): Payer: Self-pay | Admitting: Psychiatry

## 2014-11-14 ENCOUNTER — Other Ambulatory Visit (HOSPITAL_COMMUNITY): Payer: Self-pay | Admitting: *Deleted

## 2014-11-14 DIAGNOSIS — F3131 Bipolar disorder, current episode depressed, mild: Secondary | ICD-10-CM

## 2014-11-14 MED ORDER — TRAZODONE HCL 100 MG PO TABS: 100.0000 mg | ORAL_TABLET | Freq: Every day | ORAL | Status: DC

## 2014-11-14 MED ORDER — LAMOTRIGINE 200 MG PO TABS: 200.0000 mg | ORAL_TABLET | Freq: Every day | ORAL | Status: DC

## 2014-11-14 MED ORDER — DULOXETINE HCL 60 MG PO CPEP: 60.0000 mg | ORAL_CAPSULE | Freq: Two times a day (BID) | ORAL | Status: DC

## 2014-11-14 NOTE — Telephone Encounter (Signed)
Pt called for Prescription refill of Trazadone, Lamictal and Cymbalta. Pt informed that 30 day supply will be called into [facility].

## 2014-11-15 DIAGNOSIS — M5412 Radiculopathy, cervical region: Secondary | ICD-10-CM | POA: Diagnosis not present

## 2014-11-15 DIAGNOSIS — M47812 Spondylosis without myelopathy or radiculopathy, cervical region: Secondary | ICD-10-CM | POA: Diagnosis not present

## 2014-11-15 DIAGNOSIS — Z79891 Long term (current) use of opiate analgesic: Secondary | ICD-10-CM | POA: Diagnosis not present

## 2014-11-15 DIAGNOSIS — F329 Major depressive disorder, single episode, unspecified: Secondary | ICD-10-CM | POA: Diagnosis not present

## 2014-11-15 DIAGNOSIS — G894 Chronic pain syndrome: Secondary | ICD-10-CM | POA: Diagnosis not present

## 2014-11-20 ENCOUNTER — Ambulatory Visit (HOSPITAL_COMMUNITY): Payer: Self-pay | Admitting: Psychiatry

## 2014-12-04 ENCOUNTER — Encounter: Payer: Self-pay | Admitting: Family

## 2014-12-04 ENCOUNTER — Ambulatory Visit (INDEPENDENT_AMBULATORY_CARE_PROVIDER_SITE_OTHER): Payer: Medicare Other | Admitting: Family

## 2014-12-04 VITALS — BP 110/70 | HR 89 | Temp 97.8°F | Resp 16 | Ht 72.0 in | Wt 176.4 lb

## 2014-12-04 DIAGNOSIS — IMO0002 Reserved for concepts with insufficient information to code with codable children: Secondary | ICD-10-CM

## 2014-12-04 DIAGNOSIS — E871 Hypo-osmolality and hyponatremia: Secondary | ICD-10-CM | POA: Diagnosis not present

## 2014-12-04 DIAGNOSIS — M542 Cervicalgia: Secondary | ICD-10-CM

## 2014-12-04 DIAGNOSIS — E119 Type 2 diabetes mellitus without complications: Secondary | ICD-10-CM | POA: Diagnosis not present

## 2014-12-04 DIAGNOSIS — M549 Dorsalgia, unspecified: Secondary | ICD-10-CM

## 2014-12-04 DIAGNOSIS — R634 Abnormal weight loss: Secondary | ICD-10-CM

## 2014-12-04 DIAGNOSIS — E1165 Type 2 diabetes mellitus with hyperglycemia: Secondary | ICD-10-CM

## 2014-12-04 MED ORDER — METHYLPREDNISOLONE 4 MG PO KIT: PACK | ORAL | Status: DC

## 2014-12-04 NOTE — Progress Notes (Signed)
Subjective:    Patient ID: [name], male    DOB: [birth year], 39 y.o.   MRN: [id number]  HPI 1. [name] is here today constant left neck and shoulder pain for the last 2 weeks. He has had occurences of this neck pain for 10-12 years. He would like a referral to Dr. [name]  with [facility] who did his father's surgery. He was seen by  Dr. Shon Baton early in 2015 at [facility] and he said that there was a 50/50 chance that surgery would improve his neck pain. He was seen here 10/17/2014 for muscle spasms in the neck and prescribed Zanaflex and ibuprofen which have worked fairly well. He goes to the pain clinic and they prescribe Morphine ER and Oxycodone. He has been taking these daily for several months and would like to stop. He is worried about withdrawal.They relieve his pain to some extent but he would like to take care of the problem in his neck rather than continue to take pain meds.  3. DM2 - fasting sugars run in low 100s. He has not seen Dr. Lucianne Muss lately.  4. Unintentional weight loss. It is noted on this visit that he has lost 46 pounds unintentionally since last year. 24 diet recall reveals what seems to be ample calories to maintain weight.   5. Depression - He reports that things are going well and his mood is stable. He is still being followed by psychiatry and going to counseling.   Review of Systems Reports numbness, tingling, and weakness in left arm. The pain also shoots down his arm.    Past Medical History  Diagnosis Date  . Allergy   . Depression   . GERD (gastroesophageal reflux disease)   . Hyperlipidemia   . Chronic pain disorder     Sees [facility]  . Urinary incontinence     detrusor instability  . Hypogonadism   . Diabetes mellitus     Type II  . Chronic neck pain   . Bipolar depression     History   Social History  . Marital Status: Married    Spouse Name: N/A    Number of Children: 2  . Years of  Education: N/A   Occupational History  . Disabled     chronic pain   Social History Main Topics  . Smoking status: Current Every Day Smoker -- 2.00 packs/day    Types: Cigarettes    Last Attempt to Quit: 08/29/2013  . Smokeless tobacco: Never Used  . Alcohol Use: 0.0 oz/week    0 Not specified per week     Comment: Drinking daily x1wk   . Drug Use: Yes    Special: Cocaine  . Sexual Activity: Yes    Birth Control/ Protection: None   Other Topics Concern  . Not on file   Social History Narrative    Past Surgical History  Procedure Laterality Date  . Appendectomy    . Hernia repair    . Nasal sinus surgery  2008    Family History  Problem Relation Age of Onset  . Diabetes Mother   . Hypertension Mother   . Cirrhosis Mother   . Depression Mother   . Bipolar disorder Mother   . Arthritis Father 49    osteoarthritis  . Bipolar disorder Father   . Diabetes Sister     borderline  . Heart disease Paternal Uncle 66    MI  . Heart disease Maternal Grandfather  late 60's--MI  . Cancer Paternal Grandmother [id number]    lung  . Bipolar disorder Paternal Grandmother   . Heart disease Paternal Grandfather [id number]    MI  . Bipolar disorder Paternal Grandfather     Allergies  Allergen Reactions  . Ciprofloxacin Hives  . Hydrocodone     Rash and nausea    Current Outpatient Prescriptions on File Prior to Visit  Medication Sig Dispense Refill  . albuterol (PROVENTIL HFA;VENTOLIN HFA) 108 (90 BASE) MCG/ACT inhaler Inhale 2 puffs into the lungs every 6 (six) hours as needed for wheezing or shortness of breath. 1 Inhaler 0  . augmented betamethasone dipropionate (DIPROLENE-AF) 0.05 % cream Apply 1 application topically 2 (two) times daily as needed. rash 30 g 0  . azelastine (ASTELIN) 0.1 % nasal spray Place 2 sprays into both nostrils 2 (two) times daily. Use in each nostril as directed 30 mL 12  . cetirizine (ZYRTEC) 10 MG tablet Take 1 tablet (10 mg total) by mouth daily. For  allergies    . DULoxetine (CYMBALTA) 60 MG capsule Take 1 capsule (60 mg total) by mouth 2 (two) times daily. 60 capsule 0  . hydrOXYzine (ATARAX/VISTARIL) 50 MG tablet Take 1 tablet (50 mg total) by mouth daily as needed for anxiety. 30 tablet 0  . ibuprofen (ADVIL,MOTRIN) 800 MG tablet Take 1 tablet (800 mg total) by mouth every 8 (eight) hours as needed. 30 tablet 0  . Insulin Glargine (LANTUS SOLOSTAR) 100 UNIT/ML Solostar Pen Inject 30 Units into the skin at bedtime. 15 mL 11  . lamoTRIgine (LAMICTAL) 200 MG tablet Take 1 tablet (200 mg total) by mouth daily. Take in the morning for mood control. 30 tablet 0  . metFORMIN (GLUCOPHAGE) 1000 MG tablet Take 1 tablet (1,000 mg total) by mouth 2 (two) times daily with a meal.    . Multiple Vitamin (MULTIVITAMIN WITH MINERALS) TABS tablet Take 1 tablet by mouth daily.    Marland Kitchen. omega-3 acid ethyl esters (LOVAZA) 1 G capsule Take 4 capsules (4 g total) by mouth daily. For high cholesterol/fats    . pioglitazone (ACTOS) 30 MG tablet Take 1 tablet (30 mg total) by mouth daily. 30 tablet 3  . simvastatin (ZOCOR) 40 MG tablet Take 1 tablet (40 mg total) by mouth at bedtime. For high cholesterol 30 tablet 2  . Testosterone 75 MG PLLT Inject 150 mg into the skin.    Marland Kitchen. tiZANidine (ZANAFLEX) 4 MG tablet Take 1 tablet (4 mg total) by mouth every 8 (eight) hours as needed for muscle spasms. 30 tablet 0  . traZODone (DESYREL) 100 MG tablet Take 1 tablet (100 mg total) by mouth at bedtime. 30 tablet 0  . [DISCONTINUED] albuterol (PROVENTIL) 90 MCG/ACT inhaler Inhale 2 puffs into the lungs every 6 (six) hours as needed for wheezing. 17 g 12   No current facility-administered medications on file prior to visit.    BP 110/70 mmHg  Pulse 89  Temp(Src) 97.8 F (36.6 C) (Oral)  Resp 16  Ht 6' (1.829 m)  Wt 176 lb 6.4 oz (80.015 kg)  BMI 23.92 kg/m2  SpO2 99%   Objective:   Physical Exam  Constitutional: He is oriented to person, place, and time. He appears  well-developed. No distress.  Thin.  HENT:  Head: Normocephalic and atraumatic.  Neck: Neck supple.  Cardiovascular: Normal rate, regular rhythm and normal heart sounds.  Exam reveals no gallop and no friction rub.   No murmur heard. Pulmonary/Chest: Effort normal and  breath sounds normal. No respiratory distress. He has no wheezes. He has no rales.  Musculoskeletal:  Neck and shoulder are tender to palpation. ROM decreased in left shoulder.  Neurological: He is alert and oriented to person, place, and time.  Reflex Scores:      Bicep reflexes are 2+ on the right side and 2+ on the left side. Sensation in left lower arm somewhat decreased.  Skin: Skin is warm and dry. He is not diaphoretic.  Psychiatric: He has a normal mood and affect. His behavior is normal. Judgment and thought content normal.          Assessment & Plan:  Patient seen along with [facility] NP-student.  I have personally seen and examined patient and agree with Ms. Whitmire's assessment and plan- obtain CT chest/abd/pelvis to further evaluate for possible underling malignancy given unintentional weight loss. Refer to Dr.Dumonski at pt's request for ongoing management of his neck pain. Sandford Craze NP

## 2014-12-04 NOTE — Assessment & Plan Note (Addendum)
Fasting BS running low 100s. Instructed him to monitor sugars closely while taking Medrol dose pack. Instructed him to follow up with Dr. Lucianne MussKumar. Will obtain A1C today.

## 2014-12-04 NOTE — Assessment & Plan Note (Signed)
Will refer to Dr. Yevette Edwardsumonski with [facility] for evaluation and treatment of left neck and shoulder pain. Rx for Medrol dose pack.

## 2014-12-04 NOTE — Patient Instructions (Signed)
Please complete lab work prior to leaving. You will be contacted about your CT scan.

## 2014-12-04 NOTE — Assessment & Plan Note (Addendum)
Instructed patient to wait until he is evaluated by Dr. Yevette Edwardsumonski before trying to discontinue pain meds. When appropriate, pain management would handle helping  him wean off.

## 2014-12-04 NOTE — Assessment & Plan Note (Addendum)
Has continued to lose weight unintentionally with a loss of 46 lbs since last year. Concerned about possible malignancy in light of his weight loss and hyponatremia. Will obtain CT of abdomen/pelvic/chest. Will also obtain BMET.

## 2014-12-05 ENCOUNTER — Other Ambulatory Visit (INDEPENDENT_AMBULATORY_CARE_PROVIDER_SITE_OTHER): Payer: 59

## 2014-12-05 ENCOUNTER — Telehealth: Payer: Self-pay | Admitting: Family

## 2014-12-05 DIAGNOSIS — E871 Hypo-osmolality and hyponatremia: Secondary | ICD-10-CM | POA: Diagnosis not present

## 2014-12-05 NOTE — Telephone Encounter (Signed)
Caller name:Josedejesus Lathon Relationship to patient:self Can be reached:[phone]  Pharmacy:  Reason for call:Requesting a muscle relaxer, please advise

## 2014-12-05 NOTE — Telephone Encounter (Signed)
I would recommend that he discuss this with his pain management or with orthopedics since they are managing neck issues/pain.

## 2014-12-05 NOTE — Telephone Encounter (Signed)
Notified pt and he voices understanding. 

## 2014-12-06 ENCOUNTER — Telehealth: Payer: Self-pay | Admitting: Family

## 2014-12-06 LAB — BASIC METABOLIC PANEL
BUN: 14 mg/dL (ref 6–23)
CO2: 25 meq/L (ref 19–32)
CREATININE: 0.88 mg/dL (ref 0.40–1.50)
Calcium: 9.5 mg/dL (ref 8.4–10.5)
Chloride: 96 mEq/L (ref 96–112)
GFR: 101.9 mL/min (ref >60.00)
GLUCOSE: 482 mg/dL — AB (ref 70–99)
Potassium: 4.2 mEq/L (ref 3.5–5.1)
Sodium: 130 mEq/L — ABNORMAL LOW (ref 135–145)

## 2014-12-06 NOTE — Telephone Encounter (Signed)
Please contact pt and advise him sugar is high. Raise lantus from 30 to 40.  He needs to schedule follow up with Dr. Lucianne MussKumar also please.

## 2014-12-06 NOTE — Telephone Encounter (Signed)
Patient informed, understood & agreed/SLS  

## 2014-12-07 ENCOUNTER — Encounter (HOSPITAL_BASED_OUTPATIENT_CLINIC_OR_DEPARTMENT_OTHER): Payer: Self-pay

## 2014-12-07 ENCOUNTER — Telehealth: Payer: Self-pay | Admitting: *Deleted

## 2014-12-07 ENCOUNTER — Ambulatory Visit (HOSPITAL_BASED_OUTPATIENT_CLINIC_OR_DEPARTMENT_OTHER)
Admission: RE | Admit: 2014-12-07 | Discharge: 2014-12-07 | Disposition: A | Discharge status: Home / Self Care | Payer: Medicare Other | Source: Ambulatory Visit | Attending: Family | Admitting: Family

## 2014-12-07 DIAGNOSIS — M25512 Pain in left shoulder: Secondary | ICD-10-CM | POA: Diagnosis not present

## 2014-12-07 DIAGNOSIS — K8689 Other specified diseases of pancreas: Secondary | ICD-10-CM

## 2014-12-07 DIAGNOSIS — R634 Abnormal weight loss: Secondary | ICD-10-CM | POA: Insufficient documentation

## 2014-12-07 DIAGNOSIS — R918 Other nonspecific abnormal finding of lung field: Secondary | ICD-10-CM | POA: Insufficient documentation

## 2014-12-07 DIAGNOSIS — R1909 Other intra-abdominal and pelvic swelling, mass and lump: Secondary | ICD-10-CM | POA: Diagnosis not present

## 2014-12-07 DIAGNOSIS — S139XXA Sprain of joints and ligaments of unspecified parts of neck, initial encounter: Secondary | ICD-10-CM | POA: Diagnosis not present

## 2014-12-07 DIAGNOSIS — K868 Other specified diseases of pancreas: Secondary | ICD-10-CM | POA: Diagnosis not present

## 2014-12-07 DIAGNOSIS — R911 Solitary pulmonary nodule: Secondary | ICD-10-CM | POA: Insufficient documentation

## 2014-12-07 DIAGNOSIS — R110 Nausea: Secondary | ICD-10-CM | POA: Insufficient documentation

## 2014-12-07 MED ORDER — IOHEXOL 300 MG/ML  SOLN
100.0000 mL | Freq: Once | INTRAMUSCULAR | Status: AC | PRN
  Administered 2014-12-07: 100 mL via INTRAVENOUS

## 2014-12-07 NOTE — Telephone Encounter (Signed)
[facility] CT results revealed a 4 mm nodular opacity in the posterior segment of the left upper lobe.    Recommendations for follow up are: If the patient is at high risk forbronchogenic carcinoma, follow-up chest CT at 1 year is recommended. If the patient is at low risk, no follow-up is needed.   Results in Epic.

## 2014-12-07 NOTE — Telephone Encounter (Signed)
Please contact pt and let him know that Ct shows area on pancreas that needs further evaluation- Radiologist has recommended MRI which I have pended below.  CT chest notes small nodule. We should repeat CT chest in 6 months.

## 2014-12-08 MED ORDER — DIAZEPAM 5 MG PO TABS: ORAL_TABLET | ORAL | Status: DC

## 2014-12-08 NOTE — Telephone Encounter (Signed)
Yes, please call in valium rx pending below.  Pt should have someone drive him to and from the procedure if he takes valium.

## 2014-12-08 NOTE — Telephone Encounter (Signed)
MRI is scheduled for 12/15/14 @12 :15, pt aware

## 2014-12-08 NOTE — Telephone Encounter (Signed)
Marj or Jen--  Please see below note and advise re: MRI?  Notified pt and he voices understanding and is agreeable to proceed with additional MRI. Pt states he [facility] at 1:45pm and wants to know if MRI pancreas could be done at that time?  Advised pt that I think he will need separate MRI for pancreas but we will let him know if it can be done at the same time.

## 2014-12-08 NOTE — Addendum Note (Signed)
Addended by: Sandford Craze'SULLIVAN, Zahmir Lalla on: 12/08/2014 03:05 PM   Modules accepted: Orders

## 2014-12-08 NOTE — Telephone Encounter (Signed)
Can valium be ordered for the patient? If he does both studies together, he will be in machine for over 2 hrs. Please advise.

## 2014-12-08 NOTE — Telephone Encounter (Signed)
Rx called to pharmacist. Pt notified and is requesting time of MRI?

## 2014-12-08 NOTE — Addendum Note (Signed)
Addended by: Mervin KungFERGERSON, Tyrique Sporn A on: 12/08/2014 03:22 PM   Modules accepted: Orders

## 2014-12-12 ENCOUNTER — Ambulatory Visit (INDEPENDENT_AMBULATORY_CARE_PROVIDER_SITE_OTHER): Payer: 59 | Admitting: Family Medicine

## 2014-12-12 ENCOUNTER — Other Ambulatory Visit (HOSPITAL_COMMUNITY): Payer: Self-pay | Admitting: Psychiatry

## 2014-12-12 ENCOUNTER — Encounter: Payer: Self-pay | Admitting: Family Medicine

## 2014-12-12 VITALS — BP 118/68 | HR 97 | Temp 97.4°F | Resp 18 | Wt 181.5 lb

## 2014-12-12 DIAGNOSIS — J208 Acute bronchitis due to other specified organisms: Secondary | ICD-10-CM

## 2014-12-12 DIAGNOSIS — J0110 Acute frontal sinusitis, unspecified: Secondary | ICD-10-CM

## 2014-12-12 MED ORDER — PROMETHAZINE-DM 6.25-15 MG/5ML PO SYRP: 5.0000 mL | ORAL_SOLUTION | Freq: Four times a day (QID) | ORAL | Status: DC | PRN

## 2014-12-12 MED ORDER — METHYLPREDNISOLONE ACETATE 80 MG/ML IJ SUSP
80.0000 mg | Freq: Once | INTRAMUSCULAR | Status: AC
  Administered 2014-12-12: 80 mg via INTRAMUSCULAR

## 2014-12-12 MED ORDER — AMOXICILLIN-POT CLAVULANATE 875-125 MG PO TABS: 1.0000 | ORAL_TABLET | Freq: Two times a day (BID) | ORAL | Status: DC

## 2014-12-12 NOTE — Progress Notes (Signed)
Pre visit review using our clinic review tool, if applicable. No additional management support is needed unless otherwise documented below in the visit note. 

## 2014-12-12 NOTE — Patient Instructions (Signed)

## 2014-12-12 NOTE — Progress Notes (Signed)
Subjective:     [name] is a 39 y.o. male who presents for evaluation of symptoms of a URI. Symptoms include bilateral ear pressure/pain, congestion, nasal congestion, no  fever, productive cough with  green colored sputum, purulent nasal discharge, sinus pressure, sore throat and wheezing. Onset of symptoms was 5 days ago, and has been gradually worsening since that time. Treatment to date: antihistamines, cough suppressants and expectorants.  The following portions of the patient's history were reviewed and updated as appropriate:  He  has a past medical history of Allergy; Depression; GERD (gastroesophageal reflux disease); Hyperlipidemia; Chronic pain disorder; Urinary incontinence; Hypogonadism; Diabetes mellitus; Chronic neck pain; and Bipolar depression. He  does not have any pertinent problems on file. He  has past surgical history that includes Appendectomy; Hernia repair; and Nasal sinus surgery (2008). His family history includes Arthritis (age of onset: 70) in his father; Bipolar disorder in his father, mother, paternal grandfather, and paternal grandmother; Cancer (age of onset: 52) in his paternal grandmother; Cirrhosis in his mother; Depression in his mother; Diabetes in his mother and sister; Heart disease in his maternal grandfather; Heart disease (age of onset: 40) in his paternal uncle; Heart disease (age of onset: 23) in his paternal grandfather; Hypertension in his mother. He  reports that he has been smoking Cigarettes.  He has been smoking about 2.00 packs per day. He has never used smokeless tobacco. He reports that he drinks alcohol. He reports that he uses illicit drugs (Cocaine). He has a current medication list which includes the following prescription(s): albuterol, augmented betamethasone dipropionate, azelastine, cetirizine, diazepam, duloxetine, endocet, hydroxyzine, insulin glargine, lamotrigine, metformin, multivitamin with minerals, myrbetriq, pioglitazone,  testosterone, trazodone, ibuprofen, methylprednisolone, omega-3 acid ethyl esters, simvastatin, and tizanidine. Current Outpatient Prescriptions on File Prior to Visit  Medication Sig Dispense Refill  . albuterol (PROVENTIL HFA;VENTOLIN HFA) 108 (90 BASE) MCG/ACT inhaler Inhale 2 puffs into the lungs every 6 (six) hours as needed for wheezing or shortness of breath. 1 Inhaler 0  . augmented betamethasone dipropionate (DIPROLENE-AF) 0.05 % cream Apply 1 application topically 2 (two) times daily as needed. rash 30 g 0  . azelastine (ASTELIN) 0.1 % nasal spray Place 2 sprays into both nostrils 2 (two) times daily. Use in each nostril as directed 30 mL 12  . cetirizine (ZYRTEC) 10 MG tablet Take 1 tablet (10 mg total) by mouth daily. For allergies    . diazepam (VALIUM) 5 MG tablet Take 1 tablet 30 minutes prior to MRI. 1 tablet 0  . DULoxetine (CYMBALTA) 60 MG capsule Take 1 capsule (60 mg total) by mouth 2 (two) times daily. 60 capsule 0  . ENDOCET 10-325 MG per tablet Take 1 tablet by mouth every 6 (six) hours as needed. for pain  0  . hydrOXYzine (ATARAX/VISTARIL) 50 MG tablet Take 1 tablet (50 mg total) by mouth daily as needed for anxiety. 30 tablet 0  . Insulin Glargine (LANTUS SOLOSTAR) 100 UNIT/ML Solostar Pen Inject 30 Units into the skin at bedtime. 15 mL 11  . lamoTRIgine (LAMICTAL) 200 MG tablet Take 1 tablet (200 mg total) by mouth daily. Take in the morning for mood control. 30 tablet 0  . metFORMIN (GLUCOPHAGE) 1000 MG tablet Take 1 tablet (1,000 mg total) by mouth 2 (two) times daily with a meal.    . Multiple Vitamin (MULTIVITAMIN WITH MINERALS) TABS tablet Take 1 tablet by mouth daily.    Marland Kitchen MYRBETRIQ 25 MG TB24 tablet Take 25 mg by mouth daily.  11  . pioglitazone (ACTOS) 30 MG tablet Take 1 tablet (30 mg total) by mouth daily. 30 tablet 3  . Testosterone 75 MG PLLT Inject 150 mg into the skin.    Marland Kitchen. traZODone (DESYREL) 100 MG tablet Take 1 tablet (100 mg total) by mouth at bedtime.  30 tablet 0  . ibuprofen (ADVIL,MOTRIN) 800 MG tablet Take 1 tablet (800 mg total) by mouth every 8 (eight) hours as needed. (Patient not taking: Reported on 12/12/2014) 30 tablet 0  . methylPREDNISolone (MEDROL DOSEPAK) 4 MG tablet follow package directions (Patient not taking: Reported on 12/12/2014) 21 tablet 0  . omega-3 acid ethyl esters (LOVAZA) 1 G capsule Take 4 capsules (4 g total) by mouth daily. For high cholesterol/fats (Patient not taking: Reported on 12/12/2014)    . simvastatin (ZOCOR) 40 MG tablet Take 1 tablet (40 mg total) by mouth at bedtime. For high cholesterol (Patient not taking: Reported on 12/12/2014) 30 tablet 2  . tiZANidine (ZANAFLEX) 4 MG tablet Take 1 tablet (4 mg total) by mouth every 8 (eight) hours as needed for muscle spasms. (Patient not taking: Reported on 12/12/2014) 30 tablet 0  . [DISCONTINUED] albuterol (PROVENTIL) 90 MCG/ACT inhaler Inhale 2 puffs into the lungs every 6 (six) hours as needed for wheezing. 17 g 12   No current facility-administered medications on file prior to visit.   He is allergic to ciprofloxacin and hydrocodone..  Review of Systems Pertinent items are noted in HPI.   Objective:    BP 118/68 mmHg  Pulse 97  Temp(Src) 97.4 F (36.3 C) (Oral)  Resp 18  Wt 181 lb 8 oz (82.328 kg)  SpO2 99% General appearance: alert, cooperative, appears stated age and no distress Ears: normal TM's and external ear canals both ears Nose: green discharge, moderate congestion, turbinates red, swollen, sinus tenderness bilateral Throat: lips, mucosa, and tongue normal; teeth and gums normal Neck: no adenopathy, supple, symmetrical, trachea midline and thyroid not enlarged, symmetric, no tenderness/mass/nodules Lungs: clear to auscultation bilaterally Heart: regular rate and rhythm, S1, S2 normal, no murmur, click, rub or gallop Extremities: extremities normal, atraumatic, no cyanosis or edema   Assessment:    sinusitis   Plan:    Discussed diagnosis  and treatment of URI. Suggested symptomatic OTC remedies. Augmentin per orders. Nasal steroids per orders. Follow up as needed.

## 2014-12-13 DIAGNOSIS — F329 Major depressive disorder, single episode, unspecified: Secondary | ICD-10-CM | POA: Diagnosis not present

## 2014-12-13 DIAGNOSIS — M5412 Radiculopathy, cervical region: Secondary | ICD-10-CM | POA: Diagnosis not present

## 2014-12-13 DIAGNOSIS — G894 Chronic pain syndrome: Secondary | ICD-10-CM | POA: Diagnosis not present

## 2014-12-13 DIAGNOSIS — M47812 Spondylosis without myelopathy or radiculopathy, cervical region: Secondary | ICD-10-CM | POA: Diagnosis not present

## 2014-12-13 NOTE — Telephone Encounter (Signed)
Patient canceled appointments on 10/19/14 and 11/20/14.  Received a one time order of Lamictal, Cymbalta and Trazadone on 11/14/14.  Has next evaluation scheduled 12/19/14.  Called patient who reports he has 1 day left of Cymbalta, 3 days of Lamictal and 4-5 days of Trazadone.  Would like a one time refill of each and states he plans on keeping appointment 12/19/14.

## 2014-12-13 NOTE — Telephone Encounter (Signed)
Medication refills not approved as patient has missed last 2 appointments.  Patient is scheduled to see provider 12/19/14.

## 2014-12-13 NOTE — Telephone Encounter (Signed)
Needs to be seen

## 2014-12-14 ENCOUNTER — Telehealth (HOSPITAL_COMMUNITY): Payer: Self-pay

## 2014-12-14 ENCOUNTER — Other Ambulatory Visit (HOSPITAL_COMMUNITY): Payer: Self-pay | Admitting: Psychiatry

## 2014-12-14 DIAGNOSIS — F3131 Bipolar disorder, current episode depressed, mild: Secondary | ICD-10-CM

## 2014-12-14 MED ORDER — DULOXETINE HCL 60 MG PO CPEP: 60.0000 mg | ORAL_CAPSULE | Freq: Two times a day (BID) | ORAL | Status: DC

## 2014-12-14 MED ORDER — LAMOTRIGINE 200 MG PO TABS: 200.0000 mg | ORAL_TABLET | Freq: Every day | ORAL | Status: DC

## 2014-12-14 MED ORDER — TRAZODONE HCL 100 MG PO TABS
100.0000 mg | ORAL_TABLET | Freq: Every day | ORAL | Status: DC
Start: 2014-12-14 — End: 2014-12-19

## 2014-12-14 NOTE — Telephone Encounter (Signed)
Please provide 30 day medication supply of Lamictal, trazodone and Cymbalta.  We will discuss on February 9 on his multiple no shows.

## 2014-12-14 NOTE — Telephone Encounter (Signed)
Patient called back requesting enough Cymbalta, Lamictal and Trazadone to last him until the next appointment 12/19/14.  Patient stated he understood the refill of each was denied due to need for patient to be seen and stated he tried to get an earlier appointment so he would not run out of medications.  Patient acknowledged he had missed several appointments in the past but plans to keep upcoming appointment.  Patient states he is completely out of Cymbalta, and has 2 pills left of Lamictal and Trazadone.  Patient is scheduled for evaluation 12/19/14 at 8:30am.

## 2014-12-14 NOTE — Telephone Encounter (Signed)
Dr. Lolly MustacheArfeen authorized a one time refill of patient's Lamictal, Cymbalta and Trazadone.  Patient's orders e-scribed in to [facility] in [place].  Attempted to reach patient to inform medications were approved but no answer.  Will inform patient if he calls back and will request pharmacy attempt to contact him about authorized refills. Pharmacy agreed to continue attempts to reach patient.

## 2014-12-15 DIAGNOSIS — M47812 Spondylosis without myelopathy or radiculopathy, cervical region: Secondary | ICD-10-CM | POA: Diagnosis not present

## 2014-12-15 DIAGNOSIS — R1084 Generalized abdominal pain: Secondary | ICD-10-CM | POA: Diagnosis not present

## 2014-12-18 ENCOUNTER — Telehealth: Payer: Self-pay | Admitting: Family

## 2014-12-18 NOTE — Telephone Encounter (Signed)
Results are in your folder for review.

## 2014-12-18 NOTE — Telephone Encounter (Signed)
[facility] could you please contact them and ask for a result?  thanks

## 2014-12-18 NOTE — Telephone Encounter (Signed)
Caller name: Tinnie GensJeffrey Relation to pt: self Call back number: [phone] Pharmacy:  Reason for call:   Patient scheduled appointment for 12/20/14 to discuss MRI results. He said not to call him with results, that he will see Melissa on wednesday

## 2014-12-19 ENCOUNTER — Ambulatory Visit: Payer: 59 | Admitting: Licensed Clinical Social Worker

## 2014-12-19 ENCOUNTER — Encounter (HOSPITAL_COMMUNITY): Payer: Self-pay | Admitting: Psychiatry

## 2014-12-19 ENCOUNTER — Ambulatory Visit (INDEPENDENT_AMBULATORY_CARE_PROVIDER_SITE_OTHER): Payer: 59 | Admitting: Psychiatry

## 2014-12-19 VITALS — BP 135/88 | HR 107 | Ht 73.0 in | Wt 180.4 lb

## 2014-12-19 DIAGNOSIS — F3131 Bipolar disorder, current episode depressed, mild: Secondary | ICD-10-CM

## 2014-12-19 MED ORDER — DULOXETINE HCL 60 MG PO CPEP: 60.0000 mg | ORAL_CAPSULE | Freq: Two times a day (BID) | ORAL | Status: DC

## 2014-12-19 MED ORDER — LAMOTRIGINE 200 MG PO TABS
200.0000 mg | ORAL_TABLET | Freq: Every day | ORAL | Status: DC
Start: 2014-12-19 — End: 2015-04-19

## 2014-12-19 MED ORDER — TRAZODONE HCL 100 MG PO TABS: 100.0000 mg | ORAL_TABLET | Freq: Every day | ORAL | Status: DC

## 2014-12-19 NOTE — Progress Notes (Signed)
[place] [phone] Progress Note  [name] [id number] y.o.  12/19/2014 9:02 AM  Chief Complaint:  I am keep losing weight.  I'm taking medication and my depression is under control.         History of Present Illness:  [name] came for his followup appointment .  He missed his last appointment .  He is no longer taking Zanaflex and he was given 1 dose of Valium for MRI.  He is taking Cymbalta and trazodone and Lamictal.  He had call us that he does not want to take Geodon.  Patient reported overall his mood has been stable and he is feeling better since he stopped his alcohol 3 months ago.  He is happy and proud that next week he will be celebrating his 83 day sobriety.  However he is concerned about his physical health.  He is losing weight and recently he told that he has abnormal test showing growth in his pancreatitis.  He has MRI but he is not overweight about the results.  He is going to see his primary care physician very soon to discuss the results.  Patient denies any agitation, anger, mood swing.  He started seeing a girl and he reported relationship is going well.  2 weeks ago he was very sad and tearful because his grandmother died .  However he denies any feeling of hopelessness or worthlessness.  He is seeing [name] for counseling.  He is not using any illegal substances.  He sleeping good and denies any anger or rage.  He has no rash or itching.  His son is living with him and sometime he is able to see his daughter who is living with his ex-wife who cheated on him a few months ago.  Patient denies any paranoia or any hallucination.  He reported his appetite is okay however his weight has been gradually decreased.  Suicidal Ideation: No Plan Formed: No Patient has means to carry out plan: No  Homicidal Ideation: No Plan Formed: No Patient has means to carry out plan: No  Past Psychiatric History/Hospitalization(s) Patient has multiple psychiatric hospital  admission in past. His last psychiatric hospitalization was November 2015 at [facility].  He was depressed and having suicidal thoughts.  He was drinking alcohol and using cocaine.  Earlier than then he was admitted from Mar 20, 2014 to Mar 24, 2014.  He was not taking his Lamictal and relapsed into drinking.  His blood alcohol level was 149 and his UDS is positive for cocaine.  In the past he has hospitalization at [facility], [place].  He has history of suicidal thinking and overdose on Klonopin.  He endorsed history of paranoia, anger , substance use , mood swings anger and irritability.  He had tried Depakote, BuSpar, Risperdal, Prozac in recent months but stopped because of the side effects.  He endorsed increased prolactin level which also Risperdal .  He has history of using Xanax, Valium and Klonopin.   Anxiety: Yes Bipolar Disorder: Yes Depression: Yes Mania: Yes Psychosis: Yes Schizophrenia: No Personality Disorder: No Hospitalization for psychiatric illness: Yes History of Electroconvulsive Shock Therapy: No Prior Suicide Attempts: Yes  Medical History; Patient has multiple medical problems.  He has hypertension, diabetes mellitus , asthma, GERD, chronic pain, degenerative disc disease, fibromyalgia, low testosterone, hyperlipidemia and allergic rhinitis.  His primary care physician is Dr. Conley Canal and he see Dr. Dwyane Dee for her diabetes.  Review of Systems  Constitutional: Positive for weight loss.  Skin: Negative for itching and rash.  Psychiatric/Behavioral: Negative for suicidal ideas, hallucinations and substance abuse.     Psychiatric: Agitation: No Hallucination: No Depressed Mood: No Insomnia: No Hypersomnia: No Altered Concentration: No Feels Worthless: No Grandiose Ideas: No Belief In Special Powers: No New/Increased Substance Abuse: No Compulsions: No  Neurologic: Headache: No Seizure: No Paresthesias:  No    Outpatient Encounter Prescriptions as of 12/19/2014  Medication Sig  . albuterol (PROVENTIL HFA;VENTOLIN HFA) 108 (90 BASE) MCG/ACT inhaler Inhale 2 puffs into the lungs every 6 (six) hours as needed for wheezing or shortness of breath.  Marland Kitchen amoxicillin-clavulanate (AUGMENTIN) 875-125 MG per tablet Take 1 tablet by mouth 2 (two) times daily.  Marland Kitchen augmented betamethasone dipropionate (DIPROLENE-AF) 0.05 % cream Apply 1 application topically 2 (two) times daily as needed. rash  . azelastine (ASTELIN) 0.1 % nasal spray Place 2 sprays into both nostrils 2 (two) times daily. Use in each nostril as directed  . cetirizine (ZYRTEC) 10 MG tablet Take 1 tablet (10 mg total) by mouth daily. For allergies  . DULoxetine (CYMBALTA) 60 MG capsule Take 1 capsule (60 mg total) by mouth 2 (two) times daily.  . ENDOCET 10-325 MG per tablet Take 1 tablet by mouth every 6 (six) hours as needed. for pain  . hydrOXYzine (ATARAX/VISTARIL) 50 MG tablet Take 1 tablet (50 mg total) by mouth daily as needed for anxiety.  Marland Kitchen ibuprofen (ADVIL,MOTRIN) 800 MG tablet Take 1 tablet (800 mg total) by mouth every 8 (eight) hours as needed. (Patient not taking: Reported on 12/12/2014)  . Insulin Glargine (LANTUS SOLOSTAR) 100 UNIT/ML Solostar Pen Inject 30 Units into the skin at bedtime.  . lamoTRIgine (LAMICTAL) 200 MG tablet Take 1 tablet (200 mg total) by mouth daily. Take in the morning for mood control.  . metFORMIN (GLUCOPHAGE) 1000 MG tablet Take 1 tablet (1,000 mg total) by mouth 2 (two) times daily with a meal.  . methylPREDNISolone (MEDROL DOSEPAK) 4 MG tablet follow package directions (Patient not taking: Reported on 12/12/2014)  . Multiple Vitamin (MULTIVITAMIN WITH MINERALS) TABS tablet Take 1 tablet by mouth daily.  Marland Kitchen MYRBETRIQ 25 MG TB24 tablet Take 25 mg by mouth daily.  Marland Kitchen omega-3 acid ethyl esters (LOVAZA) 1 G capsule Take 4 capsules (4 g total) by mouth daily. For high cholesterol/fats (Patient not taking: Reported on  12/12/2014)  . pioglitazone (ACTOS) 30 MG tablet Take 1 tablet (30 mg total) by mouth daily.  . promethazine-dextromethorphan (PROMETHAZINE-DM) 6.25-15 MG/5ML syrup Take 5 mLs by mouth 4 (four) times daily as needed for cough.  . Testosterone 75 MG PLLT Inject 150 mg into the skin.  Marland Kitchen traZODone (DESYREL) 100 MG tablet Take 1 tablet (100 mg total) by mouth at bedtime.  . [DISCONTINUED] diazepam (VALIUM) 5 MG tablet Take 1 tablet 30 minutes prior to MRI.  . [DISCONTINUED] DULoxetine (CYMBALTA) 60 MG capsule Take 1 capsule (60 mg total) by mouth 2 (two) times daily.  . [DISCONTINUED] lamoTRIgine (LAMICTAL) 200 MG tablet Take 1 tablet (200 mg total) by mouth daily. Take in the morning for mood control.  . [DISCONTINUED] simvastatin (ZOCOR) 40 MG tablet Take 1 tablet (40 mg total) by mouth at bedtime. For high cholesterol (Patient not taking: Reported on 12/12/2014)  . [DISCONTINUED] tiZANidine (ZANAFLEX) 4 MG tablet Take 1 tablet (4 mg total) by mouth every 8 (eight) hours as needed for muscle spasms. (Patient not taking: Reported on 12/12/2014)  . [DISCONTINUED] traZODone (DESYREL) 100 MG  tablet Take 1 tablet (100 mg total) by mouth at bedtime.    Recent Results (from the past 2160 hour(s))  Glucose, capillary     Status: Abnormal   Collection Time: 09/20/14 11:44 AM  Result Value Ref Range   Glucose-Capillary 285 (H) 70 - 99 mg/dL   Comment 1 Notify RN   Glucose, capillary     Status: Abnormal   Collection Time: 09/20/14  4:31 PM  Result Value Ref Range   Glucose-Capillary 267 (H) 70 - 99 mg/dL   Comment 1 Notify RN   Glucose, capillary     Status: Abnormal   Collection Time: 09/20/14  9:27 PM  Result Value Ref Range   Glucose-Capillary 216 (H) 70 - 99 mg/dL  Glucose, capillary     Status: Abnormal   Collection Time: 09/21/14  5:55 AM  Result Value Ref Range   Glucose-Capillary 257 (H) 70 - 99 mg/dL  TSH     Status: None   Collection Time: 09/21/14  6:30 AM  Result Value Ref Range   TSH  1.030 0.350 - 4.500 uIU/mL    Comment: Performed at [facility]  Glucose, capillary     Status: Abnormal   Collection Time: 09/21/14 11:58 AM  Result Value Ref Range   Glucose-Capillary 297 (H) 70 - 99 mg/dL   Comment 1 Notify RN   Glucose, capillary     Status: Abnormal   Collection Time: 09/21/14  4:26 PM  Result Value Ref Range   Glucose-Capillary 369 (H) 70 - 99 mg/dL  Glucose, capillary     Status: Abnormal   Collection Time: 09/21/14  8:38 PM  Result Value Ref Range   Glucose-Capillary 159 (H) 70 - 99 mg/dL  Glucose, capillary     Status: Abnormal   Collection Time: 09/22/14  5:49 AM  Result Value Ref Range   Glucose-Capillary 325 (H) 70 - 99 mg/dL  Glucose, capillary     Status: Abnormal   Collection Time: 09/22/14 12:08 PM  Result Value Ref Range   Glucose-Capillary 216 (H) 70 - 99 mg/dL   Comment 1 Notify RN   Glucose, capillary     Status: Abnormal   Collection Time: 09/22/14  5:14 PM  Result Value Ref Range   Glucose-Capillary 229 (H) 70 - 99 mg/dL   Comment 1 Notify RN   Glucose, capillary     Status: Abnormal   Collection Time: 09/22/14  9:10 PM  Result Value Ref Range   Glucose-Capillary 310 (H) 70 - 99 mg/dL  Glucose, capillary     Status: Abnormal   Collection Time: 09/23/14  6:12 AM  Result Value Ref Range   Glucose-Capillary 221 (H) 70 - 99 mg/dL  Glucose, capillary     Status: Abnormal   Collection Time: 09/23/14 11:45 AM  Result Value Ref Range   Glucose-Capillary 307 (H) 70 - 99 mg/dL   Comment 1 Notify RN   Glucose, capillary     Status: Abnormal   Collection Time: 09/23/14  4:51 PM  Result Value Ref Range   Glucose-Capillary 469 (H) 70 - 99 mg/dL   Comment 1 Notify RN   Glucose, capillary     Status: Abnormal   Collection Time: 09/23/14  6:18 PM  Result Value Ref Range   Glucose-Capillary 253 (H) 70 - 99 mg/dL  Glucose, capillary     Status: Abnormal   Collection Time: 09/23/14  8:33 PM  Result Value Ref Range   Glucose-Capillary  128 (H) 70 -  99 mg/dL  Glucose, capillary     Status: Abnormal   Collection Time: 09/24/14  6:03 AM  Result Value Ref Range   Glucose-Capillary 249 (H) 70 - 99 mg/dL  Glucose, capillary     Status: Abnormal   Collection Time: 09/24/14 11:52 AM  Result Value Ref Range   Glucose-Capillary 318 (H) 70 - 99 mg/dL   Comment 1 Notify RN   Glucose, capillary     Status: Abnormal   Collection Time: 09/24/14  2:33 PM  Result Value Ref Range   Glucose-Capillary 235 (H) 70 - 99 mg/dL  Glucose, capillary     Status: Abnormal   Collection Time: 09/24/14  4:52 PM  Result Value Ref Range   Glucose-Capillary 268 (H) 70 - 99 mg/dL  Glucose, capillary     Status: Abnormal   Collection Time: 09/24/14  9:21 PM  Result Value Ref Range   Glucose-Capillary 243 (H) 70 - 99 mg/dL  Glucose, capillary     Status: Abnormal   Collection Time: 09/25/14  5:51 AM  Result Value Ref Range   Glucose-Capillary 219 (H) 70 - 99 mg/dL  Glucose, capillary     Status: Abnormal   Collection Time: 09/25/14 11:14 AM  Result Value Ref Range   Glucose-Capillary 409 (H) 70 - 99 mg/dL   Comment 1 Notify RN   Glucose, capillary     Status: Abnormal   Collection Time: 09/25/14  1:03 PM  Result Value Ref Range   Glucose-Capillary 316 (H) 70 - 99 mg/dL   Comment 1 Notify RN   Glucose, capillary     Status: Abnormal   Collection Time: 09/25/14  4:42 PM  Result Value Ref Range   Glucose-Capillary 362 (H) 70 - 99 mg/dL   Comment 1 Notify RN   Glucose, capillary     Status: Abnormal   Collection Time: 09/25/14  9:24 PM  Result Value Ref Range   Glucose-Capillary 293 (H) 70 - 99 mg/dL   Comment 1 Notify RN   Glucose, capillary     Status: Abnormal   Collection Time: 09/26/14  6:06 AM  Result Value Ref Range   Glucose-Capillary 216 (H) 70 - 99 mg/dL  Glucose, capillary     Status: Abnormal   Collection Time: 09/26/14 12:02 PM  Result Value Ref Range   Glucose-Capillary 327 (H) 70 - 99 mg/dL  Glucose, capillary      Status: Abnormal   Collection Time: 09/26/14  4:52 PM  Result Value Ref Range   Glucose-Capillary 254 (H) 70 - 99 mg/dL  Glucose, capillary     Status: Abnormal   Collection Time: 09/26/14  8:43 PM  Result Value Ref Range   Glucose-Capillary 236 (H) 70 - 99 mg/dL  Glucose, capillary     Status: Abnormal   Collection Time: 09/27/14  6:05 AM  Result Value Ref Range   Glucose-Capillary 222 (H) 70 - 99 mg/dL  Basic metabolic panel     Status: Abnormal   Collection Time: 09/27/14  6:15 AM  Result Value Ref Range   Sodium 129 (L) 137 - 147 mEq/L   Potassium 4.4 3.7 - 5.3 mEq/L   Chloride 91 (L) 96 - 112 mEq/L   CO2 25 19 - 32 mEq/L   Glucose, Bld 262 (H) 70 - 99 mg/dL   BUN 6 6 - 23 mg/dL   Creatinine, Ser 0.65 0.50 - 1.35 mg/dL   Calcium 9.5 8.4 - 10.5 mg/dL   GFR calc non Af Amer >90 >  90 mL/min   GFR calc Af Amer >90 >90 mL/min    Comment: (NOTE) The eGFR has been calculated using the CKD EPI equation. This calculation has not been validated in all clinical situations. eGFR's persistently <90 mL/min signify possible Chronic Kidney Disease.    Anion gap 13 5 - 15    Comment: Performed at [facility]  Glucose, capillary     Status: Abnormal   Collection Time: 09/27/14 11:39 AM  Result Value Ref Range   Glucose-Capillary 277 (H) 70 - 99 mg/dL  Basic Metabolic Panel (BMET)     Status: Abnormal   Collection Time: 09/28/14  1:37 PM  Result Value Ref Range   Sodium 133 (L) 135 - 145 mEq/L   Potassium 5.0 3.5 - 5.1 mEq/L   Chloride 97 96 - 112 mEq/L   CO2 27 19 - 32 mEq/L   Glucose, Bld 207 (H) 70 - 99 mg/dL   BUN 8 6 - 23 mg/dL   Creatinine, Ser 0.8 0.4 - 1.5 mg/dL   Calcium 9.9 8.4 - 10.5 mg/dL   GFR 113.85 >60.00 mL/min  Magnesium     Status: None   Collection Time: 09/28/14  1:37 PM  Result Value Ref Range   Magnesium 1.9 1.5 - 2.5 mg/dL  Basic metabolic panel     Status: Abnormal   Collection Time: 12/05/14  1:51 PM  Result Value Ref Range   Sodium  130 (L) 135 - 145 mEq/L   Potassium 4.2 3.5 - 5.1 mEq/L   Chloride 96 96 - 112 mEq/L   CO2 25 19 - 32 mEq/L   Glucose, Bld 482 (H) 70 - 99 mg/dL   BUN 14 6 - 23 mg/dL   Creatinine, Ser 0.88 0.40 - 1.50 mg/dL   Calcium 9.5 8.4 - 10.5 mg/dL   GFR 101.90 >60.00 mL/min      Physical Exam: Constitutional:  BP 135/88 mmHg  Pulse 107  Ht 6' 1" (1.854 m)  Wt 180 lb 6.4 oz (81.829 kg)  BMI 23.81 kg/m2  Musculoskeletal: Strength & Muscle Tone: within normal limits Gait & Station: normal Patient leans: N/A  Mental Status Examination;  Patient is casually dressed and fairly groomed.  He maintains fair eye contact.  His speech is slow but coherent.  He described his mood euthymic and his affect is appropriate.  He had lost weight from his last visit. He denies any paranoia , delusions or any obsessive thoughts.  He denies any active or passive suicidal thoughts or homicidal thoughts.  His attention and concentration is okay.  His thought process is logical and goal-directed.  His psychomotor activity is normal.  His fund of knowledge is average.  He is alert and oriented x3.  There were no tremors or shakes present.  His insight judgment and impulse control is good.   Established Problem, Stable/Improving (1), Review of Psycho-Social Stressors (1), Review or order clinical lab tests (1), Decision to obtain old records (1), Review and summation of old records (2), Review of Last Therapy Session (1), Review of Medication Regimen & Side Effects (2) and Review of New Medication or Change in Dosage (2)  Assessment: Axis I: Bipolar disorder NOS, rule out posttraumatic stress disorder  Axis II: Deferred  Axis III:  Past Medical History  Diagnosis Date  . Allergy   . Depression   . GERD (gastroesophageal reflux disease)   . Hyperlipidemia   . Chronic pain disorder     Sees [facility]  .  Urinary incontinence     detrusor instability  . Hypogonadism   . Diabetes mellitus      Type II  . Chronic neck pain   . Bipolar depression    Plan:  I review his blood work results, collateral information .  He's been gradually losing his weight.  He has MRI however results are not discuss with him so far.  I will discontinue Geodon since he had never tried .  He is taking trazodone with good response.  I will continue trazodone 100 mg at bedtime, Lamictal 200 mg daily and Cymbalta 60 mg twice a day.  He has not taken Vistaril on a regular basis and he still has refill remaining.  Discussed medication side effects and benefits specialty rash with Lamictal.  Encouraged to see therapist Melinda Crutch for counseling.  I will see him again in 3 months. Time spent 25 minutes.  More than 50% of the time spent in psychoeducation, counseling and coordination of care.  Discuss safety plan that anytime having active suicidal thoughts or homicidal thoughts then patient need to call 911 or go to the local emergency room.  Cynda Soule T., MD 12/19/2014

## 2014-12-20 ENCOUNTER — Ambulatory Visit (INDEPENDENT_AMBULATORY_CARE_PROVIDER_SITE_OTHER): Payer: 59 | Admitting: Family

## 2014-12-20 ENCOUNTER — Encounter: Payer: Self-pay | Admitting: Family

## 2014-12-20 VITALS — BP 122/68 | HR 71 | Temp 98.1°F | Resp 16 | Ht 72.0 in | Wt 182.1 lb

## 2014-12-20 DIAGNOSIS — R911 Solitary pulmonary nodule: Secondary | ICD-10-CM | POA: Diagnosis not present

## 2014-12-20 DIAGNOSIS — R634 Abnormal weight loss: Secondary | ICD-10-CM | POA: Diagnosis not present

## 2014-12-20 DIAGNOSIS — N2889 Other specified disorders of kidney and ureter: Secondary | ICD-10-CM

## 2014-12-20 NOTE — Progress Notes (Signed)
Pre visit review using our clinic review tool, if applicable. No additional management support is needed unless otherwise documented below in the visit note. 

## 2014-12-20 NOTE — Assessment & Plan Note (Signed)
4 mm LUL lesion. Pt is a current smoker- discussed smoking cessation.  Results/plan discussed with patient.  Plan follow up CT chest in 1 year.

## 2014-12-20 NOTE — Progress Notes (Signed)
Subjective:    Patient ID: [name], male    DOB: [birth year], 39 y.o.   MRN: [id number]  HPI  Patient here to discuss MRI results. He recently underwent MRI to evaluate pancreatic abnormality noted on his CT abdomen.  Report is reviewed and MRI noted no evidence of pancreatic mass or abnormal enhancement.  Incidental finding was made of 8 mm lesion in the lower pole of the left kidney.  It was felt that this lesion could recommend an angiolipoma. It is recommended that the patient have a follow up renal MRI in 6 months with/without contrast.  On his CT chest there was note of a 4mm LUL lesion. Patient is a current everyday smoker.   Weight loss- his weight has remained stable since his last visit. He has actually gained a pound.    Past Medical History  Diagnosis Date  . Allergy   . Depression   . GERD (gastroesophageal reflux disease)   . Hyperlipidemia   . Chronic pain disorder     Sees [facility]  . Urinary incontinence     detrusor instability  . Hypogonadism   . Diabetes mellitus     Type II  . Chronic neck pain   . Bipolar depression     Review of Systems     Objective:    Physical Exam  Constitutional: He is oriented to person, place, and time. He appears well-developed and well-nourished. No distress.  HENT:  Head: Normocephalic and atraumatic.  Neurological: He is alert and oriented to person, place, and time.  Psychiatric: He has a normal mood and affect. His behavior is normal. Judgment and thought content normal.    BP 122/68 mmHg  Pulse 71  Temp(Src) 98.1 F (36.7 C) (Oral)  Resp 16  Ht 6' (1.829 m)  Wt 182 lb 2 oz (82.611 kg)  BMI 24.70 kg/m2  SpO2 99% Wt Readings from Last 3 Encounters:  12/20/14 182 lb 2 oz (82.611 kg)  12/19/14 180 lb 6.4 oz (81.829 kg)  12/12/14 181 lb 8 oz (82.328 kg)     Lab Results  Component Value Date   WBC 11.8* 09/18/2014   HGB 13.1 09/18/2014   HCT 37.2* 09/18/2014   PLT 374 09/18/2014    GLUCOSE 482* 12/05/2014   CHOL 215* 11/30/2013   TRIG 207.0* 11/30/2013   HDL 44.80 11/30/2013   LDLDIRECT 145.6 11/30/2013   LDLCALC 74 02/04/2012   ALT 18 09/18/2014   AST 17 09/18/2014   NA 130* 12/05/2014   K 4.2 12/05/2014   CL 96 12/05/2014   CREATININE 0.88 12/05/2014   BUN 14 12/05/2014   CO2 25 12/05/2014   TSH 1.030 09/21/2014   HGBA1C 8.5* 08/17/2014   MICROALBUR 1.4 11/30/2013    Ct Chest W Contrast  12/07/2014   CLINICAL DATA:  One hundred weight loss over past year.  Nausea  EXAM: CT CHEST, ABDOMEN, AND PELVIS WITH CONTRAST  TECHNIQUE: Multidetector CT imaging of the chest, abdomen and pelvis was performed following the standard protocol during bolus administration of intravenous contrast. Oral contrast was administered for the CT abdomen and pelvis.  CONTRAST:  OMNIPAQUE IOHEXOL 300 MG/ML  SOLN  COMPARISON:  Chest CT January 17, 2008  FINDINGS: CT CHEST FINDINGS  There is mild bullous disease in the apices bilaterally. On axial slice 28 series 4, there is a 4 mm nodular opacity in the posterior segment of the left upper lobe. The lungs elsewhere are clear. There is  no lung edema or consolidation. There is no appreciable thoracic adenopathy. Pericardium is not thickened. Thyroid appears normal. There is no demonstrable thoracic aortic aneurysm. No pulmonary embolus appreciable. No blastic or lytic bone lesions.  CT ABDOMEN AND PELVIS FINDINGS  Liver is prominent, measuring 17.9 cm in length. No focal liver lesions are identified. Gallbladder wall is not thickened. There is no biliary duct dilatation.  There is decreased attenuation in the head of the pancreas measuring 2.1 x 2.0 cm without pancreatic duct dilatation or significant mass effect. This finding, best appreciated on axial slices 63 through 67, series 2, is concerning for an infiltrating type mass in the pancreas. Pancreas otherwise appears normal.  Spleen and adrenals appear normal. Kidneys bilaterally show no mass  or hydronephrosis on either side. There is no renal or ureteral calculus on either side.  In the pelvis, the urinary bladder is largely decompressed. Urinary bladder wall does appear slightly thickened. There is no pelvic mass or pelvic fluid collection. Appendix is absent. Terminal ileum appears normal.  There is no bowel obstruction.  No free air or portal venous air.  There is no appreciable ascites, adenopathy, or abscess in the abdomen or pelvis. There is no demonstrable abdominal aortic aneurysm. There are no blastic or lytic bone lesions.  IMPRESSION: CT chest: 4 mm nodular opacity in the posterior segment of the left upper lobe. Followup of this nodular opacity should be based on Fleischner Society guidelines. If the patient is at high risk for bronchogenic carcinoma, follow-up chest CT at 1 year is recommended. If the patient is at low risk, no follow-up is needed. This recommendation follows the consensus statement: Guidelines for Management of Small Pulmonary Nodules Detected on CT Scans: A Statement from the Fleischner Society as published in Radiology 2005; 237:395-400. No lung edema or consolidation. No appreciable adenopathy.  CT abdomen and pelvis: Decreased attenuation in the head of the pancreas measuring 2.1 x 2.0 cm. An infiltrating type mass in the head of the pancreas must be of concern given this appearance. Note that there is no pancreatic duct dilatation. Given this finding, MR of the pancreas pre and post-contrast is felt to be advisable. Note that by CT, there is no evidence suggesting vascular invasion or adjacent adenopathy.  Study otherwise unremarkable except for prominence of the liver.  These results will be called to the ordering clinician or representative by the Radiologist Assistant, and communication documented in the PACS or zVision Dashboard.   Electronically Signed   By: Bretta BangWilliam  Woodruff M.D.   On: 12/07/2014 10:06   Ct Abdomen Pelvis W Contrast  12/07/2014   CLINICAL  DATA:  One hundred weight loss over past year.  Nausea  EXAM: CT CHEST, ABDOMEN, AND PELVIS WITH CONTRAST  TECHNIQUE: Multidetector CT imaging of the chest, abdomen and pelvis was performed following the standard protocol during bolus administration of intravenous contrast. Oral contrast was administered for the CT abdomen and pelvis.  CONTRAST:  100mL OMNIPAQUE IOHEXOL 300 MG/ML  SOLN  COMPARISON:  Chest CT January 17, 2008  FINDINGS: CT CHEST FINDINGS  There is mild bullous disease in the apices bilaterally. On axial slice 28 series 4, there is a 4 mm nodular opacity in the posterior segment of the left upper lobe. The lungs elsewhere are clear. There is no lung edema or consolidation. There is no appreciable thoracic adenopathy. Pericardium is not thickened. Thyroid appears normal. There is no demonstrable thoracic aortic aneurysm. No pulmonary embolus appreciable. No blastic or lytic bone  lesions.  CT ABDOMEN AND PELVIS FINDINGS  Liver is prominent, measuring 17.9 cm in length. No focal liver lesions are identified. Gallbladder wall is not thickened. There is no biliary duct dilatation.  There is decreased attenuation in the head of the pancreas measuring 2.1 x 2.0 cm without pancreatic duct dilatation or significant mass effect. This finding, best appreciated on axial slices 63 through 67, series 2, is concerning for an infiltrating type mass in the pancreas. Pancreas otherwise appears normal.  Spleen and adrenals appear normal. Kidneys bilaterally show no mass or hydronephrosis on either side. There is no renal or ureteral calculus on either side.  In the pelvis, the urinary bladder is largely decompressed. Urinary bladder wall does appear slightly thickened. There is no pelvic mass or pelvic fluid collection. Appendix is absent. Terminal ileum appears normal.  There is no bowel obstruction.  No free air or portal venous air.  There is no appreciable ascites, adenopathy, or abscess in the abdomen or pelvis. There  is no demonstrable abdominal aortic aneurysm. There are no blastic or lytic bone lesions.  IMPRESSION: CT chest: 4 mm nodular opacity in the posterior segment of the left upper lobe. Followup of this nodular opacity should be based on Fleischner Society guidelines. If the patient is at high risk for bronchogenic carcinoma, follow-up chest CT at 1 year is recommended. If the patient is at low risk, no follow-up is needed. This recommendation follows the consensus statement: Guidelines for Management of Small Pulmonary Nodules Detected on CT Scans: A Statement from the Fleischner Society as published in Radiology 2005; 237:395-400. No lung edema or consolidation. No appreciable adenopathy.  CT abdomen and pelvis: Decreased attenuation in the head of the pancreas measuring 2.1 x 2.0 cm. An infiltrating type mass in the head of the pancreas must be of concern given this appearance. Note that there is no pancreatic duct dilatation. Given this finding, MR of the pancreas pre and post-contrast is felt to be advisable. Note that by CT, there is no evidence suggesting vascular invasion or adjacent adenopathy.  Study otherwise unremarkable except for prominence of the liver.  These results will be called to the ordering clinician or representative by the Radiologist Assistant, and communication documented in the PACS or zVision Dashboard.   Electronically Signed   By: Bretta Bang M.D.   On: 12/07/2014 10:06       Assessment & Plan:   Problem List Items Addressed This Visit    None       Lemont Fillers., NP

## 2014-12-20 NOTE — Assessment & Plan Note (Signed)
Findings discussed with patient today.

## 2014-12-20 NOTE — Assessment & Plan Note (Addendum)
This has stabilized.  Monitor closely. He reports labile blood sugars which may be a contributing factor.  He is instructed to follow up with Dr. Porfirio MylarKuma.   Lab Results  Component Value Date   TSH 1.030 09/21/2014

## 2014-12-21 ENCOUNTER — Ambulatory Visit: Payer: 59 | Admitting: Licensed Clinical Social Worker

## 2014-12-21 ENCOUNTER — Telehealth: Payer: Self-pay | Admitting: Family

## 2014-12-21 MED ORDER — MOXIFLOXACIN HCL 400 MG PO TABS: 400.0000 mg | ORAL_TABLET | Freq: Every day | ORAL | Status: DC

## 2014-12-21 NOTE — Telephone Encounter (Signed)
Pt already had augmentin ----  It should have worked if he is no better than a a different medicine is needed. avelox 400 mg 1 po qd x 10 days  Ov if no better after this

## 2014-12-21 NOTE — Telephone Encounter (Signed)
Caller name:Jeffery Dotson Relationship to patient: self Can be reached:CBR in chart  Pharmacy: [facility]  Reason for call: PT still experiencing cough and sinus infection/ mucus - going out of town tomorrow for a week and requesting that amoxicillin-clavulanate (AUGMENTIN) 875-125 MG per tablet to be called in - states he also recvd cough syrup on same day- requesting that as well. Saw Dr. Laury AxonLowne on 12/12/14 regarding.

## 2014-12-21 NOTE — Telephone Encounter (Signed)
Detailed message left advising Rx sent.

## 2014-12-21 NOTE — Telephone Encounter (Signed)
Please advise      KP 

## 2014-12-28 ENCOUNTER — Ambulatory Visit: Payer: 59 | Admitting: Family

## 2015-01-01 DIAGNOSIS — M542 Cervicalgia: Secondary | ICD-10-CM | POA: Diagnosis not present

## 2015-01-01 DIAGNOSIS — G894 Chronic pain syndrome: Secondary | ICD-10-CM | POA: Diagnosis not present

## 2015-01-01 DIAGNOSIS — F329 Major depressive disorder, single episode, unspecified: Secondary | ICD-10-CM | POA: Diagnosis not present

## 2015-01-01 DIAGNOSIS — M5412 Radiculopathy, cervical region: Secondary | ICD-10-CM | POA: Diagnosis not present

## 2015-01-01 DIAGNOSIS — E119 Type 2 diabetes mellitus without complications: Secondary | ICD-10-CM | POA: Diagnosis not present

## 2015-01-01 DIAGNOSIS — M545 Low back pain: Secondary | ICD-10-CM | POA: Diagnosis not present

## 2015-01-01 DIAGNOSIS — F419 Anxiety disorder, unspecified: Secondary | ICD-10-CM | POA: Diagnosis not present

## 2015-01-01 DIAGNOSIS — M47812 Spondylosis without myelopathy or radiculopathy, cervical region: Secondary | ICD-10-CM | POA: Diagnosis not present

## 2015-01-02 ENCOUNTER — Ambulatory Visit: Payer: 59 | Admitting: Licensed Clinical Social Worker

## 2015-01-05 DIAGNOSIS — M79605 Pain in left leg: Secondary | ICD-10-CM | POA: Diagnosis not present

## 2015-01-05 DIAGNOSIS — I10 Essential (primary) hypertension: Secondary | ICD-10-CM | POA: Diagnosis not present

## 2015-01-05 DIAGNOSIS — Z7982 Long term (current) use of aspirin: Secondary | ICD-10-CM | POA: Diagnosis not present

## 2015-01-05 DIAGNOSIS — R110 Nausea: Secondary | ICD-10-CM | POA: Diagnosis not present

## 2015-01-05 DIAGNOSIS — M79604 Pain in right leg: Secondary | ICD-10-CM | POA: Diagnosis not present

## 2015-01-05 DIAGNOSIS — M6281 Muscle weakness (generalized): Secondary | ICD-10-CM | POA: Diagnosis not present

## 2015-01-05 DIAGNOSIS — E0865 Diabetes mellitus due to underlying condition with hyperglycemia: Secondary | ICD-10-CM | POA: Diagnosis not present

## 2015-01-05 DIAGNOSIS — R1011 Right upper quadrant pain: Secondary | ICD-10-CM | POA: Diagnosis not present

## 2015-01-05 DIAGNOSIS — R202 Paresthesia of skin: Secondary | ICD-10-CM | POA: Diagnosis not present

## 2015-01-05 DIAGNOSIS — R630 Anorexia: Secondary | ICD-10-CM | POA: Diagnosis not present

## 2015-01-05 DIAGNOSIS — E1165 Type 2 diabetes mellitus with hyperglycemia: Secondary | ICD-10-CM | POA: Diagnosis not present

## 2015-01-05 DIAGNOSIS — R61 Generalized hyperhidrosis: Secondary | ICD-10-CM | POA: Diagnosis not present

## 2015-01-05 DIAGNOSIS — R531 Weakness: Secondary | ICD-10-CM | POA: Diagnosis not present

## 2015-01-05 DIAGNOSIS — E871 Hypo-osmolality and hyponatremia: Secondary | ICD-10-CM | POA: Diagnosis not present

## 2015-01-06 DIAGNOSIS — R1011 Right upper quadrant pain: Secondary | ICD-10-CM | POA: Diagnosis not present

## 2015-01-06 DIAGNOSIS — R630 Anorexia: Secondary | ICD-10-CM | POA: Diagnosis not present

## 2015-01-06 DIAGNOSIS — R110 Nausea: Secondary | ICD-10-CM | POA: Diagnosis not present

## 2015-01-07 DIAGNOSIS — E1165 Type 2 diabetes mellitus with hyperglycemia: Secondary | ICD-10-CM | POA: Diagnosis not present

## 2015-01-07 DIAGNOSIS — R531 Weakness: Secondary | ICD-10-CM | POA: Diagnosis not present

## 2015-01-07 DIAGNOSIS — Z794 Long term (current) use of insulin: Secondary | ICD-10-CM | POA: Diagnosis not present

## 2015-01-07 DIAGNOSIS — R42 Dizziness and giddiness: Secondary | ICD-10-CM | POA: Diagnosis not present

## 2015-01-07 DIAGNOSIS — G8929 Other chronic pain: Secondary | ICD-10-CM | POA: Diagnosis not present

## 2015-01-07 DIAGNOSIS — F329 Major depressive disorder, single episode, unspecified: Secondary | ICD-10-CM | POA: Diagnosis not present

## 2015-01-08 ENCOUNTER — Encounter: Payer: Self-pay | Admitting: Family

## 2015-01-08 ENCOUNTER — Ambulatory Visit (INDEPENDENT_AMBULATORY_CARE_PROVIDER_SITE_OTHER): Payer: 59 | Admitting: Family

## 2015-01-08 ENCOUNTER — Telehealth: Payer: Self-pay | Admitting: *Deleted

## 2015-01-08 VITALS — BP 138/86 | HR 84 | Temp 98.4°F | Resp 16 | Ht 72.0 in | Wt 182.8 lb

## 2015-01-08 DIAGNOSIS — E1165 Type 2 diabetes mellitus with hyperglycemia: Secondary | ICD-10-CM | POA: Diagnosis not present

## 2015-01-08 DIAGNOSIS — E871 Hypo-osmolality and hyponatremia: Secondary | ICD-10-CM

## 2015-01-08 DIAGNOSIS — IMO0002 Reserved for concepts with insufficient information to code with codable children: Secondary | ICD-10-CM

## 2015-01-08 DIAGNOSIS — F322 Major depressive disorder, single episode, severe without psychotic features: Secondary | ICD-10-CM

## 2015-01-08 MED ORDER — PROMETHAZINE HCL 25 MG PO TABS: 25.0000 mg | ORAL_TABLET | Freq: Three times a day (TID) | ORAL | Status: DC | PRN

## 2015-01-08 MED ORDER — METFORMIN HCL 1000 MG PO TABS: 1000.0000 mg | ORAL_TABLET | Freq: Two times a day (BID) | ORAL | Status: DC

## 2015-01-08 MED ORDER — PIOGLITAZONE HCL 30 MG PO TABS: 30.0000 mg | ORAL_TABLET | Freq: Every day | ORAL | Status: DC

## 2015-01-08 NOTE — Patient Instructions (Signed)
Please schedule AM lab visit at the front desk. Follow up in 6 weeks.

## 2015-01-08 NOTE — Progress Notes (Signed)
Subjective:    Patient ID: [name], male    DOB: 12-07-1975, 39 y.o.   MRN: [id number]  HPI   [name] is a 39 yr old male who presents today for follow up of multiple medical problems:  Depression- reports that this is stable. He is followed by psychiatry.    Hyponatremia- reports that he had weakness/dizziness on Friday.  Reports that it was not his sugar.  He was evaluated in the [facility] ED. Had nausea and vomiting and was told that lipase was mildly elevated.  Lab results are reviewed from [facility] and note lipase of 65.  Reports resolution of abdomianl pain.    DM2- Sugars have been 160 post prandial around 110 fasting. Lab Results  Component Value Date   HGBA1C 8.5* 08/17/2014     Will meet with Dr. Yetta Barre neurosurgery tomorrow about his neck.    Had mildly elevated pancreatic enzyme in Ed, mild nausea, reports zofran not covered by insurance.   Review of Systems    see HPI  Past Medical History  Diagnosis Date  . Allergy   . Depression   . GERD (gastroesophageal reflux disease)   . Hyperlipidemia   . Chronic pain disorder     Sees [facility]  . Urinary incontinence     detrusor instability  . Hypogonadism   . Diabetes mellitus     Type II  . Chronic neck pain   . Bipolar depression     History   Social History  . Marital Status: Married    Spouse Name: N/A  . Number of Children: 2  . Years of Education: N/A   Occupational History  . Disabled     chronic pain   Social History Main Topics  . Smoking status: Current Every Day Smoker -- 2.00 packs/day    Types: Cigarettes    Last Attempt to Quit: 08/29/2013  . Smokeless tobacco: Never Used  . Alcohol Use: 0.0 oz/week    0 Standard drinks or equivalent per week     Comment: Drinking daily x1wk   . Drug Use: Yes    Special: Cocaine  . Sexual Activity: Yes    Birth Control/ Protection: None   Other Topics Concern  . Not on file   Social History Narrative    Past  Surgical History  Procedure Laterality Date  . Appendectomy    . Hernia repair    . Nasal sinus surgery  2008    Family History  Problem Relation Age of Onset  . Diabetes Mother   . Hypertension Mother   . Cirrhosis Mother   . Depression Mother   . Bipolar disorder Mother   . Arthritis Father 49    osteoarthritis  . Bipolar disorder Father   . Diabetes Sister     borderline  . Heart disease Paternal Uncle 90    MI  . Heart disease Maternal Grandfather     late 60's--MI  . Cancer Paternal Grandmother 26    lung  . Bipolar disorder Paternal Grandmother   . Heart disease Paternal Grandfather 45    MI  . Bipolar disorder Paternal Grandfather     Allergies  Allergen Reactions  . Ciprofloxacin Hives  . Hydrocodone     Rash and nausea    Current Outpatient Prescriptions on File Prior to Visit  Medication Sig Dispense Refill  . albuterol (PROVENTIL HFA;VENTOLIN HFA) 108 (90 BASE) MCG/ACT inhaler Inhale 2 puffs into the lungs every 6 (  six) hours as needed for wheezing or shortness of breath. 1 Inhaler 0  . augmented betamethasone dipropionate (DIPROLENE-AF) 0.05 % cream Apply 1 application topically 2 (two) times daily as needed. rash 30 g 0  . azelastine (ASTELIN) 0.1 % nasal spray Place 2 sprays into both nostrils 2 (two) times daily. Use in each nostril as directed 30 mL 12  . cetirizine (ZYRTEC) 10 MG tablet Take 1 tablet (10 mg total) by mouth daily. For allergies    . DULoxetine (CYMBALTA) 60 MG capsule Take 1 capsule (60 mg total) by mouth 2 (two) times daily. 60 capsule 2  . ENDOCET 10-325 MG per tablet Take 1 tablet by mouth every 6 (six) hours as needed. for pain  0  . hydrOXYzine (ATARAX/VISTARIL) 50 MG tablet Take 1 tablet (50 mg total) by mouth daily as needed for anxiety. 30 tablet 0  . Insulin Glargine (LANTUS SOLOSTAR) 100 UNIT/ML Solostar Pen Inject 30 Units into the skin at bedtime. 15 mL 11  . lamoTRIgine (LAMICTAL) 200 MG tablet Take 1 tablet (200 mg total)  by mouth daily. Take in the morning for mood control. 30 tablet 2  . metFORMIN (GLUCOPHAGE) 1000 MG tablet Take 1 tablet (1,000 mg total) by mouth 2 (two) times daily with a meal.    . morphine (MS CONTIN) 30 MG 12 hr tablet Take 30 mg by mouth 3 (three) times daily as needed for pain.    Marland Kitchen. MYRBETRIQ 25 MG TB24 tablet Take 25 mg by mouth daily.  11  . pioglitazone (ACTOS) 30 MG tablet Take 1 tablet (30 mg total) by mouth daily. 30 tablet 3  . Testosterone 75 MG PLLT Inject 150 mg into the skin.    Marland Kitchen. traZODone (DESYREL) 100 MG tablet Take 1 tablet (100 mg total) by mouth at bedtime. 30 tablet 2  . [DISCONTINUED] albuterol (PROVENTIL) 90 MCG/ACT inhaler Inhale 2 puffs into the lungs every 6 (six) hours as needed for wheezing. 17 g 12   No current facility-administered medications on file prior to visit.    BP 138/86 mmHg  Pulse 84  Temp(Src) 98.4 F (36.9 C) (Oral)  Resp 16  Ht 6' (1.829 m)  Wt 182 lb 12.8 oz (82.918 kg)  BMI 24.79 kg/m2  SpO2 98%    Objective:   Physical Exam  Constitutional: He is oriented to person, place, and time. He appears well-developed and well-nourished. No distress.  HENT:  Head: Normocephalic and atraumatic.  Cardiovascular: Normal rate and regular rhythm.   No murmur heard. Pulmonary/Chest: Effort normal and breath sounds normal. No respiratory distress. He has no wheezes. He has no rales.  Musculoskeletal: He exhibits no edema.  Neurological: He is alert and oriented to person, place, and time.  Skin: Skin is warm and dry.  Psychiatric: He has a normal mood and affect. His behavior is normal. Thought content normal.          Assessment & Plan:

## 2015-01-08 NOTE — Telephone Encounter (Signed)
Pt states he has contacted Dr Remus BlakeKumar's office for refills of metformin and actos but requests have been denied. States he has upcoming appt with Dr Lucianne MussKumar and is requesting refill from PCP. Per verbal from PCP, ok to give one time Rx of each. Refills sent. Pt aware.

## 2015-01-08 NOTE — Progress Notes (Signed)
Pre visit review using our clinic review tool, if applicable. No additional management support is needed unless otherwise documented below in the visit note. 

## 2015-01-09 DIAGNOSIS — M25512 Pain in left shoulder: Secondary | ICD-10-CM | POA: Diagnosis not present

## 2015-01-09 DIAGNOSIS — M542 Cervicalgia: Secondary | ICD-10-CM | POA: Diagnosis not present

## 2015-01-09 DIAGNOSIS — G8929 Other chronic pain: Secondary | ICD-10-CM | POA: Diagnosis not present

## 2015-01-09 DIAGNOSIS — G5622 Lesion of ulnar nerve, left upper limb: Secondary | ICD-10-CM | POA: Diagnosis not present

## 2015-01-09 NOTE — Assessment & Plan Note (Signed)
Obtain follow up sodium level, need to also check AM cortisol to rule out adrenal insufficiency.  He is requesting rx for nausea. Short term rx provided for phernergan. He is instructed to avoid driving/working after taking due to risk of drowsiness.

## 2015-01-09 NOTE — Assessment & Plan Note (Signed)
Currently stable, managed by psychiatry.

## 2015-01-09 NOTE — Assessment & Plan Note (Signed)
Reports well controlled sugars at home.  Management per endo.

## 2015-01-10 ENCOUNTER — Other Ambulatory Visit: Payer: 59

## 2015-01-11 ENCOUNTER — Telehealth: Payer: Self-pay | Admitting: Family

## 2015-01-11 DIAGNOSIS — E871 Hypo-osmolality and hyponatremia: Secondary | ICD-10-CM

## 2015-01-11 NOTE — Telephone Encounter (Signed)
[facility] [address] ordered from last office visit.  Re-ordered labs for future with [facility].   Left voicemail for patient to notify him and callback number for any questions.  eal

## 2015-01-11 NOTE — Telephone Encounter (Signed)
Caller name: Whitaker Relation to pt: self Call back number: [phone] Pharmacy:  Reason for call:   Patient states that he is supposed to have labs done from our office but also has [facility] [facility] and would like to know if orders can be sent there. Requesting callback.

## 2015-01-12 ENCOUNTER — Encounter (HOSPITAL_BASED_OUTPATIENT_CLINIC_OR_DEPARTMENT_OTHER): Payer: Self-pay | Admitting: Emergency Medicine

## 2015-01-12 ENCOUNTER — Telehealth: Payer: Self-pay | Admitting: Family

## 2015-01-12 ENCOUNTER — Other Ambulatory Visit: Payer: Self-pay | Admitting: *Deleted

## 2015-01-12 ENCOUNTER — Other Ambulatory Visit: Payer: Self-pay | Admitting: Family

## 2015-01-12 ENCOUNTER — Emergency Department (HOSPITAL_BASED_OUTPATIENT_CLINIC_OR_DEPARTMENT_OTHER)
Admission: EM | Admit: 2015-01-12 | Discharge: 2015-01-12 | Disposition: A | Discharge status: Home / Self Care | Payer: Medicare Other | Attending: Emergency Medicine | Admitting: Emergency Medicine

## 2015-01-12 ENCOUNTER — Telehealth: Payer: Self-pay | Admitting: Endocrinology

## 2015-01-12 ENCOUNTER — Other Ambulatory Visit (HOSPITAL_COMMUNITY): Payer: Self-pay | Admitting: *Deleted

## 2015-01-12 DIAGNOSIS — F319 Bipolar disorder, unspecified: Secondary | ICD-10-CM | POA: Diagnosis not present

## 2015-01-12 DIAGNOSIS — E230 Hypopituitarism: Secondary | ICD-10-CM | POA: Insufficient documentation

## 2015-01-12 DIAGNOSIS — E119 Type 2 diabetes mellitus without complications: Secondary | ICD-10-CM | POA: Insufficient documentation

## 2015-01-12 DIAGNOSIS — Z794 Long term (current) use of insulin: Secondary | ICD-10-CM | POA: Insufficient documentation

## 2015-01-12 DIAGNOSIS — G8929 Other chronic pain: Secondary | ICD-10-CM | POA: Insufficient documentation

## 2015-01-12 DIAGNOSIS — Z8719 Personal history of other diseases of the digestive system: Secondary | ICD-10-CM | POA: Insufficient documentation

## 2015-01-12 DIAGNOSIS — Z79899 Other long term (current) drug therapy: Secondary | ICD-10-CM | POA: Insufficient documentation

## 2015-01-12 DIAGNOSIS — IMO0002 Reserved for concepts with insufficient information to code with codable children: Secondary | ICD-10-CM

## 2015-01-12 DIAGNOSIS — E871 Hypo-osmolality and hyponatremia: Secondary | ICD-10-CM | POA: Insufficient documentation

## 2015-01-12 DIAGNOSIS — F3131 Bipolar disorder, current episode depressed, mild: Secondary | ICD-10-CM

## 2015-01-12 DIAGNOSIS — E291 Testicular hypofunction: Secondary | ICD-10-CM | POA: Diagnosis not present

## 2015-01-12 DIAGNOSIS — E1165 Type 2 diabetes mellitus with hyperglycemia: Secondary | ICD-10-CM

## 2015-01-12 DIAGNOSIS — Z720 Tobacco use: Secondary | ICD-10-CM | POA: Diagnosis not present

## 2015-01-12 DIAGNOSIS — E785 Hyperlipidemia, unspecified: Secondary | ICD-10-CM | POA: Insufficient documentation

## 2015-01-12 DIAGNOSIS — M791 Myalgia: Secondary | ICD-10-CM | POA: Insufficient documentation

## 2015-01-12 DIAGNOSIS — R531 Weakness: Secondary | ICD-10-CM | POA: Diagnosis present

## 2015-01-12 LAB — CBC WITH DIFFERENTIAL/PLATELET
BASOS PCT: 0 % (ref 0–1)
Basophils Absolute: 0.1 10*3/uL (ref 0.0–0.1)
EOS PCT: 1 % (ref 0–5)
Eosinophils Absolute: 0.2 10*3/uL (ref 0.0–0.7)
HEMATOCRIT: 37.1 % — AB (ref 39.0–52.0)
Hemoglobin: 12.6 g/dL — ABNORMAL LOW (ref 13.0–17.0)
Lymphocytes Relative: 6 % — ABNORMAL LOW (ref 12–46)
Lymphs Abs: 1 10*3/uL (ref 0.7–4.0)
MCH: 29.6 pg (ref 26.0–34.0)
MCHC: 34 g/dL (ref 30.0–36.0)
MCV: 87.1 fL (ref 78.0–100.0)
MONOS PCT: 5 % (ref 3–12)
Monocytes Absolute: 0.9 10*3/uL (ref 0.1–1.0)
NEUTROS ABS: 16 10*3/uL — AB (ref 1.7–7.7)
Neutrophils Relative %: 88 % — ABNORMAL HIGH (ref 43–77)
Platelets: 317 10*3/uL (ref 150–400)
RBC: 4.26 MIL/uL (ref 4.22–5.81)
RDW: 13.4 % (ref 11.5–15.5)
WBC: 18.3 10*3/uL — ABNORMAL HIGH (ref 4.0–10.5)

## 2015-01-12 LAB — COMPREHENSIVE METABOLIC PANEL
ALBUMIN: 3.9 g/dL (ref 3.5–5.2)
ALT: 23 U/L (ref 0–53)
ANION GAP: 3 — AB (ref 5–15)
AST: 31 U/L (ref 0–37)
Alkaline Phosphatase: 71 U/L (ref 39–117)
BILIRUBIN TOTAL: 0.3 mg/dL (ref 0.3–1.2)
BUN: 7 mg/dL (ref 6–23)
CHLORIDE: 95 mmol/L — AB (ref 96–112)
CO2: 27 mmol/L (ref 19–32)
Calcium: 8.4 mg/dL (ref 8.4–10.5)
Creatinine, Ser: 0.65 mg/dL (ref 0.50–1.35)
GFR calc non Af Amer: >90 mL/min (ref >90)
GLUCOSE: 138 mg/dL — AB (ref 70–99)
Potassium: 3.8 mmol/L (ref 3.5–5.1)
SODIUM: 125 mmol/L — AB (ref 135–145)
Total Protein: 6.4 g/dL (ref 6.0–8.3)

## 2015-01-12 MED ORDER — HYDROXYZINE HCL 50 MG PO TABS: 50.0000 mg | ORAL_TABLET | Freq: Every day | ORAL | Status: DC | PRN

## 2015-01-12 MED ORDER — SODIUM CHLORIDE 0.9 % IV BOLUS (SEPSIS)
1000.0000 mL | Freq: Once | INTRAVENOUS | Status: AC
  Administered 2015-01-12: 1000 mL via INTRAVENOUS

## 2015-01-12 NOTE — ED Notes (Signed)
Pt states he has history of low sodium levels.  [facility]  Pt having nausea, dizziness, nausea and leg cramps.  Pt states he gets some IV fluids and feels better for a few days but then starts to feel worse.

## 2015-01-12 NOTE — ED Provider Notes (Signed)
CSN: [id number]     Arrival date & time 01/12/15  [id number] History   First MD Initiated Contact with Patient 01/12/15 [phone]     Chief Complaint  Patient presents with  . Weakness   HPI Pt started having trouble with generalized weakness.  He has had some cramping in his arms and legs.  He he has had some persistent nausea as well but has not been vomiting.  He also feels like he has more trouble concentrating. He has a history of low sodium in the past.  He went to [facility] recently and was given IV fluids and he felt better.  He has seen his PCP and is supposed to get some additional testing.  Today he felt worse again so he came to the ED. Past Medical History  Diagnosis Date  . Allergy   . Depression   . GERD (gastroesophageal reflux disease)   . Hyperlipidemia   . Chronic pain disorder     Sees [facility]  . Urinary incontinence     detrusor instability  . Hypogonadism   . Diabetes mellitus     Type II  . Chronic neck pain   . Bipolar depression    Past Surgical History  Procedure Laterality Date  . Appendectomy    . Hernia repair    . Nasal sinus surgery  2008   Family History  Problem Relation Age of Onset  . Diabetes Mother   . Hypertension Mother   . Cirrhosis Mother   . Depression Mother   . Bipolar disorder Mother   . Arthritis Father 49    osteoarthritis  . Bipolar disorder Father   . Diabetes Sister     borderline  . Heart disease Paternal Uncle [id number]    MI  . Heart disease Maternal Grandfather     late 60's--MI  . Cancer Paternal Grandmother [phone]    lung  . Bipolar disorder Paternal Grandmother   . Heart disease Paternal Grandfather [id number]    MI  . Bipolar disorder Paternal Grandfather    History  Substance Use Topics  . Smoking status: Current Every Day Smoker -- 2.00 packs/day    Types: Cigarettes    Last Attempt to Quit: 08/29/2013  . Smokeless tobacco: Never Used  . Alcohol Use: 0.0 oz/week    0 Standard drinks or equivalent per  week     Comment: Drinking daily x1wk     Review of Systems  Constitutional: Negative for fever.  Respiratory: Negative for shortness of breath.   Cardiovascular: Negative for chest pain.  Gastrointestinal: Negative for abdominal pain.  Musculoskeletal: Positive for myalgias.  Neurological: Positive for light-headedness. Negative for headaches.  All other systems reviewed and are negative.     Allergies  Ciprofloxacin  Home Medications   Prior to Admission medications   Medication Sig Start Date End Date Taking? Authorizing Provider  albuterol (PROVENTIL HFA;VENTOLIN HFA) 108 (90 BASE) MCG/ACT inhaler Inhale 2 puffs into the lungs every 6 (six) hours as needed for wheezing or shortness of breath. 08/22/14   Sandford CrazeMelissa O'Sullivan, NP  augmented betamethasone dipropionate (DIPROLENE-AF) 0.05 % cream Apply 1 application topically 2 (two) times daily as needed. rash 08/18/14   Sandford CrazeMelissa O'Sullivan, NP  azelastine (ASTELIN) 0.1 % nasal spray Place 2 sprays into both nostrils 2 (two) times daily. Use in each nostril as directed 09/27/14   Fransisca KaufmannLaura Davis, NP  cetirizine (ZYRTEC) 10 MG tablet Take 1 tablet (10 mg total) by mouth daily. For  allergies 03/23/14   Sanjuana Kava, NP  DULoxetine (CYMBALTA) 60 MG capsule Take 1 capsule (60 mg total) by mouth 2 (two) times daily. 12/19/14   Cleotis Nipper, MD  ENDOCET 10-325 MG per tablet Take 1 tablet by mouth every 6 (six) hours as needed. for pain 11/23/14   Historical Provider, MD  hydrOXYzine (ATARAX/VISTARIL) 50 MG tablet Take 1 tablet (50 mg total) by mouth daily as needed for anxiety. 09/28/14   Cleotis Nipper, MD  Insulin Glargine (LANTUS SOLOSTAR) 100 UNIT/ML Solostar Pen Inject 30 Units into the skin at bedtime. 09/27/14   Fransisca Kaufmann, NP  lamoTRIgine (LAMICTAL) 200 MG tablet Take 1 tablet (200 mg total) by mouth daily. Take in the morning for mood control. 12/19/14   Cleotis Nipper, MD  metFORMIN (GLUCOPHAGE) 1000 MG tablet Take 1 tablet (1,000 mg total)  by mouth 2 (two) times daily with a meal. 01/08/15   Sandford Craze, NP  morphine (MS CONTIN) 30 MG 12 hr tablet Take 30 mg by mouth 3 (three) times daily as needed for pain.    Historical Provider, MD  MYRBETRIQ 25 MG TB24 tablet Take 25 mg by mouth daily. 11/04/14   Historical Provider, MD  pioglitazone (ACTOS) 30 MG tablet Take 1 tablet (30 mg total) by mouth daily. 01/08/15   Sandford Craze, NP  promethazine (PHENERGAN) 25 MG tablet Take 1 tablet (25 mg total) by mouth every 8 (eight) hours as needed for nausea or vomiting. 01/08/15   Sandford Craze, NP  Testosterone 75 MG PLLT Inject 150 mg into the skin. 09/15/14   Historical Provider, MD  traZODone (DESYREL) 100 MG tablet Take 1 tablet (100 mg total) by mouth at bedtime. 12/19/14   Cleotis Nipper, MD   BP 142/89 mmHg  Pulse 100  Temp(Src) 98.2 F (36.8 C) (Oral)  Resp 16  Wt 182 lb (82.555 kg)  SpO2 100% Physical Exam  Constitutional: He appears well-developed and well-nourished. No distress.  HENT:  Head: Normocephalic and atraumatic.  Right Ear: External ear normal.  Left Ear: External ear normal.  Eyes: Conjunctivae are normal. Right eye exhibits no discharge. Left eye exhibits no discharge. No scleral icterus.  Neck: Neck supple. No tracheal deviation present.  Cardiovascular: Normal rate, regular rhythm and intact distal pulses.   Pulmonary/Chest: Effort normal and breath sounds normal. No stridor. No respiratory distress. He has no wheezes. He has no rales.  Abdominal: Soft. Bowel sounds are normal. He exhibits no distension. There is no tenderness. There is no rebound and no guarding.  Musculoskeletal: He exhibits no edema or tenderness.  Neurological: He is alert. He has normal strength. No cranial nerve deficit (no facial droop, extraocular movements intact, no slurred speech) or sensory deficit. He exhibits normal muscle tone. He displays no seizure activity. Coordination normal.  Skin: Skin is warm and dry. No rash  noted.  Psychiatric: He has a normal mood and affect.  Nursing note and vitals reviewed.   ED Course  Procedures (including critical care time) Labs Review Labs Reviewed  CBC WITH DIFFERENTIAL/PLATELET - Abnormal; Notable for the following:    WBC 18.3 (*)    Hemoglobin 12.6 (*)    HCT 37.1 (*)    Neutrophils Relative % 88 (*)    Neutro Abs 16.0 (*)    Lymphocytes Relative 6 (*)    All other components within normal limits  COMPREHENSIVE METABOLIC PANEL - Abnormal; Notable for the following:    Sodium 125 (*)  Chloride 95 (*)    Glucose, Bld 138 (*)    Anion gap 3 (*)    All other components within normal limits     MDM   Final diagnoses:  Hyponatremia   The patient does feel better after IV fluids. He does have a history of hyponatremia. His sodium is lower than it was back in January.  Patient is getting worked up for his hyponatremia with his primary doctor. It is possible this could be a renal cause.  At this time there does not appear to be any evidence of an acute emergency medical condition and the patient appears stable for discharge with appropriate outpatient follow up.     Linwood Dibbles, MD 01/12/15 1057

## 2015-01-12 NOTE — Telephone Encounter (Signed)
Needs ed follow up visit next week.

## 2015-01-12 NOTE — Telephone Encounter (Signed)
Dr. Lolly MustacheArfeen stated verbally if patient need refill he will give it to him but thought he was not taking it that often, just call patient and ask.  Per Dr. Lolly MustacheArfeen if patient need medication then it is okay to do one time refill with no additional refills.   I called patient and per patient he only takes the Hydroxyzine when his anxiety gets bad, does not take everyday.  Patient stated he is completely out of medication,  I advised patient Dr. Lolly MustacheArfeen approved one refill with no additional refills and I will send it to [place] [facility].  Patient verbalized understanding.    Plan:  I review his blood work results, collateral information . He's been gradually losing his weight. He has MRI however results are not discuss with him so far. I will discontinue Geodon since he had never tried . He is taking trazodone with good response. I will continue trazodone 100 mg at bedtime, Lamictal 200 mg daily and Cymbalta 60 mg twice a day. He has not taken Vistaril on a regular basis and he still has refill remaining. Discussed medication side effects and benefits specialty rash with Lamictal. Encouraged to see therapist Rodman CompJulie Witt for counseling. I will see him again in 3 months. Time spent 25 minutes. More than 50% of the time spent in psychoeducation, counseling and coordination of care. Discuss safety plan that anytime having active suicidal thoughts or homicidal thoughts then patient need to call 911 or go to the local emergency room.  ARFEEN,SYED T., MD 12/19/2014

## 2015-01-12 NOTE — Discharge Instructions (Signed)
Hyponatremia  °Hyponatremia is when the amount of salt (sodium) in your blood is too low. When sodium levels are low, your cells will absorb extra water and swell. The swelling happens throughout the body, but it mostly affects the brain. Severe brain swelling (cerebral edema), seizures, or coma can happen.  °CAUSES  °· Heart, kidney, or liver problems. °· Thyroid problems. °· Adrenal gland problems. °· Severe vomiting and diarrhea. °· Certain medicines or illegal drugs. °· Dehydration. °· Drinking too much water. °· Low-sodium diet. °SYMPTOMS  °· Nausea and vomiting. °· Confusion. °· Lethargy. °· Agitation. °· Headache. °· Twitching or shaking (seizures). °· Unconsciousness. °· Appetite loss. °· Muscle weakness and cramping. °DIAGNOSIS  °Hyponatremia is identified by a simple blood test. Your caregiver will perform a history and physical exam to try to find the cause and type of hyponatremia. Other tests may be needed to measure the amount of sodium in your blood and urine. °TREATMENT  °Treatment will depend on the cause.  °· Fluids may be given through the vein (IV). °· Medicines may be used to correct the sodium imbalance. If medicines are causing the problem, they will need to be adjusted. °· Water or fluid intake may be restricted to restore proper balance. °The speed of correcting the sodium problem is very important. If the problem is corrected too fast, nerve damage (sometimes unchangeable) can happen. °HOME CARE INSTRUCTIONS  °· Only take medicines as directed by your caregiver. Many medicines can make hyponatremia worse. Discuss all your medicines with your caregiver. °· Carefully follow any recommended diet, including any fluid restrictions. °· You may be asked to repeat lab tests. Follow these directions. °· Avoid alcohol and recreational drugs. °SEEK MEDICAL CARE IF:  °· You develop worsening nausea, fatigue, headache, confusion, or weakness. °· Your original hyponatremia symptoms return. °· You have  problems following the recommended diet. °SEEK IMMEDIATE MEDICAL CARE IF:  °· You have a seizure. °· You faint. °· You have ongoing diarrhea or vomiting. °MAKE SURE YOU:  °· Understand these instructions. °· Will watch your condition. °· Will get help right away if you are not doing well or get worse. °Document Released: 10/17/2002 Document Revised: 01/19/2012 Document Reviewed: 04/13/2011 °ExitCare® Patient Information ©2015 [employer]. This information is not intended to replace advice given to you by your health care provider. Make sure you discuss any questions you have with your health care provider. ° °

## 2015-01-12 NOTE — Telephone Encounter (Signed)
ED follow up scheduled for 01/15/15

## 2015-01-12 NOTE — Telephone Encounter (Signed)
Pt needs lab orders entered for his appt coming up on 3/21.

## 2015-01-12 NOTE — Telephone Encounter (Signed)
Dr. Lolly MustacheArfeen,   [facility] [place] [facility] to [facility] patient's Hydroxyzine.  Patient last seen 12-19-14. Follow up scheduled for 03-19-15. Last office note stated patient was not taking Hydroxyzine all the time and did not need refill at time of office visit. Do you want to refill the medication?  Thank you

## 2015-01-12 NOTE — Telephone Encounter (Signed)
Labs ordered.

## 2015-01-13 DIAGNOSIS — E291 Testicular hypofunction: Secondary | ICD-10-CM | POA: Diagnosis not present

## 2015-01-13 DIAGNOSIS — K219 Gastro-esophageal reflux disease without esophagitis: Secondary | ICD-10-CM | POA: Diagnosis not present

## 2015-01-13 DIAGNOSIS — E119 Type 2 diabetes mellitus without complications: Secondary | ICD-10-CM | POA: Diagnosis not present

## 2015-01-13 DIAGNOSIS — R079 Chest pain, unspecified: Secondary | ICD-10-CM | POA: Diagnosis not present

## 2015-01-13 DIAGNOSIS — R634 Abnormal weight loss: Secondary | ICD-10-CM | POA: Diagnosis not present

## 2015-01-13 DIAGNOSIS — Z8249 Family history of ischemic heart disease and other diseases of the circulatory system: Secondary | ICD-10-CM | POA: Diagnosis not present

## 2015-01-13 DIAGNOSIS — E785 Hyperlipidemia, unspecified: Secondary | ICD-10-CM | POA: Diagnosis not present

## 2015-01-13 DIAGNOSIS — F319 Bipolar disorder, unspecified: Secondary | ICD-10-CM | POA: Diagnosis not present

## 2015-01-13 DIAGNOSIS — A481 Legionnaires' disease: Secondary | ICD-10-CM | POA: Diagnosis not present

## 2015-01-13 DIAGNOSIS — R32 Unspecified urinary incontinence: Secondary | ICD-10-CM | POA: Diagnosis not present

## 2015-01-13 DIAGNOSIS — F17200 Nicotine dependence, unspecified, uncomplicated: Secondary | ICD-10-CM | POA: Diagnosis not present

## 2015-01-13 DIAGNOSIS — Z833 Family history of diabetes mellitus: Secondary | ICD-10-CM | POA: Diagnosis not present

## 2015-01-13 DIAGNOSIS — D72829 Elevated white blood cell count, unspecified: Secondary | ICD-10-CM | POA: Diagnosis not present

## 2015-01-13 DIAGNOSIS — R911 Solitary pulmonary nodule: Secondary | ICD-10-CM | POA: Diagnosis not present

## 2015-01-13 DIAGNOSIS — R112 Nausea with vomiting, unspecified: Secondary | ICD-10-CM | POA: Diagnosis not present

## 2015-01-13 DIAGNOSIS — E871 Hypo-osmolality and hyponatremia: Secondary | ICD-10-CM | POA: Diagnosis not present

## 2015-01-13 DIAGNOSIS — J189 Pneumonia, unspecified organism: Secondary | ICD-10-CM | POA: Diagnosis not present

## 2015-01-13 DIAGNOSIS — R55 Syncope and collapse: Secondary | ICD-10-CM | POA: Diagnosis not present

## 2015-01-13 DIAGNOSIS — F329 Major depressive disorder, single episode, unspecified: Secondary | ICD-10-CM | POA: Diagnosis not present

## 2015-01-13 DIAGNOSIS — R05 Cough: Secondary | ICD-10-CM | POA: Diagnosis not present

## 2015-01-13 DIAGNOSIS — Z794 Long term (current) use of insulin: Secondary | ICD-10-CM | POA: Diagnosis not present

## 2015-01-13 DIAGNOSIS — E11649 Type 2 diabetes mellitus with hypoglycemia without coma: Secondary | ICD-10-CM | POA: Diagnosis not present

## 2015-01-13 DIAGNOSIS — F17210 Nicotine dependence, cigarettes, uncomplicated: Secondary | ICD-10-CM | POA: Diagnosis not present

## 2015-01-13 DIAGNOSIS — R404 Transient alteration of awareness: Secondary | ICD-10-CM | POA: Diagnosis not present

## 2015-01-13 DIAGNOSIS — R933 Abnormal findings on diagnostic imaging of other parts of digestive tract: Secondary | ICD-10-CM | POA: Diagnosis not present

## 2015-01-13 LAB — SODIUM, URINE, RANDOM: Sodium, Ur: 19 mEq/L

## 2015-01-13 LAB — OSMOLALITY, URINE: Osmolality, Ur: 105 mOsm/kg — ABNORMAL LOW (ref 390–1090)

## 2015-01-13 LAB — OSMOLALITY: Osmolality: 272 mOsm/kg — ABNORMAL LOW (ref 275–300)

## 2015-01-15 ENCOUNTER — Encounter: Payer: Self-pay | Admitting: Family

## 2015-01-15 ENCOUNTER — Ambulatory Visit (INDEPENDENT_AMBULATORY_CARE_PROVIDER_SITE_OTHER): Payer: Medicare Other | Admitting: Family

## 2015-01-15 VITALS — BP 132/90 | HR 101 | Temp 98.7°F | Resp 16 | Ht 72.0 in | Wt 177.6 lb

## 2015-01-15 DIAGNOSIS — J13 Pneumonia due to Streptococcus pneumoniae: Secondary | ICD-10-CM | POA: Diagnosis not present

## 2015-01-15 DIAGNOSIS — E871 Hypo-osmolality and hyponatremia: Secondary | ICD-10-CM

## 2015-01-15 DIAGNOSIS — J189 Pneumonia, unspecified organism: Secondary | ICD-10-CM

## 2015-01-15 LAB — CORTISOL: Cortisol, Plasma: 4.2 ug/dL

## 2015-01-15 MED ORDER — CEFUROXIME AXETIL 500 MG PO TABS: 500.0000 mg | ORAL_TABLET | Freq: Two times a day (BID) | ORAL | Status: DC

## 2015-01-15 MED ORDER — AZITHROMYCIN 250 MG PO TABS: ORAL_TABLET | ORAL | Status: DC

## 2015-01-15 NOTE — Patient Instructions (Addendum)
Start zithromax and ceftin. Follow up in AM for lab work. You will be contacted about your referral for pulmonology.  Follow up in 1 month.

## 2015-01-15 NOTE — Progress Notes (Signed)
Pre visit review using our clinic review tool, if applicable. No additional management support is needed unless otherwise documented below in the visit note. 

## 2015-01-15 NOTE — Progress Notes (Signed)
Subjective:    Patient ID: [name], male    DOB: [birth year], 39 y.o.   MRN: [id number]  HPI  Mr. [name] is a 39 yr old male who presents today for hospital follow up. Admitted through ED on Friday and left last night at 9PM. Reports that he has had cough for several weeks. He was being tr.  Reports that he had 2 episodes of hypoglycemia <30 while hospitalized and left AM.   Records are reviewed.    Serum cortisol was 12.2 on 01/13/15.    CT chest noted multifocal pneumonia.  Results:  1. Multifocal pneumonia, viral and atypical bacterial etiologies should be considered.  2. Smoking-related lung disease with emphysema and evidence of bronchitis and respiratory bronchiolitis.  3. 4 mm left upper lobe perifissural nodule. Followup recommended with low dose CT chest in 12 months.  4. Previous described radiographic left hilar nodular opacity corresponds to overlap of left hilar vascular structures.    Reports + hoarseness, breathing is a little tight, cough is some better.   Review of Systems See HPI  Past Medical History  Diagnosis Date  . Allergy   . Depression   . GERD (gastroesophageal reflux disease)   . Hyperlipidemia   . Chronic pain disorder     Sees [facility]  . Urinary incontinence     detrusor instability  . Hypogonadism   . Diabetes mellitus     Type II  . Chronic neck pain   . Bipolar depression     History   Social History  . Marital Status: Married    Spouse Name: N/A  . Number of Children: 2  . Years of Education: N/A   Occupational History  . Disabled     chronic pain   Social History Main Topics  . Smoking status: Current Every Day Smoker -- 2.00 packs/day    Types: Cigarettes    Last Attempt to Quit: 08/29/2013  . Smokeless tobacco: Never Used  . Alcohol Use: 0.0 oz/week    0 Standard drinks or equivalent per week     Comment: Drinking daily x1wk   . Drug Use: Yes    Special: Cocaine  . Sexual Activity: Yes   Birth Control/ Protection: None   Other Topics Concern  . Not on file   Social History Narrative    Past Surgical History  Procedure Laterality Date  . Appendectomy    . Hernia repair    . Nasal sinus surgery  2008    Family History  Problem Relation Age of Onset  . Diabetes Mother   . Hypertension Mother   . Cirrhosis Mother   . Depression Mother   . Bipolar disorder Mother   . Arthritis Father 49    osteoarthritis  . Bipolar disorder Father   . Diabetes Sister     borderline  . Heart disease Paternal Uncle [id number]    MI  . Heart disease Maternal Grandfather     late 60's--MI  . Cancer Paternal Grandmother 1153    lung  . Bipolar disorder Paternal Grandmother   . Heart disease Paternal Grandfather 4252    MI  . Bipolar disorder Paternal Grandfather     Allergies  Allergen Reactions  . Ciprofloxacin Hives    Current Outpatient Prescriptions on File Prior to Visit  Medication Sig Dispense Refill  . albuterol (PROVENTIL HFA;VENTOLIN HFA) 108 (90 BASE) MCG/ACT inhaler Inhale 2 puffs into the lungs every 6 (six) hours as needed for  wheezing or shortness of breath. 1 Inhaler 0  . augmented betamethasone dipropionate (DIPROLENE-AF) 0.05 % cream Apply 1 application topically 2 (two) times daily as needed. rash 30 g 0  . azelastine (ASTELIN) 0.1 % nasal spray Place 2 sprays into both nostrils 2 (two) times daily. Use in each nostril as directed 30 mL 12  . cetirizine (ZYRTEC) 10 MG tablet Take 1 tablet (10 mg total) by mouth daily. For allergies    . DULoxetine (CYMBALTA) 60 MG capsule Take 1 capsule (60 mg total) by mouth 2 (two) times daily. 60 capsule 2  . ENDOCET 10-325 MG per tablet Take 1 tablet by mouth every 6 (six) hours as needed. for pain  0  . hydrOXYzine (ATARAX/VISTARIL) 50 MG tablet Take 1 tablet (50 mg total) by mouth daily as needed for anxiety. 30 tablet 0  . Insulin Glargine (LANTUS SOLOSTAR) 100 UNIT/ML Solostar Pen Inject 30 Units into the skin at bedtime. 15  mL 11  . lamoTRIgine (LAMICTAL) 200 MG tablet Take 1 tablet (200 mg total) by mouth daily. Take in the morning for mood control. 30 tablet 2  . metFORMIN (GLUCOPHAGE) 1000 MG tablet Take 1 tablet (1,000 mg total) by mouth 2 (two) times daily with a meal. 60 tablet 0  . morphine (MS CONTIN) 30 MG 12 hr tablet Take 30 mg by mouth 3 (three) times daily as needed for pain.    Marland Kitchen MYRBETRIQ 25 MG TB24 tablet Take 25 mg by mouth daily.  11  . pioglitazone (ACTOS) 30 MG tablet Take 1 tablet (30 mg total) by mouth daily. 30 tablet 0  . promethazine (PHENERGAN) 25 MG tablet Take 1 tablet (25 mg total) by mouth every 8 (eight) hours as needed for nausea or vomiting. 20 tablet 0  . Testosterone 75 MG PLLT Inject 150 mg into the skin.    Marland Kitchen traZODone (DESYREL) 100 MG tablet Take 1 tablet (100 mg total) by mouth at bedtime. 30 tablet 2  . [DISCONTINUED] albuterol (PROVENTIL) 90 MCG/ACT inhaler Inhale 2 puffs into the lungs every 6 (six) hours as needed for wheezing. 17 g 12   No current facility-administered medications on file prior to visit.    BP 132/90 mmHg  Pulse 101  Temp(Src) 98.7 F (37.1 C) (Oral)  Resp 16  Ht 6' (1.829 m)  Wt 177 lb 9.6 oz (80.559 kg)  BMI 24.08 kg/m2  SpO2 99%       Objective:   Physical Exam  Constitutional: He is oriented to person, place, and time. He appears well-developed and well-nourished. No distress.  HENT:  Head: Normocephalic and atraumatic.  Cardiovascular: Normal rate and regular rhythm.   No murmur heard. Pulmonary/Chest: Effort normal and breath sounds normal. No respiratory distress. He has no rales.  Few scattered wheezes noted  Musculoskeletal: He exhibits no edema.  Neurological: He is alert and oriented to person, place, and time.  Skin: Skin is warm and dry.  Psychiatric: He has a normal mood and affect. His behavior is normal. Thought content normal.          Assessment & Plan:

## 2015-01-16 ENCOUNTER — Other Ambulatory Visit (INDEPENDENT_AMBULATORY_CARE_PROVIDER_SITE_OTHER): Payer: Medicare Other

## 2015-01-16 DIAGNOSIS — E871 Hypo-osmolality and hyponatremia: Secondary | ICD-10-CM

## 2015-01-16 LAB — BASIC METABOLIC PANEL
BUN: 8 mg/dL (ref 6–23)
CALCIUM: 9.6 mg/dL (ref 8.4–10.5)
CO2: 28 mEq/L (ref 19–32)
Chloride: 98 mEq/L (ref 96–112)
Creatinine, Ser: 0.86 mg/dL (ref 0.40–1.50)
GFR: 104.58 mL/min (ref >60.00)
Glucose, Bld: 336 mg/dL — ABNORMAL HIGH (ref 70–99)
Potassium: 4 mEq/L (ref 3.5–5.1)
Sodium: 132 mEq/L — ABNORMAL LOW (ref 135–145)

## 2015-01-16 LAB — CORTISOL: Cortisol, Plasma: 7.5 ug/dL

## 2015-01-16 NOTE — Addendum Note (Signed)
Addended by: Eustace QuailEABOLD, Kajuana Shareef J on: 01/16/2015 09:40 AM   Modules accepted: Orders

## 2015-01-17 ENCOUNTER — Telehealth: Payer: Self-pay | Admitting: Family

## 2015-01-17 NOTE — Telephone Encounter (Signed)
Noted. Pt was seen by Sandford CrazeMelissa O'Sullivan, NP on 01/15/15 for Atypical Pneumonia and Hyponatremia.

## 2015-01-17 NOTE — Telephone Encounter (Signed)
Caller name: Drinda Buttsnnette  [facility] pt[facility]  [phone]   Reason for call:  [facility] with an admittng diagnosis for cough.

## 2015-01-17 NOTE — Telephone Encounter (Signed)
-----   Message from Lowell BoutonJessica E Jones, CMA sent at 01/17/2015  9:20 AM EST ----- Thea SilversmithHi Tadashi Burkel, My name is Shanda BumpsJessica, I work with [employer].  She's off today but I was checking her inbasket.  [facility] with [facility].  I called Mr [name] to see if he would be able to make that appointment.  Mr [name] stated that he is out of town but someone from Pulmonary had called him not long ago and scheduled him to see Dr Sherene SiresWert next week on 3/18.   I did ask the patient if he would like me to see if he can be seen sooner, but he declined.  Just wanted to let you know!  Have a great day, Jessica ----- Message -----    From: Sandford CrazeMelissa O'Sullivan, NP    Sent: 01/16/2015   9:50 PM      To: Julio Sicksammy S Parrett, NP  Tammy,  Could you please help us get him in with you or one of our pulmonologists this week? Ideally in [place], but if he needs to go to [place] that is fine too.   Thanks,  [employer] MillsMelissa

## 2015-01-18 ENCOUNTER — Telehealth: Payer: Self-pay | Admitting: Family

## 2015-01-18 DIAGNOSIS — J189 Pneumonia, unspecified organism: Secondary | ICD-10-CM | POA: Insufficient documentation

## 2015-01-18 NOTE — Telephone Encounter (Signed)
-----   Message from Oretha Milchakesh Alva V, MD sent at 01/17/2015  6:12 PM EST ----- Marcelino DusterMichelle, Can you please double booked for tomorrow afternoon?  RA ----- Message -----    From: Sandford CrazeMelissa O'Sullivan, NP    Sent: 01/15/2015   6:10 PM      To: Oretha Milchakesh Alva V, MD  Hello,  Any chance you can see this pt this week in [place]? Atypical PNA.  Thanks,  [employer] MillsMelissa

## 2015-01-18 NOTE — Assessment & Plan Note (Signed)
Cortisol level is normal.  Follow up sodium level is improved.  Monitor.

## 2015-01-18 NOTE — Assessment & Plan Note (Signed)
Appears clinically stable. Needs pulmonary consult.  Will start zpak and ceftin.  Pt advised to go to ER if symptoms worsen.

## 2015-01-19 ENCOUNTER — Telehealth: Payer: Self-pay | Admitting: Family

## 2015-01-19 NOTE — Telephone Encounter (Signed)
Sodium is much better at 132. (was low at 125). Cortisol is normal. Avoid drinking excessive amounts of water.  Max 64 oz of fluids/day.

## 2015-01-19 NOTE — Telephone Encounter (Signed)
Caller name: jeff Relation to pt: self  Call back number: [phone] Pharmacy:  Reason for call:   Requesting last lab results

## 2015-01-19 NOTE — Telephone Encounter (Signed)
Notified pt. 

## 2015-01-21 ENCOUNTER — Encounter (HOSPITAL_BASED_OUTPATIENT_CLINIC_OR_DEPARTMENT_OTHER): Payer: Self-pay | Admitting: Emergency Medicine

## 2015-01-21 ENCOUNTER — Emergency Department (HOSPITAL_BASED_OUTPATIENT_CLINIC_OR_DEPARTMENT_OTHER)
Admission: EM | Admit: 2015-01-21 | Discharge: 2015-01-21 | Discharge status: Against Medical Advice | Payer: Medicare Other | Attending: Emergency Medicine | Admitting: Emergency Medicine

## 2015-01-21 DIAGNOSIS — Z720 Tobacco use: Secondary | ICD-10-CM | POA: Insufficient documentation

## 2015-01-21 DIAGNOSIS — E119 Type 2 diabetes mellitus without complications: Secondary | ICD-10-CM | POA: Insufficient documentation

## 2015-01-21 DIAGNOSIS — R42 Dizziness and giddiness: Secondary | ICD-10-CM | POA: Diagnosis not present

## 2015-01-21 DIAGNOSIS — G8929 Other chronic pain: Secondary | ICD-10-CM | POA: Insufficient documentation

## 2015-01-21 NOTE — ED Notes (Signed)
Pt reports extremity cramping, dizziness, and reports hx of low sodium

## 2015-01-21 NOTE — ED Notes (Signed)
Patient left AMA.  Patient reports he did not want to wait any longer.  Reports he will come back in the morning.  Advised patient he should wait to be seen for MSE by EDP.  Patient voiced understanding.

## 2015-01-22 ENCOUNTER — Encounter (HOSPITAL_BASED_OUTPATIENT_CLINIC_OR_DEPARTMENT_OTHER): Payer: Self-pay | Admitting: Emergency Medicine

## 2015-01-22 ENCOUNTER — Emergency Department (HOSPITAL_BASED_OUTPATIENT_CLINIC_OR_DEPARTMENT_OTHER)
Admission: EM | Admit: 2015-01-22 | Discharge: 2015-01-22 | Disposition: A | Discharge status: Home / Self Care | Payer: Medicare Other | Attending: Emergency Medicine | Admitting: Emergency Medicine

## 2015-01-22 ENCOUNTER — Emergency Department (HOSPITAL_BASED_OUTPATIENT_CLINIC_OR_DEPARTMENT_OTHER): Payer: Medicare Other

## 2015-01-22 DIAGNOSIS — E119 Type 2 diabetes mellitus without complications: Secondary | ICD-10-CM | POA: Diagnosis not present

## 2015-01-22 DIAGNOSIS — Z794 Long term (current) use of insulin: Secondary | ICD-10-CM | POA: Insufficient documentation

## 2015-01-22 DIAGNOSIS — F319 Bipolar disorder, unspecified: Secondary | ICD-10-CM | POA: Diagnosis not present

## 2015-01-22 DIAGNOSIS — G8929 Other chronic pain: Secondary | ICD-10-CM | POA: Insufficient documentation

## 2015-01-22 DIAGNOSIS — E871 Hypo-osmolality and hyponatremia: Secondary | ICD-10-CM

## 2015-01-22 DIAGNOSIS — R42 Dizziness and giddiness: Secondary | ICD-10-CM | POA: Diagnosis present

## 2015-01-22 DIAGNOSIS — Z792 Long term (current) use of antibiotics: Secondary | ICD-10-CM | POA: Insufficient documentation

## 2015-01-22 DIAGNOSIS — Z720 Tobacco use: Secondary | ICD-10-CM | POA: Diagnosis not present

## 2015-01-22 DIAGNOSIS — J9801 Acute bronchospasm: Secondary | ICD-10-CM | POA: Diagnosis not present

## 2015-01-22 DIAGNOSIS — R05 Cough: Secondary | ICD-10-CM | POA: Diagnosis not present

## 2015-01-22 DIAGNOSIS — Z7952 Long term (current) use of systemic steroids: Secondary | ICD-10-CM | POA: Insufficient documentation

## 2015-01-22 DIAGNOSIS — R059 Cough, unspecified: Secondary | ICD-10-CM

## 2015-01-22 DIAGNOSIS — R911 Solitary pulmonary nodule: Secondary | ICD-10-CM | POA: Diagnosis not present

## 2015-01-22 LAB — OSMOLALITY: Osmolality: 274 mOsm/kg — ABNORMAL LOW (ref 275–300)

## 2015-01-22 LAB — CBC WITH DIFFERENTIAL/PLATELET
Basophils Absolute: 0 10*3/uL (ref 0.0–0.1)
Basophils Relative: 1 % (ref 0–1)
Eosinophils Absolute: 0.1 10*3/uL (ref 0.0–0.7)
Eosinophils Relative: 2 % (ref 0–5)
HEMATOCRIT: 37.4 % — AB (ref 39.0–52.0)
HEMOGLOBIN: 12.7 g/dL — AB (ref 13.0–17.0)
Lymphocytes Relative: 15 % (ref 12–46)
Lymphs Abs: 0.9 10*3/uL (ref 0.7–4.0)
MCH: 29.9 pg (ref 26.0–34.0)
MCHC: 34 g/dL (ref 30.0–36.0)
MCV: 88 fL (ref 78.0–100.0)
MONO ABS: 0.6 10*3/uL (ref 0.1–1.0)
MONOS PCT: 10 % (ref 3–12)
NEUTROS ABS: 4.8 10*3/uL (ref 1.7–7.7)
Neutrophils Relative %: 74 % (ref 43–77)
Platelets: 235 10*3/uL (ref 150–400)
RBC: 4.25 MIL/uL (ref 4.22–5.81)
RDW: 13.3 % (ref 11.5–15.5)
WBC: 6.5 10*3/uL (ref 4.0–10.5)

## 2015-01-22 LAB — BASIC METABOLIC PANEL
Anion gap: 11 (ref 5–15)
BUN: 6 mg/dL (ref 6–23)
CALCIUM: 8.9 mg/dL (ref 8.4–10.5)
CO2: 25 mmol/L (ref 19–32)
Chloride: 93 mmol/L — ABNORMAL LOW (ref 96–112)
Creatinine, Ser: 0.69 mg/dL (ref 0.50–1.35)
GFR calc Af Amer: >90 mL/min (ref >90)
GLUCOSE: 271 mg/dL — AB (ref 70–99)
Potassium: 4.4 mmol/L (ref 3.5–5.1)
Sodium: 129 mmol/L — ABNORMAL LOW (ref 135–145)

## 2015-01-22 LAB — URINALYSIS, ROUTINE W REFLEX MICROSCOPIC
BILIRUBIN URINE: NEGATIVE
HGB URINE DIPSTICK: NEGATIVE
KETONES UR: NEGATIVE mg/dL
Leukocytes, UA: NEGATIVE
Nitrite: NEGATIVE
PH: 6.5 (ref 5.0–8.0)
Protein, ur: NEGATIVE mg/dL
SPECIFIC GRAVITY, URINE: 1.01 (ref 1.005–1.030)
Urobilinogen, UA: 0.2 mg/dL (ref 0.0–1.0)

## 2015-01-22 LAB — URINE MICROSCOPIC-ADD ON

## 2015-01-22 LAB — CORTISOL: Cortisol, Plasma: 14.2 ug/dL

## 2015-01-22 MED ORDER — ALBUTEROL SULFATE (2.5 MG/3ML) 0.083% IN NEBU
2.5000 mg | INHALATION_SOLUTION | RESPIRATORY_TRACT | Status: DC | PRN
  Administered 2015-01-22: 2.5 mg via RESPIRATORY_TRACT
  Filled 2015-01-22: qty 3

## 2015-01-22 MED ORDER — PREDNISONE 20 MG PO TABS: 20.0000 mg | ORAL_TABLET | Freq: Every day | ORAL | Status: DC

## 2015-01-22 MED ORDER — METHYLPREDNISOLONE SODIUM SUCC 125 MG IJ SOLR
125.0000 mg | Freq: Once | INTRAMUSCULAR | Status: AC
  Administered 2015-01-22: 125 mg via INTRAVENOUS
  Filled 2015-01-22: qty 2

## 2015-01-22 MED ORDER — SODIUM CHLORIDE 0.9 % IV BOLUS (SEPSIS)
2000.0000 mL | Freq: Once | INTRAVENOUS | Status: AC
  Administered 2015-01-22: 1000 mL via INTRAVENOUS

## 2015-01-22 MED ORDER — SODIUM CHLORIDE 0.9 % IV BOLUS (SEPSIS)
1000.0000 mL | Freq: Once | INTRAVENOUS | Status: DC
  Administered 2015-01-22: 1000 mL via INTRAVENOUS

## 2015-01-22 NOTE — ED Notes (Signed)
States he has to leave and pick up his child from daycare. Fluids are almost complete.

## 2015-01-22 NOTE — ED Provider Notes (Addendum)
CSN: [id number]     Arrival date & time 01/22/15  1323 History   First MD Initiated Contact with Patient 01/22/15 1351     Chief Complaint  Patient presents with  . light headed       HPI  Patient presents for evaluation of possible hyponatremia. Has a almost 1 year history of intermittent episodes of hyponatremia.  Has had evaluation for Addison's disease. Has had cortisol testing. He states at times it was thought to be "a renal problem". Has had no other abnormalities of renal function. States he can usually tell when he is getting hyponatremic as he will feel weak and feel "fuzzy headed". Has been lightheaded for the last 2 days. He was admitted last week for a multilobar pneumonia at [facility]. Has follow-up appointment tomorrow with Dr. Sherene Sires of [place] [facility]. He finished 5 days of Zithromax, and 10 days of Ceftin. He was negative for Legionella antigen testing there. Also had ormal cortisol, TSH, Oslolality/    Has been fluid restricted as an out patient at times, with minimal improvement.  Question patient's compliance, based on his description of this.  Past Medical History  Diagnosis Date  . Allergy   . Depression   . GERD (gastroesophageal reflux disease)   . Hyperlipidemia   . Chronic pain disorder     Sees [facility]  . Urinary incontinence     detrusor instability  . Hypogonadism   . Diabetes mellitus     Type II  . Chronic neck pain   . Bipolar depression    Past Surgical History  Procedure Laterality Date  . Appendectomy    . Hernia repair    . Nasal sinus surgery  2008   Family History  Problem Relation Age of Onset  . Diabetes Mother   . Hypertension Mother   . Cirrhosis Mother   . Depression Mother   . Bipolar disorder Mother   . Arthritis Father 49    osteoarthritis  . Bipolar disorder Father   . Diabetes Sister     borderline  . Heart disease Paternal Uncle 21    MI  . Heart disease Maternal Grandfather     late 60's--MI   . Cancer Paternal Grandmother 6    lung  . Bipolar disorder Paternal Grandmother   . Heart disease Paternal Grandfather 44    MI  . Bipolar disorder Paternal Grandfather    History  Substance Use Topics  . Smoking status: Current Every Day Smoker -- 2.00 packs/day    Types: Cigarettes    Last Attempt to Quit: 08/29/2013  . Smokeless tobacco: Never Used  . Alcohol Use: 0.0 oz/week    0 Standard drinks or equivalent per week     Comment: Drinking daily x1wk     Review of Systems  Constitutional: Positive for fatigue. Negative for fever, chills, diaphoresis and appetite change.  HENT: Negative for mouth sores, sore throat and trouble swallowing.   Eyes: Negative for visual disturbance.  Respiratory: Positive for cough. Negative for chest tightness, shortness of breath and wheezing.   Cardiovascular: Negative for chest pain.  Gastrointestinal: Negative for nausea, vomiting, abdominal pain, diarrhea and abdominal distention.  Endocrine: Negative for polydipsia, polyphagia and polyuria.  Genitourinary: Negative for dysuria, frequency, hematuria and decreased urine volume.  Musculoskeletal: Negative for gait problem.  Skin: Negative for color change, pallor and rash.  Neurological: Positive for light-headedness. Negative for dizziness, syncope and headaches.  Hematological: Does not bruise/bleed easily.  Psychiatric/Behavioral:  Negative for behavioral problems and confusion.      Allergies  Ciprofloxacin  Home Medications   Prior to Admission medications   Medication Sig Start Date End Date Taking? Authorizing Provider  albuterol (PROVENTIL HFA;VENTOLIN HFA) 108 (90 BASE) MCG/ACT inhaler Inhale 2 puffs into the lungs every 6 (six) hours as needed for wheezing or shortness of breath. 08/22/14   Sandford Craze, NP  augmented betamethasone dipropionate (DIPROLENE-AF) 0.05 % cream Apply 1 application topically 2 (two) times daily as needed. rash 08/18/14   Sandford Craze,  NP  azelastine (ASTELIN) 0.1 % nasal spray Place 2 sprays into both nostrils 2 (two) times daily. Use in each nostril as directed 09/27/14   Thermon Leyland, NP  azithromycin (ZITHROMAX) 250 MG tablet 2 tabs by mouth today, then one tab by mouth once daily for 4 more days 01/15/15   Sandford Craze, NP  cefUROXime (CEFTIN) 500 MG tablet Take 1 tablet (500 mg total) by mouth 2 (two) times daily. 01/15/15   Sandford Craze, NP  cetirizine (ZYRTEC) 10 MG tablet Take 1 tablet (10 mg total) by mouth daily. For allergies 03/23/14   Sanjuana Kava, NP  DULoxetine (CYMBALTA) 60 MG capsule Take 1 capsule (60 mg total) by mouth 2 (two) times daily. 12/19/14   Cleotis Nipper, MD  ENDOCET 10-325 MG per tablet Take 1 tablet by mouth every 6 (six) hours as needed. for pain 11/23/14   Historical Provider, MD  hydrOXYzine (ATARAX/VISTARIL) 50 MG tablet Take 1 tablet (50 mg total) by mouth daily as needed for anxiety. 01/12/15   Cleotis Nipper, MD  Insulin Glargine (LANTUS SOLOSTAR) 100 UNIT/ML Solostar Pen Inject 30 Units into the skin at bedtime. 09/27/14   Thermon Leyland, NP  lamoTRIgine (LAMICTAL) 200 MG tablet Take 1 tablet (200 mg total) by mouth daily. Take in the morning for mood control. 12/19/14   Cleotis Nipper, MD  metFORMIN (GLUCOPHAGE) 1000 MG tablet Take 1 tablet (1,000 mg total) by mouth 2 (two) times daily with a meal. 01/08/15   Sandford Craze, NP  morphine (MS CONTIN) 30 MG 12 hr tablet Take 30 mg by mouth 3 (three) times daily as needed for pain.    Historical Provider, MD  MYRBETRIQ 25 MG TB24 tablet Take 25 mg by mouth daily. 11/04/14   Historical Provider, MD  pioglitazone (ACTOS) 30 MG tablet Take 1 tablet (30 mg total) by mouth daily. 01/08/15   Sandford Craze, NP  predniSONE (DELTASONE) 20 MG tablet Take 1 tablet (20 mg total) by mouth daily with breakfast. 01/22/15   Rolland Porter, MD  promethazine (PHENERGAN) 25 MG tablet Take 1 tablet (25 mg total) by mouth every 8 (eight) hours as needed for nausea  or vomiting. 01/08/15   Sandford Craze, NP  Testosterone 75 MG PLLT Inject 150 mg into the skin. 09/15/14   Historical Provider, MD  traZODone (DESYREL) 100 MG tablet Take 1 tablet (100 mg total) by mouth at bedtime. 12/19/14   Cleotis Nipper, MD   BP 150/89 mmHg  Pulse 121  Temp(Src) 98 F (36.7 C) (Oral)  Resp 18  Ht  (1.854 m)  Wt 175 lb (79.379 kg)  BMI 23.09 kg/m2  SpO2 99% Physical Exam  Constitutional: He is oriented to person, place, and time. He appears well-developed and well-nourished. No distress.  Patient awake alert. Mentating well.  HENT:  Head: Normocephalic.  Eyes: Conjunctivae are normal. Pupils are equal, round, and reactive to light. No scleral icterus.  Neck: Normal range of motion. Neck supple. No thyromegaly present.  Cardiovascular: Normal rate and regular rhythm.  Exam reveals no gallop and no friction rub.   No murmur heard. Pulmonary/Chest: Effort normal and breath sounds normal. No respiratory distress. He has no wheezes. He has no rales.  Diffuse wheezing. Overall good air exchange. No diminished breath sounds. Bases without focal diminished changes. Not tachypneic. No increased work of breathing.  Abdominal: Soft. Bowel sounds are normal. He exhibits no distension. There is no tenderness. There is no rebound.  Musculoskeletal: Normal range of motion.  Neurological: He is alert and oriented to person, place, and time.  Skin: Skin is warm and dry. No rash noted.  No dependent edema.  Psychiatric: He has a normal mood and affect. His behavior is normal.    ED Course  Procedures (including critical care time) Labs Review Labs Reviewed  BASIC METABOLIC PANEL - Abnormal; Notable for the following:    Sodium 129 (*)    Chloride 93 (*)    Glucose, Bld 271 (*)    All other components within normal limits  CBC WITH DIFFERENTIAL/PLATELET - Abnormal; Notable for the following:    Hemoglobin 12.7 (*)    HCT 37.4 (*)    All other components within  normal limits  CORTISOL  LEGIONELLA ANTIGEN, URINE  TSH  OSMOLALITY  URINALYSIS, ROUTINE W REFLEX MICROSCOPIC    Imaging Review No results found.   EKG Interpretation None      MDM   Final diagnoses:  Cough  Hyponatremia  Bronchospasm     Orthostatic with standing heart rate 120. Initial sodium slightly low 129. Renal function preserved with normal creatinine at 0.69. I rechecked a Legionella antigen on his urine.  repeat x-ray, cortisol, TSH. IV fluids. Given nebulized albuterol. We'll recheck.  Patient did not have supple osmolality, or high urine sodium to suggest SIADH was hospitalized. Normal cortisol, and TSH as well. Those medications listed SIADH as a possible side effect. He does have recent bone or infection which can stimulate SIADH as well. Clinically appears well.    CXR without infiltrate.  UA shows dilute urine at SG of 1.010, thus doubt SIADH.   Pt stable and appropriate for DC.  Plan F/U with [facility] at appt tomorrow.  I have written referral to Renal re:  Hyponatremia.   Rolland PorterMark Emie Sommerfeld, MD 01/22/15 1538  Rolland PorterMark Lorn Butcher, MD 01/22/15 [id number]

## 2015-01-22 NOTE — Discharge Instructions (Signed)
Bronchospasm °A bronchospasm is a spasm or tightening of the airways going into the lungs. During a bronchospasm breathing becomes more difficult because the airways get smaller. When this happens there can be coughing, a whistling sound when breathing (wheezing), and difficulty breathing. Bronchospasm is often associated with asthma, but not all patients who experience a bronchospasm have asthma. °CAUSES  °A bronchospasm is caused by inflammation or irritation of the airways. The inflammation or irritation may be triggered by:  °· Allergies (such as to animals, pollen, food, or mold). Allergens that cause bronchospasm may cause wheezing immediately after exposure or many hours later.   °· Infection. Viral infections are believed to be the most common cause of bronchospasm.   °· Exercise.   °· Irritants (such as pollution, cigarette smoke, strong odors, aerosol sprays, and paint fumes).   °· Weather changes. Winds increase molds and pollens in the air. Rain refreshes the air by washing irritants out. Cold air may cause inflammation.   °· Stress and emotional upset.   °SIGNS AND SYMPTOMS  °· Wheezing.   °· Excessive nighttime coughing.   °· Frequent or severe coughing with a simple cold.   °· Chest tightness.   °· Shortness of breath.   °DIAGNOSIS  °Bronchospasm is usually diagnosed through a history and physical exam. Tests, such as chest X-rays, are sometimes done to look for other conditions. °TREATMENT  °· Inhaled medicines can be given to open up your airways and help you breathe. The medicines can be given using either an inhaler or a nebulizer machine. °· Corticosteroid medicines may be given for severe bronchospasm, usually when it is associated with asthma. °HOME CARE INSTRUCTIONS  °· Always have a plan prepared for seeking medical care. Know when to call your health care provider and local emergency services (911 in the [place] Know where you can access local emergency care. °· Only take medicines as  directed by your health care provider. °· If you were prescribed an inhaler or nebulizer machine, ask your health care provider to explain how to use it correctly. Always use a spacer with your inhaler if you were given one. °· It is necessary to remain calm during an attack. Try to relax and breathe more slowly.  °· Control your home environment in the following ways:   °¨ Change your heating and air conditioning filter at least once a month.   °¨ Limit your use of fireplaces and wood stoves. °¨ Do not smoke and do not allow smoking in your home.   °¨ Avoid exposure to perfumes and fragrances.   °¨ Get rid of pests (such as roaches and mice) and their droppings.   °¨ Throw away plants if you see mold on them.   °¨ Keep your house clean and dust free.   °¨ Replace carpet with wood, tile, or vinyl flooring. Carpet can trap dander and dust.   °¨ Use allergy-proof pillows, mattress covers, and box spring covers.   °¨ Wash bed sheets and blankets every week in hot water and dry them in a dryer.   °¨ Use blankets that are made of polyester or cotton.   °¨ Wash hands frequently. °SEEK MEDICAL CARE IF:  °· You have muscle aches.   °· You have chest pain.   °· The sputum changes from clear or white to yellow, green, gray, or bloody.   °· The sputum you cough up gets thicker.   °· There are problems that may be related to the medicine you are given, such as a rash, itching, swelling, or trouble breathing.   °SEEK IMMEDIATE MEDICAL CARE IF:  °· You have worsening wheezing and coughing even   after taking your prescribed medicines.   You have increased difficulty breathing.   You develop severe chest pain. MAKE SURE YOU:   Understand these instructions.  Will watch your condition.  Will get help right away if you are not doing well or get worse. Document Released: 10/30/2003 Document Revised: 11/01/2013 Document Reviewed: 04/18/2013 [facility] Patient Information 2015 [place], [place]. This information is not  intended to replace advice given to you by your health care provider. Make sure you discuss any questions you have with your health care provider.  Hyponatremia  Hyponatremia is when the salt (sodium) in your blood is low. When salt becomes low, your cells take in extra water and puff up (swell). The puffiness can happen in the whole body. It mostly affects the brain and is very serious.  HOME CARE  Only take medicine as told by your doctor.  Follow any diet instructions you were given. This includes limiting how much fluid you drink.  Keep all doctor visits for tests as told.  Avoid alcohol and drugs. GET HELP RIGHT AWAY IF:  You start to twitch and shake (seize).  You pass out (faint).  You continue to have watery poop (diarrhea) or you throw up (vomit).  You feel sick to your stomach (nauseous).  You are tired (fatigued), have a headache, are confused, or feel weak.  Your problems that first brought you to the doctor come back.  You have trouble following your diet instructions. MAKE SURE YOU:   Understand these instructions.  Will watch your condition.  Will get help right away if you are not doing well or get worse. Document Released: 07/09/2011 Document Revised: 01/19/2012 Document Reviewed: 07/09/2011 [facility] Patient Information 2015 [place], [place]. This information is not intended to replace advice given to you by your health care provider. Make sure you discuss any questions you have with your health care provider.

## 2015-01-22 NOTE — ED Notes (Signed)
Pt states he has had issues with his sodium level dropping and he is feeling lightheaded and thinks it has dropped

## 2015-01-23 ENCOUNTER — Encounter: Payer: Self-pay | Admitting: Pulmonary Disease

## 2015-01-23 ENCOUNTER — Ambulatory Visit (INDEPENDENT_AMBULATORY_CARE_PROVIDER_SITE_OTHER): Payer: Medicare Other | Admitting: Pulmonary Disease

## 2015-01-23 ENCOUNTER — Telehealth: Payer: Self-pay | Admitting: Family

## 2015-01-23 VITALS — BP 134/80 | HR 99 | Temp 97.1°F | Ht 73.0 in | Wt 175.4 lb

## 2015-01-23 DIAGNOSIS — J189 Pneumonia, unspecified organism: Secondary | ICD-10-CM

## 2015-01-23 DIAGNOSIS — R911 Solitary pulmonary nodule: Secondary | ICD-10-CM

## 2015-01-23 DIAGNOSIS — E871 Hypo-osmolality and hyponatremia: Secondary | ICD-10-CM | POA: Diagnosis not present

## 2015-01-23 LAB — LEGIONELLA ANTIGEN, URINE

## 2015-01-23 LAB — TSH: TSH: 0.514 u[IU]/mL (ref 0.350–4.500)

## 2015-01-23 NOTE — Telephone Encounter (Signed)
Sure, thanks for letting me know in advance

## 2015-01-23 NOTE — Assessment & Plan Note (Signed)
4mm nodule in left lung - follow up CT scan in sep 2016

## 2015-01-23 NOTE — Assessment & Plan Note (Signed)
Mild changes of emphysema on your CT scan - breathing test in 4 weeks at [facility] (spiro -pre/post) You have to quit smoking Complete antibiotics Take albuterol 2 puffs q6h as needed for wheezing OK to hold off prednisone for now

## 2015-01-23 NOTE — Progress Notes (Signed)
Subjective:    Patient ID: [name], male    DOB: 06-16-1976, 39 y.o.   MRN: [id number]  HPI  PCP - O'sullivan  39 year old heavy smoker presents for evaluation of pulmonary nodule and dyspnea. He reports weight loss and persistent finding of hyponatremia. He had multiple ED visits for IV fluids. I note a history of cocaine abuse, with last urine toxicology in 09/2014 positive for cocaine. He denied this initially, but admitted when confronted with the evidence. 12/07/14 CT chest/abdomen and pelvis showed a 4 mm nodule in left upper lobe. Apical emphysematous changes ,2 cm pancreatic mass was noted. This was not seen on subsequent MRI [facility], chest x-ray showed atypical pneumonia 01/13/15 CT chest suggested infiltrates and right middle lobe, 4 mm nodule was again noted. He was treated with Z-Pak and cefuroxime. He had another ER visit on 01/22/15 where he was given IV fluids, nebulizer and prescription for prednisone which he has not filled. Chest x-ray on 01/22/15 showed resolution of pulmonary infiltrates. Na had dropped to 128 from 132. Cortisol levels have been noted to be as low as 4.2 and as high as 14.2. He sees Dr. Lucianne MussKumar for his diabetes. He smokes 1.5 packs per day, started age 39-about 40 pack years.    He reports shortness of breath with activity and occasional wheezing. His cough is now nonproductive  Past Medical History  Diagnosis Date  . Allergy   . Depression   . GERD (gastroesophageal reflux disease)   . Hyperlipidemia   . Chronic pain disorder     Sees [facility]  . Urinary incontinence     detrusor instability  . Hypogonadism   . Diabetes mellitus     Type II  . Chronic neck pain   . Bipolar depression      Past Surgical History  Procedure Laterality Date  . Appendectomy    . Hernia repair    . Nasal sinus surgery  2008    Allergies  Allergen Reactions  . Ciprofloxacin Hives    History   Social  History  . Marital Status: Married    Spouse Name: N/A  . Number of Children: 2  . Years of Education: N/A   Occupational History  . Disabled     chronic pain   Social History Main Topics  . Smoking status: Current Every Day Smoker -- 2.00 packs/day    Types: Cigarettes    Last Attempt to Quit: 08/29/2013  . Smokeless tobacco: Never Used  . Alcohol Use: 0.0 oz/week    0 Standard drinks or equivalent per week     Comment: Drinking daily x1wk   . Drug Use: Yes    Special: Cocaine  . Sexual Activity: Yes    Birth Control/ Protection: None   Other Topics Concern  . Not on file   Social History Narrative    Family History  Problem Relation Age of Onset  . Diabetes Mother   . Hypertension Mother   . Cirrhosis Mother   . Depression Mother   . Bipolar disorder Mother   . Arthritis Father 49    osteoarthritis  . Bipolar disorder Father   . Diabetes Sister     borderline  . Heart disease Paternal Uncle [phone]    MI  . Heart disease Maternal Grandfather     late 60's--MI  . Cancer Paternal Grandmother [phone]    lung  . Bipolar disorder Paternal Grandmother   .  Heart disease Paternal Grandfather 36    MI  . Bipolar disorder Paternal Grandfather      Review of Systems  Constitutional: Positive for unexpected weight change. Negative for fever.  HENT: Positive for congestion. Negative for dental problem, ear pain, nosebleeds, postnasal drip, rhinorrhea, sinus pressure, sneezing, sore throat and trouble swallowing.   Eyes: Negative for redness and itching.  Respiratory: Positive for cough, choking, chest tightness, shortness of breath and wheezing.   Cardiovascular: Positive for chest pain. Negative for palpitations and leg swelling.  Gastrointestinal: Negative for nausea and vomiting.  Genitourinary: Negative for dysuria.  Musculoskeletal: Negative for joint swelling.  Skin: Negative for rash.  Neurological: Positive for headaches.  Hematological: Does not bruise/bleed  easily.  Psychiatric/Behavioral: Negative for dysphoric mood. The patient is not nervous/anxious.        Objective:   Physical Exam  Gen. Pleasant, well-nourished, in no distress, normal affect ENT - no lesions, no post nasal drip Neck: No JVD, no thyromegaly, no carotid bruits Lungs: no use of accessory muscles, no dullness to percussion, clear without rales or rhonchi  Cardiovascular: Rhythm regular, heart sounds  normal, no murmurs or gallops, no peripheral edema Abdomen: soft and non-tender, no hepatosplenomegaly, BS normal. Musculoskeletal: No deformities, no cyanosis or clubbing Neuro:  alert, non focal        Assessment & Plan:

## 2015-01-23 NOTE — Assessment & Plan Note (Signed)
Adrenal insufficiency is being considered-await endocrine input

## 2015-01-23 NOTE — Patient Instructions (Signed)
4mm nodule in left lung - follow up CT scan in sep 2016 Mild changes of emphysema on your CT scan - breathing test in 4 weeks at [facility] (spiro -pre/post) You have to quit smoking Complete antibiotics Take albuterol 2 puffs q6h as needed for wheezing OK to hold off prednisone for now

## 2015-01-23 NOTE — Telephone Encounter (Signed)
Dr. Lucianne MussKumar,   [name] will be following up with you on 3/21 for his diabetes.  Would you be kind enough to also see him in consultation for possible adrenal insufficiency/hyponatremia? Thank you, Sandford CrazeMelissa O'Sullivan NP

## 2015-01-23 NOTE — Assessment & Plan Note (Signed)
Pulmonary infiltrates have resolved-do not feel like he needs more antibiotics

## 2015-01-25 ENCOUNTER — Other Ambulatory Visit: Payer: Self-pay

## 2015-01-25 ENCOUNTER — Encounter (HOSPITAL_BASED_OUTPATIENT_CLINIC_OR_DEPARTMENT_OTHER): Payer: Self-pay | Admitting: *Deleted

## 2015-01-25 ENCOUNTER — Emergency Department (HOSPITAL_BASED_OUTPATIENT_CLINIC_OR_DEPARTMENT_OTHER)
Admission: EM | Admit: 2015-01-25 | Discharge: 2015-01-25 | Discharge status: Against Medical Advice | Payer: Medicare Other | Attending: Emergency Medicine | Admitting: Emergency Medicine

## 2015-01-25 ENCOUNTER — Telehealth: Payer: Self-pay | Admitting: Family

## 2015-01-25 DIAGNOSIS — Z720 Tobacco use: Secondary | ICD-10-CM | POA: Diagnosis not present

## 2015-01-25 DIAGNOSIS — E119 Type 2 diabetes mellitus without complications: Secondary | ICD-10-CM | POA: Diagnosis not present

## 2015-01-25 DIAGNOSIS — R350 Frequency of micturition: Secondary | ICD-10-CM | POA: Diagnosis not present

## 2015-01-25 DIAGNOSIS — G894 Chronic pain syndrome: Secondary | ICD-10-CM | POA: Diagnosis not present

## 2015-01-25 DIAGNOSIS — R202 Paresthesia of skin: Secondary | ICD-10-CM | POA: Insufficient documentation

## 2015-01-25 DIAGNOSIS — R0789 Other chest pain: Secondary | ICD-10-CM | POA: Insufficient documentation

## 2015-01-25 DIAGNOSIS — R110 Nausea: Secondary | ICD-10-CM | POA: Insufficient documentation

## 2015-01-25 DIAGNOSIS — R42 Dizziness and giddiness: Secondary | ICD-10-CM | POA: Diagnosis not present

## 2015-01-25 DIAGNOSIS — E291 Testicular hypofunction: Secondary | ICD-10-CM | POA: Diagnosis not present

## 2015-01-25 LAB — CBG MONITORING, ED: GLUCOSE-CAPILLARY: 206 mg/dL — AB (ref 70–99)

## 2015-01-25 NOTE — Telephone Encounter (Signed)
Patient Name: [name] DOB: [birth year] Initial Comment Caller states he was given steroid shot last night, elevated blood sugar to 500, went down to 245 fasting, but back up to 400 and rising after eating salad, has pounding headache Nurse Assessment Nurse: Yetta BarreJones, RN, Miranda Date/Time (Eastern Time): 01/25/2015 12:58:38 PM Confirm and document reason for call. If symptomatic, describe symptoms. ---Caller states his BS has been high after getting steroid shot on Monday for resp infection. BS is 389. He is out of Novolog pens. He needs orders and a sliding scale. Has the patient traveled out of the country within the last 30 days? ---Not Applicable Does the patient require triage? ---Yes Related visit to physician within the last 2 weeks? ---Yes Does the PT have any chronic conditions? (i.e. diabetes, asthma, etc.) ---Yes List chronic conditions. ---Diabetes, Guidelines Guideline Title Affirmed Question Affirmed Notes Diabetes - High Blood Sugar [1] Blood glucose > 300 mg/dl (40.916.5 mmol/ l) AND [8][2] two or more times in a row Final Disposition User Call PCP Now Yetta BarreJones, RN, Miranda Comments Please contact caller about ordering Novolog and give him a sliding scale for coverage of high blood sugar due infection and IV steroids on Monday. Pharmacy [facility] [place], [place] Attempt to call the office to give information over the phone, but no answer after holding for 5 min.

## 2015-01-25 NOTE — Telephone Encounter (Signed)
Please advise 

## 2015-01-25 NOTE — ED Notes (Signed)
Pt with multiple complaints of dizzy, nausea, chest tightness and left arm tingling that began a 2-3 hours ago.

## 2015-01-25 NOTE — Telephone Encounter (Signed)
Patient states that he went to the ED a few days ago and was given steroid shot. He thinks that the shot has raised his blood sugar. Last night before bed patient states that his blood sugar was 500. This morning, fasting was 244 and after eating was 400. [facility]

## 2015-01-25 NOTE — ED Notes (Signed)
Pt called to be taken back to room. Registration sts that pt stated he "was going out for some air" and did not return. Pt not found in the waiting room or ER parking lot.

## 2015-01-26 ENCOUNTER — Encounter (HOSPITAL_BASED_OUTPATIENT_CLINIC_OR_DEPARTMENT_OTHER): Payer: Self-pay

## 2015-01-26 ENCOUNTER — Emergency Department (HOSPITAL_BASED_OUTPATIENT_CLINIC_OR_DEPARTMENT_OTHER)
Admission: EM | Admit: 2015-01-26 | Discharge: 2015-01-26 | Disposition: A | Discharge status: Home / Self Care | Payer: Medicare Other | Attending: Emergency Medicine | Admitting: Emergency Medicine

## 2015-01-26 ENCOUNTER — Institutional Professional Consult (permissible substitution): Payer: Self-pay | Admitting: Internal Medicine

## 2015-01-26 DIAGNOSIS — Z8719 Personal history of other diseases of the digestive system: Secondary | ICD-10-CM | POA: Insufficient documentation

## 2015-01-26 DIAGNOSIS — Z720 Tobacco use: Secondary | ICD-10-CM | POA: Diagnosis not present

## 2015-01-26 DIAGNOSIS — Z792 Long term (current) use of antibiotics: Secondary | ICD-10-CM | POA: Insufficient documentation

## 2015-01-26 DIAGNOSIS — E230 Hypopituitarism: Secondary | ICD-10-CM | POA: Diagnosis not present

## 2015-01-26 DIAGNOSIS — G894 Chronic pain syndrome: Secondary | ICD-10-CM | POA: Insufficient documentation

## 2015-01-26 DIAGNOSIS — R42 Dizziness and giddiness: Secondary | ICD-10-CM

## 2015-01-26 DIAGNOSIS — Z79899 Other long term (current) drug therapy: Secondary | ICD-10-CM | POA: Insufficient documentation

## 2015-01-26 DIAGNOSIS — E119 Type 2 diabetes mellitus without complications: Secondary | ICD-10-CM | POA: Insufficient documentation

## 2015-01-26 DIAGNOSIS — Z794 Long term (current) use of insulin: Secondary | ICD-10-CM | POA: Insufficient documentation

## 2015-01-26 DIAGNOSIS — R5383 Other fatigue: Secondary | ICD-10-CM | POA: Diagnosis not present

## 2015-01-26 DIAGNOSIS — E871 Hypo-osmolality and hyponatremia: Secondary | ICD-10-CM | POA: Diagnosis present

## 2015-01-26 LAB — BASIC METABOLIC PANEL
ANION GAP: 10 (ref 5–15)
BUN: 6 mg/dL (ref 6–23)
CO2: 28 mmol/L (ref 19–32)
Calcium: 8.9 mg/dL (ref 8.4–10.5)
Chloride: 97 mmol/L (ref 96–112)
Creatinine, Ser: 0.69 mg/dL (ref 0.50–1.35)
GLUCOSE: 147 mg/dL — AB (ref 70–99)
POTASSIUM: 3.6 mmol/L (ref 3.5–5.1)
Sodium: 135 mmol/L (ref 135–145)

## 2015-01-26 LAB — CBC WITH DIFFERENTIAL/PLATELET
Basophils Absolute: 0 10*3/uL (ref 0.0–0.1)
Basophils Relative: 0 % (ref 0–1)
EOS ABS: 0.1 10*3/uL (ref 0.0–0.7)
Eosinophils Relative: 1 % (ref 0–5)
HCT: 39.3 % (ref 39.0–52.0)
Hemoglobin: 13.5 g/dL (ref 13.0–17.0)
LYMPHS ABS: 3.2 10*3/uL (ref 0.7–4.0)
Lymphocytes Relative: 33 % (ref 12–46)
MCH: 29.4 pg (ref 26.0–34.0)
MCHC: 34.4 g/dL (ref 30.0–36.0)
MCV: 85.6 fL (ref 78.0–100.0)
MONO ABS: 0.1 10*3/uL (ref 0.1–1.0)
Monocytes Relative: 1 % — ABNORMAL LOW (ref 3–12)
Myelocytes: 1 %
NEUTROS ABS: 6.2 10*3/uL (ref 1.7–7.7)
Neutrophils Relative %: 64 % (ref 43–77)
PLATELETS: 346 10*3/uL (ref 150–400)
RBC: 4.59 MIL/uL (ref 4.22–5.81)
RDW: 13.1 % (ref 11.5–15.5)
WBC: 9.6 10*3/uL (ref 4.0–10.5)

## 2015-01-26 MED ORDER — INSULIN ASPART 100 UNIT/ML FLEXPEN: PEN_INJECTOR | SUBCUTANEOUS | Status: DC

## 2015-01-26 MED ORDER — SODIUM CHLORIDE 0.9 % IV BOLUS (SEPSIS)
1000.0000 mL | Freq: Once | INTRAVENOUS | Status: AC
  Administered 2015-01-26: 1000 mL via INTRAVENOUS

## 2015-01-26 NOTE — Telephone Encounter (Signed)
Called to speak with pt and was told that he is in the ER now.

## 2015-01-26 NOTE — Telephone Encounter (Signed)
Noted  

## 2015-01-26 NOTE — ED Notes (Signed)
MD at bedside. 

## 2015-01-26 NOTE — Discharge Instructions (Signed)

## 2015-01-26 NOTE — ED Provider Notes (Signed)
CSN: [id number]     Arrival date & time 01/26/15  1218 History   First MD Initiated Contact with Patient 01/26/15 1234     Chief Complaint  Patient presents with  . Low Sodium      (Consider location/radiation/quality/duration/timing/severity/associated sxs/prior Treatment) The history is provided by the patient.   patient presents with dizziness and lightheadedness. Has been dealing with this over the last year and a half. Unsure of the cause. Her thought adrenal sufficiency versus renal causes. Has had IV fluids several times in the past. Has endocrinology follow-up soon. Has seen pulmonology recently. Recent admission to [facility] for atypical pneumonia. Had CT scan done at that time. Patient states he had 8 L of fluid then. States he feels his sodium is probably 128. States he is lucky if he gets up to 130s. States he gets some lightheadedness. No urinary frequency. He states his chest pain and cough is improved. States his sugars have been running high. You're up to 500s but after taking insulin was 55 this morning and was 110 prior to arrival.  Past Medical History  Diagnosis Date  . Allergy   . Depression   . GERD (gastroesophageal reflux disease)   . Hyperlipidemia   . Chronic pain disorder     Sees [facility]  . Urinary incontinence     detrusor instability  . Hypogonadism   . Diabetes mellitus     Type II  . Chronic neck pain   . Bipolar depression    Past Surgical History  Procedure Laterality Date  . Appendectomy    . Hernia repair    . Nasal sinus surgery  2008   Family History  Problem Relation Age of Onset  . Diabetes Mother   . Hypertension Mother   . Cirrhosis Mother   . Depression Mother   . Bipolar disorder Mother   . Arthritis Father 49    osteoarthritis  . Bipolar disorder Father   . Diabetes Sister     borderline  . Heart disease Paternal Uncle 66    MI  . Heart disease Maternal Grandfather     late 60's--MI  . Cancer  Paternal Grandmother 70    lung  . Bipolar disorder Paternal Grandmother   . Heart disease Paternal Grandfather 30    MI  . Bipolar disorder Paternal Grandfather    History  Substance Use Topics  . Smoking status: Current Every Day Smoker -- 2.00 packs/day    Types: Cigarettes    Last Attempt to Quit: 08/29/2013  . Smokeless tobacco: Never Used  . Alcohol Use: No     Comment: Drinking daily x1wk     Review of Systems  Constitutional: Positive for fatigue. Negative for appetite change.  Respiratory: Negative for cough.   Cardiovascular: Negative for leg swelling.  Gastrointestinal: Negative for nausea and abdominal pain.  Genitourinary: Negative for flank pain.  Musculoskeletal: Negative for back pain.  Skin: Negative for wound.  Neurological: Positive for dizziness and light-headedness.  Hematological: Negative for adenopathy.      Allergies  Ciprofloxacin  Home Medications   Prior to Admission medications   Medication Sig Start Date End Date Taking? Authorizing Provider  albuterol (PROVENTIL HFA;VENTOLIN HFA) 108 (90 BASE) MCG/ACT inhaler Inhale 2 puffs into the lungs every 6 (six) hours as needed for wheezing or shortness of breath. 08/22/14   Sandford Craze, NP  augmented betamethasone dipropionate (DIPROLENE-AF) 0.05 % cream Apply 1 application topically 2 (two) times daily as  needed. rash 08/18/14   Sandford Craze, NP  azelastine (ASTELIN) 0.1 % nasal spray Place 2 sprays into both nostrils 2 (two) times daily. Use in each nostril as directed 09/27/14   Thermon Leyland, NP  azithromycin (ZITHROMAX) 250 MG tablet 2 tabs by mouth today, then one tab by mouth once daily for 4 more days 01/15/15   Sandford Craze, NP  cefUROXime (CEFTIN) 500 MG tablet Take 1 tablet (500 mg total) by mouth 2 (two) times daily. 01/15/15   Sandford Craze, NP  cetirizine (ZYRTEC) 10 MG tablet Take 1 tablet (10 mg total) by mouth daily. For allergies 03/23/14   Sanjuana Kava, NP   DULoxetine (CYMBALTA) 60 MG capsule Take 1 capsule (60 mg total) by mouth 2 (two) times daily. 12/19/14   Cleotis Nipper, MD  hydrOXYzine (ATARAX/VISTARIL) 50 MG tablet Take 1 tablet (50 mg total) by mouth daily as needed for anxiety. 01/12/15   Cleotis Nipper, MD  insulin aspart (NOVOLOG) 100 UNIT/ML FlexPen Inject TID before meals per sliding scale. 01/26/15   Sandford Craze, NP  Insulin Glargine (LANTUS SOLOSTAR) 100 UNIT/ML Solostar Pen Inject 30 Units into the skin at bedtime. 09/27/14   Thermon Leyland, NP  lamoTRIgine (LAMICTAL) 200 MG tablet Take 1 tablet (200 mg total) by mouth daily. Take in the morning for mood control. 12/19/14   Cleotis Nipper, MD  metFORMIN (GLUCOPHAGE) 1000 MG tablet Take 1 tablet (1,000 mg total) by mouth 2 (two) times daily with a meal. 01/08/15   Sandford Craze, NP  pioglitazone (ACTOS) 30 MG tablet Take 1 tablet (30 mg total) by mouth daily. 01/08/15   Sandford Craze, NP  promethazine (PHENERGAN) 25 MG tablet Take 1 tablet (25 mg total) by mouth every 8 (eight) hours as needed for nausea or vomiting. 01/08/15   Sandford Craze, NP  Testosterone 75 MG PLLT Inject 150 mg into the skin. 09/15/14   Historical Provider, MD  traZODone (DESYREL) 100 MG tablet Take 1 tablet (100 mg total) by mouth at bedtime. 12/19/14   Cleotis Nipper, MD   BP 128/82 mmHg  Pulse 103  Temp(Src) 98.7 F (37.1 C) (Oral)  Resp 18  Ht  (1.854 m)  Wt 175 lb (79.379 kg)  BMI 23.09 kg/m2  SpO2 100% Physical Exam  Constitutional: He is oriented to person, place, and time. He appears well-developed and well-nourished.  HENT:  Head: Normocephalic and atraumatic.  Neck: Normal range of motion.  Cardiovascular: Normal rate, regular rhythm and normal heart sounds.   No murmur heard. Pulmonary/Chest: Effort normal and breath sounds normal.  Abdominal: Soft. Bowel sounds are normal. He exhibits no distension. There is no tenderness.  Musculoskeletal: Normal range of motion. He exhibits no  edema.  Neurological: He is alert and oriented to person, place, and time. No cranial nerve deficit.  Skin: Skin is warm and dry.  Psychiatric: He has a normal mood and affect.  Nursing note and vitals reviewed.   ED Course  Procedures (including critical care time) Labs Review Labs Reviewed  CBC WITH DIFFERENTIAL/PLATELET - Abnormal; Notable for the following:    Monocytes Relative 1 (*)    All other components within normal limits  BASIC METABOLIC PANEL - Abnormal; Notable for the following:    Glucose, Bld 147 (*)    All other components within normal limits    Imaging Review No results found.   EKG Interpretation None      MDM   Final diagnoses:  Dizziness    Patient with dizziness that he associated with hypo-natremia. Patient has a history of this. He became somewhat agitated when he had not had his IV fluids initially. States that all he needs for treatment is the fluid. The basic metabolic panel had hemolyzed twice. It came back with a normal sodium. I reviewed fluids are to been started he was given 1 L. Will be discharged home.    Benjiman CoreNathan Boston Cookson, MD 01/26/15 [id number]

## 2015-01-26 NOTE — ED Notes (Signed)
Lab draw hemolyzed for the second time. Will redraw.

## 2015-01-26 NOTE — ED Notes (Signed)
Pt reports 2 day history of low sodium, muscle cramps, chest pressure, hands tingling - reports same s/s with previous low sodium incidents.

## 2015-01-26 NOTE — Telephone Encounter (Signed)
Please advise pt to add novolog sliding scale TID AC meals with following dosing-  sliding scale- check sugar and inject 3 times daily before meals as below:  <150-   Zero units 150-200 2 units 201-250 4 units 251-300 6 units 301-350 8 units 351-400 10 units >400             12 units and contact us.  Call if sugar remains >300 despite use of sliding scale or if <80.

## 2015-01-29 ENCOUNTER — Ambulatory Visit: Payer: Self-pay | Admitting: Endocrinology

## 2015-01-30 ENCOUNTER — Telehealth: Payer: Self-pay | Admitting: Family

## 2015-01-30 ENCOUNTER — Encounter (HOSPITAL_BASED_OUTPATIENT_CLINIC_OR_DEPARTMENT_OTHER): Payer: Self-pay | Admitting: Emergency Medicine

## 2015-01-30 ENCOUNTER — Emergency Department (HOSPITAL_BASED_OUTPATIENT_CLINIC_OR_DEPARTMENT_OTHER)
Admission: EM | Admit: 2015-01-30 | Discharge: 2015-01-30 | Disposition: A | Discharge status: Home / Self Care | Payer: Medicare Other | Attending: Emergency Medicine | Admitting: Emergency Medicine

## 2015-01-30 DIAGNOSIS — G8929 Other chronic pain: Secondary | ICD-10-CM | POA: Insufficient documentation

## 2015-01-30 DIAGNOSIS — F329 Major depressive disorder, single episode, unspecified: Secondary | ICD-10-CM | POA: Insufficient documentation

## 2015-01-30 DIAGNOSIS — M79604 Pain in right leg: Secondary | ICD-10-CM | POA: Diagnosis present

## 2015-01-30 DIAGNOSIS — E860 Dehydration: Secondary | ICD-10-CM | POA: Diagnosis not present

## 2015-01-30 DIAGNOSIS — M791 Myalgia: Secondary | ICD-10-CM | POA: Insufficient documentation

## 2015-01-30 DIAGNOSIS — Z8719 Personal history of other diseases of the digestive system: Secondary | ICD-10-CM | POA: Diagnosis not present

## 2015-01-30 DIAGNOSIS — Z720 Tobacco use: Secondary | ICD-10-CM | POA: Insufficient documentation

## 2015-01-30 DIAGNOSIS — Z79899 Other long term (current) drug therapy: Secondary | ICD-10-CM | POA: Diagnosis not present

## 2015-01-30 DIAGNOSIS — Z794 Long term (current) use of insulin: Secondary | ICD-10-CM | POA: Diagnosis not present

## 2015-01-30 DIAGNOSIS — E119 Type 2 diabetes mellitus without complications: Secondary | ICD-10-CM | POA: Insufficient documentation

## 2015-01-30 DIAGNOSIS — E871 Hypo-osmolality and hyponatremia: Secondary | ICD-10-CM | POA: Diagnosis not present

## 2015-01-30 DIAGNOSIS — G894 Chronic pain syndrome: Secondary | ICD-10-CM

## 2015-01-30 LAB — URINALYSIS, ROUTINE W REFLEX MICROSCOPIC
BILIRUBIN URINE: NEGATIVE
GLUCOSE, UA: 500 mg/dL — AB
HGB URINE DIPSTICK: NEGATIVE
KETONES UR: NEGATIVE mg/dL
Leukocytes, UA: NEGATIVE
Nitrite: NEGATIVE
PH: 7 (ref 5.0–8.0)
Protein, ur: NEGATIVE mg/dL
SPECIFIC GRAVITY, URINE: 1.006 (ref 1.005–1.030)
Urobilinogen, UA: 0.2 mg/dL (ref 0.0–1.0)

## 2015-01-30 LAB — CBC WITH DIFFERENTIAL/PLATELET
BASOS ABS: 0 10*3/uL (ref 0.0–0.1)
Basophils Relative: 1 % (ref 0–1)
EOS ABS: 0.2 10*3/uL (ref 0.0–0.7)
EOS PCT: 2 % (ref 0–5)
HCT: 35.2 % — ABNORMAL LOW (ref 39.0–52.0)
Hemoglobin: 12.2 g/dL — ABNORMAL LOW (ref 13.0–17.0)
LYMPHS ABS: 2.3 10*3/uL (ref 0.7–4.0)
LYMPHS PCT: 28 % (ref 12–46)
MCH: 29.6 pg (ref 26.0–34.0)
MCHC: 34.7 g/dL (ref 30.0–36.0)
MCV: 85.4 fL (ref 78.0–100.0)
Monocytes Absolute: 0.8 10*3/uL (ref 0.1–1.0)
Monocytes Relative: 9 % (ref 3–12)
NEUTROS PCT: 60 % (ref 43–77)
Neutro Abs: 4.9 10*3/uL (ref 1.7–7.7)
Platelets: 361 10*3/uL (ref 150–400)
RBC: 4.12 MIL/uL — AB (ref 4.22–5.81)
RDW: 12.8 % (ref 11.5–15.5)
WBC: 8.2 10*3/uL (ref 4.0–10.5)

## 2015-01-30 LAB — BASIC METABOLIC PANEL
Anion gap: 7 (ref 5–15)
BUN: 5 mg/dL — ABNORMAL LOW (ref 6–23)
CALCIUM: 8.8 mg/dL (ref 8.4–10.5)
CO2: 27 mmol/L (ref 19–32)
Chloride: 94 mmol/L — ABNORMAL LOW (ref 96–112)
Creatinine, Ser: 0.73 mg/dL (ref 0.50–1.35)
GLUCOSE: 308 mg/dL — AB (ref 70–99)
Potassium: 4 mmol/L (ref 3.5–5.1)
Sodium: 128 mmol/L — ABNORMAL LOW (ref 135–145)

## 2015-01-30 LAB — CBG MONITORING, ED: GLUCOSE-CAPILLARY: 276 mg/dL — AB (ref 70–99)

## 2015-01-30 MED ORDER — SODIUM CHLORIDE 0.9 % IV BOLUS (SEPSIS)
1000.0000 mL | Freq: Once | INTRAVENOUS | Status: AC
  Administered 2015-01-30: 1000 mL via INTRAVENOUS

## 2015-01-30 MED ORDER — SODIUM CHLORIDE 1 G PO TABS
1.0000 g | ORAL_TABLET | Freq: Two times a day (BID) | ORAL | Status: DC
Start: 2015-01-30 — End: 2015-07-31
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
## 2015-01-30 NOTE — Discharge Instructions (Signed)
Dehydration, Adult °Dehydration is when you lose more fluids from the body than you take in. Vital organs like the kidneys, brain, and heart cannot function without a proper amount of fluids and salt. Any loss of fluids from the body can cause dehydration.  °CAUSES  °· Vomiting. °· Diarrhea. °· Excessive sweating. °· Excessive urine output. °· Fever. °SYMPTOMS  °Mild dehydration °· Thirst. °· Dry lips. °· Slightly dry mouth. °Moderate dehydration °· Very dry mouth. °· Sunken eyes. °· Skin does not bounce back quickly when lightly pinched and released. °· Dark urine and decreased urine production. °· Decreased tear production. °· Headache. °Severe dehydration °· Very dry mouth. °· Extreme thirst. °· Rapid, weak pulse (more than 100 beats per minute at rest). °· Cold hands and feet. °· Not able to sweat in spite of heat and temperature. °· Rapid breathing. °· Blue lips. °· Confusion and lethargy. °· Difficulty being awakened. °· Minimal urine production. °· No tears. °DIAGNOSIS  °Your caregiver will diagnose dehydration based on your symptoms and your exam. Blood and urine tests will help confirm the diagnosis. The diagnostic evaluation should also identify the cause of dehydration. °TREATMENT  °Treatment of mild or moderate dehydration can often be done at home by increasing the amount of fluids that you drink. It is best to drink small amounts of fluid more often. Drinking too much at one time can make vomiting worse. Refer to the home care instructions below. °Severe dehydration needs to be treated at the hospital where you will probably be given intravenous (IV) fluids that contain water and electrolytes. °HOME CARE INSTRUCTIONS  °· Ask your caregiver about specific rehydration instructions. °· Drink enough fluids to keep your urine clear or pale yellow. °· Drink small amounts frequently if you have nausea and vomiting. °· Eat as you normally do. °· Avoid: °· Foods or drinks high in sugar. °· Carbonated  drinks. °· Juice. °· Extremely hot or cold fluids. °· Drinks with caffeine. °· Fatty, greasy foods. °· Alcohol. °· Tobacco. °· Overeating. °· Gelatin desserts. °· Wash your hands well to avoid spreading bacteria and viruses. °· Only take over-the-counter or prescription medicines for pain, discomfort, or fever as directed by your caregiver. °· Ask your caregiver if you should continue all prescribed and over-the-counter medicines. °· Keep all follow-up appointments with your caregiver. °SEEK MEDICAL CARE IF: °· You have abdominal pain and it increases or stays in one area (localizes). °· You have a rash, stiff neck, or severe headache. °· You are irritable, sleepy, or difficult to awaken. °· You are weak, dizzy, or extremely thirsty. °SEEK IMMEDIATE MEDICAL CARE IF:  °· You are unable to keep fluids down or you get worse despite treatment. °· You have frequent episodes of vomiting or diarrhea. °· You have blood or green matter (bile) in your vomit. °· You have blood in your stool or your stool looks black and tarry. °· You have not urinated in 6 to 8 hours, or you have only urinated a small amount of very dark urine. °· You have a fever. °· You faint. °MAKE SURE YOU:  °· Understand these instructions. °· Will watch your condition. °· Will get help right away if you are not doing well or get worse. °Document Released: 10/27/2005 Document Revised: 01/19/2012 Document Reviewed: 06/16/2011 °[employer]® Patient Information ©2015 [employer]. This information is not intended to replace advice given to you by your health care provider. Make sure you discuss any questions you have with your health care   provider. ° °Hyponatremia  °Hyponatremia is when the amount of salt (sodium) in your blood is too low. When sodium levels are low, your cells will absorb extra water and swell. The swelling happens throughout the body, but it mostly affects the brain. Severe brain swelling (cerebral edema), seizures, or coma can happen.   °CAUSES  °· Heart, kidney, or liver problems. °· Thyroid problems. °· Adrenal gland problems. °· Severe vomiting and diarrhea. °· Certain medicines or illegal drugs. °· Dehydration. °· Drinking too much water. °· Low-sodium diet. °SYMPTOMS  °· Nausea and vomiting. °· Confusion. °· Lethargy. °· Agitation. °· Headache. °· Twitching or shaking (seizures). °· Unconsciousness. °· Appetite loss. °· Muscle weakness and cramping. °DIAGNOSIS  °Hyponatremia is identified by a simple blood test. Your caregiver will perform a history and physical exam to try to find the cause and type of hyponatremia. Other tests may be needed to measure the amount of sodium in your blood and urine. °TREATMENT  °Treatment will depend on the cause.  °· Fluids may be given through the vein (IV). °· Medicines may be used to correct the sodium imbalance. If medicines are causing the problem, they will need to be adjusted. °· Water or fluid intake may be restricted to restore proper balance. °The speed of correcting the sodium problem is very important. If the problem is corrected too fast, nerve damage (sometimes unchangeable) can happen. °HOME CARE INSTRUCTIONS  °· Only take medicines as directed by your caregiver. Many medicines can make hyponatremia worse. Discuss all your medicines with your caregiver. °· Carefully follow any recommended diet, including any fluid restrictions. °· You may be asked to repeat lab tests. Follow these directions. °· Avoid alcohol and recreational drugs. °SEEK MEDICAL CARE IF:  °· You develop worsening nausea, fatigue, headache, confusion, or weakness. °· Your original hyponatremia symptoms return. °· You have problems following the recommended diet. °SEEK IMMEDIATE MEDICAL CARE IF:  °· You have a seizure. °· You faint. °· You have ongoing diarrhea or vomiting. °MAKE SURE YOU:  °· Understand these instructions. °· Will watch your condition. °· Will get help right away if you are not doing well or get  worse. °Document Released: 10/17/2002 Document Revised: 01/19/2012 Document Reviewed: 04/13/2011 °ExitCare® Patient Information ©2015 [employer]. This information is not intended to replace advice given to you by your health care provider. Make sure you discuss any questions you have with your health care provider. ° °

## 2015-01-30 NOTE — ED Notes (Signed)
Pt states he has an family emergency and has to leave. EDP Gentry notified and spoke with pt that he will d/c him so don't leave ama. When I went to give pt his rx and d/c paper work he was not in room. Pt was seen leaving. Pt didn't sign d/c or receive rx; pt stated he had no pain

## 2015-01-30 NOTE — Telephone Encounter (Signed)
Caller name: jeff Relation to pt: self Call back number: [phone] Pharmacy:  Reason for call:   Would like referral to pain mgmt--Dr. hedgecock [address] Demetrius CharityP: [phone]

## 2015-01-30 NOTE — ED Notes (Signed)
Pt reports that he his being followed by NP for low sodium levels, reports leg cramping, hands cramping and nausea

## 2015-01-31 NOTE — Telephone Encounter (Signed)
Noted. I have asked Dr. Lucianne MussKumar to further evaluate his low sodium and he is aware.

## 2015-01-31 NOTE — Telephone Encounter (Signed)
Please see pt's request for Dr. Floy SabinaHegecock below. Thanks.

## 2015-01-31 NOTE — Telephone Encounter (Signed)
Spoke with pt and he states glucose readings are better now. Has appt with endo (Dr Lucianne MussKumar) on Tuesday. Pt states he has been to the ER 2 or 3 times since last week for fluids due to his low sodium level.  Please advise.

## 2015-01-31 NOTE — Telephone Encounter (Signed)
Dr Margarita RanaHedgecock is not accepting pts with his dx, lm on vm awaiting return call

## 2015-02-04 ENCOUNTER — Encounter (HOSPITAL_BASED_OUTPATIENT_CLINIC_OR_DEPARTMENT_OTHER): Payer: Self-pay

## 2015-02-04 ENCOUNTER — Emergency Department (HOSPITAL_BASED_OUTPATIENT_CLINIC_OR_DEPARTMENT_OTHER)
Admission: EM | Admit: 2015-02-04 | Discharge: 2015-02-04 | Disposition: A | Discharge status: Home / Self Care | Payer: Medicare Other | Attending: Emergency Medicine | Admitting: Emergency Medicine

## 2015-02-04 DIAGNOSIS — Z8719 Personal history of other diseases of the digestive system: Secondary | ICD-10-CM | POA: Diagnosis not present

## 2015-02-04 DIAGNOSIS — G8929 Other chronic pain: Secondary | ICD-10-CM | POA: Insufficient documentation

## 2015-02-04 DIAGNOSIS — Z794 Long term (current) use of insulin: Secondary | ICD-10-CM | POA: Diagnosis not present

## 2015-02-04 DIAGNOSIS — Z79899 Other long term (current) drug therapy: Secondary | ICD-10-CM | POA: Diagnosis not present

## 2015-02-04 DIAGNOSIS — Z760 Encounter for issue of repeat prescription: Secondary | ICD-10-CM | POA: Diagnosis present

## 2015-02-04 DIAGNOSIS — F329 Major depressive disorder, single episode, unspecified: Secondary | ICD-10-CM | POA: Insufficient documentation

## 2015-02-04 DIAGNOSIS — Z720 Tobacco use: Secondary | ICD-10-CM | POA: Insufficient documentation

## 2015-02-04 DIAGNOSIS — Z792 Long term (current) use of antibiotics: Secondary | ICD-10-CM | POA: Insufficient documentation

## 2015-02-04 DIAGNOSIS — Z7952 Long term (current) use of systemic steroids: Secondary | ICD-10-CM | POA: Insufficient documentation

## 2015-02-04 DIAGNOSIS — J3489 Other specified disorders of nose and nasal sinuses: Secondary | ICD-10-CM

## 2015-02-04 DIAGNOSIS — J019 Acute sinusitis, unspecified: Secondary | ICD-10-CM | POA: Diagnosis not present

## 2015-02-04 DIAGNOSIS — E119 Type 2 diabetes mellitus without complications: Secondary | ICD-10-CM | POA: Diagnosis not present

## 2015-02-04 LAB — CBG MONITORING, ED
GLUCOSE-CAPILLARY: 357 mg/dL — AB (ref 70–99)
Glucose-Capillary: 292 mg/dL — ABNORMAL HIGH (ref 70–99)

## 2015-02-04 LAB — BASIC METABOLIC PANEL
Anion gap: 10 (ref 5–15)
BUN: 7 mg/dL (ref 6–23)
CALCIUM: 9.2 mg/dL (ref 8.4–10.5)
CO2: 25 mmol/L (ref 19–32)
Chloride: 98 mmol/L (ref 96–112)
Creatinine, Ser: 1.02 mg/dL (ref 0.50–1.35)
GFR calc Af Amer: >90 mL/min (ref >90)
GFR calc non Af Amer: >90 mL/min (ref >90)
GLUCOSE: 389 mg/dL — AB (ref 70–99)
Potassium: 4.3 mmol/L (ref 3.5–5.1)
Sodium: 133 mmol/L — ABNORMAL LOW (ref 135–145)

## 2015-02-04 LAB — CBC WITH DIFFERENTIAL/PLATELET
BASOS ABS: 0.1 10*3/uL (ref 0.0–0.1)
BASOS PCT: 1 % (ref 0–1)
EOS ABS: 0.1 10*3/uL (ref 0.0–0.7)
Eosinophils Relative: 1 % (ref 0–5)
HEMATOCRIT: 37 % — AB (ref 39.0–52.0)
Hemoglobin: 12.6 g/dL — ABNORMAL LOW (ref 13.0–17.0)
LYMPHS ABS: 1.6 10*3/uL (ref 0.7–4.0)
Lymphocytes Relative: 17 % (ref 12–46)
MCH: 30.1 pg (ref 26.0–34.0)
MCHC: 34.1 g/dL (ref 30.0–36.0)
MCV: 88.3 fL (ref 78.0–100.0)
Monocytes Absolute: 0.7 10*3/uL (ref 0.1–1.0)
Monocytes Relative: 8 % (ref 3–12)
Neutro Abs: 6.8 10*3/uL (ref 1.7–7.7)
Neutrophils Relative %: 73 % (ref 43–77)
PLATELETS: 380 10*3/uL (ref 150–400)
RBC: 4.19 MIL/uL — ABNORMAL LOW (ref 4.22–5.81)
RDW: 14.1 % (ref 11.5–15.5)
WBC: 9.3 10*3/uL (ref 4.0–10.5)

## 2015-02-04 LAB — URINALYSIS, ROUTINE W REFLEX MICROSCOPIC
BILIRUBIN URINE: NEGATIVE
Glucose, UA: >1000 mg/dL — AB
Hgb urine dipstick: NEGATIVE
Ketones, ur: NEGATIVE mg/dL
LEUKOCYTES UA: NEGATIVE
Nitrite: NEGATIVE
PH: 6.5 (ref 5.0–8.0)
Protein, ur: NEGATIVE mg/dL
Specific Gravity, Urine: 1.017 (ref 1.005–1.030)
Urobilinogen, UA: 0.2 mg/dL (ref 0.0–1.0)

## 2015-02-04 LAB — URINE MICROSCOPIC-ADD ON: Urine-Other: NONE SEEN

## 2015-02-04 MED ORDER — METHOCARBAMOL 500 MG PO TABS: 1000.0000 mg | ORAL_TABLET | Freq: Four times a day (QID) | ORAL | Status: DC | PRN

## 2015-02-04 MED ORDER — SODIUM CHLORIDE 0.9 % IV BOLUS (SEPSIS)
1000.0000 mL | Freq: Once | INTRAVENOUS | Status: AC
  Administered 2015-02-04: 1000 mL via INTRAVENOUS

## 2015-02-04 MED ORDER — METHOCARBAMOL 500 MG PO TABS
1000.0000 mg | ORAL_TABLET | Freq: Once | ORAL | Status: AC
  Administered 2015-02-04: 1000 mg via ORAL
  Filled 2015-02-04: qty 2

## 2015-02-04 NOTE — ED Provider Notes (Signed)
CSN: [id number]     Arrival date & time 02/04/15  1712 History   First MD Initiated Contact with Patient 02/04/15 1723     Chief Complaint  Patient presents with  . Medication Refill     (Consider location/radiation/quality/duration/timing/severity/associated sxs/prior Treatment) HPI  [name] is a 39 y.o. male complaining of pain to left shoulder which is chronic states he ran out of his Percocet 10 mg tabs and would like a refill. Patient is following with orthopedics and neurosurgeon Dr. Yetta BarreJones next week. He denies any recent trauma, rates the pain is severe, 10 out of 10 and exacerbated by movement and palpation. Patient also states that his sodium is low and he would like that checked while he is here. States that while he was here he is also had some sinus congestion and would like to have this evaluated as well. Patient denies confusion, seizure, chest pain, shortness of breath, nausea vomiting, fever, chills, otalgia, cough. Unclear what the source of his hyponatremia is.  Past Medical History  Diagnosis Date  . Allergy   . Depression   . GERD (gastroesophageal reflux disease)   . Hyperlipidemia   . Chronic pain disorder     Sees [facility]  . Urinary incontinence     detrusor instability  . Hypogonadism   . Diabetes mellitus     Type II  . Chronic neck pain   . Bipolar depression    Past Surgical History  Procedure Laterality Date  . Appendectomy    . Hernia repair    . Nasal sinus surgery  2008   Family History  Problem Relation Age of Onset  . Diabetes Mother   . Hypertension Mother   . Cirrhosis Mother   . Depression Mother   . Bipolar disorder Mother   . Arthritis Father 49    osteoarthritis  . Bipolar disorder Father   . Diabetes Sister     borderline  . Heart disease Paternal Uncle [id number]    MI  . Heart disease Maternal Grandfather     late 60's--MI  . Cancer Paternal Grandmother [phone]    lung  . Bipolar disorder Paternal Grandmother    . Heart disease Paternal Grandfather [phone]    MI  . Bipolar disorder Paternal Grandfather    History  Substance Use Topics  . Smoking status: Current Every Day Smoker -- 2.00 packs/day    Types: Cigarettes    Last Attempt to Quit: 08/29/2013  . Smokeless tobacco: Never Used  . Alcohol Use: No     Comment: Drinking daily x1wk     Review of Systems  10 systems reviewed and found to be negative, except as noted in the HPI.   Allergies  Ciprofloxacin  Home Medications   Prior to Admission medications   Medication Sig Start Date End Date Taking? Authorizing Provider  albuterol (PROVENTIL HFA;VENTOLIN HFA) 108 (90 BASE) MCG/ACT inhaler Inhale 2 puffs into the lungs every 6 (six) hours as needed for wheezing or shortness of breath. 08/22/14   Sandford CrazeMelissa O'Sullivan, NP  augmented betamethasone dipropionate (DIPROLENE-AF) 0.05 % cream Apply 1 application topically 2 (two) times daily as needed. rash 08/18/14   Sandford CrazeMelissa O'Sullivan, NP  azelastine (ASTELIN) 0.1 % nasal spray Place 2 sprays into both nostrils 2 (two) times daily. Use in each nostril as directed 09/27/14   Thermon LeylandLaura A Davis, NP  azithromycin (ZITHROMAX) 250 MG tablet 2 tabs by mouth today, then one tab by mouth once daily for 4  more days 01/15/15   Sandford Craze, NP  cefUROXime (CEFTIN) 500 MG tablet Take 1 tablet (500 mg total) by mouth 2 (two) times daily. 01/15/15   Sandford Craze, NP  cetirizine (ZYRTEC) 10 MG tablet Take 1 tablet (10 mg total) by mouth daily. For allergies 03/23/14   Sanjuana Kava, NP  DULoxetine (CYMBALTA) 60 MG capsule Take 1 capsule (60 mg total) by mouth 2 (two) times daily. 12/19/14   Cleotis Nipper, MD  hydrOXYzine (ATARAX/VISTARIL) 50 MG tablet Take 1 tablet (50 mg total) by mouth daily as needed for anxiety. 01/12/15   Cleotis Nipper, MD  insulin aspart (NOVOLOG) 100 UNIT/ML FlexPen Inject TID before meals per sliding scale. 01/26/15   Sandford Craze, NP  Insulin Glargine (LANTUS SOLOSTAR) 100 UNIT/ML  Solostar Pen Inject 30 Units into the skin at bedtime. 09/27/14   Thermon Leyland, NP  lamoTRIgine (LAMICTAL) 200 MG tablet Take 1 tablet (200 mg total) by mouth daily. Take in the morning for mood control. 12/19/14   Cleotis Nipper, MD  metFORMIN (GLUCOPHAGE) 1000 MG tablet Take 1 tablet (1,000 mg total) by mouth 2 (two) times daily with a meal. 01/08/15   Sandford Craze, NP  pioglitazone (ACTOS) 30 MG tablet Take 1 tablet (30 mg total) by mouth daily. 01/08/15   Sandford Craze, NP  promethazine (PHENERGAN) 25 MG tablet Take 1 tablet (25 mg total) by mouth every 8 (eight) hours as needed for nausea or vomiting. 01/08/15   Sandford Craze, NP  sodium chloride 1 G tablet Take 1 tablet (1 g total) by mouth 2 (two) times daily with a meal. 01/30/15   Mirian Mo, MD  Testosterone 75 MG PLLT Inject 150 mg into the skin. 09/15/14   Historical Provider, MD  traZODone (DESYREL) 100 MG tablet Take 1 tablet (100 mg total) by mouth at bedtime. 12/19/14   Cleotis Nipper, MD   BP 157/97 mmHg  Pulse 118  Temp(Src) 98.2 F (36.8 C) (Oral)  Resp 16  Ht  (1.854 m)  Wt 175 lb (79.379 kg)  BMI 23.09 kg/m2  SpO2 100% Physical Exam  Constitutional: He is oriented to person, place, and time. He appears well-developed and well-nourished. No distress.  HENT:  Head: Normocephalic.  Mouth/Throat: Oropharynx is clear and moist.  No drooling or stridor. Posterior pharynx mildly erythematous no significant tonsillar hypertrophy. No exudate. Soft palate rises symmetrically. No TTP or induration under tongue.   No tenderness to palpation of frontal or bilateral maxillary sinuses.  No mucosal edema in the nares.  Bilateral tympanic membranes with normal architecture and good light reflex.    Eyes: Conjunctivae and EOM are normal. Pupils are equal, round, and reactive to light.  Neck: Normal range of motion. Neck supple.  No midline C-spine  tenderness to palpation or step-offs appreciated. Patient has full  range of motion without pain.   Spurling test is positive on the left side.  Cardiovascular: Normal rate, regular rhythm and intact distal pulses.   Pulmonary/Chest: Effort normal and breath sounds normal. No stridor. No respiratory distress. He has no wheezes. He has no rales. He exhibits no tenderness.  Abdominal: Soft. Bowel sounds are normal. He exhibits no distension and no mass. There is no tenderness. There is no rebound and no guarding.  Musculoskeletal: Normal range of motion. He exhibits no edema or tenderness.  Left shoulder:  Shoulder with no deformity. FROM to shoulder and elbow. No TTP of rotator cuff musculature. Drop arm negative. Neurovascularly  intact  Neurological: He is alert and oriented to person, place, and time.  Follows commands, Clear, goal oriented speech, Strength is 5 out of 5x4 extremities, patient ambulates with a coordinated in nonantalgic gait. Sensation is grossly intact.   Skin: Skin is warm.  Psychiatric: He has a normal mood and affect.  Nursing note and vitals reviewed.   ED Course  Procedures (including critical care time) Labs Review Labs Reviewed  CBC WITH DIFFERENTIAL/PLATELET  BASIC METABOLIC PANEL  CBG MONITORING, ED    Imaging Review No results found.   EKG Interpretation None      MDM   Final diagnoses:  None    Filed Vitals:   02/04/15 1718 02/04/15 1907  BP: 157/97 127/66  Pulse: 118 80  Temp: 98.2 F (36.8 C)   TempSrc: Oral   Resp: 16 16  Height:  (1.854 m)   Weight: 175 lb (79.379 kg)   SpO2: 100% 99%    Medications  methocarbamol (ROBAXIN) tablet 1,000 mg (1,000 mg Oral Given 02/04/15 1758)  sodium chloride 0.9 % bolus 1,000 mL (0 mLs Intravenous Stopped 02/04/15 1908)    [name] is a pleasant 39 y.o. male questing medication refill on his oxycodone 10 mg pills for his chronic neck and shoulder pain. No recent trauma. Patient would also like his sodium checked while he was in the ED that has  been running low lately, he's been compliant with his supplementation. Patient is initially tachycardic to 120, patient's tachycardia resolved after fluid bolus. Sodium is grossly normal at 133. Expenses patient that I will not refill his narcotic pain medication is that is not the role of the emergency room. He will be given Robaxin to tide him over until he can see his primary care doctor tomorrow.  Evaluation does not show pathology that would require ongoing emergent intervention or inpatient treatment. Pt is hemodynamically stable and mentating appropriately. Discussed findings and plan with patient/guardian, who agrees with care plan. All questions answered. Return precautions discussed and outpatient follow up given.   Discharge Medication List as of 02/04/2015  7:47 PM    START taking these medications   Details  methocarbamol (ROBAXIN) 500 MG tablet Take 2 tablets (1,000 mg total) by mouth 4 (four) times daily as needed (Pain)., Starting 02/04/2015, Until Discontinued, Print             Wynetta Emery, PA-C 02/04/15 2125  Purvis Sheffield, MD 02/04/15 [phone]

## 2015-02-04 NOTE — ED Notes (Addendum)
Pt reports he is followed by Orthopedics for chronic left shoulder pain, left neck pain - states he is due to begin injections for pain to left shoulder, elbow and neck, next week. Pt reports he is out of his pain medications - Percocet 10/325 q6h prn. Pt reports he also wants his sodium level checked - reports he has been taking Sodium tablets since Wednesday, 5x/day - for hyponatremia.

## 2015-02-04 NOTE — Discharge Instructions (Signed)
For pain control please take ibuprofen (also known as Motrin or Advil) 800mg  (this is normally 4 over the counter pills) 3 times a day  for 5 days. Take with food to minimize stomach irritation.  For breakthrough pain you may take Robaxin. Do not drink alcohol, drive or operate heavy machinery when taking Robaxin.  Use nasal saline (you can try [employer] Simply Saline) at least 4 times a day, use saline 5-10 minutes before using the fluticasone (flonase) nasal spray  Do not use Afrin (Oxymetazoline)  Rest, wash hands frequently  and drink plenty of water.  You may try counter medication such as Mucinex or Sudafed decongestant.  Please follow with your primary care doctor in the next 2 days for a check-up. They must obtain records for further management.   Do not hesitate to return to the Emergency Department for any new, worsening or concerning symptoms.

## 2015-02-05 ENCOUNTER — Encounter (HOSPITAL_BASED_OUTPATIENT_CLINIC_OR_DEPARTMENT_OTHER): Payer: Self-pay | Admitting: *Deleted

## 2015-02-05 ENCOUNTER — Emergency Department (HOSPITAL_COMMUNITY)
Admission: EM | Admit: 2015-02-05 | Discharge: 2015-02-05 | Disposition: A | Discharge status: Home / Self Care | Payer: Medicare Other

## 2015-02-05 ENCOUNTER — Emergency Department (HOSPITAL_BASED_OUTPATIENT_CLINIC_OR_DEPARTMENT_OTHER)
Admission: EM | Admit: 2015-02-05 | Discharge: 2015-02-05 | Discharge status: Against Medical Advice | Payer: Medicare Other | Attending: Emergency Medicine | Admitting: Emergency Medicine

## 2015-02-05 DIAGNOSIS — G894 Chronic pain syndrome: Secondary | ICD-10-CM | POA: Diagnosis not present

## 2015-02-05 DIAGNOSIS — Z720 Tobacco use: Secondary | ICD-10-CM | POA: Insufficient documentation

## 2015-02-05 DIAGNOSIS — M25512 Pain in left shoulder: Secondary | ICD-10-CM | POA: Insufficient documentation

## 2015-02-05 DIAGNOSIS — E119 Type 2 diabetes mellitus without complications: Secondary | ICD-10-CM | POA: Insufficient documentation

## 2015-02-05 NOTE — ED Notes (Signed)
No answer when called x 3.  

## 2015-02-05 NOTE — ED Notes (Addendum)
Left shoulder pain since Friday. 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 States the Robaxin we gave him yesterday is not helping his pain. He is guarding his shoulder.

## 2015-02-06 ENCOUNTER — Encounter: Payer: Self-pay | Admitting: Medical

## 2015-02-06 ENCOUNTER — Ambulatory Visit (INDEPENDENT_AMBULATORY_CARE_PROVIDER_SITE_OTHER): Payer: Medicare Other | Admitting: Endocrinology

## 2015-02-06 ENCOUNTER — Other Ambulatory Visit: Payer: Self-pay | Admitting: *Deleted

## 2015-02-06 ENCOUNTER — Telehealth: Payer: Self-pay | Admitting: Endocrinology

## 2015-02-06 ENCOUNTER — Encounter: Payer: Self-pay | Admitting: Endocrinology

## 2015-02-06 ENCOUNTER — Ambulatory Visit (INDEPENDENT_AMBULATORY_CARE_PROVIDER_SITE_OTHER): Payer: Medicare Other | Admitting: Medical

## 2015-02-06 VITALS — BP 134/78 | HR 117 | Temp 98.3°F | Resp 16 | Ht 73.0 in | Wt 180.0 lb

## 2015-02-06 VITALS — BP 138/87 | HR 93 | Temp 98.2°F | Ht 73.0 in | Wt 177.4 lb

## 2015-02-06 DIAGNOSIS — G5622 Lesion of ulnar nerve, left upper limb: Secondary | ICD-10-CM | POA: Diagnosis not present

## 2015-02-06 DIAGNOSIS — E785 Hyperlipidemia, unspecified: Secondary | ICD-10-CM

## 2015-02-06 DIAGNOSIS — M25512 Pain in left shoulder: Secondary | ICD-10-CM | POA: Diagnosis not present

## 2015-02-06 DIAGNOSIS — J0100 Acute maxillary sinusitis, unspecified: Secondary | ICD-10-CM | POA: Diagnosis not present

## 2015-02-06 DIAGNOSIS — M7542 Impingement syndrome of left shoulder: Secondary | ICD-10-CM | POA: Diagnosis not present

## 2015-02-06 DIAGNOSIS — G4489 Other headache syndrome: Secondary | ICD-10-CM | POA: Insufficient documentation

## 2015-02-06 DIAGNOSIS — R252 Cramp and spasm: Secondary | ICD-10-CM

## 2015-02-06 DIAGNOSIS — E1165 Type 2 diabetes mellitus with hyperglycemia: Secondary | ICD-10-CM

## 2015-02-06 DIAGNOSIS — M5020 Other cervical disc displacement, unspecified cervical region: Secondary | ICD-10-CM | POA: Diagnosis not present

## 2015-02-06 DIAGNOSIS — IMO0002 Reserved for concepts with insufficient information to code with codable children: Secondary | ICD-10-CM

## 2015-02-06 DIAGNOSIS — M542 Cervicalgia: Secondary | ICD-10-CM | POA: Diagnosis not present

## 2015-02-06 LAB — BASIC METABOLIC PANEL
BUN: 3 mg/dL — ABNORMAL LOW (ref 6–23)
CHLORIDE: 96 meq/L (ref 96–112)
CO2: 27 mEq/L (ref 19–32)
Calcium: 9.7 mg/dL (ref 8.4–10.5)
Creatinine, Ser: 0.74 mg/dL (ref 0.40–1.50)
GFR: 124.34 mL/min (ref >60.00)
Glucose, Bld: 334 mg/dL — ABNORMAL HIGH (ref 70–99)
Potassium: 4.8 mEq/L (ref 3.5–5.1)
Sodium: 128 mEq/L — ABNORMAL LOW (ref 135–145)

## 2015-02-06 LAB — LIPID PANEL
CHOLESTEROL: 155 mg/dL (ref 0–200)
HDL: 50.4 mg/dL (ref >39.00)
LDL Cholesterol: 88 mg/dL (ref 0–99)
NONHDL: 104.6
Total CHOL/HDL Ratio: 3
Triglycerides: 82 mg/dL (ref 0.0–149.0)
VLDL: 16.4 mg/dL (ref 0.0–40.0)

## 2015-02-06 LAB — HEMOGLOBIN A1C: Hgb A1c MFr Bld: 9.2 % — ABNORMAL HIGH (ref 4.6–6.5)

## 2015-02-06 LAB — MAGNESIUM: MAGNESIUM: 1.8 mg/dL (ref 1.5–2.5)

## 2015-02-06 MED ORDER — METOCLOPRAMIDE HCL 10 MG PO TABS: ORAL_TABLET | ORAL | Status: DC

## 2015-02-06 MED ORDER — BENZONATATE 100 MG PO CAPS: 100.0000 mg | ORAL_CAPSULE | Freq: Three times a day (TID) | ORAL | Status: DC | PRN

## 2015-02-06 MED ORDER — FLUTICASONE PROPIONATE 50 MCG/ACT NA SUSP: 2.0000 | Freq: Every day | NASAL | Status: DC

## 2015-02-06 MED ORDER — KETOROLAC TROMETHAMINE 60 MG/2ML IM SOLN
60.0000 mg | Freq: Once | INTRAMUSCULAR | Status: AC
  Administered 2015-02-06: 60 mg via INTRAMUSCULAR

## 2015-02-06 MED ORDER — SUMATRIPTAN SUCCINATE 50 MG PO TABS: 50.0000 mg | ORAL_TABLET | ORAL | Status: DC | PRN

## 2015-02-06 MED ORDER — CEPHALEXIN 500 MG PO CAPS: 500.0000 mg | ORAL_CAPSULE | Freq: Three times a day (TID) | ORAL | Status: DC

## 2015-02-06 NOTE — Assessment & Plan Note (Addendum)
Your appear to have a sinus infection(following allergic rhinitis). I am prescribing cephalexin antibiotic for the infection. To help with the nasal congestion I prescribed nasonex nasal steroid. Also can get claritin otc.  Rest, hydrate, tylenol for fever.  Follow up in 7 days or as needed.

## 2015-02-06 NOTE — Progress Notes (Signed)
[name] 38 y.o.           Reason for Appointment : Followup for Type 2 Diabetes and also evaluation of hyponatremia  History of Present Illness          Diagnosis: Type 2 diabetes mellitus, date of diagnosis: 2005     Past history: He had previously been treated with oral hypoglycemic agents but required insulin in 2009 when he had marked hyperglycemia with ketonuria. Subsequently has been on insulin, continuously since about 2010, usually basal bolus regimen. Also appeared to improve control but using Actoplusmet. However Actos was stopped by his cardiologist because of Edema. In 2011 he was taking only about 30 units of Lantus. Subsequently he required progressively higher doses of Lantus without adequate overnight glucose control. He thinks his A1c had been usually over 9% but no detailed records available However in 09/2013 his A1c was up to 10.4%  Recent history:   He has again been noncompliant with his followup and has not been seen since 10/15 He typically has had poor control of his diabetes requiring insulin No recent A1c is available again On his last visit he was told to start monitoring readings after meals and start taking mealtime coverage with NovoLog However he is taking NOVOLOG only as needed for high sugars and he is not checking his blood sugar consistently either His average blood sugar for the last month at home is 211 indicating poor control Current blood sugar patterns:  He has variability in his blood sugars at all times  Blood sugars averaging around 200 most of the time  He does not take NOVOLOG insulin when his blood sugars are normal and blood sugars will increase significantly with this especially after breakfast  His fasting blood sugars are overall relatively high but variable, probably lower and he takes extra insulin the night before including one episode of mild hypoglycemia.  Recent fasting blood sugar range is 54-380  His correction doses  of NovoLog do not always bring his blood sugar down to normal and occasionally blood sugars are persistently high  He has occasionally gone to the emergency room for high blood sugars and muscle cramps and gets better control with hydration    INSULIN regimen is described as:  Lantus 30 hs, NovoLog unknown sliding scale  Oral hypoglycemic drugs the patient is taking are: Metformin 1 g twice a day and Actos 15 mg       Glucose monitoring:  done  2-3  times a day         Glucometer:  Accu-Chek   Blood Glucose readings as discussed above Hypoglycemia: None          Self-care: The diet that the patient has been following is: Variable   Meals: 3 meals per day. breakfast is at 7 am avoiding drinks with sugar, small portions usually, may eat cereal on some mornings.  Not eating at consistent times Physical activity: variable from day to day, no formal exercise      Dietician visit: Most recent: Unknown           Wt Readings from Last 3 Encounters:  02/06/15 180 lb (81.647 kg)  02/05/15 175 lb (79.379 kg)  02/04/15 175 lb (79.379 kg)   Retinal exam: Most recent: 2014, normal  Lab Results  Component Value Date   HGBA1C 8.5* 08/17/2014   HGBA1C 10.4* 10/03/2013   HGBA1C 9.5* 06/14/2013   Lab Results  Component Value Date   MICROALBUR 1.4 11/30/2013  LDLCALC 74 02/04/2012   CREATININE 1.02 02/04/2015            Medication List       This list is accurate as of: 02/06/15  9:15 AM.  Always use your most recent med list.               albuterol 108 (90 BASE) MCG/ACT inhaler  Commonly known as:  PROVENTIL HFA;VENTOLIN HFA  Inhale 2 puffs into the lungs every 6 (six) hours as needed for wheezing or shortness of breath.     augmented betamethasone dipropionate 0.05 % cream  Commonly known as:  DIPROLENE-AF  Apply 1 application topically 2 (two) times daily as needed. rash     azelastine 0.1 % nasal spray  Commonly known as:  ASTELIN  Place 2 sprays into both nostrils 2 (two)  times daily. Use in each nostril as directed     azithromycin 250 MG tablet  Commonly known as:  ZITHROMAX  2 tabs by mouth today, then one tab by mouth once daily for 4 more days     cefUROXime 500 MG tablet  Commonly known as:  CEFTIN  Take 1 tablet (500 mg total) by mouth 2 (two) times daily.     cetirizine 10 MG tablet  Commonly known as:  ZYRTEC  Take 1 tablet (10 mg total) by mouth daily. For allergies     DULoxetine 60 MG capsule  Commonly known as:  CYMBALTA  Take 1 capsule (60 mg total) by mouth 2 (two) times daily.     hydrOXYzine 50 MG tablet  Commonly known as:  ATARAX/VISTARIL  Take 1 tablet (50 mg total) by mouth daily as needed for anxiety.     insulin aspart 100 UNIT/ML FlexPen  Commonly known as:  NOVOLOG  Inject TID before meals per sliding scale.     Insulin Glargine 100 UNIT/ML Solostar Pen  Commonly known as:  LANTUS SOLOSTAR  Inject 30 Units into the skin at bedtime.     lamoTRIgine 200 MG tablet  Commonly known as:  LAMICTAL  Take 1 tablet (200 mg total) by mouth daily. Take in the morning for mood control.     metFORMIN 1000 MG tablet  Commonly known as:  GLUCOPHAGE  Take 1 tablet (1,000 mg total) by mouth 2 (two) times daily with a meal.     methocarbamol 500 MG tablet  Commonly known as:  ROBAXIN  Take 2 tablets (1,000 mg total) by mouth 4 (four) times daily as needed (Pain).     mirabegron ER 25 MG Tb24 tablet  Commonly known as:  MYRBETRIQ  Take 25 mg by mouth daily.     oxyCODONE-acetaminophen 10-325 MG per tablet  Commonly known as:  PERCOCET  Take 1 tablet by mouth every 6 (six) hours as needed for pain.     pioglitazone 30 MG tablet  Commonly known as:  ACTOS  Take 1 tablet (30 mg total) by mouth daily.     promethazine 25 MG tablet  Commonly known as:  PHENERGAN  Take 1 tablet (25 mg total) by mouth every 8 (eight) hours as needed for nausea or vomiting.     sodium chloride 1 G tablet  Take 1 tablet (1 g total) by mouth 2  (two) times daily with a meal.     Testosterone 75 MG Pllt  Inject 150 mg into the skin.     traZODone 100 MG tablet  Commonly known as:  DESYREL  Take 1 tablet (  100 mg total) by mouth at bedtime.        Allergies:  Allergies  Allergen Reactions  . Ciprofloxacin Hives    Past Medical History  Diagnosis Date  . Allergy   . Depression   . GERD (gastroesophageal reflux disease)   . Hyperlipidemia   . Chronic pain disorder     Sees [facility]  . Urinary incontinence     detrusor instability  . Hypogonadism   . Diabetes mellitus     Type II  . Chronic neck pain   . Bipolar depression     Past Surgical History  Procedure Laterality Date  . Appendectomy    . Hernia repair    . Nasal sinus surgery  2008    Family History  Problem Relation Age of Onset  . Diabetes Mother   . Hypertension Mother   . Cirrhosis Mother   . Depression Mother   . Bipolar disorder Mother   . Arthritis Father 49    osteoarthritis  . Bipolar disorder Father   . Diabetes Sister     borderline  . Heart disease Paternal Uncle 90    MI  . Heart disease Maternal Grandfather     late 60's--MI  . Cancer Paternal Grandmother 36    lung  . Bipolar disorder Paternal Grandmother   . Heart disease Paternal Grandfather 41    MI  . Bipolar disorder Paternal Grandfather     Social History:  reports that he has been smoking Cigarettes.  He has been smoking about 2.00 packs per day. He has never used smokeless tobacco. He reports that he uses illicit drugs (Cocaine). He reports that he does not drink alcohol.    Review of Systems   HYPONATREMIA: He has had long-standing hyponatremia with variable sodium levels and his PCP has asked me to evaluate this today. His urine osmolality has been low previously and he was asked to reduce fluid intake which he thinks he is doing. He is currently not on any narcotic pain medications or SSRI drugs He does not have orthostatic  lightheadedness Does have periodic nausea which has been chronic but no diarrhea; does have some weight loss Has had sporadic levels of cortisol done which have been variable He has had occasional injections of steroids in joints and the last one was 3 weeks ago. Also may occasionally get prednisone respiratory infections He recently had a sodium of 133 which was appropriate for his high glucose level  Nausea: He has had recurrent and persistent nausea and previously given Zofran but not taking this because of insurance denial.  No significant vomiting or abdominal pain He had an ill-defined lesion on his CT scan and the pancreas but does not have any tumor on the MRI and appears to have relatively small pancreas  MUSCLE cramps: He has these periodically.  A few years ago he had taken magnesium supplements but not doing any now.  No initial levels available       Lipids: Long-standing history of hyperlipidemia, primarily high triglycerides. On treatment with 20 mg simvastatin, fish oil No recent lipid levels available.  Last LDL was high  Lab Results  Component Value Date   CHOL 215* 11/30/2013   HDL 44.80 11/30/2013   LDLCALC 74 02/04/2012   LDLDIRECT 145.6 11/30/2013   TRIG 207.0* 11/30/2013   CHOLHDL 5 11/30/2013      He has long-standing depression, he is now on Lamictal. On Cymbalta for quite some time also  Has  Numbness, tingling or burning in  feet  Was on lyrica previously but this was for fibromyalgia  Had lost weight, no history of thyroid disease, appetite variable because of nausea  Lab Results  Component Value Date   TSH 0.514 01/22/2015    HYPOGONADISM He has had hypogonadism treated by urologist. Has had Testopel and his last 2 testosterone levels were reportedly in the 500-600 range. No recent reports available   He continues to have fatigue but has had multiple other medical problems He had been treated by his urologist Dr Pete Glatter for about 3 years  for a diagnosis of hypogonadism with  Testopel Has not had any detailed evaluation for establishing etiology in October 2014 his prolactin level was high at 43 His total testosterone was minimally low at 330 and LH was 5.1  He was taking Risperdal  when he was tested for the prolactin  Physical Examination:  BP 134/78 mmHg  Pulse 117  Temp(Src) 98.3 F (36.8 C)  Resp 16  Ht 6\' 1"  (1.854 m)  Wt 180 lb (81.647 kg)  BMI 23.75 kg/m2  SpO2 99%  Standing blood pressure 124/72 No skin or mucosal pigmentation seen Thyroid not palpable   No ankle edema  ASSESSMENT/PLAN  Diabetes type 2, uncontrolled      He has had long-standing diabetes which is again not well controlled However difficult to assess his level of control since A1c not available He appears to have significant insulin deficiency in this is supported by his MRI finding of some pancreatic atrophy Discussed that he needs to be on a basal bolus insulin regimen with Lantus and NovoLog However he is reluctant to take NovoLog with every meal especially when working Discussed with him the option of using the V-go pump and explained to him how this is work.  He is very interested in this He will discuss this further with nurse educator and insurance verification will be started Will also check his A1c today Detailed instructions given on his insulin adjustment in the meantime He probably needs to stop metformin as it has not helped and may worsen his nausea   HYPERLIPIDEMIA: He has had diabetic dyslipidemia with high triglycerides in the past.  Triglycerides need to be reassessed, will do that after his glucose is better controlled  Hyponatremia:  The etiology of this is unclear. He does not appear to have SIADH from his previous urine studies Although he has had some weight loss and nausea he does not have typical features of Addison's disease Difficult to assess his cortisol status at this time because he had a steroid  injection 3 weeks ago Also random cortisol levels have not been low Will consider ACTH stimulation test on his next visit Minimally can continue fluid restrictions unless his glucose is very high More recently has sodium appears to be better with only hydration  NAUSEA: May be related to gastroparesis and will empirically start him on metoclopramide before meals  MUSCLE cramps: He will need to try magnesium empirically and we'll check his level today Like to be deficient because of his persistent hyperglycemia  Counseling time over 50% of today's 40 minute visit   Estefani Bateson 02/06/2015, 9:15 AM   Addendum: Labs show A1c 9.2; glucose 334 and sodium 128 Magnesium 1.8  He has insurance verification for the V-go pump and will schedule training for this with nurse educator

## 2015-02-06 NOTE — Progress Notes (Signed)
Pre visit review using our clinic review tool, if applicable. No additional management support is needed unless otherwise documented below in the visit note. 

## 2015-02-06 NOTE — Progress Notes (Signed)
   Subjective:    Patient ID: Timothy HayJeffrey L Munce, male    DOB: June 23, 1976, 39 y.o.   MRN: [id number]  HPI  Pt in with some sinus pressure. Some itching eyes, some sneezing and some runny nose x 1wk. No fever. Occasional blows out clear mucous.  Pt states no history of any significant ha in past. But last night. Describes band around head. Pt pain level now level 6. Last night severe ha close to 9 or 10.Marland Kitchen. He took tylenol and ibupofen then pain decreased significantly. Presently light and sound senistive. Sound is amplified. Pain level decreaed last night. Started to get ha again this am and reading to level 6 now.    Review of Systems  Constitutional: Negative for fever, chills and fatigue.  HENT: Positive for congestion, postnasal drip, sinus pressure and sneezing. Negative for sore throat and tinnitus.   Respiratory: Negative for cough, chest tightness, shortness of breath and wheezing.   Musculoskeletal: Negative for back pain.  Neurological: Positive for headaches. Negative for dizziness, seizures, syncope, facial asymmetry, speech difficulty, weakness, light-headedness and numbness.       With light and sound sensitivity. Sitting in dark room helps.  Hematological: Negative for adenopathy. Does not bruise/bleed easily.  Psychiatric/Behavioral: Negative for behavioral problems and confusion.       Objective:   Physical Exam  General  Mental Status - Alert. General Appearance - Well groomed. Not in acute distress.  Skin Rashes- No Rashes.  HEENT Head- Normal. Ear Auditory Canal - Left- Normal. Right - Normal.Tympanic Membrane- Left- Normal. Right- Normal. Eye Sclera/Conjunctiva- Left- Normal. Right- Normal. Nose & Sinuses Nasal Mucosa- Left-  Boggy and Congested. Right-  Boggy and  Congested.Rt  maxillary sinus tender but no  frontal sinus pressure. Mouth & Throat Lips: Upper Lip- Normal: no dryness, cracking, pallor, cyanosis, or vesicular eruption. Lower Lip-Normal: no  dryness, cracking, pallor, cyanosis or vesicular eruption. Buccal Mucosa- Bilateral- No Aphthous ulcers. Oropharynx- No Discharge or Erythema. + pnd Tonsils: Characteristics- Bilateral- No Erythema or Congestion. Size/Enlargement- Bilateral- No enlargement. Discharge- bilateral-None.  Neck Neck- Supple. No Masses.   Chest and Lung Exam Auscultation: Breath Sounds:-Clear even and unlabored.  Cardiovascular Auscultation:Rythm- Regular, rate and rhythm. Murmurs & Other Heart Sounds:Ausculatation of the heart reveal- No Murmurs.  Lymphatic Head & Neck General Head & Neck Lymphatics: Bilateral: Description- No Localized lymphadenopathy.   Neurologic Cranial Nerve exam:- CN III-XII intact(No nystagmus), symmetric smile. Drift Test:- No drift. Romberg Exam:- Negative.  Heal to Toe Gait exam:-Normal. Finger to Nose:- Normal/Intact Strength:- 5/5 equal and symmetric strength both upper and lower extremities.        Assessment & Plan:  Post toradol and only 10 minutes subsided 1-2 points. He looked to have less ha.

## 2015-02-06 NOTE — Telephone Encounter (Signed)
[place] VGo is covered under pharmacy benefits and the copay is $7.40. Pt is already aware.

## 2015-02-06 NOTE — Patient Instructions (Addendum)
Lantus 30 daily   Novolog 6 units for average meal and 8-10 for larger meals plus 1 unit for every 50 mg over 100  Metclopramide before each meal

## 2015-02-06 NOTE — Patient Instructions (Addendum)
Sinusitis, acute maxillary Your appear to have a sinus infection(following allergic rhinitis). I am prescribing cephalexin antibiotic for the infection. To help with the nasal congestion I prescribed nasonex nasal steroid. Also can get claritin otc.  Rest, hydrate, tylenol for fever.  Follow up in 7 days or as needed.   Headache syndrome I think presenlty he does present with migraine type ha. Will follow closely. Rx imitrex. To use if ha  comes back with light or sound sensitivity.   If ha worsens or changes then explained ED evaluation. May need imaging.   Do want pt to come back in one week since this ha is new and if not responding would either try to get imaging studies or refer to neurologist.

## 2015-02-06 NOTE — Assessment & Plan Note (Addendum)
I think presenlty he does present with migraine type ha. Will follow closely. Rx imitrex. To use if ha  comes back with light or sound sensitivity.   If ha worsens or changes then explained ED evaluation. May need imaging.   Do want pt to come back in one week since this ha is new and if not responding would either try to get imaging studies or refer to neurologist.

## 2015-02-06 NOTE — Telephone Encounter (Signed)
FYI: Please see below and advise what size to call in.

## 2015-02-06 NOTE — Telephone Encounter (Signed)
[name] needs to be called in. I will need to see him within a week after starting the pump with [name]

## 2015-02-07 ENCOUNTER — Other Ambulatory Visit: Payer: Self-pay | Admitting: Endocrinology

## 2015-02-07 ENCOUNTER — Emergency Department (HOSPITAL_BASED_OUTPATIENT_CLINIC_OR_DEPARTMENT_OTHER): Payer: Medicare Other

## 2015-02-07 ENCOUNTER — Emergency Department (HOSPITAL_BASED_OUTPATIENT_CLINIC_OR_DEPARTMENT_OTHER)
Admission: EM | Admit: 2015-02-07 | Discharge: 2015-02-07 | Disposition: A | Discharge status: Home / Self Care | Payer: Medicare Other | Attending: Emergency Medicine | Admitting: Emergency Medicine

## 2015-02-07 ENCOUNTER — Encounter (HOSPITAL_BASED_OUTPATIENT_CLINIC_OR_DEPARTMENT_OTHER): Payer: Self-pay

## 2015-02-07 DIAGNOSIS — Z720 Tobacco use: Secondary | ICD-10-CM | POA: Insufficient documentation

## 2015-02-07 DIAGNOSIS — F3130 Bipolar disorder, current episode depressed, mild or moderate severity, unspecified: Secondary | ICD-10-CM | POA: Diagnosis not present

## 2015-02-07 DIAGNOSIS — X58XXXA Exposure to other specified factors, initial encounter: Secondary | ICD-10-CM | POA: Diagnosis not present

## 2015-02-07 DIAGNOSIS — Z7951 Long term (current) use of inhaled steroids: Secondary | ICD-10-CM | POA: Diagnosis not present

## 2015-02-07 DIAGNOSIS — Z7289 Other problems related to lifestyle: Secondary | ICD-10-CM | POA: Diagnosis not present

## 2015-02-07 DIAGNOSIS — S4992XA Unspecified injury of left shoulder and upper arm, initial encounter: Secondary | ICD-10-CM | POA: Insufficient documentation

## 2015-02-07 DIAGNOSIS — Z765 Malingerer [conscious simulation]: Secondary | ICD-10-CM

## 2015-02-07 DIAGNOSIS — Z8719 Personal history of other diseases of the digestive system: Secondary | ICD-10-CM | POA: Diagnosis not present

## 2015-02-07 DIAGNOSIS — Z794 Long term (current) use of insulin: Secondary | ICD-10-CM | POA: Diagnosis not present

## 2015-02-07 DIAGNOSIS — M25512 Pain in left shoulder: Secondary | ICD-10-CM | POA: Diagnosis not present

## 2015-02-07 DIAGNOSIS — G894 Chronic pain syndrome: Secondary | ICD-10-CM | POA: Diagnosis not present

## 2015-02-07 DIAGNOSIS — Y998 Other external cause status: Secondary | ICD-10-CM | POA: Diagnosis not present

## 2015-02-07 DIAGNOSIS — Y9289 Other specified places as the place of occurrence of the external cause: Secondary | ICD-10-CM | POA: Insufficient documentation

## 2015-02-07 DIAGNOSIS — E119 Type 2 diabetes mellitus without complications: Secondary | ICD-10-CM | POA: Diagnosis not present

## 2015-02-07 DIAGNOSIS — Y9389 Activity, other specified: Secondary | ICD-10-CM | POA: Insufficient documentation

## 2015-02-07 DIAGNOSIS — Z79899 Other long term (current) drug therapy: Secondary | ICD-10-CM | POA: Diagnosis not present

## 2015-02-07 HISTORY — DX: Opioid dependence, uncomplicated: F11.20

## 2015-02-07 HISTORY — DX: Other psychoactive substance abuse, uncomplicated: F19.10

## 2015-02-07 HISTORY — DX: Malingerer (conscious simulation): Z76.5

## 2015-02-07 NOTE — ED Provider Notes (Signed)
CSN: [id number]     Arrival date & time 02/07/15  2150 History   This chart was scribed for Geoffery Lyonsouglas Jared Cahn, MD by Freida Busmaniana Omoyeni, ED Scribe. This patient was seen in room MH09/MH09 and the patient's care was started 10:42 PM.    Chief Complaint  Patient presents with  . Shoulder Injury    Patient is a 39 y.o. male presenting with shoulder injury. The history is provided by the patient and medical records. No language interpreter was used.  Shoulder Injury This is a new problem. The current episode started 3 to 5 hours ago. The problem has not changed since onset.Pertinent negatives include no chest pain, no abdominal pain, no headaches and no shortness of breath. Nothing relieves the symptoms.    HPI Comments:  Sherlyn HayJeffrey L Croswell is a 39 y.o. male with a h/o chronic pain disorder who presents to the Emergency Department complaining of moderate constant left  shoulder pain. He reports associated numbness/tingling to the fingers of his left hand. Pt states he injured his shoulder while mowing the lawn ~1800 this evening. He states he felt something rip. He denies a h/o similar injury/pain.  No alleviating factors noted.  Per medical record pt was seen in the ED for the same 3 days ago.  Past Medical History  Diagnosis Date  . Allergy   . Depression   . GERD (gastroesophageal reflux disease)   . Hyperlipidemia   . Chronic pain disorder     Sees [facility]  . Urinary incontinence     detrusor instability  . Hypogonadism   . Diabetes mellitus     Type II  . Chronic neck pain   . Bipolar depression   . Opiate dependence, continuous   . Polysubstance abuse    Past Surgical History  Procedure Laterality Date  . Appendectomy    . Hernia repair    . Nasal sinus surgery  2008   Family History  Problem Relation Age of Onset  . Diabetes Mother   . Hypertension Mother   . Cirrhosis Mother   . Depression Mother   . Bipolar disorder Mother   . Arthritis Father 49     osteoarthritis  . Bipolar disorder Father   . Diabetes Sister     borderline  . Heart disease Paternal Uncle 1843    MI  . Heart disease Maternal Grandfather     late 60's--MI  . Cancer Paternal Grandmother [id number]    lung  . Bipolar disorder Paternal Grandmother   . Heart disease Paternal Grandfather [id number]    MI  . Bipolar disorder Paternal Grandfather    History  Substance Use Topics  . Smoking status: Current Every Day Smoker -- 2.00 packs/day    Types: Cigarettes  . Smokeless tobacco: Never Used  . Alcohol Use: No    Review of Systems  Respiratory: Negative for shortness of breath.   Cardiovascular: Negative for chest pain.  Gastrointestinal: Negative for abdominal pain.  Musculoskeletal: Positive for myalgias and arthralgias.  Neurological: Negative for headaches.  All other systems reviewed and are negative.     Allergies  Ciprofloxacin  Home Medications   Prior to Admission medications   Medication Sig Start Date End Date Taking? Authorizing Provider  albuterol (PROVENTIL HFA;VENTOLIN HFA) 108 (90 BASE) MCG/ACT inhaler Inhale 2 puffs into the lungs every 6 (six) hours as needed for wheezing or shortness of breath. 08/22/14   Sandford CrazeMelissa O'Sullivan, NP  azelastine (ASTELIN) 0.1 % nasal spray Place 2  sprays into both nostrils 2 (two) times daily. Use in each nostril as directed 09/27/14   Thermon Leyland, NP  benzonatate (TESSALON) 100 MG capsule Take 1 capsule (100 mg total) by mouth 3 (three) times daily as needed. 02/06/15   Bayard Beaver Saguier, PA-C  cephALEXin (KEFLEX) 500 MG capsule Take 1 capsule (500 mg total) by mouth 3 (three) times daily. 02/06/15   Bayard Beaver Saguier, PA-C  cetirizine (ZYRTEC) 10 MG tablet Take 1 tablet (10 mg total) by mouth daily. For allergies 03/23/14   Sanjuana Kava, NP  DULoxetine (CYMBALTA) 60 MG capsule Take 1 capsule (60 mg total) by mouth 2 (two) times daily. 12/19/14   Cleotis Nipper, MD  fluticasone (FLONASE) 50 MCG/ACT nasal spray Place 2 sprays into  both nostrils daily. 02/06/15   Bayard Beaver Saguier, PA-C  hydrOXYzine (ATARAX/VISTARIL) 50 MG tablet Take 1 tablet (50 mg total) by mouth daily as needed for anxiety. 01/12/15   Cleotis Nipper, MD  insulin aspart (NOVOLOG) 100 UNIT/ML FlexPen Inject TID before meals per sliding scale. 01/26/15   Sandford Craze, NP  Insulin Glargine (LANTUS SOLOSTAR) 100 UNIT/ML Solostar Pen Inject 30 Units into the skin at bedtime. 09/27/14   Thermon Leyland, NP  lamoTRIgine (LAMICTAL) 200 MG tablet Take 1 tablet (200 mg total) by mouth daily. Take in the morning for mood control. 12/19/14   Cleotis Nipper, MD  methocarbamol (ROBAXIN) 500 MG tablet Take 2 tablets (1,000 mg total) by mouth 4 (four) times daily as needed (Pain). 02/04/15   Nicole Pisciotta, PA-C  metoCLOPramide (REGLAN) 10 MG tablet Take 1/2 to 1 tablet before each meal 02/06/15   Reather Littler, MD  mirabegron ER (MYRBETRIQ) 25 MG TB24 tablet Take 25 mg by mouth daily.    Historical Provider, MD  pioglitazone (ACTOS) 30 MG tablet Take 1 tablet (30 mg total) by mouth daily. 01/08/15   Sandford Craze, NP  sodium chloride 1 G tablet Take 1 tablet (1 g total) by mouth 2 (two) times daily with a meal. Patient not taking: Reported on 02/06/2015 01/30/15   Mirian Mo, MD  SUMAtriptan (IMITREX) 50 MG tablet Take 1 tablet (50 mg total) by mouth every 2 (two) hours as needed for migraine. May repeat in 2 hours if headache persists or recurs. 02/06/15   Bayard Beaver Saguier, PA-C  Testosterone 75 MG PLLT Inject 150 mg into the skin. 09/15/14   Historical Provider, MD  traZODone (DESYREL) 100 MG tablet Take 1 tablet (100 mg total) by mouth at bedtime. 12/19/14   Cleotis Nipper, MD   BP 152/89 mmHg  Pulse 94  Temp(Src) 98.4 F (36.9 C) (Oral)  Resp 20  Ht  (1.854 m)  Wt 175 lb (79.379 kg)  BMI 23.09 kg/m2  SpO2 100% Physical Exam  Constitutional: He appears well-developed and well-nourished. No distress.  HENT:  Head: Normocephalic and atraumatic.  Eyes:  Conjunctivae are normal.  Neck: Normal range of motion.  Cardiovascular: Normal rate.   Pulmonary/Chest: Effort normal.  Musculoskeletal: Normal range of motion.  Neurological: He is alert.  Skin: Skin is warm and dry.  Nursing note and vitals reviewed.   ED Course  Procedures   DIAGNOSTIC STUDIES:  Oxygen Saturation is 100% on RA, normal by my interpretation.    COORDINATION OF CARE:  10:47 PM Pt updated with XR results. Discussed treatment plan with pt at bedside and pt agreed to plan.  Labs Review Labs Reviewed - No data to display  Imaging Review No results found.   EKG Interpretation None      MDM   Final diagnoses:  None    This patient has a history of chronic pain and narcotic seeking behavior. He presents today with complaints of left shoulder pain he states that started at 6 this evening while unloading a lawnmower. His physical examination reveals tenderness over the shoulder, however it appears grossly normal. His x-rays are negative.  Upon reviewing his record, the patient was here 2 days ago stating that he hurt his shoulder 5 days ago while unloading a lawnmower. I confronted him with this and he denied being here and was adamant that this injury occurred just a few hours ago. He also told me that he has not had any pain medication in the past 2 months, however reviewing the [place] data base reveals this not to be true. He has filled more than 300 morphine and Percocet tablets since the beginning of February.  He will not be prescribed any further narcotics. He will be given an arm sling and advised to follow-up with his primary Dr.  I personally performed the services described in this documentation, which was scribed in my presence. The recorded information has been reviewed and is accurate.        Geoffery Lyons, MD 02/08/15 [phone]

## 2015-02-07 NOTE — ED Notes (Signed)
inj to left shoulder while lawn mower,  C/o numbness to last 21 fingers on left hand. And decreased movement to little finger,  Pos radial pulse

## 2015-02-07 NOTE — ED Notes (Signed)
C/o pain to left shoulder and numbness to fingers after lifting mower

## 2015-02-07 NOTE — Discharge Instructions (Signed)
Wear arm sling as applied this evening.  Follow-up with your primary Dr. if not improving in the next week.   Shoulder Pain The shoulder is the joint that connects your arms to your body. The bones that form the shoulder joint include the upper arm bone (humerus), the shoulder blade (scapula), and the collarbone (clavicle). The top of the humerus is shaped like a ball and fits into a rather flat socket on the scapula (glenoid cavity). A combination of muscles and strong, fibrous tissues that connect muscles to bones (tendons) support your shoulder joint and hold the ball in the socket. Small, fluid-filled sacs (bursae) are located in different areas of the joint. They act as cushions between the bones and the overlying soft tissues and help reduce friction between the gliding tendons and the bone as you move your arm. Your shoulder joint allows a wide range of motion in your arm. This range of motion allows you to do things like scratch your back or throw a ball. However, this range of motion also makes your shoulder more prone to pain from overuse and injury. Causes of shoulder pain can originate from both injury and overuse and usually can be grouped in the following four categories:  Redness, swelling, and pain (inflammation) of the tendon (tendinitis) or the bursae (bursitis).  Instability, such as a dislocation of the joint.  Inflammation of the joint (arthritis).  Broken bone (fracture). HOME CARE INSTRUCTIONS   Apply ice to the sore area.  Put ice in a plastic bag.  Place a towel between your skin and the bag.  Leave the ice on for 15-20 minutes, 3-4 times per day for the first 2 days, or as directed by your health care provider.  Stop using cold packs if they do not help with the pain.  If you have a shoulder sling or immobilizer, wear it as long as your caregiver instructs. Only remove it to shower or bathe. Move your arm as little as possible, but keep your hand moving to  prevent swelling.  Squeeze a soft ball or foam pad as much as possible to help prevent swelling.  Only take over-the-counter or prescription medicines for pain, discomfort, or fever as directed by your caregiver. SEEK MEDICAL CARE IF:   Your shoulder pain increases, or new pain develops in your arm, hand, or fingers.  Your hand or fingers become cold and numb.  Your pain is not relieved with medicines. SEEK IMMEDIATE MEDICAL CARE IF:   Your arm, hand, or fingers are numb or tingling.  Your arm, hand, or fingers are significantly swollen or turn white or blue. MAKE SURE YOU:   Understand these instructions.  Will watch your condition.  Will get help right away if you are not doing well or get worse. Document Released: 08/06/2005 Document Revised: 03/13/2014 Document Reviewed: 10/11/2011 [facility] Patient Information 2015 [employer] [place]. This information is not intended to replace advice given to you by your health care provider. Make sure you discuss any questions you have with your health care provider.

## 2015-02-07 NOTE — Progress Notes (Signed)
Quick Note:  Please let patient know that the A1c is higher at 9.2. Would still like to have him take magnesium tablets once a day for cramps even though level is not significantly low. Continue sodium tablets for low-sodium until next visit ______

## 2015-02-08 ENCOUNTER — Other Ambulatory Visit: Payer: Self-pay | Admitting: *Deleted

## 2015-02-08 MED ORDER — V-GO 20 KIT: PACK | Status: DC

## 2015-02-08 NOTE — Telephone Encounter (Signed)
rx was sent in, message left on patients vm about scheduling with you and linda.

## 2015-02-10 DIAGNOSIS — S46012A Strain of muscle(s) and tendon(s) of the rotator cuff of left shoulder, initial encounter: Secondary | ICD-10-CM | POA: Diagnosis not present

## 2015-02-12 ENCOUNTER — Emergency Department (HOSPITAL_BASED_OUTPATIENT_CLINIC_OR_DEPARTMENT_OTHER)
Admission: EM | Admit: 2015-02-12 | Discharge: 2015-02-12 | Discharge status: Against Medical Advice | Payer: Medicare Other | Attending: Emergency Medicine | Admitting: Emergency Medicine

## 2015-02-12 ENCOUNTER — Encounter (HOSPITAL_BASED_OUTPATIENT_CLINIC_OR_DEPARTMENT_OTHER): Payer: Self-pay

## 2015-02-12 ENCOUNTER — Encounter: Payer: Medicare Other | Admitting: Nutrition

## 2015-02-12 DIAGNOSIS — E119 Type 2 diabetes mellitus without complications: Secondary | ICD-10-CM | POA: Insufficient documentation

## 2015-02-12 DIAGNOSIS — Z720 Tobacco use: Secondary | ICD-10-CM | POA: Insufficient documentation

## 2015-02-12 DIAGNOSIS — G894 Chronic pain syndrome: Secondary | ICD-10-CM | POA: Diagnosis not present

## 2015-02-12 DIAGNOSIS — R197 Diarrhea, unspecified: Secondary | ICD-10-CM | POA: Insufficient documentation

## 2015-02-12 NOTE — ED Notes (Signed)
This RN noticed the patient get up out of wheelchair, walk out to his car and leave the parking lot.

## 2015-02-12 NOTE — ED Notes (Signed)
Diarrhea since early am

## 2015-02-12 NOTE — ED Notes (Signed)
Prior to triage, pt came up to lobby desk, stating "i'm about to pass out." This RN came around the desk as the patient was attempting to fall backwards. This RN caught the patient and placed him in wheelchair. Triage RN made aware

## 2015-02-14 ENCOUNTER — Other Ambulatory Visit: Payer: Self-pay | Admitting: *Deleted

## 2015-02-14 MED ORDER — INSULIN GLARGINE 100 UNIT/ML SOLOSTAR PEN: 30.0000 [IU] | PEN_INJECTOR | Freq: Every day | SUBCUTANEOUS | Status: DC

## 2015-02-15 DIAGNOSIS — S46012D Strain of muscle(s) and tendon(s) of the rotator cuff of left shoulder, subsequent encounter: Secondary | ICD-10-CM | POA: Diagnosis not present

## 2015-02-19 ENCOUNTER — Ambulatory Visit: Payer: Medicare Other | Admitting: Family

## 2015-02-21 ENCOUNTER — Encounter: Payer: Self-pay | Admitting: Family

## 2015-02-21 ENCOUNTER — Encounter: Payer: Medicare Other | Admitting: Nutrition

## 2015-02-21 ENCOUNTER — Encounter: Payer: Self-pay | Admitting: Endocrinology

## 2015-02-21 ENCOUNTER — Encounter: Payer: Medicare Other | Attending: Endocrinology | Admitting: Nutrition

## 2015-02-21 DIAGNOSIS — Z794 Long term (current) use of insulin: Secondary | ICD-10-CM | POA: Insufficient documentation

## 2015-02-21 DIAGNOSIS — M5020 Other cervical disc displacement, unspecified cervical region: Secondary | ICD-10-CM | POA: Diagnosis not present

## 2015-02-21 DIAGNOSIS — Z713 Dietary counseling and surveillance: Secondary | ICD-10-CM | POA: Diagnosis not present

## 2015-02-21 DIAGNOSIS — IMO0002 Reserved for concepts with insufficient information to code with codable children: Secondary | ICD-10-CM

## 2015-02-21 DIAGNOSIS — E1165 Type 2 diabetes mellitus with hyperglycemia: Secondary | ICD-10-CM | POA: Insufficient documentation

## 2015-02-21 DIAGNOSIS — M5412 Radiculopathy, cervical region: Secondary | ICD-10-CM | POA: Diagnosis not present

## 2015-02-21 DIAGNOSIS — M25512 Pain in left shoulder: Secondary | ICD-10-CM | POA: Diagnosis not present

## 2015-02-21 NOTE — Patient Instructions (Addendum)
Put a new v-go on every day at the same time Give 1 button press to equal 2u of insulin, and take previous doses of meal time Novolog insulin:  6u acB, 687u acL , and 8-10u acS.  Call if blood sugars are dropping low, or remain high

## 2015-02-21 NOTE — Progress Notes (Signed)
Pt. Was instructed on how to fill, apply and use the V-go 20.   He was given a starter kit of V-go 20s with directions for use.  He re demonstrated how to fill this correctly.  He applied it to his abdomen, without any difficulty, and inserted the needle.   We reviewed how to give the boluses, and he was reminded that the machine delivers 2 units with each button press.  He reported good understanding of this, and had no final questions.   He was told to take the same amount of Novolg insulin for his meals that he is doing now-remembering that he is giving 2u for every button press We reviewed how the V-go works and the need to replace this device everyday at the same time

## 2015-02-23 ENCOUNTER — Ambulatory Visit: Payer: Self-pay | Admitting: Endocrinology

## 2015-02-27 ENCOUNTER — Telehealth: Payer: Self-pay | Admitting: Nutrition

## 2015-02-27 ENCOUNTER — Other Ambulatory Visit: Payer: Self-pay | Admitting: *Deleted

## 2015-02-27 ENCOUNTER — Ambulatory Visit: Payer: Self-pay | Admitting: Endocrinology

## 2015-02-27 ENCOUNTER — Telehealth: Payer: Self-pay | Admitting: Family

## 2015-02-27 MED ORDER — PIOGLITAZONE HCL 30 MG PO TABS: 30.0000 mg | ORAL_TABLET | Freq: Every day | ORAL | Status: DC

## 2015-02-27 NOTE — Telephone Encounter (Signed)
I got him on the phone.  Pt. Reports that his boss would not let him off today.  He rescheduled with you the first available appt. Which is on 5/4.  He also says that he had a cortisone injection the afternoon he started on this V-go.Marland Kitchen. He also wants to know if he needs to continue with his Actos.  He ran out last week, and you did not refill this medication.

## 2015-02-27 NOTE — Telephone Encounter (Signed)
[facility] by Sandford CrazeMelissa O'Sullivan , effective February 21, 2015.  [facility]  Certified dismissal letter returned as undeliverable, unclaimed, return to sender after three attempts by [employer].  A label with new address were placed on the envelope by [employer]. Dismissal letter resent out by certified / registered mail using the new address. 03/13/15 DAJ

## 2015-02-27 NOTE — Telephone Encounter (Signed)
He was told to take 15 units of Lantus if blood sugars remain high, and to start back on the Actos

## 2015-02-27 NOTE — Telephone Encounter (Signed)
Message left on my machine yesterday afternoon that he is not able to see Dr. Lucianne MussKumar today, and that his blood sugars have been in the 200s and 300s despite taking extra insulin, and using all the 38u in the V-go.   I have tried to call him over the weekend and Monday--leaving messages to call me.  I left him another message to call me with blood sugar readings and to reschedule and possibly to pick up a larger size V-Go.

## 2015-02-27 NOTE — Telephone Encounter (Signed)
He needs to add at least 15 units of Lantus to  control the high sugars from his cortisone injection.  Also he can start back on Actos

## 2015-02-28 ENCOUNTER — Ambulatory Visit: Payer: Self-pay

## 2015-03-05 NOTE — Telephone Encounter (Signed)
Opened in error

## 2015-03-08 ENCOUNTER — Other Ambulatory Visit: Payer: Self-pay | Admitting: *Deleted

## 2015-03-08 ENCOUNTER — Telehealth: Payer: Self-pay | Admitting: Endocrinology

## 2015-03-08 ENCOUNTER — Emergency Department (HOSPITAL_BASED_OUTPATIENT_CLINIC_OR_DEPARTMENT_OTHER)
Admission: EM | Admit: 2015-03-08 | Discharge: 2015-03-09 | Disposition: A | Discharge status: Home / Self Care | Payer: Medicare Other | Attending: Emergency Medicine | Admitting: Emergency Medicine

## 2015-03-08 ENCOUNTER — Encounter (HOSPITAL_BASED_OUTPATIENT_CLINIC_OR_DEPARTMENT_OTHER): Payer: Self-pay | Admitting: *Deleted

## 2015-03-08 ENCOUNTER — Emergency Department (HOSPITAL_BASED_OUTPATIENT_CLINIC_OR_DEPARTMENT_OTHER): Payer: Medicare Other

## 2015-03-08 DIAGNOSIS — F319 Bipolar disorder, unspecified: Secondary | ICD-10-CM | POA: Diagnosis not present

## 2015-03-08 DIAGNOSIS — Z8719 Personal history of other diseases of the digestive system: Secondary | ICD-10-CM | POA: Diagnosis not present

## 2015-03-08 DIAGNOSIS — Z794 Long term (current) use of insulin: Secondary | ICD-10-CM | POA: Diagnosis not present

## 2015-03-08 DIAGNOSIS — R51 Headache: Secondary | ICD-10-CM | POA: Diagnosis not present

## 2015-03-08 DIAGNOSIS — Z720 Tobacco use: Secondary | ICD-10-CM | POA: Diagnosis not present

## 2015-03-08 DIAGNOSIS — G8929 Other chronic pain: Secondary | ICD-10-CM | POA: Insufficient documentation

## 2015-03-08 DIAGNOSIS — H53149 Visual discomfort, unspecified: Secondary | ICD-10-CM | POA: Diagnosis not present

## 2015-03-08 DIAGNOSIS — E871 Hypo-osmolality and hyponatremia: Secondary | ICD-10-CM | POA: Diagnosis not present

## 2015-03-08 DIAGNOSIS — Z79899 Other long term (current) drug therapy: Secondary | ICD-10-CM | POA: Diagnosis not present

## 2015-03-08 DIAGNOSIS — E119 Type 2 diabetes mellitus without complications: Secondary | ICD-10-CM | POA: Insufficient documentation

## 2015-03-08 DIAGNOSIS — Z7951 Long term (current) use of inhaled steroids: Secondary | ICD-10-CM | POA: Diagnosis not present

## 2015-03-08 DIAGNOSIS — R200 Anesthesia of skin: Secondary | ICD-10-CM | POA: Diagnosis not present

## 2015-03-08 DIAGNOSIS — R519 Headache, unspecified: Secondary | ICD-10-CM

## 2015-03-08 LAB — CBC WITH DIFFERENTIAL/PLATELET
Basophils Absolute: 0.1 10*3/uL (ref 0.0–0.1)
Basophils Relative: 0 % (ref 0–1)
EOS PCT: 1 % (ref 0–5)
Eosinophils Absolute: 0.2 10*3/uL (ref 0.0–0.7)
HCT: 35.9 % — ABNORMAL LOW (ref 39.0–52.0)
Hemoglobin: 12.3 g/dL — ABNORMAL LOW (ref 13.0–17.0)
LYMPHS ABS: 2.3 10*3/uL (ref 0.7–4.0)
LYMPHS PCT: 20 % (ref 12–46)
MCH: 30.2 pg (ref 26.0–34.0)
MCHC: 34.3 g/dL (ref 30.0–36.0)
MCV: 88.2 fL (ref 78.0–100.0)
MONO ABS: 0.9 10*3/uL (ref 0.1–1.0)
Monocytes Relative: 8 % (ref 3–12)
Neutro Abs: 7.9 10*3/uL — ABNORMAL HIGH (ref 1.7–7.7)
Neutrophils Relative %: 71 % (ref 43–77)
PLATELETS: 318 10*3/uL (ref 150–400)
RBC: 4.07 MIL/uL — AB (ref 4.22–5.81)
RDW: 13.5 % (ref 11.5–15.5)
WBC: 11.3 10*3/uL — ABNORMAL HIGH (ref 4.0–10.5)

## 2015-03-08 LAB — RAPID URINE DRUG SCREEN, HOSP PERFORMED
Amphetamines: NOT DETECTED
BARBITURATES: NOT DETECTED
Benzodiazepines: NOT DETECTED
COCAINE: NOT DETECTED
Opiates: NOT DETECTED
Tetrahydrocannabinol: NOT DETECTED

## 2015-03-08 LAB — COMPREHENSIVE METABOLIC PANEL
ALT: 24 U/L (ref 0–53)
ANION GAP: 7 (ref 5–15)
AST: 21 U/L (ref 0–37)
Albumin: 4 g/dL (ref 3.5–5.2)
Alkaline Phosphatase: 71 U/L (ref 39–117)
BILIRUBIN TOTAL: 0.4 mg/dL (ref 0.3–1.2)
BUN: 6 mg/dL (ref 6–23)
CHLORIDE: 93 mmol/L — AB (ref 96–112)
CO2: 27 mmol/L (ref 19–32)
CREATININE: 0.76 mg/dL (ref 0.50–1.35)
Calcium: 8.5 mg/dL (ref 8.4–10.5)
GLUCOSE: 144 mg/dL — AB (ref 70–99)
Potassium: 3.3 mmol/L — ABNORMAL LOW (ref 3.5–5.1)
Sodium: 127 mmol/L — ABNORMAL LOW (ref 135–145)
Total Protein: 6.4 g/dL (ref 6.0–8.3)

## 2015-03-08 LAB — URINALYSIS, ROUTINE W REFLEX MICROSCOPIC
BILIRUBIN URINE: NEGATIVE
GLUCOSE, UA: NEGATIVE mg/dL
Hgb urine dipstick: NEGATIVE
Ketones, ur: NEGATIVE mg/dL
LEUKOCYTES UA: NEGATIVE
Nitrite: NEGATIVE
Protein, ur: NEGATIVE mg/dL
Specific Gravity, Urine: 1.004 — ABNORMAL LOW (ref 1.005–1.030)
Urobilinogen, UA: 0.2 mg/dL (ref 0.0–1.0)
pH: 6.5 (ref 5.0–8.0)

## 2015-03-08 LAB — LIPASE, BLOOD: LIPASE: 29 U/L (ref 11–59)

## 2015-03-08 LAB — CBG MONITORING, ED: Glucose-Capillary: 146 mg/dL — ABNORMAL HIGH (ref 70–99)

## 2015-03-08 MED ORDER — SODIUM CHLORIDE 0.9 % IV BOLUS (SEPSIS): 500.0000 mL | Freq: Once | INTRAVENOUS | Status: DC

## 2015-03-08 MED ORDER — FENTANYL CITRATE (PF) 100 MCG/2ML IJ SOLN: 100.0000 ug | Freq: Once | INTRAMUSCULAR | Status: DC

## 2015-03-08 MED ORDER — PROCHLORPERAZINE EDISYLATE 5 MG/ML IJ SOLN
10.0000 mg | Freq: Four times a day (QID) | INTRAMUSCULAR | Status: DC | PRN
  Administered 2015-03-08: 10 mg via INTRAVENOUS
  Filled 2015-03-08: qty 2

## 2015-03-08 MED ORDER — SODIUM CHLORIDE 0.9 % IV BOLUS (SEPSIS)
1000.0000 mL | Freq: Once | INTRAVENOUS | Status: AC
  Administered 2015-03-08: 1000 mL via INTRAVENOUS

## 2015-03-08 MED ORDER — KETOROLAC TROMETHAMINE 30 MG/ML IJ SOLN
15.0000 mg | Freq: Once | INTRAMUSCULAR | Status: AC
  Administered 2015-03-08: 15 mg via INTRAVENOUS
  Filled 2015-03-08: qty 1

## 2015-03-08 MED ORDER — DIPHENHYDRAMINE HCL 50 MG/ML IJ SOLN
12.5000 mg | Freq: Once | INTRAMUSCULAR | Status: AC
  Administered 2015-03-08: 12.5 mg via INTRAVENOUS
  Filled 2015-03-08: qty 1

## 2015-03-08 NOTE — ED Notes (Signed)
Pt reports HA that started today.  Hs of same.

## 2015-03-08 NOTE — ED Notes (Signed)
Patient refused fentanyl.

## 2015-03-08 NOTE — Telephone Encounter (Signed)
Team health note dated 03/08/15 at 4:26 pm  Caller states he is out of Novalog vial. Has never had the Novalog filled at a pharmacy. Uses the Novalog for his pump. Pharmacy- [facility] [address]- [phone]. Allergic to Cipro. [1] Request for URGENT new prescription or refill of "essential" medication (i.e., likelihood of harm to patient if not taken) AND [2] triager unable to fill per unit policy  Verbal Orders/Maintenance Medications Medication Refill Route Dosage Regime Duration Admin Instructions User Name Novalog Insulin- 2 vials to be used via pump Subcutaneousvaried Dispense two vials- no refill [name], RN, Gavin Poundeborah Comments User: [name], [name], RN Date/Time (Eastern Time): 03/06/2015 6:37:09 PM Called and advised caller that prescription has been called to the pharmacy. Caller verbalized understanding. Paging DoctorName Phone DateTime Result/Outcome Message Type Notes [name], [name] 03/06/2015 6:29:11 PM Paged On Call Back to Call Center Doctor Paged [name]- [phone] [name], [name] 03/06/2015 6:33:05 PM Spoke with On Call - General Message Result Spoke with Dr. Felicity CoyerLeschber who authorized prescription.

## 2015-03-08 NOTE — Discharge Instructions (Signed)
Hyponatremia ° Hyponatremia is when the salt (sodium) in your blood is low. When salt becomes low, your cells take in extra water and puff up (swell). The puffiness can happen in the whole body. It mostly affects the brain and is very serious.  °HOME CARE °· Only take medicine as told by your doctor. °· Follow any diet instructions you were given. This includes limiting how much fluid you drink. °· Keep all doctor visits for tests as told. °· Avoid alcohol and drugs. °GET HELP RIGHT AWAY IF: °· You start to twitch and shake (seize). °· You pass out (faint). °· You continue to have watery poop (diarrhea) or you throw up (vomit). °· You feel sick to your stomach (nauseous). °· You are tired (fatigued), have a headache, are confused, or feel weak. °· Your problems that first brought you to the doctor come back. °· You have trouble following your diet instructions. °MAKE SURE YOU:  °· Understand these instructions. °· Will watch your condition. °· Will get help right away if you are not doing well or get worse. °Document Released: 07/09/2011 Document Revised: 01/19/2012 Document Reviewed: 07/09/2011 °ExitCare® Patient Information ©2015 [employer], LLC. This information is not intended to replace advice given to you by your health care provider. Make sure you discuss any questions you have with your health care provider. ° °

## 2015-03-08 NOTE — Telephone Encounter (Signed)
It looks like it was called in last night for 2 vials.

## 2015-03-08 NOTE — ED Provider Notes (Signed)
CSN: [id number]     Arrival date & time 03/08/15  2123 History  This chart was scribed for Timothy Schwartz, MD by Irene Pap, ED Scribe. This patient was seen in room MH10/MH10 and patient care was started at 10:04 PM.    Chief Complaint  Patient presents with  . Headache   Patient is a 39 y.o. male presenting with headaches. The history is provided by the patient. No language interpreter was used.  Headache Associated symptoms: dizziness, nausea, numbness and weakness   Associated symptoms: no vomiting     HPI Comments: [name] is a 39 y.o. male with a history of headaches and diabetes who presents to the Emergency Department complaining of headache onset earlier today. He states that it feels like his head is "going to explode." He reports associated dizziness, nausea, numbness and weakness. He states that he has taken medication for this problem to no relief. He states that he has seen an endocrinologist a few weeks ago, and was told that his sugar was high, so he was put on an insulin pump. He denies vomiting.   Past Medical History  Diagnosis Date  . Allergy   . Depression   . GERD (gastroesophageal reflux disease)   . Hyperlipidemia   . Chronic pain disorder     Sees [facility]  . Urinary incontinence     detrusor instability  . Hypogonadism   . Diabetes mellitus     Type II  . Chronic neck pain   . Bipolar depression   . Opiate dependence, continuous   . Polysubstance abuse   . Drug-seeking behavior    Past Surgical History  Procedure Laterality Date  . Appendectomy    . Hernia repair    . Nasal sinus surgery  2008   Family History  Problem Relation Age of Onset  . Diabetes Mother   . Hypertension Mother   . Cirrhosis Mother   . Depression Mother   . Bipolar disorder Mother   . Arthritis Father 53    osteoarthritis  . Bipolar disorder Father   . Diabetes Sister     borderline  . Heart disease Paternal Uncle 41    MI  . Heart disease  Maternal Grandfather     late 60's--MI  . Cancer Paternal Grandmother 44    lung  . Bipolar disorder Paternal Grandmother   . Heart disease Paternal Grandfather 63    MI  . Bipolar disorder Paternal Grandfather    History  Substance Use Topics  . Smoking status: Current Every Day Smoker -- 2.00 packs/day    Types: Cigarettes  . Smokeless tobacco: Never Used  . Alcohol Use: No    Review of Systems  Gastrointestinal: Positive for nausea. Negative for vomiting.  Neurological: Positive for dizziness, weakness, numbness and headaches.  All other systems reviewed and are negative.  Allergies  Ciprofloxacin  Home Medications   Prior to Admission medications   Medication Sig Start Date End Date Taking? Authorizing Provider  albuterol (PROVENTIL HFA;VENTOLIN HFA) 108 (90 BASE) MCG/ACT inhaler Inhale 2 puffs into the lungs every 6 (six) hours as needed for wheezing or shortness of breath. 08/22/14   Debbrah Alar, NP  azelastine (ASTELIN) 0.1 % nasal spray Place 2 sprays into both nostrils 2 (two) times daily. Use in each nostril as directed 09/27/14   Niel Hummer, NP  benzonatate (TESSALON) 100 MG capsule Take 1 capsule (100 mg total) by mouth 3 (three) times daily as needed.  02/06/15   [place], PA-C  cephALEXin (KEFLEX) 500 MG capsule Take 1 capsule (500 mg total) by mouth 3 (three) times daily. 02/06/15   Meriam Sprague Saguier, PA-C  cetirizine (ZYRTEC) 10 MG tablet Take 1 tablet (10 mg total) by mouth daily. For allergies 03/23/14   Encarnacion Slates, NP  DULoxetine (CYMBALTA) 60 MG capsule Take 1 capsule (60 mg total) by mouth 2 (two) times daily. 12/19/14   Kathlee Nations, MD  fluticasone (FLONASE) 50 MCG/ACT nasal spray Place 2 sprays into both nostrils daily. 02/06/15   Meriam Sprague Saguier, PA-C  hydrOXYzine (ATARAX/VISTARIL) 50 MG tablet Take 1 tablet (50 mg total) by mouth daily as needed for anxiety. 01/12/15   Kathlee Nations, MD  insulin aspart (NOVOLOG) 100 UNIT/ML FlexPen Inject  TID before meals per sliding scale. 01/26/15   Debbrah Alar, NP  Insulin Disposable Pump (V-GO 20) KIT Use one per day 02/08/15   Elayne Snare, MD  Insulin Glargine (LANTUS SOLOSTAR) 100 UNIT/ML Solostar Pen Inject 30 Units into the skin at bedtime. Patient not taking: Reported on 02/21/2015 02/14/15   Elayne Snare, MD  lamoTRIgine (LAMICTAL) 200 MG tablet Take 1 tablet (200 mg total) by mouth daily. Take in the morning for mood control. 12/19/14   Kathlee Nations, MD  methocarbamol (ROBAXIN) 500 MG tablet Take 2 tablets (1,000 mg total) by mouth 4 (four) times daily as needed (Pain). 02/04/15   Nicole Pisciotta, PA-C  metoCLOPramide (REGLAN) 10 MG tablet Take 1/2 to 1 tablet before each meal 02/06/15   Elayne Snare, MD  mirabegron ER (MYRBETRIQ) 25 MG TB24 tablet Take 25 mg by mouth daily.    Historical Provider, MD  pioglitazone (ACTOS) 30 MG tablet Take 1 tablet (30 mg total) by mouth daily. 02/27/15   Elayne Snare, MD  sodium chloride 1 G tablet Take 1 tablet (1 g total) by mouth 2 (two) times daily with a meal. Patient not taking: Reported on 02/06/2015 01/30/15   Debby Freiberg, MD  SUMAtriptan (IMITREX) 50 MG tablet Take 1 tablet (50 mg total) by mouth every 2 (two) hours as needed for migraine. May repeat in 2 hours if headache persists or recurs. 02/06/15   Meriam Sprague Saguier, PA-C  Testosterone 75 MG PLLT Inject 150 mg into the skin. 09/15/14   Historical Provider, MD  traZODone (DESYREL) 100 MG tablet Take 1 tablet (100 mg total) by mouth at bedtime. 12/19/14   Kathlee Nations, MD   BP 140/82 mmHg  Pulse 87  Temp(Src) 98.9 F (37.2 C) (Oral)  Resp 18  Ht 6' 1" (1.854 m)  Wt 175 lb (79.379 kg)  BMI 23.09 kg/m2  SpO2 100% Physical Exam Physical Exam  Nursing note and vitals reviewed. Constitutional: He is oriented to person, place, and time. He appears well-developed and well-nourished. No distress.  HENT:  Head: Normocephalic and atraumatic.  Eyes: Pupils are equal, round, and reactive to light.   Neck: Normal range of motion.  Supple no meningeal signs  Cardiovascular: Normal rate and intact distal pulses.   Pulmonary/Chest: No respiratory distress.  No wheezes rales or rhonchi  Abdominal: Normal appearance. He exhibits no distension.  Soft nontender active bowel sounds  Musculoskeletal: Normal range of motion.  Neurological: He is alert and oriented to person, place, and time. No cranial nerve deficit.  Skin: Skin is warm and dry. No rash noted.  Psychiatric: He has a normal mood and affect. His behavior is normal.   ED Course  Procedures (including critical care time) Medications  prochlorperazine (COMPAZINE) injection 10 mg (10 mg Intravenous Given 03/08/15 2318)  sodium chloride 0.9 % bolus 1,000 mL (0 mLs Intravenous Stopped 03/08/15 2356)  ketorolac (TORADOL) 30 MG/ML injection 15 mg (15 mg Intravenous Given 03/08/15 2301)  diphenhydrAMINE (BENADRYL) injection 12.5 mg (12.5 mg Intravenous Given 03/08/15 2318)    DIAGNOSTIC STUDIES: Oxygen Saturation is 100% on room air, normal by my interpretation.    COORDINATION OF CARE: 10:08 PM-Discussed treatment plan which includes labs with pt at bedside and pt agreed to plan.   Labs Review Labs Reviewed  URINALYSIS, ROUTINE W REFLEX MICROSCOPIC - Abnormal; Notable for the following:    Specific Gravity, Urine 1.004 (*)    All other components within normal limits  COMPREHENSIVE METABOLIC PANEL - Abnormal; Notable for the following:    Sodium 127 (*)    Potassium 3.3 (*)    Chloride 93 (*)    Glucose, Bld 144 (*)    All other components within normal limits  CBC WITH DIFFERENTIAL/PLATELET - Abnormal; Notable for the following:    WBC 11.3 (*)    RBC 4.07 (*)    Hemoglobin 12.3 (*)    HCT 35.9 (*)    Neutro Abs 7.9 (*)    All other components within normal limits  CBG MONITORING, ED - Abnormal; Notable for the following:    Glucose-Capillary 146 (*)    All other components within normal limits  URINE RAPID DRUG SCREEN  (HOSP PERFORMED)  LIPASE, BLOOD   Imaging Review Ct Head Wo Contrast  03/08/2015   CLINICAL DATA:  39 year old male with severe headache, nausea, vomiting, weakness and photophobia  EXAM: CT HEAD WITHOUT CONTRAST  TECHNIQUE: Contiguous axial images were obtained from the base of the skull through the vertex without intravenous contrast.  COMPARISON:  Prior head CT 01/07/2015  FINDINGS: Negative for acute intracranial hemorrhage, acute infarction, mass, mass effect, hydrocephalus or midline shift. Gray-white differentiation is preserved throughout. No acute soft tissue or calvarial abnormality. The globes and orbits are symmetric and unremarkable. Normal aeration of the mastoid air cells and visualized paranasal sinuses.  IMPRESSION: Negative head CT.   Electronically Signed   By: Jacqulynn Cadet M.D.   On: 03/08/2015 23:02      MDM   Final diagnoses:  Headache  Hyponatremia   I personally performed the services described in this documentation, which was scribed in my presence. The recorded information has been reviewed and considered.    Timothy Schwartz, MD 03/08/15 [phone]

## 2015-03-11 ENCOUNTER — Other Ambulatory Visit: Payer: Self-pay | Admitting: Family

## 2015-03-14 ENCOUNTER — Ambulatory Visit: Payer: Self-pay | Admitting: Endocrinology

## 2015-03-14 DIAGNOSIS — M5020 Other cervical disc displacement, unspecified cervical region: Secondary | ICD-10-CM | POA: Diagnosis not present

## 2015-03-14 DIAGNOSIS — M5412 Radiculopathy, cervical region: Secondary | ICD-10-CM | POA: Diagnosis not present

## 2015-03-19 ENCOUNTER — Telehealth (HOSPITAL_COMMUNITY): Payer: Self-pay | Admitting: *Deleted

## 2015-03-19 ENCOUNTER — Ambulatory Visit (HOSPITAL_COMMUNITY): Payer: Self-pay | Admitting: Psychiatry

## 2015-03-19 NOTE — Telephone Encounter (Signed)
Patient no showed appointment today.  Unable to reach patient.  Sending letter.

## 2015-03-26 ENCOUNTER — Telehealth: Payer: Self-pay | Admitting: Family

## 2015-03-26 DIAGNOSIS — J069 Acute upper respiratory infection, unspecified: Secondary | ICD-10-CM | POA: Diagnosis not present

## 2015-03-26 DIAGNOSIS — F17200 Nicotine dependence, unspecified, uncomplicated: Secondary | ICD-10-CM | POA: Diagnosis not present

## 2015-03-26 DIAGNOSIS — R062 Wheezing: Secondary | ICD-10-CM | POA: Diagnosis not present

## 2015-03-26 NOTE — Telephone Encounter (Signed)
Please advise pt that he has been dismissed from practice.  I would recommend that he be seen in urgent care for his URI.

## 2015-03-26 NOTE — Telephone Encounter (Signed)
Spoke with patient informed of below.

## 2015-03-26 NOTE — Telephone Encounter (Signed)
PATIENT NEEDS TO BE SEEN TODAY FOR URI

## 2015-03-31 ENCOUNTER — Emergency Department (HOSPITAL_BASED_OUTPATIENT_CLINIC_OR_DEPARTMENT_OTHER)
Admission: EM | Admit: 2015-03-31 | Discharge: 2015-03-31 | Disposition: A | Discharge status: Home / Self Care | Payer: Medicare Other | Attending: Emergency Medicine | Admitting: Emergency Medicine

## 2015-03-31 ENCOUNTER — Emergency Department (HOSPITAL_BASED_OUTPATIENT_CLINIC_OR_DEPARTMENT_OTHER): Payer: Medicare Other

## 2015-03-31 ENCOUNTER — Encounter (HOSPITAL_BASED_OUTPATIENT_CLINIC_OR_DEPARTMENT_OTHER): Payer: Self-pay

## 2015-03-31 DIAGNOSIS — E119 Type 2 diabetes mellitus without complications: Secondary | ICD-10-CM | POA: Insufficient documentation

## 2015-03-31 DIAGNOSIS — F17210 Nicotine dependence, cigarettes, uncomplicated: Secondary | ICD-10-CM | POA: Diagnosis not present

## 2015-03-31 DIAGNOSIS — Z8719 Personal history of other diseases of the digestive system: Secondary | ICD-10-CM | POA: Insufficient documentation

## 2015-03-31 DIAGNOSIS — Z7951 Long term (current) use of inhaled steroids: Secondary | ICD-10-CM | POA: Insufficient documentation

## 2015-03-31 DIAGNOSIS — G8929 Other chronic pain: Secondary | ICD-10-CM | POA: Insufficient documentation

## 2015-03-31 DIAGNOSIS — Z79899 Other long term (current) drug therapy: Secondary | ICD-10-CM | POA: Insufficient documentation

## 2015-03-31 DIAGNOSIS — R05 Cough: Secondary | ICD-10-CM | POA: Diagnosis present

## 2015-03-31 DIAGNOSIS — Z794 Long term (current) use of insulin: Secondary | ICD-10-CM | POA: Insufficient documentation

## 2015-03-31 DIAGNOSIS — F3130 Bipolar disorder, current episode depressed, mild or moderate severity, unspecified: Secondary | ICD-10-CM | POA: Insufficient documentation

## 2015-03-31 DIAGNOSIS — Z720 Tobacco use: Secondary | ICD-10-CM | POA: Diagnosis not present

## 2015-03-31 DIAGNOSIS — Z792 Long term (current) use of antibiotics: Secondary | ICD-10-CM | POA: Diagnosis not present

## 2015-03-31 DIAGNOSIS — R5383 Other fatigue: Secondary | ICD-10-CM | POA: Diagnosis not present

## 2015-03-31 DIAGNOSIS — J40 Bronchitis, not specified as acute or chronic: Secondary | ICD-10-CM | POA: Diagnosis not present

## 2015-03-31 DIAGNOSIS — R0789 Other chest pain: Secondary | ICD-10-CM | POA: Diagnosis not present

## 2015-03-31 DIAGNOSIS — R0989 Other specified symptoms and signs involving the circulatory and respiratory systems: Secondary | ICD-10-CM | POA: Diagnosis not present

## 2015-03-31 LAB — CBG MONITORING, ED: GLUCOSE-CAPILLARY: 164 mg/dL — AB (ref 65–99)

## 2015-03-31 MED ORDER — PROMETHAZINE-DM 6.25-15 MG/5ML PO SYRP: 2.5000 mL | ORAL_SOLUTION | Freq: Four times a day (QID) | ORAL | Status: DC | PRN

## 2015-03-31 MED ORDER — PREDNISONE 20 MG PO TABS: ORAL_TABLET | ORAL | Status: DC

## 2015-03-31 NOTE — ED Notes (Signed)
Placed on cont POX monitoring 

## 2015-03-31 NOTE — ED Notes (Signed)
Presents today w/ c/o cough, states went to Urgent Care this past Monday, rec ABX Rx but states became worse feeling last Thursday, Friday PM. Now states has HA and worse cough.

## 2015-03-31 NOTE — ED Notes (Signed)
Pt reports 2 weeks of chest congestion, hoarse.  About 1 wk ago went to pcp and was given ab, still with productive green phlegm.  Denies fevers.  Reports now with migraine.  Denies n/v/d.

## 2015-03-31 NOTE — Discharge Instructions (Signed)
Upper Respiratory Infection, Adult An upper respiratory infection (URI) is also sometimes known as the common cold. The upper respiratory tract includes the nose, sinuses, throat, trachea, and bronchi. Bronchi are the airways leading to the lungs. Most people improve within 1 week, but symptoms can last up to 2 weeks. A residual cough may last even longer.  CAUSES Many different viruses can infect the tissues lining the upper respiratory tract. The tissues become irritated and inflamed and often become very moist. Mucus production is also common. A cold is contagious. You can easily spread the virus to others by oral contact. This includes kissing, sharing a glass, coughing, or sneezing. Touching your mouth or nose and then touching a surface, which is then touched by another person, can also spread the virus. SYMPTOMS  Symptoms typically develop 1 to 3 days after you come in contact with a cold virus. Symptoms vary from person to person. They may include:  Runny nose.  Sneezing.  Nasal congestion.  Sinus irritation.  Sore throat.  Loss of voice (laryngitis).  Cough.  Fatigue.  Muscle aches.  Loss of appetite.  Headache.  Low-grade fever. DIAGNOSIS  You might diagnose your own cold based on familiar symptoms, since most people get a cold 2 to 3 times a year. Your caregiver can confirm this based on your exam. Most importantly, your caregiver can check that your symptoms are not due to another disease such as strep throat, sinusitis, pneumonia, asthma, or epiglottitis. Blood tests, throat tests, and X-rays are not necessary to diagnose a common cold, but they may sometimes be helpful in excluding other more serious diseases. Your caregiver will decide if any further tests are required. RISKS AND COMPLICATIONS  You may be at risk for a more severe case of the common cold if you smoke cigarettes, have chronic heart disease (such as heart failure) or lung disease (such as asthma), or if  you have a weakened immune system. The very young and very old are also at risk for more serious infections. Bacterial sinusitis, middle ear infections, and bacterial pneumonia can complicate the common cold. The common cold can worsen asthma and chronic obstructive pulmonary disease (COPD). Sometimes, these complications can require emergency medical care and may be life-threatening. PREVENTION  The best way to protect against getting a cold is to practice good hygiene. Avoid oral or hand contact with people with cold symptoms. Wash your hands often if contact occurs. There is no clear evidence that vitamin C, vitamin E, echinacea, or exercise reduces the chance of developing a cold. However, it is always recommended to get plenty of rest and practice good nutrition. TREATMENT  Treatment is directed at relieving symptoms. There is no cure. Antibiotics are not effective, because the infection is caused by a virus, not by bacteria. Treatment may include:  Increased fluid intake. Sports drinks offer valuable electrolytes, sugars, and fluids.  Breathing heated mist or steam (vaporizer or shower).  Eating chicken soup or other clear broths, and maintaining good nutrition.  Getting plenty of rest.  Using gargles or lozenges for comfort.  Controlling fevers with ibuprofen or acetaminophen as directed by your caregiver.  Increasing usage of your inhaler if you have asthma. Zinc gel and zinc lozenges, taken in the first 24 hours of the common cold, can shorten the duration and lessen the severity of symptoms. Pain medicines may help with fever, muscle aches, and throat pain. A variety of non-prescription medicines are available to treat congestion and runny nose. Your caregiver   can make recommendations and may suggest nasal or lung inhalers for other symptoms.  HOME CARE INSTRUCTIONS   Only take over-the-counter or prescription medicines for pain, discomfort, or fever as directed by your  caregiver.  Use a warm mist humidifier or inhale steam from a shower to increase air moisture. This may keep secretions moist and make it easier to breathe.  Drink enough water and fluids to keep your urine clear or pale yellow.  Rest as needed.  Return to work when your temperature has returned to normal or as your caregiver advises. You may need to stay home longer to avoid infecting others. You can also use a face mask and careful hand washing to prevent spread of the virus. SEEK MEDICAL CARE IF:   After the first few days, you feel you are getting worse rather than better.  You need your caregiver's advice about medicines to control symptoms.  You develop chills, worsening shortness of breath, or brown or red sputum. These may be signs of pneumonia.  You develop yellow or brown nasal discharge or pain in the face, especially when you bend forward. These may be signs of sinusitis.  You develop a fever, swollen neck glands, pain with swallowing, or white areas in the back of your throat. These may be signs of strep throat. SEEK IMMEDIATE MEDICAL CARE IF:   You have a fever.  You develop severe or persistent headache, ear pain, sinus pain, or chest pain.  You develop wheezing, a prolonged cough, cough up blood, or have a change in your usual mucus (if you have chronic lung disease).  You develop sore muscles or a stiff neck. Document Released: 04/22/2001 Document Revised: 01/19/2012 Document Reviewed: 02/01/2014 [employer] Patient Information 2015 [employer]. This information is not intended to replace advice given to you by your health care provider. Make sure you discuss any questions you have with your health care provider.  

## 2015-03-31 NOTE — ED Provider Notes (Signed)
CSN: [id number]     Arrival date & time 03/31/15  1212 History   First MD Initiated Contact with Patient 03/31/15 1304     Chief Complaint  Patient presents with  . Cough     (Consider location/radiation/quality/duration/timing/severity/associated sxs/prior Treatment) HPI Comments: Patient presents with cough. He states he's had 2 week history of cough and nasal congestion. He states he's had green discharge from his nose and he is coughing up green sputum. He has occasional shortness of breath. He also has some wheezing. He denies any chest pain. He denies any leg swelling. He denies any fevers. He denies any nausea vomiting or diarrhea. He does have an albuterol inhaler at home which he's been using with some relief in symptoms. He was seen in urgent care several days ago and was started on Augmentin which he's been using in addition the Mucinex although he does not report any improvement of symptoms.   Past Medical History  Diagnosis Date  . Allergy   . Depression   . GERD (gastroesophageal reflux disease)   . Hyperlipidemia   . Chronic pain disorder     Sees [facility]  . Urinary incontinence     detrusor instability  . Hypogonadism   . Diabetes mellitus     Type II  . Chronic neck pain   . Bipolar depression   . Opiate dependence, continuous   . Polysubstance abuse   . Drug-seeking behavior    Past Surgical History  Procedure Laterality Date  . Appendectomy    . Hernia repair    . Nasal sinus surgery  2008   Family History  Problem Relation Age of Onset  . Diabetes Mother   . Hypertension Mother   . Cirrhosis Mother   . Depression Mother   . Bipolar disorder Mother   . Arthritis Father 61    osteoarthritis  . Bipolar disorder Father   . Diabetes Sister     borderline  . Heart disease Paternal Uncle 1    MI  . Heart disease Maternal Grandfather     late 60's--MI  . Cancer Paternal Grandmother 60    lung  . Bipolar disorder Paternal  Grandmother   . Heart disease Paternal Grandfather 60    MI  . Bipolar disorder Paternal Grandfather    History  Substance Use Topics  . Smoking status: Current Every Day Smoker -- 1.50 packs/day    Types: Cigarettes  . Smokeless tobacco: Never Used  . Alcohol Use: No    Review of Systems  Constitutional: Positive for fatigue. Negative for fever, chills and diaphoresis.  HENT: Positive for congestion and rhinorrhea. Negative for sneezing.   Eyes: Negative.   Respiratory: Positive for cough, shortness of breath and wheezing. Negative for chest tightness.   Cardiovascular: Negative for chest pain and leg swelling.  Gastrointestinal: Negative for nausea, vomiting, abdominal pain, diarrhea and blood in stool.  Genitourinary: Negative for frequency, hematuria, flank pain and difficulty urinating.  Musculoskeletal: Negative for back pain and arthralgias.  Skin: Negative for rash.  Neurological: Negative for dizziness, speech difficulty, weakness, numbness and headaches.      Allergies  Ciprofloxacin  Home Medications   Prior to Admission medications   Medication Sig Start Date End Date Taking? Authorizing Provider  amoxicillin-clavulanate (AUGMENTIN) 875-125 MG per tablet Take 1 tablet by mouth 2 (two) times daily.   Yes Historical Provider, MD  albuterol (PROVENTIL HFA;VENTOLIN HFA) 108 (90 BASE) MCG/ACT inhaler Inhale 2 puffs into the lungs  every 6 (six) hours as needed for wheezing or shortness of breath. 08/22/14   Debbrah Alar, NP  azelastine (ASTELIN) 0.1 % nasal spray Place 2 sprays into both nostrils 2 (two) times daily. Use in each nostril as directed 09/27/14   Niel Hummer, NP  benzonatate (TESSALON) 100 MG capsule Take 1 capsule (100 mg total) by mouth 3 (three) times daily as needed. 02/06/15   Percell Miller Saguier, PA-C  cephALEXin (KEFLEX) 500 MG capsule Take 1 capsule (500 mg total) by mouth 3 (three) times daily. 02/06/15   Percell Miller Saguier, PA-C  cetirizine (ZYRTEC)  10 MG tablet Take 1 tablet (10 mg total) by mouth daily. For allergies 03/23/14   Encarnacion Slates, NP  DULoxetine (CYMBALTA) 60 MG capsule Take 1 capsule (60 mg total) by mouth 2 (two) times daily. 12/19/14   Kathlee Nations, MD  fluticasone (FLONASE) 50 MCG/ACT nasal spray Place 2 sprays into both nostrils daily. 02/06/15   Percell Miller Saguier, PA-C  hydrOXYzine (ATARAX/VISTARIL) 50 MG tablet Take 1 tablet (50 mg total) by mouth daily as needed for anxiety. 01/12/15   Kathlee Nations, MD  insulin aspart (NOVOLOG) 100 UNIT/ML FlexPen Inject TID before meals per sliding scale. 01/26/15   Debbrah Alar, NP  Insulin Disposable Pump (V-GO 20) KIT Use one per day 02/08/15   Elayne Snare, MD  Insulin Glargine (LANTUS SOLOSTAR) 100 UNIT/ML Solostar Pen Inject 30 Units into the skin at bedtime. Patient not taking: Reported on 02/21/2015 02/14/15   Elayne Snare, MD  lamoTRIgine (LAMICTAL) 200 MG tablet Take 1 tablet (200 mg total) by mouth daily. Take in the morning for mood control. 12/19/14   Kathlee Nations, MD  metFORMIN (GLUCOPHAGE) 1000 MG tablet TAKE 1 TABLET (1,000 MG TOTAL) BY MOUTH 2 (TWO) TIMES DAILY WITH A MEAL. 03/13/15   Debbrah Alar, NP  methocarbamol (ROBAXIN) 500 MG tablet Take 2 tablets (1,000 mg total) by mouth 4 (four) times daily as needed (Pain). 02/04/15   Nicole Pisciotta, PA-C  metoCLOPramide (REGLAN) 10 MG tablet Take 1/2 to 1 tablet before each meal 02/06/15   Elayne Snare, MD  mirabegron ER (MYRBETRIQ) 25 MG TB24 tablet Take 25 mg by mouth daily.    Historical Provider, MD  pioglitazone (ACTOS) 30 MG tablet Take 1 tablet (30 mg total) by mouth daily. 02/27/15   Elayne Snare, MD  predniSONE (DELTASONE) 20 MG tablet 3 tabs po day one, then 2 po daily x 4 days 03/31/15   Malvin Johns, MD  promethazine-dextromethorphan (PROMETHAZINE-DM) 6.25-15 MG/5ML syrup Take 2.5 mLs by mouth 4 (four) times daily as needed for cough. 03/31/15   Malvin Johns, MD  sodium chloride 1 G tablet Take 1 tablet (1 g total) by mouth 2  (two) times daily with a meal. Patient not taking: Reported on 02/06/2015 01/30/15   Debby Freiberg, MD  SUMAtriptan (IMITREX) 50 MG tablet Take 1 tablet (50 mg total) by mouth every 2 (two) hours as needed for migraine. May repeat in 2 hours if headache persists or recurs. 02/06/15   Mackie Pai, PA-C  Testosterone 75 MG PLLT Inject 150 mg into the skin. 09/15/14   Historical Provider, MD  traZODone (DESYREL) 100 MG tablet Take 1 tablet (100 mg total) by mouth at bedtime. 12/19/14   Kathlee Nations, MD   BP 133/74 mmHg  Pulse 101  Temp(Src) 97.9 F (36.6 C) (Oral)  Resp 22  Ht _0  (1.854 m)  Wt 190 lb (86.183 kg)  BMI 25.07 kg/m2  SpO2 96% Physical Exam  Constitutional: He is oriented to person, place, and time. He appears well-developed and well-nourished.  HENT:  Head: Normocephalic and atraumatic.  Right Ear: External ear normal.  Left Ear: External ear normal.  Mouth/Throat: Oropharynx is clear and moist.  Eyes: Pupils are equal, round, and reactive to light.  Neck: Normal range of motion. Neck supple.  Cardiovascular: Normal rate, regular rhythm and normal heart sounds.   Pulmonary/Chest: Effort normal and breath sounds normal. No respiratory distress. He has no wheezes. He has no rales. He exhibits no tenderness.  Abdominal: Soft. Bowel sounds are normal. There is no tenderness. There is no rebound and no guarding.  Musculoskeletal: Normal range of motion. He exhibits no edema.  Lymphadenopathy:    He has no cervical adenopathy.  Neurological: He is alert and oriented to person, place, and time.  Skin: Skin is warm and dry. No rash noted.  Psychiatric: He has a normal mood and affect.    ED Course  Procedures (including critical care time) Labs Review Labs Reviewed  CBG MONITORING, ED - Abnormal; Notable for the following:    Glucose-Capillary 164 (*)    All other components within normal limits    Imaging Review Dg Chest 2 View  03/31/2015   CLINICAL DATA:  Patient  with 2 weeks of chest congestion and tightness.  EXAM: CHEST  2 VIEW  COMPARISON:  Chest radiograph 01/22/2015  FINDINGS: Stable cardiac and mediastinal contours. No consolidative pulmonary opacities. No pleural effusion or pneumothorax. Regional skeleton is unremarkable.  IMPRESSION: No acute cardiopulmonary process.   Electronically Signed   By: Lovey Newcomer M.D.   On: 03/31/2015 12:50     EKG Interpretation None      MDM   Final diagnoses:  Bronchitis    There is no evidence of pneumonia. Patient's lungs are clear on my exam. He has normal oxygen saturations. He has no other symptoms that would be more suggestive of pulmonary embolus. He had initial tachycardia but on my exam he was not tachycardiac. He was discharged home in good condition. He was given a prescription for prednisone pack as well as advised to continue using his albuterol inhaler. He does have a history of prior opioid dependence and was given a prescription for Phenergan DM cough syrup to use for his cough. He was advised to finish his course of Augmentin.    Malvin Johns, MD 03/31/15 1344

## 2015-03-31 NOTE — ED Notes (Signed)
Patient transported to X-ray 

## 2015-04-02 NOTE — Telephone Encounter (Signed)
Certified dismissal letter returned as undeliverable, unclaimed, return to sender on Apr 02, 2015 after three attempts by [employer]. Letter placed in another envelope on Apr 03, 2015 and resent as 1st class mail which does not require a signature. DJC

## 2015-04-04 ENCOUNTER — Other Ambulatory Visit: Payer: Self-pay | Admitting: *Deleted

## 2015-04-04 MED ORDER — INSULIN LISPRO 100 UNIT/ML ~~LOC~~ SOLN: SUBCUTANEOUS | Status: DC

## 2015-04-10 DIAGNOSIS — M5412 Radiculopathy, cervical region: Secondary | ICD-10-CM | POA: Diagnosis not present

## 2015-04-10 DIAGNOSIS — M5020 Other cervical disc displacement, unspecified cervical region: Secondary | ICD-10-CM | POA: Diagnosis not present

## 2015-04-12 DIAGNOSIS — M4802 Spinal stenosis, cervical region: Secondary | ICD-10-CM | POA: Diagnosis not present

## 2015-04-12 DIAGNOSIS — M5020 Other cervical disc displacement, unspecified cervical region: Secondary | ICD-10-CM | POA: Diagnosis not present

## 2015-04-12 DIAGNOSIS — M5412 Radiculopathy, cervical region: Secondary | ICD-10-CM | POA: Diagnosis not present

## 2015-04-16 ENCOUNTER — Other Ambulatory Visit (HOSPITAL_COMMUNITY): Payer: Self-pay | Admitting: Psychiatry

## 2015-04-16 ENCOUNTER — Other Ambulatory Visit: Payer: Self-pay | Admitting: Medical

## 2015-04-17 ENCOUNTER — Other Ambulatory Visit (HOSPITAL_COMMUNITY): Payer: Self-pay | Admitting: Psychiatry

## 2015-04-18 ENCOUNTER — Telehealth (HOSPITAL_COMMUNITY): Payer: Self-pay

## 2015-04-18 DIAGNOSIS — F3131 Bipolar disorder, current episode depressed, mild: Secondary | ICD-10-CM

## 2015-04-18 NOTE — Telephone Encounter (Signed)
Telephone call with patient requesting a refill of Trazodone, Cymbalta and Lamictal.  Patient stated he rescheduled for 05/31/15.  Discussed with Dr. Lolly MustacheArfeen who reported patient would need to be seen within the next 30 days as he no showed for last appointment and has not been seen since 12/19/14.  No available appointments for Dr. Lolly MustacheArfeen prior to 05/31/15 so informed patient would discuss with Dr. Lolly MustacheArfeen again to request medication to last until 05/31/15 as patient states will be out of Lamictal after today.

## 2015-04-19 MED ORDER — DULOXETINE HCL 60 MG PO CPEP: 60.0000 mg | ORAL_CAPSULE | Freq: Two times a day (BID) | ORAL | Status: DC

## 2015-04-19 MED ORDER — TRAZODONE HCL 100 MG PO TABS: 100.0000 mg | ORAL_TABLET | Freq: Every day | ORAL | Status: DC

## 2015-04-19 MED ORDER — LAMOTRIGINE 200 MG PO TABS: 200.0000 mg | ORAL_TABLET | Freq: Every day | ORAL | Status: DC

## 2015-04-19 NOTE — Telephone Encounter (Signed)
Verified with Dr. Lolly Mustache approval to refill patient's currently prescribed Cymbalta, Lamictal and Trazodone for one month each as patient has now been rescheduled for 04/30/15.  New Cymbalta, Lamictal and Trazodone orders e-scribed to patient's [facility] in [place] per authorization by Dr. Lolly Mustache.  Called patient back to inform orders had been e-scribed for all 3 requested medication refills and reminded patient of need to keep appointment now set for 04/30/15 as patient agreed with plan.

## 2015-04-23 DIAGNOSIS — M546 Pain in thoracic spine: Secondary | ICD-10-CM | POA: Diagnosis not present

## 2015-04-23 DIAGNOSIS — M542 Cervicalgia: Secondary | ICD-10-CM | POA: Diagnosis not present

## 2015-04-23 DIAGNOSIS — M79602 Pain in left arm: Secondary | ICD-10-CM | POA: Diagnosis not present

## 2015-04-28 ENCOUNTER — Encounter (HOSPITAL_BASED_OUTPATIENT_CLINIC_OR_DEPARTMENT_OTHER): Payer: Self-pay | Admitting: *Deleted

## 2015-04-28 ENCOUNTER — Emergency Department (HOSPITAL_BASED_OUTPATIENT_CLINIC_OR_DEPARTMENT_OTHER)
Admission: EM | Admit: 2015-04-28 | Discharge: 2015-04-28 | Disposition: A | Discharge status: Home / Self Care | Payer: Medicare Other | Attending: Emergency Medicine | Admitting: Emergency Medicine

## 2015-04-28 DIAGNOSIS — Z7289 Other problems related to lifestyle: Secondary | ICD-10-CM | POA: Diagnosis not present

## 2015-04-28 DIAGNOSIS — R531 Weakness: Secondary | ICD-10-CM | POA: Diagnosis present

## 2015-04-28 DIAGNOSIS — Z794 Long term (current) use of insulin: Secondary | ICD-10-CM | POA: Insufficient documentation

## 2015-04-28 DIAGNOSIS — Z79899 Other long term (current) drug therapy: Secondary | ICD-10-CM | POA: Diagnosis not present

## 2015-04-28 DIAGNOSIS — K219 Gastro-esophageal reflux disease without esophagitis: Secondary | ICD-10-CM | POA: Diagnosis not present

## 2015-04-28 DIAGNOSIS — T675XXA Heat exhaustion, unspecified, initial encounter: Secondary | ICD-10-CM

## 2015-04-28 DIAGNOSIS — E119 Type 2 diabetes mellitus without complications: Secondary | ICD-10-CM | POA: Diagnosis not present

## 2015-04-28 DIAGNOSIS — X30XXXA Exposure to excessive natural heat, initial encounter: Secondary | ICD-10-CM | POA: Insufficient documentation

## 2015-04-28 DIAGNOSIS — Z7952 Long term (current) use of systemic steroids: Secondary | ICD-10-CM | POA: Diagnosis not present

## 2015-04-28 DIAGNOSIS — G894 Chronic pain syndrome: Secondary | ICD-10-CM | POA: Insufficient documentation

## 2015-04-28 DIAGNOSIS — Z720 Tobacco use: Secondary | ICD-10-CM | POA: Insufficient documentation

## 2015-04-28 DIAGNOSIS — Z792 Long term (current) use of antibiotics: Secondary | ICD-10-CM | POA: Insufficient documentation

## 2015-04-28 DIAGNOSIS — Y9289 Other specified places as the place of occurrence of the external cause: Secondary | ICD-10-CM | POA: Insufficient documentation

## 2015-04-28 DIAGNOSIS — R34 Anuria and oliguria: Secondary | ICD-10-CM | POA: Insufficient documentation

## 2015-04-28 DIAGNOSIS — Y998 Other external cause status: Secondary | ICD-10-CM | POA: Diagnosis not present

## 2015-04-28 DIAGNOSIS — Y9389 Activity, other specified: Secondary | ICD-10-CM | POA: Diagnosis not present

## 2015-04-28 DIAGNOSIS — E860 Dehydration: Secondary | ICD-10-CM | POA: Diagnosis not present

## 2015-04-28 DIAGNOSIS — M6281 Muscle weakness (generalized): Secondary | ICD-10-CM | POA: Diagnosis not present

## 2015-04-28 DIAGNOSIS — F3130 Bipolar disorder, current episode depressed, mild or moderate severity, unspecified: Secondary | ICD-10-CM | POA: Insufficient documentation

## 2015-04-28 LAB — URINALYSIS, ROUTINE W REFLEX MICROSCOPIC
Bilirubin Urine: NEGATIVE
Glucose, UA: NEGATIVE mg/dL
Hgb urine dipstick: NEGATIVE
Ketones, ur: NEGATIVE mg/dL
LEUKOCYTES UA: NEGATIVE
Nitrite: NEGATIVE
Protein, ur: NEGATIVE mg/dL
SPECIFIC GRAVITY, URINE: 1.006 (ref 1.005–1.030)
Urobilinogen, UA: 0.2 mg/dL (ref 0.0–1.0)
pH: 7 (ref 5.0–8.0)

## 2015-04-28 LAB — COMPREHENSIVE METABOLIC PANEL
ALBUMIN: 4.3 g/dL (ref 3.5–5.0)
ALK PHOS: 85 U/L (ref 38–126)
ALT: 19 U/L (ref 17–63)
ANION GAP: 10 (ref 5–15)
AST: 18 U/L (ref 15–41)
BUN: 8 mg/dL (ref 6–20)
CALCIUM: 9.5 mg/dL (ref 8.9–10.3)
CO2: 26 mmol/L (ref 22–32)
CREATININE: 0.85 mg/dL (ref 0.61–1.24)
Chloride: 96 mmol/L — ABNORMAL LOW (ref 101–111)
GFR calc non Af Amer: >60 mL/min (ref >60)
GLUCOSE: 216 mg/dL — AB (ref 65–99)
Potassium: 5 mmol/L (ref 3.5–5.1)
Sodium: 132 mmol/L — ABNORMAL LOW (ref 135–145)
TOTAL PROTEIN: 7.1 g/dL (ref 6.5–8.1)
Total Bilirubin: 0.6 mg/dL (ref 0.3–1.2)

## 2015-04-28 LAB — CBC WITH DIFFERENTIAL/PLATELET
Basophils Absolute: 0.1 10*3/uL (ref 0.0–0.1)
Basophils Relative: 1 % (ref 0–1)
EOS PCT: 2 % (ref 0–5)
Eosinophils Absolute: 0.2 10*3/uL (ref 0.0–0.7)
HEMATOCRIT: 42.5 % (ref 39.0–52.0)
HEMOGLOBIN: 14.2 g/dL (ref 13.0–17.0)
LYMPHS ABS: 1.7 10*3/uL (ref 0.7–4.0)
LYMPHS PCT: 18 % (ref 12–46)
MCH: 29.6 pg (ref 26.0–34.0)
MCHC: 33.4 g/dL (ref 30.0–36.0)
MCV: 88.7 fL (ref 78.0–100.0)
MONO ABS: 1 10*3/uL (ref 0.1–1.0)
MONOS PCT: 11 % (ref 3–12)
Neutro Abs: 6.1 10*3/uL (ref 1.7–7.7)
Neutrophils Relative %: 68 % (ref 43–77)
PLATELETS: 319 10*3/uL (ref 150–400)
RBC: 4.79 MIL/uL (ref 4.22–5.81)
RDW: 13.6 % (ref 11.5–15.5)
WBC: 9 10*3/uL (ref 4.0–10.5)

## 2015-04-28 LAB — CK: Total CK: 135 U/L (ref 49–397)

## 2015-04-28 MED ORDER — SODIUM CHLORIDE 0.9 % IV BOLUS (SEPSIS): 1000.0000 mL | Freq: Once | INTRAVENOUS | Status: DC

## 2015-04-28 MED ORDER — SODIUM CHLORIDE 0.9 % IV BOLUS (SEPSIS)
1000.0000 mL | Freq: Once | INTRAVENOUS | Status: AC
  Administered 2015-04-28: 1000 mL via INTRAVENOUS

## 2015-04-28 MED ORDER — SODIUM CHLORIDE 0.9 % IV SOLN
INTRAVENOUS | Status: DC
  Administered 2015-04-28: 09:00:00 via INTRAVENOUS

## 2015-04-28 NOTE — ED Notes (Signed)
States has not felt well since Thursday, with weakness and muscle cramping

## 2015-04-28 NOTE — ED Notes (Signed)
MD at bedside. To follow up with pt

## 2015-04-28 NOTE — ED Notes (Signed)
MD at bedside. To speak with pt re: lab results and plan of care

## 2015-04-28 NOTE — Discharge Instructions (Signed)
Avoid hot environment for the next couple days. Continue to hydrate yourself well with water. Return for any new or worse symptoms.

## 2015-04-28 NOTE — ED Notes (Signed)
Family at bedside. 

## 2015-04-28 NOTE — ED Notes (Signed)
States has muscle cramping in legs, noted to ambulate to br without assistance

## 2015-04-28 NOTE — ED Notes (Signed)
MD at bedside. 

## 2015-04-28 NOTE — ED Provider Notes (Signed)
CSN: [id number]     Arrival date & time 04/28/15  3716 History   First MD Initiated Contact with Patient 04/28/15 [phone]     Chief Complaint  Patient presents with  . Weakness     (Consider location/radiation/quality/duration/timing/severity/associated sxs/prior Treatment) Patient is a 39 y.o. male presenting with weakness. The history is provided by the patient.  Weakness Pertinent negatives include no chest pain, no abdominal pain, no headaches and no shortness of breath.   patient works in a hot environment as Clinical biochemist. Patient felt that the he got very exhausted from the heat on Thursday. Yesterday was off of that started develop muscle cramps. Nose the urine was dark. Patient tried to hydrate yesterday. Today feeling a little bit better but still having muscle cramps. Noted the urine was dark yesterday and today. Patient is a diabetic and uses an insulin pump.  Past Medical History  Diagnosis Date  . Allergy   . Depression   . GERD (gastroesophageal reflux disease)   . Hyperlipidemia   . Chronic pain disorder     Sees [facility]  . Urinary incontinence     detrusor instability  . Hypogonadism   . Diabetes mellitus     Type II  . Chronic neck pain   . Bipolar depression   . Opiate dependence, continuous   . Polysubstance abuse   . Drug-seeking behavior    Past Surgical History  Procedure Laterality Date  . Appendectomy    . Hernia repair    . Nasal sinus surgery  2008   Family History  Problem Relation Age of Onset  . Diabetes Mother   . Hypertension Mother   . Cirrhosis Mother   . Depression Mother   . Bipolar disorder Mother   . Arthritis Father 70    osteoarthritis  . Bipolar disorder Father   . Diabetes Sister     borderline  . Heart disease Paternal Uncle 47    MI  . Heart disease Maternal Grandfather     late 60's--MI  . Cancer Paternal Grandmother 73    lung  . Bipolar disorder Paternal Grandmother   . Heart disease Paternal  Grandfather 32    MI  . Bipolar disorder Paternal Grandfather    History  Substance Use Topics  . Smoking status: Current Every Day Smoker -- 1.50 packs/day    Types: Cigarettes  . Smokeless tobacco: Never Used  . Alcohol Use: No    Review of Systems  Constitutional: Positive for fatigue. Negative for fever.  HENT: Negative for congestion.   Eyes: Negative for visual disturbance.  Respiratory: Negative for shortness of breath.   Cardiovascular: Negative for chest pain.  Gastrointestinal: Negative for nausea, vomiting, abdominal pain and diarrhea.  Genitourinary: Positive for decreased urine volume. Negative for hematuria.  Musculoskeletal: Positive for myalgias.  Skin: Negative for rash.  Neurological: Positive for weakness. Negative for headaches.  Hematological: Does not bruise/bleed easily.  Psychiatric/Behavioral: Negative for confusion.      Allergies  Ciprofloxacin  Home Medications   Prior to Admission medications   Medication Sig Start Date End Date Taking? Authorizing Provider  albuterol (PROVENTIL HFA;VENTOLIN HFA) 108 (90 BASE) MCG/ACT inhaler Inhale 2 puffs into the lungs every 6 (six) hours as needed for wheezing or shortness of breath. 08/22/14   Debbrah Alar, NP  amoxicillin-clavulanate (AUGMENTIN) 875-125 MG per tablet Take 1 tablet by mouth 2 (two) times daily.    Historical Provider, MD  azelastine (ASTELIN) 0.1 % nasal spray Place  2 sprays into both nostrils 2 (two) times daily. Use in each nostril as directed 09/27/14   Niel Hummer, NP  benzonatate (TESSALON) 100 MG capsule Take 1 capsule (100 mg total) by mouth 3 (three) times daily as needed. 02/06/15   Percell Miller Saguier, PA-C  cephALEXin (KEFLEX) 500 MG capsule Take 1 capsule (500 mg total) by mouth 3 (three) times daily. 02/06/15   Percell Miller Saguier, PA-C  cetirizine (ZYRTEC) 10 MG tablet Take 1 tablet (10 mg total) by mouth daily. For allergies 03/23/14   Encarnacion Slates, NP  DULoxetine (CYMBALTA) 60 MG  capsule Take 1 capsule (60 mg total) by mouth 2 (two) times daily. 04/19/15   Kathlee Nations, MD  fluticasone (FLONASE) 50 MCG/ACT nasal spray PLACE 2 SPRAYS INTO BOTH NOSTRILS DAILY. 04/16/15   Percell Miller Saguier, PA-C  hydrOXYzine (ATARAX/VISTARIL) 50 MG tablet Take 1 tablet (50 mg total) by mouth daily as needed for anxiety. 01/12/15   Kathlee Nations, MD  insulin aspart (NOVOLOG) 100 UNIT/ML FlexPen Inject TID before meals per sliding scale. 01/26/15   Debbrah Alar, NP  Insulin Disposable Pump (V-GO 20) KIT Use one per day 02/08/15   Elayne Snare, MD  Insulin Glargine (LANTUS SOLOSTAR) 100 UNIT/ML Solostar Pen Inject 30 Units into the skin at bedtime. Patient not taking: Reported on 02/21/2015 02/14/15   Elayne Snare, MD  insulin lispro (HUMALOG) 100 UNIT/ML injection Use as directed in insulin pump 04/04/15   Elayne Snare, MD  lamoTRIgine (LAMICTAL) 200 MG tablet Take 1 tablet (200 mg total) by mouth daily. Take in the morning for mood control. 04/19/15   Kathlee Nations, MD  metFORMIN (GLUCOPHAGE) 1000 MG tablet TAKE 1 TABLET (1,000 MG TOTAL) BY MOUTH 2 (TWO) TIMES DAILY WITH A MEAL. 03/13/15   Debbrah Alar, NP  methocarbamol (ROBAXIN) 500 MG tablet Take 2 tablets (1,000 mg total) by mouth 4 (four) times daily as needed (Pain). 02/04/15   Nicole Pisciotta, PA-C  metoCLOPramide (REGLAN) 10 MG tablet Take 1/2 to 1 tablet before each meal 02/06/15   Elayne Snare, MD  mirabegron ER (MYRBETRIQ) 25 MG TB24 tablet Take 25 mg by mouth daily.    Historical Provider, MD  pioglitazone (ACTOS) 30 MG tablet Take 1 tablet (30 mg total) by mouth daily. 02/27/15   Elayne Snare, MD  predniSONE (DELTASONE) 20 MG tablet 3 tabs po day one, then 2 po daily x 4 days 03/31/15   Malvin Johns, MD  promethazine-dextromethorphan (PROMETHAZINE-DM) 6.25-15 MG/5ML syrup Take 2.5 mLs by mouth 4 (four) times daily as needed for cough. 03/31/15   Malvin Johns, MD  sodium chloride 1 G tablet Take 1 tablet (1 g total) by mouth 2 (two) times daily with a  meal. Patient not taking: Reported on 02/06/2015 01/30/15   Debby Freiberg, MD  SUMAtriptan (IMITREX) 50 MG tablet Take 1 tablet (50 mg total) by mouth every 2 (two) hours as needed for migraine. May repeat in 2 hours if headache persists or recurs. 02/06/15   Mackie Pai, PA-C  Testosterone 75 MG PLLT Inject 150 mg into the skin. 09/15/14   Historical Provider, MD  traZODone (DESYREL) 100 MG tablet Take 1 tablet (100 mg total) by mouth at bedtime. 04/19/15   Kathlee Nations, MD   BP 135/80 mmHg  Pulse 101  Temp(Src) 97.9 F (36.6 C) (Oral)  Resp 18  Ht 6' 1"  (1.854 m)  Wt 200 lb (90.719 kg)  BMI 26.39 kg/m2  SpO2 100% Physical Exam  Constitutional: He  is oriented to person, place, and time. He appears well-developed and well-nourished. No distress.  HENT:  Head: Normocephalic and atraumatic.  Mouth/Throat: Oropharynx is clear and moist.  Eyes: Conjunctivae and EOM are normal. Pupils are equal, round, and reactive to light.  Neck: Normal range of motion.  Cardiovascular: Normal rate, regular rhythm and normal heart sounds.   No murmur heard. Pulmonary/Chest: Effort normal and breath sounds normal. No respiratory distress.  Abdominal: Soft. Bowel sounds are normal. There is no tenderness.  Musculoskeletal: Normal range of motion. He exhibits no edema.  Neurological: He is alert and oriented to person, place, and time. No cranial nerve deficit. He exhibits normal muscle tone. Coordination normal.  Skin: Skin is warm. No rash noted.  Nursing note and vitals reviewed.   ED Course  Procedures (including critical care time) Labs Review Labs Reviewed  COMPREHENSIVE METABOLIC PANEL - Abnormal; Notable for the following:    Sodium 132 (*)    Chloride 96 (*)    Glucose, Bld 216 (*)    All other components within normal limits  URINALYSIS, ROUTINE W REFLEX MICROSCOPIC (NOT AT [facility])  CK  CBC WITH DIFFERENTIAL/PLATELET   Results for orders placed or performed during the hospital encounter  of 04/28/15  Comprehensive metabolic panel  Result Value Ref Range   Sodium 132 (L) 135 - 145 mmol/L   Potassium 5.0 3.5 - 5.1 mmol/L   Chloride 96 (L) 101 - 111 mmol/L   CO2 26 22 - 32 mmol/L   Glucose, Bld 216 (H) 65 - 99 mg/dL   BUN 8 6 - 20 mg/dL   Creatinine, Ser 0.85 0.61 - 1.24 mg/dL   Calcium 9.5 8.9 - 10.3 mg/dL   Total Protein 7.1 6.5 - 8.1 g/dL   Albumin 4.3 3.5 - 5.0 g/dL   AST 18 15 - 41 U/L   ALT 19 17 - 63 U/L   Alkaline Phosphatase 85 38 - 126 U/L   Total Bilirubin 0.6 0.3 - 1.2 mg/dL   GFR calc non Af Amer >60 >60 mL/min   GFR calc Af Amer >60 >60 mL/min   Anion gap 10 5 - 15  Urinalysis, Routine w reflex microscopic (not at [facility])  Result Value Ref Range   Color, Urine YELLOW YELLOW   APPearance CLEAR CLEAR   Specific Gravity, Urine 1.006 1.005 - 1.030   pH 7.0 5.0 - 8.0   Glucose, UA NEGATIVE NEGATIVE mg/dL   Hgb urine dipstick NEGATIVE NEGATIVE   Bilirubin Urine NEGATIVE NEGATIVE   Ketones, ur NEGATIVE NEGATIVE mg/dL   Protein, ur NEGATIVE NEGATIVE mg/dL   Urobilinogen, UA 0.2 0.0 - 1.0 mg/dL   Nitrite NEGATIVE NEGATIVE   Leukocytes, UA NEGATIVE NEGATIVE  CK  Result Value Ref Range   Total CK 135 49 - 397 U/L  CBC with Differential/Platelet  Result Value Ref Range   WBC 9.0 4.0 - 10.5 K/uL   RBC 4.79 4.22 - 5.81 MIL/uL   Hemoglobin 14.2 13.0 - 17.0 g/dL   HCT 42.5 39.0 - 52.0 %   MCV 88.7 78.0 - 100.0 fL   MCH 29.6 26.0 - 34.0 pg   MCHC 33.4 30.0 - 36.0 g/dL   RDW 13.6 11.5 - 15.5 %   Platelets 319 150 - 400 K/uL   Neutrophils Relative % 68 43 - 77 %   Neutro Abs 6.1 1.7 - 7.7 K/uL   Lymphocytes Relative 18 12 - 46 %   Lymphs Abs 1.7 0.7 - 4.0 K/uL  Monocytes Relative 11 3 - 12 %   Monocytes Absolute 1.0 0.1 - 1.0 K/uL   Eosinophils Relative 2 0 - 5 %   Eosinophils Absolute 0.2 0.0 - 0.7 K/uL   Basophils Relative 1 0 - 1 %   Basophils Absolute 0.1 0.0 - 0.1 K/uL     Imaging Review No results found.   EKG Interpretation None       MDM   Final diagnoses:  Dehydration  Heat exhaustion, initial encounter    Patient works in a hot environment. The patient on Thursday sounds like he got dehydrated. Start with muscle cramps yesterday urine was dark. Her today no evidence of rhabdo mild lysis. Renal function is normal. Patient received 2 L of normal saline and feels much better. Calcium potassium and CPK were all normal.  Patient is a known diabetic. Normally uses an insulin pump did not have it on today. Blood sugar was a little high. However no spillage of sugar into the urine. No evidence of DKA.    Fredia Sorrow, MD 04/28/15 [phone]

## 2015-04-30 ENCOUNTER — Ambulatory Visit (HOSPITAL_COMMUNITY): Payer: Self-pay | Admitting: Psychiatry

## 2015-05-04 ENCOUNTER — Other Ambulatory Visit (HOSPITAL_COMMUNITY): Payer: Self-pay | Admitting: Psychiatry

## 2015-05-07 ENCOUNTER — Encounter (HOSPITAL_COMMUNITY): Payer: Self-pay | Admitting: Psychiatry

## 2015-05-07 ENCOUNTER — Other Ambulatory Visit: Payer: Self-pay

## 2015-05-07 ENCOUNTER — Ambulatory Visit (INDEPENDENT_AMBULATORY_CARE_PROVIDER_SITE_OTHER): Payer: 59 | Admitting: Psychiatry

## 2015-05-07 VITALS — BP 123/83 | HR 93 | Ht 73.0 in | Wt 198.2 lb

## 2015-05-07 DIAGNOSIS — F319 Bipolar disorder, unspecified: Secondary | ICD-10-CM

## 2015-05-07 DIAGNOSIS — M4802 Spinal stenosis, cervical region: Secondary | ICD-10-CM | POA: Diagnosis not present

## 2015-05-07 DIAGNOSIS — M5020 Other cervical disc displacement, unspecified cervical region: Secondary | ICD-10-CM | POA: Diagnosis not present

## 2015-05-07 DIAGNOSIS — M5412 Radiculopathy, cervical region: Secondary | ICD-10-CM | POA: Diagnosis not present

## 2015-05-07 DIAGNOSIS — F3131 Bipolar disorder, current episode depressed, mild: Secondary | ICD-10-CM

## 2015-05-07 DIAGNOSIS — M25512 Pain in left shoulder: Secondary | ICD-10-CM | POA: Diagnosis not present

## 2015-05-07 DIAGNOSIS — M542 Cervicalgia: Secondary | ICD-10-CM | POA: Diagnosis not present

## 2015-05-07 MED ORDER — TRAZODONE HCL 100 MG PO TABS: 100.0000 mg | ORAL_TABLET | Freq: Every day | ORAL | Status: DC

## 2015-05-07 MED ORDER — GABAPENTIN 100 MG PO CAPS: 100.0000 mg | ORAL_CAPSULE | Freq: Every day | ORAL | Status: DC

## 2015-05-07 MED ORDER — LAMOTRIGINE 200 MG PO TABS: 200.0000 mg | ORAL_TABLET | Freq: Every day | ORAL | Status: DC

## 2015-05-07 MED ORDER — DULOXETINE HCL 60 MG PO CPEP: 60.0000 mg | ORAL_CAPSULE | Freq: Two times a day (BID) | ORAL | Status: DC

## 2015-05-07 NOTE — Progress Notes (Signed)
[facility] [phone] Progress Note  [name] [id number] 39 y.o.  05/07/2015 2:29 PM  Chief Complaint:  I'm taking my medication.  I still have a lot of back pain.  But my depression is under control.        History of Present Illness:  [name] came for his followup appointment .  He missed last appointment but he has been taking his medication as prescribed.  Recently he seen in the emergency room because of severe dehydration.  Patient told he was working outside in 2 severe hot temperature that causes dehydration.  He was given hydration.  He is feeling better.  He is happy that he is gaining weight.  Now he is taking insulin pump which is helping his blood sugar.  He has chronic back pain and neck pain.  He has MRI and now he is seeing Dr. Assunta Curtis for the pain management.  He was given tramadol however he felt more depressed, dizzy, sweating and nightmares.  He stopped taking tramadol.  He is also taking and about it for his dental pain.  He is given epidural shot for dental pain and chronic back pain.  He is not drinking or using any illegal substances.  He reported his relationship is going very well.  He denies any agitation, anger, severe mood swing.  He has not seen therapist because he has been busy at work.  Patient denies any rash or itching with the Lamictal.  He reported his mood has been stable.  He denies any nightmares or any flashback.  He denies any panic attack.  He is not using any narcotic pain medication from the streets.  His appetite is okay.  His vitals are stable.  He is very happy because he got engaged recently and his girlfriend also a patient in this office.  Patient has no tremors, shakes or any side effects.  Suicidal Ideation: No Plan Formed: No Patient has means to carry out plan: No  Homicidal Ideation: No Plan Formed: No Patient has means to carry out plan: No  Past Psychiatric History/Hospitalization(s) Patient has multiple psychiatric hospital  admission in past. His last psychiatric hospitalization was November 2015 at [facility].  He was depressed and having suicidal thoughts.  He was drinking alcohol and using cocaine.  Earlier than then he was admitted from Mar 20, 2014 to Mar 24, 2014.  He was not taking his Lamictal and relapsed into drinking.  His blood alcohol level was 149 and his UDS is positive for cocaine.  In the past he has hospitalization at [facility], [place].  He has history of suicidal thinking and overdose on Klonopin.  He endorsed history of paranoia, anger , substance use , mood swings anger and irritability.  He had tried Depakote, BuSpar, Risperdal, Prozac in recent months but stopped because of the side effects.  He endorsed increased prolactin level which also Risperdal .  He has history of using Xanax, Valium and Klonopin.   Anxiety: Yes Bipolar Disorder: Yes Depression: Yes Mania: Yes Psychosis: Yes Schizophrenia: No Personality Disorder: No Hospitalization for psychiatric illness: Yes History of Electroconvulsive Shock Therapy: No Prior Suicide Attempts: Yes  Medical History; Patient has multiple medical problems.  He has hypertension, diabetes mellitus , asthma, GERD, chronic pain, degenerative disc disease, fibromyalgia, low testosterone, hyperlipidemia and allergic rhinitis.  His primary care physician is Dr. Conley Canal and he see Dr. Dwyane Dee for her diabetes.  Review of Systems  Eyes: Negative  for blurred vision.  Respiratory: Negative.   Cardiovascular: Negative.   Musculoskeletal: Positive for back pain, joint pain and neck pain.       Dental pain  Skin: Negative for itching and rash.  Neurological: Negative for dizziness, tingling and tremors.  Psychiatric/Behavioral: Negative for suicidal ideas, hallucinations and substance abuse.     Psychiatric: Agitation: No Hallucination: No Depressed Mood: No Insomnia: No Hypersomnia: No Altered  Concentration: No Feels Worthless: No Grandiose Ideas: No Belief In Special Powers: No New/Increased Substance Abuse: No Compulsions: No  Neurologic: Headache: No Seizure: No Paresthesias: No    Outpatient Encounter Prescriptions as of 05/07/2015  Medication Sig  . albuterol (PROVENTIL HFA;VENTOLIN HFA) 108 (90 BASE) MCG/ACT inhaler Inhale 2 puffs into the lungs every 6 (six) hours as needed for wheezing or shortness of breath.  Marland Kitchen amoxicillin-clavulanate (AUGMENTIN) 875-125 MG per tablet Take 1 tablet by mouth 2 (two) times daily.  Marland Kitchen azelastine (ASTELIN) 0.1 % nasal spray Place 2 sprays into both nostrils 2 (two) times daily. Use in each nostril as directed  . cetirizine (ZYRTEC) 10 MG tablet Take 1 tablet (10 mg total) by mouth daily. For allergies  . DULoxetine (CYMBALTA) 60 MG capsule Take 1 capsule (60 mg total) by mouth 2 (two) times daily.  . fluticasone (FLONASE) 50 MCG/ACT nasal spray PLACE 2 SPRAYS INTO BOTH NOSTRILS DAILY.  Marland Kitchen gabapentin (NEURONTIN) 100 MG capsule Take 1 capsule (100 mg total) by mouth daily.  . insulin aspart (NOVOLOG) 100 UNIT/ML FlexPen Inject TID before meals per sliding scale.  . Insulin Disposable Pump (V-GO 20) KIT Use one per day  . Insulin Glargine (LANTUS SOLOSTAR) 100 UNIT/ML Solostar Pen Inject 30 Units into the skin at bedtime. (Patient not taking: Reported on 02/21/2015)  . insulin lispro (HUMALOG) 100 UNIT/ML injection Use as directed in insulin pump  . lamoTRIgine (LAMICTAL) 200 MG tablet Take 1 tablet (200 mg total) by mouth daily. Take in the morning for mood control.  . mirabegron ER (MYRBETRIQ) 25 MG TB24 tablet Take 25 mg by mouth daily.  . pioglitazone (ACTOS) 30 MG tablet Take 1 tablet (30 mg total) by mouth daily.  . sodium chloride 1 G tablet Take 1 tablet (1 g total) by mouth 2 (two) times daily with a meal. (Patient not taking: Reported on 02/06/2015)  . Testosterone 75 MG PLLT Inject 150 mg into the skin.  Marland Kitchen traZODone (DESYREL) 100 MG  tablet Take 1 tablet (100 mg total) by mouth at bedtime.  . [DISCONTINUED] benzonatate (TESSALON) 100 MG capsule Take 1 capsule (100 mg total) by mouth 3 (three) times daily as needed. (Patient not taking: Reported on 05/07/2015)  . [DISCONTINUED] cephALEXin (KEFLEX) 500 MG capsule Take 1 capsule (500 mg total) by mouth 3 (three) times daily.  . [DISCONTINUED] DULoxetine (CYMBALTA) 60 MG capsule Take 1 capsule (60 mg total) by mouth 2 (two) times daily.  . [DISCONTINUED] hydrOXYzine (ATARAX/VISTARIL) 50 MG tablet Take 1 tablet (50 mg total) by mouth daily as needed for anxiety.  . [DISCONTINUED] lamoTRIgine (LAMICTAL) 200 MG tablet Take 1 tablet (200 mg total) by mouth daily. Take in the morning for mood control.  . [DISCONTINUED] metFORMIN (GLUCOPHAGE) 1000 MG tablet TAKE 1 TABLET (1,000 MG TOTAL) BY MOUTH 2 (TWO) TIMES DAILY WITH A MEAL.  . [DISCONTINUED] methocarbamol (ROBAXIN) 500 MG tablet Take 2 tablets (1,000 mg total) by mouth 4 (four) times daily as needed (Pain).  . [DISCONTINUED] metoCLOPramide (REGLAN) 10 MG tablet Take 1/2 to 1 tablet before  each meal  . [DISCONTINUED] predniSONE (DELTASONE) 20 MG tablet 3 tabs po day one, then 2 po daily x 4 days  . [DISCONTINUED] promethazine-dextromethorphan (PROMETHAZINE-DM) 6.25-15 MG/5ML syrup Take 2.5 mLs by mouth 4 (four) times daily as needed for cough.  . [DISCONTINUED] SUMAtriptan (IMITREX) 50 MG tablet Take 1 tablet (50 mg total) by mouth every 2 (two) hours as needed for migraine. May repeat in 2 hours if headache persists or recurs. (Patient not taking: Reported on 05/07/2015)  . [DISCONTINUED] traZODone (DESYREL) 100 MG tablet Take 1 tablet (100 mg total) by mouth at bedtime.   No facility-administered encounter medications on file as of 05/07/2015.    Recent Results (from the past 2160 hour(s))  CBG monitoring, ED     Status: Abnormal   Collection Time: 03/08/15  9:31 PM  Result Value Ref Range   Glucose-Capillary 146 (H) 70 - 99 mg/dL   Urinalysis, Routine w reflex microscopic     Status: Abnormal   Collection Time: 03/08/15 10:23 PM  Result Value Ref Range   Color, Urine YELLOW YELLOW   APPearance CLEAR CLEAR   Specific Gravity, Urine 1.004 (L) 1.005 - 1.030   pH 6.5 5.0 - 8.0   Glucose, UA NEGATIVE NEGATIVE mg/dL   Hgb urine dipstick NEGATIVE NEGATIVE   Bilirubin Urine NEGATIVE NEGATIVE   Ketones, ur NEGATIVE NEGATIVE mg/dL   Protein, ur NEGATIVE NEGATIVE mg/dL   Urobilinogen, UA 0.2 0.0 - 1.0 mg/dL   Nitrite NEGATIVE NEGATIVE   Leukocytes, UA NEGATIVE NEGATIVE    Comment: MICROSCOPIC NOT DONE ON URINES WITH NEGATIVE PROTEIN, BLOOD, LEUKOCYTES, NITRITE, OR GLUCOSE <1000 mg/dL.  Urine rapid drug screen (hosp performed)     Status: None   Collection Time: 03/08/15 10:23 PM  Result Value Ref Range   Opiates NONE DETECTED NONE DETECTED   Cocaine NONE DETECTED NONE DETECTED   Benzodiazepines NONE DETECTED NONE DETECTED   Amphetamines NONE DETECTED NONE DETECTED   Tetrahydrocannabinol NONE DETECTED NONE DETECTED   Barbiturates NONE DETECTED NONE DETECTED    Comment:        DRUG SCREEN FOR MEDICAL PURPOSES ONLY.  IF CONFIRMATION IS NEEDED FOR ANY PURPOSE, NOTIFY LAB WITHIN 5 DAYS.        LOWEST DETECTABLE LIMITS FOR URINE DRUG SCREEN Drug Class       Cutoff (ng/mL) Amphetamine      1000 Barbiturate      200 Benzodiazepine   643 Tricyclics       329 Opiates          300 Cocaine          300 THC              50   Comprehensive metabolic panel     Status: Abnormal   Collection Time: 03/08/15 10:23 PM  Result Value Ref Range   Sodium 127 (L) 135 - 145 mmol/L   Potassium 3.3 (L) 3.5 - 5.1 mmol/L   Chloride 93 (L) 96 - 112 mmol/L   CO2 27 19 - 32 mmol/L   Glucose, Bld 144 (H) 70 - 99 mg/dL   BUN 6 6 - 23 mg/dL   Creatinine, Ser 0.76 0.50 - 1.35 mg/dL   Calcium 8.5 8.4 - 10.5 mg/dL   Total Protein 6.4 6.0 - 8.3 g/dL   Albumin 4.0 3.5 - 5.2 g/dL   AST 21 0 - 37 U/L   ALT 24 0 - 53 U/L   Alkaline  Phosphatase 71 39 - 117 U/L  Total Bilirubin 0.4 0.3 - 1.2 mg/dL   GFR calc non Af Amer >90 >90 mL/min   GFR calc Af Amer >90 >90 mL/min    Comment: (NOTE) The eGFR has been calculated using the CKD EPI equation. This calculation has not been validated in all clinical situations. eGFR's persistently <90 mL/min signify possible Chronic Kidney Disease.    Anion gap 7 5 - 15  Lipase, blood     Status: None   Collection Time: 03/08/15 10:23 PM  Result Value Ref Range   Lipase 29 11 - 59 U/L  CBC with Differential/Platelet     Status: Abnormal   Collection Time: 03/08/15 10:23 PM  Result Value Ref Range   WBC 11.3 (H) 4.0 - 10.5 K/uL   RBC 4.07 (L) 4.22 - 5.81 MIL/uL   Hemoglobin 12.3 (L) 13.0 - 17.0 g/dL   HCT 35.9 (L) 39.0 - 52.0 %   MCV 88.2 78.0 - 100.0 fL   MCH 30.2 26.0 - 34.0 pg   MCHC 34.3 30.0 - 36.0 g/dL   RDW 13.5 11.5 - 15.5 %   Platelets 318 150 - 400 K/uL   Neutrophils Relative % 71 43 - 77 %   Neutro Abs 7.9 (H) 1.7 - 7.7 K/uL   Lymphocytes Relative 20 12 - 46 %   Lymphs Abs 2.3 0.7 - 4.0 K/uL   Monocytes Relative 8 3 - 12 %   Monocytes Absolute 0.9 0.1 - 1.0 K/uL   Eosinophils Relative 1 0 - 5 %   Eosinophils Absolute 0.2 0.0 - 0.7 K/uL   Basophils Relative 0 0 - 1 %   Basophils Absolute 0.1 0.0 - 0.1 K/uL  CBG monitoring, ED     Status: Abnormal   Collection Time: 03/31/15  1:32 PM  Result Value Ref Range   Glucose-Capillary 164 (H) 65 - 99 mg/dL  Comprehensive metabolic panel     Status: Abnormal   Collection Time: 04/28/15  8:00 AM  Result Value Ref Range   Sodium 132 (L) 135 - 145 mmol/L   Potassium 5.0 3.5 - 5.1 mmol/L   Chloride 96 (L) 101 - 111 mmol/L   CO2 26 22 - 32 mmol/L   Glucose, Bld 216 (H) 65 - 99 mg/dL   BUN 8 6 - 20 mg/dL   Creatinine, Ser 0.85 0.61 - 1.24 mg/dL   Calcium 9.5 8.9 - 10.3 mg/dL   Total Protein 7.1 6.5 - 8.1 g/dL   Albumin 4.3 3.5 - 5.0 g/dL   AST 18 15 - 41 U/L   ALT 19 17 - 63 U/L   Alkaline Phosphatase 85 38 - 126  U/L   Total Bilirubin 0.6 0.3 - 1.2 mg/dL   GFR calc non Af Amer >60 >60 mL/min   GFR calc Af Amer >60 >60 mL/min    Comment: (NOTE) The eGFR has been calculated using the CKD EPI equation. This calculation has not been validated in all clinical situations. eGFR's persistently <60 mL/min signify possible Chronic Kidney Disease.    Anion gap 10 5 - 15  CK     Status: None   Collection Time: 04/28/15  8:00 AM  Result Value Ref Range   Total CK 135 49 - 397 U/L  CBC with Differential/Platelet     Status: None   Collection Time: 04/28/15  8:00 AM  Result Value Ref Range   WBC 9.0 4.0 - 10.5 K/uL   RBC 4.79 4.22 - 5.81 MIL/uL   Hemoglobin 14.2 13.0 -  17.0 g/dL   HCT 42.5 39.0 - 52.0 %   MCV 88.7 78.0 - 100.0 fL   MCH 29.6 26.0 - 34.0 pg   MCHC 33.4 30.0 - 36.0 g/dL   RDW 13.6 11.5 - 15.5 %   Platelets 319 150 - 400 K/uL   Neutrophils Relative % 68 43 - 77 %   Neutro Abs 6.1 1.7 - 7.7 K/uL   Lymphocytes Relative 18 12 - 46 %   Lymphs Abs 1.7 0.7 - 4.0 K/uL   Monocytes Relative 11 3 - 12 %   Monocytes Absolute 1.0 0.1 - 1.0 K/uL   Eosinophils Relative 2 0 - 5 %   Eosinophils Absolute 0.2 0.0 - 0.7 K/uL   Basophils Relative 1 0 - 1 %   Basophils Absolute 0.1 0.0 - 0.1 K/uL  Urinalysis, Routine w reflex microscopic (not at [facility])     Status: None   Collection Time: 04/28/15  8:20 AM  Result Value Ref Range   Color, Urine YELLOW YELLOW   APPearance CLEAR CLEAR   Specific Gravity, Urine 1.006 1.005 - 1.030   pH 7.0 5.0 - 8.0   Glucose, UA NEGATIVE NEGATIVE mg/dL   Hgb urine dipstick NEGATIVE NEGATIVE   Bilirubin Urine NEGATIVE NEGATIVE   Ketones, ur NEGATIVE NEGATIVE mg/dL   Protein, ur NEGATIVE NEGATIVE mg/dL   Urobilinogen, UA 0.2 0.0 - 1.0 mg/dL   Nitrite NEGATIVE NEGATIVE   Leukocytes, UA NEGATIVE NEGATIVE    Comment: MICROSCOPIC NOT DONE ON URINES WITH NEGATIVE PROTEIN, BLOOD, LEUKOCYTES, NITRITE, OR GLUCOSE <1000 mg/dL.      Physical Exam: Constitutional:  BP  123/83 mmHg  Pulse 93  Ht 6' 1"  (1.854 m)  Wt 198 lb 3.2 oz (89.903 kg)  BMI 26.16 kg/m2  Musculoskeletal: Strength & Muscle Tone: within normal limits Gait & Station: normal Patient leans: N/A  Mental Status Examination;  Patient is casually dressed and fairly groomed.  He is pleasant and cooperative.  He maintained good eye contact.  His speech is slow but coherent.  He described his mood euthymic and his affect is appropriate.  He denies any paranoia , delusions or any obsessive thoughts.  He denies any active or passive suicidal thoughts or homicidal thoughts.  His attention and concentration is okay.  His thought process is logical and goal-directed.  His psychomotor activity is normal.  His fund of knowledge is average.  He is alert and oriented x3.  There were no tremors or shakes present.  His insight judgment and impulse control is good.   Established Problem, Stable/Improving (1), Review of Psycho-Social Stressors (1), Review or order clinical lab tests (1), Review and summation of old records (2), Review of Last Therapy Session (1), Review of Medication Regimen & Side Effects (2) and Review of New Medication or Change in Dosage (2)  Assessment: Axis I: Bipolar disorder NOS, rule out posttraumatic stress disorder  Axis II: Deferred  Axis III:  Past Medical History  Diagnosis Date  . Allergy   . Depression   . GERD (gastroesophageal reflux disease)   . Hyperlipidemia   . Chronic pain disorder     Sees [facility]  . Urinary incontinence     detrusor instability  . Hypogonadism   . Diabetes mellitus     Type II  . Chronic neck pain   . Bipolar depression   . Opiate dependence, continuous   . Polysubstance abuse   . Drug-seeking behavior    Plan:  I review  his blood work results, recent emergency room visits , psychosocial stressors and his current medication.  He is no longer taking any narcotic pain medication and benzodiazepine.  I reviewed his MRI  results of the abdominal .  Patient is still have chronic back pain and he does not want to take tramadol.  He likes to try low-dose gabapentin which helped his mood and pain in the past.  I strongly encouraged to see his medical doctor or pain specialist for the measurement of pain medication.  We will give him 1 time low-dose Neurontin 100 mg 3 times a day since patient has appointment with his pain specialist in few weeks.  Continue trazodone 100 mg at bedtime, Lamictal 200 mg daily and Cymbalta 60 mg twice a day.  Recommended to not take tramadol since it causes worsening of the depression.  Encouraged to keep therapist however due to busy schedule he has difficulty keeping the appointment.  I also discuss conflict of interest with him since I am seeing his girlfriend in this office.  Patient told he will discuss with his girlfriend and if needed he will schedule appointment with a different provider.  I recommended to call us back if he has any question, concern if he feel worsening of the symptom.  Patient is aware that we will not provide gabapentin in the future since he will contact with this pain specialist for the management of chronic pain.  I will see him again in 3 months. Time spent 25 minutes.  More than 50% of the time spent in psychoeducation, counseling and coordination of care.  Discuss safety plan that anytime having active suicidal thoughts or homicidal thoughts then patient need to call 911 or go to the local emergency room.  Syrina Wake T., MD 05/07/2015

## 2015-05-13 ENCOUNTER — Other Ambulatory Visit (HOSPITAL_COMMUNITY): Payer: Self-pay | Admitting: Psychiatry

## 2015-05-16 NOTE — Telephone Encounter (Signed)
Patient's refill request for Cymbalta was declined as [facility] plus 2 refills.

## 2015-05-19 DIAGNOSIS — Z0100 Encounter for examination of eyes and vision without abnormal findings: Secondary | ICD-10-CM | POA: Diagnosis not present

## 2015-05-25 ENCOUNTER — Telehealth (HOSPITAL_COMMUNITY): Payer: Self-pay

## 2015-05-25 DIAGNOSIS — F3131 Bipolar disorder, current episode depressed, mild: Secondary | ICD-10-CM

## 2015-05-25 DIAGNOSIS — M542 Cervicalgia: Secondary | ICD-10-CM | POA: Diagnosis not present

## 2015-05-25 NOTE — Telephone Encounter (Signed)
Medication management - patient states Neurontin really helping with pain and woudl like increased and possibly doubled until he can get in to see his neurosergeon.  Requests Dr. Lolly MustacheArfeen consider an increase and to send in a new order to his pharmacy.

## 2015-05-28 MED ORDER — GABAPENTIN 100 MG PO CAPS: 100.0000 mg | ORAL_CAPSULE | Freq: Two times a day (BID) | ORAL | Status: DC

## 2015-05-28 NOTE — Telephone Encounter (Signed)
He can take Neurontin 100 mg twice a day and should keep appointment with his neurosurgeon.  A new prescription of Neurontin 100 mg twice a day is sent to his pharmacy.

## 2015-05-31 ENCOUNTER — Ambulatory Visit (HOSPITAL_COMMUNITY): Payer: Self-pay | Admitting: Psychiatry

## 2015-05-31 DIAGNOSIS — J069 Acute upper respiratory infection, unspecified: Secondary | ICD-10-CM | POA: Diagnosis not present

## 2015-05-31 DIAGNOSIS — J209 Acute bronchitis, unspecified: Secondary | ICD-10-CM | POA: Diagnosis not present

## 2015-05-31 DIAGNOSIS — R062 Wheezing: Secondary | ICD-10-CM | POA: Diagnosis not present

## 2015-05-31 DIAGNOSIS — R05 Cough: Secondary | ICD-10-CM | POA: Diagnosis not present

## 2015-05-31 DIAGNOSIS — R509 Fever, unspecified: Secondary | ICD-10-CM | POA: Diagnosis not present

## 2015-06-01 DIAGNOSIS — R3915 Urgency of urination: Secondary | ICD-10-CM | POA: Diagnosis not present

## 2015-06-01 DIAGNOSIS — E291 Testicular hypofunction: Secondary | ICD-10-CM | POA: Diagnosis not present

## 2015-06-04 DIAGNOSIS — E291 Testicular hypofunction: Secondary | ICD-10-CM | POA: Diagnosis not present

## 2015-06-04 DIAGNOSIS — Z6827 Body mass index (BMI) 27.0-27.9, adult: Secondary | ICD-10-CM | POA: Diagnosis not present

## 2015-06-04 DIAGNOSIS — R3915 Urgency of urination: Secondary | ICD-10-CM | POA: Diagnosis not present

## 2015-06-04 DIAGNOSIS — M542 Cervicalgia: Secondary | ICD-10-CM | POA: Diagnosis not present

## 2015-06-10 DIAGNOSIS — G8929 Other chronic pain: Secondary | ICD-10-CM | POA: Diagnosis not present

## 2015-06-10 DIAGNOSIS — M542 Cervicalgia: Secondary | ICD-10-CM | POA: Diagnosis not present

## 2015-06-13 ENCOUNTER — Other Ambulatory Visit: Payer: Self-pay | Admitting: Endocrinology

## 2015-06-13 DIAGNOSIS — B889 Infestation, unspecified: Secondary | ICD-10-CM | POA: Diagnosis not present

## 2015-06-14 ENCOUNTER — Other Ambulatory Visit: Payer: Self-pay | Admitting: *Deleted

## 2015-06-14 ENCOUNTER — Telehealth: Payer: Self-pay | Admitting: Endocrinology

## 2015-06-14 DIAGNOSIS — J209 Acute bronchitis, unspecified: Secondary | ICD-10-CM | POA: Diagnosis not present

## 2015-06-14 DIAGNOSIS — J069 Acute upper respiratory infection, unspecified: Secondary | ICD-10-CM | POA: Diagnosis not present

## 2015-06-14 DIAGNOSIS — F17210 Nicotine dependence, cigarettes, uncomplicated: Secondary | ICD-10-CM | POA: Diagnosis not present

## 2015-06-14 DIAGNOSIS — R062 Wheezing: Secondary | ICD-10-CM | POA: Diagnosis not present

## 2015-06-14 MED ORDER — INSULIN LISPRO 100 UNIT/ML ~~LOC~~ SOLN: SUBCUTANEOUS | Status: DC

## 2015-06-14 MED ORDER — V-GO 20 KIT: PACK | Status: DC

## 2015-06-14 NOTE — Telephone Encounter (Signed)
V go refill and diabetic supplies rx needed [facility]

## 2015-06-14 NOTE — Telephone Encounter (Signed)
He made an appt can we bridge him

## 2015-06-14 NOTE — Telephone Encounter (Signed)
Those were denied because patient needs an appointment

## 2015-06-15 ENCOUNTER — Telehealth (HOSPITAL_COMMUNITY): Payer: Self-pay | Admitting: *Deleted

## 2015-06-15 DIAGNOSIS — M542 Cervicalgia: Secondary | ICD-10-CM | POA: Diagnosis not present

## 2015-06-15 DIAGNOSIS — R062 Wheezing: Secondary | ICD-10-CM | POA: Diagnosis not present

## 2015-06-15 NOTE — Telephone Encounter (Signed)
Patient called left a message that he is having racing thoughts and has for several weeks. They are getting worse and he would like his Lamictal increased.

## 2015-06-18 ENCOUNTER — Other Ambulatory Visit (HOSPITAL_COMMUNITY): Payer: Self-pay | Admitting: Psychiatry

## 2015-06-18 ENCOUNTER — Telehealth (HOSPITAL_COMMUNITY): Payer: Self-pay | Admitting: Psychiatry

## 2015-06-18 NOTE — Telephone Encounter (Signed)
I returned patient's phone call.  He admitted last week was stressful.  He was going through a custody battle but now he is feeling much better.  He sleeping better.  His physician started him on gabapentin 600 mg 4 times a day.  He does not feel that any further medicine needs to be adjusted.  Reassurance given.  Recommended to call us back if he has any question.

## 2015-06-21 ENCOUNTER — Other Ambulatory Visit: Payer: Self-pay | Admitting: Endocrinology

## 2015-06-22 ENCOUNTER — Other Ambulatory Visit: Payer: Self-pay | Admitting: Endocrinology

## 2015-06-24 DIAGNOSIS — J9801 Acute bronchospasm: Secondary | ICD-10-CM | POA: Diagnosis not present

## 2015-06-24 DIAGNOSIS — R05 Cough: Secondary | ICD-10-CM | POA: Diagnosis not present

## 2015-06-24 DIAGNOSIS — R600 Localized edema: Secondary | ICD-10-CM | POA: Diagnosis not present

## 2015-06-24 DIAGNOSIS — J040 Acute laryngitis: Secondary | ICD-10-CM | POA: Diagnosis not present

## 2015-06-24 DIAGNOSIS — R0689 Other abnormalities of breathing: Secondary | ICD-10-CM | POA: Diagnosis not present

## 2015-06-24 DIAGNOSIS — R0600 Dyspnea, unspecified: Secondary | ICD-10-CM | POA: Diagnosis not present

## 2015-06-24 DIAGNOSIS — J029 Acute pharyngitis, unspecified: Secondary | ICD-10-CM | POA: Diagnosis not present

## 2015-06-25 DIAGNOSIS — J029 Acute pharyngitis, unspecified: Secondary | ICD-10-CM | POA: Diagnosis not present

## 2015-06-25 DIAGNOSIS — J040 Acute laryngitis: Secondary | ICD-10-CM | POA: Diagnosis not present

## 2015-07-09 DIAGNOSIS — M542 Cervicalgia: Secondary | ICD-10-CM | POA: Diagnosis not present

## 2015-07-10 ENCOUNTER — Other Ambulatory Visit: Payer: Self-pay | Admitting: Endocrinology

## 2015-07-13 ENCOUNTER — Other Ambulatory Visit: Payer: Self-pay | Admitting: Neurological Surgery

## 2015-07-13 ENCOUNTER — Ambulatory Visit: Payer: Self-pay | Admitting: Endocrinology

## 2015-07-16 DIAGNOSIS — R062 Wheezing: Secondary | ICD-10-CM | POA: Diagnosis not present

## 2015-07-18 ENCOUNTER — Telehealth (HOSPITAL_COMMUNITY): Payer: Self-pay

## 2015-07-18 NOTE — Telephone Encounter (Signed)
Medication management - Telephone call with pt after he left a message stating he was having more problems as of late with increased racing thoughts, anxiety and at times fleating thougths to harm self.  Patient states he is under a lot of stress and pressure from work and going through a custody battle with his daughter.  Patient denies any current suicidal ideations or homicidal ideations but does admit at times he has thoughts to say "------ it all".  Patient denies any plans to harm self or intent at this time but requests Dr. Lolly Mustache possibly increase his Lamictal to help with his mood and to add something that may help with anxiety.  Patient any current danger to self or others but did express need for a possible medication adjustment that may help him more with stress related pressures and anxiety.  Agreed to send information and request to Dr. Lolly Mustache and will follow up with patient after discussing with Dr.Arfeen.  Patient scheduled next on 08/10/15.

## 2015-07-19 ENCOUNTER — Telehealth (HOSPITAL_COMMUNITY): Payer: Self-pay

## 2015-07-19 MED ORDER — QUETIAPINE FUMARATE 50 MG PO TABS: 50.0000 mg | ORAL_TABLET | Freq: Every day | ORAL | Status: DC

## 2015-07-19 NOTE — Telephone Encounter (Signed)
I returned phone call at his call back number and left a message.  I also tried to call his cell number but number is disconnected.

## 2015-07-19 NOTE — Telephone Encounter (Signed)
I returned patient's phone call.  His committing of increased irritability, mood swing, poor sleep and racing thoughts.  He is trying to get child custody and it has been difficult.  He denies any hallucination but endorsed some time passive and fleeting suicidal thoughts but no plan or any intent.  I recommended to try Seroquel 50 mg at bedtime to help insomnia and racing thoughts.  Discuss safety plan that anytime having active suicidal thoughts or homicidal thought but he need to call 911 or go to the local emergency room.

## 2015-07-20 ENCOUNTER — Other Ambulatory Visit: Payer: Self-pay | Admitting: Endocrinology

## 2015-07-21 ENCOUNTER — Other Ambulatory Visit: Payer: Self-pay | Admitting: Endocrinology

## 2015-07-25 ENCOUNTER — Ambulatory Visit (HOSPITAL_BASED_OUTPATIENT_CLINIC_OR_DEPARTMENT_OTHER): Payer: Medicare Other

## 2015-07-25 DIAGNOSIS — Z7189 Other specified counseling: Secondary | ICD-10-CM | POA: Diagnosis not present

## 2015-07-25 DIAGNOSIS — J449 Chronic obstructive pulmonary disease, unspecified: Secondary | ICD-10-CM | POA: Diagnosis not present

## 2015-07-25 DIAGNOSIS — R911 Solitary pulmonary nodule: Secondary | ICD-10-CM | POA: Diagnosis not present

## 2015-07-25 DIAGNOSIS — Z23 Encounter for immunization: Secondary | ICD-10-CM | POA: Diagnosis not present

## 2015-07-25 DIAGNOSIS — F17200 Nicotine dependence, unspecified, uncomplicated: Secondary | ICD-10-CM | POA: Diagnosis not present

## 2015-07-25 DIAGNOSIS — E119 Type 2 diabetes mellitus without complications: Secondary | ICD-10-CM | POA: Diagnosis not present

## 2015-07-25 DIAGNOSIS — Z13220 Encounter for screening for lipoid disorders: Secondary | ICD-10-CM | POA: Diagnosis not present

## 2015-07-25 DIAGNOSIS — Z0000 Encounter for general adult medical examination without abnormal findings: Secondary | ICD-10-CM | POA: Diagnosis not present

## 2015-07-25 DIAGNOSIS — E559 Vitamin D deficiency, unspecified: Secondary | ICD-10-CM | POA: Diagnosis not present

## 2015-07-25 DIAGNOSIS — Z794 Long term (current) use of insulin: Secondary | ICD-10-CM | POA: Diagnosis not present

## 2015-07-25 DIAGNOSIS — M542 Cervicalgia: Secondary | ICD-10-CM | POA: Diagnosis not present

## 2015-07-30 ENCOUNTER — Encounter (HOSPITAL_COMMUNITY): Payer: Self-pay | Admitting: *Deleted

## 2015-07-30 ENCOUNTER — Emergency Department (HOSPITAL_COMMUNITY)
Admission: EM | Admit: 2015-07-30 | Discharge: 2015-07-31 | Disposition: A | Discharge status: Other Acute Inpatient Hospital | Payer: Medicare Other | Attending: Emergency Medicine | Admitting: Emergency Medicine

## 2015-07-30 DIAGNOSIS — R45851 Suicidal ideations: Secondary | ICD-10-CM | POA: Diagnosis not present

## 2015-07-30 DIAGNOSIS — Z7982 Long term (current) use of aspirin: Secondary | ICD-10-CM | POA: Diagnosis not present

## 2015-07-30 DIAGNOSIS — Z7951 Long term (current) use of inhaled steroids: Secondary | ICD-10-CM | POA: Insufficient documentation

## 2015-07-30 DIAGNOSIS — G8929 Other chronic pain: Secondary | ICD-10-CM | POA: Diagnosis not present

## 2015-07-30 DIAGNOSIS — Z8719 Personal history of other diseases of the digestive system: Secondary | ICD-10-CM | POA: Diagnosis not present

## 2015-07-30 DIAGNOSIS — Z794 Long term (current) use of insulin: Secondary | ICD-10-CM | POA: Diagnosis not present

## 2015-07-30 DIAGNOSIS — Z720 Tobacco use: Secondary | ICD-10-CM | POA: Diagnosis not present

## 2015-07-30 DIAGNOSIS — F329 Major depressive disorder, single episode, unspecified: Secondary | ICD-10-CM

## 2015-07-30 DIAGNOSIS — Z79899 Other long term (current) drug therapy: Secondary | ICD-10-CM | POA: Diagnosis not present

## 2015-07-30 DIAGNOSIS — E785 Hyperlipidemia, unspecified: Secondary | ICD-10-CM | POA: Diagnosis not present

## 2015-07-30 DIAGNOSIS — E119 Type 2 diabetes mellitus without complications: Secondary | ICD-10-CM | POA: Diagnosis not present

## 2015-07-30 DIAGNOSIS — G4700 Insomnia, unspecified: Secondary | ICD-10-CM | POA: Insufficient documentation

## 2015-07-30 DIAGNOSIS — F411 Generalized anxiety disorder: Secondary | ICD-10-CM | POA: Diagnosis present

## 2015-07-30 DIAGNOSIS — F3130 Bipolar disorder, current episode depressed, mild or moderate severity, unspecified: Secondary | ICD-10-CM | POA: Diagnosis present

## 2015-07-30 DIAGNOSIS — F319 Bipolar disorder, unspecified: Secondary | ICD-10-CM | POA: Diagnosis not present

## 2015-07-30 DIAGNOSIS — F419 Anxiety disorder, unspecified: Secondary | ICD-10-CM | POA: Diagnosis not present

## 2015-07-30 DIAGNOSIS — F32A Depression, unspecified: Secondary | ICD-10-CM

## 2015-07-30 LAB — COMPREHENSIVE METABOLIC PANEL
ALK PHOS: 87 U/L (ref 38–126)
ALT: 16 U/L — ABNORMAL LOW (ref 17–63)
ANION GAP: 8 (ref 5–15)
AST: 20 U/L (ref 15–41)
Albumin: 4.1 g/dL (ref 3.5–5.0)
BUN: 9 mg/dL (ref 6–20)
CALCIUM: 9.5 mg/dL (ref 8.9–10.3)
CO2: 28 mmol/L (ref 22–32)
Chloride: 95 mmol/L — ABNORMAL LOW (ref 101–111)
Creatinine, Ser: 0.83 mg/dL (ref 0.61–1.24)
GFR calc non Af Amer: >60 mL/min (ref >60)
Glucose, Bld: 376 mg/dL — ABNORMAL HIGH (ref 65–99)
POTASSIUM: 3.8 mmol/L (ref 3.5–5.1)
Sodium: 131 mmol/L — ABNORMAL LOW (ref 135–145)
TOTAL PROTEIN: 7.1 g/dL (ref 6.5–8.1)
Total Bilirubin: 0.5 mg/dL (ref 0.3–1.2)

## 2015-07-30 LAB — RAPID URINE DRUG SCREEN, HOSP PERFORMED
AMPHETAMINES: NOT DETECTED
BENZODIAZEPINES: NOT DETECTED
Barbiturates: NOT DETECTED
COCAINE: NOT DETECTED
Opiates: NOT DETECTED
Tetrahydrocannabinol: NOT DETECTED

## 2015-07-30 LAB — CBC
HEMATOCRIT: 38 % — AB (ref 39.0–52.0)
Hemoglobin: 12.9 g/dL — ABNORMAL LOW (ref 13.0–17.0)
MCH: 29.7 pg (ref 26.0–34.0)
MCHC: 33.9 g/dL (ref 30.0–36.0)
MCV: 87.6 fL (ref 78.0–100.0)
PLATELETS: 235 10*3/uL (ref 150–400)
RBC: 4.34 MIL/uL (ref 4.22–5.81)
RDW: 14 % (ref 11.5–15.5)
WBC: 9.6 10*3/uL (ref 4.0–10.5)

## 2015-07-30 LAB — ACETAMINOPHEN LEVEL

## 2015-07-30 LAB — CBG MONITORING, ED: Glucose-Capillary: 278 mg/dL — ABNORMAL HIGH (ref 65–99)

## 2015-07-30 LAB — SALICYLATE LEVEL

## 2015-07-30 LAB — ETHANOL: Alcohol, Ethyl (B): <5 mg/dL (ref <5)

## 2015-07-30 MED ORDER — FLUTICASONE PROPIONATE 50 MCG/ACT NA SUSP
1.0000 | Freq: Every day | NASAL | Status: DC
Start: 2015-07-31 — End: 2015-07-31
  Administered 2015-07-31: 1 via NASAL
  Filled 2015-07-30: qty 16

## 2015-07-30 MED ORDER — ALBUTEROL SULFATE HFA 108 (90 BASE) MCG/ACT IN AERS: 2.0000 | INHALATION_SPRAY | Freq: Four times a day (QID) | RESPIRATORY_TRACT | Status: DC | PRN

## 2015-07-30 MED ORDER — PIOGLITAZONE HCL 30 MG PO TABS
30.0000 mg | ORAL_TABLET | Freq: Every day | ORAL | Status: DC
  Administered 2015-07-31: 30 mg via ORAL
  Filled 2015-07-30: qty 1

## 2015-07-30 MED ORDER — QUETIAPINE FUMARATE 50 MG PO TABS
50.0000 mg | ORAL_TABLET | Freq: Every day | ORAL | Status: DC
  Administered 2015-07-30: 50 mg via ORAL
  Filled 2015-07-30: qty 1

## 2015-07-30 MED ORDER — SODIUM CHLORIDE 1 G PO TABS
1.0000 g | ORAL_TABLET | Freq: Two times a day (BID) | ORAL | Status: DC
  Administered 2015-07-31: 1 g via ORAL
  Filled 2015-07-30 (×3): qty 1

## 2015-07-30 MED ORDER — ASPIRIN EC 81 MG PO TBEC
81.0000 mg | DELAYED_RELEASE_TABLET | Freq: Every day | ORAL | Status: DC
  Administered 2015-07-31: 81 mg via ORAL
  Filled 2015-07-30 (×2): qty 1

## 2015-07-30 MED ORDER — DULOXETINE HCL 60 MG PO CPEP
60.0000 mg | ORAL_CAPSULE | Freq: Two times a day (BID) | ORAL | Status: DC
  Administered 2015-07-30 – 2015-07-31 (×2): 60 mg via ORAL
  Filled 2015-07-30 (×4): qty 1

## 2015-07-30 MED ORDER — GABAPENTIN 400 MG PO CAPS
800.0000 mg | ORAL_CAPSULE | Freq: Three times a day (TID) | ORAL | Status: DC
  Administered 2015-07-30 – 2015-07-31 (×2): 800 mg via ORAL
  Filled 2015-07-30 (×2): qty 2

## 2015-07-30 MED ORDER — AZELASTINE HCL 0.1 % NA SOLN
2.0000 | Freq: Two times a day (BID) | NASAL | Status: DC
  Administered 2015-07-30: 2 via NASAL
  Filled 2015-07-30: qty 30

## 2015-07-30 MED ORDER — RAMIPRIL 2.5 MG PO CAPS
2.5000 mg | ORAL_CAPSULE | Freq: Every day | ORAL | Status: DC
  Administered 2015-07-31: 2.5 mg via ORAL
  Filled 2015-07-30: qty 1

## 2015-07-30 MED ORDER — TESTOSTERONE 75 MG IL PLLT: 150.0000 mg | PELLET | Status: DC

## 2015-07-30 MED ORDER — SIMVASTATIN 10 MG PO TABS
10.0000 mg | ORAL_TABLET | Freq: Every day | ORAL | Status: DC
Start: 2015-07-30 — End: 2015-07-31
  Administered 2015-07-30: 10 mg via ORAL
  Filled 2015-07-30 (×2): qty 1

## 2015-07-30 MED ORDER — INSULIN GLARGINE 100 UNIT/ML ~~LOC~~ SOLN
30.0000 [IU] | Freq: Every day | SUBCUTANEOUS | Status: DC
  Filled 2015-07-30 (×2): qty 0.3

## 2015-07-30 MED ORDER — LORATADINE 10 MG PO TABS
10.0000 mg | ORAL_TABLET | Freq: Every day | ORAL | Status: DC
  Administered 2015-07-31: 10 mg via ORAL
  Filled 2015-07-30: qty 1

## 2015-07-30 MED ORDER — LAMOTRIGINE 200 MG PO TABS
200.0000 mg | ORAL_TABLET | Freq: Every day | ORAL | Status: DC
  Administered 2015-07-31: 200 mg via ORAL
  Filled 2015-07-30: qty 1

## 2015-07-30 MED ORDER — HYDROXYZINE HCL 25 MG PO TABS: 50.0000 mg | ORAL_TABLET | Freq: Three times a day (TID) | ORAL | Status: DC | PRN

## 2015-07-30 MED ORDER — MIRABEGRON ER 50 MG PO TB24
50.0000 mg | ORAL_TABLET | Freq: Every day | ORAL | Status: DC
  Administered 2015-07-31: 50 mg via ORAL
  Filled 2015-07-30: qty 1

## 2015-07-30 NOTE — ED Notes (Signed)
Per patient, last few weeks, he figures it is related to bipolar, really bad rage and anger, SI thoughts, super depressed. Just dont give a damn. Lots of stressors, custody battle, ex- wife. He thinks about hanging himself and can see himself hanging. He states he is afraid of himself. States he doesn't not want to die, insomnia, called psychiartrist and got medication called in to sleep. Thoughts to hurt self and others. Does not dwell on subject. Takes medication daily.

## 2015-07-30 NOTE — ED Notes (Signed)
Patient is resting comfortably. 

## 2015-07-30 NOTE — ED Notes (Signed)
[name], fiance- [phone]

## 2015-07-30 NOTE — ED Provider Notes (Signed)
CSN: [id number]     Arrival date & time 07/30/15  1945 History   First MD Initiated Contact with Patient 07/30/15 2208     No chief complaint on file.    (Consider location/radiation/quality/duration/timing/severity/associated sxs/prior Treatment) HPI Comments: This is a 39 year old male with chronic medical conditions including using diabetes, asthma, seasonal allergies, hyperlipidemia, hypertension, hypogonadism, polysubstance abuse, chronic pain, presents today with worsening depression, suicidality, anxiety. Marzetta Board spoke with his psychiatrist last week who increased or changed his trazodone to Seroquel help him sleep now.  He states that he can fall asleep, but he does not stay asleep.  He thought about hanging himself.  He dreams about seeing himself hanging. States he took all of his medications today except his nighttime Seroquel  The history is provided by the patient.    Past Medical History  Diagnosis Date  . Allergy   . Depression   . GERD (gastroesophageal reflux disease)   . Hyperlipidemia   . Chronic pain disorder     Sees [facility]  . Urinary incontinence     detrusor instability  . Hypogonadism   . Diabetes mellitus     Type II  . Chronic neck pain   . Bipolar depression   . Opiate dependence, continuous   . Polysubstance abuse   . Drug-seeking behavior    Past Surgical History  Procedure Laterality Date  . Appendectomy    . Hernia repair    . Nasal sinus surgery  2008   Family History  Problem Relation Age of Onset  . Diabetes Mother   . Hypertension Mother   . Cirrhosis Mother   . Depression Mother   . Bipolar disorder Mother   . Arthritis Father 47    osteoarthritis  . Bipolar disorder Father   . Diabetes Sister     borderline  . Heart disease Paternal Uncle 37    MI  . Heart disease Maternal Grandfather     late 60's--MI  . Cancer Paternal Grandmother 70    lung  . Bipolar disorder Paternal Grandmother   . Heart disease  Paternal Grandfather 3    MI  . Bipolar disorder Paternal Grandfather    Social History  Substance Use Topics  . Smoking status: Current Every Day Smoker -- 2.50 packs/day    Types: Cigarettes  . Smokeless tobacco: Never Used  . Alcohol Use: No    Review of Systems  Constitutional: Negative for fever.  Respiratory: Negative for shortness of breath.   Endocrine: Negative for polydipsia and polyphagia.  Neurological: Negative for headaches.  Psychiatric/Behavioral: Positive for suicidal ideas. The patient is nervous/anxious.   All other systems reviewed and are negative.     Allergies  Ciprofloxacin  Home Medications   Prior to Admission medications   Medication Sig Start Date End Date Taking? Authorizing Liseth Wann  aspirin EC 81 MG tablet Take 81 mg by mouth daily.   Yes Historical Mendy Chou, MD  cetirizine (ZYRTEC) 10 MG tablet Take 1 tablet (10 mg total) by mouth daily. For allergies 03/23/14  Yes Encarnacion Slates, NP  CIALIS 5 MG tablet Take 5 mg by mouth daily as needed. Erectile dysfunction. 07/27/15  Yes Historical Sheniece Ruggles, MD  DULoxetine (CYMBALTA) 60 MG capsule Take 1 capsule (60 mg total) by mouth 2 (two) times daily. 05/07/15  Yes Kathlee Nations, MD  fluticasone (FLONASE) 50 MCG/ACT nasal spray PLACE 2 SPRAYS INTO BOTH NOSTRILS DAILY. 04/16/15  Yes Edward Saguier, PA-C  Fluticasone Furoate-Vilanterol (BREO ELLIPTA)  100-25 MCG/INH AEPB Inhale 1 puff into the lungs daily.   Yes Historical Lacorey Brusca, MD  gabapentin (NEURONTIN) 800 MG tablet Take 800 mg by mouth 3 (three) times daily.   Yes Historical Will Heinkel, MD  HUMALOG 100 UNIT/ML injection USE AS DIRECTED IN INSULIN PUMP *MAX 56 UNITS PER DAY 07/23/15  Yes Elayne Snare, MD  hydrOXYzine (VISTARIL) 50 MG capsule Take 50 mg by mouth 3 (three) times daily as needed (for anxiety).   Yes Historical Jondavid Schreier, MD  Insulin Disposable Pump (V-GO 20) KIT USE ONE PER DAY, NO FURTHER REFILLS WILL BE GIVEN UNTIL HE'S SEEN IN THE OFFICE 07/10/15   Yes Elayne Snare, MD  ipratropium-albuterol (DUONEB) 0.5-2.5 (3) MG/3ML SOLN Take 3 mLs by nebulization every 4 (four) hours as needed (for wheezing/shortness of breath).   Yes Historical Daniele Dillow, MD  lamoTRIgine (LAMICTAL) 200 MG tablet Take 1 tablet (200 mg total) by mouth daily. Take in the morning for mood control. 05/07/15  Yes Kathlee Nations, MD  mirabegron ER (MYRBETRIQ) 50 MG TB24 tablet Take 50 mg by mouth daily.   Yes Historical Dezmon Conover, MD  pioglitazone (ACTOS) 30 MG tablet TAKE 1 TABLET (30 MG TOTAL) BY MOUTH DAILY. 07/20/15  Yes Elayne Snare, MD  QUEtiapine (SEROQUEL) 50 MG tablet Take 1 tablet (50 mg total) by mouth at bedtime. 07/19/15 07/18/16 Yes Kathlee Nations, MD  ramipril (ALTACE) 2.5 MG capsule Take 2.5 mg by mouth daily.   Yes Historical Avaiah Stempel, MD  simvastatin (ZOCOR) 10 MG tablet Take 10 mg by mouth at bedtime.   Yes Historical Alford Gamero, MD  SUMAtriptan (IMITREX) 25 MG tablet Take 25 mg by mouth every 2 (two) hours as needed for migraine. May repeat in 2 hours if headache persists or recurs.   Yes Historical Aliene Tamura, MD  Testosterone 75 MG PLLT Inject 150 mg into the skin every 3 (three) months.  09/15/14  Yes Historical Lamees Gable, MD  tiZANidine (ZANAFLEX) 2 MG tablet Take 2-4 mg by mouth every 8 (eight) hours as needed. Muscle spasms 05/03/15  Yes Historical Savera Donson, MD  albuterol (PROVENTIL HFA;VENTOLIN HFA) 108 (90 BASE) MCG/ACT inhaler Inhale 2 puffs into the lungs every 6 (six) hours as needed for wheezing or shortness of breath. Patient not taking: Reported on 07/30/2015 08/22/14   Debbrah Alar, NP  azelastine (ASTELIN) 0.1 % nasal spray Place 2 sprays into both nostrils 2 (two) times daily. Use in each nostril as directed Patient not taking: Reported on 07/30/2015 09/27/14   Niel Hummer, NP  gabapentin (NEURONTIN) 100 MG capsule Take 1 capsule (100 mg total) by mouth 2 (two) times daily. Patient not taking: Reported on 07/30/2015 05/28/15 05/27/16  Kathlee Nations, MD  HUMALOG  100 UNIT/ML injection USE AS DIRECTED IN INSULIN PUMP Patient not taking: Reported on 07/30/2015 06/22/15   Elayne Snare, MD  insulin aspart (NOVOLOG) 100 UNIT/ML FlexPen Inject TID before meals per sliding scale. Patient not taking: Reported on 07/30/2015 01/26/15   Debbrah Alar, NP  Insulin Glargine (LANTUS SOLOSTAR) 100 UNIT/ML Solostar Pen Inject 30 Units into the skin at bedtime. Patient not taking: Reported on 02/21/2015 02/14/15   Elayne Snare, MD  sodium chloride 1 G tablet Take 1 tablet (1 g total) by mouth 2 (two) times daily with a meal. Patient not taking: Reported on 02/06/2015 01/30/15   Debby Freiberg, MD  traZODone (DESYREL) 100 MG tablet Take 1 tablet (100 mg total) by mouth at bedtime. Patient not taking: Reported on 07/30/2015 05/07/15   Dossie Der T  Arfeen, MD   BP 116/71 mmHg  Pulse 72  Resp 16  SpO2 93% Physical Exam  Constitutional: He is oriented to person, place, and time. He appears well-developed and well-nourished.  HENT:  Head: Normocephalic.  Eyes: Pupils are equal, round, and reactive to light.  Neck: Normal range of motion.  Cardiovascular: Normal rate and regular rhythm.   Pulmonary/Chest: Effort normal and breath sounds normal.  Musculoskeletal: Normal range of motion.  Neurological: He is alert and oriented to person, place, and time.  Skin: Skin is warm.  Psychiatric: His speech is normal and behavior is normal. Judgment normal. His mood appears anxious. Cognition and memory are normal. He exhibits a depressed mood. He expresses suicidal ideation. He expresses suicidal plans.  Nursing note and vitals reviewed.   ED Course  Procedures (including critical care time) Labs Review Labs Reviewed  COMPREHENSIVE METABOLIC PANEL - Abnormal; Notable for the following:    Sodium 131 (*)    Chloride 95 (*)    Glucose, Bld 376 (*)    ALT 16 (*)    All other components within normal limits  ACETAMINOPHEN LEVEL - Abnormal; Notable for the following:    Acetaminophen  (Tylenol), Serum <10 (*)    All other components within normal limits  CBC - Abnormal; Notable for the following:    Hemoglobin 12.9 (*)    HCT 38.0 (*)    All other components within normal limits  CBG MONITORING, ED - Abnormal; Notable for the following:    Glucose-Capillary 278 (*)    All other components within normal limits  ETHANOL  SALICYLATE LEVEL  URINE RAPID DRUG SCREEN, HOSP PERFORMED    Imaging Review No results found. I have personally reviewed and evaluated these images and lab results as part of my medical decision-making.   EKG Interpretation None     Patient has been assessed by a TTS awaiting disco by them No disposition on my shift MDM   Final diagnoses:  Bipolar 1 disorder  Depression  Insomnia         Junius Creamer, NP 07/31/15 9622  Leonard Schwartz, MD 08/02/15 1101

## 2015-07-31 ENCOUNTER — Encounter (HOSPITAL_COMMUNITY): Payer: Self-pay | Admitting: *Deleted

## 2015-07-31 ENCOUNTER — Inpatient Hospital Stay (HOSPITAL_COMMUNITY)
Admission: AD | Admit: 2015-07-31 | Discharge: 2015-08-06 | DRG: 885 | Disposition: A | Discharge status: Home / Self Care | Payer: 59 | Source: Intra-hospital | Attending: Psychiatry | Admitting: Psychiatry

## 2015-07-31 DIAGNOSIS — I10 Essential (primary) hypertension: Secondary | ICD-10-CM | POA: Diagnosis present

## 2015-07-31 DIAGNOSIS — IMO0002 Reserved for concepts with insufficient information to code with codable children: Secondary | ICD-10-CM | POA: Diagnosis present

## 2015-07-31 DIAGNOSIS — K219 Gastro-esophageal reflux disease without esophagitis: Secondary | ICD-10-CM | POA: Diagnosis present

## 2015-07-31 DIAGNOSIS — R45851 Suicidal ideations: Secondary | ICD-10-CM | POA: Diagnosis present

## 2015-07-31 DIAGNOSIS — F1024 Alcohol dependence with alcohol-induced mood disorder: Secondary | ICD-10-CM | POA: Diagnosis present

## 2015-07-31 DIAGNOSIS — E1165 Type 2 diabetes mellitus with hyperglycemia: Secondary | ICD-10-CM | POA: Diagnosis not present

## 2015-07-31 DIAGNOSIS — J449 Chronic obstructive pulmonary disease, unspecified: Secondary | ICD-10-CM | POA: Diagnosis present

## 2015-07-31 DIAGNOSIS — G4700 Insomnia, unspecified: Secondary | ICD-10-CM | POA: Insufficient documentation

## 2015-07-31 DIAGNOSIS — Z8719 Personal history of other diseases of the digestive system: Secondary | ICD-10-CM | POA: Diagnosis not present

## 2015-07-31 DIAGNOSIS — F319 Bipolar disorder, unspecified: Secondary | ICD-10-CM | POA: Diagnosis not present

## 2015-07-31 DIAGNOSIS — Z7951 Long term (current) use of inhaled steroids: Secondary | ICD-10-CM | POA: Diagnosis not present

## 2015-07-31 DIAGNOSIS — Z79899 Other long term (current) drug therapy: Secondary | ICD-10-CM | POA: Diagnosis not present

## 2015-07-31 DIAGNOSIS — F411 Generalized anxiety disorder: Secondary | ICD-10-CM | POA: Diagnosis present

## 2015-07-31 DIAGNOSIS — Z794 Long term (current) use of insulin: Secondary | ICD-10-CM

## 2015-07-31 DIAGNOSIS — E119 Type 2 diabetes mellitus without complications: Secondary | ICD-10-CM | POA: Diagnosis not present

## 2015-07-31 DIAGNOSIS — E785 Hyperlipidemia, unspecified: Secondary | ICD-10-CM | POA: Diagnosis present

## 2015-07-31 DIAGNOSIS — F419 Anxiety disorder, unspecified: Secondary | ICD-10-CM | POA: Diagnosis present

## 2015-07-31 DIAGNOSIS — Z7982 Long term (current) use of aspirin: Secondary | ICD-10-CM | POA: Diagnosis not present

## 2015-07-31 DIAGNOSIS — F332 Major depressive disorder, recurrent severe without psychotic features: Secondary | ICD-10-CM | POA: Diagnosis present

## 2015-07-31 DIAGNOSIS — G8929 Other chronic pain: Secondary | ICD-10-CM | POA: Diagnosis not present

## 2015-07-31 DIAGNOSIS — F3130 Bipolar disorder, current episode depressed, mild or moderate severity, unspecified: Secondary | ICD-10-CM | POA: Diagnosis not present

## 2015-07-31 DIAGNOSIS — F3131 Bipolar disorder, current episode depressed, mild: Secondary | ICD-10-CM

## 2015-07-31 DIAGNOSIS — R454 Irritability and anger: Secondary | ICD-10-CM | POA: Diagnosis present

## 2015-07-31 DIAGNOSIS — Z9641 Presence of insulin pump (external) (internal): Secondary | ICD-10-CM | POA: Diagnosis present

## 2015-07-31 DIAGNOSIS — F17200 Nicotine dependence, unspecified, uncomplicated: Secondary | ICD-10-CM | POA: Diagnosis present

## 2015-07-31 DIAGNOSIS — F17210 Nicotine dependence, cigarettes, uncomplicated: Secondary | ICD-10-CM | POA: Diagnosis present

## 2015-07-31 DIAGNOSIS — Z720 Tobacco use: Secondary | ICD-10-CM | POA: Diagnosis not present

## 2015-07-31 DIAGNOSIS — J42 Unspecified chronic bronchitis: Secondary | ICD-10-CM | POA: Diagnosis not present

## 2015-07-31 LAB — GLUCOSE, CAPILLARY
GLUCOSE-CAPILLARY: 346 mg/dL — AB (ref 65–99)
GLUCOSE-CAPILLARY: 136 mg/dL — AB (ref 65–99)

## 2015-07-31 LAB — CBG MONITORING, ED
GLUCOSE-CAPILLARY: 266 mg/dL — AB (ref 65–99)
Glucose-Capillary: 269 mg/dL — ABNORMAL HIGH (ref 65–99)

## 2015-07-31 MED ORDER — FLUTICASONE PROPIONATE 50 MCG/ACT NA SUSP
1.0000 | Freq: Every day | NASAL | Status: DC
  Administered 2015-08-01: 1 via NASAL
  Filled 2015-07-31: qty 16

## 2015-07-31 MED ORDER — AZELASTINE HCL 0.1 % NA SOLN
2.0000 | Freq: Two times a day (BID) | NASAL | Status: DC
  Filled 2015-07-31 (×2): qty 30

## 2015-07-31 MED ORDER — HYDROXYZINE HCL 50 MG PO TABS
50.0000 mg | ORAL_TABLET | Freq: Three times a day (TID) | ORAL | Status: DC | PRN
  Administered 2015-08-01 (×3): 50 mg via ORAL
  Filled 2015-07-31 (×3): qty 1

## 2015-07-31 MED ORDER — PIOGLITAZONE HCL 30 MG PO TABS
30.0000 mg | ORAL_TABLET | Freq: Every day | ORAL | Status: DC
  Administered 2015-08-01 – 2015-08-06 (×6): 30 mg via ORAL
  Filled 2015-07-31 (×7): qty 1

## 2015-07-31 MED ORDER — RAMIPRIL 2.5 MG PO CAPS
2.5000 mg | ORAL_CAPSULE | Freq: Every day | ORAL | Status: DC
Start: 2015-08-01 — End: 2015-08-06
  Administered 2015-08-01 – 2015-08-06 (×6): 2.5 mg via ORAL
  Filled 2015-07-31 (×7): qty 1

## 2015-07-31 MED ORDER — ALBUTEROL SULFATE HFA 108 (90 BASE) MCG/ACT IN AERS: 2.0000 | INHALATION_SPRAY | Freq: Four times a day (QID) | RESPIRATORY_TRACT | Status: DC | PRN

## 2015-07-31 MED ORDER — LORATADINE 10 MG PO TABS
10.0000 mg | ORAL_TABLET | Freq: Every day | ORAL | Status: DC
  Administered 2015-08-01 – 2015-08-06 (×5): 10 mg via ORAL
  Filled 2015-07-31 (×8): qty 1

## 2015-07-31 MED ORDER — ASPIRIN EC 81 MG PO TBEC
81.0000 mg | DELAYED_RELEASE_TABLET | Freq: Every day | ORAL | Status: DC
  Administered 2015-08-01 – 2015-08-06 (×6): 81 mg via ORAL
  Filled 2015-07-31 (×7): qty 1

## 2015-07-31 MED ORDER — GABAPENTIN 400 MG PO CAPS
800.0000 mg | ORAL_CAPSULE | Freq: Three times a day (TID) | ORAL | Status: DC
  Administered 2015-07-31 – 2015-08-06 (×17): 800 mg via ORAL
  Filled 2015-07-31 (×20): qty 2

## 2015-07-31 MED ORDER — MIRABEGRON ER 25 MG PO TB24
50.0000 mg | ORAL_TABLET | Freq: Every day | ORAL | Status: DC
  Administered 2015-08-01 – 2015-08-06 (×6): 50 mg via ORAL
  Filled 2015-07-31 (×3): qty 1
  Filled 2015-07-31: qty 2
  Filled 2015-07-31: qty 1
  Filled 2015-07-31: qty 2
  Filled 2015-07-31 (×2): qty 1

## 2015-07-31 MED ORDER — INSULIN GLARGINE 100 UNIT/ML ~~LOC~~ SOLN
30.0000 [IU] | Freq: Every day | SUBCUTANEOUS | Status: DC
  Administered 2015-08-02: 30 [IU] via SUBCUTANEOUS

## 2015-07-31 MED ORDER — INSULIN ASPART 100 UNIT/ML ~~LOC~~ SOLN
0.0000 [IU] | Freq: Three times a day (TID) | SUBCUTANEOUS | Status: DC
  Administered 2015-07-31 (×2): 8 [IU] via SUBCUTANEOUS
  Filled 2015-07-31 (×2): qty 1

## 2015-07-31 MED ORDER — INSULIN ASPART 100 UNIT/ML ~~LOC~~ SOLN
0.0000 [IU] | Freq: Three times a day (TID) | SUBCUTANEOUS | Status: DC
  Administered 2015-07-31: 11 [IU] via SUBCUTANEOUS
  Administered 2015-08-01: 8 [IU] via SUBCUTANEOUS
  Administered 2015-08-01 (×2): 11 [IU] via SUBCUTANEOUS
  Administered 2015-08-02: 8 [IU] via SUBCUTANEOUS
  Administered 2015-08-02: 5 [IU] via SUBCUTANEOUS
  Administered 2015-08-03 (×2): 8 [IU] via SUBCUTANEOUS

## 2015-07-31 MED ORDER — LAMOTRIGINE 200 MG PO TABS
200.0000 mg | ORAL_TABLET | Freq: Every day | ORAL | Status: DC
  Administered 2015-08-01 – 2015-08-06 (×6): 200 mg via ORAL
  Filled 2015-07-31 (×7): qty 1

## 2015-07-31 MED ORDER — SODIUM CHLORIDE 1 G PO TABS
1.0000 g | ORAL_TABLET | Freq: Two times a day (BID) | ORAL | Status: DC
  Administered 2015-07-31 – 2015-08-06 (×12): 1 g via ORAL
  Filled 2015-07-31 (×14): qty 1

## 2015-07-31 MED ORDER — DULOXETINE HCL 60 MG PO CPEP
60.0000 mg | ORAL_CAPSULE | Freq: Two times a day (BID) | ORAL | Status: DC
  Administered 2015-07-31 – 2015-08-03 (×6): 60 mg via ORAL
  Filled 2015-07-31 (×8): qty 1

## 2015-07-31 MED ORDER — NICOTINE 21 MG/24HR TD PT24
21.0000 mg | MEDICATED_PATCH | Freq: Every day | TRANSDERMAL | Status: DC
  Administered 2015-07-31 – 2015-08-06 (×7): 21 mg via TRANSDERMAL
  Filled 2015-07-31 (×9): qty 1

## 2015-07-31 MED ORDER — QUETIAPINE FUMARATE 50 MG PO TABS
50.0000 mg | ORAL_TABLET | Freq: Every day | ORAL | Status: DC
  Administered 2015-07-31: 50 mg via ORAL
  Filled 2015-07-31 (×3): qty 1

## 2015-07-31 MED ORDER — SIMVASTATIN 10 MG PO TABS
10.0000 mg | ORAL_TABLET | Freq: Every day | ORAL | Status: DC
  Administered 2015-07-31 – 2015-08-05 (×5): 10 mg via ORAL
  Filled 2015-07-31 (×7): qty 1

## 2015-07-31 NOTE — BH Assessment (Signed)
Sent referrals to: [address], [employer], [place], [place], [place] Triage Specialist 07/31/2015 2:02 AM

## 2015-07-31 NOTE — ED Notes (Signed)
Dr Loretha Stapler called for SSI orders, pt sts that his insulin pump is out, will cover pt AC and HS

## 2015-07-31 NOTE — ED Notes (Signed)
[name] insulin pump removed - will follow SSI orders, pt updated and agrees with plan

## 2015-07-31 NOTE — BH Assessment (Signed)
Reviewed ED notes prior to initiating assessment. Per notes pt has been having rage, mood lability, SI and thoughts of harm to others.   Assessment to commence once pt is roomed.    Clista Bernhardt, [facility] Triage Specialist 07/31/2015 12:02 AM
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
## 2015-07-31 NOTE — Tx Team (Signed)
Initial Interdisciplinary Treatment Plan   PATIENT STRESSORS: Financial difficulties Legal issue Occupational concerns   PATIENT STRENGTHS: Ability for insight Capable of independent living Communication skills Supportive family/friends   PROBLEM LIST: Problem List/Patient Goals Date to be addressed Date deferred Reason deferred Estimated date of resolution  "been depressed lately" 07/31/2015      "thoughts of self-harm" 07/31/2015      "Going through a custody battle with my ex-wife" 07/31/2015      "My health is failing" 07/31/2015                                     DISCHARGE CRITERIA:  Improved stabilization in mood, thinking, and/or behavior Need for constant or close observation no longer present Reduction of life-threatening or endangering symptoms to within safe limits  PRELIMINARY DISCHARGE PLAN: Outpatient therapy Return to previous living arrangement Return to previous work or school arrangements  PATIENT/FAMIILY INVOLVEMENT: This treatment plan has been presented to and reviewed with the patient, Reyn Faivre..  The patient and family have been given the opportunity to ask questions and make suggestions.  Larry Sierras P 07/31/2015, 5:05 PM

## 2015-07-31 NOTE — ED Notes (Signed)
[facility] counselor with patient.

## 2015-07-31 NOTE — ED Notes (Signed)
Pt has been resting quietly throughout the night, NAD

## 2015-07-31 NOTE — ED Notes (Signed)
Imogene Burn Loss adjuster, chartered) [phone]

## 2015-07-31 NOTE — BH Assessment (Signed)
BHH Assessment Progress Note  Per Thedore Mins, MD, this pt requires psychiatric hospitalization at this time.  Rosey Bath, RN, [facility] has assigned pt to [facility] Rm 407-1 with a caveat that pt is to be sent as close to 14:00 as possible.  Pt has signed Voluntary Admission and Consent for Treatment, as well as Consent to Release Information to Kathryne Sharper, MD, and a notification call has been placed.  Signed forms have been faxed to [facility].  [name] nurse has been notified, and agrees to send original paperwork along with pt via [place], and to call report to [phone].  Doylene Canning, MA Triage Specialist [phone]

## 2015-07-31 NOTE — Progress Notes (Signed)
Adult Psychoeducational Group Note  Date:  07/31/2015 Time:  10:13 PM  Group Topic/Focus:  Wrap-Up Group:   The focus of this group is to help patients review their daily goal of treatment and discuss progress on daily workbooks.  Participation Level:  Active  Participation Quality:  Appropriate  Affect:  Appropriate  Cognitive:  Alert  Insight: Appropriate  Engagement in Group:  Engaged  Modes of Intervention:  Discussion  Additional Comments:  Patient stated he is just getting here and trying to adjust to everything. Patient stated a positive thing that happened today was his fiance coming to visit him.  Taffney L Deas 07/31/2015, 10:13 PM

## 2015-08-01 ENCOUNTER — Telehealth (HOSPITAL_COMMUNITY): Payer: Self-pay

## 2015-08-01 ENCOUNTER — Other Ambulatory Visit (HOSPITAL_COMMUNITY): Payer: Self-pay | Admitting: Psychiatry

## 2015-08-01 ENCOUNTER — Encounter (HOSPITAL_COMMUNITY): Payer: Self-pay | Admitting: Psychiatry

## 2015-08-01 DIAGNOSIS — R45851 Suicidal ideations: Secondary | ICD-10-CM

## 2015-08-01 DIAGNOSIS — F3130 Bipolar disorder, current episode depressed, mild or moderate severity, unspecified: Principal | ICD-10-CM

## 2015-08-01 LAB — GLUCOSE, CAPILLARY
GLUCOSE-CAPILLARY: 326 mg/dL — AB (ref 65–99)
GLUCOSE-CAPILLARY: 350 mg/dL — AB (ref 65–99)
Glucose-Capillary: 298 mg/dL — ABNORMAL HIGH (ref 65–99)
Glucose-Capillary: 205 mg/dL — ABNORMAL HIGH (ref 65–99)

## 2015-08-01 MED ORDER — LORAZEPAM 0.5 MG PO TABS: 0.5000 mg | ORAL_TABLET | Freq: Four times a day (QID) | ORAL | Status: DC | PRN

## 2015-08-01 MED ORDER — LORAZEPAM 1 MG PO TABS
1.0000 mg | ORAL_TABLET | Freq: Three times a day (TID) | ORAL | Status: DC | PRN
  Administered 2015-08-01 – 2015-08-02 (×3): 1 mg via ORAL
  Filled 2015-08-01 (×2): qty 1

## 2015-08-01 NOTE — Progress Notes (Signed)
D: Pt is alert and oriented x4. Pt complained of moderate depression of 6 on a 0-10 depression scale and severe anxiety of 6 on a 0-10 anxiety scale. Pt also complained of neck pain of 6 on a 0-10 pain scale. He states, "I am here to fix my problems and get my medications straight." Pt however denies SI/HI and AVH.  A: Medications offered as prescribed.  Support, encouragement, and safe environment provided.  15-minute safety checks continue.  R: Pt was med compliant.  Did not attend group. Safety checks continue.
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
## 2015-08-01 NOTE — BHH Group Notes (Signed)
B[facility] LCSW Group Therapy  08/01/2015 3:53 PM  Type of Therapy:  Group Therapy  Participation Level:  Active  Participation Quality:  Appropriate and Attentive  Affect:  Depressed  Cognitive:  Appropriate  Insight:  Developing/Improving  Engagement in Therapy:  Developing/Improving  Modes of Intervention:  Discussion, Exploration and Support   Emotion Regulation: This group focused on both positive and negative emotion identification and allowed group members to process ways to identify feelings, regulate negative emotions, and find healthy ways to manage internal/external emotions. Group members were asked to reflect on a time when their reaction to an emotion led to a negative outcome and explored how alternative responses using emotion regulation would have benefited them. Group members were also asked to discuss a time when emotion regulation was utilized when a negative emotion was experienced.  Summary of Progress/Problems:  Patient was able to process feelings of guilt and shame regarding interactions w family members which were angry.  Patient expresses regret that he has lost his temper w others in the past, finds it difficult to forgive himself despite knowing that others have "moved on."  Able to identify w others who also voiced concerns about their interactions w others and feelings of shame regarding being diagnosed w mental illness.    Sallee Lange 08/01/2015, 3:53 PM

## 2015-08-01 NOTE — Plan of Care (Signed)
Problem: Ineffective individual coping Goal: STG-Increase in ability to manage activities of daily living Outcome: Progressing Able to manage ADL's appropriately.

## 2015-08-01 NOTE — Progress Notes (Signed)
Patient attend N/A group tonight.  
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
## 2015-08-01 NOTE — Telephone Encounter (Signed)
Medication management - Left patient a message this nurse got his phone call with report of still having some problems with sleep and managing anger.  Requested patient call back to see is we could get him worked in to see Dr. Lolly Mustache earlier.   Patient reported he has periods of just rage and managing mood and states desire to have his medications adjusted or to speak with Dr. Lolly Mustache. Denies in any danger at this time but would like a call back.

## 2015-08-01 NOTE — Telephone Encounter (Signed)
Patient is admitted inpatient

## 2015-08-01 NOTE — Progress Notes (Signed)
D:  Patient presents with anxious, depressed mood.  D: Patient presents with anxious depressed mood.  He rates his depression and anxiety as a 9; hopelessness as an 8.  He continues to report poor sleep.  His goal today is to "get his medications right so I feel better."  Patient is attending groups.  He has minimal interaction with peers and staff.  Patient received his 3rd dose of vistaril today. A: Continue to monitor medication management and MD orders.  Safety checks completed every 15 minutes per protocol.  Offer support and encouragement as needed. R: Patient's behavior is appropriate to situation.

## 2015-08-01 NOTE — Progress Notes (Signed)
Recreation Therapy Notes  Date: 09.21.2016 Time: 9:30am Location: [address] Group Room   Group Topic: Stress Management  Goal Area(s) Addresses:  Patient will actively participate in stress management techniques presented during session.   Behavioral Response: Appropriate, Attentive  Intervention: Stress management techniques  Activity :  Deep Breathing and Guided Imagery. LRT provided instruction and demonstration on practice of Guided Imagery. Technique was coupled with deep breathing.   Education:  Stress Management, Discharge Planning.   Education Outcome: Acknowledges education  Clinical Observations/Feedback: Patient actively participated in technique introduced. Patient expressed no concerns and demonstrated ability to practice independently post d/c.    Marykay Lex Vonnie Ligman, LRT/CTRS         Satin Boal L 08/01/2015 2:50 PM

## 2015-08-01 NOTE — BHH Group Notes (Signed)
[facility] LCSW Aftercare Discharge Planning Group Note  08/01/2015 8:45 AM  Participation Quality: Alert, Appropriate and Oriented  Mood/Affect: Flat and Depressed  Depression Rating: 7  Anxiety Rating: "high"  Thoughts of Suicide: Pt denies SI/HI  Will you contract for safety? Yes  Current AVH: Pt denies  Plan for Discharge/Comments: Pt attended discharge planning group and actively participated in group. CSW discussed suicide prevention education with the group and encouraged them to discuss discharge planning and any relevant barriers. Pt reports feeling badly. Reports that he is seen by Dr. Lolly Mustache but no therapy services.  Transportation Means: Pt reports access to transportation  Supports: No supports mentioned at this time  Chad Cordial, LCSWA 08/01/2015 9:41 AM

## 2015-08-02 LAB — BASIC METABOLIC PANEL
ANION GAP: 8 (ref 5–15)
BUN: 13 mg/dL (ref 6–20)
CALCIUM: 9.7 mg/dL (ref 8.9–10.3)
CO2: 28 mmol/L (ref 22–32)
Chloride: 96 mmol/L — ABNORMAL LOW (ref 101–111)
Creatinine, Ser: 0.91 mg/dL (ref 0.61–1.24)
GFR calc non Af Amer: >60 mL/min (ref >60)
Glucose, Bld: 355 mg/dL — ABNORMAL HIGH (ref 65–99)
Potassium: 4.7 mmol/L (ref 3.5–5.1)
SODIUM: 132 mmol/L — AB (ref 135–145)

## 2015-08-02 LAB — GLUCOSE, CAPILLARY
GLUCOSE-CAPILLARY: 295 mg/dL — AB (ref 65–99)
GLUCOSE-CAPILLARY: 462 mg/dL — AB (ref 65–99)
GLUCOSE-CAPILLARY: 491 mg/dL — AB (ref 65–99)
Glucose-Capillary: 433 mg/dL — ABNORMAL HIGH (ref 65–99)
Glucose-Capillary: 217 mg/dL — ABNORMAL HIGH (ref 65–99)
Glucose-Capillary: 377 mg/dL — ABNORMAL HIGH (ref 65–99)

## 2015-08-02 MED ORDER — INSULIN ASPART 100 UNIT/ML ~~LOC~~ SOLN
20.0000 [IU] | Freq: Once | SUBCUTANEOUS | Status: AC
  Administered 2015-08-02: 20 [IU] via SUBCUTANEOUS

## 2015-08-02 MED ORDER — LORAZEPAM 1 MG PO TABS
1.0000 mg | ORAL_TABLET | Freq: Four times a day (QID) | ORAL | Status: DC | PRN
  Administered 2015-08-02 – 2015-08-06 (×11): 1 mg via ORAL
  Filled 2015-08-02 (×11): qty 1

## 2015-08-02 NOTE — Progress Notes (Signed)
D: Pt is alert and oriented x4. Pt complained of moderate depression of 5 on a 0-10 depression scale and severe anxiety of 6 on a 0-10 anxiety scale. Pt also complained of neck pain of 6 on a 0-10 pain scale. He states, "Am still about what I was yesterday but I can feel that am getting better." Pt however denies SI/HI and AVH.  A: Medications offered as prescribed.  Support, encouragement, and safe environment provided.  15-minute safety checks continue.  R: Pt was med compliant.  Pt attended wrap-up group. Safety checks continue.

## 2015-08-02 NOTE — Progress Notes (Signed)
[facility] MD Progress Note  08/02/2015 8:50 PM [name].  MRN:  [id number] Subjective:   Patient states he is feeling better , compared to how he was feeling prior to admission. He states he feels " clearer ", and not " like I was high on something " . Denies medication side effects. Objective : i have reviewed chart and met with patient. Patient remains  Somewhat depressed, constricted, and vaguely irritable, but is calm, polite, and has not exhibited any explosive or disruptive behaviors . Of note, he has had poorly controlled DM, which he admits may be contributing to some of his symptoms of feeling tired and fatigued . He has gone to some groups . Reports significant anxiety symptoms.  Principal Problem: Bipolar disorder with depression Diagnosis:   Patient Active Problem List   Diagnosis Date Noted  . Bipolar disorder with depression [F31.30] 07/31/2015  . Insomnia [G47.00]   . Sinusitis, acute maxillary [J01.00] 02/06/2015  . Headache syndrome [G44.89] 02/06/2015  . Atypical pneumonia [J18.9] 01/18/2015  . Renal mass [N28.89] 12/20/2014  . Lung nodule [R91.1] 12/07/2014  . Muscle spasms of neck [M62.48] 10/17/2014  . Bipolar I disorder, most recent episode depressed [F31.30]   . Alcohol dependence with alcohol-induced mood disorder [F10.24]   . MDD (major depressive disorder), severe [F32.2] 09/19/2014  . Hyponatremia [E87.1] 08/15/2014  . Abscess [L02.91] 08/15/2014  . Loss of weight [R63.4] 05/26/2014  . Leukocytosis, unspecified [D72.829] 05/26/2014  . Rash and nonspecific skin eruption [R21] 04/21/2014  . Unspecified episodic mood disorder [F39] 03/21/2014  . Alcohol dependence [F10.20] 03/21/2014  . Alcohol abuse [F10.10] 03/20/2014  . Cocaine abuse [F14.10] 03/20/2014  . Neck pain [M54.2] 01/31/2014  . Rotator cuff dysfunction [M75.100] 11/13/2013  . Closed fracture of unspecified phalanx or phalanges of hand [S62.609A] 10/04/2013  . Generalized anxiety disorder  [F41.1] 08/30/2013  . Anxiety disorder [F41.9] 08/29/2013  . Admitted with dehydration [Z78.9] 08/29/2013  . Gastroenteritis [K52.9] 08/29/2013  . Malnutrition of mild degree [E44.1] 08/29/2013  . Major depressive disorder, recurrent episode, severe [F33.2] 07/14/2013  . HYPOGONADISM [E29.1] 08/28/2009  . 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 Diabetes type 2, uncontrolled [E11.65] 09/30/2006  . HYPERLIPIDEMIA [E78.5] 09/30/2006  . TOBACCO ABUSE [Z72.0] 09/30/2006  . HYPERTENSION [I10] 09/30/2006  . ALLERGIC RHINITIS [J30.9] 09/30/2006  . COPD (chronic obstructive pulmonary disease) [J44.9] 09/30/2006  . GERD [K21.9] 09/30/2006  . 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 Backache [M54.9] 09/30/2006  . FIBROMYALGIA [M79.1, M60.9] 09/30/2006  . FATTY LIVER DISEASE, HX OF [Z87.19] 09/30/2006   Total Time spent with patient: 20 minutes   Past Medical History:  Past Medical History  Diagnosis Date  . Allergy   . Depression   . GERD (gastroesophageal reflux disease)   . Hyperlipidemia   . Chronic pain disorder     Sees [facility]  . Urinary incontinence     detrusor instability  . Hypogonadism   . Diabetes mellitus     Type II  . Chronic neck pain   . Bipolar depression   . Opiate dependence, continuous   . Polysubstance abuse   . Drug-seeking behavior     Past Surgical History  Procedure Laterality Date  . Appendectomy    . Hernia repair    . Nasal sinus surgery  2008   Family History:  Family History  Problem Relation Age of Onset  . Diabetes Mother   . Hypertension Mother   . Cirrhosis Mother   .  Depression Mother   . Bipolar disorder Mother   . Arthritis Father 2    osteoarthritis  . Bipolar disorder Father   . Diabetes Sister     borderline  . Heart disease Paternal Uncle 28    MI  . Heart disease Maternal Grandfather     late 60's--MI  . Cancer Paternal Grandmother 55    lung  . Bipolar disorder  Paternal Grandmother   . Heart disease Paternal Grandfather 51    MI  . Bipolar disorder Paternal Grandfather    Social History:  History  Alcohol Use No     History  Drug Use No    Social History   Social History  . Marital Status: Legally Separated    Spouse Name: N/A  . Number of Children: 2  . Years of Education: N/A   Occupational History  . Disabled     chronic pain   Social History Main Topics  . Smoking status: Current Every Day Smoker -- 2.50 packs/day    Types: Cigarettes  . Smokeless tobacco: Never Used  . Alcohol Use: No  . Drug Use: No  . Sexual Activity: Not Asked   Other Topics Concern  . None   Social History Narrative   Additional History:    Sleep: Fair  Appetite:  Fair   Assessment:   Musculoskeletal: Strength & Muscle Tone: within normal limits Gait & Station: normal Patient leans: N/A   Psychiatric Specialty Exam: Physical Exam  ROS no chest pain, no SOB, no vomiting   Blood pressure 132/89, pulse 116, temperature 98 F (36.7 C), temperature source Oral, resp. rate 18, height 6' 1"  (1.854 m), weight 209 lb (94.802 kg).Body mass index is 27.58 kg/(m^2).  General Appearance: Fairly Groomed  Engineer, water::  Good  Speech:  Normal Rate  Volume:  Normal  Mood:  Depressed and Dysphoric  Affect:  Congruent  Thought Process:  Linear  Orientation:  Full (Time, Place, and Person)  Thought Content:  Rumination and denies hallucinations, no delusions, not internally preoccupied   Suicidal Thoughts:  No at this time denies any thoughts of hurting self or of suicide  Homicidal Thoughts:  No  Memory:  recent and remote grossly intact   Judgement:  Fair  Insight:  Fair  Psychomotor Activity:  Normal  Concentration:  Good  Recall:  Good  Fund of Knowledge:Good  Language: Good  Akathisia:  Negative  Handed:  Right  AIMS (if indicated):     Assets:  Desire for Improvement Resilience Social Support  ADL's:   Fair   Cognition: WNL   Sleep:        Current Medications: Current Facility-Administered Medications  Medication Dose Route Frequency Provider Last Rate Last Dose  . albuterol (PROVENTIL HFA;VENTOLIN HFA) 108 (90 BASE) MCG/ACT inhaler 2 puff  2 puff Inhalation Q6H PRN Delfin Gant, NP      . aspirin EC tablet 81 mg  81 mg Oral Daily Delfin Gant, NP   81 mg at 08/02/15 0828  . DULoxetine (CYMBALTA) DR capsule 60 mg  60 mg Oral BID Delfin Gant, NP   60 mg at 08/02/15 1727  . fluticasone (FLONASE) 50 MCG/ACT nasal spray 1 spray  1 spray Each Nare Daily Delfin Gant, NP   1 spray at 08/01/15 0748  . gabapentin (NEURONTIN) capsule 800 mg  800 mg Oral TID Delfin Gant, NP   800 mg at 08/02/15 1727  . insulin aspart (novoLOG) injection 0-15  Units  0-15 Units Subcutaneous TID WC Delfin Gant, NP   5 Units at 08/02/15 1728  . insulin glargine (LANTUS) injection 30 Units  30 Units Subcutaneous Q2200 Delfin Gant, NP   30 Units at 07/31/15 2200  . lamoTRIgine (LAMICTAL) tablet 200 mg  200 mg Oral Daily Delfin Gant, NP   200 mg at 08/02/15 0829  . loratadine (CLARITIN) tablet 10 mg  10 mg Oral Daily Delfin Gant, NP   10 mg at 08/02/15 0828  . LORazepam (ATIVAN) tablet 1 mg  1 mg Oral Q6H PRN Jenne Campus, MD      . mirabegron ER (MYRBETRIQ) tablet 50 mg  50 mg Oral Daily Delfin Gant, NP   50 mg at 08/02/15 0828  . nicotine (NICODERM CQ - dosed in mg/24 hours) patch 21 mg  21 mg Transdermal Daily Myer Peer Cobos, MD   21 mg at 08/02/15 0830  . pioglitazone (ACTOS) tablet 30 mg  30 mg Oral Daily Delfin Gant, NP   30 mg at 08/02/15 4665  . ramipril (ALTACE) capsule 2.5 mg  2.5 mg Oral Daily Delfin Gant, NP   2.5 mg at 08/02/15 0828  . simvastatin (ZOCOR) tablet 10 mg  10 mg Oral QHS Delfin Gant, NP   10 mg at 08/01/15 2215  . sodium chloride tablet 1 g  1 g Oral BID WC Delfin Gant, NP   1 g at 08/02/15 1727    Lab Results:   Results for orders placed or performed during the hospital encounter of 07/31/15 (from the past 48 hour(s))  Glucose, capillary     Status: Abnormal   Collection Time: 08/01/15  6:20 AM  Result Value Ref Range   Glucose-Capillary 298 (H) 65 - 99 mg/dL   Comment 1 Notify RN   Glucose, capillary     Status: Abnormal   Collection Time: 08/01/15 12:03 PM  Result Value Ref Range   Glucose-Capillary 326 (H) 65 - 99 mg/dL   Comment 1 Notify RN    Comment 2 Document in Chart   Glucose, capillary     Status: Abnormal   Collection Time: 08/01/15  5:19 PM  Result Value Ref Range   Glucose-Capillary 350 (H) 65 - 99 mg/dL   Comment 1 Notify RN    Comment 2 Document in Chart   Glucose, capillary     Status: Abnormal   Collection Time: 08/01/15  9:12 PM  Result Value Ref Range   Glucose-Capillary 205 (H) 65 - 99 mg/dL  Glucose, capillary     Status: Abnormal   Collection Time: 08/02/15  6:11 AM  Result Value Ref Range   Glucose-Capillary 295 (H) 65 - 99 mg/dL  Basic metabolic panel     Status: Abnormal   Collection Time: 08/02/15  6:25 AM  Result Value Ref Range   Sodium 132 (L) 135 - 145 mmol/L   Potassium 4.7 3.5 - 5.1 mmol/L   Chloride 96 (L) 101 - 111 mmol/L   CO2 28 22 - 32 mmol/L   Glucose, Bld 355 (H) 65 - 99 mg/dL   BUN 13 6 - 20 mg/dL   Creatinine, Ser 0.91 0.61 - 1.24 mg/dL   Calcium 9.7 8.9 - 10.3 mg/dL   GFR calc non Af Amer >60 >60 mL/min   GFR calc Af Amer >60 >60 mL/min    Comment: (NOTE) The eGFR has been calculated using the CKD EPI equation. This calculation has not  been validated in all clinical situations. eGFR's persistently <60 mL/min signify possible Chronic Kidney Disease.    Anion gap 8 5 - 15    Comment: Performed at [facility]  Glucose, capillary     Status: Abnormal   Collection Time: 08/02/15 11:54 AM  Result Value Ref Range   Glucose-Capillary 462 (H) 65 - 99 mg/dL  Glucose, capillary     Status: Abnormal   Collection Time:  08/02/15 12:01 PM  Result Value Ref Range   Glucose-Capillary 433 (H) 65 - 99 mg/dL  Glucose, capillary     Status: Abnormal   Collection Time: 08/02/15  1:15 PM  Result Value Ref Range   Glucose-Capillary 491 (H) 65 - 99 mg/dL  Glucose, capillary     Status: Abnormal   Collection Time: 08/02/15  4:59 PM  Result Value Ref Range   Glucose-Capillary 217 (H) 65 - 99 mg/dL    Physical Findings: AIMS: Facial and Oral Movements Muscles of Facial Expression: None, normal Lips and Perioral Area: None, normal Jaw: None, normal Tongue: None, normal,Extremity Movements Upper (arms, wrists, hands, fingers): None, normal Lower (legs, knees, ankles, toes): None, normal, Trunk Movements Neck, shoulders, hips: None, normal, Overall Severity Severity of abnormal movements (highest score from questions above): None, normal Incapacitation due to abnormal movements: None, normal Patient's awareness of abnormal movements (rate only patient's report): No Awareness, Dental Status Current problems with teeth and/or dentures?: No Does patient usually wear dentures?: No  CIWA:    COWS:      Assessment - patient remains dysphoric, depressed, vaguely irritable and anxious. Behavior has been calm, in good control.  Denies medication side effects. He feels better than upon admission, states that he feels clearer headed, not confused, and is alert, attentive, and 0x3.   Treatment Plan Summary: Daily contact with patient to assess and evaluate symptoms and progress in treatment, Medication management, Plan inpatient treatment  and medications as below   Cymbalta 60 mgrs BID for depression Neurontin 800 mgrs TID for anxiety, pain Ativan 1 mgr Q 6 hours PRN for severe anxiety, as needed  Lamictal 200 mgrs QDAY for mood disorder  Actos/ insulin to manage DM- Diabetic Consult following    Medical Decision Making:  Established Problem, Stable/Improving (1), Review of Psycho-Social Stressors (1), Review or order  clinical lab tests (1) and Review of Medication Regimen & Side Effects (2)     COBOS, FERNANDO 08/02/2015, 8:50 PM

## 2015-08-02 NOTE — Tx Team (Signed)
Interdisciplinary Treatment Plan Update (Adult) Date: 08/02/2015   Date: 08/02/2015 2:22 PM  Progress in Treatment:  Attending groups: Yes  Participating in groups: Yes, minimally  Taking medication as prescribed: Yes  Tolerating medication: Yes  Family/Significant othe contact made: No, CSW attempting to make contact with family Patient understands diagnosis: Yes Discussing patient identified problems/goals with staff: Yes  Medical problems stabilized or resolved: Yes  Denies suicidal/homicidal ideation: Yes Patient has not harmed self or Others: Yes   New problem(s) identified: None identified at this time.   Discharge Plan or Barriers: Pt will return home and follow-up with outpatient resources  Additional comments: n/a   Reason for Continuation of Hospitalization:  Depression Aggression Medication stabilization Suicidal ideation  Estimated length of stay: 3-5 days  Review of initial/current patient goals per problem list:   1.  Goal(s): Patient will participate in aftercare plan  Met:  Yes  Target date: 3-5 days from date of admission   As evidenced by: Patient will participate within aftercare plan AEB aftercare provider and housing plan at discharge being identified.   08/02/15: Pt will return home and follow-up with outpatient resources  2.  Goal (s): Patient will exhibit decreased depressive symptoms and suicidal ideations.  Met:  Progressing  Target date: 3-5 days from date of admission   As evidenced by: Patient will utilize self rating of depression at 3 or below and demonstrate decreased signs of depression or be deemed stable for discharge by MD.  08/02/15: Pt rates depression at 5/10. Denies SI  Attendees:  Patient:    Family:    Physician: Dr. Parke Poisson, MD  08/02/2015 2:22 PM  Nursing: Lars Pinks, RN Case manager  08/02/2015 2:22 PM  Clinical Social Worker Peri Maris, Latanya Presser, MSW 08/02/2015 2:22 PM  Other: Lucinda Dell, Beverly Sessions Liasion 08/02/2015  2:22 PM  Clinical:  Vira Browns, RN 08/02/2015 2:22 PM  Other: , RN Charge Nurse 08/02/2015 2:22 PM  Other:     Peri Maris, [place] MSW

## 2015-08-02 NOTE — Progress Notes (Signed)
Adult Psychoeducational Group Note  Date:  08/02/2015 Time:  0900  Group Topic/Focus:  Orientation:   The focus of this group is to educate the patient on the purpose and policies of crisis stabilization and provide a format to answer questions about their admission.  The group details unit policies and expectations of patients while admitted.  Participation Level:  Active  Participation Quality:  Appropriate  Affect:  Appropriate  Cognitive:  Appropriate  Insight: Appropriate  Engagement in Group:  Engaged  Modes of Intervention:  Orientation  Additional Comments:  Enjoys bungy jumping   White, Patrice L 08/02/2015, 11:40 AM

## 2015-08-02 NOTE — Progress Notes (Signed)
[name] cont to have a hard time handling his anxiety. He was given 2 prn doses of ativan today and ahd releif shrotly after taking it, but lacks insight into other coping skills he can develop to help him with his anxiety. His cbg's were high: 1200 it was 433, he received 20 u novolog and rechecked at 1330 cbg was 491 so additional 20 units novolog was given.    A This nurse spoke with [name] with NP, who ordered diabetic consult as well as encourgaed to take his long acting insulin tonight, which he stated he woud.  Other wise, he has handled to day well. He completed his daily assessment and on it he wrote he deneid SI and he rated his depression, hopelessness and anxiety " 5/1/4", respectively.    R safety in place.

## 2015-08-02 NOTE — Progress Notes (Signed)
Adult Psychoeducational Group Note  Date:  08/02/2015 Time:  9:15 PM  Group Topic/Focus:  Wrap-Up Group:   The focus of this group is to help patients review their daily goal of treatment and discuss progress on daily workbooks.  Pt attended karaoke.   Cleotilde Neer 08/02/2015, 10:43 PM

## 2015-08-02 NOTE — Progress Notes (Signed)
D   Pt is depressed and somewhat irritable   He discussed his frustration with having to take insulin via a injection verses the pump   He is not compliant with dietary restrictions but is aware of the dietary restrictions    He denies suicidal and homicidal ideation   He continues to endorse depression and anxiety A    Verbal support given   Medications administered and effectiveness monitored   Reinforced dietary needs   Q 15 min checks   R   Pt safe at present and verbalized understanding

## 2015-08-02 NOTE — [facility] Group Notes (Addendum)
[facility] Mental Health Association Group Therapy 08/02/2015 1:15pm  Type of Therapy: Mental Health Association Presentation  Participation Level: Minimal  Participation Quality: Lethargic  Affect: Appropriate  Cognitive: Oriented  Insight: Developing/Improving  Engagement in Therapy: Limited  Modes of Intervention: Discussion, Education and Socialization  Summary of Progress/Problems: Pt came in and out of group multiple times; when in group, was observed to be sleeping.   Chad Cordial, LCSWA 08/02/2015 2:20 PM

## 2015-08-02 NOTE — Progress Notes (Signed)
Inpatient Diabetes Program Recommendations  AACE/ADA: New Consensus Statement on Inpatient Glycemic Control (2015)  Target Ranges:  Prepandial:   less than 140 mg/dL      Peak postprandial:   less than 180 mg/dL (1-2 hours)      Critically ill patients:  140 - 180 mg/dL   Review of Glycemic Control  Diabetes history: DM2 Outpatient Diabetes medications: V-Go Insulin Pump (max 56 units/day) Current orders for Inpatient glycemic control: Lantus 30 units QHS, Novolog moderate tidwc, Actos 30 mg QD  Results for Plake, Sabastion Arma Heading (MRN [id number]) as of 08/02/2015 13:43  Ref. Range 08/02/2015 06:25  Glucose Latest Ref Range: 65-99 mg/dL 782 (H)  Results for Cen, Josafat Arma Heading (MRN [id number]) as of 08/02/2015 13:43  Ref. Range 08/01/2015 21:12 08/02/2015 06:11 08/02/2015 11:54 08/02/2015 12:01 08/02/2015 13:15  Glucose-Capillary Latest Ref Range: 65-99 mg/dL 578 (H) 469 (H) 629 (H) 433 (H) 491 (H)   Previously on V-Go insulin pump, which is contraindicated at [facility].  Inpatient Diabetes Program Recommendations:  Insulin - Basal: Add Lantus 30 units QHS. Titrate if FBS > 180 mg/dL. Correction (SSI): Increase to Novolog resistant tidwc and hs Insulin - Meal Coverage: Add Novolog 4 units tidwc for meal coverage insulin. Do not give if pt eats < 50% meal HgbA1C: Need updated HgbA1C to assess glycemic control prior to hospitalization  Will follow with you. Thank you. Ailene Ards, RD, LDN, CDE Inpatient Diabetes Coordinator [phone]

## 2015-08-03 ENCOUNTER — Ambulatory Visit: Payer: Self-pay | Admitting: Endocrinology

## 2015-08-03 DIAGNOSIS — F411 Generalized anxiety disorder: Secondary | ICD-10-CM

## 2015-08-03 DIAGNOSIS — F333 Major depressive disorder, recurrent, severe with psychotic symptoms: Secondary | ICD-10-CM

## 2015-08-03 DIAGNOSIS — I10 Essential (primary) hypertension: Secondary | ICD-10-CM

## 2015-08-03 DIAGNOSIS — Z720 Tobacco use: Secondary | ICD-10-CM

## 2015-08-03 DIAGNOSIS — J449 Chronic obstructive pulmonary disease, unspecified: Secondary | ICD-10-CM

## 2015-08-03 LAB — GLUCOSE, CAPILLARY
GLUCOSE-CAPILLARY: 299 mg/dL — AB (ref 65–99)
GLUCOSE-CAPILLARY: 102 mg/dL — AB (ref 65–99)
Glucose-Capillary: 241 mg/dL — ABNORMAL HIGH (ref 65–99)
Glucose-Capillary: 421 mg/dL — ABNORMAL HIGH (ref 65–99)

## 2015-08-03 MED ORDER — INSULIN ASPART 100 UNIT/ML ~~LOC~~ SOLN
10.0000 [IU] | Freq: Three times a day (TID) | SUBCUTANEOUS | Status: DC
  Administered 2015-08-03 – 2015-08-06 (×8): 10 [IU] via SUBCUTANEOUS

## 2015-08-03 MED ORDER — INSULIN ASPART 100 UNIT/ML ~~LOC~~ SOLN: 0.0000 [IU] | Freq: Every day | SUBCUTANEOUS | Status: DC

## 2015-08-03 MED ORDER — LURASIDONE HCL 40 MG PO TABS
40.0000 mg | ORAL_TABLET | Freq: Every day | ORAL | Status: DC
  Administered 2015-08-03 – 2015-08-06 (×4): 40 mg via ORAL
  Filled 2015-08-03 (×6): qty 1

## 2015-08-03 MED ORDER — INSULIN GLARGINE 100 UNIT/ML ~~LOC~~ SOLN
35.0000 [IU] | Freq: Every day | SUBCUTANEOUS | Status: DC
  Administered 2015-08-03 – 2015-08-04 (×2): 35 [IU] via SUBCUTANEOUS

## 2015-08-03 MED ORDER — INSULIN ASPART 100 UNIT/ML ~~LOC~~ SOLN
25.0000 [IU] | Freq: Once | SUBCUTANEOUS | Status: AC
  Administered 2015-08-03: 25 [IU] via SUBCUTANEOUS

## 2015-08-03 MED ORDER — DULOXETINE HCL 60 MG PO CPEP
60.0000 mg | ORAL_CAPSULE | Freq: Every day | ORAL | Status: DC
  Administered 2015-08-04 – 2015-08-06 (×3): 60 mg via ORAL
  Filled 2015-08-03 (×4): qty 1

## 2015-08-03 MED ORDER — INSULIN ASPART 100 UNIT/ML ~~LOC~~ SOLN
0.0000 [IU] | Freq: Three times a day (TID) | SUBCUTANEOUS | Status: DC
  Administered 2015-08-04: 7 [IU] via SUBCUTANEOUS
  Administered 2015-08-04: 15 [IU] via SUBCUTANEOUS
  Administered 2015-08-04: 11 [IU] via SUBCUTANEOUS
  Administered 2015-08-05: 7 [IU] via SUBCUTANEOUS
  Administered 2015-08-05: 4 [IU] via SUBCUTANEOUS
  Administered 2015-08-05: 11 [IU] via SUBCUTANEOUS
  Administered 2015-08-06: 4 [IU] via SUBCUTANEOUS

## 2015-08-03 NOTE — Progress Notes (Addendum)
[name] has had a diff time today...dealing with his intense feelings of anxiety   And rage. Marland Kitchen He takes his medications as planned and he completes his daily assessment and on it he writes he denies SI and he rates his depression, hopelessness and anxiety   " " 6/4/6"  , respectively, .    AHe became agitated at lunch ( cbg 421) and was given a one time dose of insulin  25 units novolog  . Internal medicine ordered 35 units lantus given at 1330.  He is adamant that he " can get better" and says he will take his meicaitons " as ordered" .  CBG 299 at dinnertime and 18 units novolog given.   R Safety is in place and makintained.

## 2015-08-03 NOTE — [facility] Group Notes (Signed)
[facility] LCSW Group Therapy 08/03/2015 1:15pm  Type of Therapy: Group Therapy- Feelings Around Relapse and Recovery  Pt came to group but slept through discussion.   Chad Cordial, LCSWA [phone] 08/03/2015 5:00 PM

## 2015-08-03 NOTE — [facility] Suicide Risk Assessment (Signed)
[facility] INPATIENT:  Family/Significant Other Suicide Prevention Education  Suicide Prevention Education:  Contact Attempts: Candace (fiance) [phone] has been identified by the patient as the family member/significant other with whom the patient will be residing, and identified as the person(s) who will aid the patient in the event of a mental health crisis.  With written consent from the patient, two attempts were made to provide suicide prevention education, prior to and/or following the patient's discharge.  We were unsuccessful in providing suicide prevention education.  A suicide education pamphlet was given to the patient to share with family/significant other.  Date and time of first attempt: 08/03/15 at 11am. No answer, left message. Date and time of second attempt: 08/06/15 at 10:15am. Voicemail left.  Chad Cordial, LCSWA Clinical Social Work [phone]

## 2015-08-03 NOTE — Progress Notes (Addendum)
Patient ID: [name]., male   DOB: 08/04/1976, 39 y.o.   MRN: [id number] [facility] MD Progress Note  08/03/2015 12:47 PM [name].  MRN:  [id number] Subjective:  Today patient reports increased irritability, subjective feeling of anxiety and off being " very aggravated, very angry, wanting to blow up "   He cannot identify and stressors or external issues that are causing this increased irritability. He denies side effects from current medication regimen. Objective : I have reviewed chart , discussed case with treatment team, and met with patient. Patient is irritable, reporting worsening dysphoria and subjective sense of anger. His behavior , however, is in good control. He is not loud, threatening or physically agitated .  He denies medication side effects.  He has gone to some groups, and is visible on unit . Denies medication side effects, but at this time states " they do not seem to be working" Principal Problem: Bipolar disorder with depression Diagnosis:   Patient Active Problem List   Diagnosis Date Noted  . Bipolar disorder with depression [F31.30] 07/31/2015  . Insomnia [G47.00]   . Sinusitis, acute maxillary [J01.00] 02/06/2015  . Headache syndrome [G44.89] 02/06/2015  . Atypical pneumonia [J18.9] 01/18/2015  . Renal mass [N28.89] 12/20/2014  . Lung nodule [R91.1] 12/07/2014  . Muscle spasms of neck [M62.48] 10/17/2014  . Bipolar I disorder, most recent episode depressed [F31.30]   . Alcohol dependence with alcohol-induced mood disorder [F10.24]   . MDD (major depressive disorder), severe [F32.2] 09/19/2014  . Hyponatremia [E87.1] 08/15/2014  . Abscess [L02.91] 08/15/2014  . Loss of weight [R63.4] 05/26/2014  . Leukocytosis, unspecified [D72.829] 05/26/2014  . Rash and nonspecific skin eruption [R21] 04/21/2014  . Unspecified episodic mood disorder [F39] 03/21/2014  . Alcohol dependence [F10.20] 03/21/2014  . Alcohol abuse [F10.10] 03/20/2014  . Cocaine  abuse [F14.10] 03/20/2014  . Neck pain [M54.2] 01/31/2014  . Rotator cuff dysfunction [M75.100] 11/13/2013  . Closed fracture of unspecified phalanx or phalanges of hand [S62.609A] 10/04/2013  . Generalized anxiety disorder [F41.1] 08/30/2013  . Anxiety disorder [F41.9] 08/29/2013  . Admitted with dehydration [Z78.9] 08/29/2013  . Gastroenteritis [K52.9] 08/29/2013  . Malnutrition of mild degree [E44.1] 08/29/2013  . Major depressive disorder, recurrent episode, severe [F33.2] 07/14/2013  . HYPOGONADISM [E29.1] 08/28/2009  . 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 Diabetes type 2, uncontrolled [E11.65] 09/30/2006  . HYPERLIPIDEMIA [E78.5] 09/30/2006  . TOBACCO ABUSE [Z72.0] 09/30/2006  . HYPERTENSION [I10] 09/30/2006  . ALLERGIC RHINITIS [J30.9] 09/30/2006  . COPD (chronic obstructive pulmonary disease) [J44.9] 09/30/2006  . GERD [K21.9] 09/30/2006  . 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 Backache [M54.9] 09/30/2006  . FIBROMYALGIA [M79.1, M60.9] 09/30/2006  . FATTY LIVER DISEASE, HX OF [Z87.19] 09/30/2006   Total Time spent with patient: 20 minutes   Past Medical History:  Past Medical History  Diagnosis Date  . Allergy   . Depression   . GERD (gastroesophageal reflux disease)   . Hyperlipidemia   . Chronic pain disorder     Sees [facility]  . Urinary incontinence     detrusor instability  . Hypogonadism   . Diabetes mellitus     Type II  . Chronic neck pain   . Bipolar depression   . Opiate dependence, continuous   . Polysubstance abuse   . Drug-seeking behavior     Past Surgical History  Procedure Laterality Date  . Appendectomy    . Hernia repair    .  Nasal sinus surgery  2008   Family History:  Family History  Problem Relation Age of Onset  . Diabetes Mother   . Hypertension Mother   . Cirrhosis Mother   . Depression Mother   . Bipolar disorder Mother   . Arthritis Father 35    osteoarthritis  .  Bipolar disorder Father   . Diabetes Sister     borderline  . Heart disease Paternal Uncle 77    MI  . Heart disease Maternal Grandfather     late 60's--MI  . Cancer Paternal Grandmother 51    lung  . Bipolar disorder Paternal Grandmother   . Heart disease Paternal Grandfather 25    MI  . Bipolar disorder Paternal Grandfather    Social History:  History  Alcohol Use No     History  Drug Use No    Social History   Social History  . Marital Status: Legally Separated    Spouse Name: N/A  . Number of Children: 2  . Years of Education: N/A   Occupational History  . Disabled     chronic pain   Social History Main Topics  . Smoking status: Current Every Day Smoker -- 2.50 packs/day    Types: Cigarettes  . Smokeless tobacco: Never Used  . Alcohol Use: No  . Drug Use: No  . Sexual Activity: Not Asked   Other Topics Concern  . None   Social History Narrative   Additional History:    Sleep: Fair  Appetite:  Fair   Assessment:   Musculoskeletal: Strength & Muscle Tone: within normal limits Gait & Station: normal Patient leans: N/A   Psychiatric Specialty Exam: Physical Exam  ROS no chest pain, no SOB, no vomiting   Blood pressure 132/89, pulse 116, temperature 98 F (36.7 C), temperature source Oral, resp. rate 18, height 6' 1" (1.854 m), weight 209 lb (94.802 kg).Body mass index is 27.58 kg/(m^2).  General Appearance: Fairly Groomed  Engineer, water::  Good  Speech:  Normal Rate- not pressured   Volume:  Normal  Mood:  More dysphoric today  Affect:  Congruent  Thought Process:  Linear  Orientation:  Full (Time, Place, and Person)  Thought Content:   No hallucinations, no delusions, no grandiosity   Suicidal Thoughts:  No at this time denies any thoughts of hurting self or of suicide  Homicidal Thoughts:  No- although irritable, denies any HI or violent plans/intentions   Memory:  recent and remote grossly intact   Judgement:  Fair  Insight:  Fair   Psychomotor Activity:  Normal not  psychomotorically agitated   Concentration:  Good  Recall:  Good  Fund of Knowledge:Good  Language: Good  Akathisia:  Negative  Handed:  Right  AIMS (if indicated):     Assets:  Desire for Improvement Resilience Social Support  ADL's:   Fair   Cognition: WNL  Sleep:        Current Medications: Current Facility-Administered Medications  Medication Dose Route Frequency Provider Last Rate Last Dose  . albuterol (PROVENTIL HFA;VENTOLIN HFA) 108 (90 BASE) MCG/ACT inhaler 2 puff  2 puff Inhalation Q6H PRN Delfin Gant, NP      . aspirin EC tablet 81 mg  81 mg Oral Daily Delfin Gant, NP   81 mg at 08/03/15 2952  . [START ON 08/04/2015] DULoxetine (CYMBALTA) DR capsule 60 mg  60 mg Oral Daily Myer Peer Cobos, MD      . fluticasone (FLONASE) 50 MCG/ACT nasal  spray 1 spray  1 spray Each Nare Daily Delfin Gant, NP   1 spray at 08/01/15 0748  . gabapentin (NEURONTIN) capsule 800 mg  800 mg Oral TID Delfin Gant, NP   800 mg at 08/03/15 3875  . insulin aspart (novoLOG) injection 0-15 Units  0-15 Units Subcutaneous TID WC Delfin Gant, NP   8 Units at 08/03/15 [phone]  . insulin aspart (novoLOG) injection 10 Units  10 Units Subcutaneous TID WC Eugenie Filler, MD      . insulin glargine (LANTUS) injection 35 Units  35 Units Subcutaneous Daily Irine Seal V, MD      . lamoTRIgine (LAMICTAL) tablet 200 mg  200 mg Oral Daily Delfin Gant, NP   200 mg at 08/03/15 0813  . loratadine (CLARITIN) tablet 10 mg  10 mg Oral Daily Delfin Gant, NP   10 mg at 08/03/15 0800  . LORazepam (ATIVAN) tablet 1 mg  1 mg Oral Q6H PRN Jenne Campus, MD   1 mg at 08/03/15 1214  . lurasidone (LATUDA) tablet 40 mg  40 mg Oral Q breakfast Myer Peer Cobos, MD      . mirabegron ER (MYRBETRIQ) tablet 50 mg  50 mg Oral Daily Delfin Gant, NP   50 mg at 08/03/15 2951  . nicotine (NICODERM CQ - dosed in mg/24 hours) patch 21 mg  21 mg  Transdermal Daily Jenne Campus, MD   21 mg at 08/03/15 0813  . pioglitazone (ACTOS) tablet 30 mg  30 mg Oral Daily Delfin Gant, NP   30 mg at 08/03/15 8841  . ramipril (ALTACE) capsule 2.5 mg  2.5 mg Oral Daily Delfin Gant, NP   2.5 mg at 08/03/15 0813  . simvastatin (ZOCOR) tablet 10 mg  10 mg Oral QHS Delfin Gant, NP   10 mg at 08/02/15 2224  . sodium chloride tablet 1 g  1 g Oral BID WC Delfin Gant, NP   1 g at 08/03/15 6606    Lab Results:  Results for orders placed or performed during the hospital encounter of 07/31/15 (from the past 48 hour(s))  Glucose, capillary     Status: Abnormal   Collection Time: 08/01/15  5:19 PM  Result Value Ref Range   Glucose-Capillary 350 (H) 65 - 99 mg/dL   Comment 1 Notify RN    Comment 2 Document in Chart   Glucose, capillary     Status: Abnormal   Collection Time: 08/01/15  9:12 PM  Result Value Ref Range   Glucose-Capillary 205 (H) 65 - 99 mg/dL  Glucose, capillary     Status: Abnormal   Collection Time: 08/02/15  6:11 AM  Result Value Ref Range   Glucose-Capillary 295 (H) 65 - 99 mg/dL  Basic metabolic panel     Status: Abnormal   Collection Time: 08/02/15  6:25 AM  Result Value Ref Range   Sodium 132 (L) 135 - 145 mmol/L   Potassium 4.7 3.5 - 5.1 mmol/L   Chloride 96 (L) 101 - 111 mmol/L   CO2 28 22 - 32 mmol/L   Glucose, Bld 355 (H) 65 - 99 mg/dL   BUN 13 6 - 20 mg/dL   Creatinine, Ser 0.91 0.61 - 1.24 mg/dL   Calcium 9.7 8.9 - 10.3 mg/dL   GFR calc non Af Amer >60 >60 mL/min   GFR calc Af Amer >60 >60 mL/min    Comment: (NOTE) The eGFR has been  calculated using the CKD EPI equation. This calculation has not been validated in all clinical situations. eGFR's persistently <60 mL/min signify possible Chronic Kidney Disease.    Anion gap 8 5 - 15    Comment: Performed at [facility]  Glucose, capillary     Status: Abnormal   Collection Time: 08/02/15 11:54 AM  Result Value Ref  Range   Glucose-Capillary 462 (H) 65 - 99 mg/dL  Glucose, capillary     Status: Abnormal   Collection Time: 08/02/15 12:01 PM  Result Value Ref Range   Glucose-Capillary 433 (H) 65 - 99 mg/dL  Glucose, capillary     Status: Abnormal   Collection Time: 08/02/15  1:15 PM  Result Value Ref Range   Glucose-Capillary 491 (H) 65 - 99 mg/dL  Glucose, capillary     Status: Abnormal   Collection Time: 08/02/15  4:59 PM  Result Value Ref Range   Glucose-Capillary 217 (H) 65 - 99 mg/dL  Glucose, capillary     Status: Abnormal   Collection Time: 08/02/15  9:50 PM  Result Value Ref Range   Glucose-Capillary 377 (H) 65 - 99 mg/dL  Glucose, capillary     Status: Abnormal   Collection Time: 08/03/15  6:07 AM  Result Value Ref Range   Glucose-Capillary 241 (H) 65 - 99 mg/dL  Glucose, capillary     Status: Abnormal   Collection Time: 08/03/15 11:55 AM  Result Value Ref Range   Glucose-Capillary 421 (H) 65 - 99 mg/dL    Physical Findings: AIMS: Facial and Oral Movements Muscles of Facial Expression: None, normal Lips and Perioral Area: None, normal Jaw: None, normal Tongue: None, normal,Extremity Movements Upper (arms, wrists, hands, fingers): None, normal Lower (legs, knees, ankles, toes): None, normal, Trunk Movements Neck, shoulders, hips: None, normal, Overall Severity Severity of abnormal movements (highest score from questions above): None, normal Incapacitation due to abnormal movements: None, normal Patient's awareness of abnormal movements (rate only patient's report): No Awareness, Dental Status Current problems with teeth and/or dentures?: No Does patient usually wear dentures?: No  CIWA:    COWS:      Assessment - patient  Reports worsening irritability and dysphoria for no apparent external reason or stressor that he can identify. He remains depressed and at this time his presentation is suggestive of a mixed  Bipolar episode. He is not suicidal or homicidal , but is ruminative  and quite anxious . Continues to have significant hyperglycemia . Appreciate  Diabetes Coordinator involvement in optimizing diabetic management . Due to the above worsening presentation, will initiate taper of Cymbalta, as antidepressant may be contributing to mood dysregulation, swings . We discussed options - Deapkote an option but decided against it as he is on Lamictal at high dose . Zyprexa/seroquel could help but considered to have higher risk of weight gain and negative impact on his DM. Agrees to [employer] trial. .   Treatment Plan Summary: Daily contact with patient to assess and evaluate symptoms and progress in treatment, Medication management, Plan inpatient treatment  and medications as below   Decrease Cymbalta to 60 mgrs  QDAY , to minimize increased mood swings/ instability as a result of  antidepressant use  Continue Neurontin 800 mgrs TID for anxiety, pain Continue Ativan 1 mgr Q 6 hours PRN for severe anxiety, as needed  Continue Lamictal 200 mgrs QDAY for mood disorder  Start Latuda 40 mgrs QDAY for mood disorder Continue Actos/ insulin to manage DM- Diabetic Consult following  I  have co/nsulted Hospitalist Service for assistance regarding Diabetic Management, due to ongoing difficulties with  glycemic control. LABS- Obtain EKG,   Lipid panel , HgbA1C  , TSH     Medical Decision Making:  Established Problem, Stable/Improving (1), Review of Psycho-Social Stressors (1), Review or order clinical lab tests (1) and Review of Medication Regimen & Side Effects (2)     COBOS, FERNANDO 08/03/2015, 12:47 PM

## 2015-08-03 NOTE — Progress Notes (Signed)
Adult Psychoeducational Group Note  Date:  08/03/2015 Time:  9:38 PM  Group Topic/Focus:  Wrap-Up Group:   The focus of this group is to help patients review their daily goal of treatment and discuss progress on daily workbooks.  Participation Level:  Active  Participation Quality:  Appropriate and Attentive  Affect:  Appropriate  Cognitive:  Appropriate  Insight: Appropriate and Good  Engagement in Group:  Engaged  Modes of Intervention:  Education  Additional Comments:  Pt relapse prevention goal is to stay on medication and go to counseling. Pt goal when leaving here is to go back to work.   Merlinda Frederick 08/03/2015, 9:38 PM

## 2015-08-03 NOTE — Progress Notes (Signed)
Inpatient Diabetes Program Recommendations  AACE/ADA: New Consensus Statement on Inpatient Glycemic Control (2015)  Target Ranges:  Prepandial:   less than 140 mg/dL      Peak postprandial:   less than 180 mg/dL (1-2 hours)      Critically ill patients:  140 - 180 mg/dL   Review of Glycemic Control Results for ALGER, KERSTEIN (MRN [id number]) as of 08/03/2015 11:19  Ref. Range 08/02/2015 12:01 08/02/2015 13:15 08/02/2015 16:59 08/02/2015 21:50 08/03/2015 06:07  Glucose-Capillary Latest Ref Range: 65-99 mg/dL 409 (H) 811 (H) 914 (H) 377 (H) 241 (H)  Needs insulin adjustment, although glucose control has improved.  Recommendations: Increase Lantus to 35 units QHS Increase Novolog to 10 units tidwc. Awaiting HgbA1C results.  Will continue to follow.  Thank you. Ailene Ards, RD, LDN, CDE Inpatient Diabetes Coordinator [phone]

## 2015-08-03 NOTE — Consult Note (Signed)
[facility] Medical Consultation  [name]. BLT:[id number] DOB: 08/06/1976 DOA: 07/31/2015 PCP: No primary care provider on file.   Requesting physician: Dr. Sindy Messing Date of consultation: 08/03/2015 Reason for consultation: Management of diabetes  Impression/Recommendations Principal Problem:   Bipolar disorder with depression Active Problems:   Diabetes type 2, uncontrolled   TOBACCO ABUSE   Essential hypertension   COPD (chronic obstructive pulmonary disease)   GERD   Major depressive disorder, recurrent episode, severe   Anxiety disorder   Generalized anxiety disorder   Alcohol dependence with alcohol-induced mood disorder   #1 uncontrolled type 2 diabetes Last hemoglobin A1c was 9.2 on 02/06/2015. Patient's blood sugars have ranged from 217 -491. Patient's poorly controlled blood sugars might in part be secondary to current diet. Hemoglobin A1c is pending. Basic metabolic profile from 00/76/2263 with a sodium of 132 chloride of 96 otherwise within normal limits. Creatinine was 0.91. Will increase Lantus to 35 units daily. Add 10 units NovoLog meal coverage. Will change sliding scale from moderate to resistant scale. Continue Actos. Diabetic coordinator also following. Patient will need to follow-up with his endocrinologist on discharge and patient states has an appointment on 08/10/2015.  #2 major depressive disorder/bipolar disorder Per primary team.  #3 tobacco abuse Tobacco cessation. Nicotine patch.  #4 COPD Stable.  #5 hyperlipidemia Agree with checking fasting lipid panel. Continue home dose Zocor.  #6 hypertension Continue ACE inhibitor.   I will followup again tomorrow. Please contact me if I can be of assistance in the meanwhile. Thank you for this consultation.  Chief Complaint: Racing thoughts  HPI:  Patient is a 39 year old [name] with history of major depressive disorder with recurrent episodes, bipolar disorder, hyperlipidemia, type  2 diabetes on insulin pump at home and oral Actos, hypogonadism, chronic neck pain, prior history of polysubstance abuse who [facility] for major depressive disorder and bipolar disorder. Patient states he is on insulin pump at home as well as Actos. Patient denies any fevers, no chills, no nausea, no vomiting, no abdominal pain, no diarrhea, no constipation, no dysuria, no weakness, no cough, no melanotic, no hematemesis, no hematochezia. Patient was noted to have blood sugars ranging from 217-491. [facility] were called to help manage patient's DM.  Review of Systems:  Per HPI o/w negative.  Past Medical History  Diagnosis Date  . Allergy   . Depression   . GERD (gastroesophageal reflux disease)   . Hyperlipidemia   . Chronic pain disorder     Sees [name]  . Urinary incontinence     detrusor instability  . Hypogonadism   . Diabetes mellitus     Type II  . Chronic neck pain   . Bipolar depression   . Opiate dependence, continuous   . Polysubstance abuse   . Drug-seeking behavior    Past Surgical History  Procedure Laterality Date  . Appendectomy    . Hernia repair    . Nasal sinus surgery  2008   Social History:  reports that he has been smoking Cigarettes.  He has been smoking about 2.50 packs per day. He has never used smokeless tobacco. He reports that he does not drink alcohol or use illicit drugs.  Allergies  Allergen Reactions  . Ciprofloxacin Hives   Family History  Problem Relation Age of Onset  . Diabetes Mother   . Hypertension Mother   . Cirrhosis Mother   . Depression Mother   . Bipolar disorder Mother   .  Arthritis Father 16    osteoarthritis  . Bipolar disorder Father   . Diabetes Sister     borderline  . Heart disease Paternal Uncle 13    MI  . Heart disease Maternal Grandfather     late 60's--MI  . Cancer Paternal Grandmother 39    lung  . Bipolar disorder Paternal Grandmother   .  Heart disease Paternal Grandfather 20    MI  . Bipolar disorder Paternal Grandfather     Prior to Admission medications   Medication Sig Start Date End Date Taking? Authorizing Provider  aspirin EC 81 MG tablet Take 81 mg by mouth daily.    Historical Provider, MD  azelastine (ASTELIN) 0.1 % nasal spray Place 2 sprays into both nostrils 2 (two) times daily. Use in each nostril as directed Patient not taking: Reported on 9/Timothy/2016 09/27/14   Niel Hummer, NP  cetirizine (ZYRTEC) 10 MG tablet Take 1 tablet (10 mg total) by mouth daily. For allergies 03/23/14   Encarnacion Slates, NP  CIALIS 5 MG tablet Take 5 mg by mouth daily as needed. Erectile dysfunction. 07/27/15   Historical Provider, MD  DULoxetine (CYMBALTA) 60 MG capsule Take 1 capsule (60 mg total) by mouth 2 (two) times daily. 05/07/15   Kathlee Nations, MD  fluticasone (FLONASE) 50 MCG/ACT nasal spray PLACE 2 SPRAYS INTO BOTH NOSTRILS DAILY. 04/16/15   Percell Miller Saguier, PA-C  Fluticasone Furoate-Vilanterol (BREO ELLIPTA) 100-25 MCG/INH AEPB Inhale 1 puff into the lungs daily.    Historical Provider, MD  gabapentin (NEURONTIN) 800 MG tablet Take 800 mg by mouth 3 (three) times daily.    Historical Provider, MD  HUMALOG 100 UNIT/ML injection USE AS DIRECTED IN INSULIN PUMP *MAX 56 UNITS PER DAY 07/23/15   Elayne Snare, MD  hydrOXYzine (VISTARIL) 50 MG capsule Take 50 mg by mouth 3 (three) times daily as needed (for anxiety).    Historical Provider, MD  Insulin Disposable Pump (V-GO 20) KIT USE ONE PER DAY, NO FURTHER REFILLS WILL BE GIVEN UNTIL HE'S SEEN IN THE OFFICE 07/10/15   Elayne Snare, MD  Insulin Glargine (LANTUS SOLOSTAR) 100 UNIT/ML Solostar Pen Inject 30 Units into the skin at bedtime. Patient not taking: Reported on 02/21/2015 02/14/15   Elayne Snare, MD  ipratropium-albuterol (DUONEB) 0.5-2.5 (3) MG/3ML SOLN Take 3 mLs by nebulization every 4 (four) hours as needed (for wheezing/shortness of breath).    Historical Provider, MD  lamoTRIgine  (LAMICTAL) 200 MG tablet Take 1 tablet (200 mg total) by mouth daily. Take in the morning for mood control. 05/07/15   Kathlee Nations, MD  mirabegron ER (MYRBETRIQ) 50 MG TB24 tablet Take 50 mg by mouth daily.    Historical Provider, MD  pioglitazone (ACTOS) 30 MG tablet TAKE 1 TABLET (30 MG TOTAL) BY MOUTH DAILY. 07/20/15   Elayne Snare, MD  QUEtiapine (SEROQUEL) 50 MG tablet Take 1 tablet (50 mg total) by mouth at bedtime. 07/19/15 07/18/16  Kathlee Nations, MD  ramipril (ALTACE) 2.5 MG capsule Take 2.5 mg by mouth daily.    Historical Provider, MD  simvastatin (ZOCOR) 10 MG tablet Take 10 mg by mouth at bedtime.    Historical Provider, MD  SUMAtriptan (IMITREX) 25 MG tablet Take 25 mg by mouth every 2 (two) hours as needed for migraine. May repeat in 2 hours if headache persists or recurs.    Historical Provider, MD  Testosterone 75 MG PLLT Inject 150 mg into the skin every 3 (three) months.  09/15/14   Historical Provider, MD  tiZANidine (ZANAFLEX) 2 MG tablet Take 2-4 mg by mouth every 8 (eight) hours as needed. Muscle spasms 05/03/15   Historical Provider, MD   Physical Exam: Blood pressure 132/87, pulse 97, temperature 97.6 F (36.4 C), temperature source Oral, resp. rate 16, height 6' 1"  (1.854 m), weight 94.802 kg (209 lb). Filed Vitals:   08/03/15 0606  BP: 132/87  Pulse: 97  Temp:   Resp:      General:  Well developed, well nourished in no acute cardiopulmonary distress.  Eyes: Pupils equal round and reactive to light and accommodation. Extra movements intact.  ENT: Oropharynx is clear, no lesions, no exudates.  Neck: Supple with no lymphadenopathy.  Cardiovascular: Regular rate rhythm no murmurs rubs or gallops  Respiratory: Clear to auscultation bilaterally no wheezes no crackles or rhonchi  Abdomen: Soft, nontender, nondistended, positive bowel sounds  Skin: No rashes or lesions.  Musculoskeletal: 5/5 bilateral upper extremity strength. 5/5 bilateral lower extremity  strength.  Psychiatric: Speech is normal. Depressed mood. Congruent affect. Fair judgment. Fair insight.  Neurologic: Alert and oriented 3. Cranial nerves II through XII grossly intact. No focal deficits.  Labs on Admission:  Basic Metabolic Panel:  Recent Labs Lab 09/Timothy/16 2201 08/02/15 0625  NA 131* 132*  K 3.8 4.7  CL 95* 96*  CO2 28 28  GLUCOSE 376* 355*  BUN 9 13  CREATININE 0.83 0.91  CALCIUM 9.5 9.7   Liver Function Tests:  Recent Labs Lab 09/Timothy/16 2201  AST 20  ALT 16*  ALKPHOS 87  BILITOT 0.5  PROT 7.1  ALBUMIN 4.1   No results for input(s): LIPASE, AMYLASE in the last 168 hours. No results for input(s): AMMONIA in the last 168 hours. CBC:  Recent Labs Lab 09/Timothy/16 2201  WBC 9.6  HGB 12.9*  HCT 38.0*  MCV 87.6  PLT 235   Cardiac Enzymes: No results for input(s): CKTOTAL, CKMB, CKMBINDEX, TROPONINI in the last 168 hours. BNP: Invalid input(s): POCBNP CBG:  Recent Labs Lab 08/02/15 1659 08/02/15 2150 08/03/15 0607 08/03/15 1155 08/03/15 1655  GLUCAP 217* 377* 241* 421* 299*    Radiological Exams on Admission: No results found.  EKG: None  Time spent: [place] MD [facility] Pager [phone]  If 7PM-7AM, please contact night-coverage [email or web address] Password [facility] 08/03/2015, 5:30 PM

## 2015-08-03 NOTE — Progress Notes (Signed)
Pt declines referral for therapy services; states he will "talk to Dr. Lolly Mustache" at his next medication management appointment.  Chad Cordial, LCSWA Clinical Social Work [phone]

## 2015-08-03 NOTE — Progress Notes (Signed)
D   Pt is depressed and somewhat irritable   Pt reports better mood today     He denies suicidal and homicidal ideation   He continues to endorse depression and anxiety   He interacts well with others and is appropriate A    Verbal support given   Medications administered and effectiveness monitored   Reinforced dietary needs   Q 15 min checks   R   Pt safe at present and verbalized understanding

## 2015-08-03 NOTE — Progress Notes (Signed)
Adult Psychoeducational Group Note  Date:  08/03/2015 Time:  6:58 PM  Group Topic/Focus:  Relapse Prevention Planning:   The focus of this group is to define relapse and discuss the need for planning to combat relapse.  Participation Level:  Active  Participation Quality:  Attentive  Affect:  Appropriate  Cognitive:  Appropriate  Insight: Improving     Additional Comments:  Pt did participate in group discussion.  Pt states that he is frustrated because he is not feeling any better and believes that he needs to have his medication evaluated.  Pt states that his blood sugar is higher since he's been on the unit but doesn't understand why.  Pt states that he woke up in a bad mood today.  [name] 08/03/2015, 6:58 PM

## 2015-08-03 NOTE — Progress Notes (Signed)
Recreation Therapy Notes  Date: 09.23.2016 Time: 9:30am Location: [address] Group room   Group Topic: Stress Management  Goal Area(s) Addresses:  Patient will actively participate in stress management techniques presented during session.   Behavioral Response: Did not attend.   Marykay Lex Blanchfield, LRT/CTRS        Blanchfield, Denise L 08/03/2015 1:52 PM

## 2015-08-04 LAB — GLUCOSE, CAPILLARY
GLUCOSE-CAPILLARY: 259 mg/dL — AB (ref 65–99)
GLUCOSE-CAPILLARY: 224 mg/dL — AB (ref 65–99)
Glucose-Capillary: 328 mg/dL — ABNORMAL HIGH (ref 65–99)
Glucose-Capillary: 184 mg/dL — ABNORMAL HIGH (ref 65–99)

## 2015-08-04 LAB — HEMOGLOBIN A1C
Hgb A1c MFr Bld: 9.4 % — ABNORMAL HIGH (ref 4.8–5.6)
Mean Plasma Glucose: 223 mg/dL

## 2015-08-04 LAB — LIPID PANEL
CHOLESTEROL: 193 mg/dL (ref 0–200)
HDL: 59 mg/dL (ref >40)
LDL CALC: 111 mg/dL — AB (ref 0–99)
Total CHOL/HDL Ratio: 3.3 RATIO
Triglycerides: 117 mg/dL (ref <150)
VLDL: 23 mg/dL (ref 0–40)

## 2015-08-04 LAB — BASIC METABOLIC PANEL
ANION GAP: 9 (ref 5–15)
BUN: 10 mg/dL (ref 6–20)
CHLORIDE: 98 mmol/L — AB (ref 101–111)
CO2: 28 mmol/L (ref 22–32)
Calcium: 9.7 mg/dL (ref 8.9–10.3)
Creatinine, Ser: 0.95 mg/dL (ref 0.61–1.24)
GFR calc non Af Amer: >60 mL/min (ref >60)
Glucose, Bld: 262 mg/dL — ABNORMAL HIGH (ref 65–99)
POTASSIUM: 4.5 mmol/L (ref 3.5–5.1)
SODIUM: 135 mmol/L (ref 135–145)

## 2015-08-04 LAB — TSH: TSH: 1.942 u[IU]/mL (ref 0.350–4.500)

## 2015-08-04 MED ORDER — INSULIN GLARGINE 100 UNIT/ML ~~LOC~~ SOLN
38.0000 [IU] | Freq: Every day | SUBCUTANEOUS | Status: DC
  Administered 2015-08-05: 38 [IU] via SUBCUTANEOUS

## 2015-08-04 NOTE — Progress Notes (Signed)
Patient ID: [name]., male   DOB: May 12, 1976, 39 y.o.   MRN: [id number] [facility] MD Progress Note  08/04/2015 6:55 PM [name].   MRN:  [id number]   Subjective:  Pt states: "I'm doing a lot better today. I feel like I'm ready to go soon if I can. My medications are great at this point. I am used to using an insulin pump and that would help my sugars but the internal med doc is coming to see me daily."  Objective: Pt seen and chart reviewed. Pt is alert/oriented x4, calm, cooperative, and appropriate to situation during assessment. Pt denies suicidal/homicidal ideation and psychosis and does not appear to be responding to internal stimuli. Pt reports great improvement with medication management and that he is participating in groups at this time. Cites good sleep and good appetite. Pt reports that his most important improvement is in his ability to manage his anger and feeling as though he will lose his temper over small things. [name] wife is present and reports that she feels he is improving as well. Pt is optimistic about outpatient followup.   Principal Problem: Bipolar disorder with depression Diagnosis:   Patient Active Problem List   Diagnosis Date Noted  . Bipolar disorder with depression [F31.30] 07/31/2015  . Insomnia [G47.00]   . Sinusitis, acute maxillary [J01.00] 02/06/2015  . Headache syndrome [G44.89] 02/06/2015  . Atypical pneumonia [J18.9] 01/18/2015  . Renal mass [N28.89] 12/20/2014  . Lung nodule [R91.1] 12/07/2014  . Muscle spasms of neck [M62.48] 10/17/2014  . Bipolar I disorder, most recent episode depressed [F31.30]   . Alcohol dependence with alcohol-induced mood disorder [F10.24]   . MDD (major depressive disorder), severe [F32.2] 09/19/2014  . Hyponatremia [E87.1] 08/15/2014  . Abscess [L02.91] 08/15/2014  . Loss of weight [R63.4] 05/26/2014  . Leukocytosis, unspecified [D72.829] 05/26/2014  . Rash and nonspecific skin eruption [R21] 04/21/2014  .  Unspecified episodic mood disorder [F39] 03/21/2014  . Alcohol dependence [F10.20] 03/21/2014  . Alcohol abuse [F10.10] 03/20/2014  . Cocaine abuse [F14.10] 03/20/2014  . Neck pain [M54.2] 01/31/2014  . Rotator cuff dysfunction [M75.100] 11/13/2013  . Closed fracture of unspecified phalanx or phalanges of hand [S62.609A] 10/04/2013  . Generalized anxiety disorder [F41.1] 08/30/2013  . Anxiety disorder [F41.9] 08/29/2013  . Admitted with dehydration [Z78.9] 08/29/2013  . Gastroenteritis [K52.9] 08/29/2013  . Malnutrition of mild degree [E44.1] 08/29/2013  . Major depressive disorder, recurrent episode, severe [F33.2] 07/14/2013  . HYPOGONADISM [E29.1] 08/28/2009  . 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 Diabetes type 2, uncontrolled [E11.65] 09/30/2006  . HYPERLIPIDEMIA [E78.5] 09/30/2006  . TOBACCO ABUSE [Z72.0] 09/30/2006  . Essential hypertension [I10] 09/30/2006  . ALLERGIC RHINITIS [J30.9] 09/30/2006  . COPD (chronic obstructive pulmonary disease) [J44.9] 09/30/2006  . GERD [K21.9] 09/30/2006  . 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 Backache [M54.9] 09/30/2006  . FIBROMYALGIA [M79.1, M60.9] 09/30/2006  . FATTY LIVER DISEASE, HX OF [Z87.19] 09/30/2006   Total Time spent with patient: 15 minutes   Past Medical History:  Past Medical History  Diagnosis Date  . Allergy   . Depression   . GERD (gastroesophageal reflux disease)   . Hyperlipidemia   . Chronic pain disorder     Sees [facility]  . Urinary incontinence     detrusor instability  . Hypogonadism   . Diabetes mellitus     Type II  . Chronic neck pain   . Bipolar depression   .  Opiate dependence, continuous   . Polysubstance abuse   . Drug-seeking behavior     Past Surgical History  Procedure Laterality Date  . Appendectomy    . Hernia repair    . Nasal sinus surgery  2008   Family History:  Family History  Problem Relation Age of Onset  . Diabetes  Mother   . Hypertension Mother   . Cirrhosis Mother   . Depression Mother   . Bipolar disorder Mother   . Arthritis Father 47    osteoarthritis  . Bipolar disorder Father   . Diabetes Sister     borderline  . Heart disease Paternal Uncle 73    MI  . Heart disease Maternal Grandfather     late 60's--MI  . Cancer Paternal Grandmother 11    lung  . Bipolar disorder Paternal Grandmother   . Heart disease Paternal Grandfather 45    MI  . Bipolar disorder Paternal Grandfather    Social History:  History  Alcohol Use No     History  Drug Use No    Social History   Social History  . Marital Status: Legally Separated    Spouse Name: N/A  . Number of Children: 2  . Years of Education: N/A   Occupational History  . Disabled     chronic pain   Social History Main Topics  . Smoking status: Current Every Day Smoker -- 2.50 packs/day    Types: Cigarettes  . Smokeless tobacco: Never Used  . Alcohol Use: No  . Drug Use: No  . Sexual Activity: Not Asked   Other Topics Concern  . None   Social History Narrative   Additional History:    Sleep: Fair  Appetite:  Fair   Assessment:   Musculoskeletal: Strength & Muscle Tone: within normal limits Gait & Station: normal Patient leans: N/A   Psychiatric Specialty Exam: Physical Exam  Review of Systems  Psychiatric/Behavioral: Positive for depression. Negative for suicidal ideas. The patient is nervous/anxious. The patient does not have insomnia.   All other systems reviewed and are negative.  no chest pain, no SOB, no vomiting   Blood pressure 146/82, pulse 94, temperature 97.9 F (36.6 C), temperature source Oral, resp. rate 18, height 6' 1" (1.854 m), weight 94.802 kg (209 lb).Body mass index is 27.58 kg/(m^2).  General Appearance: Fairly Groomed  Engineer, water::  Good  Speech:  Normal Rate  Volume:  Normal  Mood:  Euthymic  Affect:  Appropriate and Congruent  Thought Process:  Linear  Orientation:  Full (Time,  Place, and Person)  Thought Content:   No hallucinations, no delusions, no grandiosity   Suicidal Thoughts:  No at this time denies any thoughts of hurting self or of suicide  Homicidal Thoughts:  No  Memory:  recent and remote grossly intact   Judgement:  Fair  Insight:  Fair  Psychomotor Activity:  Normal not  psychomotorically agitated   Concentration:  Good  Recall:  Good  Fund of Knowledge:Good  Language: Good  Akathisia:  Negative  Handed:  Right  AIMS (if indicated):     Assets:  Desire for Improvement Resilience Social Support  ADL's:   Fair   Cognition: WNL  Sleep:  Number of Hours: 6.25     Current Medications: Current Facility-Administered Medications  Medication Dose Route Frequency Provider Last Rate Last Dose  . albuterol (PROVENTIL HFA;VENTOLIN HFA) 108 (90 BASE) MCG/ACT inhaler 2 puff  2 puff Inhalation Q6H PRN Delfin Gant, NP      .  aspirin EC tablet 81 mg  81 mg Oral Daily Delfin Gant, NP   81 mg at 08/04/15 0811  . DULoxetine (CYMBALTA) DR capsule 60 mg  60 mg Oral Daily Jenne Campus, MD   60 mg at 08/04/15 6546  . fluticasone (FLONASE) 50 MCG/ACT nasal spray 1 spray  1 spray Each Nare Daily Delfin Gant, NP   1 spray at 08/01/15 0748  . gabapentin (NEURONTIN) capsule 800 mg  800 mg Oral TID Delfin Gant, NP   800 mg at 08/04/15 1720  . insulin aspart (novoLOG) injection 0-20 Units  0-20 Units Subcutaneous TID WC Eugenie Filler, MD   15 Units at 08/04/15 1723  . insulin aspart (novoLOG) injection 0-5 Units  0-5 Units Subcutaneous QHS Eugenie Filler, MD   0 Units at 08/03/15 2045  . insulin aspart (novoLOG) injection 10 Units  10 Units Subcutaneous TID WC Eugenie Filler, MD   10 Units at 08/04/15 1723  . [START ON 08/05/2015] insulin glargine (LANTUS) injection 38 Units  38 Units Subcutaneous Daily Hosie Poisson, MD      . lamoTRIgine (LAMICTAL) tablet 200 mg  200 mg Oral Daily Delfin Gant, NP   200 mg at 08/04/15 5035   . loratadine (CLARITIN) tablet 10 mg  10 mg Oral Daily Delfin Gant, NP   10 mg at 08/04/15 4656  . LORazepam (ATIVAN) tablet 1 mg  1 mg Oral Q6H PRN Jenne Campus, MD   1 mg at 08/04/15 1330  . lurasidone (LATUDA) tablet 40 mg  40 mg Oral Q breakfast Jenne Campus, MD   40 mg at 08/04/15 8127  . mirabegron ER (MYRBETRIQ) tablet 50 mg  50 mg Oral Daily Delfin Gant, NP   50 mg at 08/04/15 0811  . nicotine (NICODERM CQ - dosed in mg/24 hours) patch 21 mg  21 mg Transdermal Daily Jenne Campus, MD   21 mg at 08/04/15 0813  . pioglitazone (ACTOS) tablet 30 mg  30 mg Oral Daily Delfin Gant, NP   30 mg at 08/04/15 0811  . ramipril (ALTACE) capsule 2.5 mg  2.5 mg Oral Daily Delfin Gant, NP   2.5 mg at 08/04/15 5170  . simvastatin (ZOCOR) tablet 10 mg  10 mg Oral QHS Delfin Gant, NP   10 mg at 08/02/15 2224  . sodium chloride tablet 1 g  1 g Oral BID WC Delfin Gant, NP   1 g at 08/04/15 1720    Lab Results:  Results for orders placed or performed during the hospital encounter of 07/31/15 (from the past 48 hour(s))  Glucose, capillary     Status: Abnormal   Collection Time: 08/02/15  9:50 PM  Result Value Ref Range   Glucose-Capillary 377 (H) 65 - 99 mg/dL  Glucose, capillary     Status: Abnormal   Collection Time: 08/03/15  6:07 AM  Result Value Ref Range   Glucose-Capillary 241 (H) 65 - 99 mg/dL  Hemoglobin A1c     Status: Abnormal   Collection Time: 08/03/15  6:45 AM  Result Value Ref Range   Hgb A1c MFr Bld 9.4 (H) 4.8 - 5.6 %    Comment: (NOTE)         Pre-diabetes: 5.7 - 6.4         Diabetes: >6.4         Glycemic control for adults with diabetes: <7.0    Mean Plasma Glucose  223 mg/dL    Comment: (NOTE) Performed At: [facility], [place] [id number] Lindon Romp MD [id number] Performed at [facility]   Glucose, capillary     Status: Abnormal   Collection Time: 08/03/15 11:55  AM  Result Value Ref Range   Glucose-Capillary 421 (H) 65 - 99 mg/dL  Glucose, capillary     Status: Abnormal   Collection Time: 08/03/15  4:55 PM  Result Value Ref Range   Glucose-Capillary 299 (H) 65 - 99 mg/dL  Glucose, capillary     Status: Abnormal   Collection Time: 08/03/15  8:42 PM  Result Value Ref Range   Glucose-Capillary 102 (H) 65 - 99 mg/dL   Comment 1 Notify RN   Glucose, capillary     Status: Abnormal   Collection Time: 08/04/15  6:22 AM  Result Value Ref Range   Glucose-Capillary 259 (H) 65 - 99 mg/dL  Basic metabolic panel     Status: Abnormal   Collection Time: 08/04/15  6:30 AM  Result Value Ref Range   Sodium 135 135 - 145 mmol/L   Potassium 4.5 3.5 - 5.1 mmol/L   Chloride 98 (L) 101 - 111 mmol/L   CO2 28 22 - 32 mmol/L   Glucose, Bld 262 (H) 65 - 99 mg/dL   BUN 10 6 - 20 mg/dL   Creatinine, Ser 0.95 0.61 - 1.24 mg/dL   Calcium 9.7 8.9 - 10.3 mg/dL   GFR calc non Af Amer >60 >60 mL/min   GFR calc Af Amer >60 >60 mL/min    Comment: (NOTE) The eGFR has been calculated using the CKD EPI equation. This calculation has not been validated in all clinical situations. eGFR's persistently <60 mL/min signify possible Chronic Kidney Disease.    Anion gap 9 5 - 15    Comment: Performed at [facility]  Lipid panel     Status: Abnormal   Collection Time: 08/04/15  6:30 AM  Result Value Ref Range   Cholesterol 193 0 - 200 mg/dL   Triglycerides 117 <150 mg/dL   HDL 59 >40 mg/dL   Total CHOL/HDL Ratio 3.3 RATIO   VLDL 23 0 - 40 mg/dL   LDL Cholesterol 111 (H) 0 - 99 mg/dL    Comment:        Total Cholesterol/HDL:CHD Risk Coronary Heart Disease Risk Table                     Men   Women  1/2 Average Risk   3.4   3.3  Average Risk       5.0   4.4  2 X Average Risk   9.6   7.1  3 X Average Risk  23.4   11.0        Use the calculated Patient Ratio above and the CHD Risk Table to determine the patient's CHD Risk.        ATP III  CLASSIFICATION (LDL):  <100     mg/dL   Optimal  100-129  mg/dL   Near or Above                    Optimal  130-159  mg/dL   Borderline  160-189  mg/dL   High  >190     mg/dL   Very High Performed at [facility]   TSH     Status: None   Collection Time: 08/04/15  6:30 AM  Result Value Ref Range   TSH 1.942 0.350 - 4.500 uIU/mL    Comment: Performed at [facility]  Glucose, capillary     Status: Abnormal   Collection Time: 08/04/15 12:06 PM  Result Value Ref Range   Glucose-Capillary 224 (H) 65 - 99 mg/dL  Glucose, capillary     Status: Abnormal   Collection Time: 08/04/15  4:47 PM  Result Value Ref Range   Glucose-Capillary 328 (H) 65 - 99 mg/dL   Comment 1 Notify RN    Comment 2 Document in Chart     Physical Findings: AIMS: Facial and Oral Movements Muscles of Facial Expression: None, normal Lips and Perioral Area: None, normal Jaw: None, normal Tongue: None, normal,Extremity Movements Upper (arms, wrists, hands, fingers): None, normal Lower (legs, knees, ankles, toes): None, normal, Trunk Movements Neck, shoulders, hips: None, normal, Overall Severity Severity of abnormal movements (highest score from questions above): None, normal Incapacitation due to abnormal movements: None, normal Patient's awareness of abnormal movements (rate only patient's report): No Awareness, Dental Status Current problems with teeth and/or dentures?: No Does patient usually wear dentures?: No  CIWA:    COWS:      Assessment - continue plan   Treatment Plan Summary: Daily contact with patient to assess and evaluate symptoms and progress in treatment, Medication management, Plan inpatient treatment  and medications as below     Medications:  -Continue Cymbalta to 60 mgrs  QDAY   -Continue Neurontin 800 mgrs TID for anxiety, pain -Continue Ativan 1 mgr Q 6 hours PRN for severe anxiety, as needed  -Continue Lamictal 200 mgrs QDAY for mood disorder  -Start  Latuda 40 mgrs QDAY for mood swings -Continue Actos/ insulin to manage DM- Diabetic Consult following   I have co/nsulted Hospitalist Service for assistance regarding Diabetic Management, due to ongoing difficulties with  glycemic control. LABS- Obtain EKG,   Lipid panel , HgbA1C  , TSH    Medical Decision Making:  Established Problem, Stable/Improving (1), Review of Psycho-Social Stressors (1), Review or order clinical lab tests (1) and Review of Medication Regimen & Side Effects (2)   Benjamine Mola, FNP-BC 08/04/2015, 6:55 PM  Reviewed the information documented and agree with the treatment plan.  JONNALAGADDA,JANARDHAHA R. 08/07/2015 10:05 AM

## 2015-08-04 NOTE — [facility] Group Notes (Signed)
[facility] Group Notes:  (Nursing/MHT/Case Management/Adjunct)  Date:  08/04/2015  Time:  9:33 AM  Type of Therapy:  Psychoeducational Skills  Participation Level:  Active  Participation Quality:  Appropriate  Affect:  Appropriate  Cognitive:  Appropriate  Insight:  Appropriate  Engagement in Group:  Engaged  Modes of Intervention:  Discussion  Summary of Progress/Problems: Pt did attend self inventory group.    Forrest, Shalita Shanta 08/04/2015, 9:33 AM 

## 2015-08-04 NOTE — Progress Notes (Signed)
[name] has been out in the milieu ...interacting with his peers and staff. HE makes good eye contact. He is seen laughing and joking. He can say he notices an improvement in his mood, although the latuda is still  Making him sleepy , right after he takes it.   A His cbg's are still on the high side and internal medicine adjusted his lantus and sliding scale and he is tolerating this thus far. He completed his daily assessment and on it he wrote he denied SI and he rated his depression,  Hopelessness and anxiety " 2/0/5", respectively.   R Safety in place .

## 2015-08-04 NOTE — [facility] Group Notes (Signed)
   [facility] LCSW Group Therapy  08/04/2015  11:10 PM  Type of Therapy:  Group Therapy  Participation Level:  Did Not Attend although encouraged by MHT  Summary of Progress/Problems: The main focus of today's process group was for the patient to identify ways in which they have in the past sabotaged their own recovery. Motivational Interviewing was utilized to encourage patient's to explore what they wish to change. Patient's were asked to identify where they are using the stages of Change Model.    Catherine C Harrill, LCSW 

## 2015-08-04 NOTE — Progress Notes (Signed)
Adult Psychoeducational Group Note  Date:  08/04/2015 Time:  9:56 PM  Group Topic/Focus:  Wrap-Up Group:   The focus of this group is to help patients review their daily goal of treatment and discuss progress on daily workbooks.  Participation Level:  Active  Participation Quality:  Appropriate  Affect:  Appropriate  Cognitive:  Appropriate  Insight: Appropriate  Engagement in Group:  Engaged  Modes of Intervention:  Discussion  Additional Co  Octavio Manns 08/04/2015, 9:56 PM

## 2015-08-04 NOTE — Progress Notes (Signed)
D.  Pt pleasant on approach, denies complaints at this time.  Positive for evening wrap up group, interacting appropriately with peers on the unit.  Denies SI/HI/hallucinatons at this time.  A.  Support and encouragement offered  R.  Pt remains safe on unit, will continue to monitor.

## 2015-08-04 NOTE — Progress Notes (Signed)
Patient ID: [name]., male   DOB: December 08, 1975, 39 y.o.   MRN: [id number] Adult Psychoeducational Group Note  Date:  08/04/2015 Time:  10:58 AM  Group Topic/Focus:  Identifying Needs:   The focus of this group is to help patients identify their personal needs that have been historically problematic and identify healthy behaviors to address their needs.  Participation Level:  Active  Participation Quality:  Appropriate  Affect:  Appropriate  Cognitive:  Appropriate  Insight: Appropriate  Engagement in Group:  Engaged  Modes of Intervention:  Activity, Discussion, Education and Support  Additional Comments:  Pt able to identify and appropriately express precipitating reason for admission into [facility] and current energy level.   Aurora Mask 08/04/2015, 10:58 AM

## 2015-08-05 DIAGNOSIS — F1024 Alcohol dependence with alcohol-induced mood disorder: Secondary | ICD-10-CM

## 2015-08-05 DIAGNOSIS — E1165 Type 2 diabetes mellitus with hyperglycemia: Secondary | ICD-10-CM

## 2015-08-05 DIAGNOSIS — J42 Unspecified chronic bronchitis: Secondary | ICD-10-CM

## 2015-08-05 LAB — GLUCOSE, CAPILLARY
GLUCOSE-CAPILLARY: 213 mg/dL — AB (ref 65–99)
GLUCOSE-CAPILLARY: 269 mg/dL — AB (ref 65–99)
Glucose-Capillary: 158 mg/dL — ABNORMAL HIGH (ref 65–99)
Glucose-Capillary: 67 mg/dL (ref 65–99)
Glucose-Capillary: 132 mg/dL — ABNORMAL HIGH (ref 65–99)

## 2015-08-05 MED ORDER — INSULIN GLARGINE 100 UNIT/ML ~~LOC~~ SOLN: 45.0000 [IU] | Freq: Every day | SUBCUTANEOUS | Status: DC

## 2015-08-05 MED ORDER — INSULIN GLARGINE 100 UNIT/ML ~~LOC~~ SOLN
15.0000 [IU] | Freq: Once | SUBCUTANEOUS | Status: AC
  Administered 2015-08-05: 15 [IU] via SUBCUTANEOUS

## 2015-08-05 NOTE — Progress Notes (Signed)
Adult Psychoeducational Group Note  Date:  08/05/2015 Time:  11:13 PM  Group Topic/Focus:  Wrap-Up Group:   The focus of this group is to help patients review their daily goal of treatment and discuss progress on daily workbooks.  Participation Level:  Active  Participation Quality:  Appropriate and Attentive  Affect:  Appropriate  Cognitive:  Appropriate  Insight: Appropriate and Good  Engagement in Group:  Engaged  Modes of Intervention:  Education  Additional Comments:  Pt mentioned his support system is his fiance, family and his NA support group. Pt mentioned his fiance stood by him when he have been feeling his worst.   Merlinda Frederick 08/05/2015, 11:13 PM

## 2015-08-05 NOTE — Progress Notes (Signed)
D Trey Paula has remained in good spirits today, despite not being discharged home ( like he was hoping / expecting ) . He attended his groups, is engaged i the discussions and he cmpleted his daily assessment and on it he wrote he denied SI today and he rated his depression, hopelessness and anxiety " 0/0/3", respectively.   A He is seen on the unit....interacting with his  Peers appropriately and he enjoys laughing and joking with the staff as well.    R POC- he expects dc home tomorrow. States his GF will pick him up.

## 2015-08-05 NOTE — Progress Notes (Signed)
[facility] PROGRESS NOTE  [name]. VHQ:[id number] DOB: 01-04-1976 DOA: 07/31/2015 PCP: No primary care provider on file.  Assessment/Plan: Principal Problem:   Bipolar disorder with depression Active Problems:   Diabetes type 2, uncontrolled   TOBACCO ABUSE   Essential hypertension   COPD (chronic obstructive pulmonary disease)   GERD   Major depressive disorder, recurrent episode, severe   Anxiety disorder   Generalized anxiety disorder   Alcohol dependence with alcohol-induced mood disorder    #1 uncontrolled type 2 diabetes Hemoglobin A1c 9.4. Patient's blood sugars have ranged from 217 - 421 Patient's poorly controlled blood sugars might in part be secondary to current diet.  . Will increase Lantus to 45 units daily. Continue 10 units NovoLog meal coverage. Continue   resistant sliding scale. Continue Actos. Diabetic coordinator also following. Patient will need to follow-up with his endocrinologist on discharge and patient states has an appointment on 08/10/2015.  #2 major depressive disorder/bipolar disorder Per primary team.  #3 tobacco abuse Tobacco cessation. Nicotine patch.  #4 COPD Stable.  #5 hyperlipidemia Agree with checking fasting lipid panel. Continue home dose Zocor.  #6 hypertension Continue ACE inhibitor.     DVT prophylaxsis per psychiatry  Code Status:      Code Status Orders        Start     Ordered   07/31/15 1521  Full code   Continuous     07/31/15 1520     Family Communication: family updated about patient's clinical progress Disposition Plan:  As above    Brief narrative: Patient is a 39 year old gentleman with history of major depressive disorder with recurrent episodes, bipolar disorder, hyperlipidemia, type 2 diabetes on insulin pump at home and oral Actos, hypogonadism, chronic neck pain, prior history of polysubstance abuse who [facility] for major depressive  disorder and bipolar disorder. Patient states he is on insulin pump at home as well as Actos. Patient denies any fevers, no chills, no nausea, no vomiting, no abdominal pain, no diarrhea, no constipation, no dysuria, no weakness, no cough, no melanotic, no hematemesis, no hematochezia. Patient was noted to have blood sugars ranging from 217-491. [facility] were called to help manage patient's DM.   Consultants:  Internal medicine  Procedures:  None  Antibiotics: Anti-infectives    None         HPI/Subjective: CBG still elevated, patient pleasant, no complaints,  Objective: Filed Vitals:   08/04/15 0835 08/04/15 0836 08/05/15 0600 08/05/15 0601  BP: 125/87 146/82 143/90 124/87  Pulse: 91 94 86 97  Temp: 97.9 F (36.6 C)  97.6 F (36.4 C)   TempSrc: Oral     Resp: 18  16   Height:      Weight:       No intake or output data in the 24 hours ending 08/05/15 1116  Exam:  General: No acute respiratory distress Lungs: Clear to auscultation bilaterally without wheezes or crackles Cardiovascular: Regular rate and rhythm without murmur gallop or rub normal S1 and S2 Abdomen: Nontender, nondistended, soft, bowel sounds positive, no rebound, no ascites, no appreciable mass Extremities: No significant cyanosis, clubbing, or edema bilateral lower extremities     Data Review   Micro Results No results found for this or any previous visit (from the past 240 hour(s)).  Radiology Reports No results found.   CBC  Recent Labs Lab 07/30/15 2201  WBC 9.6  HGB 12.9*  HCT  38.0*  PLT 235  MCV 87.6  MCH 29.7  MCHC 33.9  RDW 14.0    Chemistries   Recent Labs Lab 07/30/15 2201 08/02/15 0625 08/04/15 0630  NA 131* 132* 135  K 3.8 4.7 4.5  CL 95* 96* 98*  CO2 GLUCOSE 376* 355* 262*  BUN CREATININE 0.83 0.91 0.95  CALCIUM 9.5 9.7 9.7  AST 20  --   --   ALT 16*  --   --   ALKPHOS 87  --   --   BILITOT 0.5  --   --     ------------------------------------------------------------------------------------------------------------------ estimated creatinine clearance is 116.8 mL/min (by C-G formula based on Cr of 0.95). ------------------------------------------------------------------------------------------------------------------  Recent Labs  08/03/15 0645  HGBA1C 9.4*   ------------------------------------------------------------------------------------------------------------------  Recent Labs  08/04/15 0630  CHOL 193  HDL 59  LDLCALC 111*  TRIG 117  CHOLHDL 3.3   ------------------------------------------------------------------------------------------------------------------  Recent Labs  08/04/15 0630  TSH 1.942   ------------------------------------------------------------------------------------------------------------------ No results for input(s): VITAMINB12, FOLATE, FERRITIN, TIBC, IRON, RETICCTPCT in the last 72 hours.  Coagulation profile No results for input(s): INR, PROTIME in the last 168 hours.  No results for input(s): DDIMER in the last 72 hours.  Cardiac Enzymes No results for input(s): CKMB, TROPONINI, MYOGLOBIN in the last 168 hours.  Invalid input(s): CK ------------------------------------------------------------------------------------------------------------------ Invalid input(s): POCBNP   CBG:  Recent Labs Lab 08/04/15 0622 08/04/15 1206 08/04/15 1647 08/04/15 2114 08/05/15 0614  GLUCAP 259* 224* 328* 184* 213*       Studies: No results found.    Lab Results  Component Value Date   HGBA1C 9.4* 08/03/2015   HGBA1C 9.2* 02/06/2015   HGBA1C 8.5* 08/17/2014   Lab Results  Component Value Date   MICROALBUR 1.4 11/30/2013   LDLCALC 111* 08/04/2015   CREATININE 0.95 08/04/2015       Scheduled Meds: . aspirin EC  81 mg Oral Daily  . DULoxetine  60 mg Oral Daily  . fluticasone  1 spray Each Nare Daily  . gabapentin  800 mg Oral  TID  . insulin aspart  0-20 Units Subcutaneous TID WC  . insulin aspart  0-5 Units Subcutaneous QHS  . insulin aspart  10 Units Subcutaneous TID WC  . insulin glargine  38 Units Subcutaneous Daily  . lamoTRIgine  200 mg Oral Daily  . loratadine  10 mg Oral Daily  . lurasidone  40 mg Oral Q breakfast  . mirabegron ER  50 mg Oral Daily  . nicotine  21 mg Transdermal Daily  . pioglitazone  30 mg Oral Daily  . ramipril  2.5 mg Oral Daily  . simvastatin  10 mg Oral QHS  . sodium chloride  1 g Oral BID WC   Continuous Infusions:   Principal Problem:   Bipolar disorder with depression Active Problems:   Diabetes type 2, uncontrolled   TOBACCO ABUSE   Essential hypertension   COPD (chronic obstructive pulmonary disease)   GERD   Major depressive disorder, recurrent episode, severe   Anxiety disorder   Generalized anxiety disorder   Alcohol dependence with alcohol-induced mood disorder    Time spent: 45 minutes   [facility]  [facility] Pager [phone]. If 7PM-7AM, please contact night-coverage at [email or web address], password [facility] 08/05/2015, 11:16 AM  LOS: 5 days

## 2015-08-05 NOTE — [facility] Group Notes (Signed)
[facility] Group Notes:  (Nursing/MHT/Case Management/Adjunct)  Date:  08/05/2015  Time:  10:05 AM  Type of Therapy:  Psychoeducational Skills  Participation Level:  Active  Participation Quality:  Appropriate  Affect:  Appropriate  Cognitive:  Appropriate  Insight:  Appropriate  Engagement in Group:  Engaged  Modes of Intervention:  Discussion  Summary of Progress/Problems: Pt did attend self inventory group.  [name], Timothy Shanta 08/05/2015, 10:05 AM 

## 2015-08-05 NOTE — Progress Notes (Signed)
Pt came to MHT stating that it felt like his blood glucose level was low.  It was 67 and pt was given juice.  Will recheck at 2030.

## 2015-08-05 NOTE — Progress Notes (Signed)
Patient ID: [name]., male   DOB: 03-19-1976, 39 y.o.   MRN: [id number] [facility] MD Progress Note  08/05/2015 2:49 PM [name].   MRN:  [id number]   Subjective:  Pt states: "I have really responded to [name]. I was just so irritable and about to explode all the time before. I feel much calmer now. I am hoping to leave soon. My mood is much better."   Objective: Pt seen and chart reviewed. Pt is alert/oriented x4, calm, cooperative, and appropriate to situation during assessment. Pt denies suicidal/homicidal ideation and psychosis and does not appear to be responding to internal stimuli. Pt reports great improvement with medication management and that he is participating in groups at this time. Cites good sleep and good appetite. Pt reports that his most important improvement is in his ability to manage his anger and feeling as though he will lose his temper over small things.  Pt is optimistic about outpatient followup. He reports that his blood sugars are elevated because of not having his insulin pump but it will be resumed after discharge.   Principal Problem: Bipolar disorder with depression Diagnosis:   Patient Active Problem List   Diagnosis Date Noted  . Bipolar disorder with depression [F31.30] 07/31/2015  . Insomnia [G47.00]   . Sinusitis, acute maxillary [J01.00] 02/06/2015  . Headache syndrome [G44.89] 02/06/2015  . Atypical pneumonia [J18.9] 01/18/2015  . Renal mass [N28.89] 12/20/2014  . Lung nodule [R91.1] 12/07/2014  . Muscle spasms of neck [M62.48] 10/17/2014  . Bipolar I disorder, most recent episode depressed [F31.30]   . Alcohol dependence with alcohol-induced mood disorder [F10.24]   . MDD (major depressive disorder), severe [F32.2] 09/19/2014  . Hyponatremia [E87.1] 08/15/2014  . Abscess [L02.91] 08/15/2014  . Loss of weight [R63.4] 05/26/2014  . Leukocytosis, unspecified [D72.829] 05/26/2014  . Rash and nonspecific skin eruption [R21] 04/21/2014  .  Unspecified episodic mood disorder [F39] 03/21/2014  . Alcohol dependence [F10.20] 03/21/2014  . Alcohol abuse [F10.10] 03/20/2014  . Cocaine abuse [F14.10] 03/20/2014  . Neck pain [M54.2] 01/31/2014  . Rotator cuff dysfunction [M75.100] 11/13/2013  . Closed fracture of unspecified phalanx or phalanges of hand [S62.609A] 10/04/2013  . Generalized anxiety disorder [F41.1] 08/30/2013  . Anxiety disorder [F41.9] 08/29/2013  . Admitted with dehydration [Z78.9] 08/29/2013  . Gastroenteritis [K52.9] 08/29/2013  . Malnutrition of mild degree [E44.1] 08/29/2013  . Major depressive disorder, recurrent episode, severe [F33.2] 07/14/2013  . HYPOGONADISM [E29.1] 08/28/2009  . 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 Diabetes type 2, uncontrolled [E11.65] 09/30/2006  . HYPERLIPIDEMIA [E78.5] 09/30/2006  . TOBACCO ABUSE [Z72.0] 09/30/2006  . Essential hypertension [I10] 09/30/2006  . ALLERGIC RHINITIS [J30.9] 09/30/2006  . COPD (chronic obstructive pulmonary disease) [J44.9] 09/30/2006  . GERD [K21.9] 09/30/2006  . 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 Backache [M54.9] 09/30/2006  . FIBROMYALGIA [M79.1, M60.9] 09/30/2006  . FATTY LIVER DISEASE, HX OF [Z87.19] 09/30/2006   Total Time spent with patient: 15 minutes   Past Medical History:  Past Medical History  Diagnosis Date  . Allergy   . Depression   . GERD (gastroesophageal reflux disease)   . Hyperlipidemia   . Chronic pain disorder     Sees [facility]  . Urinary incontinence     detrusor instability  . Hypogonadism   . Diabetes mellitus     Type II  . Chronic neck pain   . Bipolar depression   . Opiate dependence, continuous   .  Polysubstance abuse   . Drug-seeking behavior     Past Surgical History  Procedure Laterality Date  . Appendectomy    . Hernia repair    . Nasal sinus surgery  2008   Family History:  Family History  Problem Relation Age of Onset  . Diabetes  Mother   . Hypertension Mother   . Cirrhosis Mother   . Depression Mother   . Bipolar disorder Mother   . Arthritis Father 68    osteoarthritis  . Bipolar disorder Father   . Diabetes Sister     borderline  . Heart disease Paternal Uncle 74    MI  . Heart disease Maternal Grandfather     late 60's--MI  . Cancer Paternal Grandmother 65    lung  . Bipolar disorder Paternal Grandmother   . Heart disease Paternal Grandfather 1    MI  . Bipolar disorder Paternal Grandfather    Social History:  History  Alcohol Use No     History  Drug Use No    Social History   Social History  . Marital Status: Legally Separated    Spouse Name: N/A  . Number of Children: 2  . Years of Education: N/A   Occupational History  . Disabled     chronic pain   Social History Main Topics  . Smoking status: Current Every Day Smoker -- 2.50 packs/day    Types: Cigarettes  . Smokeless tobacco: Never Used  . Alcohol Use: No  . Drug Use: No  . Sexual Activity: Not Asked   Other Topics Concern  . None   Social History Narrative   Additional History:    Sleep: Fair  Appetite:  Fair   Assessment:   Musculoskeletal: Strength & Muscle Tone: within normal limits Gait & Station: normal Patient leans: N/A   Psychiatric Specialty Exam: Physical Exam  Review of Systems  Constitutional: Negative.   HENT: Negative.   Eyes: Negative.   Respiratory: Negative.   Cardiovascular: Negative.   Gastrointestinal: Negative.   Genitourinary: Negative.   Musculoskeletal: Negative.   Skin: Negative.   Neurological: Negative.   Endo/Heme/Allergies: Negative.   Psychiatric/Behavioral: Positive for depression. Negative for suicidal ideas. The patient is nervous/anxious. The patient does not have insomnia.   All other systems reviewed and are negative.  no chest pain, no SOB, no vomiting   Blood pressure 124/87, pulse 97, temperature 97.6 F (36.4 C), temperature source Oral, resp. rate 16,  height 6' 1"  (1.854 m), weight 94.802 kg (209 lb).Body mass index is 27.58 kg/(m^2).  General Appearance: Fairly Groomed  Engineer, water::  Good  Speech:  Normal Rate  Volume:  Normal  Mood:  Euthymic  Affect:  Appropriate and Congruent  Thought Process:  Linear  Orientation:  Full (Time, Place, and Person)  Thought Content:   No hallucinations, no delusions, no grandiosity   Suicidal Thoughts:  No at this time denies any thoughts of hurting self or of suicide  Homicidal Thoughts:  No  Memory:  recent and remote grossly intact   Judgement:  Fair  Insight:  Fair  Psychomotor Activity:  Normal not  psychomotorically agitated   Concentration:  Good  Recall:  Good  Fund of Knowledge:Good  Language: Good  Akathisia:  Negative  Handed:  Right  AIMS (if indicated):     Assets:  Desire for Improvement Resilience Social Support  ADL's:   Fair   Cognition: WNL  Sleep:  Number of Hours: 6.25  Current Medications: Current Facility-Administered Medications  Medication Dose Route Frequency Provider Last Rate Last Dose  . albuterol (PROVENTIL HFA;VENTOLIN HFA) 108 (90 BASE) MCG/ACT inhaler 2 puff  2 puff Inhalation Q6H PRN Delfin Gant, NP      . aspirin EC tablet 81 mg  81 mg Oral Daily Delfin Gant, NP   81 mg at 08/05/15 0748  . DULoxetine (CYMBALTA) DR capsule 60 mg  60 mg Oral Daily Jenne Campus, MD   60 mg at 08/05/15 0749  . fluticasone (FLONASE) 50 MCG/ACT nasal spray 1 spray  1 spray Each Nare Daily Delfin Gant, NP   1 spray at 08/01/15 0748  . gabapentin (NEURONTIN) capsule 800 mg  800 mg Oral TID Delfin Gant, NP   800 mg at 08/05/15 1313  . insulin aspart (novoLOG) injection 0-20 Units  0-20 Units Subcutaneous TID WC Eugenie Filler, MD   11 Units at 08/05/15 1218  . insulin aspart (novoLOG) injection 0-5 Units  0-5 Units Subcutaneous QHS Eugenie Filler, MD   0 Units at 08/03/15 2045  . insulin aspart (novoLOG) injection 10 Units  10 Units  Subcutaneous TID WC Eugenie Filler, MD   10 Units at 08/05/15 1218  . [START ON 08/06/2015] insulin glargine (LANTUS) injection 45 Units  45 Units Subcutaneous Daily Reyne Dumas, MD      . lamoTRIgine (LAMICTAL) tablet 200 mg  200 mg Oral Daily Delfin Gant, NP   200 mg at 08/05/15 0748  . loratadine (CLARITIN) tablet 10 mg  10 mg Oral Daily Delfin Gant, NP   10 mg at 08/04/15 3790  . LORazepam (ATIVAN) tablet 1 mg  1 mg Oral Q6H PRN Jenne Campus, MD   1 mg at 08/05/15 1315  . lurasidone (LATUDA) tablet 40 mg  40 mg Oral Q breakfast Jenne Campus, MD   40 mg at 08/05/15 0749  . mirabegron ER (MYRBETRIQ) tablet 50 mg  50 mg Oral Daily Delfin Gant, NP   50 mg at 08/05/15 0748  . nicotine (NICODERM CQ - dosed in mg/24 hours) patch 21 mg  21 mg Transdermal Daily Jenne Campus, MD   21 mg at 08/05/15 0749  . pioglitazone (ACTOS) tablet 30 mg  30 mg Oral Daily Delfin Gant, NP   30 mg at 08/05/15 0748  . ramipril (ALTACE) capsule 2.5 mg  2.5 mg Oral Daily Delfin Gant, NP   2.5 mg at 08/05/15 0749  . simvastatin (ZOCOR) tablet 10 mg  10 mg Oral QHS Delfin Gant, NP   10 mg at 08/04/15 2204  . sodium chloride tablet 1 g  1 g Oral BID WC Delfin Gant, NP   1 g at 08/05/15 2409    Lab Results:  Results for orders placed or performed during the hospital encounter of 07/31/15 (from the past 48 hour(s))  Glucose, capillary     Status: Abnormal   Collection Time: 08/03/15  4:55 PM  Result Value Ref Range   Glucose-Capillary 299 (H) 65 - 99 mg/dL  Glucose, capillary     Status: Abnormal   Collection Time: 08/03/15  8:42 PM  Result Value Ref Range   Glucose-Capillary 102 (H) 65 - 99 mg/dL   Comment 1 Notify RN   Glucose, capillary     Status: Abnormal   Collection Time: 08/04/15  6:22 AM  Result Value Ref Range   Glucose-Capillary 259 (H) 65 - 99 mg/dL  Basic metabolic panel     Status: Abnormal   Collection Time: 08/04/15  6:30 AM  Result  Value Ref Range   Sodium 135 135 - 145 mmol/L   Potassium 4.5 3.5 - 5.1 mmol/L   Chloride 98 (L) 101 - 111 mmol/L   CO2 28 22 - 32 mmol/L   Glucose, Bld 262 (H) 65 - 99 mg/dL   BUN 10 6 - 20 mg/dL   Creatinine, Ser 0.95 0.61 - 1.24 mg/dL   Calcium 9.7 8.9 - 10.3 mg/dL   GFR calc non Af Amer >60 >60 mL/min   GFR calc Af Amer >60 >60 mL/min    Comment: (NOTE) The eGFR has been calculated using the CKD EPI equation. This calculation has not been validated in all clinical situations. eGFR's persistently <60 mL/min signify possible Chronic Kidney Disease.    Anion gap 9 5 - 15    Comment: Performed at [facility]  Lipid panel     Status: Abnormal   Collection Time: 08/04/15  6:30 AM  Result Value Ref Range   Cholesterol 193 0 - 200 mg/dL   Triglycerides 117 <150 mg/dL   HDL 59 >40 mg/dL   Total CHOL/HDL Ratio 3.3 RATIO   VLDL 23 0 - 40 mg/dL   LDL Cholesterol 111 (H) 0 - 99 mg/dL    Comment:        Total Cholesterol/HDL:CHD Risk Coronary Heart Disease Risk Table                     Men   Women  1/2 Average Risk   3.4   3.3  Average Risk       5.0   4.4  2 X Average Risk   9.6   7.1  3 X Average Risk  23.4   11.0        Use the calculated Patient Ratio above and the CHD Risk Table to determine the patient's CHD Risk.        ATP III CLASSIFICATION (LDL):  <100     mg/dL   Optimal  100-129  mg/dL   Near or Above                    Optimal  130-159  mg/dL   Borderline  160-189  mg/dL   High  >190     mg/dL   Very High Performed at [facility]   TSH     Status: None   Collection Time: 08/04/15  6:30 AM  Result Value Ref Range   TSH 1.942 0.350 - 4.500 uIU/mL    Comment: Performed at [facility]  Glucose, capillary     Status: Abnormal   Collection Time: 08/04/15 12:06 PM  Result Value Ref Range   Glucose-Capillary 224 (H) 65 - 99 mg/dL  Glucose, capillary     Status: Abnormal   Collection Time: 08/04/15  4:47 PM  Result  Value Ref Range   Glucose-Capillary 328 (H) 65 - 99 mg/dL   Comment 1 Notify RN    Comment 2 Document in Chart   Glucose, capillary     Status: Abnormal   Collection Time: 08/04/15  9:14 PM  Result Value Ref Range   Glucose-Capillary 184 (H) 65 - 99 mg/dL  Glucose, capillary     Status: Abnormal   Collection Time: 08/05/15  6:14 AM  Result Value Ref Range   Glucose-Capillary 213 (H) 65 - 99 mg/dL  Glucose, capillary     Status: Abnormal   Collection Time: 08/05/15 12:12 PM  Result Value Ref Range   Glucose-Capillary 269 (H) 65 - 99 mg/dL   Comment 1 Notify RN     Physical Findings: AIMS: Facial and Oral Movements Muscles of Facial Expression: None, normal Lips and Perioral Area: None, normal Jaw: None, normal Tongue: None, normal,Extremity Movements Upper (arms, wrists, hands, fingers): None, normal Lower (legs, knees, ankles, toes): None, normal, Trunk Movements Neck, shoulders, hips: None, normal, Overall Severity Severity of abnormal movements (highest score from questions above): None, normal Incapacitation due to abnormal movements: None, normal Patient's awareness of abnormal movements (rate only patient's report): No Awareness, Dental Status Current problems with teeth and/or dentures?: No Does patient usually wear dentures?: No  CIWA:    COWS:      Assessment - continue plan   Treatment Plan Summary: Daily contact with patient to assess and evaluate symptoms and progress in treatment, Medication management, Plan inpatient treatment  and medications as below     Medications:  -Continue Cymbalta to 60 mgrs  QDAY , to minimize increased mood swings/ instability as a result of  antidepressant use  -Continue Neurontin 800 mgrs TID for anxiety, pain -Continue Ativan 1 mgr Q 6 hours PRN for severe anxiety, as needed  -Continue Lamictal 200 mgrs QDAY for mood disorder  -Continue Latuda 40 mgrs QDAY for mood disorder -Continue Actos/ insulin to manage DM- Diabetic  Consult following  -Lantus insulin increased to 45 units daily due to elevated blood sugars ranging from 217-421. Will need to follow up with his Endocrinologist on discharge -Possible discharge tomorrow (08/06/2015).    Medical Decision Making:  Established Problem, Stable/Improving (1), Review of Psycho-Social Stressors (1), Review or order clinical lab tests (1) and Review of Medication Regimen & Side Effects (2)   DAVIS, LAURA, NP-C 08/05/2015, 2:49 PM  Reviewed the information documented and agree with the treatment plan.  Samantha Ragen,JANARDHAHA R. 08/06/2015 3:23 PM

## 2015-08-05 NOTE — [facility] Group Notes (Signed)
[facility] LCSW Group Therapy  08/05/2015   1:15 PM   Type of Therapy:  Group Therapy  Participation Level:  Minimal  Participation Quality:  Minimal  Affect:  Appropriate, irritable  Cognitive:  Alert and Appropriate  Insight:  Developing/Improving and Engaged  Engagement in Therapy:  Developing/Improving and Engaged  Modes of Intervention:  Clarification, Confrontation, Discussion, Education, Exploration, Limit-setting, Orientation, Problem-solving, Rapport Building, Dance movement psychotherapist, Socialization and Support  Summary of Progress/Problems: The main focus of today's process group was to identify the patient's current support system and decide on other supports that can be put in place.  An emphasis was placed on using counselor, doctor, therapy groups, 12-step groups, and problem-specific support groups to expand supports, as well as doing something different than has been done before.  Pt was quiet through group unless called upon.  Pt shared that he feels he has a great support system but didn't reach out to them or share how he was really feeling.  Pt appeared to actively listen to group discussion.   Teneisha Gignac Horton, LCSW 08/05/2015 2:30 pm

## 2015-08-05 NOTE — [facility] Group Notes (Signed)
[facility] Group Notes:  (Nursing/MHT/Case Management/Adjunct)  Date:  08/05/2015  Time:  10:27 AM  Type of Therapy:  Psychoeducational Skills  Participation Level:  Active  Participation Quality:  Appropriate  Affect:  Appropriate  Cognitive:  Appropriate  Insight:  Appropriate  Engagement in Group:  Engaged  Modes of Intervention:  Discussion  Summary of Progress/Problems: Pt did attend healthy support systems group.  Forrest, Shalita Shanta 08/05/2015, 10:27 AM 

## 2015-08-06 LAB — CBC
HEMATOCRIT: 43.1 % (ref 39.0–52.0)
Hemoglobin: 14.3 g/dL (ref 13.0–17.0)
MCH: 29.4 pg (ref 26.0–34.0)
MCHC: 33.2 g/dL (ref 30.0–36.0)
MCV: 88.7 fL (ref 78.0–100.0)
Platelets: 264 10*3/uL (ref 150–400)
RBC: 4.86 MIL/uL (ref 4.22–5.81)
RDW: 14.2 % (ref 11.5–15.5)
WBC: 7.3 10*3/uL (ref 4.0–10.5)

## 2015-08-06 LAB — COMPREHENSIVE METABOLIC PANEL
ALBUMIN: 4.3 g/dL (ref 3.5–5.0)
ALT: 18 U/L (ref 17–63)
AST: 25 U/L (ref 15–41)
Alkaline Phosphatase: 86 U/L (ref 38–126)
Anion gap: 10 (ref 5–15)
BUN: 10 mg/dL (ref 6–20)
CHLORIDE: 100 mmol/L — AB (ref 101–111)
CO2: 25 mmol/L (ref 22–32)
CREATININE: 0.83 mg/dL (ref 0.61–1.24)
Calcium: 9.4 mg/dL (ref 8.9–10.3)
GFR calc Af Amer: >60 mL/min (ref >60)
GFR calc non Af Amer: >60 mL/min (ref >60)
GLUCOSE: 150 mg/dL — AB (ref 65–99)
POTASSIUM: 4.1 mmol/L (ref 3.5–5.1)
Sodium: 135 mmol/L (ref 135–145)
Total Bilirubin: 0.9 mg/dL (ref 0.3–1.2)
Total Protein: 7.2 g/dL (ref 6.5–8.1)

## 2015-08-06 LAB — HEMOGLOBIN A1C
HEMOGLOBIN A1C: 9.3 % — AB (ref 4.8–5.6)
MEAN PLASMA GLUCOSE: 220 mg/dL

## 2015-08-06 LAB — GLUCOSE, CAPILLARY: Glucose-Capillary: 161 mg/dL — ABNORMAL HIGH (ref 65–99)

## 2015-08-06 MED ORDER — LURASIDONE HCL 40 MG PO TABS: 40.0000 mg | ORAL_TABLET | Freq: Every day | ORAL | Status: DC

## 2015-08-06 MED ORDER — INSULIN ASPART 100 UNIT/ML ~~LOC~~ SOLN: 0.0000 [IU] | Freq: Three times a day (TID) | SUBCUTANEOUS | Status: DC

## 2015-08-06 MED ORDER — RAMIPRIL 2.5 MG PO CAPS: 2.5000 mg | ORAL_CAPSULE | Freq: Every day | ORAL | Status: DC

## 2015-08-06 MED ORDER — ASPIRIN EC 81 MG PO TBEC: 81.0000 mg | DELAYED_RELEASE_TABLET | Freq: Every day | ORAL | Status: DC

## 2015-08-06 MED ORDER — SIMVASTATIN 10 MG PO TABS: 10.0000 mg | ORAL_TABLET | Freq: Every day | ORAL | Status: DC

## 2015-08-06 MED ORDER — CETIRIZINE HCL 10 MG PO TABS: 10.0000 mg | ORAL_TABLET | Freq: Every day | ORAL | Status: DC

## 2015-08-06 MED ORDER — PIOGLITAZONE HCL 30 MG PO TABS: ORAL_TABLET | ORAL | Status: DC

## 2015-08-06 MED ORDER — INSULIN GLARGINE 100 UNIT/ML ~~LOC~~ SOLN: 45.0000 [IU] | Freq: Every day | SUBCUTANEOUS | Status: DC

## 2015-08-06 MED ORDER — DULOXETINE HCL 60 MG PO CPEP: 60.0000 mg | ORAL_CAPSULE | Freq: Every day | ORAL | Status: DC

## 2015-08-06 MED ORDER — GABAPENTIN 800 MG PO TABS: 800.0000 mg | ORAL_TABLET | Freq: Three times a day (TID) | ORAL | Status: DC

## 2015-08-06 MED ORDER — NICOTINE 21 MG/24HR TD PT24: 21.0000 mg | MEDICATED_PATCH | Freq: Every day | TRANSDERMAL | Status: DC

## 2015-08-06 MED ORDER — FLUTICASONE PROPIONATE 50 MCG/ACT NA SUSP: 1.0000 | Freq: Every day | NASAL | Status: DC

## 2015-08-06 MED ORDER — MIRABEGRON ER 50 MG PO TB24: 50.0000 mg | ORAL_TABLET | Freq: Every day | ORAL | Status: DC

## 2015-08-06 MED ORDER — INSULIN ASPART 100 UNIT/ML ~~LOC~~ SOLN: 0.0000 [IU] | Freq: Every day | SUBCUTANEOUS | Status: DC

## 2015-08-06 MED ORDER — LAMOTRIGINE 200 MG PO TABS: 200.0000 mg | ORAL_TABLET | Freq: Every day | ORAL | Status: DC

## 2015-08-06 NOTE — Progress Notes (Signed)
Recreation Therapy Notes  Date: 09.26.2016 Time: 9:30am Location: [address]  Group Topic: Stress Management  Goal Area(s) Addresses:  Patient will actively participate in stress management techniques presented during session.   Behavioral Response: Appropriate, Engaged  Intervention: Stress management techniques  Activity :  Deep Breathing and Guided Imagery. LRT provided instruction and demonstration on practice of Guided Imagery. Technique was coupled with deep breathing.   Education:  Stress Management, Discharge Planning.   Education Outcome: Acknowledges education  Clinical Observations/Feedback: Patient actively engaged in guided imagery script. Identifying no issues and demonstrating ability to practice independently post d/c.   Marykay Lex Blanchfield, LRT/CTRS  Jearl Klinefelter 08/06/2015 12:04 PM
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
## 2015-08-06 NOTE — Tx Team (Signed)
Interdisciplinary Treatment Plan Update (Adult) Date: 08/06/2015   Date: 08/06/2015 10:14 AM  Progress in Treatment:  Attending groups: Yes  Participating in groups: Yes, minimally  Taking medication as prescribed: Yes  Tolerating medication: Yes  Family/Significant othe contact made: No, CSW attempting to make contact with girlfriend Patient understands diagnosis: Yes Discussing patient identified problems/goals with staff: Yes  Medical problems stabilized or resolved: Yes  Denies suicidal/homicidal ideation: Yes Patient has not harmed self or Others: Yes   New problem(s) identified: None identified at this time.   Discharge Plan or Barriers: Pt will return home and follow-up with outpatient resources  Additional comments: n/a   Reason for Continuation of Hospitalization:  Depression Aggression Medication stabilization Suicidal ideation  Estimated length of stay: 3-5 days  Review of initial/current patient goals per problem list:   1.  Goal(s): Patient will participate in aftercare plan  Met:  Yes  Target date: 3-5 days from date of admission   As evidenced by: Patient will participate within aftercare plan AEB aftercare provider and housing plan at discharge being identified.   08/02/15: Pt will return home and follow-up with outpatient resources  2.  Goal (s): Patient will exhibit decreased depressive symptoms and suicidal ideations.  Met:  Yes  Target date: 3-5 days from date of admission   As evidenced by: Patient will utilize self rating of depression at 3 or below and demonstrate decreased signs of depression or be deemed stable for discharge by MD.  08/02/15: Pt rates depression at 5/10. Denies SI  08/06/15: Pt rates depression at 0/10. Denies SI.  Attendees:  Patient:    Family:    Physician: Dr. Parke Poisson, MD  08/06/2015 10:14 AM  Nursing: Lars Pinks, RN Case manager  08/06/2015 10:14 AM  Clinical Social Worker Norman Clay, MSW 08/06/2015 10:14  AM  Other: Lucinda Dell, Beverly Sessions Liasion 08/06/2015 10:14 AM  Clinical:  Gaylan Gerold, RN 08/06/2015 10:14 AM  Other: , RN Charge Nurse 08/06/2015 10:14 AM  Other:     Peri Maris, Latanya Presser MSW

## 2015-08-06 NOTE — Progress Notes (Signed)
Patient ID: [name]., male   DOB: 1976/08/22, 39 y.o.   MRN: [id number]  Pt. Denies SI/HI and A/V hallucinations. Belongings returned to patient at time of discharge. Patient denies any new onset of pain or discomfort. Discharge instructions and medications were reviewed with patient. Patient verbalized understanding of both medications and discharge instructions. Patient was given note for work from [employer] and instructed on insulin sliding scale that internal medicine suggested. It is of not that patient did mention this morning when refusing Lantus that he will be using the insulin pump when he returns home. Writer still went over prescription of Insulin and sliding scale information. Patient verbalized understanding. Q15 minute safety checks maintained until discharge. No distress upon discharge.
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
## 2015-08-06 NOTE — Progress Notes (Signed)
  [facility] Adult Case Management Discharge Plan :  Will you be returning to the same living situation after discharge:  Yes,  Pt returning home At discharge, do you have transportation home?: Yes,  Pt will transport himself home; car at [facility]. Do you have the ability to pay for your medications: Yes,  Pt provided with prescriptions  Release of information consent forms completed and in the chart;  Patient's signature needed at discharge.  Patient to Follow up at: Follow-up Information    Follow up with [facility] On 08/10/2015.   Specialty:  Behavioral Health   Why:  with Dr. Lolly Mustache for medication management.   Contact information:   [address] [place] [address] [phone]      Patient denies SI/HI: Yes,  Pt denies    Safety Planning and Suicide Prevention discussed: Yes,  with Pt; 2 contact attempts made with girlfriend. See SPE note for further detailis  Have you used any form of tobacco in the last 30 days? (Cigarettes, Smokeless Tobacco, Cigars, and/or Pipes): Yes  Has patient been referred to the Quitline?: Yes, faxed on 08/06/15  Elaina Hoops 08/06/2015, 10:16 AM

## 2015-08-06 NOTE — [facility] Group Notes (Signed)
[facility] LCSW Aftercare Discharge Planning Group Note  08/06/2015 8:45 AM  Participation Quality: Alert, Appropriate and Oriented  Mood/Affect: Appropriate; "Lovely"  Depression Rating: 0  Anxiety Rating: 2  Thoughts of Suicide: Pt denies SI/HI  Will you contract for safety? Yes  Current AVH: Pt denies  Plan for Discharge/Comments: Pt attended discharge planning group and actively participated in group. CSW discussed suicide prevention education with the group and encouraged them to discuss discharge planning and any relevant barriers. Pt reports that he is feeling great, to be discharged today. Pt will transport himself home. Requests work note.   Transportation Means: Pt reports access to transportation  Supports: No supports mentioned at this time  Chad Cordial, LCSWA 08/06/2015 9:54 AM

## 2015-08-06 NOTE — [facility] Suicide Risk Assessment (Signed)
[facility] Discharge Suicide Risk Assessment   Demographic Factors:  39 year old man, lives with fiance, employed   Total Time spent with patient: 30 minutes  Musculoskeletal: Strength & Muscle Tone: within normal limits Gait & Station: normal Patient leans: N/A  Psychiatric Specialty Exam: Physical Exam  ROS  Blood pressure 129/85, pulse 109, temperature 98.4 F (36.9 C), temperature source Oral, resp. rate 16, height  (1.854 m), weight 209 lb (94.802 kg).Body mass index is 27.58 kg/(m^2).  General Appearance: Well Groomed  Patent attorney::  Good  Speech:  Normal Rate409  Volume:  Normal  Mood:  Euthymic  Affect:  Appropriate and reactive   Thought Process:  Linear  Orientation:  Full (Time, Place, and Person)  Thought Content:  denies hallucinations, no delusions   Suicidal Thoughts:  No  Homicidal Thoughts:  No  Memory:  recent and remote grossly intact   Judgement:  Other:  improved   Insight:  Present  Psychomotor Activity:  Normal  Concentration:  Good  Recall:  Good  Fund of Knowledge:Good  Language: Good  Akathisia:  Negative  Handed:  Right  AIMS (if indicated):     Assets:  Communication Skills Desire for Improvement Housing Resilience Social Support  Sleep:  Number of Hours: 3.75  Cognition: WNL  ADL's: improved    Have you used any form of tobacco in the last 30 days? (Cigarettes, Smokeless Tobacco, Cigars, and/or Pipes): Yes  Has this patient used any form of tobacco in the last 30 days? (Cigarettes, Smokeless Tobacco, Cigars, and/or Pipes)  We discussed benefits of smoking cessation, and patient states he plans to continue smoking cessation efforts after discharge- plans to use nicoderm patches for cravings as needed   Mental Status Per Nursing Assessment::   On Admission:  Suicidal ideation indicated by patient, Self-harm thoughts  Current Mental Status by Physician: At this time patient is much improved compared to his initial presentation- he is  calm, euthymic, affect brighter and no longer feeling irritable or angry. No SI, No HI, no psychotic symptoms, plans to return to work later this week.  Loss Factors: Recent increased stress related to custody proceedings.  Historical Factors: History of substance abuse - cocaine- now sober for 6 months . History of Bipolar Disorder. History of DM.   Risk Reduction Factors:   Responsible for children under 53 years of age, Sense of responsibility to family, Living with another person, especially a relative, Positive social support and Positive coping skills or problem solving skills  Continued Clinical Symptoms:  Currently much improved - states current medication regimen - Latuda- is effective and well tolerated .  Cognitive Features That Contribute To Risk:  No gross cognitive deficits noted upon discharge. Is alert , attentive, and oriented x 3   Suicide Risk:  Mild:  Suicidal ideation of limited frequency, intensity, duration, and specificity.  There are no identifiable plans, no associated intent, mild dysphoria and related symptoms, good self-control (both objective and subjective assessment), few other risk factors, and identifiable protective factors, including available and accessible social support.  Principal Problem: Bipolar disorder with depression Discharge Diagnoses:  Patient Active Problem List   Diagnosis Date Noted  . Bipolar disorder with depression [F31.30] 07/31/2015  . Insomnia [G47.00]   . Sinusitis, acute maxillary [J01.00] 02/06/2015  . Headache syndrome [G44.89] 02/06/2015  . Atypical pneumonia [J18.9] 01/18/2015  . Renal mass [N28.89] 12/20/2014  . Lung nodule [R91.1] 12/07/2014  . Muscle spasms of neck [M62.48] 10/17/2014  . Bipolar  I disorder, most recent episode depressed [F31.30]   . Alcohol dependence with alcohol-induced mood disorder [F10.24]   . MDD (major depressive disorder), severe [F32.2] 09/19/2014  . Hyponatremia [E87.1] 08/15/2014  .  Abscess [L02.91] 08/15/2014  . Loss of weight [R63.4] 05/26/2014  . Leukocytosis, unspecified [D72.829] 05/26/2014  . Rash and nonspecific skin eruption [R21] 04/21/2014  . Unspecified episodic mood disorder [F39] 03/21/2014  . Alcohol dependence [F10.20] 03/21/2014  . Alcohol abuse [F10.10] 03/20/2014  . Cocaine abuse [F14.10] 03/20/2014  . Neck pain [M54.2] 01/31/2014  . Rotator cuff dysfunction [M75.100] 11/13/2013  . Closed fracture of unspecified phalanx or phalanges of hand [S62.609A] 10/04/2013  . Generalized anxiety disorder [F41.1] 08/30/2013  . Anxiety disorder [F41.9] 08/29/2013  . Admitted with dehydration [Z78.9] 08/29/2013  . Gastroenteritis [K52.9] 08/29/2013  . Malnutrition of mild degree [E44.1] 08/29/2013  . Major depressive disorder, recurrent episode, severe [F33.2] 07/14/2013  . HYPOGONADISM [E29.1] 08/28/2009  . 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 Diabetes type 2, uncontrolled [E11.65] 09/30/2006  . HYPERLIPIDEMIA [E78.5] 09/30/2006  . TOBACCO ABUSE [Z72.0] 09/30/2006  . Essential hypertension [I10] 09/30/2006  . ALLERGIC RHINITIS [J30.9] 09/30/2006  . COPD (chronic obstructive pulmonary disease) [J44.9] 09/30/2006  . GERD [K21.9] 09/30/2006  . 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 Backache [M54.9] 09/30/2006  . FIBROMYALGIA [M79.1, M60.9] 09/30/2006  . FATTY LIVER DISEASE, HX OF [Z87.19] 09/30/2006    Follow-up Information    Follow up with [facility] On 08/10/2015.   Specialty:  Behavioral Health   Why:  with Dr. Lolly Mustache for medication management.   Contact information:   [address] [place] [address] [phone]      Plan Of Care/Follow-up recommendations:  Activity:  as tolerated  Diet:  Diabetic Diet  Tests:  NA Other:  see below  Is patient on multiple antipsychotic therapies at discharge:  No   Has Patient had three or more failed  trials of antipsychotic monotherapy by history:  No  Recommended Plan for Multiple Antipsychotic Therapies: NA  Patient is leaving unit in good spirits . Plans to return home. Follow up as above. Has a PCP - Dr. Lucianne Muss - for management of Bipolar Disorder . Encouraged to continue going to 12 step meetings to continue working on sobriety- recovery.    [name], [name] 08/06/2015, 9:03 AM

## 2015-08-10 ENCOUNTER — Ambulatory Visit (INDEPENDENT_AMBULATORY_CARE_PROVIDER_SITE_OTHER): Payer: 59 | Admitting: Psychiatry

## 2015-08-10 ENCOUNTER — Ambulatory Visit (HOSPITAL_BASED_OUTPATIENT_CLINIC_OR_DEPARTMENT_OTHER)
Admission: RE | Admit: 2015-08-10 | Discharge: 2015-08-10 | Disposition: A | Discharge status: Home / Self Care | Payer: Medicare Other | Source: Ambulatory Visit | Attending: Pulmonary Disease | Admitting: Pulmonary Disease

## 2015-08-10 ENCOUNTER — Encounter (HOSPITAL_COMMUNITY): Payer: Self-pay | Admitting: Psychiatry

## 2015-08-10 VITALS — BP 130/81 | HR 95 | Ht 73.0 in | Wt 224.2 lb

## 2015-08-10 DIAGNOSIS — R911 Solitary pulmonary nodule: Secondary | ICD-10-CM | POA: Diagnosis present

## 2015-08-10 DIAGNOSIS — L738 Other specified follicular disorders: Secondary | ICD-10-CM | POA: Diagnosis not present

## 2015-08-10 DIAGNOSIS — R918 Other nonspecific abnormal finding of lung field: Secondary | ICD-10-CM | POA: Diagnosis not present

## 2015-08-10 DIAGNOSIS — R21 Rash and other nonspecific skin eruption: Secondary | ICD-10-CM | POA: Diagnosis not present

## 2015-08-10 DIAGNOSIS — L089 Local infection of the skin and subcutaneous tissue, unspecified: Secondary | ICD-10-CM | POA: Diagnosis not present

## 2015-08-10 DIAGNOSIS — F3131 Bipolar disorder, current episode depressed, mild: Secondary | ICD-10-CM

## 2015-08-10 MED ORDER — LAMOTRIGINE 100 MG PO TABS: ORAL_TABLET | ORAL | Status: DC

## 2015-08-10 MED ORDER — HYDROXYZINE PAMOATE 100 MG PO CAPS: 100.0000 mg | ORAL_CAPSULE | Freq: Every evening | ORAL | Status: DC | PRN

## 2015-08-10 MED ORDER — LAMOTRIGINE 200 MG PO TABS: 200.0000 mg | ORAL_TABLET | Freq: Every day | ORAL | Status: DC
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
## 2015-08-14 ENCOUNTER — Telehealth: Payer: Self-pay | Admitting: Pulmonary Disease

## 2015-08-14 NOTE — Telephone Encounter (Signed)
Result Notes     Notes Recorded by Jenna Luo, RN on 08/13/2015 at 11:30 AM LMTCx 1 ------  Notes Recorded by Oretha Milch, MD on 08/12/2015 at 11:42 PM Needs OV with TP to discuss Advise 1 yr FU   Spoke with pt. Advised him of the above and he states that he will call back to make an appointment. Nothing further was needed.

## 2015-08-15 DIAGNOSIS — R062 Wheezing: Secondary | ICD-10-CM | POA: Diagnosis not present

## 2015-08-17 ENCOUNTER — Telehealth: Payer: Self-pay | Admitting: Endocrinology

## 2015-08-17 NOTE — Telephone Encounter (Signed)
Nurse was asking about insulin regimen he can take instead of the V-go pump.  Recommended Lantus 30 units daily along with Novolog 6 units before meals.  Also recommended follow-up in the office as soon as possible

## 2015-08-20 ENCOUNTER — Other Ambulatory Visit (HOSPITAL_COMMUNITY): Payer: Self-pay | Admitting: Psychiatry

## 2015-08-20 ENCOUNTER — Other Ambulatory Visit: Payer: Self-pay | Admitting: *Deleted

## 2015-08-20 ENCOUNTER — Other Ambulatory Visit: Payer: Self-pay | Admitting: Endocrinology

## 2015-08-20 ENCOUNTER — Telehealth: Payer: Self-pay | Admitting: Endocrinology

## 2015-08-20 MED ORDER — PIOGLITAZONE HCL 30 MG PO TABS: ORAL_TABLET | ORAL | Status: DC

## 2015-08-20 MED ORDER — V-GO 20 KIT: PACK | Status: DC

## 2015-08-20 NOTE — Telephone Encounter (Signed)
Patient has asked for refills the last several months, he will make an appt.  Get his medication then either cancel or no show.  Please advise if okay to deny or if you want to refill it.

## 2015-08-20 NOTE — Telephone Encounter (Signed)
Patient need refill of insulin for his Vgo pump and a refill of his Actos, [id number] - [place], [place] - [address] [phone] (Phone) [phone] (Fax)

## 2015-08-20 NOTE — Telephone Encounter (Signed)
Okay for 30 days only

## 2015-08-22 ENCOUNTER — Other Ambulatory Visit (HOSPITAL_COMMUNITY): Payer: Self-pay | Admitting: Psychiatry

## 2015-08-27 ENCOUNTER — Telehealth (HOSPITAL_COMMUNITY): Payer: Self-pay

## 2015-08-27 NOTE — Telephone Encounter (Signed)
Medication refill fax received for patient's prescribed Duloxetine.  Patient last evaluated on 08/10/15 and does not return until 09/18/15.  Last order written for Cymbalta on 08/06/15.  Will fax back denial.

## 2015-08-29 ENCOUNTER — Telehealth (HOSPITAL_COMMUNITY): Payer: Self-pay

## 2015-08-29 NOTE — Telephone Encounter (Signed)
Medication management - Telephone call with pt after a call from [facility] reporting they had spoken with pt and he was informing them he did not have another order for Cymbalta received 08/06/15 when discharged from inpatient.  Patient admitted to this nurse he may have that prescription at home as his Cymbalta was cut in half and now only taking one a day.  Patient agreed to call back if could not locate order or if going to run out before return on 09/18/15.  Called and spoke with Efraim KaufmannMelissa, [facility] to relay conversation this nurse had with patient today and plan is for him to call this nurse back with a request for a new Cymbalta order if unable to locate the prescription he received on 08/06/15 when discharged from inpatient services.  Patient to return to see Dr. Lolly MustacheArfeen on 09/18/15.

## 2015-09-01 DIAGNOSIS — M542 Cervicalgia: Secondary | ICD-10-CM | POA: Diagnosis not present

## 2015-09-01 DIAGNOSIS — J3089 Other allergic rhinitis: Secondary | ICD-10-CM | POA: Diagnosis not present

## 2015-09-03 ENCOUNTER — Other Ambulatory Visit (HOSPITAL_COMMUNITY): Payer: Self-pay | Admitting: Psychiatry

## 2015-09-03 DIAGNOSIS — F3131 Bipolar disorder, current episode depressed, mild: Secondary | ICD-10-CM

## 2015-09-04 NOTE — Telephone Encounter (Signed)
Medication refill request - Telephone call from patient stating he has been unable to locate the prescription for Latuda he was given when he left the observation unit 08/06/15 as he recently moved and had not been able to locate the prescription.  Patient reported he returns to see Dr. Lolly MustacheArfeen on 09/18/15 and took the last dose of Latuda he had and would like Dr. Lolly MustacheArfeen to authorize a refill.  Agreed to send request to Dr. Lolly MustacheArfeen.

## 2015-09-04 NOTE — Telephone Encounter (Signed)
Met with Dr. Adele Schilder who authorized a refill of patient's Latuda and [facility] [place].  Called patient to inform order had been e-scribed in for [facility] his [facility] and patient to call back if has any problems.  Patient again stated he had lost the prescription he was given when released from the Observation Unit 08/06/15 as he recently moved and was not sure what he had done with the prescription.

## 2015-09-07 ENCOUNTER — Other Ambulatory Visit (HOSPITAL_COMMUNITY): Payer: Self-pay | Admitting: Psychiatry

## 2015-09-07 ENCOUNTER — Ambulatory Visit (HOSPITAL_COMMUNITY): Payer: 59 | Admitting: Psychiatry

## 2015-09-08 ENCOUNTER — Other Ambulatory Visit (HOSPITAL_COMMUNITY): Payer: Self-pay | Admitting: Psychiatry

## 2015-09-10 ENCOUNTER — Other Ambulatory Visit: Payer: Self-pay | Admitting: Endocrinology

## 2015-09-10 ENCOUNTER — Other Ambulatory Visit (HOSPITAL_COMMUNITY): Payer: Self-pay | Admitting: Psychiatry

## 2015-09-11 ENCOUNTER — Telehealth (HOSPITAL_COMMUNITY): Payer: Self-pay

## 2015-09-11 ENCOUNTER — Other Ambulatory Visit (HOSPITAL_COMMUNITY): Payer: Self-pay | Admitting: Psychiatry

## 2015-09-11 DIAGNOSIS — F3131 Bipolar disorder, current episode depressed, mild: Secondary | ICD-10-CM

## 2015-09-11 MED ORDER — LAMOTRIGINE 150 MG PO TABS: ORAL_TABLET | ORAL | Status: DC

## 2015-09-11 MED ORDER — DULOXETINE HCL 60 MG PO CPEP: 60.0000 mg | ORAL_CAPSULE | Freq: Every day | ORAL | Status: DC

## 2015-09-11 NOTE — Telephone Encounter (Signed)
Telephone call with patient requesting a refill of his Trazodone as states he discussed this with Dr. Lolly MustacheArfeen at evaluation on 08/10/15 wanting to be on this and not Seroquel.  Patient states he has been taking this as he did not like the way Seroquel made him fill and reports Dr. Patrick NorthArfeen okayed changing back.  Agreed to send refill request to Dr. Lolly MustacheArfeen and would contact patient once a decision made about the request.

## 2015-09-12 ENCOUNTER — Other Ambulatory Visit (HOSPITAL_COMMUNITY): Payer: Self-pay | Admitting: Psychiatry

## 2015-09-12 MED ORDER — TRAZODONE HCL 50 MG PO TABS: 50.0000 mg | ORAL_TABLET | Freq: Every evening | ORAL | Status: DC | PRN

## 2015-09-12 NOTE — Progress Notes (Signed)
Patient is taking trazodone for sleep.  He is no longer taking Seroquel.  We will provide trazodone 50 mg daily at bedtime when necessary for insomnia.  Patient is scheduled to see on November 18 for his next appointment.

## 2015-09-13 ENCOUNTER — Other Ambulatory Visit: Payer: Self-pay | Admitting: Endocrinology

## 2015-09-14 ENCOUNTER — Telehealth: Payer: Self-pay | Admitting: *Deleted

## 2015-09-14 NOTE — Telephone Encounter (Signed)
Patient requests a refill of Humalog.  Per Dr. Lucianne MussKumar this was denied. Patient keeps making appts to get medication, then cancelling after medication is received. He must be seen in the office in order to get his medications.

## 2015-09-15 ENCOUNTER — Other Ambulatory Visit: Payer: Self-pay | Admitting: Endocrinology

## 2015-09-15 DIAGNOSIS — R062 Wheezing: Secondary | ICD-10-CM | POA: Diagnosis not present

## 2015-09-17 ENCOUNTER — Telehealth (HOSPITAL_COMMUNITY): Payer: Self-pay | Admitting: *Deleted

## 2015-09-17 NOTE — Telephone Encounter (Signed)
Patient called wanting prescription of Trazadone 100mg . States he normally takes that and we only gave him 50mg . Member has appt with Dr. Lolly MustacheArfeen on tomorrow 11/8, informed patient he will be able to discuss with him at appt.

## 2015-09-18 ENCOUNTER — Ambulatory Visit (HOSPITAL_COMMUNITY): Payer: 59 | Admitting: Psychiatry

## 2015-09-18 ENCOUNTER — Ambulatory Visit: Payer: Self-pay | Admitting: Endocrinology

## 2015-09-19 ENCOUNTER — Telehealth: Payer: Self-pay | Admitting: Endocrinology

## 2015-09-19 NOTE — Telephone Encounter (Signed)
Pt has an appt with us on the 28th now. Can we send in orders for him to have his labs done at the [facility], please call him once this is done, he wants to go to the one on [address]. Also, now that he has an appt can he get a rx to hold him over of his meds.

## 2015-09-19 NOTE — Telephone Encounter (Signed)
I will fax lab order over, no medications can be sent, every time he makes an appointment he requests a refill, then either cancels or no shows. Per Dr. Lucianne MussKumar no refills until he is seen in the office.

## 2015-09-24 ENCOUNTER — Telehealth (HOSPITAL_COMMUNITY): Payer: Self-pay

## 2015-09-24 DIAGNOSIS — F3131 Bipolar disorder, current episode depressed, mild: Secondary | ICD-10-CM

## 2015-09-25 MED ORDER — TRAZODONE HCL 100 MG PO TABS: 100.0000 mg | ORAL_TABLET | Freq: Every day | ORAL | Status: DC

## 2015-09-25 NOTE — Telephone Encounter (Signed)
Medication problem - Telephone call with patient to follow up on his request to go back up to Trazodone 100 mg that he had taken in the past as reports he has not been able to sleep as well with just 50 mg.  Spoke with Dr. Lucianne MussKumar, covering for Dr. Lolly MustacheArfeen while out, who approved increase to 100 mg one at bedtime.  New one [facility] [place] and assisted patient with scheduling next appointment on 11/19/14 at 10:30am.  Patient to call back prior to appointment when he will need more refills of medications.

## 2015-10-01 ENCOUNTER — Other Ambulatory Visit (HOSPITAL_COMMUNITY): Payer: Self-pay | Admitting: Psychiatry

## 2015-10-01 DIAGNOSIS — F3131 Bipolar disorder, current episode depressed, mild: Secondary | ICD-10-CM

## 2015-10-03 ENCOUNTER — Telehealth: Payer: Self-pay | Admitting: Endocrinology

## 2015-10-03 NOTE — Telephone Encounter (Signed)
Met with Dr.Taylor who approved one time refills of patient's Cymbalta, Latuda and Lamictal and new one time orders for each e-scribed to [facility] [place]. Informed patient this date of need to keep appointment now set with Dr. Salem Senate on 10/19/15 and patient agreed with plan. Patient to call if any problems prior to then.

## 2015-10-03 NOTE — Telephone Encounter (Signed)
Telephone call with patient to follow up on medication refill requests for his Latuda, Cymbalta, and Lamictal and rescheduled earlier appointment with Dr. Rutherford Limerickadepalli as patient cancelled last 2 appointments with Dr. Lolly MustacheArfeen and patient recently admitted to observation unit for stabilization.  Informed patient Dr. Lolly MustacheArfeen had expressed concern to this nurse for him to go too long without being seen due to recent increased symptoms and patient agreed with plan to see Dr. Rutherford Limerickadepalli in place of Dr. Lolly MustacheArfeen who cannot get patient in until January on 10/19/15.  Patient requested refills until this time and agreed to request from providers covering for Dr. Lolly MustacheArfeen currently out.

## 2015-10-08 ENCOUNTER — Ambulatory Visit: Payer: Self-pay | Admitting: Endocrinology

## 2015-10-08 ENCOUNTER — Other Ambulatory Visit (HOSPITAL_COMMUNITY): Payer: Self-pay | Admitting: Psychiatry

## 2015-10-08 DIAGNOSIS — Z0289 Encounter for other administrative examinations: Secondary | ICD-10-CM

## 2015-10-10 ENCOUNTER — Other Ambulatory Visit (HOSPITAL_COMMUNITY): Payer: Self-pay | Admitting: Psychiatry

## 2015-10-10 ENCOUNTER — Encounter (HOSPITAL_COMMUNITY): Admission: RE | Payer: Self-pay | Source: Ambulatory Visit

## 2015-10-10 ENCOUNTER — Inpatient Hospital Stay (HOSPITAL_COMMUNITY): Admission: RE | Admit: 2015-10-10 | Payer: Medicare Other | Source: Ambulatory Visit | Admitting: Neurological Surgery

## 2015-10-10 ENCOUNTER — Encounter: Payer: Self-pay | Admitting: Endocrinology

## 2015-10-10 ENCOUNTER — Telehealth: Payer: Self-pay | Admitting: Endocrinology

## 2015-10-10 SURGERY — ANTERIOR CERVICAL DECOMPRESSION/DISCECTOMY FUSION 2 LEVELS
Anesthesia: General

## 2015-10-10 NOTE — Telephone Encounter (Signed)
Patient dismissed from [facility] by Reather LittlerAjay Kumar MD , effective October 10, 2015. Dismissal letter sent out by certified / registered mail.  [facility]  Certified dismissal letter returned as undeliverable, unclaimed, return to sender after three attempts by [employer]. Letter placed in another envelope and resent as 1st class mail which does not require a signature. 10/31/15 DAJ

## 2015-10-10 NOTE — Telephone Encounter (Signed)
Given on 10/03/15. Too soon to refill.

## 2015-10-11 NOTE — Telephone Encounter (Signed)
error 

## 2015-10-13 DIAGNOSIS — M542 Cervicalgia: Secondary | ICD-10-CM | POA: Diagnosis not present

## 2015-10-13 DIAGNOSIS — E119 Type 2 diabetes mellitus without complications: Secondary | ICD-10-CM | POA: Diagnosis not present

## 2015-10-13 DIAGNOSIS — Z794 Long term (current) use of insulin: Secondary | ICD-10-CM | POA: Diagnosis not present

## 2015-10-15 DIAGNOSIS — R062 Wheezing: Secondary | ICD-10-CM | POA: Diagnosis not present

## 2015-10-16 ENCOUNTER — Telehealth (HOSPITAL_COMMUNITY): Payer: Self-pay

## 2015-10-16 NOTE — Telephone Encounter (Signed)
Telephone call with patient to follow up on message he was in need of a new Lamictal and Cymbalta order.  Questioned if patient ever picked up orders from 10/03/15 [facility] [place] by Dr. Ladona Ridgelaylor.  Patient was not sure and stated he threw his bottles away after calling in refills.  Called [facility] and spoke with [place] who reported patient had not picked up these orders and agreed to fill them.  Called patient back to inform orders were being filled for him and he agreed to call back if any further problems getting needed refills.

## 2015-10-19 ENCOUNTER — Ambulatory Visit (HOSPITAL_COMMUNITY): Payer: Self-pay | Admitting: Psychiatry

## 2015-10-20 DIAGNOSIS — B029 Zoster without complications: Secondary | ICD-10-CM | POA: Diagnosis not present

## 2015-10-20 DIAGNOSIS — F17210 Nicotine dependence, cigarettes, uncomplicated: Secondary | ICD-10-CM | POA: Diagnosis not present

## 2015-10-20 DIAGNOSIS — J209 Acute bronchitis, unspecified: Secondary | ICD-10-CM | POA: Diagnosis not present

## 2015-10-25 ENCOUNTER — Other Ambulatory Visit (HOSPITAL_COMMUNITY): Payer: Self-pay | Admitting: Psychiatry

## 2015-10-25 DIAGNOSIS — F3131 Bipolar disorder, current episode depressed, mild: Secondary | ICD-10-CM

## 2015-10-26 DIAGNOSIS — J069 Acute upper respiratory infection, unspecified: Secondary | ICD-10-CM | POA: Diagnosis not present

## 2015-10-26 DIAGNOSIS — B0229 Other postherpetic nervous system involvement: Secondary | ICD-10-CM | POA: Diagnosis not present

## 2015-10-26 DIAGNOSIS — R062 Wheezing: Secondary | ICD-10-CM | POA: Diagnosis not present

## 2015-10-26 DIAGNOSIS — B028 Zoster with other complications: Secondary | ICD-10-CM | POA: Diagnosis not present

## 2015-10-26 DIAGNOSIS — R05 Cough: Secondary | ICD-10-CM | POA: Diagnosis not present

## 2015-10-27 ENCOUNTER — Other Ambulatory Visit (HOSPITAL_COMMUNITY): Payer: Self-pay | Admitting: Psychiatry

## 2015-10-30 NOTE — Telephone Encounter (Signed)
Met with Dr. Lovena Le who approved a one time refill of patient's prescribed Hydroxyzine with instruction no additional refills until evaluated.  E-scribed in new one time order [facility] [place].

## 2015-10-31 ENCOUNTER — Telehealth (HOSPITAL_COMMUNITY): Payer: Self-pay

## 2015-10-31 ENCOUNTER — Emergency Department (HOSPITAL_BASED_OUTPATIENT_CLINIC_OR_DEPARTMENT_OTHER)
Admission: EM | Admit: 2015-10-31 | Discharge: 2015-10-31 | Disposition: A | Discharge status: Home / Self Care | Payer: Medicare Other | Attending: Emergency Medicine | Admitting: Emergency Medicine

## 2015-10-31 ENCOUNTER — Encounter (HOSPITAL_BASED_OUTPATIENT_CLINIC_OR_DEPARTMENT_OTHER): Payer: Self-pay | Admitting: *Deleted

## 2015-10-31 ENCOUNTER — Emergency Department (HOSPITAL_BASED_OUTPATIENT_CLINIC_OR_DEPARTMENT_OTHER): Payer: Medicare Other

## 2015-10-31 DIAGNOSIS — E785 Hyperlipidemia, unspecified: Secondary | ICD-10-CM | POA: Diagnosis not present

## 2015-10-31 DIAGNOSIS — G8929 Other chronic pain: Secondary | ICD-10-CM | POA: Diagnosis not present

## 2015-10-31 DIAGNOSIS — E119 Type 2 diabetes mellitus without complications: Secondary | ICD-10-CM | POA: Diagnosis not present

## 2015-10-31 DIAGNOSIS — Z7982 Long term (current) use of aspirin: Secondary | ICD-10-CM | POA: Insufficient documentation

## 2015-10-31 DIAGNOSIS — E860 Dehydration: Secondary | ICD-10-CM | POA: Diagnosis not present

## 2015-10-31 DIAGNOSIS — Z7984 Long term (current) use of oral hypoglycemic drugs: Secondary | ICD-10-CM | POA: Insufficient documentation

## 2015-10-31 DIAGNOSIS — R509 Fever, unspecified: Secondary | ICD-10-CM | POA: Diagnosis present

## 2015-10-31 DIAGNOSIS — J209 Acute bronchitis, unspecified: Secondary | ICD-10-CM

## 2015-10-31 DIAGNOSIS — R3915 Urgency of urination: Secondary | ICD-10-CM | POA: Diagnosis not present

## 2015-10-31 DIAGNOSIS — Z7951 Long term (current) use of inhaled steroids: Secondary | ICD-10-CM | POA: Insufficient documentation

## 2015-10-31 DIAGNOSIS — F17210 Nicotine dependence, cigarettes, uncomplicated: Secondary | ICD-10-CM | POA: Diagnosis not present

## 2015-10-31 DIAGNOSIS — F3131 Bipolar disorder, current episode depressed, mild: Secondary | ICD-10-CM

## 2015-10-31 DIAGNOSIS — R05 Cough: Secondary | ICD-10-CM | POA: Diagnosis not present

## 2015-10-31 DIAGNOSIS — Z8719 Personal history of other diseases of the digestive system: Secondary | ICD-10-CM | POA: Diagnosis not present

## 2015-10-31 DIAGNOSIS — R0602 Shortness of breath: Secondary | ICD-10-CM | POA: Diagnosis not present

## 2015-10-31 DIAGNOSIS — R109 Unspecified abdominal pain: Secondary | ICD-10-CM | POA: Diagnosis not present

## 2015-10-31 DIAGNOSIS — E291 Testicular hypofunction: Secondary | ICD-10-CM | POA: Diagnosis not present

## 2015-10-31 DIAGNOSIS — Z794 Long term (current) use of insulin: Secondary | ICD-10-CM | POA: Insufficient documentation

## 2015-10-31 DIAGNOSIS — R110 Nausea: Secondary | ICD-10-CM | POA: Diagnosis not present

## 2015-10-31 DIAGNOSIS — N529 Male erectile dysfunction, unspecified: Secondary | ICD-10-CM | POA: Diagnosis not present

## 2015-10-31 DIAGNOSIS — Z79899 Other long term (current) drug therapy: Secondary | ICD-10-CM | POA: Insufficient documentation

## 2015-10-31 DIAGNOSIS — F3130 Bipolar disorder, current episode depressed, mild or moderate severity, unspecified: Secondary | ICD-10-CM | POA: Diagnosis not present

## 2015-10-31 MED ORDER — DOXYCYCLINE HYCLATE 100 MG PO CAPS: 100.0000 mg | ORAL_CAPSULE | Freq: Two times a day (BID) | ORAL | Status: DC

## 2015-10-31 MED ORDER — DOXYCYCLINE HYCLATE 100 MG PO TABS
100.0000 mg | ORAL_TABLET | Freq: Once | ORAL | Status: AC
  Administered 2015-10-31: 100 mg via ORAL
  Filled 2015-10-31: qty 1

## 2015-10-31 MED ORDER — TRAZODONE HCL 100 MG PO TABS: 100.0000 mg | ORAL_TABLET | Freq: Every day | ORAL | Status: DC

## 2015-10-31 NOTE — ED Notes (Signed)
Fever Sunday and Monday, Tuesday am,  States thinking is slow feels weak,  Denies n/v/d,  Pt states cough, congestion, and shingles

## 2015-10-31 NOTE — Telephone Encounter (Signed)
Met with Dr. Lovena Le who approved a one time refill of patient's prescribed Trazodone and [facility] [place].  Telephone message left for patient his requested Trazodone order and Hydroxyzine had been e[facility]scribed [facility] and reminded him of need to keep scheduled appointment on 11/08/15 at 8:20am with Dr. Salem Senate, covering for Dr. Adele Schilder that week.

## 2015-10-31 NOTE — ED Notes (Signed)
C/o fever x 2 days  Broke yesterday am,  Cough, congestion  Denies n/v/d

## 2015-10-31 NOTE — Discharge Instructions (Signed)

## 2015-10-31 NOTE — ED Provider Notes (Signed)
CSN: [id number]     Arrival date & time 10/31/15  0440 History   First MD Initiated Contact with Patient 10/31/15 0503     Chief Complaint  Patient presents with  . Fever     (Consider location/radiation/quality/duration/timing/severity/associated sxs/prior Treatment) HPI  This is a 39 year old male with a history of diabetes. He is here with a four-day history of fever to 103, nonproductive cough, nasal congestion and malaise. He feels sluggish and is having trouble concentrating. He is also having some pain in his right flank resulting from a shingles outbreak last week. He has been using his inhaler and denies shortness of breath at the present time. He denies nausea, vomiting and diarrhea.  Past Medical History  Diagnosis Date  . Allergy   . Depression   . GERD (gastroesophageal reflux disease)   . Hyperlipidemia   . Chronic pain disorder     Sees [facility]  . Urinary incontinence     detrusor instability  . Hypogonadism   . Diabetes mellitus     Type II  . Chronic neck pain   . Bipolar depression ([place])   . Opiate dependence, continuous ([place])   . Polysubstance abuse   . Drug-seeking behavior    Past Surgical History  Procedure Laterality Date  . Appendectomy    . Hernia repair    . Nasal sinus surgery  2008   Family History  Problem Relation Age of Onset  . Diabetes Mother   . Hypertension Mother   . Cirrhosis Mother   . Depression Mother   . Bipolar disorder Mother   . Arthritis Father 49    osteoarthritis  . Bipolar disorder Father   . Diabetes Sister     borderline  . Heart disease Paternal Uncle 83    MI  . Heart disease Maternal Grandfather     late 60's--MI  . Cancer Paternal Grandmother 76    lung  . Bipolar disorder Paternal Grandmother   . Heart disease Paternal Grandfather 72    MI  . Bipolar disorder Paternal Grandfather    Social History  Substance Use Topics  . Smoking status: Current Every Day Smoker -- 2.50 packs/day     Types: Cigarettes  . Smokeless tobacco: Never Used  . Alcohol Use: No    Review of Systems  All other systems reviewed and are negative.   Allergies  Ciprofloxacin  Home Medications   Prior to Admission medications   Medication Sig Start Date End Date Taking? Authorizing Provider  aspirin EC 81 MG tablet Take 1 tablet (81 mg total) by mouth daily. 08/06/15   Benjamine Mola, FNP  cetirizine (ZYRTEC) 10 MG tablet Take 1 tablet (10 mg total) by mouth daily. For allergies 08/06/15   Benjamine Mola, FNP  CIALIS 5 MG tablet Take 5 mg by mouth daily as needed. Erectile dysfunction. 07/27/15   Historical Provider, MD  DULoxetine (CYMBALTA) 60 MG capsule TAKE 1 CAPSULE EVERY DAY 10/03/15   Clarene Reamer, MD  fluticasone [facility]) 50 MCG/ACT nasal spray Place 1 spray into both nostrils daily. 08/06/15   Benjamine Mola, FNP  gabapentin (NEURONTIN) 800 MG tablet Take 1 tablet (800 mg total) by mouth 3 (three) times daily. 08/06/15   Benjamine Mola, FNP  hydrOXYzine (VISTARIL) 50 MG capsule TAKE 2 CAPSULES AT BEDTIME AS NEEDED 10/30/15   Clarene Reamer, MD  insulin aspart (NOVOLOG) 100 UNIT/ML injection Inject 0-20 Units into the skin 3 (three) times daily  with meals. 08/06/15   Benjamine Mola, FNP  insulin aspart (NOVOLOG) 100 UNIT/ML injection Inject 0-5 Units into the skin at bedtime. 08/06/15   Benjamine Mola, FNP  Insulin Disposable Pump (V-GO 20) KIT USE AS DIRECTED. **PT NEEDS APPT WITH DR Dwyane Dee FOR FURTHER REFILLS** 09/11/15   Elayne Snare, MD  insulin glargine (LANTUS) 100 UNIT/ML injection Inject 0.45 mLs (45 Units total) into the skin daily. 08/06/15   Benjamine Mola, FNP  ipratropium-albuterol (DUONEB) 0.5-2.5 (3) MG/3ML SOLN Take 3 mLs by nebulization every 4 (four) hours as needed (for wheezing/shortness of breath).    Historical Provider, MD  lamoTRIgine (LAMICTAL) 150 MG tablet TAKE 2 TABLETS EVERY DAY 10/03/15   Clarene Reamer, MD  LATUDA 40 MG TABS tablet TAKE 1 TABLET EVERY DAY WTIH  BREAKFAST 10/03/15   Clarene Reamer, MD  mirabegron ER (MYRBETRIQ) 50 MG TB24 tablet Take 1 tablet (50 mg total) by mouth daily. 08/06/15   Benjamine Mola, FNP  nicotine (NICODERM CQ - DOSED IN MG/24 HOURS) 21 mg/24hr patch Place 1 patch (21 mg total) onto the skin daily. 08/06/15   Benjamine Mola, FNP  pioglitazone (ACTOS) 30 MG tablet TAKE 1 TABLET (30 MG TOTAL) BY MOUTH DAILY. 09/11/15   Elayne Snare, MD  ramipril (ALTACE) 2.5 MG capsule Take 1 capsule (2.5 mg total) by mouth daily. 08/06/15   Benjamine Mola, FNP  simvastatin (ZOCOR) 10 MG tablet Take 1 tablet (10 mg total) by mouth at bedtime. 08/06/15   Benjamine Mola, FNP  traZODone (DESYREL) 100 MG tablet Take 1 tablet (100 mg total) by mouth at bedtime. 09/25/15   Hampton Abbot, MD   BP 144/91 mmHg  Pulse 86  Temp(Src) 97.4 F (36.3 C) (Oral)  Resp 20  Ht '6\' 1"'  (1.854 m)  Wt 220 lb (99.791 kg)  BMI 29.03 kg/m2  SpO2 99%   Physical Exam  General: Well-developed, well-nourished male in no acute distress; appearance consistent with age of record HENT: normocephalic; atraumatic Eyes: pupils equal, round and reactive to light; extraocular muscles intact Neck: supple Heart: regular rate and rhythm Lungs: Coarse sounds bilaterally; rattly cough Abdomen: soft; nondistended; nontender; bowel sounds present Extremities: No deformity; full range of motion; pulses normal Neurologic: Awake, alert and oriented; motor function intact in all extremities and symmetric; no facial droop Skin: Warm and dry; small patch of crusted lesions right anterolateral abdomen consistent with resolving shingles Psychiatric: Flat affect    ED Course  Procedures (including critical care time)   MDM  Nursing notes and vitals signs, including pulse oximetry, reviewed.  Summary of this visit's results, reviewed by myself:  Imaging Studies: Dg Chest 2 View  10/31/2015  CLINICAL DATA:  Cough and shortness of breath for 4 days.  Smoker. EXAM: CHEST  2 VIEW  COMPARISON:  CT chest August 10, 2015. FINDINGS: Cardiomediastinal silhouette is normal. The lungs are clear without pleural effusions or focal consolidations. Trachea projects midline and there is no pneumothorax. Soft tissue planes and included osseous structures are non-suspicious. Old distal RIGHT clavicle fracture. IMPRESSION: Normal chest radiograph. Electronically Signed   By: Elon Alas M.D.   On: 10/31/2015 05:40   We'll treat for acute bronchitis.     Shanon Rosser, MD 10/31/15 [phone]

## 2015-11-02 ENCOUNTER — Telehealth (HOSPITAL_COMMUNITY): Payer: Self-pay

## 2015-11-02 DIAGNOSIS — F3131 Bipolar disorder, current episode depressed, mild: Secondary | ICD-10-CM

## 2015-11-02 MED ORDER — LURASIDONE HCL 40 MG PO TABS: ORAL_TABLET | ORAL | Status: DC

## 2015-11-02 NOTE — Telephone Encounter (Signed)
Met with Dr. Gerald Taylor helping to cover for providers out this date who approved fax request refill for patient's Latuda.   [facility] [place] and called patient to inform this was approved for one time but he would have to keep his next appointment, set for 11/08/15 with Dr.Tadepalli for any further refills and patient stated understanding.  

## 2015-11-08 ENCOUNTER — Ambulatory Visit (INDEPENDENT_AMBULATORY_CARE_PROVIDER_SITE_OTHER): Payer: Medicare Other | Admitting: Endocrinology

## 2015-11-08 ENCOUNTER — Ambulatory Visit (HOSPITAL_COMMUNITY): Payer: Self-pay | Admitting: Psychiatry

## 2015-11-08 ENCOUNTER — Telehealth: Payer: Self-pay | Admitting: Endocrinology

## 2015-11-08 ENCOUNTER — Other Ambulatory Visit: Payer: Self-pay | Admitting: *Deleted

## 2015-11-08 ENCOUNTER — Encounter: Payer: Self-pay | Admitting: Endocrinology

## 2015-11-08 VITALS — BP 114/68 | HR 96 | Temp 97.9°F | Resp 14 | Ht 73.0 in | Wt 211.8 lb

## 2015-11-08 DIAGNOSIS — E1169 Type 2 diabetes mellitus with other specified complication: Secondary | ICD-10-CM

## 2015-11-08 DIAGNOSIS — E871 Hypo-osmolality and hyponatremia: Secondary | ICD-10-CM | POA: Diagnosis not present

## 2015-11-08 DIAGNOSIS — Z794 Long term (current) use of insulin: Secondary | ICD-10-CM

## 2015-11-08 DIAGNOSIS — E1165 Type 2 diabetes mellitus with hyperglycemia: Secondary | ICD-10-CM

## 2015-11-08 DIAGNOSIS — E785 Hyperlipidemia, unspecified: Secondary | ICD-10-CM | POA: Diagnosis not present

## 2015-11-08 DIAGNOSIS — IMO0002 Reserved for concepts with insufficient information to code with codable children: Secondary | ICD-10-CM

## 2015-11-08 LAB — LIPID PANEL
CHOL/HDL RATIO: 3
CHOLESTEROL: 167 mg/dL (ref 0–200)
HDL: 49.1 mg/dL (ref >39.00)
LDL CALC: 109 mg/dL — AB (ref 0–99)
NonHDL: 117.74
TRIGLYCERIDES: 44 mg/dL (ref 0.0–149.0)
VLDL: 8.8 mg/dL (ref 0.0–40.0)

## 2015-11-08 LAB — COMPREHENSIVE METABOLIC PANEL
ALBUMIN: 4.4 g/dL (ref 3.5–5.2)
ALT: 20 U/L (ref 0–53)
AST: 23 U/L (ref 0–37)
Alkaline Phosphatase: 85 U/L (ref 39–117)
BILIRUBIN TOTAL: 0.4 mg/dL (ref 0.2–1.2)
BUN: 8 mg/dL (ref 6–23)
CALCIUM: 9.7 mg/dL (ref 8.4–10.5)
CO2: 26 mEq/L (ref 19–32)
CREATININE: 0.88 mg/dL (ref 0.40–1.50)
Chloride: 102 mEq/L (ref 96–112)
GFR: 101.43 mL/min (ref >60.00)
Glucose, Bld: 112 mg/dL — ABNORMAL HIGH (ref 70–99)
Potassium: 4.4 mEq/L (ref 3.5–5.1)
SODIUM: 136 meq/L (ref 135–145)
Total Protein: 7.2 g/dL (ref 6.0–8.3)

## 2015-11-08 LAB — MICROALBUMIN / CREATININE URINE RATIO
CREATININE, U: 37.6 mg/dL
MICROALB/CREAT RATIO: 1.9 mg/g (ref 0.0–30.0)

## 2015-11-08 LAB — POCT GLYCOSYLATED HEMOGLOBIN (HGB A1C): Hemoglobin A1C: 8.2

## 2015-11-08 MED ORDER — INSULIN ASPART 100 UNIT/ML ~~LOC~~ SOLN: SUBCUTANEOUS | Status: DC

## 2015-11-08 MED ORDER — V-GO 20 KIT: PACK | Status: DC

## 2015-11-08 MED ORDER — PIOGLITAZONE HCL 30 MG PO TABS: ORAL_TABLET | ORAL | Status: DC

## 2015-11-08 MED ORDER — INSULIN LISPRO 100 UNIT/ML ~~LOC~~ SOLN: SUBCUTANEOUS | Status: DC

## 2015-11-08 NOTE — Progress Notes (Signed)
[name]. 39 y.o.           Reason for Appointment : Followup for Type 2 Diabetes and also evaluation of hyponatremia  History of Present Illness          Diagnosis: Type 2 diabetes mellitus, date of diagnosis: 2005     Past history: He had previously been treated with oral hypoglycemic agents but required insulin in 2009 when he had marked hyperglycemia with ketonuria. Subsequently has been on insulin, continuously since about 2010, usually basal bolus regimen. Also appeared to improve control but using Actoplusmet. However Actos was stopped by his cardiologist because of Edema. In 2011 he was taking only about 30 units of Lantus. Subsequently he required progressively higher doses of Lantus without adequate overnight glucose control. He thinks his A1c had been usually over 9% but no detailed records available However in 09/2013 his A1c was up to 10.4%  Recent history:     INSULIN regimen is described as:  V-go 20 units pump, mealtime boluses 2-3 clicks  He has again been noncompliant with his followup and has not been seen since 01/2015 He typically has had poor control of his diabetes requiring insulin  On his last visit he was switched to the V-go pump which he started in 02/2015 after instructions from nurse educator but he did not follow-up subsequently, previously taking 30 units of Lantus  Current management, blood sugar patterns and problems identified:  He has significant variability in his blood sugars at all times, some affect is related to stress, running out of insulin at times, steroid injection in 12/16 and intercurrent illness  He has checked his blood sugars at all different times of the day including some overnight  His A1c is relatively better at 8.2  Blood sugars were over 300 at times earlier this month  Since he has been getting his insulin consistently and not having as much stress and illness his blood sugars appear to be fairly close to target in  the last week or so  However he has not checked readings FASTING lately  He has had occasional overnight hypoglycemia even without taking any correction boluses at bedtime the night before  Recent postprandial readings have been checked mostly after supper and these fairly good; glucose after lunch yesterday was 182  He was taken off metformin on his last visit when he was having nausea and weight loss but is continuing Actos  Although he has gained weight is weight is relatively better since 9/16  Oral hypoglycemic drugs the patient is taking are: Actos 15 mg       Glucose monitoring:  done  2-3  times a day         Glucometer:  Contour recently Blood Glucose readings as above, overall average for the last 4 weeks is 181 with standard deviation 77 and overall range 42-362  Self-care: The diet that the patient has been following is: Variable   Meals: 3 meals per day. breakfast is at 8 am, dinner 7 pm  avoiding drinks with sugar, small portions usually, may eat cereal on some mornings.  Not eating at consistent times Physical activity: variable from day to day, no formal exercise      Dietician visit: Most recent: Unknown           Wt Readings from Last 3 Encounters:  11/08/15 211 lb 12.8 oz (96.072 kg)  10/31/15 220 lb (99.791 kg)  08/10/15 224 lb 3.2 oz (101.696 kg)  Lab Results  Component Value Date   HGBA1C 8.2 11/08/2015   HGBA1C 9.3* 08/04/2015   HGBA1C 9.4* 08/03/2015   Lab Results  Component Value Date   MICROALBUR 1.4 11/30/2013   LDLCALC 111* 08/04/2015   CREATININE 0.83 08/06/2015            Medication List       This list is accurate as of: 11/08/15 12:57 PM.  Always use your most recent med list.               cetirizine 10 MG tablet  Commonly known as:  ZYRTEC  Take 1 tablet (10 mg total) by mouth daily. For allergies     CIALIS 5 MG tablet  Generic drug:  tadalafil  Take 5 mg by mouth daily as needed. Erectile dysfunction.      doxycycline 100 MG capsule  Commonly known as:  VIBRAMYCIN  Take 1 capsule (100 mg total) by mouth 2 (two) times daily. One po bid x 7 days     DULoxetine 60 MG capsule  Commonly known as:  CYMBALTA  TAKE 1 CAPSULE EVERY DAY     fluticasone 50 MCG/ACT nasal spray  Commonly known as:  FLONASE  Place 1 spray into both nostrils daily.     gabapentin 800 MG tablet  Commonly known as:  NEURONTIN  Take 1 tablet (800 mg total) by mouth 3 (three) times daily.     hydrOXYzine 50 MG capsule  Commonly known as:  VISTARIL  TAKE 2 CAPSULES AT BEDTIME AS NEEDED     insulin aspart 100 UNIT/ML injection  Commonly known as:  novoLOG  Use max 58 units with V-go pump     insulin glargine 100 UNIT/ML injection  Commonly known as:  LANTUS  Inject 0.45 mLs (45 Units total) into the skin daily.     ipratropium-albuterol 0.5-2.5 (3) MG/3ML Soln  Commonly known as:  DUONEB  Take 3 mLs by nebulization every 4 (four) hours as needed (for wheezing/shortness of breath).     lamoTRIgine 150 MG tablet  Commonly known as:  LAMICTAL  TAKE 2 TABLETS EVERY DAY     lurasidone 40 MG Tabs tablet  Commonly known as:  LATUDA  TAKE 1 TABLET EVERY DAY WTIH BREAKFAST     mirabegron ER 50 MG Tb24 tablet  Commonly known as:  MYRBETRIQ  Take 1 tablet (50 mg total) by mouth daily.     pioglitazone 30 MG tablet  Commonly known as:  ACTOS  TAKE 1 TABLET (30 MG TOTAL) BY MOUTH DAILY.     ramipril 2.5 MG capsule  Commonly known as:  ALTACE  Take 1 capsule (2.5 mg total) by mouth daily.     simvastatin 10 MG tablet  Commonly known as:  ZOCOR  Take 1 tablet (10 mg total) by mouth at bedtime.     traZODone 100 MG tablet  Commonly known as:  DESYREL  Take 1 tablet (100 mg total) by mouth at bedtime.     V-GO 20 Kit  USE AS DIRECTED.        Allergies:  Allergies  Allergen Reactions  . Ciprofloxacin Hives  . Hydrocodone Itching    Rash and nausea Rash and nausea    Past Medical History  Diagnosis  Date  . Allergy   . Depression   . GERD (gastroesophageal reflux disease)   . Hyperlipidemia   . Chronic pain disorder     Sees [facility]  . Urinary incontinence  detrusor instability  . Hypogonadism   . Diabetes mellitus     Type II  . Chronic neck pain   . Bipolar depression ([place])   . Opiate dependence, continuous ([place])   . Polysubstance abuse   . Drug-seeking behavior     Past Surgical History  Procedure Laterality Date  . Appendectomy    . Hernia repair    . Nasal sinus surgery  2008    Family History  Problem Relation Age of Onset  . Diabetes Mother   . Hypertension Mother   . Cirrhosis Mother   . Depression Mother   . Bipolar disorder Mother   . Arthritis Father 16    osteoarthritis  . Bipolar disorder Father   . Diabetes Sister     borderline  . Heart disease Paternal Uncle 70    MI  . Heart disease Maternal Grandfather     late 60's--MI  . Cancer Paternal Grandmother 61    lung  . Bipolar disorder Paternal Grandmother   . Heart disease Paternal Grandfather 27    MI  . Bipolar disorder Paternal Grandfather     Social History:  reports that he has been smoking Cigarettes.  He has been smoking about 2.50 packs per day. He has never used smokeless tobacco. He reports that he does not drink alcohol or use illicit drugs.    Review of Systems    He was previously having hyponatremia with relatively low urine osmolality and was told to reduce fluid intake.  He still likes to take some salt tablets every other day or so, he thinks they may be helping his cramps  Lab Results  Component Value Date   CREATININE 0.83 08/06/2015   BUN 10 08/06/2015   NA 135 08/06/2015   K 4.1 08/06/2015   CL 100* 08/06/2015   CO2 25 08/06/2015        Lipids: Long-standing history of hyperlipidemia, primarily high triglycerides. On treatment with 20 mg simvastatin which he started in 9/16   Lab Results  Component Value Date   CHOL 193 08/04/2015    HDL 59 08/04/2015   LDLCALC 111* 08/04/2015   LDLDIRECT 145.6 11/30/2013   TRIG 117 08/04/2015   CHOLHDL 3.3 08/04/2015      He has long-standing depression, he is  on Lamictal. On Cymbalta for quite some time also       He has been treated with ramipril by his PCP for "kidney protection" Lightheadedness not present  HYPOGONADISM: Previous history as follows: He has had hypogonadism treated by urologist. Has had Testopel and his last 2 testosterone levels were reportedly in the 500-600 range. No recent reports available   He continues to have fatigue but has had multiple other medical problems He had been treated by his urologist Dr Felipa Eth for about 3 years for a diagnosis of hypogonadism with  Testopel Has not had any detailed evaluation for establishing etiology in October 2014 his prolactin level was high at 43 His total testosterone was minimally low at 330 and LH was 5.1  He was taking Risperdal  when he was tested for the prolactin  Physical Examination:  BP 114/68 mmHg  Pulse 96  Temp(Src) 97.9 F (36.6 C)  Resp 14  Ht '6\' 1"'  (1.854 m)  Wt 211 lb 12.8 oz (96.072 kg)  BMI 27.95 kg/m2  SpO2 98%  Standing blood pressure 110/78  ASSESSMENT/PLAN  Diabetes type 2, uncontrolled      He has had long-standing diabetes which is  insulin requiring at present See history of present illness for detailed discussion of his current management, blood sugar patterns and problems identified  Although difficult to assess his actual control with using the V-go pump over the last 8 months he appears to be doing better with that compared to Lantus and Novolog He has had various issues that have prevented his control from being consistent as discussed above However more recently his blood sugars appear to be excellent when he has checked them including some tendency to overnight hypoglycemia   He will continue the 20 units basal on the V-go pump and new prescription sent He will  continue to adjust his boluses based on meal size and carbohydrate intake as well as take extra 2 units bolus for significant carbohydrate snacks He may need to have a snack at bedtime with protein if starting to get low sugars again Continue Actos More consistent monitoring at various times including fasting  HYPERLIPIDEMIA:  He will have his labs checked again since they have not been done since starting simvastatin Previously had high triglyceride also  Hyponatremia:  Sodium to be rechecked Do not think he has salt wasting as his urinalysis osmolality  previously was low and does not need to take salt intake  Counseling time on subjects discussed above is over 50% of today's 25 minute visit  Patient Instructions  Check blood sugars on waking up 2-3  times a week Also check blood sugars about 2 hours after a meal and do this after different meals by rotation  Recommended blood sugar levels on waking up is 90-130 and about 2 hours after meal is 130-160  Please bring your blood sugar monitor to each visit, thank you      [facility] 11/08/2015, 12:57 PM

## 2015-11-08 NOTE — Patient Instructions (Signed)
Check blood sugars on waking up 2-3 times a week Also check blood sugars about 2 hours after a meal and do this after different meals by rotation  Recommended blood sugar levels on waking up is 90-130 and about 2 hours after meal is 130-160  Please bring your blood sugar monitor to each visit, thank you  

## 2015-11-08 NOTE — Telephone Encounter (Signed)
Dismissal letter returned as undeliverable, unclaimed, return to sender by [employer]. Letter placed in another envelope and forwarded to new address provided by [employer]  Document scanned under media tab  11/08/15 DAJ

## 2015-11-14 ENCOUNTER — Ambulatory Visit (HOSPITAL_COMMUNITY): Payer: Self-pay | Admitting: Psychiatry

## 2015-11-14 ENCOUNTER — Telehealth (HOSPITAL_COMMUNITY): Payer: Self-pay

## 2015-11-14 NOTE — Telephone Encounter (Signed)
Medication management - Telephone message left for pt. after he no showed for evaluation this date to inform he had missed several appointments and needed to keep appt. set for Dr. Lolly MustacheArfeen for 11/20/15 to continue to provide medications.  Requested patient call back if could not keep that appointment or if we needed to set up another time that worked best for him but informed we could not continue to provide medication for patient if he does not keep any appointments.  Requested patient call back to discuss if cannot keep appointment on 11/20/15.

## 2015-11-15 ENCOUNTER — Ambulatory Visit (INDEPENDENT_AMBULATORY_CARE_PROVIDER_SITE_OTHER): Payer: Medicare Other | Admitting: Psychiatry

## 2015-11-15 ENCOUNTER — Encounter (HOSPITAL_COMMUNITY): Payer: Self-pay | Admitting: Emergency Medicine

## 2015-11-15 ENCOUNTER — Encounter (HOSPITAL_COMMUNITY): Payer: Self-pay | Admitting: Psychiatry

## 2015-11-15 ENCOUNTER — Inpatient Hospital Stay (HOSPITAL_COMMUNITY)
Admission: AD | Admit: 2015-11-15 | Discharge: 2015-11-21 | DRG: 885 | Disposition: A | Discharge status: Home / Self Care | Payer: 59 | Source: Intra-hospital | Attending: Psychiatry | Admitting: Psychiatry

## 2015-11-15 ENCOUNTER — Encounter (HOSPITAL_COMMUNITY): Payer: Self-pay

## 2015-11-15 ENCOUNTER — Emergency Department (HOSPITAL_COMMUNITY)
Admission: EM | Admit: 2015-11-15 | Discharge: 2015-11-15 | Disposition: A | Discharge status: Other Acute Inpatient Hospital | Payer: 59 | Source: Home / Self Care | Attending: Emergency Medicine | Admitting: Emergency Medicine

## 2015-11-15 DIAGNOSIS — F17210 Nicotine dependence, cigarettes, uncomplicated: Secondary | ICD-10-CM | POA: Diagnosis present

## 2015-11-15 DIAGNOSIS — F319 Bipolar disorder, unspecified: Secondary | ICD-10-CM

## 2015-11-15 DIAGNOSIS — Z794 Long term (current) use of insulin: Secondary | ICD-10-CM | POA: Insufficient documentation

## 2015-11-15 DIAGNOSIS — Z833 Family history of diabetes mellitus: Secondary | ICD-10-CM

## 2015-11-15 DIAGNOSIS — R062 Wheezing: Secondary | ICD-10-CM | POA: Diagnosis not present

## 2015-11-15 DIAGNOSIS — Z8719 Personal history of other diseases of the digestive system: Secondary | ICD-10-CM

## 2015-11-15 DIAGNOSIS — E119 Type 2 diabetes mellitus without complications: Secondary | ICD-10-CM | POA: Insufficient documentation

## 2015-11-15 DIAGNOSIS — E785 Hyperlipidemia, unspecified: Secondary | ICD-10-CM | POA: Diagnosis present

## 2015-11-15 DIAGNOSIS — G8929 Other chronic pain: Secondary | ICD-10-CM

## 2015-11-15 DIAGNOSIS — F411 Generalized anxiety disorder: Secondary | ICD-10-CM | POA: Diagnosis present

## 2015-11-15 DIAGNOSIS — F3130 Bipolar disorder, current episode depressed, mild or moderate severity, unspecified: Secondary | ICD-10-CM

## 2015-11-15 DIAGNOSIS — R45851 Suicidal ideations: Secondary | ICD-10-CM

## 2015-11-15 DIAGNOSIS — Z79899 Other long term (current) drug therapy: Secondary | ICD-10-CM | POA: Insufficient documentation

## 2015-11-15 DIAGNOSIS — I10 Essential (primary) hypertension: Secondary | ICD-10-CM | POA: Diagnosis present

## 2015-11-15 DIAGNOSIS — Z7951 Long term (current) use of inhaled steroids: Secondary | ICD-10-CM

## 2015-11-15 DIAGNOSIS — G4700 Insomnia, unspecified: Secondary | ICD-10-CM | POA: Diagnosis present

## 2015-11-15 DIAGNOSIS — Z9641 Presence of insulin pump (external) (internal): Secondary | ICD-10-CM | POA: Diagnosis present

## 2015-11-15 DIAGNOSIS — Z818 Family history of other mental and behavioral disorders: Secondary | ICD-10-CM

## 2015-11-15 DIAGNOSIS — J449 Chronic obstructive pulmonary disease, unspecified: Secondary | ICD-10-CM | POA: Diagnosis present

## 2015-11-15 DIAGNOSIS — M549 Dorsalgia, unspecified: Secondary | ICD-10-CM | POA: Diagnosis present

## 2015-11-15 DIAGNOSIS — K219 Gastro-esophageal reflux disease without esophagitis: Secondary | ICD-10-CM | POA: Diagnosis present

## 2015-11-15 DIAGNOSIS — M797 Fibromyalgia: Secondary | ICD-10-CM | POA: Diagnosis present

## 2015-11-15 DIAGNOSIS — E7800 Pure hypercholesterolemia, unspecified: Secondary | ICD-10-CM | POA: Diagnosis present

## 2015-11-15 DIAGNOSIS — Z8249 Family history of ischemic heart disease and other diseases of the circulatory system: Secondary | ICD-10-CM

## 2015-11-15 LAB — COMPREHENSIVE METABOLIC PANEL
ALT: 20 U/L (ref 17–63)
AST: 20 U/L (ref 15–41)
Albumin: 4.6 g/dL (ref 3.5–5.0)
Alkaline Phosphatase: 90 U/L (ref 38–126)
Anion gap: 11 (ref 5–15)
BUN: 8 mg/dL (ref 6–20)
CHLORIDE: 96 mmol/L — AB (ref 101–111)
CO2: 27 mmol/L (ref 22–32)
CREATININE: 0.86 mg/dL (ref 0.61–1.24)
Calcium: 9.5 mg/dL (ref 8.9–10.3)
GFR calc non Af Amer: >60 mL/min (ref >60)
Glucose, Bld: 164 mg/dL — ABNORMAL HIGH (ref 65–99)
Potassium: 4.3 mmol/L (ref 3.5–5.1)
SODIUM: 134 mmol/L — AB (ref 135–145)
Total Bilirubin: 0.8 mg/dL (ref 0.3–1.2)
Total Protein: 7.4 g/dL (ref 6.5–8.1)

## 2015-11-15 LAB — URINALYSIS, ROUTINE W REFLEX MICROSCOPIC
Bilirubin Urine: NEGATIVE
GLUCOSE, UA: NEGATIVE mg/dL
Hgb urine dipstick: NEGATIVE
Ketones, ur: NEGATIVE mg/dL
LEUKOCYTES UA: NEGATIVE
Nitrite: NEGATIVE
PROTEIN: NEGATIVE mg/dL
Specific Gravity, Urine: 1.006 (ref 1.005–1.030)
pH: 6.5 (ref 5.0–8.0)

## 2015-11-15 LAB — CBC WITH DIFFERENTIAL/PLATELET
BASOS ABS: 0 10*3/uL (ref 0.0–0.1)
BASOS PCT: 0 %
EOS ABS: 0.1 10*3/uL (ref 0.0–0.7)
EOS PCT: 1 %
HCT: 43.4 % (ref 39.0–52.0)
Hemoglobin: 14.2 g/dL (ref 13.0–17.0)
Lymphocytes Relative: 15 %
Lymphs Abs: 1.4 10*3/uL (ref 0.7–4.0)
MCH: 30 pg (ref 26.0–34.0)
MCHC: 32.7 g/dL (ref 30.0–36.0)
MCV: 91.6 fL (ref 78.0–100.0)
Monocytes Absolute: 0.8 10*3/uL (ref 0.1–1.0)
Monocytes Relative: 9 %
Neutro Abs: 7.3 10*3/uL (ref 1.7–7.7)
Neutrophils Relative %: 75 %
PLATELETS: 282 10*3/uL (ref 150–400)
RBC: 4.74 MIL/uL (ref 4.22–5.81)
RDW: 14.6 % (ref 11.5–15.5)
WBC: 9.6 10*3/uL (ref 4.0–10.5)

## 2015-11-15 LAB — SALICYLATE LEVEL: Salicylate Lvl: <4 mg/dL (ref 2.8–30.0)

## 2015-11-15 LAB — ETHANOL

## 2015-11-15 LAB — RAPID URINE DRUG SCREEN, HOSP PERFORMED
Amphetamines: NOT DETECTED
BENZODIAZEPINES: NOT DETECTED
Barbiturates: NOT DETECTED
COCAINE: NOT DETECTED
OPIATES: NOT DETECTED
Tetrahydrocannabinol: NOT DETECTED

## 2015-11-15 LAB — CBG MONITORING, ED: Glucose-Capillary: 154 mg/dL — ABNORMAL HIGH (ref 65–99)

## 2015-11-15 LAB — ACETAMINOPHEN LEVEL

## 2015-11-15 LAB — GLUCOSE, CAPILLARY: Glucose-Capillary: 215 mg/dL — ABNORMAL HIGH (ref 65–99)

## 2015-11-15 MED ORDER — MIRABEGRON ER 50 MG PO TB24
50.0000 mg | ORAL_TABLET | Freq: Every day | ORAL | Status: DC
  Administered 2015-11-16 – 2015-11-21 (×6): 50 mg via ORAL
  Filled 2015-11-15 (×7): qty 1

## 2015-11-15 MED ORDER — INSULIN ASPART 100 UNIT/ML ~~LOC~~ SOLN
8.0000 [IU] | Freq: Every day | SUBCUTANEOUS | Status: DC
  Administered 2015-11-15: 5 [IU] via SUBCUTANEOUS

## 2015-11-15 MED ORDER — HYDROXYZINE HCL 50 MG PO TABS
50.0000 mg | ORAL_TABLET | Freq: Three times a day (TID) | ORAL | Status: DC | PRN
  Administered 2015-11-16 – 2015-11-18 (×5): 50 mg via ORAL
  Filled 2015-11-15 (×7): qty 1

## 2015-11-15 MED ORDER — SIMVASTATIN 10 MG PO TABS
10.0000 mg | ORAL_TABLET | Freq: Every day | ORAL | Status: DC
  Administered 2015-11-15: 10 mg via ORAL
  Filled 2015-11-15: qty 1
  Filled 2015-11-15: qty 2

## 2015-11-15 MED ORDER — FLUTICASONE PROPIONATE 50 MCG/ACT NA SUSP
1.0000 | Freq: Every day | NASAL | Status: DC
  Administered 2015-11-16 – 2015-11-21 (×4): 1 via NASAL
  Filled 2015-11-15 (×2): qty 16

## 2015-11-15 MED ORDER — ACETAMINOPHEN 325 MG PO TABS
650.0000 mg | ORAL_TABLET | Freq: Four times a day (QID) | ORAL | Status: DC | PRN
  Administered 2015-11-16 – 2015-11-21 (×6): 650 mg via ORAL
  Filled 2015-11-15 (×6): qty 2

## 2015-11-15 MED ORDER — INSULIN ASPART 100 UNIT/ML ~~LOC~~ SOLN: 8.0000 [IU] | Freq: Every day | SUBCUTANEOUS | Status: DC

## 2015-11-15 MED ORDER — GABAPENTIN 800 MG PO TABS
800.0000 mg | ORAL_TABLET | Freq: Three times a day (TID) | ORAL | Status: DC
  Administered 2015-11-16 – 2015-11-21 (×17): 800 mg via ORAL
  Filled 2015-11-15 (×19): qty 1

## 2015-11-15 MED ORDER — IPRATROPIUM-ALBUTEROL 0.5-2.5 (3) MG/3ML IN SOLN
3.0000 mL | RESPIRATORY_TRACT | Status: DC | PRN
  Filled 2015-11-15: qty 3

## 2015-11-15 MED ORDER — NICOTINE 21 MG/24HR TD PT24
21.0000 mg | MEDICATED_PATCH | Freq: Every day | TRANSDERMAL | Status: DC
  Administered 2015-11-15 – 2015-11-21 (×7): 21 mg via TRANSDERMAL
  Filled 2015-11-15 (×8): qty 1

## 2015-11-15 MED ORDER — PIOGLITAZONE HCL 30 MG PO TABS
30.0000 mg | ORAL_TABLET | Freq: Every day | ORAL | Status: DC
  Administered 2015-11-16 – 2015-11-21 (×6): 30 mg via ORAL
  Filled 2015-11-15 (×7): qty 1

## 2015-11-15 MED ORDER — LURASIDONE HCL 40 MG PO TABS
40.0000 mg | ORAL_TABLET | Freq: Every day | ORAL | Status: DC
  Administered 2015-11-16 – 2015-11-17 (×2): 40 mg via ORAL
  Filled 2015-11-15 (×4): qty 1

## 2015-11-15 MED ORDER — ALUM & MAG HYDROXIDE-SIMETH 200-200-20 MG/5ML PO SUSP: 30.0000 mL | ORAL | Status: DC | PRN

## 2015-11-15 MED ORDER — NICOTINE 21 MG/24HR TD PT24: 21.0000 mg | MEDICATED_PATCH | Freq: Every day | TRANSDERMAL | Status: DC

## 2015-11-15 MED ORDER — LAMOTRIGINE 150 MG PO TABS
300.0000 mg | ORAL_TABLET | Freq: Every day | ORAL | Status: DC
  Administered 2015-11-16 – 2015-11-21 (×6): 300 mg via ORAL
  Filled 2015-11-15 (×2): qty 2
  Filled 2015-11-15: qty 3
  Filled 2015-11-15 (×4): qty 2

## 2015-11-15 MED ORDER — DULOXETINE HCL 60 MG PO CPEP
60.0000 mg | ORAL_CAPSULE | Freq: Every day | ORAL | Status: DC
  Administered 2015-11-16 – 2015-11-19 (×4): 60 mg via ORAL
  Filled 2015-11-15 (×5): qty 1

## 2015-11-15 MED ORDER — QUETIAPINE FUMARATE 100 MG PO TABS
100.0000 mg | ORAL_TABLET | Freq: Every day | ORAL | Status: DC
  Administered 2015-11-15 – 2015-11-16 (×2): 100 mg via ORAL
  Filled 2015-11-15 (×5): qty 1

## 2015-11-15 MED ORDER — INSULIN ASPART 100 UNIT/ML ~~LOC~~ SOLN
0.0000 [IU] | Freq: Three times a day (TID) | SUBCUTANEOUS | Status: DC
  Administered 2015-11-16: 8 [IU] via SUBCUTANEOUS
  Administered 2015-11-16: 5 [IU] via SUBCUTANEOUS
  Administered 2015-11-16 – 2015-11-17 (×2): 8 [IU] via SUBCUTANEOUS
  Administered 2015-11-17: 3 [IU] via SUBCUTANEOUS
  Administered 2015-11-17 – 2015-11-18 (×2): 11 [IU] via SUBCUTANEOUS
  Administered 2015-11-18: 3 [IU] via SUBCUTANEOUS
  Administered 2015-11-18 – 2015-11-19 (×2): 8 [IU] via SUBCUTANEOUS
  Administered 2015-11-19 (×2): 15 [IU] via SUBCUTANEOUS
  Administered 2015-11-20: 8 [IU] via SUBCUTANEOUS
  Administered 2015-11-20: 11 [IU] via SUBCUTANEOUS
  Administered 2015-11-20 – 2015-11-21 (×2): 8 [IU] via SUBCUTANEOUS
  Administered 2015-11-21: 11 [IU] via SUBCUTANEOUS

## 2015-11-15 MED ORDER — MAGNESIUM HYDROXIDE 400 MG/5ML PO SUSP: 30.0000 mL | Freq: Every day | ORAL | Status: DC | PRN

## 2015-11-15 MED ORDER — FLUTICASONE FUROATE-VILANTEROL 100-25 MCG/INH IN AEPB: 1.0000 | INHALATION_SPRAY | Freq: Every day | RESPIRATORY_TRACT | Status: DC

## 2015-11-15 MED ORDER — TRAZODONE HCL 100 MG PO TABS: 100.0000 mg | ORAL_TABLET | Freq: Every day | ORAL | Status: DC

## 2015-11-15 MED ORDER — LORATADINE 10 MG PO TABS
10.0000 mg | ORAL_TABLET | Freq: Every day | ORAL | Status: DC
  Administered 2015-11-16 – 2015-11-21 (×6): 10 mg via ORAL
  Filled 2015-11-15 (×8): qty 1

## 2015-11-15 MED ORDER — RAMIPRIL 2.5 MG PO CAPS
2.5000 mg | ORAL_CAPSULE | Freq: Every day | ORAL | Status: DC
  Administered 2015-11-16 – 2015-11-21 (×6): 2.5 mg via ORAL
  Filled 2015-11-15 (×7): qty 1

## 2015-11-15 NOTE — [facility] Counselor (Addendum)
Julieanne CottonJosephine, NP concurred with Dr. Robert BellowArfeens recommendations. Per [facility], no current inpatient beds available for this patient. Patient to be placed in the OBS unit with intentions to move him to the inpatient unit when a bed becomes available. Patient accepted to OBS bed #2. Patient may come to Obs approximately 1915. Nursing report [phone]. Support paperwork completed. [facility]. Nursing staff will need to make the transportation arrangements when it is time for patient to go.

## 2015-11-15 NOTE — H&P (Signed)
Obs Admission Assessment Adult  Patient Identification: [name]. MRN:  [id number] Date of Evaluation:  11/15/2015 Chief Complaint:  BIpolar Disorder Principal Diagnosis: Bipolar I disorder, most recent episode depressed ([place]) Diagnosis:   Patient Active Problem List   Diagnosis Date Noted  . Bipolar 1 disorder, depressed ([place]) [F31.9] 11/15/2015  . Bipolar disorder with depression ([place]) [F31.30] 07/31/2015  . Insomnia [G47.00]   . Sinusitis, acute maxillary [J01.00] 02/06/2015  . Headache syndrome [G44.89] 02/06/2015  . Atypical pneumonia [J18.9] 01/18/2015  . Renal mass [N28.89] 12/20/2014  . Lung nodule [R91.1] 12/07/2014  . Muscle spasms of neck [M62.48] 10/17/2014  . Bipolar I disorder, most recent episode depressed ([place]) [F31.30]   . Alcohol dependence with alcohol-induced mood disorder ([place]) [F10.24]   . MDD (major depressive disorder), severe ([place]) [F32.2] 09/19/2014  . Hyponatremia [E87.1] 08/15/2014  . Abscess [L02.91] 08/15/2014  . Loss of weight [R63.4] 05/26/2014  . Leukocytosis, unspecified [D72.829] 05/26/2014  . Rash and nonspecific skin eruption [R21] 04/21/2014  . Unspecified episodic mood disorder [F39] 03/21/2014  . Alcohol dependence ([place]) [F10.20] 03/21/2014  . Alcohol abuse [F10.10] 03/20/2014  . Cocaine abuse [F14.10] 03/20/2014  . Neck pain [M54.2] 01/31/2014  . Rotator cuff dysfunction [M75.100] 11/13/2013  . Closed fracture of unspecified phalanx or phalanges of hand [S62.609A] 10/04/2013  . Generalized anxiety disorder [F41.1] 08/30/2013  . Anxiety disorder [F41.9] 08/29/2013  . Admitted with dehydration [Z78.9] 08/29/2013  . Gastroenteritis [K52.9] 08/29/2013  . Malnutrition of mild degree ([place]) [E44.1] 08/29/2013  . Major depressive disorder, recurrent episode, severe ([place]) [F33.2] 07/14/2013  . HYPOGONADISM [E29.1] 08/28/2009  . 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 Diabetes type 2, uncontrolled ([place]) [E11.65] 09/30/2006   . HYPERLIPIDEMIA [E78.5] 09/30/2006  . TOBACCO ABUSE [F17.200] 09/30/2006  . Essential hypertension [I10] 09/30/2006  . ALLERGIC RHINITIS [J30.9] 09/30/2006  . COPD (chronic obstructive pulmonary disease) ([place]) [J44.9] 09/30/2006  . GERD [K21.9] 09/30/2006  . 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 Backache [M54.9] 09/30/2006  . FIBROMYALGIA [IMO0001] 09/30/2006  . FATTY LIVER DISEASE, HX OF [Z87.19] 09/30/2006   Subjective:[name]. is an 40 y.o. male who is feeling increasingly depressed, anxious, and suicidal presents to observation unit for observation and stablization.   History of Present Illness: [name]. is an 40 y.o. male presenting to [facility]. Patient referred to [facility] by his outpatient psychiatrist (Dr. Adele Schilder) located at [facility] Outpatient Department. Patient sts that he met with Dr. Adele Schilder for a face to face outpatient appointment today. Patient reported to Dr. Adele Schilder during the outpatient appointment that he was feeling increasingly depressed, anxious, and suicidal. Patient was told to come to [facility] for medical clearance and a TTS assessment for inpatient placement. Patient did in fact comply and brought himself to [facility]. Writer met with patient to complete a TTS assessment.   Patient with c/o of  Increased anxiety and flight of ideas. Patient asked this writer prior to starting the assessment if he could have some medication to assist him with the anxiety. Patient appeared frigidity. Nursing staff called to tend to patient's needs. Patient explained to his nurse how he was feeling. Patient continued to  describe thoughts of hopelessness, fatigue, racing thoughts, and crying spells, loss of interest in usual pleasures, and despondence. Onset of symptoms started 1-1.5 month ago with crying spells and vegetative symptoms (laying in the bed). He has no desire of going outside as this increases his anxiety. He speaks of having  difficulty going into grocery store because he doesn't like to be around people.   Patient reports suicidal thoughts with no plan. He also has no intent but he is not sure he can contract for safety. He has no history of self mutilating behaviors. He does have a history of 2 prior suicide attempts (all overdoses). The suicide attempts were triggered by being off his medications or medications not working properly and conflict within marriage. Stressors include relationship conflict with ex-fiance and recently obtaining custody of his daughter.   Patient denies HI. He is currently cooperative but anxious. He does report several legal charges (Assault on a male, trespassing, stopping from calling law enforcement, 50-B, etc.). All the charges are related to his ex-fiance. Patient has 3 upcoming court dates.   Patient has received inpatient treatment at [facility] in the past (approx. 4x's). All admissions were related to suicide attempts, depression, Bipolar Disorder, Mania, etc. Patient sts that he is not only seeking current outpatient treatment with Dr. Adele Schilder at [facility] but he regularly participates in NA group meetings.   Patient denies current alcohol and drug use. However, he does report a history of cocaine use. Sts that his last cocaine usage was 9 months ago.   Patient is a recently divorced and Caucasian male. He has been living alone until 2-3 weeks ago (daughter now in household-38 yrs old). Patient has a GED. He is currently employed at [employer]  OBS Unit:  [name] [employer]hu and chart reviewed.  [name] is alert and oriented x4, to person, place, time and situation. Patient has good eye contact, spoke in a soft clear tone with slow pace. His thought process is coherent and relevant.  His mood is depressed and anxious.  Patient endorses SI with no plan but denies HI/AVH. Patient denies delusion or paranoia.  Patient  does not appear to be responding to internal stimuli.   Patient reports depressive symptoms for the past few weeks that is disabling.  Patient reports overwhelming and intense feelings of sadness, racing thoughts, fatigue,  vegetative state (laying in bed all day), no energy, crying spells, loss of interest in usual pleasures, no motivation and insomnia.  Patient reports he has not been able to go to work for a couple of days, all he does is lay in bed.  Patient reports he has no energy or motivation to do simple things as getting out of bed or getting dressed.  Patient reports starting to neglect his hygiene. Patient reports high social anxiety with grocery shopping or being around people.  Patient reports compliance with his medication but expresses frustrations that depressive symptoms continue to get worse.     Associated Signs/Symptoms: Depression Symptoms:  depressed mood, anhedonia, insomnia, psychomotor retardation, fatigue, feelings of worthlessness/guilt, difficulty concentrating, hopelessness, suicidal thoughts without plan, anxiety, loss of energy/fatigue, decreased appetite, (Hypo) Manic Symptoms:  Flight of Ideas, Anxiety Symptoms:  Social Anxiety, Psychotic Symptoms:  None reported PTSD Symptoms: None reported Total Time spent with patient: 30 minutes  Past Psychiatric History:See HPI  Risk to Self: Is patient at risk for suicide?: Yes Risk to Others:   Prior Inpatient Therapy:   Prior Outpatient Therapy:    Alcohol Screening: 1. How often do you have a drink containing alcohol?: Never 9. Have you or someone else been injured as a result of your drinking?: No 10. Has a relative or friend or a doctor or another health worker been concerned about your drinking or suggested you cut down?: No  Alcohol Use Disorder Identification Test Final Score (AUDIT): 0 Brief Intervention: AUDIT score less than 7 or less-screening does not suggest unhealthy drinking-brief intervention not indicated Substance Abuse History in the last 12  months:  Yes.   Consequences of Substance Abuse: Legal Consequences:  court dates for pending legal charges Family Consequences:  relationship conflicts  Previous Psychotropic Medications: Yes  Psychological Evaluations: No  Past Medical History:  Past Medical History  Diagnosis Date  . Allergy   . Depression   . GERD (gastroesophageal reflux disease)   . Hyperlipidemia   . Chronic pain disorder     Sees [facility]  . Urinary incontinence     detrusor instability  . Hypogonadism   . Diabetes mellitus     Type II  . Chronic neck pain   . Bipolar depression ([place])   . Opiate dependence, continuous ([place])   . Polysubstance abuse   . Drug-seeking behavior     Past Surgical History  Procedure Laterality Date  . Appendectomy    . Hernia repair    . Nasal sinus surgery  2008   Family History:  Family History  Problem Relation Age of Onset  . Diabetes Mother   . Hypertension Mother   . Cirrhosis Mother   . Depression Mother   . Bipolar disorder Mother   . Arthritis Father 76    osteoarthritis  . Bipolar disorder Father   . Diabetes Sister     borderline  . Heart disease Paternal Uncle 47    MI  . Heart disease Maternal Grandfather     late 60's--MI  . Cancer Paternal Grandmother 60    lung  . Bipolar disorder Paternal Grandmother   . Heart disease Paternal Grandfather 10    MI  . Bipolar disorder Paternal Grandfather    Family Psychiatric  History: See HPI Social History:  History  Alcohol Use No     History  Drug Use No    Social History   Social History  . Marital Status: Legally Separated    Spouse Name: N/A  . Number of Children: 2  . Years of Education: N/A   Occupational History  . Disabled     chronic pain   Social History Main Topics  . Smoking status: Current Every Day Smoker -- 2.50 packs/day    Types: Cigarettes  . Smokeless tobacco: Never Used  . Alcohol Use: No  . Drug Use: No  . Sexual Activity: Not Asked   Other  Topics Concern  . None   Social History Narrative   Additional Social History:    Pain Medications: SEE MAR Prescriptions: SEE MAR Over the Counter: SEE MAR History of alcohol / drug use?: Yes Longest period of sobriety (when/how long): 10-11 yrs Negative Consequences of Use: Personal relationships Name of Substance 1: Cocaine  1 - Age of First Use: 40 yrs old  1 - Amount (size/oz): n/a 1 - Frequency: n/a 1 - Duration: n/a 1 - Last Use / Amount: 9 months ago                  Allergies:   Allergies  Allergen Reactions  . Ciprofloxacin Hives  . Hydrocodone Itching    Rash and nausea Rash and nausea   Lab Results:  Results for orders placed or performed during the hospital encounter of 11/15/15 (from the past 48 hour(s))  Glucose, capillary     Status: Abnormal   Collection Time: 11/15/15  9:35 PM  Result Value Ref Range   Glucose-Capillary 215 (H) 65 - 99 mg/dL    Metabolic Disorder Labs:  Lab Results  Component Value Date   HGBA1C 8.2 11/08/2015   MPG 220 08/04/2015   MPG 223 08/03/2015   No results found for: PROLACTIN Lab Results  Component Value Date   CHOL 167 11/08/2015   TRIG 44.0 11/08/2015   HDL 49.10 11/08/2015   CHOLHDL 3 11/08/2015   VLDL 8.8 11/08/2015   LDLCALC 109* 11/08/2015   LDLCALC 111* 08/04/2015    Current Medications: Current Facility-Administered Medications  Medication Dose Route Frequency Provider Last Rate Last Dose  . acetaminophen (TYLENOL) tablet 650 mg  650 mg Oral Q6H PRN Delfin Gant, NP      . alum & mag hydroxide-simeth (MAALOX/MYLANTA) 200-200-20 MG/5ML suspension 30 mL  30 mL Oral Q4H PRN Delfin Gant, NP      . Derrill Memo ON 11/16/2015] DULoxetine (CYMBALTA) DR capsule 60 mg  60 mg Oral Daily Harriet Butte, NP      . Derrill Memo ON 11/16/2015] fluticasone (FLONASE) 50 MCG/ACT nasal spray 1 spray  1 spray Each Nare Daily Harriet Butte, NP      . Derrill Memo ON 11/16/2015] Fluticasone Furoate-Vilanterol 100-25 MCG/INH  AEPB 1 puff  1 puff Inhalation Daily Harriet Butte, NP      . Derrill Memo ON 11/16/2015] gabapentin (NEURONTIN) tablet 800 mg  800 mg Oral TID Harriet Butte, NP      . hydrOXYzine (ATARAX/VISTARIL) tablet 50 mg  50 mg Oral TID PRN Harriet Butte, NP      . Derrill Memo ON 11/16/2015] insulin aspart (novoLOG) injection 0-15 Units  0-15 Units Subcutaneous TID WC Harriet Butte, NP      . Derrill Memo ON 11/16/2015] insulin aspart (novoLOG) injection 8 Units  8 Units Subcutaneous QHS Harriet Butte, NP   5 Units at 11/15/15 2230  . ipratropium-albuterol (DUONEB) 0.5-2.5 (3) MG/3ML nebulizer solution 3 mL  3 mL Nebulization Q4H PRN Harriet Butte, NP      . Derrill Memo ON 11/16/2015] lamoTRIgine (LAMICTAL) tablet 300 mg  300 mg Oral Daily Harriet Butte, NP      . Derrill Memo ON 11/16/2015] loratadine (CLARITIN) tablet 10 mg  10 mg Oral Daily Harriet Butte, NP      . Derrill Memo ON 11/16/2015] lurasidone (LATUDA) tablet 40 mg  40 mg Oral Q breakfast Harriet Butte, NP      . magnesium hydroxide (MILK OF MAGNESIA) suspension 30 mL  30 mL Oral Daily PRN Delfin Gant, NP      . Derrill Memo ON 11/16/2015] mirabegron ER (MYRBETRIQ) tablet 50 mg  50 mg Oral Daily Harriet Butte, NP      . nicotine (NICODERM CQ - dosed in mg/24 hours) patch 21 mg  21 mg Transdermal Daily Hampton Abbot, MD   21 mg at 11/15/15 2245  . [START ON 11/16/2015] pioglitazone (ACTOS) tablet 30 mg  30 mg Oral Daily Harriet Butte, NP      . QUEtiapine (SEROQUEL) tablet 100 mg  100 mg Oral QHS Harriet Butte, NP   100 mg at 11/15/15 2219  . [START ON 11/16/2015] ramipril (ALTACE) capsule 2.5 mg  2.5 mg Oral Daily Harriet Butte, NP      . simvastatin (ZOCOR) tablet 10 mg  10 mg Oral QHS Harriet Butte, NP   10 mg at 11/15/15 2218   PTA Medications: Prescriptions prior to admission  Medication  Sig Dispense Refill Last Dose  . cetirizine (ZYRTEC) 10 MG tablet Take 1 tablet (10 mg total) by mouth daily. For allergies   11/15/2015 at Unknown time  . DULoxetine (CYMBALTA)  60 MG capsule TAKE 1 CAPSULE EVERY DAY 30 capsule 0 Past Week at Unknown time  . fluticasone (FLONASE) 50 MCG/ACT nasal spray Place 1 spray into both nostrils daily. 16 g 0 11/15/2015 at Unknown time  . Fluticasone Furoate-Vilanterol (BREO ELLIPTA) 100-25 MCG/INH AEPB Inhale 1 puff into the lungs daily.   11/15/2015 at Unknown time  . gabapentin (NEURONTIN) 800 MG tablet Take 1 tablet (800 mg total) by mouth 3 (three) times daily. 90 tablet 0 11/15/2015 at 1000  . hydrOXYzine (ATARAX/VISTARIL) 50 MG tablet Take 50 mg by mouth 3 (three) times daily as needed for anxiety.   11/15/2015 at Unknown time  . insulin aspart (NOVOLOG) 100 UNIT/ML injection Use max 58 units with V-go pump 20 mL 2 11/15/2015 at Unknown time  . Insulin Disposable Pump (V-GO 20) KIT USE AS DIRECTED. 1 kit 2 11/15/2015 at Unknown time  . ipratropium-albuterol (DUONEB) 0.5-2.5 (3) MG/3ML SOLN Take 3 mLs by nebulization every 4 (four) hours as needed (for wheezing/shortness of breath).   Past Week at Unknown time  . lamoTRIgine (LAMICTAL) 150 MG tablet TAKE 2 TABLETS EVERY DAY 60 tablet 0 11/15/2015 at Unknown time  . lurasidone (LATUDA) 40 MG TABS tablet TAKE 1 TABLET EVERY DAY WTIH BREAKFAST 30 tablet 0 11/15/2015 at Unknown time  . mirabegron ER (MYRBETRIQ) 50 MG TB24 tablet Take 1 tablet (50 mg total) by mouth daily. 30 tablet 0 11/15/2015 at Unknown time  . pioglitazone (ACTOS) 30 MG tablet TAKE 1 TABLET (30 MG TOTAL) BY MOUTH DAILY. 30 tablet 2 11/15/2015 at Unknown time  . ramipril (ALTACE) 2.5 MG capsule Take 1 capsule (2.5 mg total) by mouth daily. 30 capsule 0 11/14/2015 at Unknown time  . simvastatin (ZOCOR) 10 MG tablet Take 1 tablet (10 mg total) by mouth at bedtime. 30 tablet 0 11/14/2015 at Unknown time  . traZODone (DESYREL) 100 MG tablet Take 1 tablet (100 mg total) by mouth at bedtime. 30 tablet 0 11/14/2015 at Unknown time    Musculoskeletal: Strength & Muscle Tone: within normal limits Gait & Station: normal Patient leans:  Right  Psychiatric Specialty Exam: Physical Exam  ROS  Blood pressure 130/82, pulse 77, temperature 98.4 F (36.9 C), temperature source Oral, resp. rate 16, height '6\' 1"'  (1.854 m), weight 91.627 kg (202 lb), SpO2 100 %.Body mass index is 26.66 kg/(m^2).  General Appearance: Disheveled  Eye Sport and exercise psychologist::  Fair  Speech:  Clear and Coherent and Slow  Volume:  Decreased  Mood:  Depressed and Hopeless  Affect:  Congruent and Depressed  Thought Process:  Coherent, Goal Directed and Logical  Orientation:  Full (Time, Place, and Person)  Thought Content:  WDL  Suicidal Thoughts:  Yes.  without intent/plan  Homicidal Thoughts:  No  Memory:  Immediate;   Good Recent;   Good Remote;   Good  Judgement:  Fair  Insight:  Fair  Psychomotor Activity:  Normal  Concentration:  Good  Recall:  Good  Fund of Knowledge:Good  Language: Good  Akathisia:  Negative  Handed:  Right  AIMS (if indicated):     Assets:  Communication Skills Desire for [place] Transportation  ADL's:  Intact  Cognition: WNL  Sleep:   Sleeping only 2-4 hours a night    Observation Level/Precautions:  Continuous Observation  Laboratory:  Per EDP  Psychotherapy:    Medications:    Consultations:    Discharge Concerns:    Estimated LOS:  Other:     Treatment Plan Summary: Daily contact with patient to assess and evaluate symptoms and progress in treatment, Medication management  Psychotherapy Plan admit [facility] when bed becomes available.  Disposition Patient meets psychiatric inpatient criteria. Admit to [facility] when bed becomes available.   Samantha Crimes, PMHNP-BC 1/5/201711:32 PM

## 2015-11-15 NOTE — ED Notes (Signed)
Report called to Obs Unit and [facility]

## 2015-11-15 NOTE — Progress Notes (Signed)
[facility] INPATIENT:  Family/Significant Other Suicide Prevention Education  Suicide Prevention Education:  Patient Refusal for Family/Significant Other Suicide Prevention Education: The patient Timothy MiresJeffrey L Belson Jr. has refused to provide written consent for family/significant other to be provided Family/Significant Other Suicide Prevention Education during admission and/or prior to discharge.  Physician notified.  Wandra MannanKimberly R Edric Fetterman 11/15/2015, 11:28 PM

## 2015-11-15 NOTE — Progress Notes (Signed)
Patient is alert and oriented x 4. Patient denies HI/AVH. Patient reports having passive SI with no plan. Patient able to verbally contract with this Clinical research associatewriter. Patient states he is having 8/10 pain in his neck that is chronic. Patient offered PRN tylenol and heat pack patient refused. Patient reports, "I have been feeling really depressed, and I have not went to work all week. I have been having suicidal thoughts but I don't have a plan. I feel like my medications are not working for my bipolar like they have before." Patient states he has been clean off drugs for 9 months. Patient UDS is clear. Skin assessment preformed, no areas of concern. Patient did have a disposable  Insulin pump on and was removed and placed in sharps box. Patient reports he smokes 3 pack of cigarettes a day, order for nicotine 21 mg patch placed on right arm. CBG obtained, blood sugar was 215, 5 units of novolog given. Patient is currently resting in bed.

## 2015-11-15 NOTE — ED Provider Notes (Signed)
CSN: [id number]     Arrival date & time 11/15/15  1623 History   First MD Initiated Contact with Patient 11/15/15 1628     No chief complaint on file.    (Consider location/radiation/quality/duration/timing/severity/associated sxs/prior Treatment) HPI Comments: Patient here with increased depression due to issues with his girlfriend. Does have a history of all alcoholism and denies drinking at this time. Also has chronic pain as well 2. Denies any active suicide plan. Patient noncompliant with seen his psychiatrist on a regular basis but dizzy today. No homicidal ideations. Was seen by his psychiatrist today and sent here for medical clearance to be admitted to the behavior health hospital.  The history is provided by the patient and medical records.    Past Medical History  Diagnosis Date  . Allergy   . Depression   . GERD (gastroesophageal reflux disease)   . Hyperlipidemia   . Chronic pain disorder     Sees [facility]  . Urinary incontinence     detrusor instability  . Hypogonadism   . Diabetes mellitus     Type II  . Chronic neck pain   . Bipolar depression ([place])   . Opiate dependence, continuous ([place])   . Polysubstance abuse   . Drug-seeking behavior    Past Surgical History  Procedure Laterality Date  . Appendectomy    . Hernia repair    . Nasal sinus surgery  2008   Family History  Problem Relation Age of Onset  . Diabetes Mother   . Hypertension Mother   . Cirrhosis Mother   . Depression Mother   . Bipolar disorder Mother   . Arthritis Father 76    osteoarthritis  . Bipolar disorder Father   . Diabetes Sister     borderline  . Heart disease Paternal Uncle 29    MI  . Heart disease Maternal Grandfather     late 60's--MI  . Cancer Paternal Grandmother 40    lung  . Bipolar disorder Paternal Grandmother   . Heart disease Paternal Grandfather 62    MI  . Bipolar disorder Paternal Grandfather    Social History  Substance Use Topics  .  Smoking status: Current Every Day Smoker -- 2.50 packs/day    Types: Cigarettes  . Smokeless tobacco: Never Used  . Alcohol Use: No    Review of Systems  All other systems reviewed and are negative.     Allergies  Ciprofloxacin and Hydrocodone  Home Medications   Prior to Admission medications   Medication Sig Start Date End Date Taking? Authorizing Provider  cetirizine (ZYRTEC) 10 MG tablet Take 1 tablet (10 mg total) by mouth daily. For allergies 08/06/15   Benjamine Mola, FNP  CIALIS 5 MG tablet Take 5 mg by mouth daily as needed. Erectile dysfunction. 07/27/15   Historical Provider, MD  doxycycline (VIBRAMYCIN) 100 MG capsule Take 1 capsule (100 mg total) by mouth 2 (two) times daily. One po bid x 7 days Patient not taking: Reported on 11/08/2015 10/31/15   Shanon Rosser, MD  DULoxetine (CYMBALTA) 60 MG capsule TAKE 1 CAPSULE EVERY DAY 10/03/15   Clarene Reamer, MD  fluticasone [facility]) 50 MCG/ACT nasal spray Place 1 spray into both nostrils daily. 08/06/15   Benjamine Mola, FNP  gabapentin (NEURONTIN) 800 MG tablet Take 1 tablet (800 mg total) by mouth 3 (three) times daily. 08/06/15   Benjamine Mola, FNP  hydrOXYzine (VISTARIL) 50 MG capsule TAKE 2 CAPSULES AT BEDTIME AS  NEEDED 10/30/15   Clarene Reamer, MD  insulin aspart (NOVOLOG) 100 UNIT/ML injection Use max 58 units with V-go pump 11/08/15   Elayne Snare, MD  Insulin Disposable Pump (V-GO 20) KIT USE AS DIRECTED. 11/08/15   Elayne Snare, MD  insulin glargine (LANTUS) 100 UNIT/ML injection Inject 0.45 mLs (45 Units total) into the skin daily. 08/06/15   Benjamine Mola, FNP  insulin lispro (HUMALOG) 100 UNIT/ML injection Use max 70 units per day with V-go pump 11/08/15   Elayne Snare, MD  ipratropium-albuterol (DUONEB) 0.5-2.5 (3) MG/3ML SOLN Take 3 mLs by nebulization every 4 (four) hours as needed (for wheezing/shortness of breath).    Historical Provider, MD  lamoTRIgine (LAMICTAL) 150 MG tablet TAKE 2 TABLETS EVERY DAY 10/03/15    Clarene Reamer, MD  lurasidone (LATUDA) 40 MG TABS tablet TAKE 1 TABLET EVERY DAY WTIH BREAKFAST 11/02/15   Clarene Reamer, MD  mirabegron ER (MYRBETRIQ) 50 MG TB24 tablet Take 1 tablet (50 mg total) by mouth daily. 08/06/15   Benjamine Mola, FNP  pioglitazone (ACTOS) 30 MG tablet TAKE 1 TABLET (30 MG TOTAL) BY MOUTH DAILY. 11/08/15   Elayne Snare, MD  ramipril (ALTACE) 2.5 MG capsule Take 1 capsule (2.5 mg total) by mouth daily. 08/06/15   Benjamine Mola, FNP  simvastatin (ZOCOR) 10 MG tablet Take 1 tablet (10 mg total) by mouth at bedtime. 08/06/15   Benjamine Mola, FNP  traZODone (DESYREL) 100 MG tablet Take 1 tablet (100 mg total) by mouth at bedtime. 10/31/15   Clarene Reamer, MD   There were no vitals taken for this visit. Physical Exam  Constitutional: He is oriented to person, place, and time. He appears well-developed and well-nourished.  Non-toxic appearance. No distress.  HENT:  Head: Normocephalic and atraumatic.  Eyes: Conjunctivae, EOM and lids are normal. Pupils are equal, round, and reactive to light.  Neck: Normal range of motion. Neck supple. No tracheal deviation present. No thyroid mass present.  Cardiovascular: Normal rate, regular rhythm and normal heart sounds.  Exam reveals no gallop.   No murmur heard. Pulmonary/Chest: Effort normal and breath sounds normal. No stridor. No respiratory distress. He has no decreased breath sounds. He has no wheezes. He has no rhonchi. He has no rales.  Abdominal: Soft. Normal appearance and bowel sounds are normal. He exhibits no distension. There is no tenderness. There is no rebound and no CVA tenderness.  Musculoskeletal: Normal range of motion. He exhibits no edema or tenderness.  Neurological: He is alert and oriented to person, place, and time. He has normal strength. No cranial nerve deficit or sensory deficit. GCS eye subscore is 4. GCS verbal subscore is 5. GCS motor subscore is 6.  Skin: Skin is warm and dry. No abrasion and no  rash noted.  Psychiatric: His affect is blunt. His speech is delayed. He is slowed. He expresses no suicidal plans and no homicidal plans.  Nursing note and vitals reviewed.   ED Course  Procedures (including critical care time) Labs Review Labs Reviewed  ETHANOL  URINE RAPID DRUG SCREEN, HOSP PERFORMED  CBC WITH DIFFERENTIAL/PLATELET  COMPREHENSIVE METABOLIC PANEL  SALICYLATE LEVEL  ACETAMINOPHEN LEVEL  URINALYSIS, ROUTINE W REFLEX MICROSCOPIC (NOT AT [facility])  CBG MONITORING, ED    Imaging Review No results found. I have personally reviewed and evaluated these images and lab results as part of my medical decision-making.   EKG Interpretation None      MDM   Final diagnoses:  None    Patient will be medically cleared and then seen by TTS for placement to behavior health    Lacretia Leigh, MD 11/15/15 [phone]

## 2015-11-15 NOTE — Progress Notes (Signed)
[facility]. Rosey BathKelly Vianka Ertel, RN

## 2015-11-15 NOTE — ED Notes (Signed)
Per pt, states racing thoughts, depression-psyche sent him here for eval

## 2015-11-15 NOTE — Progress Notes (Signed)
[name] [phone] Progress Note  [name] [id number] 40 y.o.  11/15/2015 4:10 PM  Chief Complaint:  I am very depressed.  I need help.  I have a lot of stress.  History of Present Illness:  [name] came today as a walk-in as he is feeling very depressed and stressed out. He was last seen on September 30.  He admitted noncompliant with follow up because he is under a lot of stress.  He split up with his girlfriend who filed charges against him for assault.  She also have a 50 B on him and he is very scared to get arrested.  Patient appears tearful, crying, emotional and admitted that he having suicidal thoughts.  He wants to get admitted.  Though he denies any hallucination or paranoia but admitted poor sleep, crying spells, anhedonia, feeling of hopelessness or worthlessness.  He has not going to [employer].  When I ask if he is drinking or using drugs he denied but it appears that he has alcohol on his breath.  Patient like to get treatment and I offer voluntary inpatient treatment which he accepted.  Suicidal Ideation: Yes Plan Formed: No Patient has means to carry out plan: No  Homicidal Ideation: No Plan Formed: No Patient has means to carry out plan: No  Past Psychiatric History/Hospitalization(s) Patient has multiple psychiatric hospital admission. His last psychiatric hospitalization was September 2016 at behavioral [facility] earlier than that he was admitted twice in 2015. He has a history of heavy drinking, using cocaine and drugs.  In the past he has hospitalization at [facility].  He has history of suicidal thinking and overdose on Klonopin.  He endorsed history of paranoia, anger , substance use , mood swings anger and irritability.  He had tried Depakote, BuSpar, Risperdal, Prozac and recently Seroquel.  He endorsed increased prolactin level which could be due to Risperdal .  He has history of using Xanax, Valium and Klonopin.   Anxiety:  Yes Bipolar Disorder: Yes Depression: Yes Mania: Yes Psychosis: Yes Schizophrenia: No Personality Disorder: No Hospitalization for psychiatric illness: Yes History of Electroconvulsive Shock Therapy: No Prior Suicide Attempts: Yes  Medical History; Patient has multiple medical problems.  He has hypertension, diabetes mellitus , asthma, GERD, chronic pain, degenerative disc disease, fibromyalgia, low testosterone, hyperlipidemia and allergic rhinitis.  His primary care physician is Dr. Conley Canal and he see Dr. Dwyane Dee for her diabetes.  Review of Systems  Constitutional: Negative.  Negative for weight loss.  Cardiovascular: Negative for chest pain and palpitations.  Gastrointestinal: Negative.   Musculoskeletal: Negative.   Skin: Negative for itching and rash.  Neurological: Positive for dizziness and headaches. Negative for tremors.  Psychiatric/Behavioral: Positive for depression, suicidal ideas and substance abuse. Negative for hallucinations. The patient is nervous/anxious and has insomnia.      Psychiatric: Agitation: No Hallucination: No Depressed Mood: Yes Insomnia: Yes Hypersomnia: No Altered Concentration: No Feels Worthless: Yes Grandiose Ideas: No Belief In Special Powers: No New/Increased Substance Abuse: Yes Compulsions: No  Neurologic: Headache: No Seizure: No Paresthesias: No    Outpatient Encounter Prescriptions as of 11/15/2015  Medication Sig  . cetirizine (ZYRTEC) 10 MG tablet Take 1 tablet (10 mg total) by mouth daily. For allergies  . CIALIS 5 MG tablet Take 5 mg by mouth daily as needed. Erectile dysfunction.  Marland Kitchen doxycycline (VIBRAMYCIN) 100 MG capsule Take 1 capsule (100 mg total) by mouth 2 (two) times daily. One po bid x 7 days (Patient  not taking: Reported on 11/08/2015)  . DULoxetine (CYMBALTA) 60 MG capsule TAKE 1 CAPSULE EVERY DAY  . fluticasone (FLONASE) 50 MCG/ACT nasal spray Place 1 spray into both nostrils daily.  Marland Kitchen gabapentin (NEURONTIN)  800 MG tablet Take 1 tablet (800 mg total) by mouth 3 (three) times daily.  . hydrOXYzine (VISTARIL) 50 MG capsule TAKE 2 CAPSULES AT BEDTIME AS NEEDED  . insulin aspart (NOVOLOG) 100 UNIT/ML injection Use max 58 units with V-go pump  . Insulin Disposable Pump (V-GO 20) KIT USE AS DIRECTED.  Marland Kitchen insulin glargine (LANTUS) 100 UNIT/ML injection Inject 0.45 mLs (45 Units total) into the skin daily.  . insulin lispro (HUMALOG) 100 UNIT/ML injection Use max 70 units per day with V-go pump  . ipratropium-albuterol (DUONEB) 0.5-2.5 (3) MG/3ML SOLN Take 3 mLs by nebulization every 4 (four) hours as needed (for wheezing/shortness of breath).  . lamoTRIgine (LAMICTAL) 150 MG tablet TAKE 2 TABLETS EVERY DAY  . lurasidone (LATUDA) 40 MG TABS tablet TAKE 1 TABLET EVERY DAY WTIH BREAKFAST  . mirabegron ER (MYRBETRIQ) 50 MG TB24 tablet Take 1 tablet (50 mg total) by mouth daily.  . pioglitazone (ACTOS) 30 MG tablet TAKE 1 TABLET (30 MG TOTAL) BY MOUTH DAILY.  . ramipril (ALTACE) 2.5 MG capsule Take 1 capsule (2.5 mg total) by mouth daily.  . simvastatin (ZOCOR) 10 MG tablet Take 1 tablet (10 mg total) by mouth at bedtime.  . traZODone (DESYREL) 100 MG tablet Take 1 tablet (100 mg total) by mouth at bedtime.   No facility-administered encounter medications on file as of 11/15/2015.    Recent Results (from the past 2160 hour(s))  POCT HgB A1C     Status: Abnormal   Collection Time: 11/08/15 11:06 AM  Result Value Ref Range   Hemoglobin A1C 8.2   Comp Met (CMET)     Status: Abnormal   Collection Time: 11/08/15 11:25 AM  Result Value Ref Range   Sodium 136 135 - 145 mEq/L   Potassium 4.4 3.5 - 5.1 mEq/L   Chloride 102 96 - 112 mEq/L   CO2 26 19 - 32 mEq/L   Glucose, Bld 112 (H) 70 - 99 mg/dL   BUN 8 6 - 23 mg/dL   Creatinine, Ser 0.88 0.40 - 1.50 mg/dL   Total Bilirubin 0.4 0.2 - 1.2 mg/dL   Alkaline Phosphatase 85 39 - 117 U/L   AST 23 0 - 37 U/L   ALT 20 0 - 53 U/L   Total Protein 7.2 6.0 - 8.3 g/dL    Albumin 4.4 3.5 - 5.2 g/dL   Calcium 9.7 8.4 - 10.5 mg/dL   GFR 101.43 >60.00 mL/min  Lipid panel     Status: Abnormal   Collection Time: 11/08/15 11:25 AM  Result Value Ref Range   Cholesterol 167 0 - 200 mg/dL    Comment: ATP III Classification       Desirable:  < 200 mg/dL               Borderline High:  200 - 239 mg/dL          High:  > = 240 mg/dL   Triglycerides 44.0 0.0 - 149.0 mg/dL    Comment: Normal:  <150 mg/dLBorderline High:  150 - 199 mg/dL   HDL 49.10 >39.00 mg/dL   VLDL 8.8 0.0 - 40.0 mg/dL   LDL Cholesterol 109 (H) 0 - 99 mg/dL   Total CHOL/HDL Ratio 3     Comment:  Men          Women1/2 Average Risk     3.4          3.3Average Risk          5.0          4.42X Average Risk          9.6          7.13X Average Risk          15.0          11.0                       NonHDL 117.74     Comment: NOTE:  Non-HDL goal should be 30 mg/dL higher than patient's LDL goal (i.e. LDL goal of < 70 mg/dL, would have non-HDL goal of < 100 mg/dL)  Microalbumin / creatinine urine ratio     Status: None   Collection Time: 11/08/15 11:25 AM  Result Value Ref Range   Microalb, Ur <0.7 0.0 - 1.9 mg/dL   Creatinine,U 37.6 mg/dL   Microalb Creat Ratio 1.9 0.0 - 30.0 mg/g      Physical Exam: Constitutional:  There were no vitals taken for this visit.  Musculoskeletal: Strength & Muscle Tone: within normal limits Gait & Station: normal Patient leans: N/A  Mental Status Examination;  Patient is very tearful, emotional and having crying spells.  He described his mood sad depressed anxious and his affect is constricted.  His speech is slow but coherent.  He admitted positive suicidal thoughts and feeling hopeless and worthless.  Though he denies any plan but he like to get admitted and to be in safe environment.  Denies any paranoia or any hallucination.  His energy level is low.  There were no delusions, obsessive thoughts.  His attention and concentration is poor.  His  cognition is okay.  He has no tremors or shakes.  His insight judgment and impulse control is fair.  Established Problem, Stable/Improving (1), New problem, with additional work up planned, Review of Psycho-Social Stressors (1), Review or order clinical lab tests (1), Decision to obtain old records (1), Review and summation of old records (2), Established Problem, Worsening (2), New Problem, with no additional work-up planned (3), Review of Last Therapy Session (1) and Review of Medication Regimen & Side Effects (2)  Assessment: Axis I: Bipolar disorder NOS, rule out posttraumatic stress disorder  Axis II: Deferred  Axis III:  Past Medical History  Diagnosis Date  . Allergy   . Depression   . GERD (gastroesophageal reflux disease)   . Hyperlipidemia   . Chronic pain disorder     Sees [facility]  . Urinary incontinence     detrusor instability  . Hypogonadism   . Diabetes mellitus     Type II  . Chronic neck pain   . Bipolar depression ([place])   . Opiate dependence, continuous ([place])   . Polysubstance abuse   . Drug-seeking behavior    Plan:  I do believe patient is decompensating from the past.  There is a questionable history of compliance and relapsed into drinking.  I offer voluntary inpatient treatment which he accepted.  I called Dr. Lennette Bihari [place] in the emergency room to discuss patient's condition and he agreed to have medical clearance and inpatient treatment.  I also spoke to [facility] in psychiatric emergency room for inpatient treatment.  Patient agreed with the plan.  At this time he  does not need IVC as he is willing to go by himself for voluntary inpatient treatment.  Plan discussed with the patient who agreed.  Jasimine Simms T., MD 11/15/2015
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
## 2015-11-15 NOTE — Tx Team (Signed)
Initial Interdisciplinary Treatment Plan   PATIENT STRESSORS: Health problems Marital or family conflict Medication change or noncompliance   PATIENT STRENGTHS: Ability for insight Capable of independent living Communication skills General fund of knowledge Supportive family/friends   PROBLEM LIST: Problem List/Patient Goals Date to be addressed Date deferred Reason deferred Estimated date of resolution  "I am depressed" 11/15/15     "I have not slept in 3 days" 11/15/15     "I have suicidal thoughts" 11/15/15     "I am having bouncing thought." 11/15/15                                    DISCHARGE CRITERIA:  Ability to meet basic life and health needs Improved stabilization in mood, thinking, and/or behavior Need for constant or close observation no longer present  PRELIMINARY DISCHARGE PLAN: Outpatient therapy Participate in family therapy Return to previous work or school arrangements  PATIENT/FAMIILY INVOLVEMENT: This treatment plan has been presented to and reviewed with the patient, Mirna MiresJeffrey L Sadler Jr..  The patient and family have been given the opportunity to ask questions and make suggestions.  Wandra MannanKimberly R Keishia Ground 11/15/2015, 11:09 PM

## 2015-11-15 NOTE — Plan of Care (Signed)
Patient is alert and oriented x 4. Patient denies HI/AVH. Patient reports having passive SI with no plan. Patient able to verbally contract with this writer. Patient states he is having 8/10 pain in his neck that is chronic. Patient offered PRN tylenol and heat pack patient refused. Patient reports, "I have been feeling really depressed, and I have not went to work all week. I have been having suicidal thoughts but I don't have a plan. I feel like my medications are not working for my bipolar like they have before." Patient states he has been clean off drugs for 9 months. Patient UDS is clear. Skin assessment preformed, no areas of concern. Patient did have a disposable  Insulin pump on and was removed and placed in sharps box. Patient reports he smokes 3 pack of cigarettes a day, order for nicotine 21 mg patch placed on right arm. CBG obtained, blood sugar was 215, 5 units of novolog given. Patient is currently resting in bed.  

## 2015-11-16 DIAGNOSIS — Z9641 Presence of insulin pump (external) (internal): Secondary | ICD-10-CM | POA: Diagnosis present

## 2015-11-16 DIAGNOSIS — M797 Fibromyalgia: Secondary | ICD-10-CM | POA: Diagnosis present

## 2015-11-16 DIAGNOSIS — E119 Type 2 diabetes mellitus without complications: Secondary | ICD-10-CM | POA: Diagnosis not present

## 2015-11-16 DIAGNOSIS — R45851 Suicidal ideations: Secondary | ICD-10-CM | POA: Diagnosis not present

## 2015-11-16 DIAGNOSIS — Z818 Family history of other mental and behavioral disorders: Secondary | ICD-10-CM | POA: Diagnosis not present

## 2015-11-16 DIAGNOSIS — E7800 Pure hypercholesterolemia, unspecified: Secondary | ICD-10-CM | POA: Diagnosis present

## 2015-11-16 DIAGNOSIS — M549 Dorsalgia, unspecified: Secondary | ICD-10-CM | POA: Diagnosis present

## 2015-11-16 DIAGNOSIS — F319 Bipolar disorder, unspecified: Secondary | ICD-10-CM | POA: Diagnosis not present

## 2015-11-16 DIAGNOSIS — Z8249 Family history of ischemic heart disease and other diseases of the circulatory system: Secondary | ICD-10-CM | POA: Diagnosis not present

## 2015-11-16 DIAGNOSIS — Z833 Family history of diabetes mellitus: Secondary | ICD-10-CM | POA: Diagnosis not present

## 2015-11-16 DIAGNOSIS — E785 Hyperlipidemia, unspecified: Secondary | ICD-10-CM | POA: Diagnosis present

## 2015-11-16 DIAGNOSIS — F3130 Bipolar disorder, current episode depressed, mild or moderate severity, unspecified: Secondary | ICD-10-CM | POA: Diagnosis not present

## 2015-11-16 DIAGNOSIS — G4700 Insomnia, unspecified: Secondary | ICD-10-CM | POA: Diagnosis present

## 2015-11-16 DIAGNOSIS — Z794 Long term (current) use of insulin: Secondary | ICD-10-CM | POA: Diagnosis not present

## 2015-11-16 DIAGNOSIS — F411 Generalized anxiety disorder: Secondary | ICD-10-CM | POA: Diagnosis present

## 2015-11-16 DIAGNOSIS — F17210 Nicotine dependence, cigarettes, uncomplicated: Secondary | ICD-10-CM | POA: Diagnosis present

## 2015-11-16 DIAGNOSIS — J449 Chronic obstructive pulmonary disease, unspecified: Secondary | ICD-10-CM | POA: Diagnosis present

## 2015-11-16 DIAGNOSIS — I10 Essential (primary) hypertension: Secondary | ICD-10-CM | POA: Diagnosis not present

## 2015-11-16 DIAGNOSIS — K219 Gastro-esophageal reflux disease without esophagitis: Secondary | ICD-10-CM | POA: Diagnosis present

## 2015-11-16 LAB — GLUCOSE, CAPILLARY
Glucose-Capillary: 259 mg/dL — ABNORMAL HIGH (ref 65–99)
Glucose-Capillary: 281 mg/dL — ABNORMAL HIGH (ref 65–99)
Glucose-Capillary: 185 mg/dL — ABNORMAL HIGH (ref 65–99)

## 2015-11-16 MED ORDER — ZOLPIDEM TARTRATE 5 MG PO TABS
10.0000 mg | ORAL_TABLET | Freq: Once | ORAL | Status: AC
  Administered 2015-11-16: 10 mg via ORAL
  Filled 2015-11-16: qty 2

## 2015-11-16 MED ORDER — INSULIN ASPART 100 UNIT/ML ~~LOC~~ SOLN
2.0000 [IU] | Freq: Every day | SUBCUTANEOUS | Status: DC
  Administered 2015-11-16: 4 [IU] via SUBCUTANEOUS
  Administered 2015-11-17: 7 [IU] via SUBCUTANEOUS
  Administered 2015-11-18: 6 [IU] via SUBCUTANEOUS
  Administered 2015-11-19: 4 [IU] via SUBCUTANEOUS
  Administered 2015-11-20: 8 [IU] via SUBCUTANEOUS

## 2015-11-16 MED ORDER — SIMVASTATIN 20 MG PO TABS
10.0000 mg | ORAL_TABLET | Freq: Every day | ORAL | Status: DC
  Administered 2015-11-16 – 2015-11-20 (×5): 10 mg via ORAL
  Filled 2015-11-16 (×2): qty 0.5
  Filled 2015-11-16: qty 1
  Filled 2015-11-16 (×4): qty 0.5

## 2015-11-16 NOTE — BHH Group Notes (Signed)
Pt attended wrap up group.  Pt is a new admit.  Pt observed and listened during group.  Caroll RancherMarjette Shirlean Berman, MHT

## 2015-11-16 NOTE — Progress Notes (Addendum)
Patient was sleeping at 0200. Ambien was not given at that time. Patient woke up at 0315 one time dose was given then at 0316. Patient slept off and on after Ambien was given. Patient has asked multiple times to go smoke a cigarette. Patient was re-educated this is a smoke free facility and there is no smoking. Patient has nicotine 21 mg patch on right arm that was placed at 2245. Will continue to monitor.

## 2015-11-16 NOTE — Progress Notes (Signed)
Patient is sleeping but easily awakens, is appropriate but very depresssed affect. Is compliant with medications and does deny SI/HI/AVH but states his depression is "no better" than when he came in. Patient's affect is depressed and sad, very little eye contact and communicaton is very minimal, only enough to answer questions. Nurse providing emotional support, therapeutic communication, and administering meds as ordered except for the medication patient is to bring from home which he states he believes he can get somebody to do. Nurse also ensuring continuous observation except when patient in bathroom for safety. Patient remains safe on unit, mostly sleeping.

## 2015-11-16 NOTE — Progress Notes (Signed)
D) Pt admitted to the unit from the OBS unit. Affect is flat and Pt is tearful. States, "I always have trouble when the time changes, right through Christmas and into the beginning of spring". Sad about some of the choices he has made. Denies SI and HI presently.  A) Oriented to the unit and proved with a 1:1. Given support and praise, along with encouragement. R) Adjusting  to the unit without difficulty. [name] CBG at dinner is recorded and Pt was given sliding scale insulin to cover.

## 2015-11-16 NOTE — BHH Counselor (Signed)
This Probation officer met with pt at bedside where pt openly discussed his reason for admission. Pt sts that he has experienced increased depression due to issues with his girlfriend and has a history of alcoholism. Pt denies HI/AVH, and has passive SI with no plan. Pt currently sees Dr. Adele Schilder at the [place] [facility]. Pt will be transferred to [facility] Adult Inpatient unit when a bed is available on 11/16/2015 to address his mental health needs.  Redmond Pulling, MA

## 2015-11-17 LAB — GLUCOSE, CAPILLARY
GLUCOSE-CAPILLARY: 189 mg/dL — AB (ref 65–99)
Glucose-Capillary: 325 mg/dL — ABNORMAL HIGH (ref 65–99)
Glucose-Capillary: 251 mg/dL — ABNORMAL HIGH (ref 65–99)
Glucose-Capillary: 309 mg/dL — ABNORMAL HIGH (ref 65–99)

## 2015-11-17 MED ORDER — CYCLOBENZAPRINE HCL 10 MG PO TABS
10.0000 mg | ORAL_TABLET | Freq: Two times a day (BID) | ORAL | Status: DC
  Administered 2015-11-18 – 2015-11-21 (×7): 10 mg via ORAL
  Filled 2015-11-17 (×10): qty 1

## 2015-11-17 MED ORDER — QUETIAPINE FUMARATE 50 MG PO TABS
150.0000 mg | ORAL_TABLET | Freq: Every day | ORAL | Status: DC
  Administered 2015-11-17: 150 mg via ORAL
  Filled 2015-11-17 (×4): qty 1

## 2015-11-17 MED ORDER — CYCLOBENZAPRINE HCL 5 MG PO TABS
7.5000 mg | ORAL_TABLET | Freq: Two times a day (BID) | ORAL | Status: DC
Start: 2015-11-17 — End: 2015-11-17
  Filled 2015-11-17 (×2): qty 1.5

## 2015-11-17 MED ORDER — IBUPROFEN 600 MG PO TABS
600.0000 mg | ORAL_TABLET | Freq: Four times a day (QID) | ORAL | Status: DC | PRN
  Administered 2015-11-17 – 2015-11-21 (×7): 600 mg via ORAL
  Filled 2015-11-17 (×8): qty 1

## 2015-11-17 MED ORDER — LURASIDONE HCL 40 MG PO TABS
40.0000 mg | ORAL_TABLET | Freq: Two times a day (BID) | ORAL | Status: DC
  Administered 2015-11-17 – 2015-11-21 (×8): 40 mg via ORAL
  Filled 2015-11-17 (×11): qty 1

## 2015-11-17 NOTE — Progress Notes (Signed)
Pt did not attend wrap up group meeting.  

## 2015-11-17 NOTE — Progress Notes (Signed)
D) Pt has been attending the groups and interacting with his peers. Mood is depressed and affect is flat. Pt is easily being moved to tears when talking about his issues and being alone. Does have custody of his 42 year old daughter whom he feels great responsibility. Pt rates his depression at a 9, hopelessness at a 9 and his anxiety at an 8. Pt denies SI and HI. Pt worried about having a letter written from the social worker to go to his lawyer as well as the hospital contacting the courts on Monday. Pt is scheduled to be in court on Monday. A) Pt given support, reassurance and praise. Encouragement given. Pt having a hard time but has been attending the groups and trying to work the program. R) Pt denies SI and HI

## 2015-11-17 NOTE — H&P (Signed)
Psychiatric Admission Assessment Adult  Patient Identification: [name]. MRN:  [id number] Date of Evaluation:  11/17/2015 Chief Complaint:  BIpolar Disorder Principal Diagnosis: Bipolar I disorder, most recent episode depressed ([place]) Diagnosis:   Patient Active Problem List   Diagnosis Date Noted  . Bipolar 1 disorder, depressed ([place]) [F31.9] 11/15/2015  . Bipolar disorder with depression ([place]) [F31.30] 07/31/2015  . Insomnia [G47.00]   . Sinusitis, acute maxillary [J01.00] 02/06/2015  . Headache syndrome [G44.89] 02/06/2015  . Atypical pneumonia [J18.9] 01/18/2015  . Renal mass [N28.89] 12/20/2014  . Lung nodule [R91.1] 12/07/2014  . Muscle spasms of neck [M62.48] 10/17/2014  . Bipolar I disorder, most recent episode depressed ([place]) [F31.30]   . Alcohol dependence with alcohol-induced mood disorder ([place]) [F10.24]   . MDD (major depressive disorder), severe ([place]) [F32.2] 09/19/2014  . Hyponatremia [E87.1] 08/15/2014  . Abscess [L02.91] 08/15/2014  . Loss of weight [R63.4] 05/26/2014  . Leukocytosis, unspecified [D72.829] 05/26/2014  . Rash and nonspecific skin eruption [R21] 04/21/2014  . Unspecified episodic mood disorder [F39] 03/21/2014  . Alcohol dependence ([place]) [F10.20] 03/21/2014  . Alcohol abuse [F10.10] 03/20/2014  . Cocaine abuse [F14.10] 03/20/2014  . Neck pain [M54.2] 01/31/2014  . Rotator cuff dysfunction [M75.100] 11/13/2013  . Closed fracture of unspecified phalanx or phalanges of hand [S62.609A] 10/04/2013  . Generalized anxiety disorder [F41.1] 08/30/2013  . Anxiety disorder [F41.9] 08/29/2013  . Admitted with dehydration [Z78.9] 08/29/2013  . Gastroenteritis [K52.9] 08/29/2013  . Malnutrition of mild degree ([place]) [E44.1] 08/29/2013  . Major depressive disorder, recurrent episode, severe ([place]) [F33.2] 07/14/2013  . HYPOGONADISM [E29.1] 08/28/2009  . 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 Diabetes type 2, uncontrolled ([place]) [E11.65]  09/30/2006  . HYPERLIPIDEMIA [E78.5] 09/30/2006  . TOBACCO ABUSE [F17.200] 09/30/2006  . Essential hypertension [I10] 09/30/2006  . ALLERGIC RHINITIS [J30.9] 09/30/2006  . COPD (chronic obstructive pulmonary disease) ([place]) [J44.9] 09/30/2006  . GERD [K21.9] 09/30/2006  . 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 Backache [M54.9] 09/30/2006  . FIBROMYALGIA [IMO0001] 09/30/2006  . FATTY LIVER DISEASE, HX OF [Z87.19] 09/30/2006   History of Present Illness:  Per chart records, [name]. is an 40 y.o. male presenting to [facility]. Patient referred to [facility] by his outpatient psychiatrist (Dr. Adele Schilder) located at [facility]. Patient sts that he met with Dr. Adele Schilder for a face to face outpatient appointment today. Patient reported to Dr. Adele Schilder during the outpatient appointment that he was feeling increasingly depressed, anxious, and suicidal.  Patient reports suicidal thoughts with no plan. He also has no intent but he is not sure he can contract for safety. He has no history of self mutilating behaviors. He does have a history of 2 prior suicide attempts (all overdoses). The suicide attempts were triggered by being off his medications or medications not working properly and conflict within marriage. Stressors include relationship conflict with ex-fiance and recently obtaining custody of his daughter.  He reported several legal charges (Assault on a male, trespassing and all the charges are related to his ex-fiance. Patient has 3 upcoming court dates.   Patient has received inpatient treatment at [facility] in the past (approx. 4x's). All admissions were related to suicide attempts, depression, Bipolar Disorder, Mania, etc. Patient sts that he is not only seeking current outpatient treatment with Dr. Adele Schilder at [facility] but he regularly participates in NA group meetings.   Associated Signs/Symptoms: Depression Symptoms:  depressed mood, Onset of symptoms  started 1-1.5 month ago  with crying spells and vegetative symptoms (laying in the bed). He has no desire of going outside as this increases his anxiety. He speaks of having difficulty going into grocery store because he doesn't like to be around people.  (Hypo) Manic Symptoms:  Irritable Mood, Anxiety Symptoms:  Excessive Worry, Increased anxiety and flight of ideas. Psychotic Symptoms:  NA PTSD Symptoms: NA Total Time spent with patient: 45 minutes  Past Psychiatric History: see HPI  Risk to Self: Is patient at risk for suicide?: Yes Risk to Others:   Prior Inpatient Therapy:   Prior Outpatient Therapy:    Alcohol Screening: 1. How often do you have a drink containing alcohol?: Never 9. Have you or someone else been injured as a result of your drinking?: No 10. Has a relative or friend or a doctor or another health worker been concerned about your drinking or suggested you cut down?: No Alcohol Use Disorder Identification Test Final Score (AUDIT): 0 Brief Intervention: AUDIT score less than 7 or less-screening does not suggest unhealthy drinking-brief intervention not indicated Substance Abuse History in the last 12 months:  No. Consequences of Substance Abuse: NA Previous Psychotropic Medications: Yes  Psychological Evaluations: Yes  Past Medical History:  Past Medical History  Diagnosis Date  . Allergy   . Depression   . GERD (gastroesophageal reflux disease)   . Hyperlipidemia   . Chronic pain disorder     Sees [facility]  . Urinary incontinence     detrusor instability  . Hypogonadism   . Diabetes mellitus     Type II  . Chronic neck pain   . Bipolar depression ([place])   . Opiate dependence, continuous ([place])   . Polysubstance abuse   . Drug-seeking behavior     Past Surgical History  Procedure Laterality Date  . Appendectomy    . Hernia repair    . Nasal sinus surgery  2008   Family History:  Family History  Problem Relation Age of Onset  .  Diabetes Mother   . Hypertension Mother   . Cirrhosis Mother   . Depression Mother   . Bipolar disorder Mother   . Arthritis Father 56    osteoarthritis  . Bipolar disorder Father   . Diabetes Sister     borderline  . Heart disease Paternal Uncle 45    MI  . Heart disease Maternal Grandfather     late 60's--MI  . Cancer Paternal Grandmother 20    lung  . Bipolar disorder Paternal Grandmother   . Heart disease Paternal Grandfather 31    MI  . Bipolar disorder Paternal Grandfather    Family Psychiatric  History:  See above Social History:  History  Alcohol Use No     History  Drug Use No    Social History   Social History  . Marital Status: Legally Separated    Spouse Name: N/A  . Number of Children: 2  . Years of Education: N/A   Occupational History  . Disabled     chronic pain   Social History Main Topics  . Smoking status: Current Every Day Smoker -- 2.50 packs/day    Types: Cigarettes  . Smokeless tobacco: Never Used  . Alcohol Use: No  . Drug Use: No  . Sexual Activity: Not Asked   Other Topics Concern  . None   Social History Narrative   Additional Social History:    Pain Medications: SEE MAR Prescriptions: SEE MAR Over the Counter: SEE MAR  History of alcohol / drug use?: Yes Longest period of sobriety (when/how long): 10-11 yrs Negative Consequences of Use: Personal relationships Name of Substance 1: Cocaine  1 - Age of First Use: 40 yrs old  1 - Amount (size/oz): n/a 1 - Frequency: n/a 1 - Duration: n/a 1 - Last Use / Amount: 9 months ago  Allergies:   Allergies  Allergen Reactions  . Ciprofloxacin Hives  . Hydrocodone Itching    Rash and nausea Rash and nausea   Lab Results:  Results for orders placed or performed during the hospital encounter of 11/15/15 (from the past 48 hour(s))  Glucose, capillary     Status: Abnormal   Collection Time: 11/15/15  9:35 PM  Result Value Ref Range   Glucose-Capillary 215 (H) 65 - 99 mg/dL   Glucose, capillary     Status: Abnormal   Collection Time: 11/16/15 11:36 AM  Result Value Ref Range   Glucose-Capillary 259 (H) 65 - 99 mg/dL  Glucose, capillary     Status: Abnormal   Collection Time: 11/16/15  4:38 PM  Result Value Ref Range   Glucose-Capillary 281 (H) 65 - 99 mg/dL  Glucose, capillary     Status: Abnormal   Collection Time: 11/16/15  8:53 PM  Result Value Ref Range   Glucose-Capillary 185 (H) 65 - 99 mg/dL  Glucose, capillary     Status: Abnormal   Collection Time: 11/17/15  6:26 AM  Result Value Ref Range   Glucose-Capillary 189 (H) 65 - 99 mg/dL  Glucose, capillary     Status: Abnormal   Collection Time: 11/17/15 11:56 AM  Result Value Ref Range   Glucose-Capillary 325 (H) 65 - 99 mg/dL   Comment 1 Notify RN    Comment 2 Document in Chart     Metabolic Disorder Labs:  Lab Results  Component Value Date   HGBA1C 8.2 11/08/2015   MPG 220 08/04/2015   MPG 223 08/03/2015   No results found for: PROLACTIN Lab Results  Component Value Date   CHOL 167 11/08/2015   TRIG 44.0 11/08/2015   HDL 49.10 11/08/2015   CHOLHDL 3 11/08/2015   VLDL 8.8 11/08/2015   LDLCALC 109* 11/08/2015   LDLCALC 111* 08/04/2015    Current Medications: Current Facility-Administered Medications  Medication Dose Route Frequency Provider Last Rate Last Dose  . acetaminophen (TYLENOL) tablet 650 mg  650 mg Oral Q6H PRN Delfin Gant, NP   650 mg at 11/17/15 1209  . alum & mag hydroxide-simeth (MAALOX/MYLANTA) 200-200-20 MG/5ML suspension 30 mL  30 mL Oral Q4H PRN Delfin Gant, NP      . DULoxetine (CYMBALTA) DR capsule 60 mg  60 mg Oral Daily Harriet Butte, NP   60 mg at 11/17/15 0846  . fluticasone (FLONASE) 50 MCG/ACT nasal spray 1 spray  1 spray Each Nare Daily Harriet Butte, NP   1 spray at 11/16/15 0844  . Fluticasone Furoate-Vilanterol 100-25 MCG/INH AEPB 1 puff  1 puff Inhalation Daily Harriet Butte, NP   1 puff at 11/16/15 0850  . gabapentin (NEURONTIN)  tablet 800 mg  800 mg Oral TID Harriet Butte, NP   800 mg at 11/17/15 1208  . hydrOXYzine (ATARAX/VISTARIL) tablet 50 mg  50 mg Oral TID PRN Harriet Butte, NP   50 mg at 11/17/15 0945  . insulin aspart (novoLOG) injection 0-15 Units  0-15 Units Subcutaneous TID WC Harriet Butte, NP   11 Units at 11/17/15 1212  .  insulin aspart (novoLOG) injection 2-8 Units  2-8 Units Subcutaneous QHS Royetta Asal, RPH   4 Units at 11/16/15 2221  . ipratropium-albuterol (DUONEB) 0.5-2.5 (3) MG/3ML nebulizer solution 3 mL  3 mL Nebulization Q4H PRN Harriet Butte, NP      . lamoTRIgine (LAMICTAL) tablet 300 mg  300 mg Oral Daily Harriet Butte, NP   300 mg at 11/17/15 0847  . loratadine (CLARITIN) tablet 10 mg  10 mg Oral Daily Harriet Butte, NP   10 mg at 11/17/15 0847  . lurasidone (LATUDA) tablet 40 mg  40 mg Oral Q breakfast Harriet Butte, NP   40 mg at 11/17/15 0847  . magnesium hydroxide (MILK OF MAGNESIA) suspension 30 mL  30 mL Oral Daily PRN Delfin Gant, NP      . mirabegron ER (MYRBETRIQ) tablet 50 mg  50 mg Oral Daily Harriet Butte, NP   50 mg at 11/17/15 0847  . nicotine (NICODERM CQ - dosed in mg/24 hours) patch 21 mg  21 mg Transdermal Daily Hampton Abbot, MD   21 mg at 11/17/15 0846  . pioglitazone (ACTOS) tablet 30 mg  30 mg Oral Daily Harriet Butte, NP   30 mg at 11/17/15 0846  . QUEtiapine (SEROQUEL) tablet 100 mg  100 mg Oral QHS Harriet Butte, NP   100 mg at 11/16/15 2217  . ramipril (ALTACE) capsule 2.5 mg  2.5 mg Oral Daily Harriet Butte, NP   2.5 mg at 11/17/15 0846  . simvastatin (ZOCOR) tablet 10 mg  10 mg Oral QHS Jenne Campus, MD   10 mg at 11/16/15 2321   PTA Medications: Prescriptions prior to admission  Medication Sig Dispense Refill Last Dose  . cetirizine (ZYRTEC) 10 MG tablet Take 1 tablet (10 mg total) by mouth daily. For allergies   11/15/2015 at Unknown time  . DULoxetine (CYMBALTA) 60 MG capsule TAKE 1 CAPSULE EVERY DAY 30 capsule 0 Past Week at  Unknown time  . fluticasone (FLONASE) 50 MCG/ACT nasal spray Place 1 spray into both nostrils daily. 16 g 0 11/15/2015 at Unknown time  . Fluticasone Furoate-Vilanterol (BREO ELLIPTA) 100-25 MCG/INH AEPB Inhale 1 puff into the lungs daily.   11/15/2015 at Unknown time  . gabapentin (NEURONTIN) 800 MG tablet Take 1 tablet (800 mg total) by mouth 3 (three) times daily. 90 tablet 0 11/15/2015 at 1000  . hydrOXYzine (ATARAX/VISTARIL) 50 MG tablet Take 50 mg by mouth 3 (three) times daily as needed for anxiety.   11/15/2015 at Unknown time  . insulin aspart (NOVOLOG) 100 UNIT/ML injection Use max 58 units with V-go pump 20 mL 2 11/15/2015 at Unknown time  . Insulin Disposable Pump (V-GO 20) KIT USE AS DIRECTED. 1 kit 2 11/15/2015 at Unknown time  . ipratropium-albuterol (DUONEB) 0.5-2.5 (3) MG/3ML SOLN Take 3 mLs by nebulization every 4 (four) hours as needed (for wheezing/shortness of breath).   Past Week at Unknown time  . lamoTRIgine (LAMICTAL) 150 MG tablet TAKE 2 TABLETS EVERY DAY 60 tablet 0 11/15/2015 at Unknown time  . lurasidone (LATUDA) 40 MG TABS tablet TAKE 1 TABLET EVERY DAY WTIH BREAKFAST 30 tablet 0 11/15/2015 at Unknown time  . mirabegron ER (MYRBETRIQ) 50 MG TB24 tablet Take 1 tablet (50 mg total) by mouth daily. 30 tablet 0 11/15/2015 at Unknown time  . pioglitazone (ACTOS) 30 MG tablet TAKE 1 TABLET (30 MG TOTAL) BY MOUTH DAILY. 30 tablet 2 11/15/2015 at Unknown time  .  ramipril (ALTACE) 2.5 MG capsule Take 1 capsule (2.5 mg total) by mouth daily. 30 capsule 0 11/14/2015 at Unknown time  . simvastatin (ZOCOR) 10 MG tablet Take 1 tablet (10 mg total) by mouth at bedtime. 30 tablet 0 11/14/2015 at Unknown time  . traZODone (DESYREL) 100 MG tablet Take 1 tablet (100 mg total) by mouth at bedtime. 30 tablet 0 11/14/2015 at Unknown time    Musculoskeletal: Strength & Muscle Tone: within normal limits Gait & Station: normal Patient leans: Right  Psychiatric Specialty Exam: Physical Exam  Vitals reviewed.    Review of Systems  Psychiatric/Behavioral: Positive for depression. The patient is nervous/anxious and has insomnia.   All other systems reviewed and are negative.   Blood pressure 122/77, pulse 98, temperature 97.8 F (36.6 C), temperature source Oral, resp. rate 16, height 6' 1"  (1.854 m), weight 91.627 kg (202 lb), SpO2 98 %.Body mass index is 26.66 kg/(m^2).   General Appearance: Neat  Eye Contact:: Good  Speech: Normal Rate  Volume: Normal  Mood: Anxious and Euthymic  Affect: Appropriate  Thought Process: Coherent  Orientation: Full (Time, Place, and Person)  Thought Content: Rumination  Suicidal Thoughts: Yes without intent  Homicidal Thoughts: No  Memory: Immediate; Fair Recent; Fair Remote; Fair  Judgement: Fair  Insight: Fair  Psychomotor Activity: Normal  Concentration: Good  Recall: Good  Fund of Knowledge:Good  Language: Good  Akathisia: Negative  Handed: Right  AIMS (if indicated):    Assets: Desire for Improvement Resilience Social Support  ADL's: Intact  Cognition: WNL  Sleep: Number of Hours: 7       Treatment Plan Summary: Latuda 40 mg BID mood stabilization Cymbalta 60 mg daily depression Lamictal 300 mg daily mood stabilization Seroquel 150 mg daily for mood, sleep and anxiety Flexeril 10 mg BID back pain  Observation Level/Precautions:  15 minute checks  Laboratory:  per ED  Psychotherapy:  group  Medications:  As per medlist  Consultations:  As needed  Discharge Concerns:  safety  Estimated LOS:  5-7 days  Other:     I certify that inpatient services furnished can reasonably be expected to improve the patient's condition.   Freda Munro May Agustin AGNP-BC 1/7/20173:42 PM  Patient seen face-to-face for psychiatric evaluation, case discussed with the staff and nurse practitioner and completed admission suicide risk assessment. Formulated treatment plan and reviewed the information  documented and agree with the treatment plan.  Sabir Charters,JANARDHAHA R. 11/18/2015 11:53 AM

## 2015-11-17 NOTE — BHH Group Notes (Signed)
Adult Therapy Group Note  Date:  11/17/2015 Time:  1:45-2:45 PM  Group Topic/Focus:  Managing Feelings:   The focus of this group was to identify what feelings patients have difficulty handling and develop a plan to handle them in a healthier way upon discharge.  Focus was on anger outbursts and an Anger Evaluation tool was used to elicit thought and motivate toward change.  Participation Level:  Active  Participation Quality:  Attentive, Sharing and Supportive  Affect:  Anxious, Blunted and Depressed  Cognitive:  Alert, Appropriate and Oriented  Insight: Good  Engagement in Group:  Engaged  Modes of Intervention:  Discussion and Motivational Interviewing  Additional Comments:  [name] participated very well in analyzing his own recent anger outburst along with associated values, fears, and skills used/not used.  He stated toward the end of group, after he had participated so well, that he was beating himself up for not using better coping skills, although he was also able to acknowledge that his self-directed anger was not helpful.  As a result of his report, we talked about additional thought-stopping and thought-replacement techniques.  [name], [name] 11/17/2015, 3:46 PM

## 2015-11-17 NOTE — BHH Suicide Risk Assessment (Signed)
[facility] Admission Suicide Risk Assessment   Nursing information obtained from:    Demographic factors:    Current Mental Status:    Loss Factors:    Historical Factors:    Risk Reduction Factors:    Total Time spent with patient: 1 hour Principal Problem: Bipolar I disorder, most recent episode depressed ([facility]) Diagnosis:   Patient Active Problem List   Diagnosis Date Noted  . Bipolar 1 disorder, depressed (H[facility]) [F31.9] 11/15/2015  . Bipolar disorder with depression ([facility]) [F31.30] 07/31/2015  . Insomnia [G47.00]   . Sinusitis, acute maxillary [J01.00] 02/06/2015  . Headache syndrome [G44.89] 02/06/2015  . Atypical pneumonia [J18.9] 01/18/2015  . Renal mass [N28.89] 12/20/2014  . Lung nodule [R91.1] 12/07/2014  . Muscle spasms of neck [M62.48] 10/17/2014  . Bipolar I disorder, most recent episode depressed ([facility]) [F31.30]   . Alcohol dependence with alcohol-induced mood disorder (HCC) [F10.24]   . MDD (major depressive disorder), severe (H[facility]) [F32.2] 09/19/2014  . Hyponatremia [E87.1] 08/15/2014  . Abscess [L02.91] 08/15/2014  . Loss of weight [R63.4] 05/26/2014  . Leukocytosis, unspecified [D72.829] 05/26/2014  . Rash and nonspecific skin eruption [R21] 04/21/2014  . Unspecified episodic mood disorder [F39] 03/21/2014  . Alcohol dependence (HCC) [F10.20] 03/21/2014  . Alcohol abuse [F10.10] 03/20/2014  . Cocaine abuse [F14.10] 03/20/2014  . Neck pain [M54.2] 01/31/2014  . Rotator cuff dysfunction [M75.100] 11/13/2013  . Closed fracture of unspecified phalanx or phalanges of hand [S62.609A] 10/04/2013  . Generalized anxiety disorder [F41.1] 08/30/2013  . Anxiety disorder [F41.9] 08/29/2013  . Admitted with dehydration [Z78.9] 08/29/2013  . Gastroenteritis [K52.9] 08/29/2013  . Malnutrition of mild degree (HCC) [E44.1] 08/29/2013  . Major depressive disorder, recurrent episode, severe (HCC) [F33.2] 07/14/2013  . HYPOGONADISM [E29.1] 08/28/2009  . ERECTILE DYSFUNCTION, ORGANIC  [N52.9] 08/28/2009  . Diabetes type 2, uncontrolled (HCC) [E11.65] 09/30/2006  . HYPERLIPIDEMIA [E78.5] 09/30/2006  . TOBACCO ABUSE [F17.200] 09/30/2006  . Essential hypertension [I10] 09/30/2006  . ALLERGIC RHINITIS [J30.9] 09/30/2006  . COPD (chronic obstructive pulmonary disease) (HCC) [J44.9] 09/30/2006  . GERD [K21.9] 09/30/2006  . 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 Backache [M54.9] 09/30/2006  . FIBROMYALGIA [IMO0001] 09/30/2006  . FATTY LIVER DISEASE, HX OF [Z87.19] 09/30/2006     Continued Clinical Symptoms:  Alcohol Use Disorder Identification Test Final Score (AUDIT): 0 The "Alcohol Use Disorders Identification Test", Guidelines for Use in Primary Care, Second Edition.  World Science writer [facility]). Score between 0-7:  no or low risk or alcohol related problems. Score between 8-15:  moderate risk of alcohol related problems. Score between 16-19:  high risk of alcohol related problems. Score 20 or above:  warrants further diagnostic evaluation for alcohol dependence and treatment.   CLINICAL FACTORS:   Severe Anxiety and/or Agitation Bipolar Disorder:   Depressive phase Depression:   Anhedonia Hopelessness Insomnia Recent sense of peace/wellbeing Severe Unstable or Poor Therapeutic Relationship Previous Psychiatric Diagnoses and Treatments Medical Diagnoses and Treatments/Surgeries   Musculoskeletal: Strength & Muscle Tone: decreased Gait & Station: normal Patient leans: N/A  Psychiatric Specialty Exam: Physical Exam  ROS  No Fever-chills, No Headache, No changes with Vision or hearing, reports vertigo No problems swallowing food or Liquids, No Chest pain, Cough or Shortness of Breath, No Abdominal pain, No Nausea or Vommitting, Bowel movements are regular, No Blood in stool or Urine, No dysuria, No new skin rashes or bruises, No new joints pains-aches,  No new weakness, tingling, numbness in any extremity, No recent  weight gain or  loss, No polyuria, polydypsia or polyphagia,   A full 10 point Review of Systems was done, except as stated above, all other Review of Systems were negative.  Blood pressure 122/77, pulse 98, temperature 97.8 F (36.6 C), temperature source Oral, resp. rate 16, height 6\' 1"  (1.854 m), weight 91.627 kg (202 lb), SpO2 98 %.Body mass index is 26.66 kg/(m^2).  General Appearance: Guarded  Eye Contact::  Good  Speech:  Clear and Coherent and Slow  Volume:  Decreased  Mood:  Anxious and Depressed  Affect:  Constricted and Depressed  Thought Process:  Coherent and Goal Directed  Orientation:  Full (Time, Place, and Person)  Thought Content:  Rumination  Suicidal Thoughts:  Yes.  without intent/plan  Homicidal Thoughts:  No  Memory:  Immediate;   Good Recent;   Good  Judgement:  Intact  Insight:  Fair  Psychomotor Activity:  Decreased and Restlessness  Concentration:  Fair  Recall:  Good  Fund of Knowledge:Good  Language: Good  Akathisia:  Negative  Handed:  Right  AIMS (if indicated):     Assets:  Communication Skills Desire for Improvement Financial Resources/Insurance Housing Intimacy Leisure Time Physical Health Resilience Social Support Transportation  Sleep:  Number of Hours: 6.75  Cognition: WNL  ADL's:  Intact     COGNITIVE FEATURES THAT CONTRIBUTE TO RISK:  Closed-mindedness, Loss of executive function and Polarized thinking    SUICIDE RISK:   Mild:  Suicidal ideation of limited frequency, intensity, duration, and specificity.  There are no identifiable plans, no associated intent, mild dysphoria and related symptoms, good self-control (both objective and subjective assessment), few other risk factors, and identifiable protective factors, including available and accessible social support.  PLAN OF CARE: Admit for increased symptoms of bipolar disorder most recent episode depression and passive suicidal ideation. Patient needed crisis stabilization,  safety monitoring on medication management for bipolar depression.  Medical Decision Making:  Review of Psycho-Social Stressors (1), Review or order clinical lab tests (1), Established Problem, Worsening (2), Review of Last Therapy Session (1), Review or order medicine tests (1), Review of Medication Regimen & Side Effects (2) and Review of New Medication or Change in Dosage (2)  I certify that inpatient services furnished can reasonably be expected to improve the patient's condition.   Ritesh Opara,JANARDHAHA R. 11/17/2015, 5:38 PM

## 2015-11-17 NOTE — Progress Notes (Signed)
Patient ID: Timothy MiresJeffrey L Lamm Jr., male   DOB: Mar 02, 1976, 40 y.o.   MRN: [id number] D: Client c/o headache and anxietyand did not feel like talking due to pain. Client reports he came over from the OBS unit. A: Writer reviewed and administered medications for pain and anxiety (see MAR). Staff will monitor q7815min for safety. R: Client is safe on the unit, attended group.

## 2015-11-18 DIAGNOSIS — F3130 Bipolar disorder, current episode depressed, mild or moderate severity, unspecified: Secondary | ICD-10-CM

## 2015-11-18 DIAGNOSIS — R45851 Suicidal ideations: Secondary | ICD-10-CM

## 2015-11-18 LAB — LIPID PANEL
Cholesterol: 151 mg/dL (ref 0–200)
HDL: 47 mg/dL (ref >40)
LDL CALC: 85 mg/dL (ref 0–99)
Total CHOL/HDL Ratio: 3.2 RATIO
Triglycerides: 93 mg/dL (ref <150)
VLDL: 19 mg/dL (ref 0–40)

## 2015-11-18 LAB — GLUCOSE, CAPILLARY
GLUCOSE-CAPILLARY: 284 mg/dL — AB (ref 65–99)
GLUCOSE-CAPILLARY: 286 mg/dL — AB (ref 65–99)
Glucose-Capillary: 239 mg/dL — ABNORMAL HIGH (ref 65–99)
Glucose-Capillary: 332 mg/dL — ABNORMAL HIGH (ref 65–99)
Glucose-Capillary: 158 mg/dL — ABNORMAL HIGH (ref 65–99)

## 2015-11-18 MED ORDER — LIDOCAINE 5 % EX PTCH
1.0000 | MEDICATED_PATCH | CUTANEOUS | Status: DC
  Administered 2015-11-18 – 2015-11-20 (×3): 1 via TRANSDERMAL
  Filled 2015-11-18 (×5): qty 1

## 2015-11-18 MED ORDER — TRAZODONE HCL 100 MG PO TABS
100.0000 mg | ORAL_TABLET | Freq: Every day | ORAL | Status: DC
  Administered 2015-11-18: 100 mg via ORAL
  Filled 2015-11-18 (×5): qty 1
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
## 2015-11-18 NOTE — [facility] Group Notes (Signed)
[facility] Group Notes:  (Nursing/MHT/Case Management/Adjunct)  Date:  11/18/2015  Time:  1100  Type of Therapy:  Nurse Education  /  Life Skills : The focus of the group is teaching patients how to develop healthy supporty systems.  Participation Level:  Active  Participation Quality:  Appropriate  Affect:  Appropriate  Cognitive:  Alert  Insight:  Appropriate  Engagement in Group:  Engaged  Modes of Intervention:  Education  Summary of Progress/Problems:  [name], [name] Lynn 11/18/2015, 2:37 PM

## 2015-11-18 NOTE — Progress Notes (Signed)
D) Pt has attended the groups and interacts with his peers. Is pleasant. Rates his depression at a 6, hopelessness at a 7 and his anxiety at a 7. Affect was flat and Pt depressed most of the day. This evening Pt more upbeat and laughing and joking. At times verbalizes increased hopelessness then will come around and talk. A) Given support and provided with several 1:1's throughout the day. Encouragement given. Therapeutic humor used with Pt.  R) Pt denies SI and HI.

## 2015-11-18 NOTE — Progress Notes (Signed)
D: Pt denies SI/HI/AVH. Pt is depressed and appears angry, but pt does not forward much to Clinical research associatewriter. Pt appears very focused on his medications.   A: Pt was offered support and encouragement. Pt was given scheduled medications. Pt was encourage to attend groups. Q 15 minute checks were done for safety.   R: Pt is taking medication.Pt receptive to treatment and safety maintained on unit.

## 2015-11-18 NOTE — Progress Notes (Signed)
D:Patient in the dayroom sitting quietly on approach.  Patient states he had a better day today.  Patient states he met his goal today which was to talk to the doctor about sleep.  Patient does appear depressed.  Patient denies SI/HI and denies AVH.   A: Staff to monitor Q 15 mins for safety.  Encouragement and support offered.  Scheduled medications administered per orders. R: Patient remains safe on the unit.  Patient attended group tonight.  Patient visible on the unit and interacting with peers.  Patient taking administered medications.

## 2015-11-18 NOTE — Plan of Care (Signed)
Problem: Ineffective individual coping Goal: STG: Patient will remain free from self harm Outcome: Progressing Pt safe on the unit at this time     

## 2015-11-18 NOTE — Progress Notes (Deleted)
Psychoeducational Group Note  Date:  11/18/2015 Time:  2346  Group Topic/Focus:  Wrap-Up Group:   The focus of this group is to help patients review their daily goal of treatment and discuss progress on daily workbooks.  Participation Level: Did Not Attend  Participation Quality:  Not Applicable  Affect:  Not Applicable  Cognitive:  Not Applicable  Insight:  Not Applicable  Engagement in Group: Not Applicable  Additional Comments:  The patient did not attend group this evening.   Hazle CocaGOODMAN, Dalanie Kisner S 11/18/2015, 11:46 PM

## 2015-11-18 NOTE — Progress Notes (Signed)
[facility]  11/18/2015 6:19 AM Mirna MiresJeffrey L Barabas Jr.  MRN:  [id number]  Subjective:  "The Seroquel just made my legs hurt and I can't sleep.  I was better off on Trazodone."  Objective:  Patient otherwise states he feels better.  He would like to discuss with "Dr Jama Flavorsobos tomorrow about Cymbalta and is it time to stop. I've been on it for 9 years and not as good anymore."  Patient would like to discuss being prescribed Zoloft.  Principal Problem: Bipolar I disorder, most recent episode depressed (HCC) Diagnosis:   Patient Active Problem List   Diagnosis Date Noted  . Bipolar 1 disorder, depressed (HCC) [F31.9] 11/15/2015  . Bipolar disorder with depression (HCC) [F31.30] 07/31/2015  . Insomnia [G47.00]   . Sinusitis, acute maxillary [J01.00] 02/06/2015  . Headache syndrome [G44.89] 02/06/2015  . Atypical pneumonia [J18.9] 01/18/2015  . Renal mass [N28.89] 12/20/2014  . Lung nodule [R91.1] 12/07/2014  . Muscle spasms of neck [M62.48] 10/17/2014  . Bipolar I disorder, most recent episode depressed (HCC) [F31.30]   . Alcohol dependence with alcohol-induced mood disorder (HCC) [F10.24]   . MDD (major depressive disorder), severe (HCC) [F32.2] 09/19/2014  . Hyponatremia [E87.1] 08/15/2014  . Abscess [L02.91] 08/15/2014  . Loss of weight [R63.4] 05/26/2014  . Leukocytosis, unspecified [D72.829] 05/26/2014  . Rash and nonspecific skin eruption [R21] 04/21/2014  . Unspecified episodic mood disorder [F39] 03/21/2014  . Alcohol dependence (HCC) [F10.20] 03/21/2014  . Alcohol abuse [F10.10] 03/20/2014  . Cocaine abuse [F14.10] 03/20/2014  . Neck pain [M54.2] 01/31/2014  . Rotator cuff dysfunction [M75.100] 11/13/2013  . Closed fracture of unspecified phalanx or phalanges of hand [S62.609A] 10/04/2013  . Generalized anxiety disorder [F41.1] 08/30/2013  . Anxiety disorder [F41.9] 08/29/2013  . Admitted with dehydration [Z78.9] 08/29/2013  . Gastroenteritis [K52.9] 08/29/2013  .  Malnutrition of mild degree (HCC) [E44.1] 08/29/2013  . Major depressive disorder, recurrent episode, severe (HCC) [F33.2] 07/14/2013  . HYPOGONADISM [E29.1] 08/28/2009  . 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 Diabetes type 2, uncontrolled (HCC) [E11.65] 09/30/2006  . HYPERLIPIDEMIA [E78.5] 09/30/2006  . TOBACCO ABUSE [F17.200] 09/30/2006  . Essential hypertension [I10] 09/30/2006  . ALLERGIC RHINITIS [J30.9] 09/30/2006  . COPD (chronic obstructive pulmonary disease) (HCC) [J44.9] 09/30/2006  . GERD [K21.9] 09/30/2006  . 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 Backache [M54.9] 09/30/2006  . FIBROMYALGIA [IMO0001] 09/30/2006  . FATTY LIVER DISEASE, HX OF [Z87.19] 09/30/2006   Total Time spent with patient: 30 minutes  Past Psychiatric History: depression  Past Medical History:  Past Medical History  Diagnosis Date  . Allergy   . Depression   . GERD (gastroesophageal reflux disease)   . Hyperlipidemia   . Chronic pain disorder     Sees [facility]  . Urinary incontinence     detrusor instability  . Hypogonadism   . Diabetes mellitus     Type II  . Chronic neck pain   . Bipolar depression (HCC)   . Opiate dependence, continuous (HCC)   . Polysubstance abuse   . Drug-seeking behavior     Past Surgical History  Procedure Laterality Date  . Appendectomy    . Hernia repair    . Nasal sinus surgery  2008   Family History:  Family History  Problem Relation Age of Onset  . Diabetes Mother   . Hypertension Mother   . Cirrhosis Mother   . Depression Mother   . Bipolar disorder Mother   .  Arthritis Father 49    osteoarthritis  . Bipolar disorder Father   . Diabetes Sister     borderline  . Heart disease Paternal Uncle 61    MI  . Heart disease Maternal Grandfather     late 60's--MI  . Cancer Paternal Grandmother 86    lung  . Bipolar disorder Paternal Grandmother   . Heart disease Paternal Grandfather 9    MI  .  Bipolar disorder Paternal Grandfather    Family Psychiatric  History: see above Social History:  History  Alcohol Use No     History  Drug Use No    Social History   Social History  . Marital Status: Legally Separated    Spouse Name: N/A  . Number of Children: 2  . Years of Education: N/A   Occupational History  . Disabled     chronic pain   Social History Main Topics  . Smoking status: Current Every Day Smoker -- 2.50 packs/day    Types: Cigarettes  . Smokeless tobacco: Never Used  . Alcohol Use: No  . Drug Use: No  . Sexual Activity: Not Asked   Other Topics Concern  . None   Social History Narrative   Additional Social History:    Pain Medications: SEE MAR Prescriptions: SEE MAR Over the Counter: SEE MAR History of alcohol / drug use?: Yes Longest period of sobriety (when/how long): 10-11 yrs Negative Consequences of Use: Personal relationships Name of Substance 1: Cocaine  1 - Age of First Use: 40 yrs old  1 - Amount (size/oz): n/a 1 - Frequency: n/a 1 - Duration: n/a 1 - Last Use / Amount: 9 months ago    Sleep: Fair  Appetite:  Fair  Current Medications: Current Facility-Administered Medications  Medication Dose Route Frequency Provider Last Rate Last Dose  . acetaminophen (TYLENOL) tablet 650 mg  650 mg Oral Q6H PRN Earney Navy, NP   650 mg at 11/17/15 1209  . alum & mag hydroxide-simeth (MAALOX/MYLANTA) 200-200-20 MG/5ML suspension 30 mL  30 mL Oral Q4H PRN Earney Navy, NP      . cyclobenzaprine (FLEXERIL) tablet 10 mg  10 mg Oral BID Adonis Brook, NP      . DULoxetine (CYMBALTA) DR capsule 60 mg  60 mg Oral Daily Worthy Flank, NP   60 mg at 11/17/15 0846  . fluticasone (FLONASE) 50 MCG/ACT nasal spray 1 spray  1 spray Each Nare Daily Worthy Flank, NP   1 spray at 11/16/15 0844  . Fluticasone Furoate-Vilanterol 100-25 MCG/INH AEPB 1 puff  1 puff Inhalation Daily Worthy Flank, NP   1 puff at 11/16/15 0850  . gabapentin  (NEURONTIN) tablet 800 mg  800 mg Oral TID Worthy Flank, NP   800 mg at 11/17/15 1646  . hydrOXYzine (ATARAX/VISTARIL) tablet 50 mg  50 mg Oral TID PRN Worthy Flank, NP   50 mg at 11/18/15 0103  . ibuprofen (ADVIL,MOTRIN) tablet 600 mg  600 mg Oral Q6H PRN Adonis Brook, NP   600 mg at 11/17/15 2120  . insulin aspart (novoLOG) injection 0-15 Units  0-15 Units Subcutaneous TID WC Worthy Flank, NP   8 Units at 11/17/15 1726  . insulin aspart (novoLOG) injection 2-8 Units  2-8 Units Subcutaneous QHS Adalberto Cole, RPH   7 Units at 11/17/15 2118  . ipratropium-albuterol (DUONEB) 0.5-2.5 (3) MG/3ML nebulizer solution 3 mL  3 mL Nebulization Q4H PRN Worthy Flank, NP      .  lamoTRIgine (LAMICTAL) tablet 300 mg  300 mg Oral Daily Worthy Flank, NP   300 mg at 11/17/15 0847  . loratadine (CLARITIN) tablet 10 mg  10 mg Oral Daily Worthy Flank, NP   10 mg at 11/17/15 0847  . lurasidone (LATUDA) tablet 40 mg  40 mg Oral BID Adonis Brook, NP   40 mg at 11/17/15 1723  . magnesium hydroxide (MILK OF MAGNESIA) suspension 30 mL  30 mL Oral Daily PRN Earney Navy, NP      . mirabegron ER (MYRBETRIQ) tablet 50 mg  50 mg Oral Daily Worthy Flank, NP   50 mg at 11/17/15 0847  . nicotine (NICODERM CQ - dosed in mg/24 hours) patch 21 mg  21 mg Transdermal Daily Nelly Rout, MD   21 mg at 11/17/15 0846  . pioglitazone (ACTOS) tablet 30 mg  30 mg Oral Daily Worthy Flank, NP   30 mg at 11/17/15 0846  . QUEtiapine (SEROQUEL) tablet 150 mg  150 mg Oral QHS Adonis Brook, NP   150 mg at 11/17/15 2120  . ramipril (ALTACE) capsule 2.5 mg  2.5 mg Oral Daily Worthy Flank, NP   2.5 mg at 11/17/15 0846  . simvastatin (ZOCOR) tablet 10 mg  10 mg Oral QHS Craige Cotta, MD   10 mg at 11/17/15 2120    Lab Results:  Results for orders placed or performed during the hospital encounter of 11/15/15 (from the past 48 hour(s))  Glucose, capillary     Status: Abnormal   Collection Time: 11/16/15  11:36 AM  Result Value Ref Range   Glucose-Capillary 259 (H) 65 - 99 mg/dL  Glucose, capillary     Status: Abnormal   Collection Time: 11/16/15  4:38 PM  Result Value Ref Range   Glucose-Capillary 281 (H) 65 - 99 mg/dL  Glucose, capillary     Status: Abnormal   Collection Time: 11/16/15  8:53 PM  Result Value Ref Range   Glucose-Capillary 185 (H) 65 - 99 mg/dL  Glucose, capillary     Status: Abnormal   Collection Time: 11/17/15  6:26 AM  Result Value Ref Range   Glucose-Capillary 189 (H) 65 - 99 mg/dL  Glucose, capillary     Status: Abnormal   Collection Time: 11/17/15 11:56 AM  Result Value Ref Range   Glucose-Capillary 325 (H) 65 - 99 mg/dL   Comment 1 Notify RN    Comment 2 Document in Chart   Glucose, capillary     Status: Abnormal   Collection Time: 11/17/15  5:18 PM  Result Value Ref Range   Glucose-Capillary 251 (H) 65 - 99 mg/dL   Comment 1 Notify RN    Comment 2 Document in Chart   Glucose, capillary     Status: Abnormal   Collection Time: 11/17/15  9:00 PM  Result Value Ref Range   Glucose-Capillary 309 (H) 65 - 99 mg/dL   Comment 1 Notify RN    Comment 2 Document in Chart     Physical Findings: AIMS: Facial and Oral Movements Muscles of Facial Expression: None, normal Lips and Perioral Area: None, normal Jaw: None, normal Tongue: None, normal,Extremity Movements Upper (arms, wrists, hands, fingers): None, normal Lower (legs, knees, ankles, toes): None, normal, Trunk Movements Neck, shoulders, hips: None, normal, Overall Severity Severity of abnormal movements (highest score from questions above): None, normal Incapacitation due to abnormal movements: None, normal Patient's awareness of abnormal movements (rate only patient's report): No Awareness, Dental Status  Current problems with teeth and/or dentures?: No Does patient usually wear dentures?: No  CIWA:  CIWA-Ar Total: 0 COWS:  COWS Total Score: 0  Musculoskeletal: Strength & Muscle Tone: within  normal limits Gait & Station: normal Patient leans: N/A  Psychiatric Specialty Exam: Review of Systems  Psychiatric/Behavioral: Positive for depression. The patient is nervous/anxious.     Blood pressure 122/77, pulse 98, temperature 97.8 F (36.6 C), temperature source Oral, resp. rate 16, height 6\' 1"  (1.854 m), weight 91.627 kg (202 lb), SpO2 98 %.Body mass index is 26.66 kg/(m^2).   General Appearance: Neat  Eye Contact:: Good  Speech: Normal Rate  Volume: Normal  Mood: Anxious and Euthymic  Affect: Appropriate  Thought Process: Coherent  Orientation: Full (Time, Place, and Person)  Thought Content: Rumination  Suicidal Thoughts: Yes without intent  Homicidal Thoughts: No  Memory: Immediate; Fair Recent; Fair Remote; Fair  Judgement: Fair  Insight: Fair  Psychomotor Activity: Normal  Concentration: Good  Recall: Good  Fund of Knowledge:Good  Language: Good  Akathisia: Negative  Handed: Right  AIMS (if indicated):   Assets: Desire for Improvement Resilience Social Support  ADL's: Intact  Cognition: WNL  Sleep: Number of Hours: 7           Treatment Plan Summary: Latuda 40 mg BID mood stabilization Cymbalta 60 mg daily depression Lamictal 300 mg daily mood stabilization D/C Seroquel 150 mg daily sleep and anxiety Flexeril 10 mg BID back pain Start Trazodone 100 mg QHS insomnia  Velna Hatchet May Agustin AGNP-BC 11/18/2015, 6:19 AM  Reviewed the information documented and agree with the treatment plan.  Yuepheng Schaller,JANARDHAHA R. 11/18/2015 2:57 PM

## 2015-11-18 NOTE — [facility] Group Notes (Signed)
[facility] Group Notes:  (Clinical Social Work)   11/18/2015 1:15-2:15PM  Summary of Progress/Problems:   The main focus of today's process group was to   1)  discuss the importance of adding supports  2)  define health supports versus unhealthy supports  3)  identify the patient's current unhealthy supports and plan how to handle them  4)  Identify the patient's current healthy supports and plan what to add.  An emphasis was placed on using counselor, doctor, therapy groups, 12-step groups, and problem-specific support groups to expand supports.    The patient expressed full comprehension of the concepts presented, and agreed that there is a need to add more supports.  The patient stated his children and his mother are his healthy supports while his ex-girlfriend is an unhealthy support because even though she herself is newly-diagnosed with Bipolar Disorder, she does not realize his recent actions were taken during a "bipolar episode."  He also talked about having symptoms of Intermittent Explosive Disorder when another patient described that.  He seemed to accept the idea that he needs to put in place some new supports to prevent a repeat of this crisis, and is seeking a counselor after-hours of his work schedule.  Type of Therapy:  Process Group with Motivational Interviewing  Participation Level:  Active  Participation Quality:  Attentive, Sharing and Supportive  Affect:  Blunted  Cognitive:  Appropriate and Oriented  Insight:  Engaged  Engagement in Therapy:  Engaged  Modes of Intervention:   Education, Support and Processing, Activity  Timothy MantleMareida Grossman-Orr, LCSW 11/18/2015   4:32 PM

## 2015-11-19 ENCOUNTER — Encounter: Payer: Self-pay | Admitting: *Deleted

## 2015-11-19 LAB — GLUCOSE, CAPILLARY
GLUCOSE-CAPILLARY: 425 mg/dL — AB (ref 65–99)
GLUCOSE-CAPILLARY: 176 mg/dL — AB (ref 65–99)
Glucose-Capillary: 290 mg/dL — ABNORMAL HIGH (ref 65–99)
Glucose-Capillary: 395 mg/dL — ABNORMAL HIGH (ref 65–99)

## 2015-11-19 LAB — HEMOGLOBIN A1C
HEMOGLOBIN A1C: 8.3 % — AB (ref 4.8–5.6)
MEAN PLASMA GLUCOSE: 192 mg/dL

## 2015-11-19 MED ORDER — INSULIN ASPART 100 UNIT/ML ~~LOC~~ SOLN: 0.0000 [IU] | Freq: Three times a day (TID) | SUBCUTANEOUS | Status: DC

## 2015-11-19 MED ORDER — DULOXETINE HCL 20 MG PO CPEP
20.0000 mg | ORAL_CAPSULE | Freq: Every day | ORAL | Status: DC
  Filled 2015-11-19 (×2): qty 1

## 2015-11-19 MED ORDER — DULOXETINE HCL 30 MG PO CPEP
30.0000 mg | ORAL_CAPSULE | Freq: Every day | ORAL | Status: DC
Start: 2015-11-20 — End: 2015-11-21
  Administered 2015-11-20 – 2015-11-21 (×2): 30 mg via ORAL
  Filled 2015-11-19 (×3): qty 1

## 2015-11-19 MED ORDER — INSULIN ASPART 100 UNIT/ML ~~LOC~~ SOLN
4.0000 [IU] | Freq: Three times a day (TID) | SUBCUTANEOUS | Status: DC
  Administered 2015-11-19 – 2015-11-20 (×2): 4 [IU] via SUBCUTANEOUS

## 2015-11-19 MED ORDER — TRAZODONE HCL 100 MG PO TABS
100.0000 mg | ORAL_TABLET | Freq: Every evening | ORAL | Status: DC | PRN
  Administered 2015-11-19 – 2015-11-20 (×2): 100 mg via ORAL
  Filled 2015-11-19 (×2): qty 1

## 2015-11-19 MED ORDER — SERTRALINE HCL 50 MG PO TABS
50.0000 mg | ORAL_TABLET | Freq: Every day | ORAL | Status: DC
  Administered 2015-11-20 – 2015-11-21 (×2): 50 mg via ORAL
  Filled 2015-11-19 (×3): qty 1

## 2015-11-19 NOTE — Tx Team (Signed)
Interdisciplinary Treatment Plan Update (Adult) Date: 11/19/2015   Date: 11/19/2015 10:27 AM  Progress in Treatment:  Attending groups: Yes  Participating in groups: Yes  Taking medication as prescribed: Yes  Tolerating medication: Yes  Family/Significant othe contact made: No, CSW attempting to make contact with mother Patient understands diagnosis: Yes AEB seeking help with depression Discussing patient identified problems/goals with staff: Yes  Medical problems stabilized or resolved: Yes  Denies suicidal/homicidal ideation: Yes Patient has not harmed self or Others: Yes   New problem(s) identified: None identified at this time.   Discharge Plan or Barriers: Pt will return home and follow-up with [facility]; is open to referral at [place] for therapy.  Additional comments:  Patient and CSW reviewed pt's identified goals and treatment plan. Patient verbalized understanding and agreed to treatment plan. CSW reviewed [facility] "Discharge Process and Patient Involvement" Form. Pt verbalized understanding of information provided and signed form.   Reason for Continuation of Hospitalization:  Anxiety Depression Medication stabilization Suicidal ideation  Estimated length of stay: 3-5 days  Review of initial/current patient goals per problem list:   1.  Goal(s): Patient will participate in aftercare plan  Met:  Yes  Target date: 3-5 days from date of admission   As evidenced by: Patient will participate within aftercare plan AEB aftercare provider and housing plan at discharge being identified.   11/19/15: Pt will return home and follow-up with [facility]; is open to referral at [place] for therapy.  2.  Goal (s): Patient will exhibit decreased depressive symptoms and suicidal ideations.  Met:  No  Target date: 3-5 days from date of admission   As evidenced by: Patient will utilize self rating of depression at 3 or below and demonstrate decreased signs of  depression or be deemed stable for discharge by MD.  11/19/15: Pt rates depression at 7/10; denies SI  3.  Goal(s): Patient will demonstrate decreased signs and symptoms of anxiety.  Met:  No  Target date: 3-5 days from date of admission   As evidenced by: Patient will utilize self rating of anxiety at 3 or below and demonstrated decreased signs of anxiety, or be deemed stable for discharge by MD  11/19/15: Pt rates anxiety at 5/10; observed to have anxious affect Attendees:  Patient:    Family:    Physician: Dr. Parke Poisson, MD  11/19/2015 10:27 AM  Nursing: Lars Pinks, RN Case manager  11/19/2015 10:27 AM  Clinical Social Worker Peri Maris, [place] 11/19/2015 10:27 AM  Other: Maxie Better, [place] 11/19/2015 10:27 AM  Clinical: Darrol Angel RN 11/19/2015 10:27 AM  Other: , RN Charge Nurse 11/19/2015 10:27 AM  Other: Hilda Lias, [place], [place] Work [phone]

## 2015-11-19 NOTE — Progress Notes (Signed)
Per Dr. Lolly MustacheArfeen, Pt cannot be seen by him any longer due to conflict of interest. Pt can [facility] another [facility], but not Dr. Lolly MustacheArfeen. CSW notified Chad CordialLauren Carter, LCSW.  Trula SladeHeather Smart, MSW, LCSW Clinical Social Worker 11/19/2015 2:06 PM

## 2015-11-19 NOTE — [facility] Group Notes (Signed)
[facility] LCSW Group Therapy  11/19/2015 1:15pm  Type of Therapy:  Group Therapy vercoming Obstacles  Participation Level:  Active  Participation Quality:  Appropriate   Affect:  Appropriate  Cognitive:  Appropriate and Oriented  Insight:  Developing/Improving and Improving  Engagement in Therapy:  Improving  Modes of Intervention:  Discussion, Exploration, Problem-solving and Support  Description of Group:   In this group patients will be encouraged to explore what they see as obstacles to their own wellness and recovery. They will be guided to discuss their thoughts, feelings, and behaviors related to these obstacles. The group will process together ways to cope with barriers, with attention given to specific choices patients can make. Each patient will be challenged to identify changes they are motivated to make in order to overcome their obstacles. This group will be process-oriented, with patients participating in exploration of their own experiences as well as giving and receiving support and challenge from other group members.  Summary of Patient Progress: Pt insight developing AEB his ability to recognize his progress in recovery as he compared his state at previous admission. Pt reports that he has been more aware of his mental health symptoms and describes that mental stability increases his competency to deal with obstacles.   Therapeutic Modalities:   Cognitive Behavioral Therapy Solution Focused Therapy Motivational Interviewing Relapse Prevention Therapy   Chad CordialLauren Carter, LCSWA 11/19/2015 3:04 PM

## 2015-11-19 NOTE — Progress Notes (Signed)
Recreation Therapy Notes  Date: 01.09.2017 Time: 9:30am Location: [address] Dayroom   Group Topic: Stress Management  Goal Area(s) Addresses:  Patient will actively participate in stress management techniques presented during session.   Behavioral Response: Appropriate, Engaged   Intervention: Stress management techniques  Activity :  Deep Breathing and Progressive Muscle Relaxation. LRT provided instruction and demonstration on practice of Progressive Muscle Relaxation. Technique was coupled with deep breathing.   Education:  Stress Management, Discharge Planning.   Education Outcome: Acknowledges education  Clinical Observations/Feedback: Patient actively engaged in technique introduced, expressed no concerns and demonstrated ability to practice independently post d/c.   Marykay Lexenise L Luba Matzen, LRT/CTRS        Lorianne Malbrough L 11/19/2015 2:55 PM

## 2015-11-19 NOTE — Progress Notes (Signed)
Pt CBG 425. Vernona RiegerLaura, NP and Dr. Jama Flavorsobos made aware. Vernona RiegerLaura added meal coverage 4u. Pt is to be given 15u along with 4u  for coverage. Will recheck CBG in 1hour

## 2015-11-19 NOTE — Plan of Care (Signed)
Problem: Alteration in mood Goal: LTG-Patient reports reduction in suicidal thoughts (Patient reports reduction in suicidal thoughts and is able to verbalize a safety plan for whenever patient is feeling suicidal)  Outcome: Progressing Pt denies suicidal thoughts. Pt verbally contract for safety.     

## 2015-11-19 NOTE — [facility] Group Notes (Signed)
[facility]  11/19/2015 8:45 AM  Participation Quality: Alert, Appropriate and Oriented  Mood/Affect: Flat  Depression Rating: 7  Anxiety Rating: 5  Thoughts of Suicide: Pt denies SI/HI  Will you contract for safety? Yes  Current AVH: Pt denies  Plan for Discharge/Comments: Pt attended discharge planning group and actively participated in group. CSW discussed suicide prevention education with the group and encouraged them to discuss discharge planning and any relevant barriers. Pt reports that he is feeling "down" today but was able to sleep better last night. Pt maintains minimal eye contact.  Transportation Means: Pt reports access to transportation  Supports: No supports mentioned at this time  Chad CordialLauren Carter, LCSWA 11/19/2015 10:25 AM

## 2015-11-19 NOTE — Progress Notes (Signed)
[facility] Group Notes:  (Nursing/MHT/Case Management/Adjunct)  Date:  11/19/2015  Time:  12:38 AM  Type of Therapy:  Psychoeducational Skills  Participation Level:  Active  Participation Quality:  Attentive  Affect:  Appropriate  Cognitive:  Appropriate  Insight:  Improving  Engagement in Group:  Developing/Improving  Modes of Intervention:  Education  Summary of Progress/Problems: Patient shared with the group last evening that he had a "better" day since he was beginning to feel better as a whole. As for the theme of the day, his support system will consist of his children and mother.   Hazle CocaGOODMAN, Sanjna Haskew S 11/19/2015, 12:38 AM

## 2015-11-19 NOTE — Progress Notes (Signed)
Patient ID: [name]., male   DOB: Oct 13, 1976, 40 y.o.   MRN: [id number] [facility] MD Progress Note  11/19/2015 12:31 PM [name].  MRN:  [id number]  Subjective:  Patient reports partial improvement compared to his admission. He states he still feels anxious, depressed, but " getting better". Of note, he is wanting to stop Cymbalta- states " I have been on this medication for like ten years, and I think it is no longer working. States that he is interested in trying Zoloft . States " I have not been on Zoloft before , but I have heard it works well for people". Denies medication side effects- does feel Anette Guarneri has been an effective medication for him. States  It has helped stabilize his mood , and decrease his impulsive outbursts . Patient states he has a history of substance abuse, but states he has been sober for several months, denies any recent drug abuse . Major stressor at this time is  Recent argument / break up with SO.  Objective:  I have discussed case with treatment team and have met with patient. Patient partially improved compared to admission , but reports ongoing depression, some anxiety. States he does not feel Cymbalta is helping any longer and wants to taper off .  Does feel Anette Guarneri is working, and denies side effects from this medication- of note, no akathisia or abnormal involuntary movements noted/reported. Patient is visible on unit, going to groups, behavior in good control. Has history of DM, at home normally uses insulin pump- at this time on Actos and Insulin Sliding Scale .   Principal Problem: Bipolar I disorder, most recent episode depressed ([place]) Diagnosis:   Patient Active Problem List   Diagnosis Date Noted  . Bipolar 1 disorder, depressed ([place]) [F31.9] 11/15/2015  . Bipolar disorder with depression ([place]) [F31.30] 07/31/2015  . Insomnia [G47.00]   . Sinusitis, acute maxillary [J01.00] 02/06/2015  . Headache syndrome [G44.89] 02/06/2015  . Atypical  pneumonia [J18.9] 01/18/2015  . Renal mass [N28.89] 12/20/2014  . Lung nodule [R91.1] 12/07/2014  . Muscle spasms of neck [M62.48] 10/17/2014  . Bipolar I disorder, most recent episode depressed ([place]) [F31.30]   . Alcohol dependence with alcohol-induced mood disorder ([place]) [F10.24]   . MDD (major depressive disorder), severe ([place]) [F32.2] 09/19/2014  . Hyponatremia [E87.1] 08/15/2014  . Abscess [L02.91] 08/15/2014  . Loss of weight [R63.4] 05/26/2014  . Leukocytosis, unspecified [D72.829] 05/26/2014  . Rash and nonspecific skin eruption [R21] 04/21/2014  . Unspecified episodic mood disorder [F39] 03/21/2014  . Alcohol dependence ([place]) [F10.20] 03/21/2014  . Alcohol abuse [F10.10] 03/20/2014  . Cocaine abuse [F14.10] 03/20/2014  . Neck pain [M54.2] 01/31/2014  . Rotator cuff dysfunction [M75.100] 11/13/2013  . Closed fracture of unspecified phalanx or phalanges of hand [S62.609A] 10/04/2013  . Generalized anxiety disorder [F41.1] 08/30/2013  . Anxiety disorder [F41.9] 08/29/2013  . Admitted with dehydration [Z78.9] 08/29/2013  . Gastroenteritis [K52.9] 08/29/2013  . Malnutrition of mild degree ([place]) [E44.1] 08/29/2013  . Major depressive disorder, recurrent episode, severe ([place]) [F33.2] 07/14/2013  . HYPOGONADISM [E29.1] 08/28/2009  . 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 Diabetes type 2, uncontrolled ([place]) [E11.65] 09/30/2006  . HYPERLIPIDEMIA [E78.5] 09/30/2006  . TOBACCO ABUSE [F17.200] 09/30/2006  . Essential hypertension [I10] 09/30/2006  . ALLERGIC RHINITIS [J30.9] 09/30/2006  . COPD (chronic obstructive pulmonary disease) ([place]) [J44.9] 09/30/2006  . GERD [K21.9] 09/30/2006  . 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 Backache [M54.9] 09/30/2006  .  FIBROMYALGIA [IMO0001] 09/30/2006  . FATTY LIVER DISEASE, HX OF [Z87.19] 09/30/2006   Total Time spent with patient:  25 minutes   Past Psychiatric History: depression  Past Medical  History:  Past Medical History  Diagnosis Date  . Allergy   . Depression   . GERD (gastroesophageal reflux disease)   . Hyperlipidemia   . Chronic pain disorder     Sees [facility]  . Urinary incontinence     detrusor instability  . Hypogonadism   . Diabetes mellitus     Type II  . Chronic neck pain   . Bipolar depression ([place])   . Opiate dependence, continuous ([place])   . Polysubstance abuse   . Drug-seeking behavior     Past Surgical History  Procedure Laterality Date  . Appendectomy    . Hernia repair    . Nasal sinus surgery  2008   Family History:  Family History  Problem Relation Age of Onset  . Diabetes Mother   . Hypertension Mother   . Cirrhosis Mother   . Depression Mother   . Bipolar disorder Mother   . Arthritis Father 48    osteoarthritis  . Bipolar disorder Father   . Diabetes Sister     borderline  . Heart disease Paternal Uncle 66    MI  . Heart disease Maternal Grandfather     late 60's--MI  . Cancer Paternal Grandmother 92    lung  . Bipolar disorder Paternal Grandmother   . Heart disease Paternal Grandfather 2    MI  . Bipolar disorder Paternal Grandfather    Family Psychiatric  History: see above Social History:  History  Alcohol Use No     History  Drug Use No    Social History   Social History  . Marital Status: Legally Separated    Spouse Name: N/A  . Number of Children: 2  . Years of Education: N/A   Occupational History  . Disabled     chronic pain   Social History Main Topics  . Smoking status: Current Every Day Smoker -- 2.50 packs/day    Types: Cigarettes  . Smokeless tobacco: Never Used  . Alcohol Use: No  . Drug Use: No  . Sexual Activity: Not Asked   Other Topics Concern  . None   Social History Narrative   Additional Social History:    Pain Medications: SEE MAR Prescriptions: SEE MAR Over the Counter: SEE MAR History of alcohol / drug use?: Yes Longest period of sobriety (when/how  long): 10-11 yrs Negative Consequences of Use: Personal relationships Name of Substance 1: Cocaine  1 - Age of First Use: 40 yrs old  1 - Amount (size/oz): n/a 1 - Frequency: n/a 1 - Duration: n/a 1 - Last Use / Amount: 9 months ago    Sleep: improving   Appetite:  Fair  Current Medications: Current Facility-Administered Medications  Medication Dose Route Frequency Provider Last Rate Last Dose  . acetaminophen (TYLENOL) tablet 650 mg  650 mg Oral Q6H PRN Delfin Gant, NP   650 mg at 11/17/15 1209  . alum & mag hydroxide-simeth (MAALOX/MYLANTA) 200-200-20 MG/5ML suspension 30 mL  30 mL Oral Q4H PRN Delfin Gant, NP      . cyclobenzaprine (FLEXERIL) tablet 10 mg  10 mg Oral BID Kerrie Buffalo, NP   10 mg at 11/19/15 0817  . [START ON 11/20/2015] DULoxetine (CYMBALTA) DR capsule 20 mg  20 mg Oral Daily Jenne Campus, MD      .  fluticasone (FLONASE) 50 MCG/ACT nasal spray 1 spray  1 spray Each Nare Daily Harriet Butte, NP   1 spray at 11/19/15 0817  . Fluticasone Furoate-Vilanterol 100-25 MCG/INH AEPB 1 puff  1 puff Inhalation Daily Harriet Butte, NP   1 puff at 11/16/15 0850  . gabapentin (NEURONTIN) tablet 800 mg  800 mg Oral TID Harriet Butte, NP   800 mg at 11/19/15 1202  . hydrOXYzine (ATARAX/VISTARIL) tablet 50 mg  50 mg Oral TID PRN Harriet Butte, NP   50 mg at 11/18/15 0103  . ibuprofen (ADVIL,MOTRIN) tablet 600 mg  600 mg Oral Q6H PRN Kerrie Buffalo, NP   600 mg at 11/19/15 1005  . insulin aspart (novoLOG) injection 0-15 Units  0-15 Units Subcutaneous TID WC Harriet Butte, NP   15 Units at 11/19/15 1201  . insulin aspart (novoLOG) injection 2-8 Units  2-8 Units Subcutaneous QHS Royetta Asal, RPH   6 Units at 11/18/15 2113  . ipratropium-albuterol (DUONEB) 0.5-2.5 (3) MG/3ML nebulizer solution 3 mL  3 mL Nebulization Q4H PRN Harriet Butte, NP      . lamoTRIgine (LAMICTAL) tablet 300 mg  300 mg Oral Daily Harriet Butte, NP   300 mg at 11/19/15 0817  .  lidocaine (LIDODERM) 5 % 1 patch  1 patch Transdermal Q24H Kerrie Buffalo, NP   1 patch at 11/18/15 1704  . loratadine (CLARITIN) tablet 10 mg  10 mg Oral Daily Harriet Butte, NP   10 mg at 11/19/15 0818  . lurasidone (LATUDA) tablet 40 mg  40 mg Oral BID Kerrie Buffalo, NP   40 mg at 11/19/15 0817  . magnesium hydroxide (MILK OF MAGNESIA) suspension 30 mL  30 mL Oral Daily PRN Delfin Gant, NP      . mirabegron ER (MYRBETRIQ) tablet 50 mg  50 mg Oral Daily Harriet Butte, NP   50 mg at 11/19/15 0817  . nicotine (NICODERM CQ - dosed in mg/24 hours) patch 21 mg  21 mg Transdermal Daily Hampton Abbot, MD   21 mg at 11/19/15 0818  . pioglitazone (ACTOS) tablet 30 mg  30 mg Oral Daily Harriet Butte, NP   30 mg at 11/19/15 0817  . ramipril (ALTACE) capsule 2.5 mg  2.5 mg Oral Daily Harriet Butte, NP   2.5 mg at 11/19/15 0817  . [START ON 11/20/2015] sertraline (ZOLOFT) tablet 50 mg  50 mg Oral Daily Myer Peer Sinclair Alligood, MD      . simvastatin (ZOCOR) tablet 10 mg  10 mg Oral QHS Jenne Campus, MD   10 mg at 11/18/15 2111  . traZODone (DESYREL) tablet 100 mg  100 mg Oral QHS Kerrie Buffalo, NP   100 mg at 11/18/15 2111    Lab Results:  Results for orders placed or performed during the hospital encounter of 11/15/15 (from the past 48 hour(s))  Glucose, capillary     Status: Abnormal   Collection Time: 11/17/15  5:18 PM  Result Value Ref Range   Glucose-Capillary 251 (H) 65 - 99 mg/dL   Comment 1 Notify RN    Comment 2 Document in Chart   Glucose, capillary     Status: Abnormal   Collection Time: 11/17/15  9:00 PM  Result Value Ref Range   Glucose-Capillary 309 (H) 65 - 99 mg/dL   Comment 1 Notify RN    Comment 2 Document in Chart   Glucose, capillary     Status: Abnormal  Collection Time: 11/18/15  6:17 AM  Result Value Ref Range   Glucose-Capillary 284 (H) 65 - 99 mg/dL   Comment 1 Notify RN    Comment 2 Document in Chart   Hemoglobin A1c     Status: Abnormal   Collection  Time: 11/18/15  6:29 AM  Result Value Ref Range   Hgb A1c MFr Bld 8.3 (H) 4.8 - 5.6 %    Comment: (NOTE)         Pre-diabetes: 5.7 - 6.4         Diabetes: >6.4         Glycemic control for adults with diabetes: <7.0    Mean Plasma Glucose 192 mg/dL    Comment: (NOTE) Performed At: [facility] [place], [place] [id number] Lindon Romp MD [id number] Performed at [facility]   Lipid panel     Status: None   Collection Time: 11/18/15  6:29 AM  Result Value Ref Range   Cholesterol 151 0 - 200 mg/dL   Triglycerides 93 <150 mg/dL   HDL 47 >40 mg/dL   Total CHOL/HDL Ratio 3.2 RATIO   VLDL 19 0 - 40 mg/dL   LDL Cholesterol 85 0 - 99 mg/dL    Comment:        Total Cholesterol/HDL:CHD Risk Coronary Heart Disease Risk Table                     Men   Women  1/2 Average Risk   3.4   3.3  Average Risk       5.0   4.4  2 X Average Risk   9.6   7.1  3 X Average Risk  23.4   11.0        Use the calculated Patient Ratio above and the CHD Risk Table to determine the patient's CHD Risk.        ATP III CLASSIFICATION (LDL):  <100     mg/dL   Optimal  100-129  mg/dL   Near or Above                    Optimal  130-159  mg/dL   Borderline  160-189  mg/dL   High  >190     mg/dL   Very High Performed at [facility]   Glucose, capillary     Status: Abnormal   Collection Time: 11/18/15 11:54 AM  Result Value Ref Range   Glucose-Capillary 332 (H) 65 - 99 mg/dL   Comment 1 Notify RN    Comment 2 Document in Chart   Glucose, capillary     Status: Abnormal   Collection Time: 11/18/15  5:00 PM  Result Value Ref Range   Glucose-Capillary 158 (H) 65 - 99 mg/dL   Comment 1 Notify RN    Comment 2 Document in Chart   Glucose, capillary     Status: Abnormal   Collection Time: 11/18/15  8:36 PM  Result Value Ref Range   Glucose-Capillary 286 (H) 65 - 99 mg/dL  Glucose, capillary     Status: Abnormal   Collection Time: 11/19/15  5:47 AM   Result Value Ref Range   Glucose-Capillary 290 (H) 65 - 99 mg/dL   Comment 1 Notify RN     Physical Findings: AIMS: Facial and Oral Movements Muscles of Facial Expression: None, normal Lips and Perioral Area: None, normal Jaw: None, normal Tongue: None, normal,Extremity Movements Upper (arms, wrists, hands,  fingers): None, normal Lower (legs, knees, ankles, toes): None, normal, Trunk Movements Neck, shoulders, hips: None, normal, Overall Severity Severity of abnormal movements (highest score from questions above): None, normal Incapacitation due to abnormal movements: None, normal Patient's awareness of abnormal movements (rate only patient's report): No Awareness, Dental Status Current problems with teeth and/or dentures?: No Does patient usually wear dentures?: No  CIWA:  CIWA-Ar Total: 0 COWS:  COWS Total Score: 0  Musculoskeletal: Strength & Muscle Tone: within normal limits Gait & Station: normal Patient leans: N/A  Psychiatric Specialty Exam: Review of Systems  Psychiatric/Behavioral: Positive for depression. The patient is nervous/anxious.   denies headache, denies chest pain, no SOB  Blood pressure 115/62, pulse 102, temperature 97.7 F (36.5 C), temperature source Oral, resp. rate 18, height 6' 1"  (1.854 m), weight 202 lb (91.627 kg), SpO2 98 %.Body mass index is 26.66 kg/(m^2).   General Appearance: Neat  Eye Contact:: Good  Speech: Normal Rate  Volume: Normal  Mood: less depressed, still reports anxiety  Affect: Appropriate, reactive, anxious   Thought Process: Coherent  Orientation: Full (Time, Place, and Person)  Thought Content: Rumination about recent stressors, no hallucinations, no delusions   Suicidal Thoughts: denies any current plan or intention of hurting self or of SI  Homicidal Thoughts: denies any homicidal or violent ideations, also specifically denies any homicidal ideations towards ex GF   Memory: recent and remote  grossly intact   Judgement: Fair  Insight: Fair  Psychomotor Activity: Normal  Concentration: Good  Recall: Good  Fund of Knowledge:Good  Language: Good  Akathisia: Negative  Handed: Right  AIMS (if indicated):   Assets: Desire for Improvement Resilience Social Support  ADL's: Intact  Cognition: WNL  Sleep: Number of Hours: 7          Assessment - at this time patient partially improved compared to admission- less depressed, still anxious, ruminative about stressors, mainly recent relationship discord/break up. Tolerating medications well , but states he does not feel Cymbalta is helping any longer and focused on changing it to another medication, in particular interested in Zoloft . Does feel Anette Guarneri is working not only for mood disorder, but also to decrease periods of explosiveness, angry outbursts . We discussed tapering Cymbalta rather than D/Cing to minimize potential WDL symptoms   Treatment Plan Summary: Encourage ongoing group , milieu participation to work on coping skills and symptom reduction Latuda 40 mg BID for mood disorder  Taper Cymbalta to 30 mg QDAY initially Start Zoloft 50 mgrs QDAY  Lamictal 300 mg QDAY for mood disorder  Flexeril 10 mg BID back pain Continue Trazodone 100 mg QHS  PRN for insomnia Diabetic Coord. Consultation for optmization of Diabetic management   Neita Garnet MD  11/19/2015, 12:31 PM

## 2015-11-19 NOTE — Plan of Care (Signed)
Problem: Ineffective individual coping Goal: STG-Increase in ability to manage activities of daily living Outcome: Progressing Pt performed ADLs today. Hygiene appropriate for pt and situation.

## 2015-11-19 NOTE — Progress Notes (Signed)
D: Pt presents with flat affect and depressed mood. Pt rates decreasing depression 5/10. Hopeless 5/10. Anxiety 5/10. Pt reports "better sleep last night". Pt denies suicidal thoughts. Pt stated goal "discharge plans, medications to sleep better and to talk about medications with MD". Pt c/o chronic neck pain. Pt given Motrin at his request for pain. Pt compliant with taking meds and attending groups.  A:  Medications administered as ordered per MD. Verbal support provided. Pt encouraged to attend groups. 15 minute checks performed for safety. R: Pt receptive to tx.

## 2015-11-20 ENCOUNTER — Ambulatory Visit (HOSPITAL_COMMUNITY): Payer: Self-pay | Admitting: Psychiatry

## 2015-11-20 LAB — GLUCOSE, CAPILLARY
GLUCOSE-CAPILLARY: 259 mg/dL — AB (ref 65–99)
GLUCOSE-CAPILLARY: 315 mg/dL — AB (ref 65–99)
Glucose-Capillary: 287 mg/dL — ABNORMAL HIGH (ref 65–99)
Glucose-Capillary: 410 mg/dL — ABNORMAL HIGH (ref 65–99)

## 2015-11-20 MED ORDER — INSULIN ASPART 100 UNIT/ML ~~LOC~~ SOLN
6.0000 [IU] | Freq: Once | SUBCUTANEOUS | Status: AC
  Administered 2015-11-20: 6 [IU] via SUBCUTANEOUS

## 2015-11-20 MED ORDER — INSULIN GLARGINE 100 UNIT/ML ~~LOC~~ SOLN
20.0000 [IU] | Freq: Every day | SUBCUTANEOUS | Status: DC
  Administered 2015-11-20: 20 [IU] via SUBCUTANEOUS

## 2015-11-20 MED ORDER — INSULIN ASPART 100 UNIT/ML ~~LOC~~ SOLN
6.0000 [IU] | Freq: Three times a day (TID) | SUBCUTANEOUS | Status: DC
  Administered 2015-11-20 – 2015-11-21 (×4): 6 [IU] via SUBCUTANEOUS

## 2015-11-20 NOTE — [facility] Group Notes (Signed)

## 2015-11-20 NOTE — Progress Notes (Signed)
D. Pt had been in room much of the evening, minimal interaction or participation in milieu. Pt did appear depressed while in the milieu but reports that he is feeling much better then when he came in. Pt spoke about how he is suppose to be discharged tomorrow and will go back home and feels that he is ready for this. Pt received bedtime medications without incident and did not verbalize any complaints of pain. A. Support and encouragement provided. R. Safety maintained, will continue to monitor.

## 2015-11-20 NOTE — [facility] Group Notes (Signed)
[facility] LCSW Group Therapy  11/20/2015   1:15 PM   Type of Therapy:  Group Therapy  Participation Level:  Active  Participation Quality:  Attentive  Affect: Appropriate  Cognitive:  Alert and Oriented  Insight:  Developing/Improving and Engaged  Engagement in Therapy:  Developing/Improving and Engaged  Modes of Intervention:  Clarification, Confrontation, Discussion, Education, Exploration, Limit-setting, Orientation, Problem-solving, Rapport Building, Dance movement psychotherapisteality Testing, Socialization and Support  Summary of Progress/Problems: The topic for group therapy was feelings about diagnosis.  Pt actively participated in group discussion on their past and current diagnosis and how they feel towards this.  Pt also identified how society and family members judge them, based on their diagnosis as well as stereotypes and stigmas.  Patient discussed that he initially had difficulty accepting his diagnosis of Bipolar disorder as he did not want to take medications long term to manage his symptoms. Patient shared that he has come to a place of acceptance with his diagnosis and discussed how mental illness runs in his family. Patient identified finances as a stressor. CSW and other group members provided patient with emotional support and encouragement.  Samuella BruinKristin Verleen Stuckey, MSW, [employer]CSWA Clinical Social Worker [facility] [phone]

## 2015-11-20 NOTE — Progress Notes (Addendum)
Inpatient Diabetes Program Recommendations  AACE/ADA: New Consensus Statement on Inpatient Glycemic Control (2015)  Target Ranges:  Prepandial:   less than 140 mg/dL      Peak postprandial:   less than 180 mg/dL (1-2 hours)      Critically ill patients:  140 - 180 mg/dL    Results for Rands, Madoc Arma HeadingL JR. (MRN [id number]) as of 11/20/2015 13:31  Ref. Range 11/18/2015 06:17 11/18/2015 11:54 11/18/2015 17:00 11/18/2015 20:36  Glucose-Capillary Latest Ref Range: 65-99 mg/dL 784284 (H) 696332 (H) 295158 (H) 286 (H)    Results for Mullan, Alvaro Arma HeadingL JR. (MRN [id number]) as of 11/20/2015 13:31  Ref. Range 11/19/2015 05:47 11/19/2015 16:22 11/19/2015 17:29 11/19/2015 20:40  Glucose-Capillary Latest Ref Range: 65-99 mg/dL 102290 (H) 725425 (H) 366395 (H) 176 (H)    Results for Dorce, Cordera Arma HeadingL JR. (MRN [id number]) as of 11/20/2015 13:31  Ref. Range 11/20/2015 06:01 11/20/2015 11:55  Glucose-Capillary Latest Ref Range: 65-99 mg/dL 956287 (H) 387259 (H)    Admit with:  Bipolar disorder/ Suicidal Thoughts  History: DM, ETOH  Home DM Meds: V-Go 20 Disposable Insulin Pump       Actos 30 mg daily  Current Insulin Orders: Novolog Moderate SSI (0-15 units) TID AC + Custom HS scale (2-8 units at bedtime)       Novolog 6 units tidwc (meal coverage)      Actos 30 mg daily          -Patient normally uses V-Go 20 disposable insulin pump at home for CBG control.  This disposable insulin pump provides patient with a dose of 20 units for basal dose (delivered at 0.83 units/hour over 24 hour period).  Patient can also self deliver bolus insulin in 2 unit increments with this pump.  -Note patient currently not using V-Go insulin pump as insulin pumps are not allowed to be used for patients on the [facility] per insulin pump policy.    -Note Novolog meal coverage increased to 6 units tidwc today.     MD- Please start Lantus 20 units QHS for this patient to cover his basal insulin needs while off insulin pump  Addendum 1347: Spoke with  Serena ColonelAggie Nwoko[facility].  Discussed patient with NP.  Telephone order received to start Lantus 20 units QHS for this patient.  Order placed into [facility].      --Will follow patient during hospitalization--  Ambrose FinlandJeannine Johnston Manuelita Moxon RN, MSN, CDE Diabetes Coordinator Inpatient Glycemic Control Team Team Pager: [phone] (8a-5p)

## 2015-11-20 NOTE — Progress Notes (Signed)
D: Pt presents anxious on approach. Pt rates depression 5/10. Anxiety 3/10. Pt reports good sleep and appetite.  Pt denies suicidal thoughts. Pt stated goal "get through these med changes and work on discharge date". Pt compliant with taking meds and attending groups.  A: Medications administered as ordered per MD. Verbal support given. Pt encouraged to attend groups. 15 minute checks performed for safety.  R: Pt receptive to tx.

## 2015-11-20 NOTE — Progress Notes (Signed)
Adult Psychoeducational Group Note  Date:  11/20/2015 Time:  3:07 AM  Group Topic/Focus:  Wrap-Up Group:   The focus of this group is to help patients review their daily goal of treatment and discuss progress on daily workbooks.  Participation Level:  Active  Participation Quality:  Attentive  Affect:  Appropriate  Cognitive:  Appropriate  Insight: Good  Engagement in Group:  Engaged  Modes of Intervention:  Activity  Additional Comments:  Patient rated his day a 7. Goal is to talk to doctor about medication.  [name] 11/20/2015, 3:07 AM

## 2015-11-20 NOTE — Progress Notes (Signed)
Patient ID: [name]., male   DOB: [birth year], 40 y.o.   MRN: [id number] [facility] MD Progress Note  11/20/2015 12:24 PM [name].  MRN:  [id number]  Subjective:  Patient states he is feeling better than on admission and is starting to focus more on discharge planning. States he plans to return to his apartment and return to work soon. Regarding his SO, states " I am just going to stay away from her and focus on myself ". Denies medication side effects. Expresses some concern about fluctuating glucose serum levels , states he " can tell when my sugar is really high because I feel sluggish".    Objective:  I have discussed case with treatment team and have met with patient. Today patient presents improved compared to initial presentation- mood is improved , affect is improved . Has not exhibited any agitated or angry outbursts on unit . Denies medication side effects at this time. Thus far tolerating Cymbalta taper/ Zoloft trial well.  He is visible on unit and going to groups. We discussed history of substance abuse - patient states he has been sober for several months, and states he plans to return to regular 12 step meetings after discharge. Overall, seems more focused on returning to work , improving his current situation, and less ruminative, less focused on ex GF/relationship.     Principal Problem: Bipolar I disorder, most recent episode depressed ([place]) Diagnosis:   Patient Active Problem List   Diagnosis Date Noted  . Bipolar 1 disorder, depressed ([place]) [F31.9] 11/15/2015  . Bipolar disorder with depression ([place]) [F31.30] 07/31/2015  . Insomnia [G47.00]   . Sinusitis, acute maxillary [J01.00] 02/06/2015  . Headache syndrome [G44.89] 02/06/2015  . Atypical pneumonia [J18.9] 01/18/2015  . Renal mass [N28.89] 12/20/2014  . Lung nodule [R91.1] 12/07/2014  . Muscle spasms of neck [M62.48] 10/17/2014  . Bipolar I disorder, most recent episode depressed ([place]) [F31.30]    . Alcohol dependence with alcohol-induced mood disorder ([place]) [F10.24]   . MDD (major depressive disorder), severe ([place]) [F32.2] 09/19/2014  . Hyponatremia [E87.1] 08/15/2014  . Abscess [L02.91] 08/15/2014  . Loss of weight [R63.4] 05/26/2014  . Leukocytosis, unspecified [D72.829] 05/26/2014  . Rash and nonspecific skin eruption [R21] 04/21/2014  . Unspecified episodic mood disorder [F39] 03/21/2014  . Alcohol dependence ([place]) [F10.20] 03/21/2014  . Alcohol abuse [F10.10] 03/20/2014  . Cocaine abuse [F14.10] 03/20/2014  . Neck pain [M54.2] 01/31/2014  . Rotator cuff dysfunction [M75.100] 11/13/2013  . Closed fracture of unspecified phalanx or phalanges of hand [S62.609A] 10/04/2013  . Generalized anxiety disorder [F41.1] 08/30/2013  . Anxiety disorder [F41.9] 08/29/2013  . Admitted with dehydration [Z78.9] 08/29/2013  . Gastroenteritis [K52.9] 08/29/2013  . Malnutrition of mild degree ([place]) [E44.1] 08/29/2013  . Major depressive disorder, recurrent episode, severe ([place]) [F33.2] 07/14/2013  . HYPOGONADISM [E29.1] 08/28/2009  . 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 Diabetes type 2, uncontrolled ([place]) [E11.65] 09/30/2006  . HYPERLIPIDEMIA [E78.5] 09/30/2006  . TOBACCO ABUSE [F17.200] 09/30/2006  . Essential hypertension [I10] 09/30/2006  . ALLERGIC RHINITIS [J30.9] 09/30/2006  . COPD (chronic obstructive pulmonary disease) ([place]) [J44.9] 09/30/2006  . GERD [K21.9] 09/30/2006  . 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 Backache [M54.9] 09/30/2006  . FIBROMYALGIA [IMO0001] 09/30/2006  . FATTY LIVER DISEASE, HX OF [Z87.19] 09/30/2006   Total Time spent with patient:  59mnutes   Past Psychiatric History: depression  Past Medical History:  Past Medical History  Diagnosis Date  .  Allergy   . Depression   . GERD (gastroesophageal reflux disease)   . Hyperlipidemia   . Chronic pain disorder     Sees [facility]  . Urinary  incontinence     detrusor instability  . Hypogonadism   . Diabetes mellitus     Type II  . Chronic neck pain   . Bipolar depression ([place])   . Opiate dependence, continuous ([place])   . Polysubstance abuse   . Drug-seeking behavior     Past Surgical History  Procedure Laterality Date  . Appendectomy    . Hernia repair    . Nasal sinus surgery  2008   Family History:  Family History  Problem Relation Age of Onset  . Diabetes Mother   . Hypertension Mother   . Cirrhosis Mother   . Depression Mother   . Bipolar disorder Mother   . Arthritis Father 31    osteoarthritis  . Bipolar disorder Father   . Diabetes Sister     borderline  . Heart disease Paternal Uncle 35    MI  . Heart disease Maternal Grandfather     late 60's--MI  . Cancer Paternal Grandmother 4    lung  . Bipolar disorder Paternal Grandmother   . Heart disease Paternal Grandfather 52    MI  . Bipolar disorder Paternal Grandfather    Family Psychiatric  History: see above Social History:  History  Alcohol Use No     History  Drug Use No    Social History   Social History  . Marital Status: Legally Separated    Spouse Name: N/A  . Number of Children: 2  . Years of Education: N/A   Occupational History  . Disabled     chronic pain   Social History Main Topics  . Smoking status: Current Every Day Smoker -- 2.50 packs/day    Types: Cigarettes  . Smokeless tobacco: Never Used  . Alcohol Use: No  . Drug Use: No  . Sexual Activity: Not Asked   Other Topics Concern  . None   Social History Narrative   Additional Social History:    Pain Medications: SEE MAR Prescriptions: SEE MAR Over the Counter: SEE MAR History of alcohol / drug use?: Yes Longest period of sobriety (when/how long): 10-11 yrs Negative Consequences of Use: Personal relationships Name of Substance 1: Cocaine  1 - Age of First Use: 40 yrs old  1 - Amount (size/oz): n/a 1 - Frequency: n/a 1 - Duration: n/a 1 - Last Use /  Amount: 9 months ago    Sleep: improving   Appetite:   improving  Current Medications: Current Facility-Administered Medications  Medication Dose Route Frequency Provider Last Rate Last Dose  . acetaminophen (TYLENOL) tablet 650 mg  650 mg Oral Q6H PRN Delfin Gant, NP   650 mg at 11/20/15 1123  . alum & mag hydroxide-simeth (MAALOX/MYLANTA) 200-200-20 MG/5ML suspension 30 mL  30 mL Oral Q4H PRN Delfin Gant, NP      . cyclobenzaprine (FLEXERIL) tablet 10 mg  10 mg Oral BID Kerrie Buffalo, NP   10 mg at 11/20/15 0801  . DULoxetine (CYMBALTA) DR capsule 30 mg  30 mg Oral Daily Jenne Campus, MD   30 mg at 11/20/15 0801  . fluticasone (FLONASE) 50 MCG/ACT nasal spray 1 spray  1 spray Each Nare Daily Harriet Butte, NP   1 spray at 11/20/15 0801  . Fluticasone Furoate-Vilanterol 100-25 MCG/INH AEPB 1 puff  1 puff Inhalation Daily Harriet Butte, NP   1 puff at 11/16/15 0850  . gabapentin (NEURONTIN) tablet 800 mg  800 mg Oral TID Harriet Butte, NP   800 mg at 11/20/15 1222  . hydrOXYzine (ATARAX/VISTARIL) tablet 50 mg  50 mg Oral TID PRN Harriet Butte, NP   50 mg at 11/18/15 0103  . ibuprofen (ADVIL,MOTRIN) tablet 600 mg  600 mg Oral Q6H PRN Kerrie Buffalo, NP   600 mg at 11/20/15 0853  . insulin aspart (novoLOG) injection 0-15 Units  0-15 Units Subcutaneous TID WC Harriet Butte, NP   8 Units at 11/20/15 1223  . insulin aspart (novoLOG) injection 2-8 Units  2-8 Units Subcutaneous QHS Royetta Asal, RPH   4 Units at 11/19/15 2132  . insulin aspart (novoLOG) injection 6 Units  6 Units Subcutaneous TID WC Encarnacion Slates, NP   6 Units at 11/20/15 1222  . ipratropium-albuterol (DUONEB) 0.5-2.5 (3) MG/3ML nebulizer solution 3 mL  3 mL Nebulization Q4H PRN Harriet Butte, NP      . lamoTRIgine (LAMICTAL) tablet 300 mg  300 mg Oral Daily Harriet Butte, NP   300 mg at 11/20/15 0800  . lidocaine (LIDODERM) 5 % 1 patch  1 patch Transdermal Q24H Kerrie Buffalo, NP   1 patch at  11/19/15 1632  . loratadine (CLARITIN) tablet 10 mg  10 mg Oral Daily Harriet Butte, NP   10 mg at 11/20/15 0801  . lurasidone (LATUDA) tablet 40 mg  40 mg Oral BID Kerrie Buffalo, NP   40 mg at 11/20/15 0801  . magnesium hydroxide (MILK OF MAGNESIA) suspension 30 mL  30 mL Oral Daily PRN Delfin Gant, NP      . mirabegron ER (MYRBETRIQ) tablet 50 mg  50 mg Oral Daily Harriet Butte, NP   50 mg at 11/20/15 0801  . nicotine (NICODERM CQ - dosed in mg/24 hours) patch 21 mg  21 mg Transdermal Daily Hampton Abbot, MD   21 mg at 11/20/15 0800  . pioglitazone (ACTOS) tablet 30 mg  30 mg Oral Daily Harriet Butte, NP   30 mg at 11/20/15 0800  . ramipril (ALTACE) capsule 2.5 mg  2.5 mg Oral Daily Harriet Butte, NP   2.5 mg at 11/20/15 0800  . sertraline (ZOLOFT) tablet 50 mg  50 mg Oral Daily Jenne Campus, MD   50 mg at 11/20/15 0801  . simvastatin (ZOCOR) tablet 10 mg  10 mg Oral QHS Jenne Campus, MD   10 mg at 11/19/15 2133  . traZODone (DESYREL) tablet 100 mg  100 mg Oral QHS PRN Jenne Campus, MD   100 mg at 11/19/15 2133    Lab Results:  Results for orders placed or performed during the hospital encounter of 11/15/15 (from the past 48 hour(s))  Glucose, capillary     Status: Abnormal   Collection Time: 11/18/15  5:00 PM  Result Value Ref Range   Glucose-Capillary 158 (H) 65 - 99 mg/dL   Comment 1 Notify RN    Comment 2 Document in Chart   Glucose, capillary     Status: Abnormal   Collection Time: 11/18/15  8:36 PM  Result Value Ref Range   Glucose-Capillary 286 (H) 65 - 99 mg/dL  Glucose, capillary     Status: Abnormal   Collection Time: 11/19/15  5:47 AM  Result Value Ref Range   Glucose-Capillary 290 (H) 65 - 99 mg/dL  Comment 1 Notify RN   Glucose, capillary     Status: Abnormal   Collection Time: 11/19/15  4:22 PM  Result Value Ref Range   Glucose-Capillary 425 (H) 65 - 99 mg/dL   Comment 1 Notify RN   Glucose, capillary     Status: Abnormal   Collection  Time: 11/19/15  5:29 PM  Result Value Ref Range   Glucose-Capillary 395 (H) 65 - 99 mg/dL  Glucose, capillary     Status: Abnormal   Collection Time: 11/19/15  8:40 PM  Result Value Ref Range   Glucose-Capillary 176 (H) 65 - 99 mg/dL  Glucose, capillary     Status: Abnormal   Collection Time: 11/20/15  6:01 AM  Result Value Ref Range   Glucose-Capillary 287 (H) 65 - 99 mg/dL  Glucose, capillary     Status: Abnormal   Collection Time: 11/20/15 11:55 AM  Result Value Ref Range   Glucose-Capillary 259 (H) 65 - 99 mg/dL    Physical Findings: AIMS: Facial and Oral Movements Muscles of Facial Expression: None, normal Lips and Perioral Area: None, normal Jaw: None, normal Tongue: None, normal,Extremity Movements Upper (arms, wrists, hands, fingers): None, normal Lower (legs, knees, ankles, toes): None, normal, Trunk Movements Neck, shoulders, hips: None, normal, Overall Severity Severity of abnormal movements (highest score from questions above): None, normal Incapacitation due to abnormal movements: None, normal Patient's awareness of abnormal movements (rate only patient's report): No Awareness, Dental Status Current problems with teeth and/or dentures?: No Does patient usually wear dentures?: No  CIWA:  CIWA-Ar Total: 0 COWS:  COWS Total Score: 0  Musculoskeletal: Strength & Muscle Tone: within normal limits Gait & Station: normal Patient leans: N/A  Psychiatric Specialty Exam: Review of Systems  Psychiatric/Behavioral: Positive for depression. The patient is nervous/anxious.   denies headache, denies chest pain, no SOB  Blood pressure 109/75, pulse 101, temperature 97.2 F (36.2 C), temperature source Oral, resp. rate 16, height _0  (1.854 m), weight 202 lb (91.627 kg), SpO2 98 %.Body mass index is 26.66 kg/(m^2).   General Appearance:  Well groomed  Eye Contact:: Good  Speech: Normal Rate  Volume: Normal  Mood: improving   Affect:reactive   Thought  Process: Coherent  Orientation: Full (Time, Place, and Person)  Thought Content: no hallucinations, no delusions   Suicidal Thoughts: denies any current plan or intention of hurting self or of SI  Homicidal Thoughts: denies any homicidal or violent ideations, also specifically denies any homicidal ideations towards ex GF   Memory: recent and remote grossly intact   Judgement: improving   Insight:  Improving   Psychomotor Activity: Normal  Concentration: Good  Recall: Good  Fund of Knowledge:Good  Language: Good  Akathisia: Negative  Handed: Right  AIMS (if indicated):   Assets: Desire for Improvement Resilience Social Support  ADL's: Intact  Cognition: WNL  Sleep: Number of Hours: 7          Assessment - patient continues to improve compared to admission- he presents with fuller range of affect, less depression, and improving anxiety symptoms- he is more future oriented , less anxious, less focused on recent relationship stressors . Thus far tolerating Zoloft, Latuda well, and is tapering off Cymbalta, which he states he feels had stopped working for him.  Treatment Plan Summary: Encourage ongoing group , milieu participation to work on coping skills and symptom reduction Latuda 40 mg BID for mood disorder  Decrease  Cymbalta  Further to 20 mg QDAY initially Continue Zoloft  50 mgrs QDAY for depression, anxiety  Lamictal 300 mg QDAY for mood disorder  Flexeril 10 mg BID back pain Continue Trazodone 100 mg QHS  PRN for insomnia Continue Actos and Insulin Sliding Scale for management of DM- Meal coverage insulin has been added for better glycemic control. Treatment team working on disposition planning . Neita Garnet MD  11/20/2015, 12:24 PM

## 2015-11-20 NOTE — Progress Notes (Signed)
CBG - 410 this evening. PA on-call notified. No new orders received

## 2015-11-20 NOTE — Progress Notes (Signed)
D: Patient alert and oriented x 4. Patient denies pain/SI/HI/AVH. Patient states he has had a good day.  A: Staff to monitor Q 15 mins for safety. Encouragement and support offered. Scheduled medications administered per orders. R: Patient remains safe on the unit. Patient attended group tonight. Patient visible on the unit and interacting with peers. Patient taking administered medications.

## 2015-11-21 LAB — GLUCOSE, CAPILLARY
GLUCOSE-CAPILLARY: 264 mg/dL — AB (ref 65–99)
Glucose-Capillary: 352 mg/dL — ABNORMAL HIGH (ref 65–99)
Glucose-Capillary: 426 mg/dL — ABNORMAL HIGH (ref 65–99)
Glucose-Capillary: 326 mg/dL — ABNORMAL HIGH (ref 65–99)

## 2015-11-21 MED ORDER — SERTRALINE HCL 50 MG PO TABS: 50.0000 mg | ORAL_TABLET | Freq: Every day | ORAL | Status: DC

## 2015-11-21 MED ORDER — CYCLOBENZAPRINE HCL 10 MG PO TABS: 10.0000 mg | ORAL_TABLET | Freq: Two times a day (BID) | ORAL | Status: DC

## 2015-11-21 MED ORDER — FLUTICASONE PROPIONATE 50 MCG/ACT NA SUSP: 1.0000 | Freq: Every day | NASAL | Status: DC

## 2015-11-21 MED ORDER — FLUTICASONE FUROATE-VILANTEROL 100-25 MCG/INH IN AEPB: 1.0000 | INHALATION_SPRAY | Freq: Every day | RESPIRATORY_TRACT | Status: DC

## 2015-11-21 MED ORDER — IPRATROPIUM-ALBUTEROL 0.5-2.5 (3) MG/3ML IN SOLN: 3.0000 mL | RESPIRATORY_TRACT | Status: DC | PRN

## 2015-11-21 MED ORDER — DULOXETINE HCL 20 MG PO CPEP: 20.0000 mg | ORAL_CAPSULE | Freq: Every day | ORAL | Status: DC

## 2015-11-21 MED ORDER — HYDROXYZINE HCL 50 MG PO TABS: 50.0000 mg | ORAL_TABLET | Freq: Three times a day (TID) | ORAL | Status: DC | PRN

## 2015-11-21 MED ORDER — SIMVASTATIN 10 MG PO TABS: 10.0000 mg | ORAL_TABLET | Freq: Every day | ORAL | Status: DC

## 2015-11-21 MED ORDER — TRAZODONE HCL 100 MG PO TABS: 100.0000 mg | ORAL_TABLET | Freq: Every evening | ORAL | Status: DC | PRN

## 2015-11-21 MED ORDER — DULOXETINE HCL 20 MG PO CPEP
20.0000 mg | ORAL_CAPSULE | Freq: Every day | ORAL | Status: DC
  Filled 2015-11-21: qty 1

## 2015-11-21 MED ORDER — CETIRIZINE HCL 10 MG PO TABS: 10.0000 mg | ORAL_TABLET | Freq: Every day | ORAL | Status: DC

## 2015-11-21 MED ORDER — PIOGLITAZONE HCL 30 MG PO TABS: ORAL_TABLET | ORAL | Status: DC

## 2015-11-21 MED ORDER — INSULIN GLARGINE 100 UNIT/ML ~~LOC~~ SOLN: 20.0000 [IU] | Freq: Every day | SUBCUTANEOUS | Status: DC

## 2015-11-21 MED ORDER — LURASIDONE HCL 40 MG PO TABS: 40.0000 mg | ORAL_TABLET | Freq: Two times a day (BID) | ORAL | Status: DC

## 2015-11-21 MED ORDER — DULOXETINE HCL 30 MG PO CPEP: 30.0000 mg | ORAL_CAPSULE | Freq: Every day | ORAL | Status: DC

## 2015-11-21 MED ORDER — LIDOCAINE 5 % EX PTCH: 1.0000 | MEDICATED_PATCH | CUTANEOUS | Status: DC

## 2015-11-21 MED ORDER — NICOTINE 21 MG/24HR TD PT24: 21.0000 mg | MEDICATED_PATCH | Freq: Every day | TRANSDERMAL | Status: DC

## 2015-11-21 MED ORDER — V-GO 20 KIT: PACK | Status: DC

## 2015-11-21 MED ORDER — RAMIPRIL 2.5 MG PO CAPS: 2.5000 mg | ORAL_CAPSULE | Freq: Every day | ORAL | Status: DC

## 2015-11-21 MED ORDER — LAMOTRIGINE 150 MG PO TABS: 300.0000 mg | ORAL_TABLET | Freq: Every day | ORAL | Status: DC

## 2015-11-21 MED ORDER — GABAPENTIN 800 MG PO TABS: 800.0000 mg | ORAL_TABLET | Freq: Three times a day (TID) | ORAL | Status: DC

## 2015-11-21 MED ORDER — MIRABEGRON ER 50 MG PO TB24: 50.0000 mg | ORAL_TABLET | Freq: Every day | ORAL | Status: DC

## 2015-11-21 MED ORDER — INSULIN ASPART 100 UNIT/ML ~~LOC~~ SOLN: SUBCUTANEOUS | Status: DC

## 2015-11-21 NOTE — [facility] Group Notes (Signed)
[facility]  11/21/2015 8:45 AM  Participation Quality: Alert, Appropriate and Oriented  Mood/Affect: Appropriate  Depression Rating: 2  Anxiety Rating: 2  Thoughts of Suicide: Pt denies SI/HI  Will you contract for safety? Yes  Current AVH: Pt denies  Plan for Discharge/Comments: Pt attended discharge planning group and actively participated in group. CSW discussed suicide prevention education with the group and encouraged them to discuss discharge planning and any relevant barriers. Pt reports feeling better and expresses that he is ready for DC. Pt requesting work and attorney note for DC.  Transportation Means: Pt reports access to transportation  Supports: No supports mentioned at this time  Chad CordialLauren Carter, LCSWA 11/21/2015 9:41 AM

## 2015-11-21 NOTE — Progress Notes (Signed)
  [facility] Adult Case Management Discharge Plan :  Will you be returning to the same living situation after discharge:  Yes,  Pt is returning home At discharge, do you have transportation home?: Yes,  Pt parents to pick up Do you have the ability to pay for your medications: Yes,  Pt provided with prescriptions  Release of information consent forms completed and in the chart;  Patient's signature needed at discharge.  Patient to Follow up at: Follow-up Information    Follow up with [facility].   Why:  Please call on 1/12 to schedule your appointment.    Contact information:   [address] [place]  [address] [phone]       Follow up with [facility].   Why:  You will receive an intake form from this provider through your email address ([email or web address]); please fill this out promptly following DC and they will follow-up with you in 2-3 business days to confirm an appointment time.   Contact information:   [address] [place], [place] [address]  ph: [phone] f: [phone]      Next level of care provider has access to [facility] Link:no  Safety Planning and Suicide Prevention discussed: Yes,  with Pt; 2 unsuccessful contact attempts with mother  Have you used any form of tobacco in the last 30 days? (Cigarettes, Smokeless Tobacco, Cigars, and/or Pipes): Yes  Has patient been referred to the Quitline?: Patient refused referral  Patient has been referred for addiction treatment: Yes- see above  Elaina Hoops 11/21/2015, 11:07 AM

## 2015-11-21 NOTE — [facility] Suicide Risk Assessment (Signed)
[facility] INPATIENT:  Family/Significant Other Suicide Prevention Education  Suicide Prevention Education:  Contact Attempts: Delana MeyerSheila Vermalia, [name] mother [phone], has been identified by the patient as the family member/significant other with whom the patient will be residing, and identified as the person(s) who will aid the patient in the event of a mental health crisis.  With written consent from the patient, two attempts were made to provide suicide prevention education, prior to and/or following the patient's discharge.  We were unsuccessful in providing suicide prevention education.  A suicide education pamphlet was given to the patient to share with family/significant other.  Date and time of first attempt: 11/19/15 @ 10:30am Date and time of second attempt: 11/20/15 @ 3:00pm  Elaina Hoopsarter, [place] M 11/21/2015, 8:33 AM

## 2015-11-21 NOTE — [facility] Suicide Risk Assessment (Addendum)
[facility]   Demographic Factors:  40 year old male, lives with daughter, employed.   Total Time spent with patient: 30 minutes  Musculoskeletal: Strength & Muscle Tone: within normal limits Gait & Station: normal Patient leans: N/A  Psychiatric Specialty Exam: Physical Exam  ROS  Blood pressure 104/73, pulse 102, temperature 97.5 F (36.4 C), temperature source Oral, resp. rate 16, height 6\' 1"  (1.854 m), weight 202 lb (91.627 kg), SpO2 98 %.Body mass index is 26.66 kg/(m^2).  General Appearance: Well Groomed  Patent attorneyye Contact::  Good  Speech:  Normal Rate409  Volume:  Normal  Mood:  improved- denies depression  Affect:  Appropriate  Thought Process:  Linear  Orientation:  Full (Time, Place, and Person)  Thought Content:  denies hallucinations, no delusions   Suicidal Thoughts:  No- denies any suicidal ideations, denies any self injurious ideations   Homicidal Thoughts:  No- denies any homicidal or violent ideations, and specifically also denies violent ideations towards ex GF .  Memory:  recent and remote grossly intact   Judgement:  Other:  improved   Insight:  improved   Psychomotor Activity:  Normal  Concentration:  Good  Recall:  Good  Fund of Knowledge:Good  Language: Good  Akathisia:  Negative  Handed:  Right  AIMS (if indicated):     Assets:  Communication Skills Desire for Improvement Resilience Vocational/Educational  Sleep:  Number of Hours: 6.75  Cognition: WNL  ADL's:  Intact   Have you used any form of tobacco in the last 30 days? (Cigarettes, Smokeless Tobacco, Cigars, and/or Pipes): Yes  Has this patient used any form of tobacco in the last 30 days? (Cigarettes, Smokeless Tobacco, Cigars, and/or Pipes) Yes, A prescription for an FDA-approved tobacco cessation medication was offered at discharge and the patient refused  Mental Status Per Nursing Assessment::   On Admission:     Current Mental Status by Physician: At this  time patient improved compared to admission- mood improved and currently denying depression, affect more reactive, no thought disorder, no SI or HI, no psychotic symptoms, future oriented, planning on returning to work as of next week.  Loss Factors: Recent break up with GF several weeks ago, legal issues .   Historical Factors: History of mood disorder, has been diagnosed with bipolar disorder, prior psychiatric admissions, history of cocaine and alcohol abuse, currently sober x 9 months  Risk Reduction Factors:   Responsible for children under 218 years of age, Sense of responsibility to family, Positive social support and Positive coping skills or problem solving skills  Continued Clinical Symptoms:  As noted above, currently improved   Cognitive Features That Contribute To Risk:  No gross cognitive deficits noted upon discharge. Is alert , attentive, and oriented x 3   Suicide Risk:  Mild:  Suicidal ideation of limited frequency, intensity, duration, and specificity.  There are no identifiable plans, no associated intent, mild dysphoria and related symptoms, good self-control (both objective and subjective assessment), few other risk factors, and identifiable protective factors, including available and accessible social support.  Principal Problem: Bipolar I disorder, most recent episode depressed [facility](HCC) Discharge Diagnoses:  Patient Active Problem List   Diagnosis Date Noted  . Bipolar 1 disorder, depressed (HCC) [F31.9] 11/15/2015  . Bipolar disorder with depression (HCC) [F31.30] 07/31/2015  . Insomnia [G47.00]   . Sinusitis, acute maxillary [J01.00] 02/06/2015  . Headache syndrome [G44.89] 02/06/2015  . Atypical pneumonia [J18.9] 01/18/2015  . Renal mass [N28.89] 12/20/2014  . Lung  nodule [R91.1] 12/07/2014  . Muscle spasms of neck [M62.48] 10/17/2014  . Bipolar I disorder, most recent episode depressed (HCC) [F31.30]   . Alcohol dependence with alcohol-induced mood disorder  (HCC) [F10.24]   . MDD (major depressive disorder), severe (HCC) [F32.2] 09/19/2014  . Hyponatremia [E87.1] 08/15/2014  . Abscess [L02.91] 08/15/2014  . Loss of weight [R63.4] 05/26/2014  . Leukocytosis, unspecified [D72.829] 05/26/2014  . Rash and nonspecific skin eruption [R21] 04/21/2014  . Unspecified episodic mood disorder [F39] 03/21/2014  . Alcohol dependence (HCC) [F10.20] 03/21/2014  . Alcohol abuse [F10.10] 03/20/2014  . Cocaine abuse [F14.10] 03/20/2014  . Neck pain [M54.2] 01/31/2014  . Rotator cuff dysfunction [M75.100] 11/13/2013  . Closed fracture of unspecified phalanx or phalanges of hand [S62.609A] 10/04/2013  . Generalized anxiety disorder [F41.1] 08/30/2013  . Anxiety disorder [F41.9] 08/29/2013  . Admitted with dehydration [Z78.9] 08/29/2013  . Gastroenteritis [K52.9] 08/29/2013  . Malnutrition of mild degree (HCC) [E44.1] 08/29/2013  . Major depressive disorder, recurrent episode, severe (HCC) [F33.2] 07/14/2013  . HYPOGONADISM [E29.1] 08/28/2009  . 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 Diabetes type 2, uncontrolled (HCC) [E11.65] 09/30/2006  . HYPERLIPIDEMIA [E78.5] 09/30/2006  . TOBACCO ABUSE [F17.200] 09/30/2006  . Essential hypertension [I10] 09/30/2006  . ALLERGIC RHINITIS [J30.9] 09/30/2006  . COPD (chronic obstructive pulmonary disease) (HCC) [J44.9] 09/30/2006  . GERD [K21.9] 09/30/2006  . 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 Backache [M54.9] 09/30/2006  . FIBROMYALGIA [IMO0001] 09/30/2006  . FATTY LIVER DISEASE, HX OF [Z87.19] 09/30/2006    Follow-up Information    Follow up with [facility].   Why:  Scheduled appointment   Contact information:   [address] [place]  [address] [phone]       Follow up with [facility].   Why:  You will receive an intake form from this provider through your email address ([email or web address]); please fill this out  promptly following DC and they will follow-up with you in 2-3 business days to confirm an appointment time.   Contact information:   [address] [place], [place] [address]  ph: [phone] f: [phone]      Plan Of Care/Follow-up recommendations:  Activity:  as tolerated  Diet:  diabetic diet  Tests:  NA Other:  See below   Is patient on multiple antipsychotic therapies at discharge:  No   Has Patient had three or more failed trials of antipsychotic monotherapy by history:  No  Recommended Plan for Multiple Antipsychotic Therapies: NA Patient is leaving unit in good spirits. Plans to return home Plans to follow up as above Plans to follow up with Dr. Lucianne Muss , his endocrinologist, at [facility], for management of DM. * Patient currently in the process of gradually tapering off Cymbalta, and is on Zoloft, which is new antidepressant trial for him. Thus far has had no side effects and is tolerating this cross taper well- he plans to continue medication adjustments with outpatient psychiatrist .  Nehemiah Massed 11/21/2015, 10:56 AM

## 2015-11-21 NOTE — Progress Notes (Signed)
Adult Psychoeducational Group Note  Date:  11/21/2015 Time:  1:50 AM  Group Topic/Focus:  Wrap-Up Group:   The focus of this group is to help patients review their daily goal of treatment and discuss progress on daily workbooks.  Participation Level:  Active  Participation Quality:  Appropriate  Affect:  Appropriate  Cognitive:  Appropriate  Insight: Good  Engagement in Group:  Engaged  Modes of Intervention:  Activity  Additional Comments:  Patient rated his day a 9. Goal was to talk with doctor about medication and get ready for discharge tomorrow.  Claria DiceKiara M Shanea Karney 11/21/2015, 1:50 AM

## 2015-11-21 NOTE — Discharge Summary (Signed)
Physician Discharge Summary Note  Patient:  [name]. is an 40 y.o., male MRN:  [id number] DOB:  11/27/1975 Patient phone:  [phone] (home)  Patient address:   [place] [address],  Total Time spent with patient: Greater than 30 minutes  Date of Admission:  11/15/2015  Date of Discharge: 11-21-15   Reason for Admission:  Suicidal ideations  Principal Problem: Bipolar I disorder, most recent episode depressed [facility])  Discharge Diagnoses: Patient Active Problem List   Diagnosis Date Noted  . Bipolar 1 disorder, depressed ([place]) [F31.9] 11/15/2015  . Bipolar disorder with depression ([place]) [F31.30] 07/31/2015  . Insomnia [G47.00]   . Sinusitis, acute maxillary [J01.00] 02/06/2015  . Headache syndrome [G44.89] 02/06/2015  . Atypical pneumonia [J18.9] 01/18/2015  . Renal mass [N28.89] 12/20/2014  . Lung nodule [R91.1] 12/07/2014  . Muscle spasms of neck [M62.48] 10/17/2014  . Bipolar I disorder, most recent episode depressed ([place]) [F31.30]   . Alcohol dependence with alcohol-induced mood disorder ([place]) [F10.24]   . MDD (major depressive disorder), severe ([place]) [F32.2] 09/19/2014  . Hyponatremia [E87.1] 08/15/2014  . Abscess [L02.91] 08/15/2014  . Loss of weight [R63.4] 05/26/2014  . Leukocytosis, unspecified [D72.829] 05/26/2014  . Rash and nonspecific skin eruption [R21] 04/21/2014  . Unspecified episodic mood disorder [F39] 03/21/2014  . Alcohol dependence ([place]) [F10.20] 03/21/2014  . Alcohol abuse [F10.10] 03/20/2014  . Cocaine abuse [F14.10] 03/20/2014  . Neck pain [M54.2] 01/31/2014  . Rotator cuff dysfunction [M75.100] 11/13/2013  . Closed fracture of unspecified phalanx or phalanges of hand [S62.609A] 10/04/2013  . Generalized anxiety disorder [F41.1] 08/30/2013  . Anxiety disorder [F41.9] 08/29/2013  . Admitted with dehydration [Z78.9] 08/29/2013  . Gastroenteritis [K52.9] 08/29/2013  . Malnutrition of mild degree ([place]) [E44.1]  08/29/2013  . Major depressive disorder, recurrent episode, severe ([place]) [F33.2] 07/14/2013  . HYPOGONADISM [E29.1] 08/28/2009  . 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 Diabetes type 2, uncontrolled ([place]) [E11.65] 09/30/2006  . HYPERLIPIDEMIA [E78.5] 09/30/2006  . TOBACCO ABUSE [F17.200] 09/30/2006  . Essential hypertension [I10] 09/30/2006  . ALLERGIC RHINITIS [J30.9] 09/30/2006  . COPD (chronic obstructive pulmonary disease) ([place]) [J44.9] 09/30/2006  . GERD [K21.9] 09/30/2006  . 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 Backache [M54.9] 09/30/2006  . FIBROMYALGIA [IMO0001] 09/30/2006  . FATTY LIVER DISEASE, HX OF [Z87.19] 09/30/2006   Musculoskeletal: Strength & Muscle Tone: within normal limits Gait & Station: normal Patient leans: N/A  Psychiatric Specialty Exam: Physical Exam  Constitutional: He is oriented to person, place, and time. He appears well-developed.  HENT:  Head: Normocephalic.  Eyes: Pupils are equal, round, and reactive to light.  Neck: Normal range of motion.  Cardiovascular: Normal rate.   Hx. High blood pressure  Respiratory: Effort normal and breath sounds normal.  GI: Soft.  Genitourinary:  Denies any issues in this area  Musculoskeletal: Normal range of motion.  Neurological: He is alert and oriented to person, place, and time.  Skin: Skin is warm and dry.  Psychiatric: His speech is normal. His mood appears not anxious. His affect is not angry, not blunt, not labile and not inappropriate. He is not agitated, not aggressive, not hyperactive, not slowed, not withdrawn, not actively hallucinating and not combative. Thought content is not paranoid and not delusional. Cognition and memory are not impaired. He does not express impulsivity or inappropriate judgment. He does not exhibit a depressed mood. He expresses no homicidal and no suicidal ideation. He expresses no suicidal plans and  no homicidal plans. He exhibits normal  recent memory and normal remote memory. He is attentive.    Review of Systems  Constitutional: Negative.   HENT: Negative.   Eyes: Negative.   Respiratory: Negative.   Cardiovascular: Negative.        Hx. HTN  Gastrointestinal: Negative.   Genitourinary: Negative.   Musculoskeletal: Positive for myalgias (Stable) and joint pain (Stable).  Skin: Negative.   Neurological: Negative.   Endo/Heme/Allergies: Negative.   Psychiatric/Behavioral: Positive for depression (Stable) and substance abuse (Hx. Tobacco abuse). Negative for suicidal ideas, hallucinations and memory loss. The patient has insomnia (Stable). The patient is not nervous/anxious.   All other systems reviewed and are negative.   Blood pressure 104/73, pulse 102, temperature 97.5 F (36.4 C), temperature source Oral, resp. rate 16, height '6\' 1"'  (1.854 m), weight 91.627 kg (202 lb), SpO2 98 %.Body mass index is 26.66 kg/(m^2).  See Md's SRA   Have you used any form of tobacco in the last 30 days? (Cigarettes, Smokeless Tobacco, Cigars, and/or Pipes): Yes  Has this patient used any form of tobacco in the last 30 days? (Cigarettes, Smokeless Tobacco, Cigars, and/or Pipes) Yes, A prescription for an FDA-approved tobacco cessation medication was offered at discharge and the patient refused.   Past Medical History:  Past Medical History  Diagnosis Date  . Allergy   . Depression   . GERD (gastroesophageal reflux disease)   . Hyperlipidemia   . Chronic pain disorder     Sees [facility]  . Urinary incontinence     detrusor instability  . Hypogonadism   . Diabetes mellitus     Type II  . Chronic neck pain   . Bipolar depression ([place])   . Opiate dependence, continuous ([place])   . Polysubstance abuse   . Drug-seeking behavior     Past Surgical History  Procedure Laterality Date  . Appendectomy    . Hernia repair    . Nasal sinus surgery  2008   Family History:  Family History  Problem Relation Age of Onset   . Diabetes Mother   . Hypertension Mother   . Cirrhosis Mother   . Depression Mother   . Bipolar disorder Mother   . Arthritis Father 59    osteoarthritis  . Bipolar disorder Father   . Diabetes Sister     borderline  . Heart disease Paternal Uncle 42    MI  . Heart disease Maternal Grandfather     late 60's--MI  . Cancer Paternal Grandmother 27    lung  . Bipolar disorder Paternal Grandmother   . Heart disease Paternal Grandfather 5    MI  . Bipolar disorder Paternal Grandfather    Social History:  History  Alcohol Use No     History  Drug Use No    Social History   Social History  . Marital Status: Legally Separated    Spouse Name: N/A  . Number of Children: 2  . Years of Education: N/A   Occupational History  . Disabled     chronic pain   Social History Main Topics  . Smoking status: Current Every Day Smoker -- 2.50 packs/day    Types: Cigarettes  . Smokeless tobacco: Never Used  . Alcohol Use: No  . Drug Use: No  . Sexual Activity: Not Asked   Other Topics Concern  . None   Social History Narrative   Risk to Self: Is patient at risk for suicide?: Yes Risk to  Others: No Prior Inpatient Therapy: Yes Prior Outpatient Therapy: Yes  Level of Care:  OP  Hospital Course: Jacqulynn Cadet states that he met with Dr. Adele Schilder for a face to face outpatient appointment today. He reported to Dr. Adele Schilder that he was feeling increasingly depressed, anxious, and suicidal. Jacqulynn Cadet reports suicidal thoughts with no plan. He also has no intent, but not sure if he can contract for safety. He has no history of self mutilating behaviors. He does have a history of 2 prior suicide attempts (all by overdose). The suicide attempts were triggered by being off of his medications or the medications not working properly, also conflict within his marriage. Stressors include relationship conflict with ex-fiance and recently obtaining custody of his daughter. He reported several legal charges  (Assault on a male, trespassing and all the charges are related to his ex-fiance). He  has 3 pending court dates.   Jacqulynn Cadet was admitted to the [facility] adult unit for worsening symptoms of Bipolar  disorder, most recent episode, depressed leading to suicidal ideations. He reported other stressors in his life such as marital issues & some legal troubles including pending court dates. Jeffery required mood stabilization treatment. He was medicated & discharged on; Duloxetine 20 mg for depression, Gabapentin 800 mg for agitation, Hydroxyzine 50 mg for anxiety, Lamictal 150 mg for mood stabilization, Latuda 40 mg for mood control, Sertraline 50 mg for depression & Trazodone 100 mg for insomnia. His other pre-existing medical problems were identified and treated by resuming his pertinent home medications for those health issues. He tolerated his treatment regimen without any adverse effects or reactions. Jacqulynn Cadet was enrolled & participated in the group counseling sessions being offered & held on this unit. He learned coping skills.  During the course of his hospitalization, Jeffery's progress was monitored by observation & his daily report of symptom reduction. His emotional & mental status were assessed & monitored by daily self-inventory reports completed by him & the clinical staff. He was also evaluated daily by the treatment team for mood stability & plans for continued recovery after discharge. Jeffery's motivation was an integral factor his mood stability. He was offered further treatment options upon discharge on outpatient basis as listed below. He was provided with all the necessary information needed to make this appointment without problems. Upon discharge, he was both mentally & medically stable. He is adamantly denying suicidal/homicidal ideation, auditory/visual/tactile hallucinations, delusional thoughts & or paranoia. He left [facility] with all personal belongings in no apparent distress. Transportation per  parents.  Consults:  None and psychiatry  Significant Diagnostic Studies:  Glucose trending mid 100's to mid 300's, although improving with insulin management.   Discharge Vitals:   Blood pressure 104/73, pulse 102, temperature 97.5 F (36.4 C), temperature source Oral, resp. rate 16, height '6\' 1"'  (1.854 m), weight 91.627 kg (202 lb), SpO2 98 %. Body mass index is 26.66 kg/(m^2). Lab Results:   Results for orders placed or performed during the hospital encounter of 11/15/15 (from the past 72 hour(s))  Glucose, capillary     Status: Abnormal   Collection Time: 11/18/15 11:54 AM  Result Value Ref Range   Glucose-Capillary 332 (H) 65 - 99 mg/dL   Comment 1 Notify RN    Comment 2 Document in Chart   Glucose, capillary     Status: Abnormal   Collection Time: 11/18/15  5:00 PM  Result Value Ref Range   Glucose-Capillary 158 (H) 65 - 99 mg/dL   Comment 1 Notify RN  Comment 2 Document in Chart   Glucose, capillary     Status: Abnormal   Collection Time: 11/18/15  8:36 PM  Result Value Ref Range   Glucose-Capillary 286 (H) 65 - 99 mg/dL  Glucose, capillary     Status: Abnormal   Collection Time: 11/19/15  5:47 AM  Result Value Ref Range   Glucose-Capillary 290 (H) 65 - 99 mg/dL   Comment 1 Notify RN   Glucose, capillary     Status: Abnormal   Collection Time: 11/19/15  4:22 PM  Result Value Ref Range   Glucose-Capillary 425 (H) 65 - 99 mg/dL   Comment 1 Notify RN   Glucose, capillary     Status: Abnormal   Collection Time: 11/19/15  5:29 PM  Result Value Ref Range   Glucose-Capillary 395 (H) 65 - 99 mg/dL  Glucose, capillary     Status: Abnormal   Collection Time: 11/19/15  8:40 PM  Result Value Ref Range   Glucose-Capillary 176 (H) 65 - 99 mg/dL  Glucose, capillary     Status: Abnormal   Collection Time: 11/20/15  6:01 AM  Result Value Ref Range   Glucose-Capillary 287 (H) 65 - 99 mg/dL  Glucose, capillary     Status: Abnormal   Collection Time: 11/20/15 11:55 AM  Result  Value Ref Range   Glucose-Capillary 259 (H) 65 - 99 mg/dL  Glucose, capillary     Status: Abnormal   Collection Time: 11/20/15  5:09 PM  Result Value Ref Range   Glucose-Capillary 315 (H) 65 - 99 mg/dL  Glucose, capillary     Status: Abnormal   Collection Time: 11/20/15  9:28 PM  Result Value Ref Range   Glucose-Capillary 410 (H) 65 - 99 mg/dL  Glucose, capillary     Status: Abnormal   Collection Time: 11/21/15  6:06 AM  Result Value Ref Range   Glucose-Capillary 326 (H) 65 - 99 mg/dL   Physical Findings: AIMS: Facial and Oral Movements Muscles of Facial Expression: None, normal Lips and Perioral Area: None, normal Jaw: None, normal Tongue: None, normal,Extremity Movements Upper (arms, wrists, hands, fingers): None, normal Lower (legs, knees, ankles, toes): None, normal, Trunk Movements Neck, shoulders, hips: None, normal, Overall Severity Severity of abnormal movements (highest score from questions above): None, normal Incapacitation due to abnormal movements: None, normal Patient's awareness of abnormal movements (rate only patient's report): No Awareness, Dental Status Current problems with teeth and/or dentures?: No Does patient usually wear dentures?: No  CIWA:  CIWA-Ar Total: 0 COWS:  COWS Total Score: 0  See Psychiatric Specialty Exam and Suicide Risk Assessment completed by Attending Physician prior to discharge.  Discharge destination:  Home  Is patient on multiple antipsychotic therapies at discharge:  No   Has Patient had three or more failed trials of antipsychotic monotherapy by history:  No  Recommended Plan for Multiple Antipsychotic Therapies: NA    Medication List    TAKE these medications      Indication   cetirizine 10 MG tablet  Commonly known as:  ZYRTEC  Take 1 tablet (10 mg total) by mouth daily. For allergies   Indication:  Perennial Rhinitis, Hayfever     cyclobenzaprine 10 MG tablet  Commonly known as:  FLEXERIL  Take 1 tablet (10 mg  total) by mouth 2 (two) times daily. Muscle spasm   Indication:  Muscle Spasm     DULoxetine 20 MG capsule  Commonly known as:  CYMBALTA  Take 1 capsule (20 mg total) by  mouth daily. For depression  Start taking on:  11/22/2015   Indication:  Major Depressive Disorder     fluticasone 50 MCG/ACT nasal spray  Commonly known as:  FLONASE  Place 1 spray into both nostrils daily. For allergies   Indication:  Hayfever     Fluticasone Furoate-Vilanterol 100-25 MCG/INH Aepb  Commonly known as:  BREO ELLIPTA  Inhale 1 puff into the lungs daily. For shortness of breath   Indication:  Asthma     gabapentin 800 MG tablet  Commonly known as:  NEURONTIN  Take 1 tablet (800 mg total) by mouth 3 (three) times daily. For agitation   Indication:  Agitation, mood stabilization     hydrOXYzine 50 MG tablet  Commonly known as:  ATARAX/VISTARIL  Take 1 tablet (50 mg total) by mouth 3 (three) times daily as needed for anxiety.   Indication:  Anxiety     insulin aspart 100 UNIT/ML injection  Commonly known as:  novoLOG  Use max 58 units with V-go pump: For diabetes management   Indication:  Type 2 Diabetes     insulin glargine 100 UNIT/ML injection  Commonly known as:  LANTUS  Inject 0.2 mLs (20 Units total) into the skin at bedtime. For diabetes management   Indication:  Type 2 Diabetes     ipratropium-albuterol 0.5-2.5 (3) MG/3ML Soln  Commonly known as:  DUONEB  Take 3 mLs by nebulization every 4 (four) hours as needed (for wheezing/shortness of breath).   Indication:  Spasm of Lung Air Passages     lamoTRIgine 150 MG tablet  Commonly known as:  LAMICTAL  Take 2 tablets (300 mg total) by mouth daily. For mood stabilization   Indication:  Mood stabilization     lidocaine 5 %  Commonly known as:  LIDODERM  Place 1 patch onto the skin daily. Remove & Discard patch within 12 hours or as directed by MD: For pain management   Indication:  Pain management     lurasidone 40 MG Tabs tablet   Commonly known as:  LATUDA  Take 1 tablet (40 mg total) by mouth 2 (two) times daily. For mood control   Indication:  Mood control     mirabegron ER 50 MG Tb24 tablet  Commonly known as:  MYRBETRIQ  Take 1 tablet (50 mg total) by mouth daily. For bladder control   Indication:  Bladder control     nicotine 21 mg/24hr patch  Commonly known as:  NICODERM CQ - dosed in mg/24 hours  Place 1 patch (21 mg total) onto the skin daily. For smoking cessation   Indication:  Nicotine Addiction     pioglitazone 30 MG tablet  Commonly known as:  ACTOS  TAKE 1 TABLET (30 MG TOTAL) BY MOUTH DAILY: For Diabetes managment   Indication:  Type 2 Diabetes     ramipril 2.5 MG capsule  Commonly known as:  ALTACE  Take 1 capsule (2.5 mg total) by mouth daily. For high blood pressure   Indication:  High Blood Pressure     sertraline 50 MG tablet  Commonly known as:  ZOLOFT  Take 1 tablet (50 mg total) by mouth daily. For depression   Indication:  Major Depressive Disorder     simvastatin 10 MG tablet  Commonly known as:  ZOCOR  Take 1 tablet (10 mg total) by mouth at bedtime. For high cholesterol   Indication:  Inherited Heterozygous Hypercholesterolemia, Type II A Hyperlipidemia     traZODone 100 MG tablet  Commonly known as:  DESYREL  Take 1 tablet (100 mg total) by mouth at bedtime as needed for sleep.   Indication:  Trouble Sleeping     V-GO 20 Kit  USE AS DIRECTED: For diabetes management   Indication:  Diabetes management       Follow-up Information    Follow up with [facility].   Why:  Please call on 1/12 to schedule your appointment.    Contact information:   [place]  [address] [phone]       Follow up with [facility].   Why:  You will receive an intake form from this provider through your email address ([email or web address]); please fill this out promptly following DC and they will follow-up with you in 2-3  business days to confirm an appointment time.   Contact information:   [address] [place], [place] [address]  ph: [phone] f: [phone]     Follow-up recommendations: Activity:  As tolerated Diet: As recommended by your primary care doctor. Keep all scheduled follow-up appointments as recommended.  Comments:  Take all your medications as prescribed by your mental healthcare provider. Report any adverse effects and or reactions from your medicines to your outpatient provider promptly. Patient is instructed and cautioned to not engage in alcohol and or illegal drug use while on prescription medicines. In the event of worsening symptoms, patient is instructed to call the crisis hotline, 911 and or go to the nearest ED for appropriate evaluation and treatment of symptoms. Follow-up with your primary care provider for your other medical issues, concerns and or health care needs.   Total Discharge Time: Greater than 30 minutes  Signed: Encarnacion Slates, PMHNP, FNP-BC 11/21/2015, 11:31 AM   Patient seen, Suicide Assessment Completed.  Disposition Plan Reviewed

## 2015-11-21 NOTE — Tx Team (Signed)
Interdisciplinary Treatment Plan Update (Adult) Date: 11/21/2015   Date: 11/21/2015 10:50 AM  Progress in Treatment:  Attending groups: Yes  Participating in groups: Yes  Taking medication as prescribed: Yes  Tolerating medication: Yes  Family/Significant othe contact made: No, 2 unsuccessful contact attempts Patient understands diagnosis: Yes AEB seeking help with depression Discussing patient identified problems/goals with staff: Yes  Medical problems stabilized or resolved: Yes  Denies suicidal/homicidal ideation: Yes Patient has not harmed self or Others: Yes   New problem(s) identified: None identified at this time.   Discharge Plan or Barriers: Pt will return home and follow-up with [facility]; is open to referral at [place] for therapy.  Additional comments:  Patient and CSW reviewed pt's identified goals and treatment plan. Patient verbalized understanding and agreed to treatment plan. CSW reviewed [facility] "Discharge Process and Patient Involvement" Form. Pt verbalized understanding of information provided and signed form.   Reason for Continuation of Hospitalization:  Anxiety Depression Medication stabilization Suicidal ideation  Estimated length of stay: 0 days; Pt stable for DC  Review of initial/current patient goals per problem list:   1.  Goal(s): Patient will participate in aftercare plan  Met:  Yes  Target date: 3-5 days from date of admission   As evidenced by: Patient will participate within aftercare plan AEB aftercare provider and housing plan at discharge being identified.   11/19/15: Pt will return home and follow-up with [facility]; is open to referral at [place] for therapy.  2.  Goal (s): Patient will exhibit decreased depressive symptoms and suicidal ideations.  Met:  Yes  Target date: 3-5 days from date of admission   As evidenced by: Patient will utilize self rating of depression at 3 or below and demonstrate decreased  signs of depression or be deemed stable for discharge by MD.  11/19/15: Pt rates depression at 7/10; denies SI  11/21/15: Pt rate depression at 2/10; denies SI.  3.  Goal(s): Patient will demonstrate decreased signs and symptoms of anxiety.  Met:  Yes  Target date: 3-5 days from date of admission   As evidenced by: Patient will utilize self rating of anxiety at 3 or below and demonstrated decreased signs of anxiety, or be deemed stable for discharge by MD  11/19/15: Pt rates anxiety at 5/10; observed to have anxious affect  11/21/15: Pt rates anxiety at 2/10 Attendees:  Patient:    Family:    Physician: Dr. Parke Poisson, MD  11/21/2015 10:50 AM  Nursing: Lars Pinks, RN Case manager  11/21/2015 10:50 AM  Clinical Social Worker Peri Maris, [place] 11/21/2015 10:50 AM  Other: Maxie Better, LCSWA 11/21/2015 10:50 AM  Clinical: Darrol Angel RN 11/21/2015 10:50 AM  Other: , RN Charge Nurse 11/21/2015 10:50 AM  Other: Hilda Lias, [place], [place] [phone]

## 2015-11-21 NOTE — Progress Notes (Signed)
Discharge note: Pt received both written and verbal discharge instructions. Pt verbalize understanding of discharge instructions. Pt agreed to f/u appt and med regimen. Pt received prescriptions, letter of hosp and belongings. [facility]

## 2015-11-22 ENCOUNTER — Telehealth (HOSPITAL_COMMUNITY): Payer: Self-pay

## 2015-11-22 NOTE — Telephone Encounter (Signed)
Telephone call with patient who reported his insurance changed but he had forgotten to sign something that provides additional coverage for medication.  States without more coverage his Kasandra KnudsenLatuda will cost over $800 and he cannot afford it but will call us back once he knows more and if will need to change medication.

## 2015-11-23 NOTE — Telephone Encounter (Signed)
Pt called stating that his medication is too expensive Kasandra Knudsen(Latuda) and he will need another medication that is similar.  Asking that MD give him a call letting him know what medication he will be prescribed.

## 2015-11-26 ENCOUNTER — Ambulatory Visit (INDEPENDENT_AMBULATORY_CARE_PROVIDER_SITE_OTHER): Payer: Medicare Other | Admitting: Psychiatry

## 2015-11-26 ENCOUNTER — Encounter (HOSPITAL_COMMUNITY): Payer: Self-pay | Admitting: Psychiatry

## 2015-11-26 VITALS — BP 126/88 | HR 110 | Ht 73.0 in | Wt 201.6 lb

## 2015-11-26 DIAGNOSIS — F319 Bipolar disorder, unspecified: Secondary | ICD-10-CM | POA: Diagnosis not present

## 2015-11-26 MED ORDER — TRAZODONE HCL 150 MG PO TABS: 150.0000 mg | ORAL_TABLET | Freq: Every day | ORAL | Status: DC

## 2015-11-26 MED ORDER — SERTRALINE HCL 100 MG PO TABS
100.0000 mg | ORAL_TABLET | Freq: Every day | ORAL | Status: DC
Start: 2015-11-26 — End: 2016-01-29

## 2015-11-26 MED ORDER — LAMOTRIGINE 150 MG PO TABS: 300.0000 mg | ORAL_TABLET | Freq: Every day | ORAL | Status: DC

## 2015-11-26 MED ORDER — LURASIDONE HCL 40 MG PO TABS: 40.0000 mg | ORAL_TABLET | Freq: Every day | ORAL | Status: DC

## 2015-11-26 NOTE — Progress Notes (Signed)
[facility] [phone] Progress Note  [name] [id number] 40 y.o.  11/26/2015 11:14 AM  Chief Complaint:  I am feeling better but I still have poor sleep.  I was terminated from job due to multiple absences.  I still have a lot of stress due to upcoming court dates.  History of Present Illness:  [name] came for his follow-up appointment.  He was recently admitted to [facility] and released on January 11 .  He was admitted because of severe depression and having suicidal thoughts and feeling hopeless and worthless.  His major stressors are legal issues.  He is facing assault charges from his previous girlfriend.  He has 50 B from his previous girlfriend and his ex-wife.  He is very disappointed because he was in the process of getting custody of his child but now it seems to be difficult.  His court dates are coming in few weeks.  Patient has not seen and does not want to be in contact with his previous girlfriend and his ex-wife.  He reported medicine helping him.  He is now taking Zoloft , trazodone and Latuda.  He is concerned about expense because Anette Guarneri is very expensive .  He is trying to get approval from his insurance but it may take some time.  He is terminated from his job because of multiple absence.  He is thinking to move with his mother .  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  He endorse his depression is improved from the past.  He is no longer having suicidal thoughts or homicidal thought.  However he is still complaining of poor sleep .  He has stopped taking his pain medication because he cannot afford .  He is hoping to see a primary care physician but is soon to address his health needs.  He is not drinking and like to reestablish connection with AA .  He denies any paranoia, hallucination, active or passive suicidal parts or homicidal thought.  He denies any manic episodes but  admitted some time or emotional , tearful and having racing thoughts.  His appetite is okay.  His vitals are stable.  He has blood work while he was in the hospital.  His UDS and blood alcohol level was negative.  His hemoglobin A1c is 8.3.  His sodium was 134 and his BUN, creatinine and liver function tests were normal.  During hospitalization his Cymbalta was further reduced and he was continued on trazodone , Lamictal , Latuda and Zoloft.  Patient has no side effects including tremors, shakes or any EPS.  Suicidal Ideation: No Plan Formed: No Patient has means to carry out plan: No  Homicidal Ideation: No Plan Formed: No Patient has means to carry out plan: No  Past Psychiatric History/Hospitalization(s) Patient has multiple psychiatric hospital admission. His last psychiatric hospitalization was January 11th at [facility].  Earlier than that he was admitted in September 2016 at [facility]  he has at least 2 psychiatric hospitalization in  2015. He has a history of heavy drinking, using cocaine and drugs.  In the past he has hospitalization at [facility].  He has history of suicidal thinking and overdose on Klonopin.  He has history of paranoia, anger , substance use , mood swings anger and irritability.  He had tried Depakote, BuSpar, Risperdal, Prozac and Seroquel.  He endorsed increased prolactin level  which could be due to Risperdal .  He has history of using Xanax, Valium and Klonopin.   Anxiety: Yes Bipolar Disorder: Yes Depression: Yes Mania: Yes Psychosis: Yes Schizophrenia: No Personality Disorder: No Hospitalization for psychiatric illness: Yes History of Electroconvulsive Shock Therapy: No Prior Suicide Attempts: Yes  Medical History; Patient has multiple medical problems.  He has hypertension, diabetes mellitus , asthma, GERD, chronic pain, degenerative disc disease, fibromyalgia, low testosterone, hyperlipidemia and allergic rhinitis.  His  primary care physician is Dr. Conley Canal and he see Dr. Dwyane Dee for her diabetes.  Review of Systems  Constitutional: Negative.  Negative for weight loss.  Cardiovascular: Negative for chest pain and palpitations.  Gastrointestinal: Negative.   Musculoskeletal: Positive for joint pain.  Skin: Negative for itching and rash.  Neurological: Negative for tremors.  Psychiatric/Behavioral: Negative for hallucinations. The patient has insomnia.      Psychiatric: Agitation: No Hallucination: No Depressed Mood: No Insomnia: Yes Hypersomnia: No Altered Concentration: No Feels Worthless: No Grandiose Ideas: No Belief In Special Powers: No New/Increased Substance Abuse: No Compulsions: No  Neurologic: Headache: No Seizure: No Paresthesias: No    Outpatient Encounter Prescriptions as of 11/26/2015  Medication Sig  . cetirizine (ZYRTEC) 10 MG tablet Take 1 tablet (10 mg total) by mouth daily. For allergies  . cyclobenzaprine (FLEXERIL) 10 MG tablet Take 1 tablet (10 mg total) by mouth 2 (two) times daily. Muscle spasm  . Fluticasone Furoate-Vilanterol (BREO ELLIPTA) 100-25 MCG/INH AEPB Inhale 1 puff into the lungs daily. For shortness of breath  . gabapentin (NEURONTIN) 800 MG tablet Take 1 tablet (800 mg total) by mouth 3 (three) times daily. For agitation  . hydrOXYzine (ATARAX/VISTARIL) 50 MG tablet Take 1 tablet (50 mg total) by mouth 3 (three) times daily as needed for anxiety.  . insulin aspart (NOVOLOG) 100 UNIT/ML injection Use max 58 units with V-go pump: For diabetes management  . Insulin Disposable Pump (V-GO 20) KIT USE AS DIRECTED: For diabetes management  . insulin glargine (LANTUS) 100 UNIT/ML injection Inject 0.2 mLs (20 Units total) into the skin at bedtime. For diabetes management  . lamoTRIgine (LAMICTAL) 150 MG tablet Take 2 tablets (300 mg total) by mouth daily. For mood stabilization  . lurasidone (LATUDA) 40 MG TABS tablet Take 1 tablet (40 mg total) by mouth daily  with breakfast. For mood control  . mirabegron ER (MYRBETRIQ) 50 MG TB24 tablet Take 1 tablet (50 mg total) by mouth daily. For bladder control  . pioglitazone (ACTOS) 30 MG tablet TAKE 1 TABLET (30 MG TOTAL) BY MOUTH DAILY: For Diabetes managment  . ramipril (ALTACE) 2.5 MG capsule Take 1 capsule (2.5 mg total) by mouth daily. For high blood pressure  . sertraline (ZOLOFT) 100 MG tablet Take 1 tablet (100 mg total) by mouth daily.  . simvastatin (ZOCOR) 10 MG tablet Take 1 tablet (10 mg total) by mouth at bedtime. For high cholesterol  . traZODone (DESYREL) 150 MG tablet Take 1 tablet (150 mg total) by mouth at bedtime.  . [DISCONTINUED] DULoxetine (CYMBALTA) 20 MG capsule Take 1 capsule (20 mg total) by mouth daily. For depression  . [DISCONTINUED] fluticasone (FLONASE) 50 MCG/ACT nasal spray Place 1 spray into both nostrils daily. For allergies  . [DISCONTINUED] ipratropium-albuterol (DUONEB) 0.5-2.5 (3) MG/3ML SOLN Take 3 mLs by nebulization every 4 (four) hours as needed (for wheezing/shortness of breath).  . [DISCONTINUED] lamoTRIgine (LAMICTAL) 150 MG tablet Take 2 tablets (300 mg total) by mouth daily. For mood stabilization  . [DISCONTINUED]  lidocaine (LIDODERM) 5 % Place 1 patch onto the skin daily. Remove & Discard patch within 12 hours or as directed by MD: For pain management  . [DISCONTINUED] lurasidone (LATUDA) 40 MG TABS tablet Take 1 tablet (40 mg total) by mouth 2 (two) times daily. For mood control  . [DISCONTINUED] lurasidone (LATUDA) 40 MG TABS tablet Take 1 tablet (40 mg total) by mouth daily with breakfast. For mood control  . [DISCONTINUED] nicotine (NICODERM CQ - DOSED IN MG/24 HOURS) 21 mg/24hr patch Place 1 patch (21 mg total) onto the skin daily. For smoking cessation  . [DISCONTINUED] sertraline (ZOLOFT) 50 MG tablet Take 1 tablet (50 mg total) by mouth daily. For depression  . [DISCONTINUED] traZODone (DESYREL) 100 MG tablet Take 1 tablet (100 mg total) by mouth at  bedtime as needed for sleep.   No facility-administered encounter medications on file as of 11/26/2015.    Recent Results (from the past 2160 hour(s))  POCT HgB A1C     Status: Abnormal   Collection Time: 11/08/15 11:06 AM  Result Value Ref Range   Hemoglobin A1C 8.2   Comp Met (CMET)     Status: Abnormal   Collection Time: 11/08/15 11:25 AM  Result Value Ref Range   Sodium 136 135 - 145 mEq/L   Potassium 4.4 3.5 - 5.1 mEq/L   Chloride 102 96 - 112 mEq/L   CO2 26 19 - 32 mEq/L   Glucose, Bld 112 (H) 70 - 99 mg/dL   BUN 8 6 - 23 mg/dL   Creatinine, Ser 0.88 0.40 - 1.50 mg/dL   Total Bilirubin 0.4 0.2 - 1.2 mg/dL   Alkaline Phosphatase 85 39 - 117 U/L   AST 23 0 - 37 U/L   ALT 20 0 - 53 U/L   Total Protein 7.2 6.0 - 8.3 g/dL   Albumin 4.4 3.5 - 5.2 g/dL   Calcium 9.7 8.4 - 10.5 mg/dL   GFR 101.43 >60.00 mL/min  Lipid panel     Status: Abnormal   Collection Time: 11/08/15 11:25 AM  Result Value Ref Range   Cholesterol 167 0 - 200 mg/dL    Comment: ATP III Classification       Desirable:  < 200 mg/dL               Borderline High:  200 - 239 mg/dL          High:  > = 240 mg/dL   Triglycerides 44.0 0.0 - 149.0 mg/dL    Comment: Normal:  <150 mg/dLBorderline High:  150 - 199 mg/dL   HDL 49.10 >39.00 mg/dL   VLDL 8.8 0.0 - 40.0 mg/dL   LDL Cholesterol 109 (H) 0 - 99 mg/dL   Total CHOL/HDL Ratio 3     Comment:                Men          Women1/2 Average Risk     3.4          3.3Average Risk          5.0          4.42X Average Risk          9.6          7.13X Average Risk          15.0          11.0  NonHDL 117.74     Comment: NOTE:  Non-HDL goal should be 30 mg/dL higher than patient's LDL goal (i.e. LDL goal of < 70 mg/dL, would have non-HDL goal of < 100 mg/dL)  Microalbumin / creatinine urine ratio     Status: None   Collection Time: 11/08/15 11:25 AM  Result Value Ref Range   Microalb, Ur <0.7 0.0 - 1.9 mg/dL   Creatinine,U 37.6 mg/dL   Microalb Creat  Ratio 1.9 0.0 - 30.0 mg/g  POC CBG, ED     Status: Abnormal   Collection Time: 11/15/15  4:54 PM  Result Value Ref Range   Glucose-Capillary 154 (H) 65 - 99 mg/dL  Urine rapid drug screen (hosp performed)     Status: None   Collection Time: 11/15/15  5:07 PM  Result Value Ref Range   Opiates NONE DETECTED NONE DETECTED   Cocaine NONE DETECTED NONE DETECTED   Benzodiazepines NONE DETECTED NONE DETECTED   Amphetamines NONE DETECTED NONE DETECTED   Tetrahydrocannabinol NONE DETECTED NONE DETECTED   Barbiturates NONE DETECTED NONE DETECTED    Comment:        DRUG SCREEN FOR MEDICAL PURPOSES ONLY.  IF CONFIRMATION IS NEEDED FOR ANY PURPOSE, NOTIFY LAB WITHIN 5 DAYS.        LOWEST DETECTABLE LIMITS FOR URINE DRUG SCREEN Drug Class       Cutoff (ng/mL) Amphetamine      1000 Barbiturate      200 Benzodiazepine   540 Tricyclics       981 Opiates          300 Cocaine          300 THC              50   Urinalysis, Routine w reflex microscopic (not at [facility])     Status: None   Collection Time: 11/15/15  5:07 PM  Result Value Ref Range   Color, Urine YELLOW YELLOW   APPearance CLEAR CLEAR   Specific Gravity, Urine 1.006 1.005 - 1.030   pH 6.5 5.0 - 8.0   Glucose, UA NEGATIVE NEGATIVE mg/dL   Hgb urine dipstick NEGATIVE NEGATIVE   Bilirubin Urine NEGATIVE NEGATIVE   Ketones, ur NEGATIVE NEGATIVE mg/dL   Protein, ur NEGATIVE NEGATIVE mg/dL   Nitrite NEGATIVE NEGATIVE   Leukocytes, UA NEGATIVE NEGATIVE    Comment: MICROSCOPIC NOT DONE ON URINES WITH NEGATIVE PROTEIN, BLOOD, LEUKOCYTES, NITRITE, OR GLUCOSE <1000 mg/dL.  Ethanol     Status: None   Collection Time: 11/15/15  5:41 PM  Result Value Ref Range   Alcohol, Ethyl (B) <5 <5 mg/dL    Comment:        LOWEST DETECTABLE LIMIT FOR SERUM ALCOHOL IS 5 mg/dL FOR MEDICAL PURPOSES ONLY   CBC with Differential/Platelet     Status: None   Collection Time: 11/15/15  5:41 PM  Result Value Ref Range   WBC 9.6 4.0 - 10.5 K/uL   RBC  4.74 4.22 - 5.81 MIL/uL   Hemoglobin 14.2 13.0 - 17.0 g/dL   HCT 43.4 39.0 - 52.0 %   MCV 91.6 78.0 - 100.0 fL   MCH 30.0 26.0 - 34.0 pg   MCHC 32.7 30.0 - 36.0 g/dL   RDW 14.6 11.5 - 15.5 %   Platelets 282 150 - 400 K/uL   Neutrophils Relative % 75 %   Neutro Abs 7.3 1.7 - 7.7 K/uL   Lymphocytes Relative 15 %   Lymphs Abs 1.4 0.7 -  4.0 K/uL   Monocytes Relative 9 %   Monocytes Absolute 0.8 0.1 - 1.0 K/uL   Eosinophils Relative 1 %   Eosinophils Absolute 0.1 0.0 - 0.7 K/uL   Basophils Relative 0 %   Basophils Absolute 0.0 0.0 - 0.1 K/uL  Comprehensive metabolic panel     Status: Abnormal   Collection Time: 11/15/15  5:41 PM  Result Value Ref Range   Sodium 134 (L) 135 - 145 mmol/L   Potassium 4.3 3.5 - 5.1 mmol/L   Chloride 96 (L) 101 - 111 mmol/L   CO2 27 22 - 32 mmol/L   Glucose, Bld 164 (H) 65 - 99 mg/dL   BUN 8 6 - 20 mg/dL   Creatinine, Ser 0.86 0.61 - 1.24 mg/dL   Calcium 9.5 8.9 - 10.3 mg/dL   Total Protein 7.4 6.5 - 8.1 g/dL   Albumin 4.6 3.5 - 5.0 g/dL   AST 20 15 - 41 U/L   ALT 20 17 - 63 U/L   Alkaline Phosphatase 90 38 - 126 U/L   Total Bilirubin 0.8 0.3 - 1.2 mg/dL   GFR calc non Af Amer >60 >60 mL/min   GFR calc Af Amer >60 >60 mL/min    Comment: (NOTE) The eGFR has been calculated using the CKD EPI equation. This calculation has not been validated in all clinical situations. eGFR's persistently <60 mL/min signify possible Chronic Kidney Disease.    Anion gap 11 5 - 15  Salicylate level     Status: None   Collection Time: 11/15/15  5:41 PM  Result Value Ref Range   Salicylate Lvl <5.7 2.8 - 30.0 mg/dL  Acetaminophen level     Status: Abnormal   Collection Time: 11/15/15  5:41 PM  Result Value Ref Range   Acetaminophen (Tylenol), Serum <10 (L) 10 - 30 ug/mL    Comment:        THERAPEUTIC CONCENTRATIONS VARY SIGNIFICANTLY. A RANGE OF 10-30 ug/mL MAY BE AN EFFECTIVE CONCENTRATION FOR MANY PATIENTS. HOWEVER, SOME ARE BEST TREATED AT CONCENTRATIONS  OUTSIDE THIS RANGE. ACETAMINOPHEN CONCENTRATIONS >150 ug/mL AT 4 HOURS AFTER INGESTION AND >50 ug/mL AT 12 HOURS AFTER INGESTION ARE OFTEN ASSOCIATED WITH TOXIC REACTIONS.   Glucose, capillary     Status: Abnormal   Collection Time: 11/15/15  9:35 PM  Result Value Ref Range   Glucose-Capillary 215 (H) 65 - 99 mg/dL  Glucose, capillary     Status: Abnormal   Collection Time: 11/16/15  6:19 AM  Result Value Ref Range   Glucose-Capillary 239 (H) 65 - 99 mg/dL  Glucose, capillary     Status: Abnormal   Collection Time: 11/16/15 11:36 AM  Result Value Ref Range   Glucose-Capillary 259 (H) 65 - 99 mg/dL  Glucose, capillary     Status: Abnormal   Collection Time: 11/16/15  4:38 PM  Result Value Ref Range   Glucose-Capillary 281 (H) 65 - 99 mg/dL  Glucose, capillary     Status: Abnormal   Collection Time: 11/16/15  8:53 PM  Result Value Ref Range   Glucose-Capillary 185 (H) 65 - 99 mg/dL  Glucose, capillary     Status: Abnormal   Collection Time: 11/17/15  6:26 AM  Result Value Ref Range   Glucose-Capillary 189 (H) 65 - 99 mg/dL  Glucose, capillary     Status: Abnormal   Collection Time: 11/17/15 11:56 AM  Result Value Ref Range   Glucose-Capillary 325 (H) 65 - 99 mg/dL   Comment 1 Notify RN  Comment 2 Document in Chart   Glucose, capillary     Status: Abnormal   Collection Time: 11/17/15  5:18 PM  Result Value Ref Range   Glucose-Capillary 251 (H) 65 - 99 mg/dL   Comment 1 Notify RN    Comment 2 Document in Chart   Glucose, capillary     Status: Abnormal   Collection Time: 11/17/15  9:00 PM  Result Value Ref Range   Glucose-Capillary 309 (H) 65 - 99 mg/dL   Comment 1 Notify RN    Comment 2 Document in Chart   Glucose, capillary     Status: Abnormal   Collection Time: 11/18/15  6:17 AM  Result Value Ref Range   Glucose-Capillary 284 (H) 65 - 99 mg/dL   Comment 1 Notify RN    Comment 2 Document in Chart   Hemoglobin A1c     Status: Abnormal   Collection Time:  11/18/15  6:29 AM  Result Value Ref Range   Hgb A1c MFr Bld 8.3 (H) 4.8 - 5.6 %    Comment: (NOTE)         Pre-diabetes: 5.7 - 6.4         Diabetes: >6.4         Glycemic control for adults with diabetes: <7.0    Mean Plasma Glucose 192 mg/dL    Comment: (NOTE) Performed At: [facility] [place], [place] [id number] Lindon Romp MD [id number] Performed at [facility]   Lipid panel     Status: None   Collection Time: 11/18/15  6:29 AM  Result Value Ref Range   Cholesterol 151 0 - 200 mg/dL   Triglycerides 93 <150 mg/dL   HDL 47 >40 mg/dL   Total CHOL/HDL Ratio 3.2 RATIO   VLDL 19 0 - 40 mg/dL   LDL Cholesterol 85 0 - 99 mg/dL    Comment:        Total Cholesterol/HDL:CHD Risk Coronary Heart Disease Risk Table                     Men   Women  1/2 Average Risk   3.4   3.3  Average Risk       5.0   4.4  2 X Average Risk   9.6   7.1  3 X Average Risk  23.4   11.0        Use the calculated Patient Ratio above and the CHD Risk Table to determine the patient's CHD Risk.        ATP III CLASSIFICATION (LDL):  <100     mg/dL   Optimal  100-129  mg/dL   Near or Above                    Optimal  130-159  mg/dL   Borderline  160-189  mg/dL   High  >190     mg/dL   Very High Performed at [facility]   Glucose, capillary     Status: Abnormal   Collection Time: 11/18/15 11:54 AM  Result Value Ref Range   Glucose-Capillary 332 (H) 65 - 99 mg/dL   Comment 1 Notify RN    Comment 2 Document in Chart   Glucose, capillary     Status: Abnormal   Collection Time: 11/18/15  5:00 PM  Result Value Ref Range   Glucose-Capillary 158 (H) 65 - 99 mg/dL   Comment 1 Notify RN  Comment 2 Document in Chart   Glucose, capillary     Status: Abnormal   Collection Time: 11/18/15  8:36 PM  Result Value Ref Range   Glucose-Capillary 286 (H) 65 - 99 mg/dL  Glucose, capillary     Status: Abnormal   Collection Time: 11/19/15  5:47 AM  Result  Value Ref Range   Glucose-Capillary 290 (H) 65 - 99 mg/dL   Comment 1 Notify RN   Glucose, capillary     Status: Abnormal   Collection Time: 11/19/15 11:34 AM  Result Value Ref Range   Glucose-Capillary 352 (H) 65 - 99 mg/dL  Glucose, capillary     Status: Abnormal   Collection Time: 11/19/15  4:22 PM  Result Value Ref Range   Glucose-Capillary 425 (H) 65 - 99 mg/dL   Comment 1 Notify RN   Glucose, capillary     Status: Abnormal   Collection Time: 11/19/15  5:29 PM  Result Value Ref Range   Glucose-Capillary 395 (H) 65 - 99 mg/dL  Glucose, capillary     Status: Abnormal   Collection Time: 11/19/15  8:40 PM  Result Value Ref Range   Glucose-Capillary 176 (H) 65 - 99 mg/dL  Glucose, capillary     Status: Abnormal   Collection Time: 11/20/15  6:01 AM  Result Value Ref Range   Glucose-Capillary 287 (H) 65 - 99 mg/dL  Glucose, capillary     Status: Abnormal   Collection Time: 11/20/15  9:54 AM  Result Value Ref Range   Glucose-Capillary 426 (H) 65 - 99 mg/dL   Comment 1 Notify RN    Comment 2 Document in Chart   Glucose, capillary     Status: Abnormal   Collection Time: 11/20/15 11:55 AM  Result Value Ref Range   Glucose-Capillary 259 (H) 65 - 99 mg/dL  Glucose, capillary     Status: Abnormal   Collection Time: 11/20/15  5:09 PM  Result Value Ref Range   Glucose-Capillary 315 (H) 65 - 99 mg/dL  Glucose, capillary     Status: Abnormal   Collection Time: 11/20/15  9:28 PM  Result Value Ref Range   Glucose-Capillary 410 (H) 65 - 99 mg/dL  Glucose, capillary     Status: Abnormal   Collection Time: 11/21/15  6:06 AM  Result Value Ref Range   Glucose-Capillary 326 (H) 65 - 99 mg/dL  Glucose, capillary     Status: Abnormal   Collection Time: 11/21/15 11:41 AM  Result Value Ref Range   Glucose-Capillary 264 (H) 65 - 99 mg/dL      Physical Exam: Constitutional:  BP 126/88 mmHg  Pulse 110  Ht '6\' 1"'  (1.854 m)  Wt 201 lb 9.6 oz (91.445 kg)  BMI 26.60  kg/m2  Musculoskeletal: Strength & Muscle Tone: within normal limits Gait & Station: normal Patient leans: N/A  Mental Status Examination;  Patient is casually dressed and groomed.  He is cooperative and maintained good eye contact.  He described his mood anxious and his affect is constricted.  His his speech is clear, slow and coherent.  He denies any auditory or visual hallucination.  He denies any active or passive suicidal thoughts or homicidal thought.  His psychomotor activity is normal.  His attention and concentration is fair.  There were no delusions, paranoia or any obsessive thoughts.  His thought process logical and goal-directed. His cognition is okay.  He has no tremors or shakes.  His insight judgment and impulse control is fair.  Established Problem, Stable/Improving (  1), Review of Psycho-Social Stressors (1), Review or order clinical lab tests (1), Review and summation of old records (2), Review of Last Therapy Session (1), Review of Medication Regimen & Side Effects (2) and Review of New Medication or Change in Dosage (2)  Assessment: Axis I: Bipolar disorder NOS, rule out posttraumatic stress disorder  Axis II: Deferred  Axis III:  Past Medical History  Diagnosis Date  . Allergy   . Depression   . GERD (gastroesophageal reflux disease)   . Hyperlipidemia   . Chronic pain disorder     Sees [facility]  . Urinary incontinence     detrusor instability  . Hypogonadism   . Diabetes mellitus     Type II  . Chronic neck pain   . Bipolar depression ([place])   . Opiate dependence, continuous ([place])   . Polysubstance abuse   . Drug-seeking behavior    Plan:  I review records from inpatient services including discharge summary, blood work results and current medication.  His hemoglobin A1c is 8.3.  Discontinue Cymbalta as patient is no longer taking it.  Increase Zoloft 100 mg daily and trazodone one 50 mg at bedtime to help his sleep.  Continue lithium at 40 mg  daily.  Patient is in the process of getting approval from his insurance company however we discuss if it did not approve then he may need to try Geodon.  In the meantime we will provide samples of Latuda.  I also strongly encouraged him to see primary care physician for his chronic pain as patient is no longer taking lidocaine patches, Flexeril.  At this time patient is more focused on his physical and mental health.  He is not interested to find a job and liked to move in with his mother.  Discuss psychosocial and legal issues.  Due to financial reasons patient is not interested in counseling at this time.  Discussed medication side effects and benefits.  Continue Lamictal 300 mg daily.  Patient has no tremors, shakes, rash, itching or any EPS.  I will see him again in 6-  8 weeks.Time spent 25 minutes.  More than 50% of the time spent in psychoeducation, counseling and coordination of care.  Discuss safety plan that anytime having active suicidal thoughts or homicidal thoughts then patient need to call 911 or go to the local emergency room.   Zemira Zehring T., MD 11/26/2015

## 2015-11-27 ENCOUNTER — Other Ambulatory Visit (HOSPITAL_COMMUNITY): Payer: Self-pay | Admitting: Neurological Surgery

## 2015-11-27 DIAGNOSIS — G8929 Other chronic pain: Secondary | ICD-10-CM | POA: Diagnosis not present

## 2015-11-27 DIAGNOSIS — M542 Cervicalgia: Secondary | ICD-10-CM | POA: Diagnosis not present

## 2015-12-06 ENCOUNTER — Telehealth (HOSPITAL_COMMUNITY): Payer: Self-pay

## 2015-12-06 DIAGNOSIS — M5412 Radiculopathy, cervical region: Secondary | ICD-10-CM | POA: Diagnosis not present

## 2015-12-06 DIAGNOSIS — M4802 Spinal stenosis, cervical region: Secondary | ICD-10-CM | POA: Diagnosis not present

## 2015-12-06 DIAGNOSIS — M542 Cervicalgia: Secondary | ICD-10-CM | POA: Diagnosis not present

## 2015-12-06 DIAGNOSIS — M791 Myalgia: Secondary | ICD-10-CM | POA: Diagnosis not present

## 2015-12-06 NOTE — Telephone Encounter (Signed)
Patient called and he states that he would like a change in his anxiety medication. He says the current medication

## 2015-12-07 ENCOUNTER — Encounter (HOSPITAL_COMMUNITY): Payer: Self-pay

## 2015-12-07 ENCOUNTER — Encounter (HOSPITAL_COMMUNITY)
Admission: RE | Admit: 2015-12-07 | Discharge: 2015-12-07 | Disposition: A | Discharge status: Home / Self Care | Payer: PPO | Source: Ambulatory Visit | Attending: Neurological Surgery | Admitting: Neurological Surgery

## 2015-12-07 ENCOUNTER — Ambulatory Visit (HOSPITAL_COMMUNITY): Admission: RE | Admit: 2015-12-07 | Payer: PPO | Source: Ambulatory Visit

## 2015-12-07 DIAGNOSIS — F17200 Nicotine dependence, unspecified, uncomplicated: Secondary | ICD-10-CM | POA: Diagnosis not present

## 2015-12-07 DIAGNOSIS — E119 Type 2 diabetes mellitus without complications: Secondary | ICD-10-CM | POA: Insufficient documentation

## 2015-12-07 DIAGNOSIS — E785 Hyperlipidemia, unspecified: Secondary | ICD-10-CM | POA: Insufficient documentation

## 2015-12-07 DIAGNOSIS — Z9641 Presence of insulin pump (external) (internal): Secondary | ICD-10-CM | POA: Insufficient documentation

## 2015-12-07 DIAGNOSIS — M4802 Spinal stenosis, cervical region: Secondary | ICD-10-CM

## 2015-12-07 DIAGNOSIS — Z01818 Encounter for other preprocedural examination: Secondary | ICD-10-CM | POA: Insufficient documentation

## 2015-12-07 DIAGNOSIS — I10 Essential (primary) hypertension: Secondary | ICD-10-CM | POA: Insufficient documentation

## 2015-12-07 DIAGNOSIS — Z79899 Other long term (current) drug therapy: Secondary | ICD-10-CM | POA: Insufficient documentation

## 2015-12-07 DIAGNOSIS — Z01812 Encounter for preprocedural laboratory examination: Secondary | ICD-10-CM | POA: Insufficient documentation

## 2015-12-07 DIAGNOSIS — M5030 Other cervical disc degeneration, unspecified cervical region: Secondary | ICD-10-CM | POA: Diagnosis not present

## 2015-12-07 DIAGNOSIS — K219 Gastro-esophageal reflux disease without esophagitis: Secondary | ICD-10-CM | POA: Insufficient documentation

## 2015-12-07 HISTORY — DX: Pneumonia, unspecified organism: J18.9

## 2015-12-07 HISTORY — DX: Anxiety disorder, unspecified: F41.9

## 2015-12-07 HISTORY — DX: Unspecified osteoarthritis, unspecified site: M19.90

## 2015-12-07 LAB — PROTIME-INR
INR: 0.99 (ref 0.00–1.49)
PROTHROMBIN TIME: 13.3 s (ref 11.6–15.2)

## 2015-12-07 LAB — BASIC METABOLIC PANEL
ANION GAP: 9 (ref 5–15)
BUN: 7 mg/dL (ref 6–20)
CHLORIDE: 96 mmol/L — AB (ref 101–111)
CO2: 29 mmol/L (ref 22–32)
Calcium: 9.7 mg/dL (ref 8.9–10.3)
Creatinine, Ser: 0.87 mg/dL (ref 0.61–1.24)
GFR calc Af Amer: >60 mL/min (ref >60)
Glucose, Bld: 198 mg/dL — ABNORMAL HIGH (ref 65–99)
POTASSIUM: 4.4 mmol/L (ref 3.5–5.1)
SODIUM: 134 mmol/L — AB (ref 135–145)

## 2015-12-07 LAB — SURGICAL PCR SCREEN
MRSA, PCR: NEGATIVE
STAPHYLOCOCCUS AUREUS: NEGATIVE

## 2015-12-07 LAB — GLUCOSE, CAPILLARY: Glucose-Capillary: 258 mg/dL — ABNORMAL HIGH (ref 65–99)

## 2015-12-07 NOTE — Progress Notes (Addendum)
Call to Diabetes coordinator, [name],  instructions for this pt. Reviewed, referencing a [place] pump.

## 2015-12-07 NOTE — Progress Notes (Signed)
Pt. Followed by Dr. Loreta Ave in [place] for med. Management & PCP, Dr Lucianne Muss manages the diabetes treatment. Pt. Reports that he had a cardiac cath. 10 + yrs. Ago, told that it was done due his chest pain & that it was wnl.  Pt. Denies ever having additional test on his heart.  Pt. Denies chest complaints. Today.

## 2015-12-07 NOTE — Pre-Procedure Instructions (Signed)
Timothy Hay Jr.  12/07/2015      [facility] - [place], [place] - [address] [place] [address] Phone: [phone] Fax: [phone]  [facility] - [place] - [address] [facility] [address] [place] [address] Phone: [phone] Fax: [phone]  [id number] - [place], [place] - [address] [facility] [place] [address] Phone: [phone] Fax: [phone]  [facility] [address], [place] - [address] [place] [address] Phone: [phone] Fax: [phone]    Your procedure is scheduled on   12/21/2015  Report to [facility] Admitting at 8:30 A.M.  Call this number if you have problems the morning of surgery:  [phone]   Remember:  Do not eat food or drink liquids after midnight.  On Thursday 12/20/2015   Take these medicines the morning of surgery with A SIP OF WATER :Zyrtec, Gabapentin, Lamictal, Latuda, Myrbetriq, Pain Medicine, Zoloft, Tizanidine, Vistaril    Do not wear jewelry  Do not wear lotions, powders, or perfumes.               Men may shave face and neck.  Do not bring valuables to the hospital.  [facility] is not responsible for any belongings or valuables.  Contacts, dentures or bridgework may not be worn into surgery.  Leave your suitcase in the car.  After surgery it may be brought to your room.  For patients admitted to the hospital, discharge time will be determined by your treatment team.  Patients discharged the day of surgery will not be allowed to drive home.   Name and phone number of your driver:   With Mother    Special instructions:Special Instructions:  - Preparing for Surgery  Before surgery, you can play an important role.  Because skin is not sterile, your skin needs to be as free of germs as possible.  You can reduce the number of germs on you skin by washing with CHG  (chlorahexidine gluconate) soap before surgery.  CHG is an antiseptic cleaner which kills germs and bonds with the skin to continue killing germs even after washing.  Please DO NOT use if you have an allergy to CHG or antibacterial soaps.  If your skin becomes reddened/irritated stop using the CHG and inform your nurse when you arrive at Short Stay.  Do not shave (including legs and underarms) for at least 48 hours prior to the first CHG shower.  You may shave your face.  Please follow these instructions carefully:   1.  Shower with CHG Soap the night before surgery and the  morning of Surgery.  2.  If you choose to wash your hair, wash your hair first as usual with your  normal shampoo.  3.  After you shampoo, rinse your hair and body thoroughly to remove the  Shampoo.  4.  Use CHG as you would any other liquid soap.  You can apply chg directly to the skin and wash gently with scrungie or a clean washcloth.  5.  Apply the CHG Soap to your body ONLY FROM THE NECK DOWN.    Do not use on open wounds or open sores.  Avoid contact with your eyes, ears, mouth and genitals (private parts).  Wash genitals (private parts)   with your normal soap.  6.  Wash thoroughly, paying special attention to  the area where your surgery will be performed.  7.  Thoroughly rinse your body with warm water from the neck down.  8.  DO NOT shower/wash with your normal soap after using and rinsing off   the CHG Soap.  9.  Pat yourself dry with a clean towel.            10.  Wear clean pajamas.            11.  Place clean sheets on your bed the night of your first shower and do not sleep with pets.  Day of Surgery  Do not apply any lotions/deodorants the morning of surgery.  Please wear clean clothes to the hospital/surgery center.      How to Manage Your Diabetes Before Surgery   Why is it important to control my blood sugar before and after surgery?   Improving blood sugar levels before and after surgery helps  healing and can limit problems.  A way of improving blood sugar control is eating a healthy diet by:  - Eating less sugar and carbohydrates  - Increasing activity/exercise  - Talk with your doctor about reaching your blood sugar goals  High blood sugars (greater than 180 mg/dL) can raise your risk of infections and slow down your recovery so you will need to focus on controlling your diabetes during the weeks before surgery.  Make sure that the doctor who takes care of your diabetes knows about your planned surgery including the date and location.  How do I manage my blood sugars before surgery?   Check your blood sugar at least 4 times a day, 2 days before surgery to make sure that they are not too high or low.   Check your blood sugar the morning of your surgery when you wake up and every 2               hours until you get to the Short-Stay unit.  If your blood sugar is less than 70 mg/dL, you will need to treat for low blood sugar by:  Treat a low blood sugar (less than 70 mg/dL) with 1/2 cup of clear juice (cranberry or apple), 4 glucose tablets, OR glucose gel.  Recheck blood sugar in 15 minutes after treatment (to make sure it is greater than 70 mg/dL).  If blood sugar is not greater than 70 mg/dL on re-check, call [phone] for further instructions.   Report your blood sugar to the Short-Stay nurse when you get to Short-Stay.  References:  [facility], 2007 "How to Manage your Diabetes Before and After Surgery".  What do I do about my diabetes medications?   Do not take oral diabetes medicines (pills) the morning of surgery.    THE NIGHT BEFORE SURGERY,  CONTINUE THE USE OF THE VGO PUMP- at the basal rate rate only- NO BOLUSING AFTER MIDNIGHT         If your CBG is greater than 220 mg/dL, you may take 1/2 of your sliding scale (correction) dose of insulin.   For patients with "Insulin Pumps":  Contact your diabetes doctor for  specific instructions before surgery.     Note that if your surgery is planned to be longer than 2 hours, your insulin pump will be removed and intravenous (IV) insulin will be started and managed by the nurses and anesthesiologist.  You will be able to restart your insulin pump once you are awake and able to manage it.  Make  sure to bring insulin pump supplies to the hospital with you in case your site needs to be changed.        Please read over the following fact sheets that you were given. Pain Booklet, Coughing and Deep Breathing, MRSA Information and Surgical Site Infection Prevention

## 2015-12-11 DIAGNOSIS — M79609 Pain in unspecified limb: Secondary | ICD-10-CM | POA: Diagnosis not present

## 2015-12-11 DIAGNOSIS — M545 Low back pain: Secondary | ICD-10-CM | POA: Diagnosis not present

## 2015-12-11 DIAGNOSIS — Z79899 Other long term (current) drug therapy: Secondary | ICD-10-CM | POA: Diagnosis not present

## 2015-12-11 DIAGNOSIS — M542 Cervicalgia: Secondary | ICD-10-CM | POA: Diagnosis not present

## 2015-12-11 NOTE — Progress Notes (Signed)
Anesthesia Chart Review:  Pt is a 40 year old male scheduled for C4-5, C5-6 ACDF on 12/21/2015 with Dr. Yetta Barre.   Endocrinologist is Dr. Reather Littler.   PMH includes:  DM (on insulin pump), HTN, hx polysubstance abuse, hyperlipidemia, bipolar disease, GERD. Current smoker. BMI 27.   Medications include: humalog, pioglitazone, ramipril, simvastatin.   Preoperative labs reviewed.  Glucose 198. HgbA1c was 8.3 on 11/18/15.   Chest x-ray 10/31/15 reviewed. Normal chest radiograph  EKG 11/17/15: NSR.  If no changes, I anticipate pt can proceed with surgery as scheduled.   Rica Mast, FNP-BC [facility]/Anesthesiology Phone: [phone] 12/11/2015 2:03 PM

## 2015-12-14 ENCOUNTER — Telehealth (HOSPITAL_COMMUNITY): Payer: Self-pay

## 2015-12-14 ENCOUNTER — Telehealth: Payer: Self-pay | Admitting: Nutrition

## 2015-12-14 DIAGNOSIS — F3162 Bipolar disorder, current episode mixed, moderate: Secondary | ICD-10-CM

## 2015-12-14 MED ORDER — HYDROXYZINE HCL 50 MG PO TABS: 50.0000 mg | ORAL_TABLET | Freq: Every day | ORAL | Status: DC

## 2015-12-14 NOTE — Telephone Encounter (Signed)
Patient need some samples for his vgo pump

## 2015-12-14 NOTE — Telephone Encounter (Signed)
Refilled and called the patient to let him know

## 2015-12-14 NOTE — Telephone Encounter (Signed)
Medication refill request - Fax recevied from [facility] in [place] for patient's Hydroxyzine 50 mg, 2 at bedtime.  Called and spoke Sandi Mealy, pharmacist there who reports they did not receive an order 11/21/15 from NP when patient was inpatient.  Patient returns to see Dr. Lolly Mustache on 01/24/16.

## 2015-12-14 NOTE — Telephone Encounter (Signed)
Okay to refill hydroxyzine 50 mg 2 at bedtime #60.

## 2015-12-20 MED ORDER — CEFAZOLIN SODIUM-DEXTROSE 2-3 GM-% IV SOLR
2.0000 g | INTRAVENOUS | Status: AC
  Administered 2015-12-21: 2 g via INTRAVENOUS
  Filled 2015-12-20: qty 50

## 2015-12-21 ENCOUNTER — Inpatient Hospital Stay (HOSPITAL_COMMUNITY): Admission: RE | Admit: 2015-12-21 | Payer: PPO | Source: Ambulatory Visit | Admitting: Neurological Surgery

## 2015-12-21 ENCOUNTER — Inpatient Hospital Stay (HOSPITAL_COMMUNITY): Payer: PPO | Admitting: Anesthesiology

## 2015-12-21 ENCOUNTER — Encounter (HOSPITAL_COMMUNITY): Payer: Self-pay | Admitting: *Deleted

## 2015-12-21 ENCOUNTER — Ambulatory Visit (HOSPITAL_COMMUNITY): Admission: RE | Payer: Self-pay | Source: Ambulatory Visit

## 2015-12-21 ENCOUNTER — Observation Stay (HOSPITAL_COMMUNITY): Payer: PPO

## 2015-12-21 ENCOUNTER — Inpatient Hospital Stay (HOSPITAL_COMMUNITY): Payer: PPO | Admitting: Emergency Medicine

## 2015-12-21 ENCOUNTER — Encounter (HOSPITAL_COMMUNITY)
Admission: RE | Disposition: A | Discharge status: Home / Self Care | Payer: Self-pay | Source: Ambulatory Visit | Attending: Neurological Surgery

## 2015-12-21 ENCOUNTER — Ambulatory Visit (HOSPITAL_COMMUNITY)
Admission: RE | Admit: 2015-12-21 | Discharge: 2015-12-22 | Disposition: A | Discharge status: Home / Self Care | Payer: PPO | Source: Ambulatory Visit | Attending: Neurological Surgery | Admitting: Neurological Surgery

## 2015-12-21 DIAGNOSIS — F1120 Opioid dependence, uncomplicated: Secondary | ICD-10-CM | POA: Insufficient documentation

## 2015-12-21 DIAGNOSIS — Z79899 Other long term (current) drug therapy: Secondary | ICD-10-CM | POA: Diagnosis not present

## 2015-12-21 DIAGNOSIS — J449 Chronic obstructive pulmonary disease, unspecified: Secondary | ICD-10-CM | POA: Insufficient documentation

## 2015-12-21 DIAGNOSIS — Z7951 Long term (current) use of inhaled steroids: Secondary | ICD-10-CM | POA: Insufficient documentation

## 2015-12-21 DIAGNOSIS — Z419 Encounter for procedure for purposes other than remedying health state, unspecified: Secondary | ICD-10-CM

## 2015-12-21 DIAGNOSIS — E785 Hyperlipidemia, unspecified: Secondary | ICD-10-CM | POA: Insufficient documentation

## 2015-12-21 DIAGNOSIS — M50221 Other cervical disc displacement at C4-C5 level: Secondary | ICD-10-CM | POA: Diagnosis not present

## 2015-12-21 DIAGNOSIS — E119 Type 2 diabetes mellitus without complications: Secondary | ICD-10-CM | POA: Diagnosis not present

## 2015-12-21 DIAGNOSIS — Z981 Arthrodesis status: Secondary | ICD-10-CM

## 2015-12-21 DIAGNOSIS — M4802 Spinal stenosis, cervical region: Secondary | ICD-10-CM | POA: Insufficient documentation

## 2015-12-21 DIAGNOSIS — Z794 Long term (current) use of insulin: Secondary | ICD-10-CM | POA: Diagnosis not present

## 2015-12-21 DIAGNOSIS — F419 Anxiety disorder, unspecified: Secondary | ICD-10-CM | POA: Insufficient documentation

## 2015-12-21 DIAGNOSIS — I10 Essential (primary) hypertension: Secondary | ICD-10-CM | POA: Insufficient documentation

## 2015-12-21 DIAGNOSIS — M47812 Spondylosis without myelopathy or radiculopathy, cervical region: Principal | ICD-10-CM | POA: Insufficient documentation

## 2015-12-21 DIAGNOSIS — F319 Bipolar disorder, unspecified: Secondary | ICD-10-CM | POA: Diagnosis not present

## 2015-12-21 DIAGNOSIS — Z9641 Presence of insulin pump (external) (internal): Secondary | ICD-10-CM | POA: Insufficient documentation

## 2015-12-21 DIAGNOSIS — F17210 Nicotine dependence, cigarettes, uncomplicated: Secondary | ICD-10-CM | POA: Insufficient documentation

## 2015-12-21 DIAGNOSIS — K219 Gastro-esophageal reflux disease without esophagitis: Secondary | ICD-10-CM | POA: Diagnosis not present

## 2015-12-21 HISTORY — PX: ANTERIOR CERVICAL DECOMP/DISCECTOMY FUSION: SHX1161

## 2015-12-21 LAB — CBC
HEMATOCRIT: 45 % (ref 39.0–52.0)
Hemoglobin: 15.4 g/dL (ref 13.0–17.0)
MCH: 29.8 pg (ref 26.0–34.0)
MCHC: 34.2 g/dL (ref 30.0–36.0)
MCV: 87.2 fL (ref 78.0–100.0)
PLATELETS: 253 10*3/uL (ref 150–400)
RBC: 5.16 MIL/uL (ref 4.22–5.81)
RDW: 13.7 % (ref 11.5–15.5)
WBC: 11.2 10*3/uL — AB (ref 4.0–10.5)

## 2015-12-21 LAB — GLUCOSE, CAPILLARY
GLUCOSE-CAPILLARY: 269 mg/dL — AB (ref 65–99)
GLUCOSE-CAPILLARY: 222 mg/dL — AB (ref 65–99)
GLUCOSE-CAPILLARY: 129 mg/dL — AB (ref 65–99)
GLUCOSE-CAPILLARY: 221 mg/dL — AB (ref 65–99)
Glucose-Capillary: 125 mg/dL — ABNORMAL HIGH (ref 65–99)

## 2015-12-21 SURGERY — ANTERIOR CERVICAL DECOMPRESSION/DISCECTOMY FUSION 2 LEVELS
Anesthesia: General

## 2015-12-21 SURGERY — ANTERIOR CERVICAL DECOMPRESSION/DISCECTOMY FUSION 2 LEVELS
Anesthesia: General | Site: Neck

## 2015-12-21 MED ORDER — OXYCODONE-ACETAMINOPHEN 5-325 MG PO TABS
1.0000 | ORAL_TABLET | ORAL | Status: DC | PRN
  Administered 2015-12-21 – 2015-12-22 (×5): 2 via ORAL
  Filled 2015-12-21 (×4): qty 2

## 2015-12-21 MED ORDER — MIDAZOLAM HCL 2 MG/2ML IJ SOLN
INTRAMUSCULAR | Status: AC
  Filled 2015-12-21: qty 2

## 2015-12-21 MED ORDER — RAMIPRIL 2.5 MG PO CAPS
2.5000 mg | ORAL_CAPSULE | Freq: Every day | ORAL | Status: DC
  Administered 2015-12-21: 2.5 mg via ORAL
  Filled 2015-12-21 (×3): qty 1

## 2015-12-21 MED ORDER — PIOGLITAZONE HCL 15 MG PO TABS: 30.0000 mg | ORAL_TABLET | Freq: Every day | ORAL | Status: DC

## 2015-12-21 MED ORDER — OXYCODONE-ACETAMINOPHEN 5-325 MG PO TABS
ORAL_TABLET | ORAL | Status: AC
  Administered 2015-12-21: 2 via ORAL
  Filled 2015-12-21: qty 2

## 2015-12-21 MED ORDER — FENTANYL CITRATE (PF) 250 MCG/5ML IJ SOLN
INTRAMUSCULAR | Status: DC | PRN
  Administered 2015-12-21: 50 ug via INTRAVENOUS
  Administered 2015-12-21 (×2): 100 ug via INTRAVENOUS

## 2015-12-21 MED ORDER — GLYCOPYRROLATE 0.2 MG/ML IJ SOLN
INTRAMUSCULAR | Status: DC | PRN
  Administered 2015-12-21: .6 mg via INTRAVENOUS

## 2015-12-21 MED ORDER — BUPIVACAINE HCL (PF) 0.25 % IJ SOLN
INTRAMUSCULAR | Status: DC | PRN
  Administered 2015-12-21: 6 mL

## 2015-12-21 MED ORDER — HYDROMORPHONE HCL 1 MG/ML IJ SOLN
0.2500 mg | INTRAMUSCULAR | Status: DC | PRN
  Administered 2015-12-21 (×4): 0.5 mg via INTRAVENOUS

## 2015-12-21 MED ORDER — GABAPENTIN 800 MG PO TABS
800.0000 mg | ORAL_TABLET | Freq: Three times a day (TID) | ORAL | Status: DC
  Filled 2015-12-21 (×2): qty 1

## 2015-12-21 MED ORDER — HEMOSTATIC AGENTS (NO CHARGE) OPTIME
TOPICAL | Status: DC | PRN
  Administered 2015-12-21: 1 via TOPICAL

## 2015-12-21 MED ORDER — DIAZEPAM 5 MG PO TABS
ORAL_TABLET | ORAL | Status: AC
  Administered 2015-12-21: 5 mg via ORAL
  Filled 2015-12-21: qty 1

## 2015-12-21 MED ORDER — LIDOCAINE HCL (CARDIAC) 20 MG/ML IV SOLN
INTRAVENOUS | Status: DC | PRN
  Administered 2015-12-21: 60 mg via INTRATRACHEAL
  Administered 2015-12-21: 60 mg via INTRAVENOUS

## 2015-12-21 MED ORDER — THROMBIN 5000 UNITS EX SOLR
CUTANEOUS | Status: DC | PRN
  Administered 2015-12-21 (×2): 5000 [IU] via TOPICAL

## 2015-12-21 MED ORDER — INSULIN ASPART 100 UNIT/ML ~~LOC~~ SOLN: 0.0000 [IU] | SUBCUTANEOUS | Status: DC

## 2015-12-21 MED ORDER — TRAZODONE HCL 150 MG PO TABS
150.0000 mg | ORAL_TABLET | Freq: Every day | ORAL | Status: DC
  Administered 2015-12-21: 150 mg via ORAL
  Filled 2015-12-21 (×3): qty 1

## 2015-12-21 MED ORDER — FENTANYL CITRATE (PF) 250 MCG/5ML IJ SOLN
INTRAMUSCULAR | Status: AC
  Filled 2015-12-21: qty 5

## 2015-12-21 MED ORDER — LAMOTRIGINE 150 MG PO TABS
300.0000 mg | ORAL_TABLET | Freq: Every day | ORAL | Status: DC
  Administered 2015-12-22: 300 mg via ORAL
  Filled 2015-12-21 (×2): qty 2

## 2015-12-21 MED ORDER — LIDOCAINE HCL (CARDIAC) 20 MG/ML IV SOLN
INTRAVENOUS | Status: AC
  Filled 2015-12-21: qty 10

## 2015-12-21 MED ORDER — LURASIDONE HCL 40 MG PO TABS
40.0000 mg | ORAL_TABLET | Freq: Every day | ORAL | Status: DC
  Administered 2015-12-22: 40 mg via ORAL
  Filled 2015-12-21 (×2): qty 1

## 2015-12-21 MED ORDER — CEFAZOLIN SODIUM 1-5 GM-% IV SOLN
1.0000 g | Freq: Three times a day (TID) | INTRAVENOUS | Status: AC
  Administered 2015-12-21 – 2015-12-22 (×2): 1 g via INTRAVENOUS
  Filled 2015-12-21 (×2): qty 50

## 2015-12-21 MED ORDER — MIDAZOLAM HCL 5 MG/5ML IJ SOLN
INTRAMUSCULAR | Status: DC | PRN
  Administered 2015-12-21: 2 mg via INTRAVENOUS

## 2015-12-21 MED ORDER — EPHEDRINE SULFATE 50 MG/ML IJ SOLN
INTRAMUSCULAR | Status: DC | PRN
  Administered 2015-12-21: 10 mg via INTRAVENOUS

## 2015-12-21 MED ORDER — NEOSTIGMINE METHYLSULFATE 10 MG/10ML IV SOLN
INTRAVENOUS | Status: AC
  Filled 2015-12-21: qty 1

## 2015-12-21 MED ORDER — DIAZEPAM 5 MG/ML IJ SOLN
INTRAMUSCULAR | Status: AC
  Administered 2015-12-21: 2.5 mg via INTRAVENOUS
  Filled 2015-12-21: qty 2

## 2015-12-21 MED ORDER — FLUTICASONE FUROATE-VILANTEROL 100-25 MCG/INH IN AEPB: 1.0000 | INHALATION_SPRAY | Freq: Every day | RESPIRATORY_TRACT | Status: DC

## 2015-12-21 MED ORDER — FENTANYL CITRATE (PF) 100 MCG/2ML IJ SOLN
50.0000 ug | INTRAMUSCULAR | Status: DC | PRN
  Administered 2015-12-21: 50 ug via INTRAVENOUS

## 2015-12-21 MED ORDER — LACTATED RINGERS IV SOLN: INTRAVENOUS | Status: DC

## 2015-12-21 MED ORDER — HYDROXYZINE HCL 25 MG PO TABS
50.0000 mg | ORAL_TABLET | Freq: Every day | ORAL | Status: DC
  Administered 2015-12-21: 50 mg via ORAL
  Filled 2015-12-21: qty 2

## 2015-12-21 MED ORDER — THROMBIN 5000 UNITS EX SOLR
CUTANEOUS | Status: DC | PRN
  Administered 2015-12-21: 16:00:00 via TOPICAL

## 2015-12-21 MED ORDER — LACTATED RINGERS IV SOLN
INTRAVENOUS | Status: DC
  Administered 2015-12-21: 16:00:00 via INTRAVENOUS
  Administered 2015-12-21: 50 mL/h via INTRAVENOUS

## 2015-12-21 MED ORDER — HYDROMORPHONE HCL 1 MG/ML IJ SOLN
1.0000 mg | INTRAMUSCULAR | Status: DC | PRN
  Administered 2015-12-21 – 2015-12-22 (×4): 1 mg via INTRAVENOUS
  Filled 2015-12-21 (×4): qty 1

## 2015-12-21 MED ORDER — ACETAMINOPHEN 325 MG PO TABS: 650.0000 mg | ORAL_TABLET | ORAL | Status: DC | PRN

## 2015-12-21 MED ORDER — ACETAMINOPHEN 650 MG RE SUPP: 650.0000 mg | RECTAL | Status: DC | PRN

## 2015-12-21 MED ORDER — GLYCOPYRROLATE 0.2 MG/ML IJ SOLN
INTRAMUSCULAR | Status: AC
  Filled 2015-12-21: qty 2

## 2015-12-21 MED ORDER — POTASSIUM CHLORIDE IN NACL 20-0.9 MEQ/L-% IV SOLN
INTRAVENOUS | Status: DC
  Filled 2015-12-21 (×3): qty 1000

## 2015-12-21 MED ORDER — 0.9 % SODIUM CHLORIDE (POUR BTL) OPTIME
TOPICAL | Status: DC | PRN
  Administered 2015-12-21: 1000 mL

## 2015-12-21 MED ORDER — ROCURONIUM BROMIDE 50 MG/5ML IV SOLN
INTRAVENOUS | Status: AC
  Filled 2015-12-21: qty 1

## 2015-12-21 MED ORDER — DIAZEPAM 5 MG/ML IJ SOLN
2.5000 mg | Freq: Once | INTRAMUSCULAR | Status: AC
  Administered 2015-12-21: 2.5 mg via INTRAVENOUS

## 2015-12-21 MED ORDER — PROPOFOL 10 MG/ML IV BOLUS
INTRAVENOUS | Status: AC
  Filled 2015-12-21: qty 20

## 2015-12-21 MED ORDER — SODIUM CHLORIDE 0.9% FLUSH
3.0000 mL | Freq: Two times a day (BID) | INTRAVENOUS | Status: DC
Start: 2015-12-21 — End: 2015-12-22

## 2015-12-21 MED ORDER — PROPOFOL 10 MG/ML IV BOLUS
INTRAVENOUS | Status: DC | PRN
  Administered 2015-12-21: 200 mg via INTRAVENOUS

## 2015-12-21 MED ORDER — ONDANSETRON HCL 4 MG/2ML IJ SOLN: 4.0000 mg | INTRAMUSCULAR | Status: DC | PRN

## 2015-12-21 MED ORDER — ONDANSETRON HCL 4 MG/2ML IJ SOLN
INTRAMUSCULAR | Status: DC | PRN
  Administered 2015-12-21: 4 mg via INTRAVENOUS

## 2015-12-21 MED ORDER — MORPHINE SULFATE (PF) 2 MG/ML IV SOLN
1.0000 mg | INTRAVENOUS | Status: DC | PRN
  Filled 2015-12-21: qty 2

## 2015-12-21 MED ORDER — GABAPENTIN 400 MG PO CAPS
800.0000 mg | ORAL_CAPSULE | Freq: Three times a day (TID) | ORAL | Status: DC
  Administered 2015-12-21: 800 mg via ORAL

## 2015-12-21 MED ORDER — HYDROMORPHONE HCL 1 MG/ML IJ SOLN
INTRAMUSCULAR | Status: AC
  Administered 2015-12-21: 0.5 mg via INTRAVENOUS
  Filled 2015-12-21: qty 1

## 2015-12-21 MED ORDER — DIAZEPAM 5 MG PO TABS
5.0000 mg | ORAL_TABLET | Freq: Four times a day (QID) | ORAL | Status: DC | PRN
  Administered 2015-12-21: 5 mg via ORAL

## 2015-12-21 MED ORDER — MENTHOL 3 MG MT LOZG
1.0000 | LOZENGE | OROMUCOSAL | Status: DC | PRN
  Filled 2015-12-21: qty 9

## 2015-12-21 MED ORDER — BACITRACIN 50000 UNITS IM SOLR
INTRAMUSCULAR | Status: DC | PRN
  Administered 2015-12-21: 16:00:00

## 2015-12-21 MED ORDER — SODIUM CHLORIDE 0.9 % IV SOLN: 250.0000 mL | INTRAVENOUS | Status: DC

## 2015-12-21 MED ORDER — SODIUM CHLORIDE 0.9% FLUSH: 3.0000 mL | INTRAVENOUS | Status: DC | PRN

## 2015-12-21 MED ORDER — PHENOL 1.4 % MT LIQD: 1.0000 | OROMUCOSAL | Status: DC | PRN

## 2015-12-21 MED ORDER — SERTRALINE HCL 50 MG PO TABS
100.0000 mg | ORAL_TABLET | Freq: Every day | ORAL | Status: DC
Start: 2015-12-22 — End: 2015-12-22
  Administered 2015-12-22: 100 mg via ORAL
  Filled 2015-12-21: qty 2

## 2015-12-21 MED ORDER — NEOSTIGMINE METHYLSULFATE 10 MG/10ML IV SOLN
INTRAVENOUS | Status: DC | PRN
  Administered 2015-12-21: 4 mg via INTRAVENOUS

## 2015-12-21 MED ORDER — ROCURONIUM BROMIDE 100 MG/10ML IV SOLN
INTRAVENOUS | Status: DC | PRN
  Administered 2015-12-21: 50 mg via INTRAVENOUS

## 2015-12-21 MED ORDER — FENTANYL CITRATE (PF) 100 MCG/2ML IJ SOLN
INTRAMUSCULAR | Status: AC
  Filled 2015-12-21: qty 2

## 2015-12-21 MED ORDER — PHENYLEPHRINE HCL 10 MG/ML IJ SOLN
INTRAMUSCULAR | Status: DC | PRN
  Administered 2015-12-21 (×2): 80 ug via INTRAVENOUS

## 2015-12-21 MED ORDER — MIRABEGRON ER 50 MG PO TB24
50.0000 mg | ORAL_TABLET | Freq: Every day | ORAL | Status: DC
Start: 2015-12-22 — End: 2015-12-22
  Administered 2015-12-22: 50 mg via ORAL
  Filled 2015-12-21 (×2): qty 1

## 2015-12-21 MED ORDER — ONDANSETRON HCL 4 MG/2ML IJ SOLN
INTRAMUSCULAR | Status: AC
  Filled 2015-12-21: qty 2

## 2015-12-21 MED ORDER — TIZANIDINE HCL 4 MG PO TABS
4.0000 mg | ORAL_TABLET | Freq: Three times a day (TID) | ORAL | Status: DC | PRN
  Administered 2015-12-21 – 2015-12-22 (×2): 4 mg via ORAL
  Filled 2015-12-21 (×5): qty 1

## 2015-12-21 SURGICAL SUPPLY — 53 items
APL SKNCLS STERI-STRIP NONHPOA (GAUZE/BANDAGES/DRESSINGS) ×2
BAG DECANTER FOR FLEXI CONT (MISCELLANEOUS) ×3 IMPLANT
BENZOIN TINCTURE PRP APPL 2/3 (GAUZE/BANDAGES/DRESSINGS) ×3 IMPLANT
BIT DRILL POWER (BIT) ×1 IMPLANT
BUR MATCHSTICK NEURO 3.0 LAGG (BURR) ×3 IMPLANT
CAGE SMALL 7X13X15 (Cage) ×4 IMPLANT
CANISTER SUCT 3000ML PPV (MISCELLANEOUS) ×3 IMPLANT
DRAPE C-ARM 42X72 X-RAY (DRAPES) ×6 IMPLANT
DRAPE LAPAROTOMY 100X72 PEDS (DRAPES) ×3 IMPLANT
DRAPE MICROSCOPE LEICA (MISCELLANEOUS) ×3 IMPLANT
DRAPE POUCH INSTRU U-SHP 10X18 (DRAPES) ×3 IMPLANT
DRILL BIT POWER (BIT) ×3
DRSG OPSITE POSTOP 3X4 (GAUZE/BANDAGES/DRESSINGS) ×2 IMPLANT
DURAPREP 6ML APPLICATOR 50/CS (WOUND CARE) ×3 IMPLANT
ELECT COATED BLADE 2.86 ST (ELECTRODE) ×3 IMPLANT
ELECT REM PT RETURN 9FT ADLT (ELECTROSURGICAL) ×3
ELECTRODE REM PT RTRN 9FT ADLT (ELECTROSURGICAL) ×2 IMPLANT
GAUZE SPONGE 4X4 16PLY XRAY LF (GAUZE/BANDAGES/DRESSINGS) IMPLANT
GLOVE BIO SURGEON STRL SZ8 (GLOVE) ×3 IMPLANT
GLOVE BIOGEL PI IND STRL 7.0 (GLOVE) ×1 IMPLANT
GLOVE BIOGEL PI IND STRL 8 (GLOVE) ×2 IMPLANT
GLOVE BIOGEL PI INDICATOR 7.0 (GLOVE) ×1
GLOVE BIOGEL PI INDICATOR 8 (GLOVE) ×2
GLOVE ECLIPSE 7.5 STRL STRAW (GLOVE) ×6 IMPLANT
GLOVE ECLIPSE 9.0 STRL (GLOVE) ×2 IMPLANT
GLOVE SS N UNI LF 6.5 STRL (GLOVE) ×6 IMPLANT
GOWN STRL REUS W/ TWL LRG LVL3 (GOWN DISPOSABLE) IMPLANT
GOWN STRL REUS W/ TWL XL LVL3 (GOWN DISPOSABLE) ×2 IMPLANT
GOWN STRL REUS W/TWL 2XL LVL3 (GOWN DISPOSABLE) ×3 IMPLANT
GOWN STRL REUS W/TWL LRG LVL3 (GOWN DISPOSABLE)
GOWN STRL REUS W/TWL XL LVL3 (GOWN DISPOSABLE) ×6
HEMOSTAT POWDER KIT SURGIFOAM (HEMOSTASIS) ×3 IMPLANT
KIT BASIN OR (CUSTOM PROCEDURE TRAY) ×3 IMPLANT
KIT ROOM TURNOVER OR (KITS) ×3 IMPLANT
NDL HYPO 25X1 1.5 SAFETY (NEEDLE) ×1 IMPLANT
NDL SPNL 20GX3.5 QUINCKE YW (NEEDLE) ×1 IMPLANT
NEEDLE HYPO 25X1 1.5 SAFETY (NEEDLE) ×3 IMPLANT
NEEDLE SPNL 20GX3.5 QUINCKE YW (NEEDLE) ×3 IMPLANT
NS IRRIG 1000ML POUR BTL (IV SOLUTION) ×3 IMPLANT
PACK LAMINECTOMY NEURO (CUSTOM PROCEDURE TRAY) ×3 IMPLANT
PAD ARMBOARD 7.5X6 YLW CONV (MISCELLANEOUS) ×5 IMPLANT
PIN DISTRACTION 14MM (PIN) ×4 IMPLANT
PLATE ARCHON 42MM 2LVL (Plate) ×2 IMPLANT
RUBBERBAND STERILE (MISCELLANEOUS) ×6 IMPLANT
SCREW ARCHON SELFTAP 4.0X13 (Screw) ×12 IMPLANT
SPONGE INTESTINAL PEANUT (DISPOSABLE) ×3 IMPLANT
SPONGE SURGIFOAM ABS GEL SZ50 (HEMOSTASIS) ×3 IMPLANT
STRIP CLOSURE SKIN 1/2X4 (GAUZE/BANDAGES/DRESSINGS) ×3 IMPLANT
SUT VIC AB 3-0 SH 8-18 (SUTURE) ×5 IMPLANT
TOWEL OR 17X24 6PK STRL BLUE (TOWEL DISPOSABLE) ×3 IMPLANT
TOWEL OR 17X26 10 PK STRL BLUE (TOWEL DISPOSABLE) ×3 IMPLANT
TRAP SPECIMEN MUCOUS 40CC (MISCELLANEOUS) IMPLANT
WATER STERILE IRR 1000ML POUR (IV SOLUTION) ×3 IMPLANT

## 2015-12-21 NOTE — Progress Notes (Signed)
Pt still having significant pain after IV dilaudid. MD aware. Will give valium per new order & continue to monitor.

## 2015-12-21 NOTE — Transfer of Care (Signed)
Immediate Anesthesia Transfer of Care Note  Patient: [name].  Procedure(s) Performed: Procedure(s): Cervical four-five, Cervical five-six Anterior Cervical Discectomy Fusion (N/A)  Patient Location: PACU  Anesthesia Type:General  Level of Consciousness: awake, alert  and oriented  Airway & Oxygen Therapy: Patient Spontanous Breathing and Patient connected to nasal cannula oxygen  Post-op Assessment: Report given to RN and Post -op Vital signs reviewed and stable  Post vital signs: Reviewed and stable  Last Vitals:  Filed Vitals:   12/21/15 1055 12/21/15 1701  BP: 136/90 148/99  Pulse: 99 103  Temp: 36.6 C 36.3 C  Resp:  17    Complications: No apparent anesthesia complications

## 2015-12-21 NOTE — Progress Notes (Signed)
Spoke with Dr.Ewell about patient having a Vgo insulin pump, cbg 269.  He stated patient was okay to leave pump on.  Notified Hilda Lias, Diabetes Coord. About patient having surgery today 115-250pm in Neuro and has Vgo pump.

## 2015-12-21 NOTE — Anesthesia Postprocedure Evaluation (Signed)
Anesthesia Post Note  Patient: [name].  Procedure(s) Performed: Procedure(s) (LRB): Cervical four-five, Cervical five-six Anterior Cervical Discectomy Fusion (N/A)  Patient location during evaluation: PACU Anesthesia Type: General Level of consciousness: awake and alert Pain management: pain level controlled Vital Signs Assessment: post-procedure vital signs reviewed and stable Respiratory status: spontaneous breathing, nonlabored ventilation, respiratory function stable and patient connected to nasal cannula oxygen Cardiovascular status: blood pressure returned to baseline and stable Postop Assessment: no signs of nausea or vomiting Anesthetic complications: no    Last Vitals:  Filed Vitals:   12/21/15 1745 12/21/15 1800  BP: 145/92   Pulse: 87 85  Temp:  36.7 C  Resp: 12 16    Last Pain:  Filed Vitals:   12/21/15 1821  PainSc: 4                  Devanshi Califf L

## 2015-12-21 NOTE — Anesthesia Procedure Notes (Signed)
Procedure Name: Intubation Date/Time: 12/21/2015 3:29 PM Performed by: Julian Reil Pre-anesthesia Checklist: Patient identified, Emergency Drugs available, Suction available and Patient being monitored Patient Re-evaluated:Patient Re-evaluated prior to inductionOxygen Delivery Method: Circle system utilized Preoxygenation: Pre-oxygenation with 100% oxygen Intubation Type: IV induction Ventilation: Mask ventilation without difficulty and Oral airway inserted - appropriate to patient size Laryngoscope Size: Mac and 4 Grade View: Grade II Tube type: Oral Tube size: 7.5 mm Number of attempts: 1 Airway Equipment and Method: Stylet and LTA kit utilized Placement Confirmation: ETT inserted through vocal cords under direct vision,  positive ETCO2 and breath sounds checked- equal and bilateral Secured at: 22 cm Tube secured with: Tape Dental Injury: Teeth and Oropharynx as per pre-operative assessment

## 2015-12-21 NOTE — Progress Notes (Signed)
[name] pain level is better controlled after IV Valium. VSS. O2 sats consistently running 95-98% on room air. Transferred to 3 c.

## 2015-12-21 NOTE — Anesthesia Preprocedure Evaluation (Signed)
Anesthesia Evaluation  Patient identified by MRN, date of birth, ID band Patient awake    Reviewed: Allergy & Precautions, H&P , NPO status , Patient's Chart, lab work & pertinent test results  Airway Mallampati: II  TM Distance: >3 FB Neck ROM: full    Dental no notable dental hx. (+) Dental Advisory Given, Teeth Intact   Pulmonary COPD, Current Smoker,    Pulmonary exam normal breath sounds clear to auscultation       Cardiovascular hypertension, Pt. 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   Dental Advisory Given  Plan Discussed with: CRNA and Surgeon  Anesthesia Plan Comments:         Anesthesia Quick Evaluation

## 2015-12-21 NOTE — Op Note (Signed)
12/21/2015  5:14 PM  PATIENT:  [name].  40 y.o. 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 Decompressive anterior cervical discectomy C4-5 C5-6, 2. Anterior cervical arthrodesis C4-5 C5-6 utilizing 7 mm peek interbody cages packed with morcellized autograft , 3. Anterior cervical plating C4-5 C5-6 utilizing a nuvasive archon plate  SURGEON:  Marikay Alar, MD  ASSISTANTS: Pool  ANESTHESIA:   General  EBL: 100 ml  Total I/O In: 1400 [I.V.:1400] Out: 100 [Blood:100]  BLOOD ADMINISTERED:none  DRAINS: none   SPECIMEN:  No Specimen  INDICATION FOR PROCEDURE: This patient presented with a 2 year history of chronic progressive neck and left shoulder and arm pain. MRI showed mild spondylosis at C4-5 and C5-6 but was suggestive of left foraminal stenosis. There seemed to be a leftward disc bulge at C5-6. He tried medical management including injections without relief. I recommended decompressive surgery in the hopes of improvement of his pain syndrome. Patient understood the risks, benefits, and alternatives and potential outcomes and wished to proceed.  PROCEDURE DETAILS: Patient was brought to the operating room placed under general endotracheal anesthesia. Patient was placed in the supine position on the operating room table. The neck was prepped with Duraprep and draped in a sterile fashion.   Three cc of local anesthesia was injected and a transverse incision was made on the right side of the neck.  Dissection was carried down thru the subcutaneous tissue and the platysma was  elevated, opened, and undermined with Metzenbaum scissors.  Dissection was then carried out thru an avascular plane leaving the sternocleidomastoid carotid artery and jugular vein laterally and the trachea and esophagus medially. The ventral aspect of the vertebral column was identified and a localizing x-ray was taken.  The C5-6 level was identified. The longus colli muscles were then elevated and the retractor was placed to expose C4-5 and C5-6. The annulus was incised and the disc space entered at both levels. Discectomy was performed with micro-curettes and pituitary rongeurs. I then used the high-speed drill to drill the endplates down to the level of the posterior longitudinal ligament. The drill shavings were saved in a mucous trap for later arthrodesis. The operating microscope was draped and brought into the field provided additional magnification, illumination and visualization. Discectomy was continued posteriorly thru the disc space. Posterior longitudinal ligament was opened with a nerve hook, and then removed along with disc herniation and osteophytes, decompressing the spinal canal and thecal sac. We then continued to remove osteophytic overgrowth and disc material decompressing the neural foramina and exiting nerve roots bilaterally. The scope was angled up and down to help decompress and undercut the vertebral bodies. Once the decompression was completed we could pass a nerve hook circumferentially to assure adequate decompression in the midline and in the neural foramina. So by both visualization and palpation we felt we had an adequate decompression of the neural elements. We then measured the height of the intravertebral disc space and selected a 7 millimeter Peek interbody cage packed with autograft. It was then gently positioned in the intravertebral disc space and countersunk at both levels. I then used a 42 mm plate and placed variable angle screws into the vertebral bodies and locked them into position. The wound was irrigated with bacitracin solution, checked for hemostasis which was established and confirmed. Once meticulous hemostasis was achieved, we then proceeded with closure. The platysma was closed with interrupted 3-0  undyed Vicryl suture, the subcuticular layer was closed with interrupted 3-0  undyed Vicryl suture. The skin edges were approximated with steristrips. The drapes were removed. A sterile dressing was applied. The patient was then awakened from general anesthesia and transferred to the recovery room in stable condition. At the end of the procedure all sponge, needle and instrument counts were correct.   PLAN OF CARE: Admit for overnight observation  PATIENT DISPOSITION:  PACU - hemodynamically stable.   Delay start of Pharmacological VTE agent (>24hrs) due to surgical blood loss or risk of bleeding:  yes

## 2015-12-21 NOTE — H&P (Signed)
Subjective:   Patient is a 40 y.o. male admitted for ACDF. The patient first presented to me with complaints of neck pain and shooting pains in the arm(s). Onset of symptoms was several months ago. The pain is described as aching and occurs all day. The pain is rated severe, and is located  In the neck and radiates to the LUE. The symptoms have been progressive. Symptoms are exacerbated by none, and are relieved by none.  Previous work up includes MRI of cervical spine, results: disc bulge at C4-C5 and C5-C6 bilateral.  Past Medical History  Diagnosis Date  . Allergy   . Depression   . GERD (gastroesophageal reflux disease)   . Hyperlipidemia   . Chronic pain disorder     Sees [facility]  . Urinary incontinence     detrusor instability  . Hypogonadism   . Diabetes mellitus     Type II  . Chronic neck pain   . Bipolar depression ([place])   . Opiate dependence, continuous ([place])   . Polysubstance abuse   . Drug-seeking behavior   . Hypertension   . Pneumonia     11/2014- tx at [facility]   . Arthritis     cervical- disc disease     Past Surgical History  Procedure Laterality Date  . Nasal sinus surgery  2008  . Hernia repair  2002    abdominal  . Appendectomy  2002  . Cardiac catheterization  2006    done due to pain in his chest, no blockage seen    Allergies  Allergen Reactions  . Tramadol Other (See Comments)    SERATONIN SYNDROME DIZZINESS  . Ciprofloxacin Hives    Social History  Substance Use Topics  . Smoking status: Current Every Day Smoker -- 1.50 packs/day for 25 years    Types: Cigarettes  . Smokeless tobacco: Never Used  . Alcohol Use: No    Family History  Problem Relation Age of Onset  . Diabetes Mother   . Hypertension Mother   . Cirrhosis Mother   . Depression Mother   . Bipolar disorder Mother   . Arthritis Father 61    osteoarthritis  . Bipolar disorder Father   . Diabetes Sister     borderline  . Heart disease  Paternal Uncle 54    MI  . Heart disease Maternal Grandfather     late 60's--MI  . Cancer Paternal Grandmother 60    lung  . Bipolar disorder Paternal Grandmother   . Heart disease Paternal Grandfather 53    MI  . Bipolar disorder Paternal Grandfather    Prior to Admission medications   Medication Sig Start Date End Date Taking? Authorizing Provider  cetirizine (ZYRTEC) 10 MG tablet Take 1 tablet (10 mg total) by mouth daily. For allergies 11/21/15  Yes Encarnacion Slates, NP  gabapentin (NEURONTIN) 800 MG tablet Take 1 tablet (800 mg total) by mouth 3 (three) times daily. For agitation 11/21/15  Yes Encarnacion Slates, NP  hydrOXYzine (ATARAX/VISTARIL) 50 MG tablet Take 1 tablet (50 mg total) by mouth at bedtime. Take 2 tablets (100 mg) by mouth at bedtime 12/14/15  Yes Kathlee Nations, MD  lamoTRIgine (LAMICTAL) 150 MG tablet Take 2 tablets (300 mg total) by mouth daily. For mood stabilization Patient taking differently: Take 300 mg by mouth daily before breakfast. For mood stabilization 11/26/15  Yes Kathlee Nations, MD  lurasidone (LATUDA) 40 MG TABS tablet Take 1 tablet (40  mg total) by mouth daily with breakfast. For mood control 11/26/15  Yes Kathlee Nations, MD  mirabegron ER (MYRBETRIQ) 50 MG TB24 tablet Take 1 tablet (50 mg total) by mouth daily. For bladder control Patient taking differently: Take 50 mg by mouth daily before breakfast. For bladder control 11/21/15  Yes Encarnacion Slates, NP  oxyCODONE-acetaminophen (PERCOCET/ROXICET) 5-325 MG tablet Take 1-2 tablets by mouth every 4 (four) hours as needed for severe pain.   Yes Historical Provider, MD  pioglitazone (ACTOS) 30 MG tablet TAKE 1 TABLET (30 MG TOTAL) BY MOUTH DAILY: For Diabetes managment 11/21/15  Yes Encarnacion Slates, NP  ramipril (ALTACE) 2.5 MG capsule Take 1 capsule (2.5 mg total) by mouth daily. For high blood pressure 11/21/15  Yes Encarnacion Slates, NP  sertraline (ZOLOFT) 100 MG tablet Take 1 tablet (100 mg total) by mouth daily. Patient taking  differently: Take 100 mg by mouth daily before breakfast.  11/26/15 11/25/16 Yes Kathlee Nations, MD  simvastatin (ZOCOR) 10 MG tablet Take 1 tablet (10 mg total) by mouth at bedtime. For high cholesterol 11/21/15  Yes Encarnacion Slates, NP  tiZANidine (ZANAFLEX) 2 MG tablet Take 2 mg by mouth 3 (three) times daily as needed. 10/20/15  Yes Historical Provider, MD  traZODone (DESYREL) 150 MG tablet Take 1 tablet (150 mg total) by mouth at bedtime. 11/26/15  Yes Kathlee Nations, MD  cyclobenzaprine (FLEXERIL) 10 MG tablet Take 1 tablet (10 mg total) by mouth 2 (two) times daily. Muscle spasm Patient not taking: Reported on 12/06/2015 11/21/15   Encarnacion Slates, NP  Fluticasone Furoate-Vilanterol (BREO ELLIPTA) 100-25 MCG/INH AEPB Inhale 1 puff into the lungs daily. For shortness of breath 11/21/15   Encarnacion Slates, NP  insulin aspart (NOVOLOG) 100 UNIT/ML injection Use max 58 units with V-go pump: For diabetes management Patient not taking: Reported on 12/06/2015 11/21/15   Encarnacion Slates, NP  Insulin Disposable Pump (V-GO 20) KIT USE AS DIRECTED: For diabetes management 11/21/15   Encarnacion Slates, NP  insulin glargine (LANTUS) 100 UNIT/ML injection Inject 0.2 mLs (20 Units total) into the skin at bedtime. For diabetes management Patient not taking: Reported on 12/06/2015 11/21/15   Encarnacion Slates, NP  insulin lispro (HUMALOG) 100 UNIT/ML injection Inject 58 Units into the skin continuous. V-go pump    Historical Provider, MD     Review of Systems  Positive ROS: neg  All other systems have been reviewed and were otherwise negative with the exception of those mentioned in the HPI and as above.  Objective: Vital signs in last 24 hours: Temp:  [97.8 F (36.6 C)] 97.8 F (36.6 C) (02/10 1055) Pulse Rate:  [99] 99 (02/10 1055) BP: (136)/(90) 136/90 mmHg (02/10 1055) SpO2:  [100 %] 100 % (02/10 1055) Weight:  [93.804 kg (206 lb 12.8 oz)] 93.804 kg (206 lb 12.8 oz) (02/10 1055)  General Appearance: Alert, cooperative, no  distress, appears stated age Head: Normocephalic, without obvious abnormality, atraumatic Eyes: PERRL, conjunctiva/corneas clear, EOM's intact      Neck: Supple, symmetrical, trachea midline, Back: Symmetric, no curvature, ROM normal, no CVA tenderness Lungs:  respirations unlabored Heart: Regular rate and rhythm Abdomen: Soft, non-tender Extremities: Extremities normal, atraumatic, no cyanosis or edema Pulses: 2+ and symmetric all extremities Skin: Skin color, texture, turgor normal, no rashes or lesions  NEUROLOGIC:  Mental status: Alert and oriented x4, no aphasia, good attention span, fund of knowledge and memory  Motor Exam - grossly  normal Sensory Exam - grossly normal Reflexes: 1+ Coordination - grossly normal Gait - grossly normal Balance - grossly normal Cranial Nerves: I: smell Not tested  II: visual acuity  OS: nl    OD: nl  II: visual fields Full to confrontation  II: pupils Equal, round, reactive to light  III,VII: ptosis None  III,IV,VI: extraocular muscles  Full ROM  V: mastication Normal  V: facial light touch sensation  Normal  V,VII: corneal reflex  Present  VII: facial muscle function - upper  Normal  VII: facial muscle function - lower Normal  VIII: hearing Not tested  IX: soft palate elevation  Normal  IX,X: gag reflex Present  XI: trapezius strength  5/5  XI: sternocleidomastoid strength 5/5  XI: neck flexion strength  5/5  XII: tongue strength  Normal    Data Review Lab Results  Component Value Date   WBC 11.2* 12/21/2015   HGB 15.4 12/21/2015   HCT 45.0 12/21/2015   MCV 87.2 12/21/2015   PLT 253 12/21/2015   Lab Results  Component Value Date   NA 134* 12/07/2015   K 4.4 12/07/2015   CL 96* 12/07/2015   CO2 29 12/07/2015   BUN 7 12/07/2015   CREATININE 0.87 12/07/2015   GLUCOSE 198* 12/07/2015   Lab Results  Component Value Date   INR 0.99 12/07/2015    Assessment:   Cervical neck pain with herniated nucleus pulposus/  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 Patient has failed conservative therapy. Planned surgery : ACDF with plate C4-5, Q8-2  Plan:   I explained the condition and procedure to the patient and answered any questions.  Patient wishes to proceed with procedure as planned. Understands risks/ benefits/ and expected or typical outcomes.  Alexiz Cothran S 12/21/2015 2:05 PM

## 2015-12-22 DIAGNOSIS — M47812 Spondylosis without myelopathy or radiculopathy, cervical region: Secondary | ICD-10-CM | POA: Diagnosis not present

## 2015-12-22 LAB — GLUCOSE, CAPILLARY: GLUCOSE-CAPILLARY: 262 mg/dL — AB (ref 65–99)

## 2015-12-22 MED ORDER — CYCLOBENZAPRINE HCL 10 MG PO TABS: 10.0000 mg | ORAL_TABLET | Freq: Three times a day (TID) | ORAL | Status: DC | PRN

## 2015-12-22 MED ORDER — OXYCODONE-ACETAMINOPHEN 5-325 MG PO TABS: 1.0000 | ORAL_TABLET | ORAL | Status: DC | PRN

## 2015-12-22 NOTE — Progress Notes (Signed)
Pt given D/C instructions with Rx's, verbal understanding was provided. [name] IV was removed prior to D/C. [name] incision is clean and dry with no sign of infection. Pt D/C'd home via wheelchair @ 1105 per MD order. Pt is stable @ D/C and has no other needs at this time. Emelda Brothers

## 2015-12-22 NOTE — Discharge Instructions (Signed)
Wound Care °Keep incision covered and dry for one week.  If you shower prior to then, cover incision with plastic wrap.  °You may remove outer bandage after one week and shower.  °Do not put any creams, lotions, or ointments on incision. °Leave steri-strips on neck.  They will fall off by themselves. °Activity °Walk each and every day, increasing distance each day. °No lifting greater than 5 lbs.  Avoid excessive neck motion. °No driving for 2 weeks; may ride as a passenger locally. °Wear neck brace at all times except when showering or otherwise instructed. °Diet °Resume your normal diet.  °Return to Work °Will be discussed at you follow up appointment. °Call Your Doctor If Any of These Occur °Redness, drainage, or swelling at the wound.  °Temperature greater than 101 degrees. °Severe pain not relieved by pain medication. °Increased difficulty swallowing.  °Incision starts to come apart. °Follow Up Appt °Call today for appointment in 1-2 weeks ([phone]) or for problems.  If you have any hardware placed in your spine, you will need an x-ray before your appointment. ° °Anterior Cervical Diskectomy and Fusion °Anterior cervical diskectomy and fusion is a surgery that is done on the neck (cervical spine) to take pressure off of the nerves or the spinal cord. It is performed through the front (anterior) part of the neck. During this surgery, the damaged disk that is causing pain, numbness, or weakness is removed. The area where the disk was removed is filled with a plastic spacer implant, a bone graft, or both. These implants and bone grafts take pressure off of the nerves and spinal cord by making more room for the nerves to leave the spine. °LET YOUR HEALTH CARE PROVIDER KNOW ABOUT: °· Any allergies you have. °· All medicines you are taking, including vitamins, herbs, eye drops, creams, and over-the-counter medicines. °· Previous problems you or members of your family have had with the use of anesthetics. °· Any  blood disorders you have. °· Previous surgeries you have had. °· Any medical conditions you may have. °RISKS AND COMPLICATIONS °Generally, this is a safe procedure. However, problems may occur, including: °· Infection. °· Bleeding with the possible need for blood transfusion. °· Injury to surrounding structures, including nerves. °· Leakage of fluid from the brain or spinal cord (cerebrospinal fluid). °· Blood clots. °· Temporary breathing difficulties after surgery. °BEFORE THE PROCEDURE °· Follow your health care provider's instructions about eating or drinking restrictions. °· Ask your health care provider about: °¨ Changing or stopping your regular medicines. This is especially important if you are taking diabetes medicines or blood thinners. °¨ Taking medicines such as aspirin and ibuprofen. These medicines can thin your blood. Do not take these medicines before your procedure if your health care provider instructs you not to. °· You may be given antibiotic medicines to help prevent infection. °· Your incision site may be marked on your neck. °PROCEDURE °· An IV tube will be inserted into one of your veins. °· You will be given one or more of the following: °¨ A medicine that helps you relax (sedative). °¨ A medicine that makes you fall asleep (general anesthetic). °· A breathing tube will be placed. °· Your neck will be cleaned with a germ-killing solution (antiseptic). °· Your surgeon will make an incision on the front of your neck, usually within a skinfold line. °· Your neck muscles will be spread apart, and the damaged disk and bone spurs will be removed. °· The area where the disk   was removed will be filled with a small plastic spacer implant, a bone graft, or both. °· Your surgeon may put metal plates and screws (hardware) in your neck. This helps to stabilize the surgical site and keep implants and bone grafts in place. The hardware reduces motion at the surgical site so your bones can grow together  (fuse). This provides extra support to your neck. °· The incision will be closed with stitches (sutures). °· A bandage (dressing) will be applied to cover the incision. °The procedure may vary among health care providers and hospitals. °AFTER THE PROCEDURE °· Your blood pressure, heart rate, breathing rate, and blood oxygen level will be monitored often until the medicines you were given have worn off.  °· You will be monitored for any signs of complications from the procedure, such as: °¨ Too much bleeding from the incision site. °¨ A buildup of blood under your skin at the surgical site. °¨ Difficulty breathing. °· You may continue to receive antibiotics. °· You can start to eat as soon as you feel comfortable. °· You may be given a neck brace to wear after surgery. This brace limits your neck movement while your bones are fusing together. Follow your health care provider's instructions about how often and how long you need to wear this. °  °This information is not intended to replace advice given to you by your health care provider. Make sure you discuss any questions you have with your health care provider. °  °Document Released: 10/15/2009 Document Revised: 11/17/2014 Document Reviewed: 10/15/2009 °Elsevier Interactive Patient Education ©2016 [employer] ° °

## 2015-12-22 NOTE — Discharge Summary (Signed)
Physician Discharge Summary  Patient ID: [name]. MRN: [id number] DOB/AGE: [birth year] 40 y.o.  Admit date: 12/21/2015 Discharge date: 12/22/2015  Admission Diagnoses: Cervical spondylosis   Discharge Diagnoses: Same   Discharged Condition: good  Hospital Course: The patient was admitted on 12/21/2015 and taken to the operating room where the patient underwent ACDF. The patient tolerated the procedure well and was taken to the recovery room and then to the floor in stable condition. The hospital course was routine. There were no complications. The wound remained clean dry and intact. Pt had appropriate neck soreness. No complaints of back pain or new N/T/W. The patient remained afebrile with stable vital signs, and tolerated a regular diet. The patient continued to increase activities, and pain was well controlled with oral pain medications.   Consults: None  Significant Diagnostic Studies:  Results for orders placed or performed during the hospital encounter of 12/21/15  CBC  Result Value Ref Range   WBC 11.2 (H) 4.0 - 10.5 K/uL   RBC 5.16 4.22 - 5.81 MIL/uL   Hemoglobin 15.4 13.0 - 17.0 g/dL   HCT 45.0 39.0 - 52.0 %   MCV 87.2 78.0 - 100.0 fL   MCH 29.8 26.0 - 34.0 pg   MCHC 34.2 30.0 - 36.0 g/dL   RDW 13.7 11.5 - 15.5 %   Platelets 253 150 - 400 K/uL  Glucose, capillary  Result Value Ref Range   Glucose-Capillary 269 (H) 65 - 99 mg/dL  Glucose, capillary  Result Value Ref Range   Glucose-Capillary 222 (H) 65 - 99 mg/dL  Glucose, capillary  Result Value Ref Range   Glucose-Capillary 125 (H) 65 - 99 mg/dL  Glucose, capillary  Result Value Ref Range   Glucose-Capillary 129 (H) 65 - 99 mg/dL  Glucose, capillary  Result Value Ref Range   Glucose-Capillary 221 (H) 65 - 99 mg/dL   Comment 1 Notify RN    Comment 2 Document in Chart   Glucose, capillary  Result Value Ref Range   Glucose-Capillary 262 (H) 65 - 99 mg/dL    Dg Cervical Spine 1 View  12/21/2015   CLINICAL DATA:  40 year old male with C4-5 and C5-6 fusion. EXAM: DG C-ARM 61-120 MIN; DG CERVICAL SPINE - 1 VIEW COMPARISON:  None. FLUOROSCOPY TIME:  Fluoroscopy Time:  10 seconds Number of Acquired Images:  1 FINDINGS: A single lateral intraoperative view of the cervical spine is submitted postoperatively for interpretation. Anterior plate and screw fixation and interbody fusion material is C4-C5-C6 noted. No definite complicating features noted. IMPRESSION: Anterior fusion at C4-C5-C6. Electronically Signed   By: Margarette Canada M.D.   On: 12/21/2015 20:11   Dg C-arm 1-60 Min  12/21/2015  CLINICAL DATA:  40 year old male with C4-5 and C5-6 fusion. EXAM: DG C-ARM 61-120 MIN; DG CERVICAL SPINE - 1 VIEW COMPARISON:  None. FLUOROSCOPY TIME:  Fluoroscopy Time:  10 seconds Number of Acquired Images:  1 FINDINGS: A single lateral intraoperative view of the cervical spine is submitted postoperatively for interpretation. Anterior plate and screw fixation and interbody fusion material is C4-C5-C6 noted. No definite complicating features noted. IMPRESSION: Anterior fusion at C4-C5-C6. Electronically Signed   By: Margarette Canada M.D.   On: 12/21/2015 20:11    Antibiotics:  Anti-infectives    Start     Dose/Rate Route Frequency Ordered Stop   12/21/15 2300  ceFAZolin (ANCEF) IVPB 1 g/50 mL premix     1 g 100 mL/hr over 30 Minutes Intravenous Every 8 hours 12/21/15  1827 12/22/15 0639   12/21/15 1614  bacitracin 50,000 Units in sodium chloride irrigation 0.9 % 500 mL irrigation  Status:  Discontinued       As needed 12/21/15 1614 12/21/15 1658   12/21/15 1300  ceFAZolin (ANCEF) IVPB 2 g/50 mL premix     2 g 100 mL/hr over 30 Minutes Intravenous To [facility] 12/20/15 1317 12/21/15 1535      Discharge Exam: Blood pressure 129/71, pulse 81, temperature 97.6 F (36.4 C), temperature source Oral, resp. rate 18, height '6\' 1"'  (1.854 m), weight 93.804 kg (206 lb 12.8 oz), SpO2 93 %. Neurologic: Grossly  normal Dressing dry  Discharge Medications:     Medication List    TAKE these medications        cetirizine 10 MG tablet  Commonly known as:  ZYRTEC  Take 1 tablet (10 mg total) by mouth daily. For allergies     cyclobenzaprine 10 MG tablet  Commonly known as:  FLEXERIL  Take 1 tablet (10 mg total) by mouth 3 (three) times daily as needed for muscle spasms. Muscle spasm     Fluticasone Furoate-Vilanterol 100-25 MCG/INH Aepb  Commonly known as:  BREO ELLIPTA  Inhale 1 puff into the lungs daily. For shortness of breath     gabapentin 800 MG tablet  Commonly known as:  NEURONTIN  Take 1 tablet (800 mg total) by mouth 3 (three) times daily. For agitation     hydrOXYzine 50 MG tablet  Commonly known as:  ATARAX/VISTARIL  Take 1 tablet (50 mg total) by mouth at bedtime. Take 2 tablets (100 mg) by mouth at bedtime     insulin aspart 100 UNIT/ML injection  Commonly known as:  novoLOG  Use max 58 units with V-go pump: For diabetes management     insulin glargine 100 UNIT/ML injection  Commonly known as:  LANTUS  Inject 0.2 mLs (20 Units total) into the skin at bedtime. For diabetes management     insulin lispro 100 UNIT/ML injection  Commonly known as:  HUMALOG  Inject 58 Units into the skin continuous. V-go pump     lamoTRIgine 150 MG tablet  Commonly known as:  LAMICTAL  Take 2 tablets (300 mg total) by mouth daily. For mood stabilization     lurasidone 40 MG Tabs tablet  Commonly known as:  LATUDA  Take 1 tablet (40 mg total) by mouth daily with breakfast. For mood control     mirabegron ER 50 MG Tb24 tablet  Commonly known as:  MYRBETRIQ  Take 1 tablet (50 mg total) by mouth daily. For bladder control     oxyCODONE-acetaminophen 5-325 MG tablet  Commonly known as:  PERCOCET/ROXICET  Take 1-2 tablets by mouth every 4 (four) hours as needed for moderate pain or severe pain.     pioglitazone 30 MG tablet  Commonly known as:  ACTOS  TAKE 1 TABLET (30 MG TOTAL) BY  MOUTH DAILY: For Diabetes managment     ramipril 2.5 MG capsule  Commonly known as:  ALTACE  Take 1 capsule (2.5 mg total) by mouth daily. For high blood pressure     sertraline 100 MG tablet  Commonly known as:  ZOLOFT  Take 1 tablet (100 mg total) by mouth daily.     simvastatin 10 MG tablet  Commonly known as:  ZOCOR  Take 1 tablet (10 mg total) by mouth at bedtime. For high cholesterol     tiZANidine 2 MG tablet  Commonly known as:  ZANAFLEX  Take 2 mg by mouth 3 (three) times daily as needed.     traZODone 150 MG tablet  Commonly known as:  DESYREL  Take 1 tablet (150 mg total) by mouth at bedtime.     V-GO 20 Kit  USE AS DIRECTED: For diabetes management        Disposition: Home   Final Dx: ACDF      Discharge Instructions     Remove dressing in 72 hours    Complete by:  As directed      Call MD for:  difficulty breathing, headache or visual disturbances    Complete by:  As directed      Call MD for:  persistant nausea and vomiting    Complete by:  As directed      Call MD for:  redness, tenderness, or signs of infection (pain, swelling, redness, odor or green/yellow discharge around incision site)    Complete by:  As directed      Call MD for:  severe uncontrolled pain    Complete by:  As directed      Call MD for:  temperature >100.4    Complete by:  As directed      Diet - low sodium heart healthy    Complete by:  As directed      Discharge instructions    Complete by:  As directed   Relifting, may shower, no strenuous activity     Increase activity slowly    Complete by:  As directed               Signed: Sydnie Sigmund S 12/22/2015, 8:39 AM

## 2015-12-24 ENCOUNTER — Other Ambulatory Visit (HOSPITAL_COMMUNITY): Payer: Self-pay | Admitting: Psychiatry

## 2015-12-24 ENCOUNTER — Telehealth (HOSPITAL_COMMUNITY): Payer: Self-pay

## 2015-12-24 ENCOUNTER — Encounter (HOSPITAL_COMMUNITY): Payer: Self-pay | Admitting: Neurological Surgery

## 2015-12-24 NOTE — Telephone Encounter (Signed)
He was given with 1 additional refill.  He should not be out before March 16.

## 2015-12-24 NOTE — Telephone Encounter (Signed)
Patient is calling to say that he is out of his Lamictal. Patient has a f/u appointment on 3/16 and was last givin the rx on 1/16 for 30 days. Is it okay to fill another 30 days? Please advise, thank you

## 2015-12-25 NOTE — Telephone Encounter (Signed)
Okay, I called the patient and left him a voicemail letting him know that he needs to check with his pharmacy.

## 2016-01-03 ENCOUNTER — Other Ambulatory Visit (HOSPITAL_COMMUNITY): Payer: Self-pay | Admitting: Psychiatry

## 2016-01-04 ENCOUNTER — Other Ambulatory Visit (HOSPITAL_COMMUNITY): Payer: Self-pay | Admitting: Psychiatry

## 2016-01-07 ENCOUNTER — Other Ambulatory Visit (HOSPITAL_COMMUNITY): Payer: Self-pay | Admitting: Psychiatry

## 2016-01-10 ENCOUNTER — Other Ambulatory Visit (HOSPITAL_COMMUNITY): Payer: Self-pay | Admitting: Psychiatry

## 2016-01-14 ENCOUNTER — Other Ambulatory Visit (HOSPITAL_COMMUNITY): Payer: Self-pay | Admitting: Psychiatry

## 2016-01-16 ENCOUNTER — Telehealth (HOSPITAL_COMMUNITY): Payer: Self-pay

## 2016-01-16 DIAGNOSIS — F3162 Bipolar disorder, current episode mixed, moderate: Secondary | ICD-10-CM

## 2016-01-16 NOTE — Telephone Encounter (Signed)
Patient is calling, he will not have enough medication to last until his appointment on 3/16

## 2016-01-17 MED ORDER — HYDROXYZINE HCL 50 MG PO TABS: ORAL_TABLET | ORAL | Status: DC

## 2016-01-17 NOTE — Telephone Encounter (Signed)
I spoke with Dr. Lolly MustacheArfeen, patient was good on all medication but the Visteril, Dr. Lolly MustacheArfeen gave the okay to fill it for 1 month. I sent this to the pharmacy and the patient was called.

## 2016-01-20 ENCOUNTER — Other Ambulatory Visit (HOSPITAL_COMMUNITY): Payer: Self-pay | Admitting: Psychiatry

## 2016-01-22 ENCOUNTER — Other Ambulatory Visit (HOSPITAL_COMMUNITY): Payer: Self-pay | Admitting: Psychiatry

## 2016-01-22 ENCOUNTER — Other Ambulatory Visit: Payer: Self-pay | Admitting: Endocrinology

## 2016-01-24 ENCOUNTER — Ambulatory Visit (HOSPITAL_COMMUNITY): Payer: Self-pay | Admitting: Psychiatry

## 2016-01-25 ENCOUNTER — Other Ambulatory Visit (HOSPITAL_COMMUNITY): Payer: Self-pay | Admitting: Psychiatry

## 2016-01-25 DIAGNOSIS — F3162 Bipolar disorder, current episode mixed, moderate: Secondary | ICD-10-CM

## 2016-01-28 NOTE — Telephone Encounter (Signed)
Met with Dr. Salem Senate, helping to cover for Dr. Adele Schilder out this week, to discuss patient's status with requested refill of Lamictal 150 mg, two a day.  Informed patient no showed for evaluation on 01/24/16 and has rescheduled for 02/11/16 with Dr. Adele Schilder.  Dr. Salem Senate authorized a one time 30 day new order for patient's Lamictal with notation of evaluation required for further refills.  New order e-scribed to patient's [facility] in [place] on [address] as approved for Lamictal 150 mg, two tablets every day for mood stabilization, #60 with no refills.

## 2016-01-29 ENCOUNTER — Other Ambulatory Visit (HOSPITAL_COMMUNITY): Payer: Self-pay | Admitting: Psychiatry

## 2016-01-29 ENCOUNTER — Telehealth (HOSPITAL_COMMUNITY): Payer: Self-pay

## 2016-01-29 DIAGNOSIS — F3162 Bipolar disorder, current episode mixed, moderate: Secondary | ICD-10-CM

## 2016-01-29 NOTE — Telephone Encounter (Signed)
Patient is calling for a refill on Trazadone and Zoloft, he has a follow up appointment with Dr. Lolly MustacheArfeen on 4/3 but will be out of medication this week. Please advise, thank you

## 2016-01-29 NOTE — Telephone Encounter (Signed)
Met with Dr. Lovena Le, helping to cover for Dr. Adele Schilder out this week, who approved a one time refill of patient's prescribed Trazodone and Sertraline as patient no showed for due evaluation on 01/24/16 but rescheduled for 02/11/16.  Both new 30 day orders e-scribed to patient's [facility] in [place] with notation evaluation required for any further refills.

## 2016-02-08 ENCOUNTER — Ambulatory Visit: Payer: Self-pay | Admitting: Endocrinology

## 2016-02-11 ENCOUNTER — Ambulatory Visit (INDEPENDENT_AMBULATORY_CARE_PROVIDER_SITE_OTHER): Payer: 59 | Admitting: Psychiatry

## 2016-02-11 ENCOUNTER — Encounter (HOSPITAL_COMMUNITY): Payer: Self-pay | Admitting: Psychiatry

## 2016-02-11 VITALS — BP 120/85 | HR 111 | Ht 73.0 in | Wt 199.8 lb

## 2016-02-11 DIAGNOSIS — F3162 Bipolar disorder, current episode mixed, moderate: Secondary | ICD-10-CM | POA: Diagnosis not present

## 2016-02-11 DIAGNOSIS — F319 Bipolar disorder, unspecified: Secondary | ICD-10-CM

## 2016-02-11 MED ORDER — SERTRALINE HCL 100 MG PO TABS: 100.0000 mg | ORAL_TABLET | Freq: Every day | ORAL | Status: DC

## 2016-02-11 MED ORDER — LAMOTRIGINE 150 MG PO TABS: ORAL_TABLET | ORAL | Status: DC

## 2016-02-11 MED ORDER — LURASIDONE HCL 40 MG PO TABS: 40.0000 mg | ORAL_TABLET | Freq: Every day | ORAL | Status: DC

## 2016-02-11 NOTE — Progress Notes (Signed)
[place] [phone] Progress Note  Morrison Mcbryar [id number] 40 y.o.  02/11/2016 2:30 PM  Chief Complaint:  I have neck surgery and I'm doing much better.  I'm very happy and I got my job back.  I'm taking her medication on time.  History of Present Illness:  Reymundo came for his follow-up appointment.  He had neck surgery in February and since then he is seen much improvement in his chronic pain.  He is also very excited because he was able to get his previous job back .  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  He is taking his medication as prescribed.  He is very excited as he is able to get his daughter custody .  He is still dealing with assault charges by his ex-girlfriend and his next court date is April 28.  He is very hopeful that decision will come in his favor.  Overall he described his mood is much better.  He sleeping good.  He is no longer taking any narcotic medication.  He is going to NA on a regular basis.  He described his mood stable.  He denies any irritability, anger, paranoia, hallucination or any crying spells.  His energy level is good.  His appetite is okay.  He wants to continue Lamictal, Zoloft and trazodone.  He still takes occasionally Vistaril.  Patient denies drinking or using any illegal substances.  His appetite is okay.  He has mild tachycardia but denies any chest pain or any palpitation.    Suicidal Ideation: No Plan Formed: No Patient has means to carry out plan: No  Homicidal Ideation: No Plan Formed: No Patient has means to carry out plan: No  Past Psychiatric History/Hospitalization(s) Patient has multiple psychiatric hospital admission. His last psychiatric hospitalization was January 11th at [place].  Earlier than that he was admitted in September 2016 at [facility]  he has at least 2 psychiatric hospitalization in  2015. He has a history of heavy drinking, using cocaine and drugs.  In the past he has  hospitalization at [facility].  He has history of suicidal thinking and overdose on Klonopin.  He has history of paranoia, anger , substance use , mood swings anger and irritability.  He had tried Depakote, BuSpar, Risperdal, Prozac and Seroquel.  He endorsed increased prolactin level which could be due to Risperdal .  He has history of using Xanax, Valium and Klonopin.   Anxiety: Yes Bipolar Disorder: Yes Depression: Yes Mania: Yes Psychosis: Yes Schizophrenia: No Personality Disorder: No Hospitalization for psychiatric illness: Yes History of Electroconvulsive Shock Therapy: No Prior Suicide Attempts: Yes  Medical History; Patient has multiple medical problems.  He has hypertension, diabetes mellitus , asthma, GERD, chronic pain, degenerative disc disease, fibromyalgia, low testosterone, hyperlipidemia and allergic rhinitis.  His primary care physician is Dr. Conley Canal and he see Dr. Dwyane Dee for her diabetes.  Review of Systems  Constitutional: Negative.  Negative for weight loss.  Cardiovascular: Negative for chest pain and palpitations.  Gastrointestinal: Negative.   Skin: Negative for itching and rash.  Neurological: Negative for tremors.  Psychiatric/Behavioral: Negative for hallucinations.     Psychiatric: Agitation: No Hallucination: No Depressed Mood: No Insomnia: Yes Hypersomnia: No Altered Concentration: No Feels Worthless: No Grandiose Ideas: No Belief In Special Powers: No New/Increased Substance Abuse: No Compulsions: No  Neurologic: Headache: No Seizure: No Paresthesias: No    Outpatient Encounter Prescriptions as of 02/11/2016  Medication Sig  .  cetirizine (ZYRTEC) 10 MG tablet Take 1 tablet (10 mg total) by mouth daily. For allergies  . cyclobenzaprine (FLEXERIL) 10 MG tablet Take 1 tablet (10 mg total) by mouth 3 (three) times daily as needed for muscle spasms. Muscle spasm  . Fluticasone Furoate-Vilanterol (BREO ELLIPTA) 100-25 MCG/INH AEPB  Inhale 1 puff into the lungs daily. For shortness of breath  . gabapentin (NEURONTIN) 800 MG tablet Take 1 tablet (800 mg total) by mouth 3 (three) times daily. For agitation  . hydrOXYzine (ATARAX/VISTARIL) 50 MG tablet Take 2 tablets (100 mg) at bedtime  . insulin aspart (NOVOLOG) 100 UNIT/ML injection Use max 58 units with V-go pump: For diabetes management (Patient not taking: Reported on 12/06/2015)  . Insulin Disposable Pump (V-GO 20) KIT USE AS DIRECTED: For diabetes management  . insulin glargine (LANTUS) 100 UNIT/ML injection Inject 0.2 mLs (20 Units total) into the skin at bedtime. For diabetes management (Patient not taking: Reported on 12/06/2015)  . insulin lispro (HUMALOG) 100 UNIT/ML injection Inject 58 Units into the skin continuous. V-go pump  . lamoTRIgine (LAMICTAL) 150 MG tablet TAKE 2 TABLETS EVERY DAY FOR MOOD STABILIZATION  . lurasidone (LATUDA) 40 MG TABS tablet Take 1 tablet (40 mg total) by mouth daily with breakfast. For mood control  . mirabegron ER (MYRBETRIQ) 50 MG TB24 tablet Take 1 tablet (50 mg total) by mouth daily. For bladder control (Patient taking differently: Take 50 mg by mouth daily before breakfast. For bladder control)  . pioglitazone (ACTOS) 30 MG tablet TAKE 1 TABLET (30 MG TOTAL) BY MOUTH DAILY: For Diabetes managment  . ramipril (ALTACE) 2.5 MG capsule Take 1 capsule (2.5 mg total) by mouth daily. For high blood pressure  . sertraline (ZOLOFT) 100 MG tablet Take 1 tablet (100 mg total) by mouth daily.  . simvastatin (ZOCOR) 10 MG tablet Take 1 tablet (10 mg total) by mouth at bedtime. For high cholesterol  . tiZANidine (ZANAFLEX) 2 MG tablet Take 2 mg by mouth 3 (three) times daily as needed.  . traZODone (DESYREL) 150 MG tablet TAKE 1 TABLET AT BEDTIME  . [DISCONTINUED] lamoTRIgine (LAMICTAL) 150 MG tablet TAKE 2 TABLETS EVERY DAY FOR MOOD STABILIZATION  . [DISCONTINUED] lurasidone (LATUDA) 40 MG TABS tablet Take 1 tablet (40 mg total) by mouth daily  with breakfast. For mood control  . [DISCONTINUED] oxyCODONE-acetaminophen (PERCOCET/ROXICET) 5-325 MG tablet Take 1-2 tablets by mouth every 4 (four) hours as needed for moderate pain or severe pain.  . [DISCONTINUED] sertraline (ZOLOFT) 100 MG tablet TAKE 1 TABLET EVERY DAY   No facility-administered encounter medications on file as of 02/11/2016.    Recent Results (from the past 2160 hour(s))  POC CBG, ED     Status: Abnormal   Collection Time: 11/15/15  4:54 PM  Result Value Ref Range   Glucose-Capillary 154 (H) 65 - 99 mg/dL  Urine rapid drug screen (hosp performed)     Status: None   Collection Time: 11/15/15  5:07 PM  Result Value Ref Range   Opiates NONE DETECTED NONE DETECTED   Cocaine NONE DETECTED NONE DETECTED   Benzodiazepines NONE DETECTED NONE DETECTED   Amphetamines NONE DETECTED NONE DETECTED   Tetrahydrocannabinol NONE DETECTED NONE DETECTED   Barbiturates NONE DETECTED NONE DETECTED    Comment:        DRUG SCREEN FOR MEDICAL PURPOSES ONLY.  IF CONFIRMATION IS NEEDED FOR ANY PURPOSE, NOTIFY LAB WITHIN 5 DAYS.        LOWEST DETECTABLE LIMITS FOR URINE DRUG  SCREEN Drug Class       Cutoff (ng/mL) Amphetamine      1000 Barbiturate      200 Benzodiazepine   016 Tricyclics       553 Opiates          300 Cocaine          300 THC              50   Urinalysis, Routine w reflex microscopic (not at [facility])     Status: None   Collection Time: 11/15/15  5:07 PM  Result Value Ref Range   Color, Urine YELLOW YELLOW   APPearance CLEAR CLEAR   Specific Gravity, Urine 1.006 1.005 - 1.030   pH 6.5 5.0 - 8.0   Glucose, UA NEGATIVE NEGATIVE mg/dL   Hgb urine dipstick NEGATIVE NEGATIVE   Bilirubin Urine NEGATIVE NEGATIVE   Ketones, ur NEGATIVE NEGATIVE mg/dL   Protein, ur NEGATIVE NEGATIVE mg/dL   Nitrite NEGATIVE NEGATIVE   Leukocytes, UA NEGATIVE NEGATIVE    Comment: MICROSCOPIC NOT DONE ON URINES WITH NEGATIVE PROTEIN, BLOOD, LEUKOCYTES, NITRITE, OR GLUCOSE <1000 mg/dL.   Ethanol     Status: None   Collection Time: 11/15/15  5:41 PM  Result Value Ref Range   Alcohol, Ethyl (B) <5 <5 mg/dL    Comment:        LOWEST DETECTABLE LIMIT FOR SERUM ALCOHOL IS 5 mg/dL FOR MEDICAL PURPOSES ONLY   CBC with Differential/Platelet     Status: None   Collection Time: 11/15/15  5:41 PM  Result Value Ref Range   WBC 9.6 4.0 - 10.5 K/uL   RBC 4.74 4.22 - 5.81 MIL/uL   Hemoglobin 14.2 13.0 - 17.0 g/dL   HCT 43.4 39.0 - 52.0 %   MCV 91.6 78.0 - 100.0 fL   MCH 30.0 26.0 - 34.0 pg   MCHC 32.7 30.0 - 36.0 g/dL   RDW 14.6 11.5 - 15.5 %   Platelets 282 150 - 400 K/uL   Neutrophils Relative % 75 %   Neutro Abs 7.3 1.7 - 7.7 K/uL   Lymphocytes Relative 15 %   Lymphs Abs 1.4 0.7 - 4.0 K/uL   Monocytes Relative 9 %   Monocytes Absolute 0.8 0.1 - 1.0 K/uL   Eosinophils Relative 1 %   Eosinophils Absolute 0.1 0.0 - 0.7 K/uL   Basophils Relative 0 %   Basophils Absolute 0.0 0.0 - 0.1 K/uL  Comprehensive metabolic panel     Status: Abnormal   Collection Time: 11/15/15  5:41 PM  Result Value Ref Range   Sodium 134 (L) 135 - 145 mmol/L   Potassium 4.3 3.5 - 5.1 mmol/L   Chloride 96 (L) 101 - 111 mmol/L   CO2 27 22 - 32 mmol/L   Glucose, Bld 164 (H) 65 - 99 mg/dL   BUN 8 6 - 20 mg/dL   Creatinine, Ser 0.86 0.61 - 1.24 mg/dL   Calcium 9.5 8.9 - 10.3 mg/dL   Total Protein 7.4 6.5 - 8.1 g/dL   Albumin 4.6 3.5 - 5.0 g/dL   AST 20 15 - 41 U/L   ALT 20 17 - 63 U/L   Alkaline Phosphatase 90 38 - 126 U/L   Total Bilirubin 0.8 0.3 - 1.2 mg/dL   GFR calc non Af Amer >60 >60 mL/min   GFR calc Af Amer >60 >60 mL/min    Comment: (NOTE) The eGFR has been calculated using the CKD EPI equation. This calculation has  not been validated in all clinical situations. eGFR's persistently <60 mL/min signify possible Chronic Kidney Disease.    Anion gap 11 5 - 15  Salicylate level     Status: None   Collection Time: 11/15/15  5:41 PM  Result Value Ref Range   Salicylate Lvl <1.7 2.8  - 30.0 mg/dL  Acetaminophen level     Status: Abnormal   Collection Time: 11/15/15  5:41 PM  Result Value Ref Range   Acetaminophen (Tylenol), Serum <10 (L) 10 - 30 ug/mL    Comment:        THERAPEUTIC CONCENTRATIONS VARY SIGNIFICANTLY. A RANGE OF 10-30 ug/mL MAY BE AN EFFECTIVE CONCENTRATION FOR MANY PATIENTS. HOWEVER, SOME ARE BEST TREATED AT CONCENTRATIONS OUTSIDE THIS RANGE. ACETAMINOPHEN CONCENTRATIONS >150 ug/mL AT 4 HOURS AFTER INGESTION AND >50 ug/mL AT 12 HOURS AFTER INGESTION ARE OFTEN ASSOCIATED WITH TOXIC REACTIONS.   Glucose, capillary     Status: Abnormal   Collection Time: 11/15/15  9:35 PM  Result Value Ref Range   Glucose-Capillary 215 (H) 65 - 99 mg/dL  Glucose, capillary     Status: Abnormal   Collection Time: 11/16/15  6:19 AM  Result Value Ref Range   Glucose-Capillary 239 (H) 65 - 99 mg/dL  Glucose, capillary     Status: Abnormal   Collection Time: 11/16/15 11:36 AM  Result Value Ref Range   Glucose-Capillary 259 (H) 65 - 99 mg/dL  Glucose, capillary     Status: Abnormal   Collection Time: 11/16/15  4:38 PM  Result Value Ref Range   Glucose-Capillary 281 (H) 65 - 99 mg/dL  Glucose, capillary     Status: Abnormal   Collection Time: 11/16/15  8:53 PM  Result Value Ref Range   Glucose-Capillary 185 (H) 65 - 99 mg/dL  Glucose, capillary     Status: Abnormal   Collection Time: 11/17/15  6:26 AM  Result Value Ref Range   Glucose-Capillary 189 (H) 65 - 99 mg/dL  Glucose, capillary     Status: Abnormal   Collection Time: 11/17/15 11:56 AM  Result Value Ref Range   Glucose-Capillary 325 (H) 65 - 99 mg/dL   Comment 1 Notify RN    Comment 2 Document in Chart   Glucose, capillary     Status: Abnormal   Collection Time: 11/17/15  5:18 PM  Result Value Ref Range   Glucose-Capillary 251 (H) 65 - 99 mg/dL   Comment 1 Notify RN    Comment 2 Document in Chart   Glucose, capillary     Status: Abnormal   Collection Time: 11/17/15  9:00 PM  Result Value Ref  Range   Glucose-Capillary 309 (H) 65 - 99 mg/dL   Comment 1 Notify RN    Comment 2 Document in Chart   Glucose, capillary     Status: Abnormal   Collection Time: 11/18/15  6:17 AM  Result Value Ref Range   Glucose-Capillary 284 (H) 65 - 99 mg/dL   Comment 1 Notify RN    Comment 2 Document in Chart   Hemoglobin A1c     Status: Abnormal   Collection Time: 11/18/15  6:29 AM  Result Value Ref Range   Hgb A1c MFr Bld 8.3 (H) 4.8 - 5.6 %    Comment: (NOTE)         Pre-diabetes: 5.7 - 6.4         Diabetes: >6.4         Glycemic control for adults with diabetes: <7.0  Mean Plasma Glucose 192 mg/dL    Comment: (NOTE) Performed At: [facility] [place], [place] [id number] Lindon Romp MD [id number] Performed at [facility]   Lipid panel     Status: None   Collection Time: 11/18/15  6:29 AM  Result Value Ref Range   Cholesterol 151 0 - 200 mg/dL   Triglycerides 93 <150 mg/dL   HDL 47 >40 mg/dL   Total CHOL/HDL Ratio 3.2 RATIO   VLDL 19 0 - 40 mg/dL   LDL Cholesterol 85 0 - 99 mg/dL    Comment:        Total Cholesterol/HDL:CHD Risk Coronary Heart Disease Risk Table                     Men   Women  1/2 Average Risk   3.4   3.3  Average Risk       5.0   4.4  2 X Average Risk   9.6   7.1  3 X Average Risk  23.4   11.0        Use the calculated Patient Ratio above and the CHD Risk Table to determine the patient's CHD Risk.        ATP III CLASSIFICATION (LDL):  <100     mg/dL   Optimal  100-129  mg/dL   Near or Above                    Optimal  130-159  mg/dL   Borderline  160-189  mg/dL   High  >190     mg/dL   Very High Performed at [facility]   Glucose, capillary     Status: Abnormal   Collection Time: 11/18/15 11:54 AM  Result Value Ref Range   Glucose-Capillary 332 (H) 65 - 99 mg/dL   Comment 1 Notify RN    Comment 2 Document in Chart   Glucose, capillary     Status: Abnormal   Collection Time: 11/18/15   5:00 PM  Result Value Ref Range   Glucose-Capillary 158 (H) 65 - 99 mg/dL   Comment 1 Notify RN    Comment 2 Document in Chart   Glucose, capillary     Status: Abnormal   Collection Time: 11/18/15  8:36 PM  Result Value Ref Range   Glucose-Capillary 286 (H) 65 - 99 mg/dL  Glucose, capillary     Status: Abnormal   Collection Time: 11/19/15  5:47 AM  Result Value Ref Range   Glucose-Capillary 290 (H) 65 - 99 mg/dL   Comment 1 Notify RN   Glucose, capillary     Status: Abnormal   Collection Time: 11/19/15 11:34 AM  Result Value Ref Range   Glucose-Capillary 352 (H) 65 - 99 mg/dL  Glucose, capillary     Status: Abnormal   Collection Time: 11/19/15  4:22 PM  Result Value Ref Range   Glucose-Capillary 425 (H) 65 - 99 mg/dL   Comment 1 Notify RN   Glucose, capillary     Status: Abnormal   Collection Time: 11/19/15  5:29 PM  Result Value Ref Range   Glucose-Capillary 395 (H) 65 - 99 mg/dL  Glucose, capillary     Status: Abnormal   Collection Time: 11/19/15  8:40 PM  Result Value Ref Range   Glucose-Capillary 176 (H) 65 - 99 mg/dL  Glucose, capillary     Status: Abnormal   Collection Time: 11/20/15  6:01 AM  Result Value Ref Range   Glucose-Capillary 287 (H) 65 - 99 mg/dL  Glucose, capillary     Status: Abnormal   Collection Time: 11/20/15  9:54 AM  Result Value Ref Range   Glucose-Capillary 426 (H) 65 - 99 mg/dL   Comment 1 Notify RN    Comment 2 Document in Chart   Glucose, capillary     Status: Abnormal   Collection Time: 11/20/15 11:55 AM  Result Value Ref Range   Glucose-Capillary 259 (H) 65 - 99 mg/dL  Glucose, capillary     Status: Abnormal   Collection Time: 11/20/15  5:09 PM  Result Value Ref Range   Glucose-Capillary 315 (H) 65 - 99 mg/dL  Glucose, capillary     Status: Abnormal   Collection Time: 11/20/15  9:28 PM  Result Value Ref Range   Glucose-Capillary 410 (H) 65 - 99 mg/dL  Glucose, capillary     Status: Abnormal   Collection Time: 11/21/15  6:06 AM   Result Value Ref Range   Glucose-Capillary 326 (H) 65 - 99 mg/dL  Glucose, capillary     Status: Abnormal   Collection Time: 11/21/15 11:41 AM  Result Value Ref Range   Glucose-Capillary 264 (H) 65 - 99 mg/dL  Glucose, capillary     Status: Abnormal   Collection Time: 12/07/15  1:38 PM  Result Value Ref Range   Glucose-Capillary 258 (H) 65 - 99 mg/dL  Basic metabolic panel     Status: Abnormal   Collection Time: 12/07/15  3:10 PM  Result Value Ref Range   Sodium 134 (L) 135 - 145 mmol/L   Potassium 4.4 3.5 - 5.1 mmol/L   Chloride 96 (L) 101 - 111 mmol/L   CO2 29 22 - 32 mmol/L   Glucose, Bld 198 (H) 65 - 99 mg/dL   BUN 7 6 - 20 mg/dL   Creatinine, Ser 0.87 0.61 - 1.24 mg/dL   Calcium 9.7 8.9 - 10.3 mg/dL   GFR calc non Af Amer >60 >60 mL/min   GFR calc Af Amer >60 >60 mL/min    Comment: (NOTE) The eGFR has been calculated using the CKD EPI equation. This calculation has not been validated in all clinical situations. eGFR's persistently <60 mL/min signify possible Chronic Kidney Disease.    Anion gap 9 5 - 15  Protime-INR     Status: None   Collection Time: 12/07/15  3:10 PM  Result Value Ref Range   Prothrombin Time 13.3 11.6 - 15.2 seconds   INR 0.99 0.00 - 1.49  Surgical pcr screen     Status: None   Collection Time: 12/07/15  3:26 PM  Result Value Ref Range   MRSA, PCR NEGATIVE NEGATIVE   Staphylococcus aureus NEGATIVE NEGATIVE    Comment:        The Xpert SA Assay (FDA approved for NASAL specimens in patients over 41 years of age), is one component of a comprehensive surveillance program.  Test performance has been validated by [facility] for patients greater than or equal to 29 year old. It is not intended to diagnose infection nor to guide or monitor treatment.   Glucose, capillary     Status: Abnormal   Collection Time: 12/21/15 10:59 AM  Result Value Ref Range   Glucose-Capillary 269 (H) 65 - 99 mg/dL  CBC     Status: Abnormal   Collection Time:  12/21/15 11:17 AM  Result Value Ref Range   WBC 11.2 (H) 4.0 - 10.5 K/uL  RBC 5.16 4.22 - 5.81 MIL/uL   Hemoglobin 15.4 13.0 - 17.0 g/dL   HCT 45.0 39.0 - 52.0 %   MCV 87.2 78.0 - 100.0 fL   MCH 29.8 26.0 - 34.0 pg   MCHC 34.2 30.0 - 36.0 g/dL   RDW 13.7 11.5 - 15.5 %   Platelets 253 150 - 400 K/uL  Glucose, capillary     Status: Abnormal   Collection Time: 12/21/15  1:07 PM  Result Value Ref Range   Glucose-Capillary 222 (H) 65 - 99 mg/dL  Glucose, capillary     Status: Abnormal   Collection Time: 12/21/15  3:14 PM  Result Value Ref Range   Glucose-Capillary 125 (H) 65 - 99 mg/dL  Glucose, capillary     Status: Abnormal   Collection Time: 12/21/15  5:11 PM  Result Value Ref Range   Glucose-Capillary 129 (H) 65 - 99 mg/dL  Glucose, capillary     Status: Abnormal   Collection Time: 12/21/15  9:46 PM  Result Value Ref Range   Glucose-Capillary 221 (H) 65 - 99 mg/dL   Comment 1 Notify RN    Comment 2 Document in Chart   Glucose, capillary     Status: Abnormal   Collection Time: 12/22/15  7:58 AM  Result Value Ref Range   Glucose-Capillary 262 (H) 65 - 99 mg/dL      Physical Exam: Constitutional:  BP 120/85 mmHg  Pulse 111  Ht '6\' 1"'  (1.854 m)  Wt 199 lb 12.8 oz (90.629 kg)  BMI 26.37 kg/m2  Musculoskeletal: Strength & Muscle Tone: within normal limits Gait & Station: normal Patient leans: N/A  Mental Status Examination;  Patient is casually dressed and groomed.  He is cooperative and maintained good eye contact.  He described his mood Euthymic and his affect is improved from the past.  His speech is clear, slow and coherent.  He denies any auditory or visual hallucination.  He denies any active or passive suicidal thoughts or homicidal thought.  His psychomotor activity is normal.  His attention and concentration is fair.  There were no delusions, paranoia or any obsessive thoughts.  His thought process logical and goal-directed. His cognition is okay.  He has no  tremors or shakes.  His insight judgment and impulse control is fair.  Established Problem, Stable/Improving (1), Review of Last Therapy Session (1) and Review of Medication Regimen & Side Effects (2)  Assessment: Axis I: Bipolar disorder NOS, rule out posttraumatic stress disorder  Axis II: Deferred  Axis III:  Past Medical History  Diagnosis Date  . Allergy   . Depression   . GERD (gastroesophageal reflux disease)   . Hyperlipidemia   . Chronic pain disorder     Sees [facility]  . Urinary incontinence     detrusor instability  . Hypogonadism   . Diabetes mellitus     Type II  . Chronic neck pain   . Bipolar depression ([facility] [place] [facility])   . Opiate dependence, continuous ([place])   . Polysubstance abuse   . Drug-seeking behavior   . Hypertension   . Pneumonia     11/2014- tx at [place]   . Arthritis     cervical- disc disease    Plan:  Patient is a stable on his current psychiatric medication.  He is no longer taking any narcotic pain medication.  Continue Zoloft 100 mg daily, trazodone 150 mg at bedtime, Lamictal 300 mg in the morning and Latuda 40 mg at  bedtime.  He has no side effects including any tremors, shakes, EPS, rash or itching.  Encouraged to continue meeting with N/A recommended to call us back if he is any question or any concern.  Follow-up in 3 months. Discuss safety plan that anytime having active suicidal thoughts or homicidal thoughts then patient need to call 911 or go to the local emergency room.   Georges Victorio T., MD 02/11/2016

## 2016-02-15 ENCOUNTER — Telehealth: Payer: Self-pay | Admitting: Endocrinology

## 2016-02-15 NOTE — Telephone Encounter (Signed)
Pt is dismissed but the vgo pump is not covering it anymore, please advise if Dr. Lucianne MussKumar can do this

## 2016-02-18 ENCOUNTER — Other Ambulatory Visit (HOSPITAL_COMMUNITY): Payer: Self-pay | Admitting: Psychiatry

## 2016-02-18 NOTE — Telephone Encounter (Signed)
No, he needs to find another Dr. To take care of his Diabetes.

## 2016-02-21 ENCOUNTER — Telehealth (HOSPITAL_COMMUNITY): Payer: Self-pay

## 2016-02-21 ENCOUNTER — Other Ambulatory Visit (HOSPITAL_COMMUNITY): Payer: Self-pay | Admitting: Psychiatry

## 2016-02-21 DIAGNOSIS — F3162 Bipolar disorder, current episode mixed, moderate: Secondary | ICD-10-CM

## 2016-02-21 MED ORDER — HYDROXYZINE HCL 50 MG PO TABS: ORAL_TABLET | ORAL | Status: DC

## 2016-02-21 NOTE — Telephone Encounter (Signed)
Fax received from patients pharmacy. They are asking for a refill on his Hydroxyzine - I noticed that you did not refill it on his visit 4/3, and it was not mentioned in your note. Is patient still taking this medication? If so please advise on the refill, thank you

## 2016-02-21 NOTE — Telephone Encounter (Signed)
Sent to the pharmacy by Dr. Lolly MustacheArfeen.

## 2016-02-24 IMAGING — DX DG SHOULDER 2+V*L*
3 series · 3 of 3 positions shown · non-contrast
Comparison: None.

CLINICAL DATA: Left shoulder pain after injury. Pain while pulling
on a lawnmower.

EXAM:
LEFT SHOULDER - 2+ VIEW

[shoulder grashey]
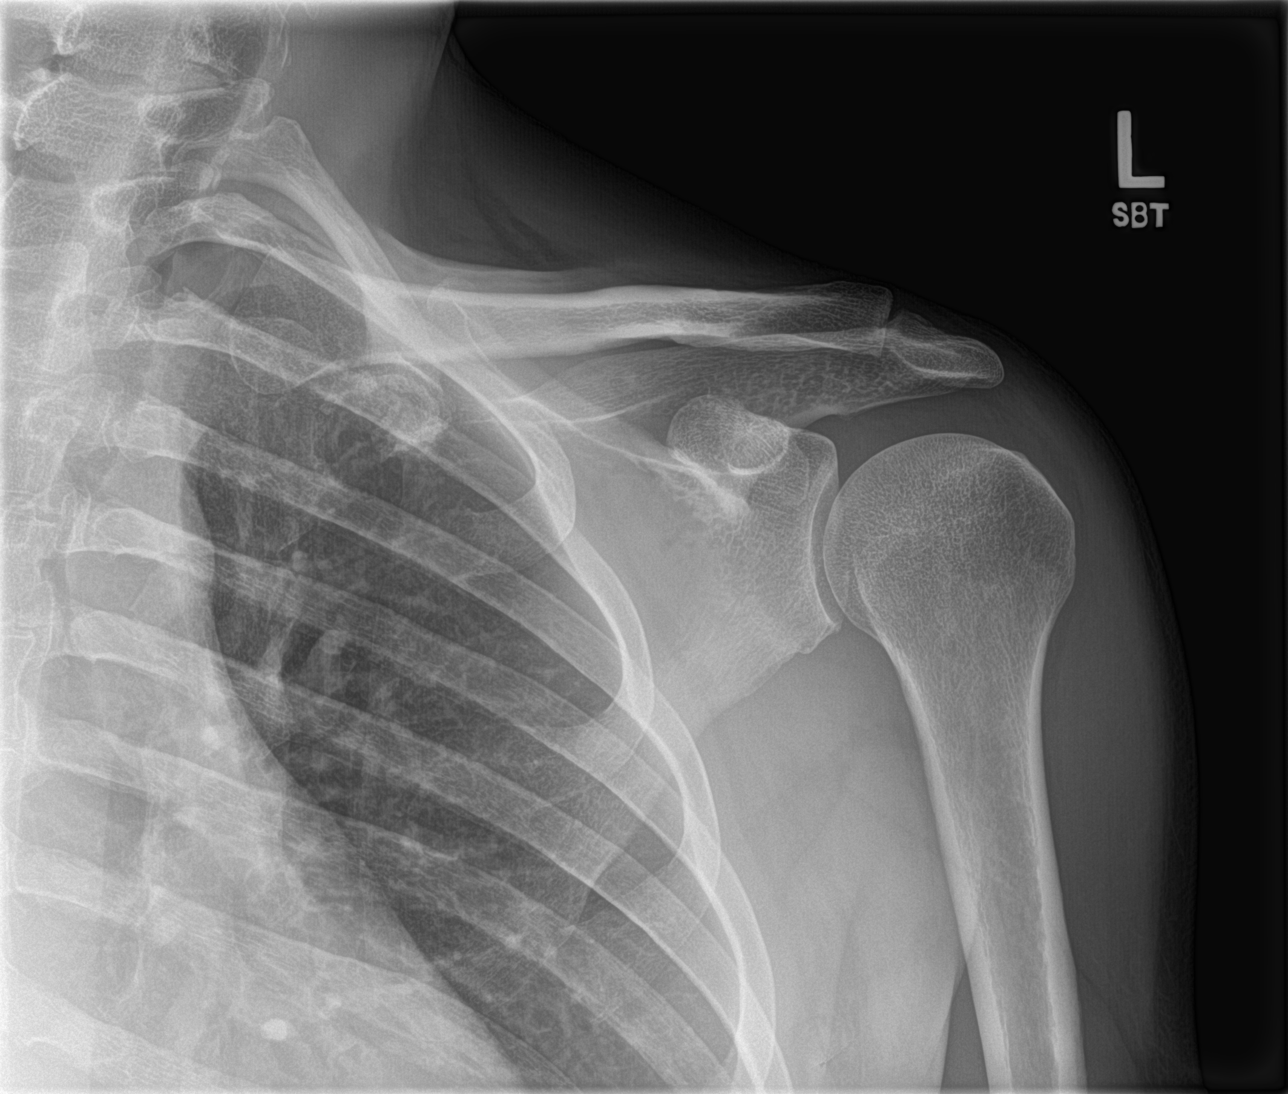

[shoulder y view]
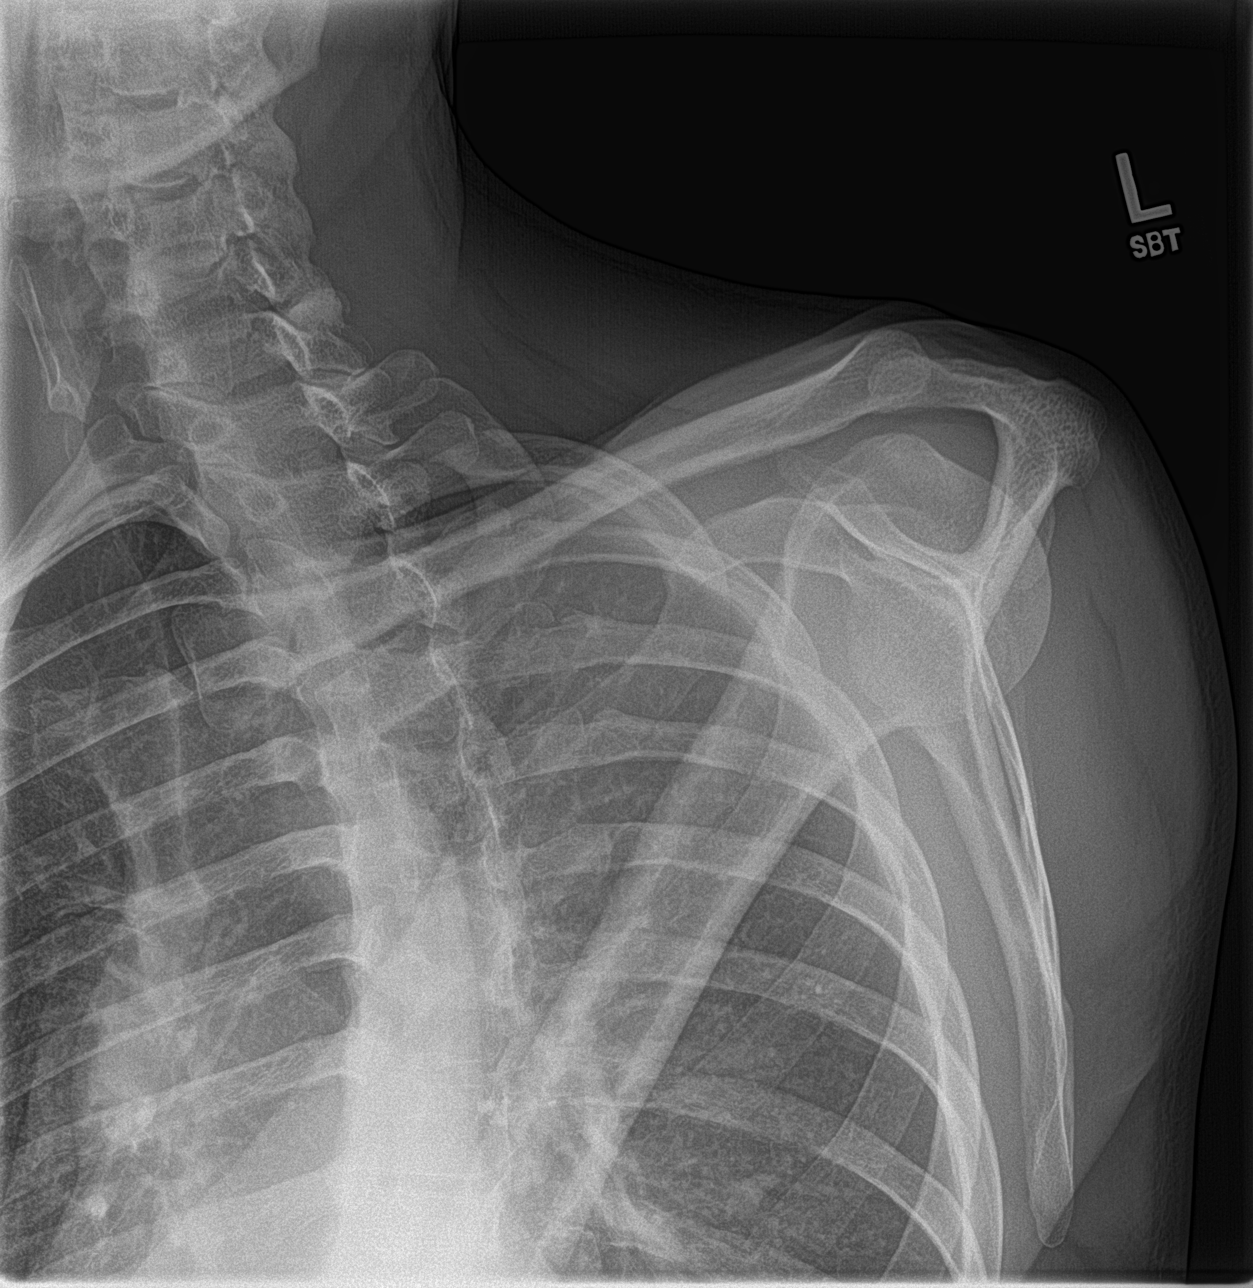

[shoulder axillary]
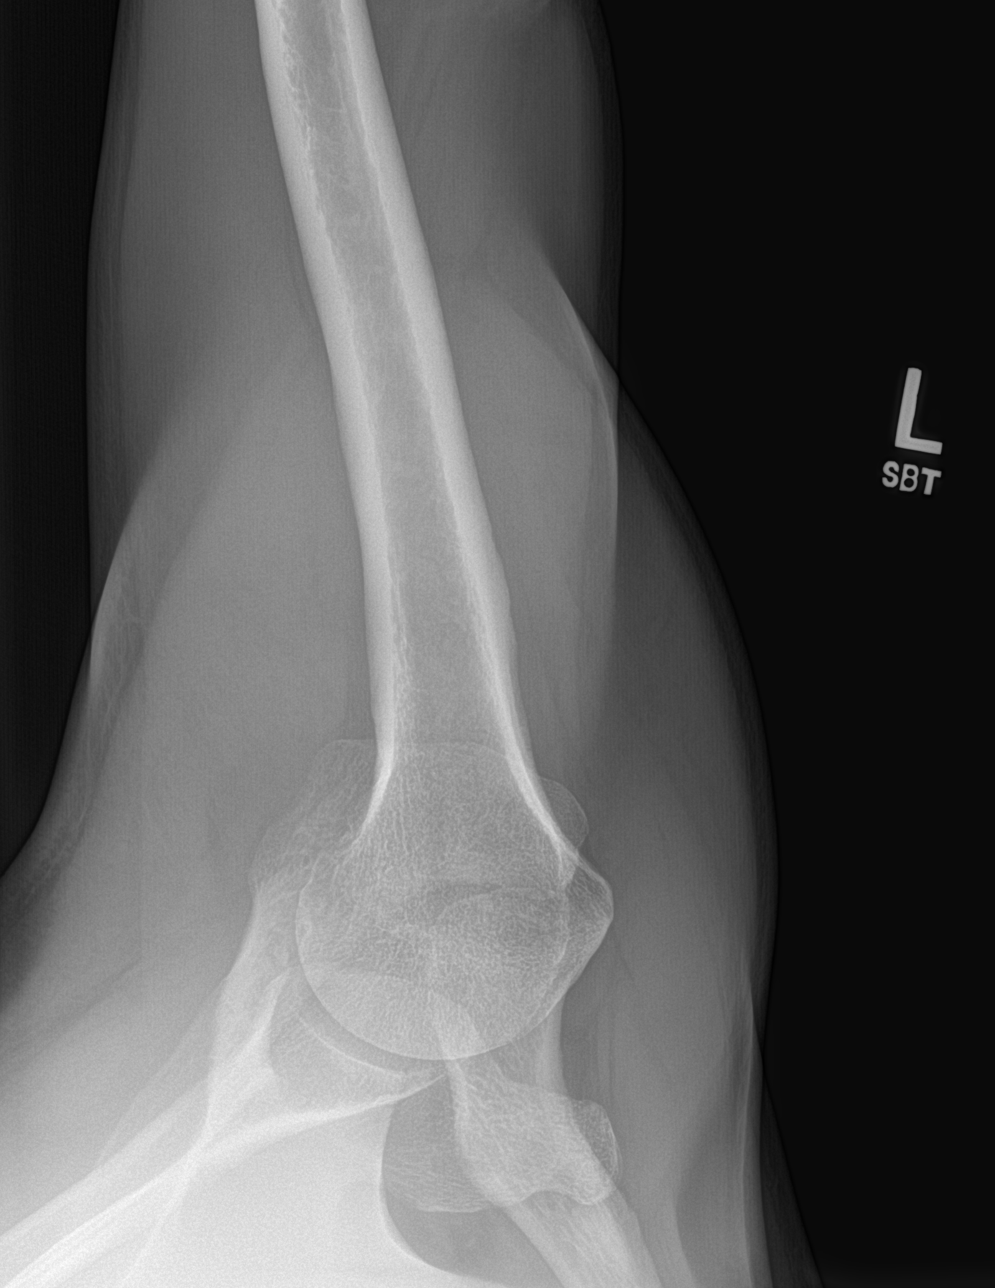

[3 of 3 positions shown; findings below may reference images not displayed]

FINDINGS: No fracture or dislocation. The alignment and joint spaces are
maintained. No soft tissue calcification or focal soft tissue
abnormality.
IMPRESSION: No fracture or dislocation of the left shoulder.

## 2016-03-01 ENCOUNTER — Other Ambulatory Visit: Payer: Self-pay | Admitting: Endocrinology

## 2016-03-24 IMAGING — CT CT HEAD W/O CM
1 series · 16 of 30 positions shown, 20 images · non-contrast
Comparison: Prior head CT 01/07/2015

CLINICAL DATA: 38-year-old male with severe headache, nausea,
vomiting, weakness and photophobia

EXAM:
CT HEAD WITHOUT CONTRAST
TECHNIQUE: Contiguous axial images were obtained from the base of the skull
through the vertex without intravenous contrast.

[Series 2: head 4.8 h37s · axial · 0.49mm/px · z∈[-127,+10]mm · 16 of 32 slices shown, 20 images]
[im 2/32  brain]
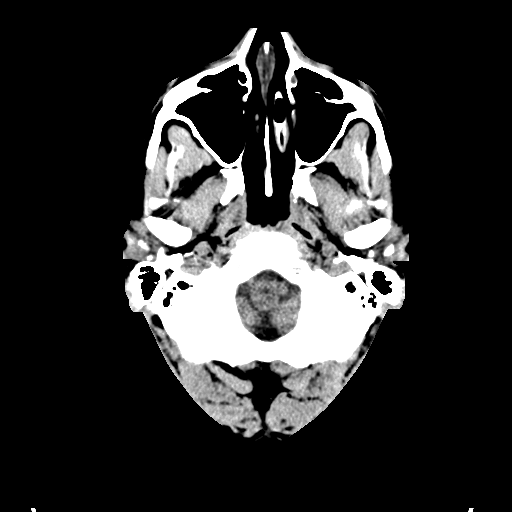
[im 2/32  bone]
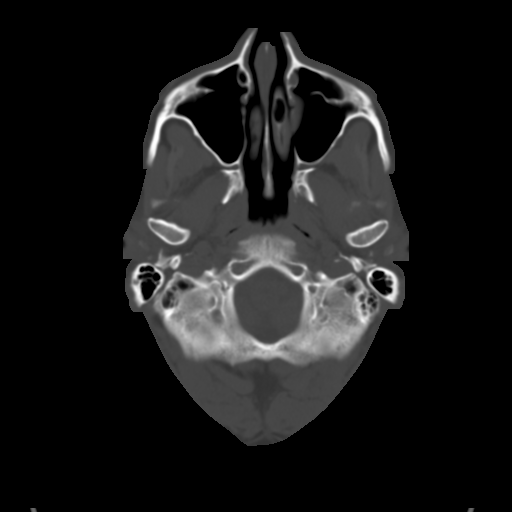
[im 4/32  brain]
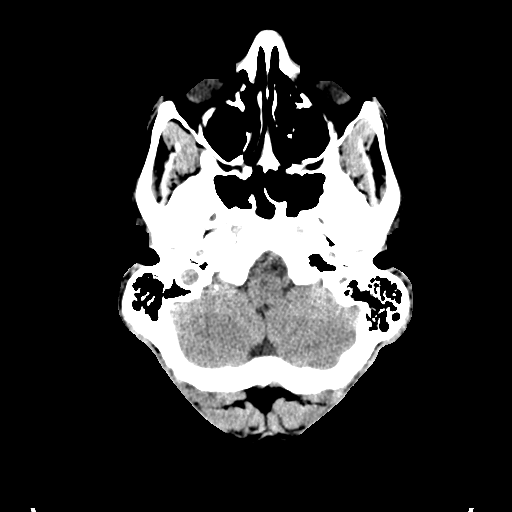
[im 6/32  brain]
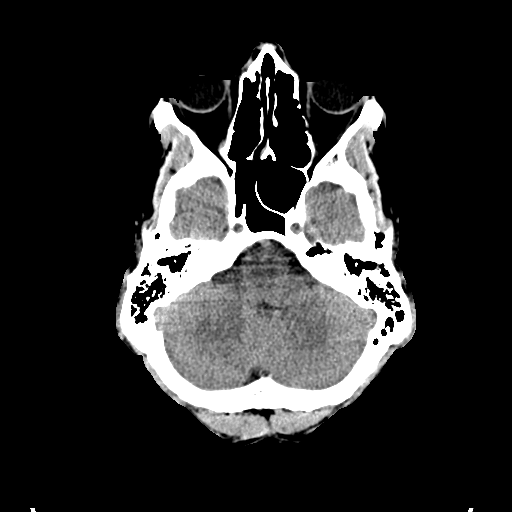
[im 8/32  brain]
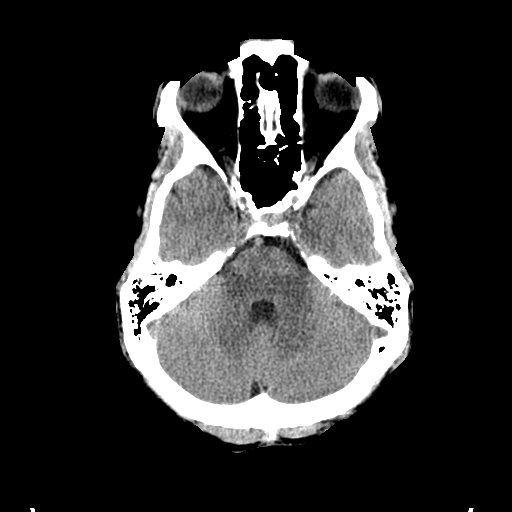
[im 9/32  brain]
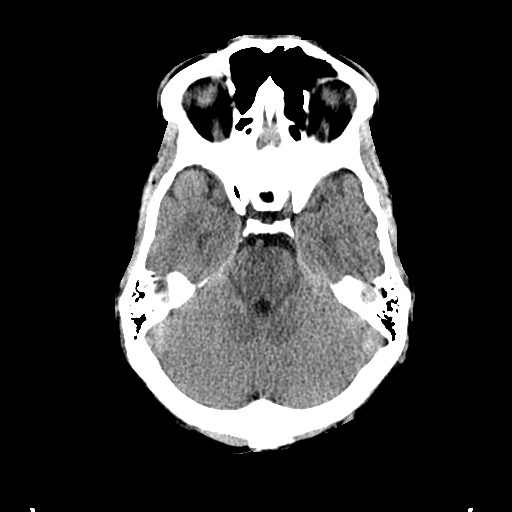
[im 9/32  bone]
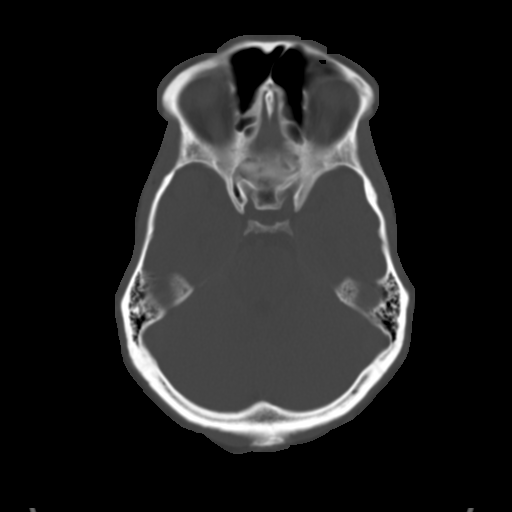
[im 11/32  brain]
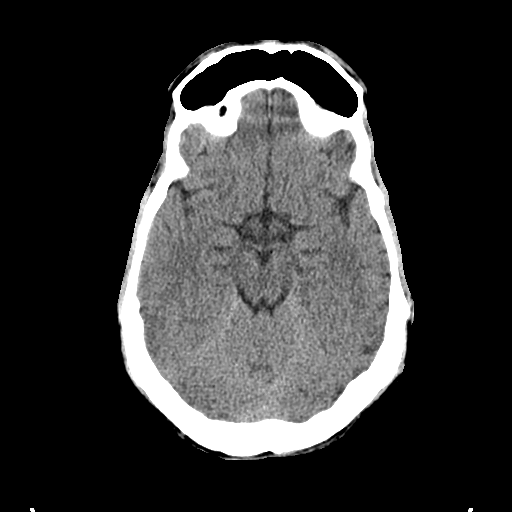
[im 13/32  brain]
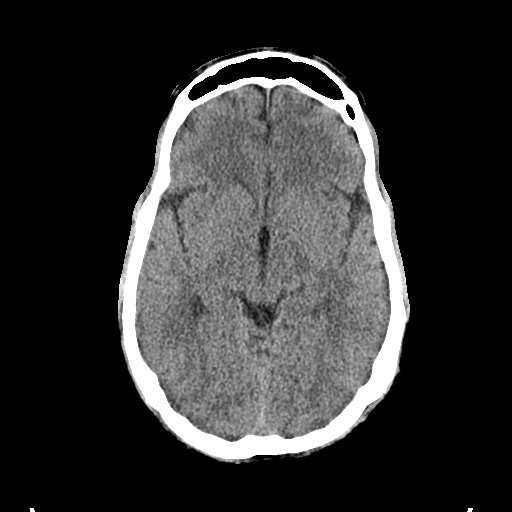
[im 15/32  brain]
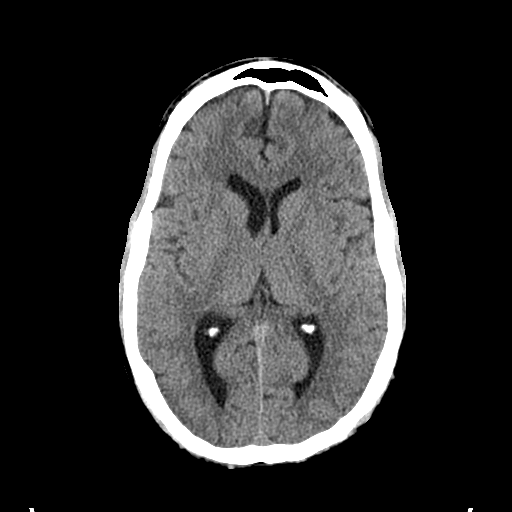
[im 17/32  brain]
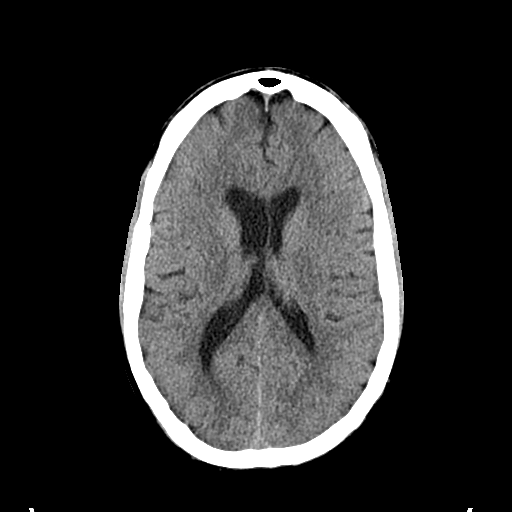
[im 17/32  bone]
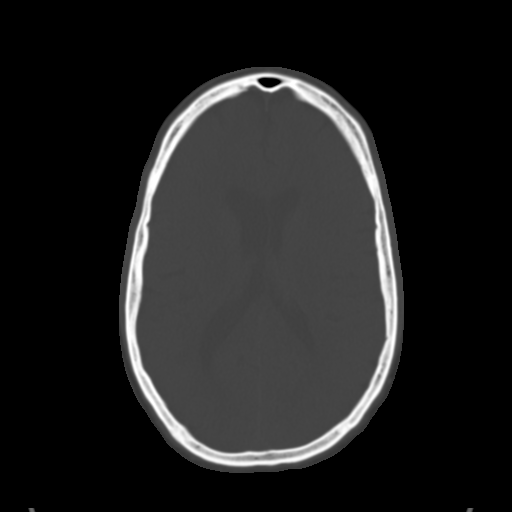
[im 19/32  brain]
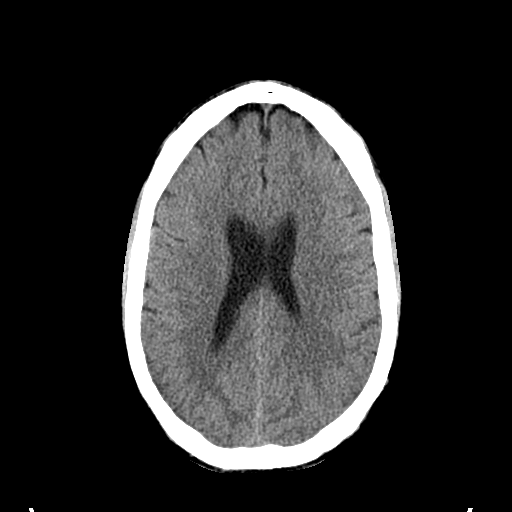
[im 21/32  brain]
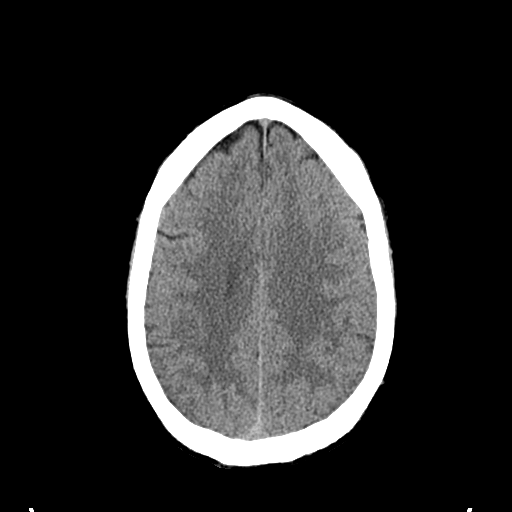
[im 23/32  brain]
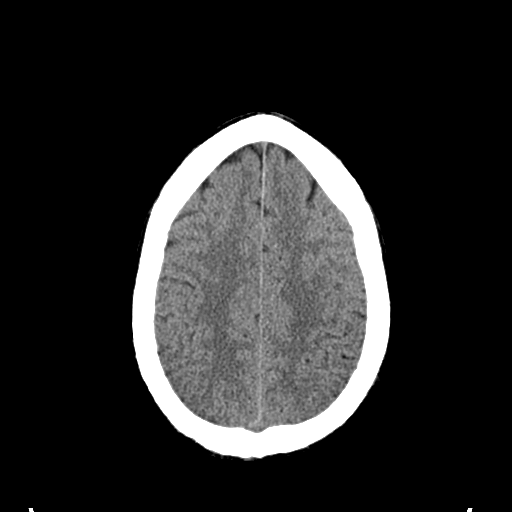
[im 24/32  brain]
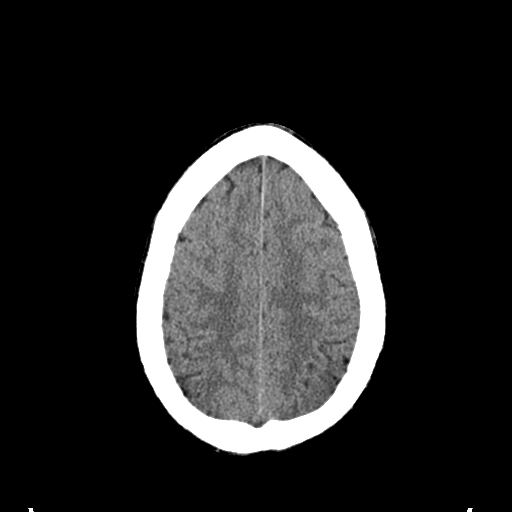
[im 24/32  bone]
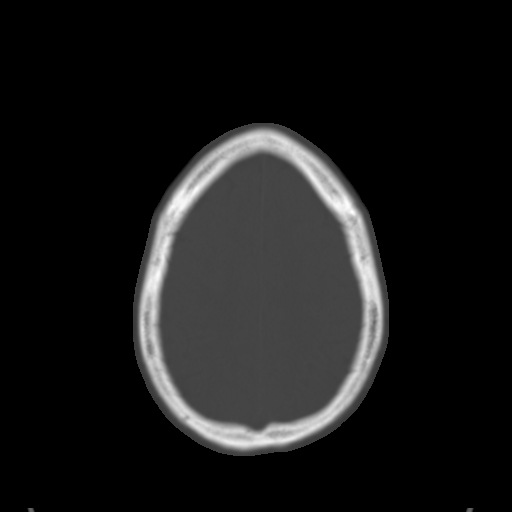
[im 26/32  brain]
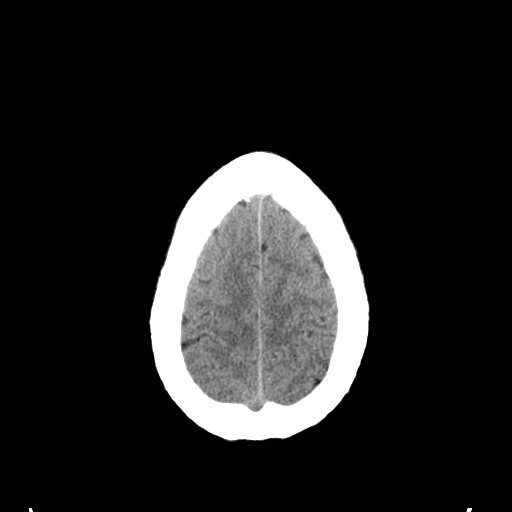
[im 28/32  brain]
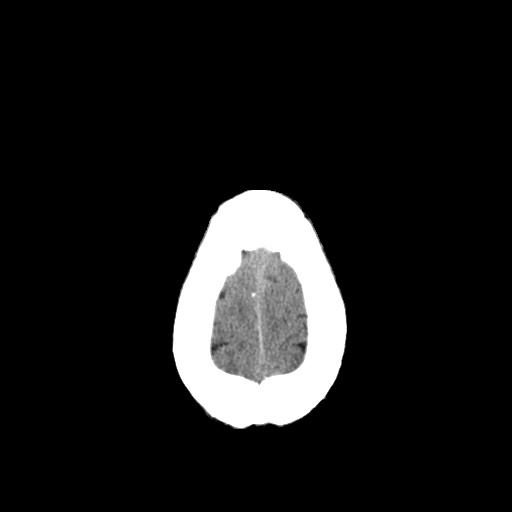
[im 30/32  brain]
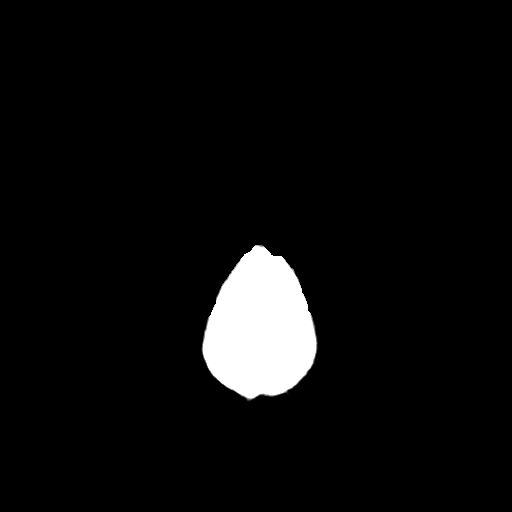

[16 of 30 positions shown; findings below may reference images not displayed]

FINDINGS: Negative for acute intracranial hemorrhage, acute infarction, mass,
mass effect, hydrocephalus or midline shift. Gray-white
differentiation is preserved throughout. No acute soft tissue or
calvarial abnormality. The globes and orbits are symmetric and
unremarkable. Normal aeration of the mastoid air cells and
visualized paranasal sinuses.
IMPRESSION: Negative head CT.

## 2016-03-25 ENCOUNTER — Telehealth (HOSPITAL_COMMUNITY): Payer: Self-pay

## 2016-03-25 ENCOUNTER — Other Ambulatory Visit (HOSPITAL_COMMUNITY): Payer: Self-pay | Admitting: Psychiatry

## 2016-03-25 DIAGNOSIS — F3162 Bipolar disorder, current episode mixed, moderate: Secondary | ICD-10-CM

## 2016-03-25 MED ORDER — TRAZODONE HCL 150 MG PO TABS: 150.0000 mg | ORAL_TABLET | Freq: Every day | ORAL | Status: DC

## 2016-03-25 NOTE — Telephone Encounter (Signed)
Fax received for a refill on patients trazodone, he was last here on  4/3 and will return on 7/7 - Please review and advise, thank you

## 2016-03-25 NOTE — Telephone Encounter (Signed)
Sent to patient's pharmacy.

## 2016-03-25 NOTE — Telephone Encounter (Signed)
Okay to refill? 

## 2016-05-12 ENCOUNTER — Other Ambulatory Visit: Payer: Self-pay | Admitting: Emergency Medicine

## 2016-05-12 MED ORDER — FLUTICASONE FUROATE-VILANTEROL 100-25 MCG/INH IN AEPB: 1.0000 | INHALATION_SPRAY | Freq: Every day | RESPIRATORY_TRACT | Status: DC

## 2016-05-16 ENCOUNTER — Ambulatory Visit (HOSPITAL_COMMUNITY): Payer: Self-pay | Admitting: Psychiatry

## 2016-05-17 ENCOUNTER — Other Ambulatory Visit (HOSPITAL_COMMUNITY): Payer: Self-pay | Admitting: Psychiatry

## 2016-05-20 ENCOUNTER — Other Ambulatory Visit (HOSPITAL_COMMUNITY): Payer: Self-pay

## 2016-05-20 DIAGNOSIS — F3162 Bipolar disorder, current episode mixed, moderate: Secondary | ICD-10-CM

## 2016-05-20 DIAGNOSIS — F319 Bipolar disorder, unspecified: Secondary | ICD-10-CM

## 2016-05-20 MED ORDER — SERTRALINE HCL 100 MG PO TABS: 100.0000 mg | ORAL_TABLET | Freq: Every day | ORAL | Status: DC

## 2016-05-20 MED ORDER — HYDROXYZINE HCL 50 MG PO TABS: ORAL_TABLET | ORAL | Status: DC

## 2016-05-20 MED ORDER — TRAZODONE HCL 150 MG PO TABS: 150.0000 mg | ORAL_TABLET | Freq: Every day | ORAL | Status: DC

## 2016-05-20 MED ORDER — LURASIDONE HCL 40 MG PO TABS: 40.0000 mg | ORAL_TABLET | Freq: Every day | ORAL | Status: DC

## 2016-05-21 ENCOUNTER — Other Ambulatory Visit (HOSPITAL_COMMUNITY): Payer: Self-pay | Admitting: Psychiatry

## 2016-05-26 ENCOUNTER — Encounter (HOSPITAL_COMMUNITY): Payer: Self-pay

## 2016-06-06 ENCOUNTER — Other Ambulatory Visit: Payer: Self-pay | Admitting: Medical

## 2016-06-19 ENCOUNTER — Other Ambulatory Visit (HOSPITAL_COMMUNITY): Payer: Self-pay | Admitting: Psychiatry

## 2016-06-19 DIAGNOSIS — F3162 Bipolar disorder, current episode mixed, moderate: Secondary | ICD-10-CM

## 2016-06-20 ENCOUNTER — Ambulatory Visit (HOSPITAL_COMMUNITY): Payer: Self-pay | Admitting: Psychiatry

## 2016-06-26 ENCOUNTER — Other Ambulatory Visit (HOSPITAL_COMMUNITY): Payer: Self-pay

## 2016-07-20 ENCOUNTER — Other Ambulatory Visit: Payer: Self-pay | Admitting: Medical

## 2016-07-28 ENCOUNTER — Other Ambulatory Visit (HOSPITAL_COMMUNITY): Payer: Self-pay | Admitting: Psychiatry

## 2016-07-28 DIAGNOSIS — F3162 Bipolar disorder, current episode mixed, moderate: Secondary | ICD-10-CM

## 2016-07-28 DIAGNOSIS — F319 Bipolar disorder, unspecified: Secondary | ICD-10-CM

## 2016-07-30 ENCOUNTER — Other Ambulatory Visit (HOSPITAL_COMMUNITY): Payer: Self-pay

## 2016-07-30 DIAGNOSIS — F3162 Bipolar disorder, current episode mixed, moderate: Secondary | ICD-10-CM

## 2016-07-30 DIAGNOSIS — F319 Bipolar disorder, unspecified: Secondary | ICD-10-CM

## 2016-07-30 MED ORDER — SERTRALINE HCL 100 MG PO TABS: 100.0000 mg | ORAL_TABLET | Freq: Every day | ORAL | 0 refills | Status: DC

## 2016-07-30 MED ORDER — LURASIDONE HCL 40 MG PO TABS: 40.0000 mg | ORAL_TABLET | Freq: Every day | ORAL | 0 refills | Status: DC

## 2016-07-30 NOTE — Progress Notes (Signed)
Patient called for a refill on Latuda and Zoloft. Refills were appropriate and per protocol a one month order was sent to pharmacy. Patient has a follow up next month.

## 2016-08-07 ENCOUNTER — Other Ambulatory Visit (HOSPITAL_COMMUNITY): Payer: Self-pay | Admitting: Psychiatry

## 2016-08-07 DIAGNOSIS — F3162 Bipolar disorder, current episode mixed, moderate: Secondary | ICD-10-CM

## 2016-08-07 NOTE — Telephone Encounter (Signed)
Met with Dr. Adele Schilder who approved a one time refill of patient's Trazodone.  New order e-scribed to patient's [facility] in [place].

## 2016-08-10 ENCOUNTER — Other Ambulatory Visit: Payer: Self-pay | Admitting: Medical

## 2016-08-15 ENCOUNTER — Ambulatory Visit (HOSPITAL_COMMUNITY): Payer: Self-pay | Admitting: Psychiatry

## 2016-08-16 ENCOUNTER — Other Ambulatory Visit (HOSPITAL_COMMUNITY): Payer: Self-pay | Admitting: Psychiatry

## 2016-08-29 ENCOUNTER — Other Ambulatory Visit (HOSPITAL_COMMUNITY): Payer: Self-pay | Admitting: Psychiatry

## 2016-08-29 DIAGNOSIS — F3162 Bipolar disorder, current episode mixed, moderate: Secondary | ICD-10-CM

## 2016-08-31 ENCOUNTER — Other Ambulatory Visit (HOSPITAL_COMMUNITY): Payer: Self-pay | Admitting: Psychiatry

## 2016-08-31 DIAGNOSIS — F319 Bipolar disorder, unspecified: Secondary | ICD-10-CM

## 2016-09-02 ENCOUNTER — Other Ambulatory Visit (HOSPITAL_COMMUNITY): Payer: Self-pay

## 2016-09-02 DIAGNOSIS — F319 Bipolar disorder, unspecified: Secondary | ICD-10-CM

## 2016-09-02 MED ORDER — LURASIDONE HCL 40 MG PO TABS: 40.0000 mg | ORAL_TABLET | Freq: Every day | ORAL | 0 refills | Status: DC

## 2016-09-02 NOTE — Progress Notes (Signed)
Patient called and stated he is out of Latuda, patient has an appointment at the end of the month, I sent in a 30 day supply per protocol.

## 2016-09-09 ENCOUNTER — Ambulatory Visit (HOSPITAL_COMMUNITY): Payer: Self-pay | Admitting: Psychiatry

## 2016-09-18 ENCOUNTER — Other Ambulatory Visit (HOSPITAL_COMMUNITY): Payer: Self-pay | Admitting: Psychiatry

## 2016-09-18 DIAGNOSIS — F3162 Bipolar disorder, current episode mixed, moderate: Secondary | ICD-10-CM

## 2016-09-26 ENCOUNTER — Other Ambulatory Visit (HOSPITAL_COMMUNITY): Payer: Self-pay | Admitting: Psychiatry

## 2016-09-26 DIAGNOSIS — F3162 Bipolar disorder, current episode mixed, moderate: Secondary | ICD-10-CM

## 2016-10-11 ENCOUNTER — Other Ambulatory Visit (HOSPITAL_COMMUNITY): Payer: Self-pay | Admitting: Psychiatry

## 2016-10-11 DIAGNOSIS — F319 Bipolar disorder, unspecified: Secondary | ICD-10-CM

## 2016-10-14 ENCOUNTER — Other Ambulatory Visit: Payer: Self-pay | Admitting: Medical

## 2016-10-17 ENCOUNTER — Other Ambulatory Visit (HOSPITAL_COMMUNITY): Payer: Self-pay | Admitting: Psychiatry

## 2016-10-17 DIAGNOSIS — F3162 Bipolar disorder, current episode mixed, moderate: Secondary | ICD-10-CM

## 2016-10-29 ENCOUNTER — Other Ambulatory Visit (HOSPITAL_COMMUNITY): Payer: Self-pay | Admitting: Psychiatry

## 2016-10-30 ENCOUNTER — Other Ambulatory Visit (HOSPITAL_COMMUNITY): Payer: Self-pay | Admitting: Psychiatry

## 2016-10-30 MED ORDER — LAMOTRIGINE 150 MG PO TABS: ORAL_TABLET | ORAL | 0 refills | Status: DC

## 2016-11-22 ENCOUNTER — Other Ambulatory Visit (HOSPITAL_COMMUNITY): Payer: Self-pay | Admitting: Psychiatry

## 2016-11-22 DIAGNOSIS — F3162 Bipolar disorder, current episode mixed, moderate: Secondary | ICD-10-CM

## 2016-11-26 ENCOUNTER — Other Ambulatory Visit (HOSPITAL_COMMUNITY): Payer: Self-pay | Admitting: Psychiatry

## 2016-11-26 DIAGNOSIS — F3162 Bipolar disorder, current episode mixed, moderate: Secondary | ICD-10-CM

## 2016-11-27 ENCOUNTER — Ambulatory Visit (HOSPITAL_COMMUNITY): Payer: Self-pay | Admitting: Psychiatry

## 2016-11-28 ENCOUNTER — Other Ambulatory Visit (HOSPITAL_COMMUNITY): Payer: Self-pay

## 2016-11-28 DIAGNOSIS — F3162 Bipolar disorder, current episode mixed, moderate: Secondary | ICD-10-CM

## 2016-11-28 MED ORDER — SERTRALINE HCL 100 MG PO TABS: 100.0000 mg | ORAL_TABLET | Freq: Every day | ORAL | 0 refills | Status: DC

## 2016-11-28 MED ORDER — TRAZODONE HCL 150 MG PO TABS: 150.0000 mg | ORAL_TABLET | Freq: Every day | ORAL | 0 refills | Status: DC

## 2016-11-28 MED ORDER — LAMOTRIGINE 150 MG PO TABS: ORAL_TABLET | ORAL | 0 refills | Status: DC

## 2016-12-02 ENCOUNTER — Ambulatory Visit (INDEPENDENT_AMBULATORY_CARE_PROVIDER_SITE_OTHER): Payer: 59 | Admitting: Psychiatry

## 2016-12-02 ENCOUNTER — Encounter (HOSPITAL_COMMUNITY): Payer: Self-pay | Admitting: Psychiatry

## 2016-12-02 VITALS — BP 110/64 | HR 67 | Ht 73.0 in | Wt 168.0 lb

## 2016-12-02 DIAGNOSIS — Z794 Long term (current) use of insulin: Secondary | ICD-10-CM

## 2016-12-02 DIAGNOSIS — F3162 Bipolar disorder, current episode mixed, moderate: Secondary | ICD-10-CM

## 2016-12-02 DIAGNOSIS — Z833 Family history of diabetes mellitus: Secondary | ICD-10-CM | POA: Diagnosis not present

## 2016-12-02 DIAGNOSIS — Z801 Family history of malignant neoplasm of trachea, bronchus and lung: Secondary | ICD-10-CM

## 2016-12-02 DIAGNOSIS — Z79899 Other long term (current) drug therapy: Secondary | ICD-10-CM

## 2016-12-02 DIAGNOSIS — F17210 Nicotine dependence, cigarettes, uncomplicated: Secondary | ICD-10-CM

## 2016-12-02 DIAGNOSIS — Z8261 Family history of arthritis: Secondary | ICD-10-CM | POA: Diagnosis not present

## 2016-12-02 DIAGNOSIS — Z9889 Other specified postprocedural states: Secondary | ICD-10-CM

## 2016-12-02 DIAGNOSIS — Z8249 Family history of ischemic heart disease and other diseases of the circulatory system: Secondary | ICD-10-CM

## 2016-12-02 DIAGNOSIS — F319 Bipolar disorder, unspecified: Secondary | ICD-10-CM

## 2016-12-02 DIAGNOSIS — Z888 Allergy status to other drugs, medicaments and biological substances status: Secondary | ICD-10-CM

## 2016-12-02 MED ORDER — SERTRALINE HCL 100 MG PO TABS: 100.0000 mg | ORAL_TABLET | Freq: Every day | ORAL | 2 refills | Status: DC

## 2016-12-02 MED ORDER — LAMOTRIGINE 150 MG PO TABS: ORAL_TABLET | ORAL | 2 refills | Status: DC

## 2016-12-02 MED ORDER — LURASIDONE HCL 40 MG PO TABS: ORAL_TABLET | ORAL | 2 refills | Status: DC

## 2016-12-02 NOTE — Progress Notes (Signed)
BH MD/PA/NP OP Progress Note  12/02/2016 2:08 PM [name].  MRN:  [id number]  Chief Complaint:  Chief Complaint    Follow-up     Subjective:  I got a new job.  I'm happy.  HPI: [name] came for his follow-up appointment.  He was last seen April 2017.  He has lost 30 pound since then.  Patient told his diabetes has been worsened and he is using insulin pump.  Overall he described his mood is good.  He denies any irritability, anger, mania or any suicidal thoughts.  He sleeping good.  He is no longer taking trazodone, Neurontin, muscle relaxant and cholesterol-lowering medication.  He is also not taking blood pressure medicine because he was told that his cholesterol and blood pressure is a stable.  He is living with his mother.  His 26 year old daughter goes to school near by and he does not want to move some other place.  He feels proud that he's been sober from alcohol for 2 years.  He sleeping good without trazodone.  He denies any self abusive behavior.  He is taking Lamictal, Zoloft and Latuda.  He has no tremors or shakes.  His energy level is good.  His vital signs are stable except that he lost 30 pound since April.  Visit Diagnosis:    ICD-9-CM ICD-10-CM   1. Bipolar 1 disorder (HCC) 296.7 F31.9 sertraline (ZOLOFT) 100 MG tablet     lurasidone (LATUDA) 40 MG TABS tablet     lamoTRIgine (LAMICTAL) 150 MG tablet  2. Bipolar 1 disorder, mixed, moderate (HCC) 296.62 F31.62     Past Psychiatric History: Reviewed Patient has multiple psychiatric hospital admission. His last psychiatric hospitalization was January 11th at [facility].  Earlier than that he was admitted in September 2016 at [facility]  he has at least 2 psychiatric hospitalization in  2015. He has a history of heavy drinking, using cocaine and drugs.  In the past he has hospitalization at [facility].  He has history of suicidal thinking and overdose on Klonopin.  He has history  of paranoia, anger , substance use , mood swings anger and irritability.  He had tried Depakote, BuSpar, Risperdal, Prozac and Seroquel.  He endorsed increased prolactin level which could be due to Risperdal .  He has history of using Xanax, Valium and Klonopin.    Past Medical History:  Past Medical History:  Diagnosis Date  . Allergy   . Anxiety   . Arthritis    cervical- disc disease   . Bipolar depression ([place])   . Chronic neck pain   . Chronic pain disorder    Sees [facility]  . Depression   . Diabetes mellitus    Type II  . Drug-seeking behavior   . GERD (gastroesophageal reflux disease)   . Hyperlipidemia   . Hypertension   . Hypogonadism   . Opiate dependence, continuous ([place])   . Pneumonia    11/2014- tx at [facility]  . Polysubstance abuse   . Urinary incontinence    detrusor instability    Past Surgical History:  Procedure Laterality Date  . ANTERIOR CERVICAL DECOMP/DISCECTOMY FUSION N/A 12/21/2015   Procedure: Cervical four-five, Cervical five-six Anterior Cervical Discectomy Fusion;  Surgeon: Eustace Moore, MD;  Location: [place] [facility];  Service: Neurosurgery;  Laterality: N/A;  . APPENDECTOMY  2002  . CARDIAC CATHETERIZATION  2006   done due to pain in his chest, no blockage seen  .  HERNIA REPAIR  2002   abdominal  . NASAL SINUS SURGERY  2008    Family Psychiatric History: Reviewed.  Family History:  Family History  Problem Relation Age of Onset  . Diabetes Mother   . Hypertension Mother   . Cirrhosis Mother   . Depression Mother   . Bipolar disorder Mother   . Arthritis Father 11    osteoarthritis  . Bipolar disorder Father   . Diabetes Sister     borderline  . Heart disease Paternal Uncle 17    MI  . Heart disease Maternal Grandfather     late 60's--MI  . Cancer Paternal Grandmother 76    lung  . Bipolar disorder Paternal Grandmother   . Heart disease Paternal Grandfather 8    MI  . Bipolar disorder Paternal Grandfather      Social History:  Social History   Social History  . Marital status: Legally Separated    Spouse name: N/A  . Number of children: 2  . Years of education: N/A   Occupational History  . Disabled Unemployed    chronic pain   Social History Main Topics  . Smoking status: Current Every Day Smoker    Packs/day: 1.50    Years: 25.00    Types: Cigarettes  . Smokeless tobacco: Never Used  . Alcohol use No  . Drug use: No  . Sexual activity: Yes    Partners: Female   Other Topics Concern  . None   Social History Narrative  . None    Allergies:  Allergies  Allergen Reactions  . Tramadol Other (See Comments)    SERATONIN SYNDROME DIZZINESS  . Ciprofloxacin Hives    Metabolic Disorder Labs: Lab Results  Component Value Date   HGBA1C 8.3 (H) 11/18/2015   MPG 192 11/18/2015   MPG 220 08/04/2015   No results found for: PROLACTIN Lab Results  Component Value Date   CHOL 151 11/18/2015   TRIG 93 11/18/2015   HDL 47 11/18/2015   CHOLHDL 3.2 11/18/2015   VLDL 19 11/18/2015   LDLCALC 85 11/18/2015   LDLCALC 109 (H) 11/08/2015     Current Medications: Current Outpatient Prescriptions  Medication Sig Dispense Refill  . BREO ELLIPTA 100-25 MCG/INH AEPB INHALE 1 PUFF INTO THE LUNGS DAILY. FOR SHORTNESS OF BREATH 60 each 0  . cetirizine (ZYRTEC) 10 MG tablet Take 1 tablet (10 mg total) by mouth daily. For allergies    . insulin aspart (NOVOLOG) 100 UNIT/ML injection Use max 58 units with V-go pump: For diabetes management 20 mL 2  . Insulin Disposable Pump (V-GO 20) KIT USE AS DIRECTED: For diabetes management 1 kit 2  . insulin lispro (HUMALOG) 100 UNIT/ML injection Inject 58 Units into the skin continuous. V-go pump    . lamoTRIgine (LAMICTAL) 150 MG tablet TAKE 2 TABLETS EVERY DAY FOR MOOD STABILIZATION 60 tablet 2  . lurasidone (LATUDA) 40 MG TABS tablet TAKE 1 TABLET EVERY DAY WITH BREAKFAST FOR MOOD CONTROL 30 tablet 2  . mirabegron ER (MYRBETRIQ) 50 MG TB24  tablet Take 1 tablet (50 mg total) by mouth daily. For bladder control (Patient taking differently: Take 50 mg by mouth daily before breakfast. For bladder control) 30 tablet 0  . sertraline (ZOLOFT) 100 MG tablet Take 1 tablet (100 mg total) by mouth daily. 30 tablet 2  . cyclobenzaprine (FLEXERIL) 10 MG tablet Take 1 tablet (10 mg total) by mouth 3 (three) times daily as needed for muscle spasms. Muscle spasm (Patient  not taking: Reported on 12/02/2016) 60 tablet 1  . insulin glargine (LANTUS) 100 UNIT/ML injection Inject 0.2 mLs (20 Units total) into the skin at bedtime. For diabetes management (Patient not taking: Reported on 12/02/2016) 10 mL 11  . pioglitazone (ACTOS) 30 MG tablet TAKE 1 TABLET (30 MG TOTAL) BY MOUTH DAILY: For Diabetes managment (Patient not taking: Reported on 12/02/2016) 30 tablet 2   No current facility-administered medications for this visit.     Neurologic: Headache: No Seizure: No Paresthesias: No  Musculoskeletal: Strength & Muscle Tone: within normal limits Gait & Station: normal Patient leans: N/A  Psychiatric Specialty Exam: Review of Systems  Constitutional: Positive for weight loss.  HENT: Negative.   Eyes: Negative.   Respiratory: Negative.   Cardiovascular: Negative.   Gastrointestinal: Negative.   Genitourinary: Negative.   Musculoskeletal: Negative.   Skin: Negative.  Negative for itching and rash.  Neurological: Negative.   Psychiatric/Behavioral: Negative.     Blood pressure 110/64, pulse 67, height 6' 1"  (1.854 m), weight 168 lb (76.2 kg).Body mass index is 22.16 kg/m.  General Appearance: Fairly Groomed and beared  Eye Contact:  Good  Speech:  Clear and Coherent  Volume:  Normal  Mood:  Euthymic  Affect:  Appropriate  Thought Process:  Goal Directed  Orientation:  Full (Time, Place, and Person)  Thought Content: WDL and Logical   Suicidal Thoughts:  No  Homicidal Thoughts:  No  Memory:  Immediate;   Good Recent;   Good Remote;    Good  Judgement:  Good  Insight:  Good  Psychomotor Activity:  Normal  Concentration:  Concentration: Good and Attention Span: Good  Recall:  Good  Fund of Knowledge: Good  Language: Good  Akathisia:  No  Handed:  Right  AIMS (if indicated):  0  Assets:  Communication Skills Desire for [place] Talents/Skills  ADL's:  Intact  Cognition: WNL  Sleep:  Good    Assessment: Bipolar disorder type I.  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  He is no longer taking any pain medication, Neurontin, trazodone and also does not require cholesterol-lowering medication and antihypertension medication.  He had lost more than 30 pounds since April.  He is more involved in his treatment and seeing his endocrinologist on a regular basis.  I will continue Lamictal 300 mg daily, Latuda 40 mg at bedtime and Zoloft 100 mg daily.  He has no rash, itching or any side effects.  Discussed medication side effects and benefits.  Recommended to call us back if he has any question or any concern.  Follow-up in 3 months.  Discuss safety plan that anytime having active suicidal thoughts or homicidal thought that he need to call 911 or go to the local emergency room.   Chimamanda Siegfried T., MD 12/02/2016, 2:08 PM

## 2017-01-13 ENCOUNTER — Telehealth (HOSPITAL_COMMUNITY): Payer: Self-pay

## 2017-01-13 NOTE — Telephone Encounter (Signed)
Patient called for a letter - he needs it for his attorney, and he needs it to say when his last inpatient visit was and that he has been compliant on medications and visits since that time - please let me know if it is okay to write letter.

## 2017-01-14 ENCOUNTER — Encounter (HOSPITAL_COMMUNITY): Payer: Self-pay

## 2017-01-14 NOTE — Telephone Encounter (Signed)
Letter was written per Dr. Lolly MustacheArfeen, I called patient to let him know it was ready for pick up.

## 2017-02-27 ENCOUNTER — Ambulatory Visit (HOSPITAL_COMMUNITY): Payer: Self-pay | Admitting: Psychiatry

## 2017-03-08 ENCOUNTER — Other Ambulatory Visit (HOSPITAL_COMMUNITY): Payer: Self-pay | Admitting: Psychiatry

## 2017-03-08 DIAGNOSIS — F319 Bipolar disorder, unspecified: Secondary | ICD-10-CM

## 2017-03-11 ENCOUNTER — Other Ambulatory Visit (HOSPITAL_COMMUNITY): Payer: Self-pay | Admitting: Psychiatry

## 2017-03-11 DIAGNOSIS — F319 Bipolar disorder, unspecified: Secondary | ICD-10-CM

## 2017-03-16 ENCOUNTER — Telehealth (HOSPITAL_COMMUNITY): Payer: Self-pay

## 2017-03-16 DIAGNOSIS — F319 Bipolar disorder, unspecified: Secondary | ICD-10-CM

## 2017-03-16 NOTE — Telephone Encounter (Signed)
Medication management - Telephobne call with patient after he left a message requesting refills of all medications. Informed Dr. Lolly MustacheArfeen was out today but would send message to him as he will be back in the office 03/17/17.   Informed patient he would need to call to reschedule an appointment as currently he does not have one and Dr. Lolly MustacheArfeen will most likely request he reschedule for refills.

## 2017-03-17 ENCOUNTER — Other Ambulatory Visit (HOSPITAL_COMMUNITY): Payer: Self-pay | Admitting: Psychiatry

## 2017-03-17 MED ORDER — LAMOTRIGINE 150 MG PO TABS: ORAL_TABLET | ORAL | 0 refills | Status: DC

## 2017-03-17 MED ORDER — LURASIDONE HCL 40 MG PO TABS: ORAL_TABLET | ORAL | 0 refills | Status: DC

## 2017-03-17 MED ORDER — SERTRALINE HCL 100 MG PO TABS: 100.0000 mg | ORAL_TABLET | Freq: Every day | ORAL | 0 refills | Status: DC

## 2017-03-17 NOTE — Telephone Encounter (Signed)
Medication management - Message left for pt he is now scheduled for Thursday Apr 09, 2017 at 10:30 am and will need to keep that appointment for any further refills. New orders e-scribed to patient's [facility] in [place] per verbal order Dr. Lolly MustacheArfeen for 30 days each with no refills. 30 day orders e-scribed for patient's prescribed Lamictal, Latuda and Zoloft.

## 2017-03-17 NOTE — Telephone Encounter (Signed)
We can provide 30 day supply until patient seen in the office.  Patient has history of missing appointment.  No further future refills unless seen in the office.

## 2017-03-23 ENCOUNTER — Other Ambulatory Visit (HOSPITAL_COMMUNITY): Payer: Self-pay | Admitting: Psychiatry

## 2017-03-23 DIAGNOSIS — F319 Bipolar disorder, unspecified: Secondary | ICD-10-CM

## 2017-04-09 ENCOUNTER — Ambulatory Visit (HOSPITAL_COMMUNITY): Payer: Self-pay | Admitting: Psychiatry

## 2017-04-16 ENCOUNTER — Telehealth (HOSPITAL_COMMUNITY): Payer: Self-pay

## 2017-04-16 NOTE — Telephone Encounter (Signed)
Medication refill request - Call from pharmacist with [facility] requesting a refill of patient's prescribed Zoloft. Informed message would be sent to Dr. Lolly MustacheArfeen to request just enough until pt. keeps appointment set for 04/23/17.

## 2017-04-17 NOTE — Telephone Encounter (Signed)
Pt was notified that no medications would be dispense until his appointment next week. Pt states ok.

## 2017-04-17 NOTE — Telephone Encounter (Signed)
Patient has appointment next week.  We will dispense medication on his appointment.

## 2017-04-23 ENCOUNTER — Ambulatory Visit (INDEPENDENT_AMBULATORY_CARE_PROVIDER_SITE_OTHER): Payer: BLUE CROSS/BLUE SHIELD | Admitting: Psychiatry

## 2017-04-23 ENCOUNTER — Encounter (HOSPITAL_COMMUNITY): Payer: Self-pay | Admitting: Psychiatry

## 2017-04-23 DIAGNOSIS — E1165 Type 2 diabetes mellitus with hyperglycemia: Secondary | ICD-10-CM | POA: Diagnosis not present

## 2017-04-23 DIAGNOSIS — F17210 Nicotine dependence, cigarettes, uncomplicated: Secondary | ICD-10-CM

## 2017-04-23 DIAGNOSIS — F319 Bipolar disorder, unspecified: Secondary | ICD-10-CM

## 2017-04-23 DIAGNOSIS — E441 Mild protein-calorie malnutrition: Secondary | ICD-10-CM | POA: Diagnosis not present

## 2017-04-23 DIAGNOSIS — Z818 Family history of other mental and behavioral disorders: Secondary | ICD-10-CM | POA: Diagnosis not present

## 2017-04-23 DIAGNOSIS — E1169 Type 2 diabetes mellitus with other specified complication: Principal | ICD-10-CM

## 2017-04-23 DIAGNOSIS — F1021 Alcohol dependence, in remission: Secondary | ICD-10-CM | POA: Diagnosis not present

## 2017-04-23 DIAGNOSIS — IMO0002 Reserved for concepts with insufficient information to code with codable children: Secondary | ICD-10-CM

## 2017-04-23 DIAGNOSIS — F1024 Alcohol dependence with alcohol-induced mood disorder: Secondary | ICD-10-CM

## 2017-04-23 MED ORDER — LAMOTRIGINE 150 MG PO TABS: ORAL_TABLET | ORAL | 2 refills | Status: DC

## 2017-04-23 MED ORDER — LURASIDONE HCL 20 MG PO TABS: ORAL_TABLET | ORAL | 2 refills | Status: DC

## 2017-04-23 MED ORDER — SERTRALINE HCL 100 MG PO TABS: 100.0000 mg | ORAL_TABLET | Freq: Every day | ORAL | 2 refills | Status: DC

## 2017-04-23 NOTE — Progress Notes (Signed)
BH MD/PA/NP OP Progress Note  04/23/2017 9:17 AM [name].  MRN:  [id number]  Chief Complaint:  Chief Complaint    Follow-up     Subjective:  I am tired.  Sometime I'm very sleepy and groggy.  HPI: [name] came for his follow-up appointment.  He missed last appointment because of work but he has been taking his medication as prescribed.  He denies any irritability, anger, mania, psychosis or any hallucination.  However he is complaining of feeling tired and sleepy in the morning.  He is taking all his medication and nighttime.  He tried taking in the morning but he could not function.  He is taking Lamictal, Latuda and Zoloft.  He likes his new job which is giving him more money.  He is able to pick and drop his 67 year old daughter who is in early college.  He continues to get Suboxone from other provider.  He used to take medication for cholesterol, blood pressure, muscle relaxant, trazodone which has been discontinued because things are going very well.  He reported since he is not in any dysfunctional family situation he is doing much better.  He really enjoyed spending time with his daughter.  Currently he is not in any relationship.  He has no nightmares or any flashback.  He is not sure why he is sleeping too much.  He has not seen his primary care physician in a while but scheduled to see him in July.  He's on insulin pump but admitted some time does not check his blood sugar.  Patient denies any suicidal thoughts or homicidal thought.  His appetite is okay.  He has no hallucination, paranoia or any anger episodes.  Visit Diagnosis:    ICD-10-CM   1. Uncontrolled type 2 diabetes mellitus with other specified complication, without long-term current use of insulin (HCC)Chronic E11.69    E11.65   2. Bipolar 1 disorder, depressed (HCC)Chronic F31.9   3. Malnutrition of mild degree (HCC)Chronic E44.1   4. Alcohol dependence with alcohol-induced mood disorder (HCC)Chronic F10.24   5.  Bipolar 1 disorder (HCC) F31.9 sertraline (ZOLOFT) 100 MG tablet    lamoTRIgine (LAMICTAL) 150 MG tablet    lurasidone (LATUDA) 20 MG TABS tablet    Past Psychiatric History: Reviewed. Patient has multiple psychiatric hospital admission. His last psychiatric hospitalization was January 2017 at [facility]. Earlier than that he was admitted in September 2016 and then at least 2 more psychiatric hospitalization in 2015.  He has a history of heavy drinking, using cocaine and drugs. He was also hospitalized at [facility]. He has history of suicidal thinking and overdose on Klonopin. He has history of paranoia, anger, substance use , mood swings anger and irritability. He had tried Depakote, BuSpar, Risperdal, Prozac and Seroquel. He reported increased prolactin level which could be due to Risperdal.  He has history of using Xanax, Valium and Klonopin.  He was taking trazodone but due to excessive sleep and feeling tired it was discontinued.  Past Medical History:  Past Medical History:  Diagnosis Date  . Allergy   . Anxiety   . Arthritis    cervical- disc disease   . Bipolar depression ([place])   . Chronic neck pain   . Chronic pain disorder    Sees [facility]  . Depression   . Diabetes mellitus    Type II  . Drug-seeking behavior   . GERD (gastroesophageal reflux disease)   . Hyperlipidemia   .  Hypertension   . Hypogonadism   . Opiate dependence, continuous ([place])   . Pneumonia    11/2014- tx at [facility]  . Polysubstance abuse   . Urinary incontinence    detrusor instability    Past Surgical History:  Procedure Laterality Date  . ANTERIOR CERVICAL DECOMP/DISCECTOMY FUSION N/A 12/21/2015   Procedure: Cervical four-five, Cervical five-six Anterior Cervical Discectomy Fusion;  Surgeon: Eustace Moore, MD;  Location: [place] [facility];  Service: Neurosurgery;  Laterality: N/A;  . APPENDECTOMY  2002  . CARDIAC CATHETERIZATION  2006   done due to pain in  his chest, no blockage seen  . HERNIA REPAIR  2002   abdominal  . NASAL SINUS SURGERY  2008    Family Psychiatric History: Reviewed.  Family History:  Family History  Problem Relation Age of Onset  . Diabetes Mother   . Hypertension Mother   . Cirrhosis Mother   . Depression Mother   . Bipolar disorder Mother   . Arthritis Father 86       osteoarthritis  . Bipolar disorder Father   . Heart disease Paternal Uncle 32       MI  . Heart disease Maternal Grandfather        late 60's--MI  . Cancer Paternal Grandmother 42       lung  . Bipolar disorder Paternal Grandmother   . Heart disease Paternal Grandfather 11       MI  . Bipolar disorder Paternal Grandfather   . Diabetes Sister        borderline    Social History:  Social History   Social History  . Marital status: Legally Separated    Spouse name: N/A  . Number of children: 2  . Years of education: N/A   Occupational History  . Disabled Unemployed    chronic pain   Social History Main Topics  . Smoking status: Current Every Day Smoker    Packs/day: 1.50    Years: 25.00    Types: Cigarettes  . Smokeless tobacco: Never Used  . Alcohol use No  . Drug use: No  . Sexual activity: Not Currently    Partners: Female   Other Topics Concern  . None   Social History Narrative  . None    Allergies:  Allergies  Allergen Reactions  . Tramadol Other (See Comments)    SERATONIN SYNDROME DIZZINESS  . Ciprofloxacin Hives    Metabolic Disorder Labs: Lab Results  Component Value Date   HGBA1C 8.3 (H) 11/18/2015   MPG 192 11/18/2015   MPG 220 08/04/2015   No results found for: PROLACTIN Lab Results  Component Value Date   CHOL 151 11/18/2015   TRIG 93 11/18/2015   HDL 47 11/18/2015   CHOLHDL 3.2 11/18/2015   VLDL 19 11/18/2015   LDLCALC 85 11/18/2015   LDLCALC 109 (H) 11/08/2015     Current Medications: Current Outpatient Prescriptions  Medication Sig Dispense Refill  . cetirizine (ZYRTEC) 10  MG tablet Take 1 tablet (10 mg total) by mouth daily. For allergies    . insulin aspart (NOVOLOG) 100 UNIT/ML injection Use max 58 units with V-go pump: For diabetes management 20 mL 2  . Insulin Disposable Pump (V-GO 20) KIT USE AS DIRECTED: For diabetes management 1 kit 2  . insulin glargine (LANTUS) 100 UNIT/ML injection Inject 0.2 mLs (20 Units total) into the skin at bedtime. For diabetes management 10 mL 11  . lamoTRIgine (LAMICTAL) 150 MG tablet TAKE 2 TABLETS EVERY  DAY FOR MOOD STABILIZATION 60 tablet 2  . lurasidone (LATUDA) 20 MG TABS tablet TAKE 1 TABLET EVERY DAY WITH BREAKFAST FOR MOOD CONTROL 20 tablet 2  . mirabegron ER (MYRBETRIQ) 50 MG TB24 tablet Take 1 tablet (50 mg total) by mouth daily. For bladder control (Patient taking differently: Take 50 mg by mouth daily before breakfast. For bladder control) 30 tablet 0  . sertraline (ZOLOFT) 100 MG tablet Take 1 tablet (100 mg total) by mouth daily. 30 tablet 2  . BREO ELLIPTA 100-25 MCG/INH AEPB INHALE 1 PUFF INTO THE LUNGS DAILY. FOR SHORTNESS OF BREATH (Patient not taking: Reported on 04/23/2017) 60 each 0  . cyclobenzaprine (FLEXERIL) 10 MG tablet Take 1 tablet (10 mg total) by mouth 3 (three) times daily as needed for muscle spasms. Muscle spasm (Patient not taking: Reported on 12/02/2016) 60 tablet 1  . insulin lispro (HUMALOG) 100 UNIT/ML injection Inject 58 Units into the skin continuous. V-go pump    . pioglitazone (ACTOS) 30 MG tablet TAKE 1 TABLET (30 MG TOTAL) BY MOUTH DAILY: For Diabetes managment (Patient not taking: Reported on 12/02/2016) 30 tablet 2   No current facility-administered medications for this visit.     Neurologic: Headache: No Seizure: No Paresthesias: No  Musculoskeletal: Strength & Muscle Tone: within normal limits Gait & Station: normal Patient leans: N/A  Psychiatric Specialty Exam: Review of Systems  Constitutional:       Tired  HENT: Negative.   Respiratory: Negative.   Cardiovascular:  Negative.   Gastrointestinal: Negative.   Musculoskeletal: Negative.   Skin: Negative.  Negative for itching and rash.  Neurological: Negative.   Endo/Heme/Allergies: Negative.     Blood pressure 110/74, pulse 88, height 6' 1" (1.854 m), weight 170 lb (77.1 kg), SpO2 99 %.Body mass index is 22.43 kg/m.  General Appearance: Casual  Eye Contact:  Good  Speech:  Slow  Volume:  Normal  Mood:  Tired  Affect:  Appropriate and Congruent  Thought Process:  Goal Directed  Orientation:  Full (Time, Place, and Person)  Thought Content: WDL and Logical   Suicidal Thoughts:  No  Homicidal Thoughts:  No  Memory:  Immediate;   Good Recent;   Good Remote;   Good  Judgement:  Good  Insight:  Good  Psychomotor Activity:  Normal  Concentration:  Concentration: Good and Attention Span: Good  Recall:  Good  Fund of Knowledge: Good  Language: Good  Akathisia:  No  Handed:  Right  AIMS (if indicated):  0  Assets:  Communication Skills Desire for [place] Talents/Skills Transportation  ADL's:  Intact  Cognition: WNL  Sleep:  Hypersomnia     Assessment:  Bipolar disorder type I.  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  Plan: I review his symptoms, current medication and collateral information from other providers.  He is seeing an endocrinologist and his last hemoglobin A1c was 7.5 which was done in November.  I recommended to have his appointment with his physician for blood work since he has not done in a while.  Recommended to try reducing Latuda 20 mg to help excessive sedation and if it is contributing to feeling tired.  Continue Lamictal and 50 mg daily and Zoloft 100 mg daily.  I recommended not to take all his medication at night and try to take Lamictal in the morning.  Recommended to keep appointment with the providers.  Patient has no tremors shakes or any EPS.  Discussed medication side effects and benefits.  Discuss safety risk that anytime  having active suicidal thoughts or homicidal thoughts and he need to call 911 or go to the local emergency room.  Follow-up in 3 months. Time spent 25 minutes.  More than 50% of the time spent in psychoeducation, counseling and coordination of care.  Discuss safety plan that anytime having active suicidal thoughts or homicidal thoughts then patient need to call 911 or go to the local emergency room.    Timothy Kissel T., MD 04/23/2017, 9:17 AM

## 2017-04-24 ENCOUNTER — Other Ambulatory Visit (HOSPITAL_COMMUNITY): Payer: Self-pay

## 2017-04-24 DIAGNOSIS — F319 Bipolar disorder, unspecified: Secondary | ICD-10-CM

## 2017-04-24 MED ORDER — SERTRALINE HCL 100 MG PO TABS: 100.0000 mg | ORAL_TABLET | Freq: Every day | ORAL | 0 refills | Status: DC

## 2017-04-24 MED ORDER — LURASIDONE HCL 20 MG PO TABS: ORAL_TABLET | ORAL | 0 refills | Status: DC

## 2017-04-24 MED ORDER — LAMOTRIGINE 150 MG PO TABS: ORAL_TABLET | ORAL | 0 refills | Status: DC

## 2017-07-24 ENCOUNTER — Ambulatory Visit (HOSPITAL_COMMUNITY): Payer: BLUE CROSS/BLUE SHIELD | Admitting: Psychiatry

## 2017-08-15 ENCOUNTER — Other Ambulatory Visit (HOSPITAL_COMMUNITY): Payer: Self-pay | Admitting: Psychiatry

## 2017-08-15 DIAGNOSIS — F319 Bipolar disorder, unspecified: Secondary | ICD-10-CM

## 2017-12-04 ENCOUNTER — Other Ambulatory Visit (HOSPITAL_COMMUNITY): Payer: Self-pay | Admitting: Psychiatry

## 2017-12-04 DIAGNOSIS — F3162 Bipolar disorder, current episode mixed, moderate: Secondary | ICD-10-CM

## 2017-12-08 ENCOUNTER — Telehealth (HOSPITAL_COMMUNITY): Payer: Self-pay | Admitting: Psychiatry

## 2017-12-08 ENCOUNTER — Other Ambulatory Visit (HOSPITAL_COMMUNITY): Payer: Self-pay | Admitting: Psychiatry

## 2018-06-11 ENCOUNTER — Other Ambulatory Visit (HOSPITAL_COMMUNITY): Payer: Self-pay | Admitting: Psychiatry

## 2018-06-11 DIAGNOSIS — F319 Bipolar disorder, unspecified: Secondary | ICD-10-CM

## 2019-05-04 ENCOUNTER — Inpatient Hospital Stay (HOSPITAL_BASED_OUTPATIENT_CLINIC_OR_DEPARTMENT_OTHER)
Admission: EM | Admit: 2019-05-04 | Discharge: 2019-05-05 | DRG: 638 | Disposition: A | Discharge status: Home / Self Care | Payer: Medicare Other | Attending: Internal Medicine | Admitting: Internal Medicine

## 2019-05-04 ENCOUNTER — Observation Stay (HOSPITAL_COMMUNITY): Payer: Medicare Other

## 2019-05-04 ENCOUNTER — Emergency Department (HOSPITAL_BASED_OUTPATIENT_CLINIC_OR_DEPARTMENT_OTHER): Payer: Medicare Other

## 2019-05-04 ENCOUNTER — Other Ambulatory Visit: Payer: Self-pay

## 2019-05-04 ENCOUNTER — Encounter (HOSPITAL_BASED_OUTPATIENT_CLINIC_OR_DEPARTMENT_OTHER): Payer: Self-pay | Admitting: *Deleted

## 2019-05-04 DIAGNOSIS — Z833 Family history of diabetes mellitus: Secondary | ICD-10-CM | POA: Diagnosis not present

## 2019-05-04 DIAGNOSIS — Z794 Long term (current) use of insulin: Secondary | ICD-10-CM

## 2019-05-04 DIAGNOSIS — K219 Gastro-esophageal reflux disease without esophagitis: Secondary | ICD-10-CM | POA: Diagnosis present

## 2019-05-04 DIAGNOSIS — F17210 Nicotine dependence, cigarettes, uncomplicated: Secondary | ICD-10-CM | POA: Diagnosis present

## 2019-05-04 DIAGNOSIS — E1110 Type 2 diabetes mellitus with ketoacidosis without coma: Principal | ICD-10-CM | POA: Diagnosis present

## 2019-05-04 DIAGNOSIS — R109 Unspecified abdominal pain: Secondary | ICD-10-CM

## 2019-05-04 DIAGNOSIS — N179 Acute kidney failure, unspecified: Secondary | ICD-10-CM | POA: Diagnosis present

## 2019-05-04 DIAGNOSIS — E119 Type 2 diabetes mellitus without complications: Secondary | ICD-10-CM | POA: Diagnosis not present

## 2019-05-04 DIAGNOSIS — K760 Fatty (change of) liver, not elsewhere classified: Secondary | ICD-10-CM | POA: Diagnosis present

## 2019-05-04 DIAGNOSIS — J449 Chronic obstructive pulmonary disease, unspecified: Secondary | ICD-10-CM | POA: Diagnosis present

## 2019-05-04 DIAGNOSIS — E0810 Diabetes mellitus due to underlying condition with ketoacidosis without coma: Secondary | ICD-10-CM

## 2019-05-04 DIAGNOSIS — E875 Hyperkalemia: Secondary | ICD-10-CM | POA: Diagnosis present

## 2019-05-04 DIAGNOSIS — D72829 Elevated white blood cell count, unspecified: Secondary | ICD-10-CM

## 2019-05-04 DIAGNOSIS — Z1159 Encounter for screening for other viral diseases: Secondary | ICD-10-CM

## 2019-05-04 DIAGNOSIS — E860 Dehydration: Secondary | ICD-10-CM | POA: Diagnosis present

## 2019-05-04 DIAGNOSIS — G8929 Other chronic pain: Secondary | ICD-10-CM | POA: Diagnosis present

## 2019-05-04 DIAGNOSIS — R1012 Left upper quadrant pain: Secondary | ICD-10-CM | POA: Diagnosis not present

## 2019-05-04 DIAGNOSIS — IMO0001 Reserved for inherently not codable concepts without codable children: Secondary | ICD-10-CM

## 2019-05-04 LAB — CBC WITH DIFFERENTIAL/PLATELET
Abs Immature Granulocytes: 0.23 10*3/uL — ABNORMAL HIGH (ref 0.00–0.07)
Basophils Absolute: 0.1 10*3/uL (ref 0.0–0.1)
Basophils Relative: 0 %
Eosinophils Absolute: 0 10*3/uL (ref 0.0–0.5)
Eosinophils Relative: 0 %
HCT: 49.4 % (ref 39.0–52.0)
Hemoglobin: 15.6 g/dL (ref 13.0–17.0)
Immature Granulocytes: 1 %
Lymphocytes Relative: 5 %
Lymphs Abs: 1 10*3/uL (ref 0.7–4.0)
MCH: 29.8 pg (ref 26.0–34.0)
MCHC: 31.6 g/dL (ref 30.0–36.0)
MCV: 94.3 fL (ref 80.0–100.0)
Monocytes Absolute: 1.4 10*3/uL — ABNORMAL HIGH (ref 0.1–1.0)
Monocytes Relative: 7 %
Neutro Abs: 17 10*3/uL — ABNORMAL HIGH (ref 1.7–7.7)
Neutrophils Relative %: 87 %
Platelets: 280 10*3/uL (ref 150–400)
RBC: 5.24 MIL/uL (ref 4.22–5.81)
RDW: 13 % (ref 11.5–15.5)
WBC: 19.6 10*3/uL — ABNORMAL HIGH (ref 4.0–10.5)
nRBC: 0 % (ref 0.0–0.2)

## 2019-05-04 LAB — URINALYSIS, MICROSCOPIC (REFLEX): Bacteria, UA: NONE SEEN

## 2019-05-04 LAB — CBC
HCT: 36.4 % — ABNORMAL LOW (ref 39.0–52.0)
Hemoglobin: 12.1 g/dL — ABNORMAL LOW (ref 13.0–17.0)
MCH: 30.3 pg (ref 26.0–34.0)
MCHC: 33.2 g/dL (ref 30.0–36.0)
MCV: 91.2 fL (ref 80.0–100.0)
Platelets: 183 10*3/uL (ref 150–400)
RBC: 3.99 MIL/uL — ABNORMAL LOW (ref 4.22–5.81)
RDW: 12.8 % (ref 11.5–15.5)
WBC: 15.3 10*3/uL — ABNORMAL HIGH (ref 4.0–10.5)
nRBC: 0 % (ref 0.0–0.2)

## 2019-05-04 LAB — POCT I-STAT EG7
Acid-base deficit: 18 mmol/L — ABNORMAL HIGH (ref 0.0–2.0)
Bicarbonate: 9.6 mmol/L — ABNORMAL LOW (ref 20.0–28.0)
Calcium, Ion: 1.12 mmol/L — ABNORMAL LOW (ref 1.15–1.40)
HCT: 48 % (ref 39.0–52.0)
Hemoglobin: 16.3 g/dL (ref 13.0–17.0)
O2 Saturation: 65 %
Patient temperature: 98.1
Potassium: 5.2 mmol/L — ABNORMAL HIGH (ref 3.5–5.1)
Sodium: 133 mmol/L — ABNORMAL LOW (ref 135–145)
TCO2: 10 mmol/L — ABNORMAL LOW (ref 22–32)
pCO2, Ven: 26.9 mmHg — ABNORMAL LOW (ref 44.0–60.0)
pH, Ven: 7.159 — CL (ref 7.250–7.430)
pO2, Ven: 42 mmHg (ref 32.0–45.0)

## 2019-05-04 LAB — BASIC METABOLIC PANEL
Anion gap: 10 (ref 5–15)
Anion gap: 5 (ref 5–15)
BUN: 40 mg/dL — ABNORMAL HIGH (ref 6–20)
BUN: 33 mg/dL — ABNORMAL HIGH (ref 6–20)
CO2: 19 mmol/L — ABNORMAL LOW (ref 22–32)
CO2: 22 mmol/L (ref 22–32)
Calcium: 8.4 mg/dL — ABNORMAL LOW (ref 8.9–10.3)
Calcium: 7.9 mg/dL — ABNORMAL LOW (ref 8.9–10.3)
Chloride: 108 mmol/L (ref 98–111)
Chloride: 111 mmol/L (ref 98–111)
Creatinine, Ser: 1.16 mg/dL (ref 0.61–1.24)
Creatinine, Ser: 1.02 mg/dL (ref 0.61–1.24)
GFR calc Af Amer: >60 mL/min (ref >60)
GFR calc Af Amer: >60 mL/min (ref >60)
GFR calc non Af Amer: >60 mL/min (ref >60)
GFR calc non Af Amer: >60 mL/min (ref >60)
Glucose, Bld: 270 mg/dL — ABNORMAL HIGH (ref 70–99)
Glucose, Bld: 178 mg/dL — ABNORMAL HIGH (ref 70–99)
Potassium: 3.7 mmol/L (ref 3.5–5.1)
Potassium: 3.8 mmol/L (ref 3.5–5.1)
Sodium: 137 mmol/L (ref 135–145)
Sodium: 138 mmol/L (ref 135–145)

## 2019-05-04 LAB — GLUCOSE, CAPILLARY
Glucose-Capillary: 156 mg/dL — ABNORMAL HIGH (ref 70–99)
Glucose-Capillary: 175 mg/dL — ABNORMAL HIGH (ref 70–99)
Glucose-Capillary: 190 mg/dL — ABNORMAL HIGH (ref 70–99)
Glucose-Capillary: 194 mg/dL — ABNORMAL HIGH (ref 70–99)
Glucose-Capillary: 261 mg/dL — ABNORMAL HIGH (ref 70–99)
Glucose-Capillary: 340 mg/dL — ABNORMAL HIGH (ref 70–99)

## 2019-05-04 LAB — URINALYSIS, ROUTINE W REFLEX MICROSCOPIC
Bilirubin Urine: NEGATIVE
Glucose, UA: >=500 mg/dL — AB
Ketones, ur: >80 mg/dL — AB
Leukocytes,Ua: NEGATIVE
Nitrite: NEGATIVE
Protein, ur: NEGATIVE mg/dL
Specific Gravity, Urine: 1.025 (ref 1.005–1.030)
pH: 5.5 (ref 5.0–8.0)

## 2019-05-04 LAB — CBG MONITORING, ED
Glucose-Capillary: >600 mg/dL (ref 70–99)
Glucose-Capillary: 532 mg/dL (ref 70–99)
Glucose-Capillary: 416 mg/dL — ABNORMAL HIGH (ref 70–99)
Glucose-Capillary: 461 mg/dL — ABNORMAL HIGH (ref 70–99)

## 2019-05-04 LAB — TROPONIN I (HIGH SENSITIVITY)
Troponin I (High Sensitivity): 5 ng/L (ref <18)
Troponin I (High Sensitivity): 6 ng/L (ref <18)

## 2019-05-04 LAB — COMPREHENSIVE METABOLIC PANEL
ALT: 26 U/L (ref 0–44)
AST: 16 U/L (ref 15–41)
Albumin: 4.9 g/dL (ref 3.5–5.0)
Alkaline Phosphatase: 130 U/L — ABNORMAL HIGH (ref 38–126)
Anion gap: 28 — ABNORMAL HIGH (ref 5–15)
BUN: 50 mg/dL — ABNORMAL HIGH (ref 6–20)
CO2: 9 mmol/L — ABNORMAL LOW (ref 22–32)
Calcium: 9.1 mg/dL (ref 8.9–10.3)
Chloride: 96 mmol/L — ABNORMAL LOW (ref 98–111)
Creatinine, Ser: 2.03 mg/dL — ABNORMAL HIGH (ref 0.61–1.24)
GFR calc Af Amer: 45 mL/min — ABNORMAL LOW (ref >60)
GFR calc non Af Amer: 39 mL/min — ABNORMAL LOW (ref >60)
Glucose, Bld: 675 mg/dL (ref 70–99)
Potassium: 5.1 mmol/L (ref 3.5–5.1)
Sodium: 133 mmol/L — ABNORMAL LOW (ref 135–145)
Total Bilirubin: 1.6 mg/dL — ABNORMAL HIGH (ref 0.3–1.2)
Total Protein: 8.2 g/dL — ABNORMAL HIGH (ref 6.5–8.1)

## 2019-05-04 LAB — MRSA PCR SCREENING: MRSA by PCR: NEGATIVE

## 2019-05-04 LAB — SARS CORONAVIRUS 2 AG (30 MIN TAT): SARS Coronavirus 2 Ag: NEGATIVE

## 2019-05-04 LAB — LIPASE, BLOOD: Lipase: 19 U/L (ref 11–51)

## 2019-05-04 LAB — SARS CORONAVIRUS 2 BY RT PCR (HOSPITAL ORDER, PERFORMED IN ~~LOC~~ HOSPITAL LAB): SARS Coronavirus 2: NEGATIVE

## 2019-05-04 LAB — HEMOGLOBIN A1C
Hgb A1c MFr Bld: 12.4 % — ABNORMAL HIGH (ref 4.8–5.6)
Mean Plasma Glucose: 309.18 mg/dL

## 2019-05-04 MED ORDER — SODIUM CHLORIDE 0.9 % IV SOLN
INTRAVENOUS | Status: AC
  Administered 2019-05-04 (×2): via INTRAVENOUS

## 2019-05-04 MED ORDER — INSULIN REGULAR(HUMAN) IN NACL 100-0.9 UT/100ML-% IV SOLN
INTRAVENOUS | Status: DC
  Administered 2019-05-04: 4.8 [IU]/h via INTRAVENOUS
  Administered 2019-05-04: 12:00:00 5.4 [IU]/h via INTRAVENOUS
  Filled 2019-05-04 (×2): qty 100

## 2019-05-04 MED ORDER — POTASSIUM CHLORIDE 10 MEQ/100ML IV SOLN
10.0000 meq | INTRAVENOUS | Status: AC
  Administered 2019-05-04 (×4): 10 meq via INTRAVENOUS
  Filled 2019-05-04 (×4): qty 100

## 2019-05-04 MED ORDER — DEXTROSE-NACL 5-0.45 % IV SOLN
INTRAVENOUS | Status: DC
  Administered 2019-05-04: 21:00:00 via INTRAVENOUS

## 2019-05-04 MED ORDER — SODIUM CHLORIDE 0.9 % IV SOLN: INTRAVENOUS | Status: DC

## 2019-05-04 MED ORDER — SODIUM CHLORIDE 0.9 % IV BOLUS
500.0000 mL | Freq: Once | INTRAVENOUS | Status: AC
  Administered 2019-05-04: 12:00:00 500 mL via INTRAVENOUS

## 2019-05-04 MED ORDER — INSULIN REGULAR(HUMAN) IN NACL 100-0.9 UT/100ML-% IV SOLN: INTRAVENOUS | Status: DC

## 2019-05-04 MED ORDER — DEXTROSE-NACL 5-0.45 % IV SOLN: INTRAVENOUS | Status: DC

## 2019-05-04 MED ORDER — SODIUM CHLORIDE 0.9 % IV BOLUS
1000.0000 mL | Freq: Once | INTRAVENOUS | Status: AC
  Administered 2019-05-04: 15:00:00 1000 mL via INTRAVENOUS

## 2019-05-04 MED ORDER — CHLORHEXIDINE GLUCONATE CLOTH 2 % EX PADS
6.0000 | MEDICATED_PAD | Freq: Every day | CUTANEOUS | Status: DC
  Administered 2019-05-05: 6 via TOPICAL

## 2019-05-04 MED ORDER — ORAL CARE MOUTH RINSE
15.0000 mL | Freq: Two times a day (BID) | OROMUCOSAL | Status: DC
  Administered 2019-05-04: 15 mL via OROMUCOSAL

## 2019-05-04 MED ORDER — ENOXAPARIN SODIUM 40 MG/0.4ML ~~LOC~~ SOLN
40.0000 mg | SUBCUTANEOUS | Status: DC
  Administered 2019-05-04: 20:00:00 40 mg via SUBCUTANEOUS
  Filled 2019-05-04: qty 0.4

## 2019-05-04 MED ORDER — ONDANSETRON HCL 4 MG/2ML IJ SOLN
4.0000 mg | Freq: Once | INTRAMUSCULAR | Status: AC
  Administered 2019-05-04: 12:00:00 4 mg via INTRAVENOUS
  Filled 2019-05-04: qty 2

## 2019-05-04 MED ORDER — SODIUM CHLORIDE 0.9 % IV BOLUS
1000.0000 mL | Freq: Once | INTRAVENOUS | Status: AC
  Administered 2019-05-04: 13:00:00 1000 mL via INTRAVENOUS

## 2019-05-04 NOTE — H&P (Signed)
History and Physical  [name]. LPF:[id number] DOB: [birth year] DOA: 05/04/2019  Referring physician: EDP PCP: [name], NP   Chief Complaint: DKA, transfer from [facility] ED  HPI: [name]. is a 43 y.o. male  Presents to [facility] due to n/v, no n/v since in the ED , but he is found to be in DKA, he is started in insulin drip and sent to [facility].  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 He reports followed with endocrinologist Dr [name], per chart review, his last visit with DR DMeredith Pelwas in 06/2018.   Review of Systems:  Detail per HPI, Review of systems are otherwise negative  Past Medical History:  Diagnosis Date  . Allergy   . Anxiety   . Arthritis    cervical- disc disease   . [place]   . Chronic neck pain   . Chronic pain disorder    Sees [facility]  . Depression   . Diabetes mellitus    Type II  . Drug-seeking behavior   . GERD (gastroesophageal reflux disease)   . Hyperlipidemia   . Hypertension   . Hypogonadism   . Opiate dependence, continuous ([place]   . [facility]    [facility] . [facility] ([facility]   . Urinary incontinence    detrusor instability   Past Surgical History:  Procedure Laterality Date  . ANTERIOR CERVICAL DECOMP/DISCECTOMY FUSION N/A 12/21/2015   Procedure: Cervical four-five, Cervical five-six Anterior Cervical Discectomy Fusion;  Surgeon: DEustace Moore MD;  [place] [place];  Service: Neurosurgery;  Laterality: N/A;  . APPENDECTOMY  2002  . CARDIAC CATHETERIZATION  2006   done due to pain in his chest, no blockage seen  . HERNIA REPAIR  2002   abdominal  . NASAL SINUS SURGERY   2008   Social History:  reports that he has been smoking cigarettes. He has a 37.50 pack-year smoking history. He has never used smokeless tobacco. He reports that he does not drink alcohol or use drugs. Patient lives at home& is able to participate in activities of daily living independently , he works as a eClinical biochemist   Allergies  Allergen Reactions  . Tramadol Other (See Comments)    SERATONIN SYNDROME DIZZINESS  . Ciprofloxacin Hives    Family History  Problem Relation Age of Onset  . Diabetes Mother   . Hypertension Mother   . Cirrhosis Mother   . Depression Mother   . Bipolar disorder Mother   . Arthritis Father 430      osteoarthritis  . Bipolar disorder Father   . Heart disease Paternal Uncle 457      MI  . Heart disease Maternal Grandfather        late 60's--MI  . Cancer Paternal Grandmother 52      lung  . Bipolar disorder Paternal Grandmother   . Heart disease Paternal Grandfather 558      MI  . Bipolar disorder Paternal Grandfather   . Diabetes Sister        borderline      Prior to Admission medications   Medication Sig Start Date End  Date Taking? Authorizing Provider  insulin aspart (NOVOLOG) 100 UNIT/ML injection Use max 58 units with V-go pump: For diabetes management 11/21/15   Lindell Spar I, NP  Insulin Disposable Pump (V-GO 20) KIT USE AS DIRECTED: For diabetes management 11/21/15   Lindell Spar I, NP  insulin glargine (LANTUS) 100 UNIT/ML injection Inject 0.2 mLs (20 Units total) into the skin at bedtime. For diabetes management 11/21/15   Lindell Spar I, NP  insulin lispro (HUMALOG) 100 UNIT/ML injection Inject 58 Units into the skin continuous. V-go pump    [provider]  albuterol (PROVENTIL) 90 MCG/ACT inhaler Inhale 2 puffs into the lungs every 6 (six) hours as needed for wheezing. 07/22/11 02/04/12  Debbrah Alar, NP    Physical Exam: BP 129/76   Pulse 90   Temp 98.1 F (36.7 C) (Oral)   Resp (!) 22   Ht _0  (1.854 m)    Wt 74.8 kg   SpO2 99%   BMI 21.77 kg/m   General:  Appear dehydrated, Weak, oriented x3 Eyes: PERRL ENT: unremarkable Neck: supple, no JVD Cardiovascular: RRR Respiratory: CTABL Abdomen: soft/ND/NT, positive bowel sounds Skin: no rash Musculoskeletal:  No edema Psychiatric: calm/cooperative Neurologic: no focal findings            Labs on Admission:  Basic Metabolic Panel: Recent Labs  Lab 05/04/19 1123 05/04/19 1222  NA 133* 133*  K 5.1 5.2*  CL 96*  --   CO2 9*  --   GLUCOSE 675*  --   BUN 50*  --   CREATININE 2.03*  --   CALCIUM 9.1  --    Liver Function Tests: Recent Labs  Lab 05/04/19 1123  AST 16  ALT 26  ALKPHOS 130*  BILITOT 1.6*  PROT 8.2*  ALBUMIN 4.9   No results for input(s): LIPASE, AMYLASE in the last 168 hours. No results for input(s): AMMONIA in the last 168 hours. CBC: Recent Labs  Lab 05/04/19 1123 05/04/19 1222  WBC 19.6*  --   NEUTROABS 17.0*  --   HGB 15.6 16.3  HCT 49.4 48.0  MCV 94.3  --   PLT 280  --    Cardiac Enzymes: No results for input(s): CKTOTAL, CKMB, CKMBINDEX, TROPONINI in the last 168 hours.  BNP (last 3 results) No results for input(s): BNP in the last 8760 hours.  ProBNP (last 3 results) No results for input(s): PROBNP in the last 8760 hours.  CBG: Recent Labs  Lab 05/04/19 1216 05/04/19 1319 05/04/19 1430 05/04/19 1524  GLUCAP >600* 532* 461* 416*    Radiological Exams on Admission: Dg Chest Port 1 View  Result Date: 05/04/2019 CLINICAL DATA:  Left-sided pain.  Vomiting.  Shortness of breath. EXAM: PORTABLE CHEST 1 VIEW COMPARISON:  CT 07/05/2016.  Chest x-ray 2017. FINDINGS: Mediastinum and hilar structures normal. Lungs are clear. No pleural effusion pneumothorax. Heart size normal. Thoracic spine scoliosis concave left. No acute bony abnormality. IMPRESSION: No acute cardiopulmonary disease.  Stable chest. Electronically Signed   By: Marcello Moores  Register   On: 05/04/2019 12:20    EKG:  Independently reviewed. Sinus tachycardia, no acute ST/T changes   Assessment/Plan Present on Admission: . DKA (diabetic ketoacidoses) ([place])    DKA with pseudohyponatremia, with h/o insulin dependent diabetes, uncontrolled Glucose on presentation to [facility] was 675 vbg ph 7.1, + urine ketone Continue insulin drip, ivf, npo for now  N/V  Likely from DKA, non since in the ED kub pending Npo for now  LUQ ab  pain, he denies chest pain High sensitivity troponin 5, repeat pending Lipase in process kub ordered  Mild hyperkalemia k 5.2, on tele Continue hydration, insulin drip Repeat bmp q4hrs  AKI ua + glucose, + ketone, negative for bacteria aki likely prerenal due to dehydration in the setting of DKA Continue hydration, repeat bmp, renal dosing meds   Leukocytosis ua negative for uti cxr no acute infiltrate Add on blood culture kub pending Continue hydration, no fever    Psych , denies depression, reports not on any meds for it.  SARS cov2 screening pending [facility]  Abbott testing was negative, however it is a less reliable test  DVT prophylaxis: lovenox  Consultants: none  Code Status: full   Family Communication:  Patient   Disposition Plan: stepdown , observation   Time spent: 29mns  FFlorencia ReasonsMD, PhD [facility] Pager [phone] 7PM-7AM, please contact night-coverage at [email or web address], password [email or web address]

## 2019-05-04 NOTE — ED Triage Notes (Addendum)
PCP told him to come to ED to be tested for Covid test.  Pain to left side, vomiting, shortness of breath for 2 days. Also c/o dizziness and weakness.  Noted that patient is unsteady on his gait. Denies fever.

## 2019-05-04 NOTE — ED Notes (Signed)
Logan, patient's daughter is made aware that [facility] is here to pick up the patient.

## 2019-05-04 NOTE — ED Notes (Signed)
ED Provider at bedside. 

## 2019-05-04 NOTE — ED Notes (Signed)
Report to [facility].  

## 2019-05-04 NOTE — ED Notes (Signed)
Patient's daughter, Rolla Plate was notified of patient's transfer to [place].  Room # and phone # to the hospital was given to her.

## 2019-05-04 NOTE — ED Provider Notes (Signed)
[facility] EMERGENCY DEPARTMENT Provider Note   CSN: [id number] Arrival date & time: 05/04/19  1043    History   Chief Complaint Chief Complaint  Patient presents with  . Shortness of Breath    HPI [name]. is a 43 y.o. male.     The history is provided by the patient and medical records. No language interpreter was used.  Shortness of Breath  [name]. is a 43 y.o. male who presents to the Emergency Department complaining of chest pain/sob. Since to the emergency department complaining of chest pain and difficulty breathing that began two days ago. He began feeling poorly on Monday with left-sided/axillary chest pain described as a punch in the chest. He has associated body aches, shortness of breath, nausea, vomiting and malaise. No reports of fevers. He complains of mild abdominal discomfort. He also feels dizzy with generalized weakness. No known coronavirus exposures. He does describe altered taste. He has a history of diabetes, no additional medical problems. Symptoms are severe and constant in nature. Past Medical History:  Diagnosis Date  . Allergy   . Anxiety   . Arthritis    cervical- disc disease   . Bipolar depression ([place])   . Chronic neck pain   . Chronic pain disorder    Sees [facility]  . Depression   . Diabetes mellitus    Type II  . Drug-seeking behavior   . GERD (gastroesophageal reflux disease)   . Hyperlipidemia   . Hypertension   . Hypogonadism   . Opiate dependence, continuous ([place])   . Pneumonia    11/2014- [facility]  . Polysubstance abuse [facility])   . Urinary incontinence    detrusor instability    Patient Active Problem List   Diagnosis Date Noted  . DKA (diabetic ketoacidoses) ([place]) 05/04/2019  . S/P cervical spinal fusion 12/21/2015  . Bipolar 1 disorder, depressed ([place]) 11/15/2015  . Bipolar disorder with depression ([place]) 07/31/2015  . Insomnia   . Sinusitis, acute maxillary  02/06/2015  . Headache syndrome 02/06/2015  . Atypical pneumonia 01/18/2015  . Renal mass 12/20/2014  . Lung nodule 12/07/2014  . Muscle spasms of neck 10/17/2014  . Bipolar I disorder, most recent episode depressed ([place])   . Alcohol dependence with alcohol-induced mood disorder ([place])   . MDD (major depressive disorder), severe ([place]) 09/19/2014  . Hyponatremia 08/15/2014  . Abscess 08/15/2014  . Loss of weight 05/26/2014  . Leukocytosis, unspecified 05/26/2014  . Rash and nonspecific skin eruption 04/21/2014  . Unspecified episodic mood disorder 03/21/2014  . Alcohol dependence ([place]) 03/21/2014  . Alcohol abuse 03/20/2014  . Cocaine abuse ([place]) 03/20/2014  . Neck pain 01/31/2014  . Rotator cuff dysfunction 11/13/2013  . Closed fracture of unspecified phalanx or phalanges of hand 10/04/2013  . Generalized anxiety disorder 08/30/2013  . Anxiety disorder 08/29/2013  . Admitted with dehydration 08/29/2013  . Gastroenteritis 08/29/2013  . Malnutrition of mild degree ([place]) 08/29/2013  . Major depressive disorder, recurrent episode, severe ([place]) 07/14/2013  . HYPOGONADISM 08/28/2009  . 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 Diabetes type 2, uncontrolled ([place]) 09/30/2006  . HYPERLIPIDEMIA 09/30/2006  . TOBACCO ABUSE 09/30/2006  . Essential hypertension 09/30/2006  . ALLERGIC RHINITIS 09/30/2006  . COPD (chronic obstructive pulmonary disease) ([place]) 09/30/2006  . GERD 09/30/2006  . [place] DISEASE, LUMBAR SPINE 09/30/2006  . Backache 09/30/2006  . FIBROMYALGIA 09/30/2006  . FATTY LIVER DISEASE, HX OF 09/30/2006    Past  Surgical History:  Procedure Laterality Date  . ANTERIOR CERVICAL DECOMP/DISCECTOMY FUSION N/A 12/21/2015   Procedure: Cervical four-five, Cervical five-six Anterior Cervical Discectomy Fusion;  Surgeon: Eustace Moore, MD;  Location: [place] NEURO ORS;  Service: Neurosurgery;  Laterality: N/A;  . APPENDECTOMY  2002  . CARDIAC CATHETERIZATION  2006   done  due to pain in his chest, no blockage seen  . HERNIA REPAIR  2002   abdominal  . NASAL SINUS SURGERY  2008        Home Medications    Prior to Admission medications   Medication Sig Start Date End Date Taking? Authorizing Provider  insulin aspart (NOVOLOG) 100 UNIT/ML injection Use max 58 units with V-go pump: For diabetes management 11/21/15   Lindell Spar I, NP  Insulin Disposable Pump (V-GO 20) KIT USE AS DIRECTED: For diabetes management 11/21/15   Lindell Spar I, NP  insulin glargine (LANTUS) 100 UNIT/ML injection Inject 0.2 mLs (20 Units total) into the skin at bedtime. For diabetes management 11/21/15   Lindell Spar I, NP  insulin lispro (HUMALOG) 100 UNIT/ML injection Inject 58 Units into the skin continuous. V-go pump    [provider]  albuterol (PROVENTIL) 90 MCG/ACT inhaler Inhale 2 puffs into the lungs every 6 (six) hours as needed for wheezing. 07/22/11 02/04/12  Debbrah Alar, NP    Family History Family History  Problem Relation Age of Onset  . Diabetes Mother   . Hypertension Mother   . Cirrhosis Mother   . Depression Mother   . Bipolar disorder Mother   . Arthritis Father 32       osteoarthritis  . Bipolar disorder Father   . Heart disease Paternal Uncle 48       MI  . Heart disease Maternal Grandfather        late 60's--MI  . Cancer Paternal Grandmother 42       lung  . Bipolar disorder Paternal Grandmother   . Heart disease Paternal Grandfather 59       MI  . Bipolar disorder Paternal Grandfather   . Diabetes Sister        borderline    Social History Social History   Tobacco Use  . Smoking status: Current Every Day Smoker    Packs/day: 1.50    Years: 25.00    Pack years: 37.50    Types: Cigarettes  . Smokeless tobacco: Never Used  Substance Use Topics  . Alcohol use: No    Alcohol/week: 0.0 standard drinks  . Drug use: No     Allergies   Tramadol and Ciprofloxacin   Review of Systems Review of Systems  Respiratory:  Positive for shortness of breath.   All other systems reviewed and are negative.    Physical Exam Updated Vital Signs BP 138/85 (BP Location: Left Arm)   Pulse 92   Temp 98.1 F (36.7 C) (Oral)   Resp 13   Ht '6\' 1"'  (1.854 m)   Wt 74.8 kg   SpO2 96%   BMI 21.77 kg/m   Physical Exam Vitals signs and nursing note reviewed.  Constitutional:      Appearance: He is well-developed.     Comments: Appears uncomfortable  HENT:     Head: Normocephalic and atraumatic.  Cardiovascular:     Rate and Rhythm: Regular rhythm. Tachycardia present.     Heart sounds: No murmur.  Pulmonary:     Effort: Pulmonary effort is normal. No respiratory distress.     Breath sounds:  Normal breath sounds.  Abdominal:     Tenderness: There is no abdominal tenderness. There is no guarding or rebound.     Comments: Mild generalized abdominal tenderness  Musculoskeletal:        General: No swelling or tenderness.  Skin:    General: Skin is warm and dry.  Neurological:     Mental Status: He is alert and oriented to person, place, and time.  Psychiatric:        Mood and Affect: Mood normal.        Behavior: Behavior normal.      ED Treatments / Results  Labs (all labs ordered are listed, but only abnormal results are displayed) Labs Reviewed  COMPREHENSIVE METABOLIC PANEL - Abnormal; Notable for the following components:      Result Value   Sodium 133 (*)    Chloride 96 (*)    CO2 9 (*)    Glucose, Bld 675 (*)    BUN 50 (*)    Creatinine, Ser 2.03 (*)    Total Protein 8.2 (*)    Alkaline Phosphatase 130 (*)    Total Bilirubin 1.6 (*)    GFR calc non Af Amer 39 (*)    GFR calc Af Amer 45 (*)    Anion gap 28 (*)    All other components within normal limits  CBC WITH DIFFERENTIAL/PLATELET - Abnormal; Notable for the following components:   WBC 19.6 (*)    Neutro Abs 17.0 (*)    Monocytes Absolute 1.4 (*)    Abs Immature Granulocytes 0.23 (*)    All other components within normal limits   URINALYSIS, ROUTINE W REFLEX MICROSCOPIC - Abnormal; Notable for the following components:   Glucose, UA >=500 (*)    Hgb urine dipstick TRACE (*)    Ketones, ur >80 (*)    All other components within normal limits  CBG MONITORING, ED - Abnormal; Notable for the following components:   Glucose-Capillary >600 (*)    All other components within normal limits  POCT I-STAT EG7 - Abnormal; Notable for the following components:   pH, Ven 7.159 (*)    pCO2, Ven 26.9 (*)    Bicarbonate 9.6 (*)    TCO2 10 (*)    Acid-base deficit 18.0 (*)    Sodium 133 (*)    Potassium 5.2 (*)    Calcium, Ion 1.12 (*)    All other components within normal limits  CBG MONITORING, ED - Abnormal; Notable for the following components:   Glucose-Capillary 532 (*)    All other components within normal limits  CBG MONITORING, ED - Abnormal; Notable for the following components:   Glucose-Capillary 461 (*)    All other components within normal limits  CBG MONITORING, ED - Abnormal; Notable for the following components:   Glucose-Capillary 416 (*)    All other components within normal limits  SARS CORONAVIRUS 2 (HOSP ORDER, PERFORMED IN [place] LAB VIA ABBOTT ID)  TROPONIN I (HIGH SENSITIVITY)  URINALYSIS, MICROSCOPIC (REFLEX)  TROPONIN I (HIGH SENSITIVITY)  LIPASE, BLOOD  I-STAT VENOUS BLOOD GAS, ED    EKG EKG Interpretation  Date/Time:  Wednesday May 04 2019 11:49:28 EDT Ventricular Rate:  118 PR Interval:    QRS Duration: 89 QT Interval:  304 QTC Calculation: 426 R Axis:   72 Text Interpretation:  Sinus tachycardia Multiform ventricular premature complexes LAE, consider biatrial enlargement ST elev, probable normal early repol pattern Baseline wander in lead(s) V3 Confirmed by Quintella Reichert [phone])  on 05/04/2019 12:12:18 PM   Radiology Dg Chest Port 1 View  Result Date: 05/04/2019 CLINICAL DATA:  Left-sided pain.  Vomiting.  Shortness of breath. EXAM: PORTABLE CHEST 1 VIEW COMPARISON:  CT  07/05/2016.  Chest x-ray 2017. FINDINGS: Mediastinum and hilar structures normal. Lungs are clear. No pleural effusion pneumothorax. Heart size normal. Thoracic spine scoliosis concave left. No acute bony abnormality. IMPRESSION: No acute cardiopulmonary disease.  Stable chest. Electronically Signed   By: Marcello Moores  Register   On: 05/04/2019 12:20    Procedures Procedures (including critical care time) CRITICAL CARE Performed by: Quintella Reichert   Total critical care time: 45 minutes  Critical care time was exclusive of separately billable procedures and treating other patients.  Critical care was necessary to treat or prevent imminent or life-threatening deterioration.  Critical care was time spent personally by me on the following activities: development of treatment plan with patient and/or surrogate as well as nursing, discussions with consultants, evaluation of patient's response to treatment, examination of patient, obtaining history from patient or surrogate, ordering and performing treatments and interventions, ordering and review of laboratory studies, ordering and review of radiographic studies, pulse oximetry and re-evaluation of patient's condition.  Medications Ordered in ED Medications  dextrose 5 %-0.45 % sodium chloride infusion ( Intravenous Stopped 05/04/19 1529)  insulin regular, human (MYXREDLIN) 100 units/ 100 mL infusion ( Intravenous Rate/Dose Verify 05/04/19 1531)  sodium chloride 0.9 % bolus 500 mL (0 mLs Intravenous Stopped 05/04/19 1304)  ondansetron (ZOFRAN) injection 4 mg (4 mg Intravenous Given 05/04/19 1212)  sodium chloride 0.9 % bolus 1,000 mL (0 mLs Intravenous Stopped 05/04/19 1408)  sodium chloride 0.9 % bolus 1,000 mL (1,000 mLs Intravenous New Bag/Given 05/04/19 1528)     Initial Impression / Assessment and Plan / ED Course  I have reviewed the triage vital signs and the nursing notes.  Pertinent labs & imaging results that were available during my care of  the patient were reviewed by me and considered in my medical decision making (see chart for details).        Patient with history of diabetes here for evaluation of chest pain, vomiting. He is ill appearing on evaluation. EKG without acute ischemic changes. Labs are significant for market hyperglycemia, AKI. Presentation is consistent with DKA. He was treated with aggressive fluid hydration as well as glucose stabilizer. Discussed with patient findings of studies and recommendation for admission and he is in agreement with treatment plan. Hospitalist consulted for admission. Plan to transfer to [facility] for admission and ongoing treatment.  Final Clinical Impressions(s) / ED Diagnoses   Final diagnoses:  Diabetic ketoacidosis without coma associated with type 2 diabetes mellitus [facility])    ED Discharge Orders    None       Quintella Reichert, MD 05/04/19 [phone]

## 2019-05-05 DIAGNOSIS — E0810 Diabetes mellitus due to underlying condition with ketoacidosis without coma: Secondary | ICD-10-CM

## 2019-05-05 DIAGNOSIS — E119 Type 2 diabetes mellitus without complications: Secondary | ICD-10-CM

## 2019-05-05 LAB — GLUCOSE, CAPILLARY
Glucose-Capillary: 109 mg/dL — ABNORMAL HIGH (ref 70–99)
Glucose-Capillary: 110 mg/dL — ABNORMAL HIGH (ref 70–99)
Glucose-Capillary: 112 mg/dL — ABNORMAL HIGH (ref 70–99)
Glucose-Capillary: 114 mg/dL — ABNORMAL HIGH (ref 70–99)
Glucose-Capillary: 391 mg/dL — ABNORMAL HIGH (ref 70–99)
Glucose-Capillary: 82 mg/dL (ref 70–99)
Glucose-Capillary: 87 mg/dL (ref 70–99)

## 2019-05-05 LAB — CBC WITH DIFFERENTIAL/PLATELET
Abs Immature Granulocytes: 0.11 10*3/uL — ABNORMAL HIGH (ref 0.00–0.07)
Basophils Absolute: 0 10*3/uL (ref 0.0–0.1)
Basophils Relative: 0 %
Eosinophils Absolute: 0 10*3/uL (ref 0.0–0.5)
Eosinophils Relative: 0 %
HCT: 37.5 % — ABNORMAL LOW (ref 39.0–52.0)
Hemoglobin: 12.4 g/dL — ABNORMAL LOW (ref 13.0–17.0)
Immature Granulocytes: 1 %
Lymphocytes Relative: 16 %
Lymphs Abs: 2.2 10*3/uL (ref 0.7–4.0)
MCH: 30.3 pg (ref 26.0–34.0)
MCHC: 33.1 g/dL (ref 30.0–36.0)
MCV: 91.7 fL (ref 80.0–100.0)
Monocytes Absolute: 2.1 10*3/uL — ABNORMAL HIGH (ref 0.1–1.0)
Monocytes Relative: 15 %
Neutro Abs: 9.4 10*3/uL — ABNORMAL HIGH (ref 1.7–7.7)
Neutrophils Relative %: 68 %
Platelets: 169 10*3/uL (ref 150–400)
RBC: 4.09 MIL/uL — ABNORMAL LOW (ref 4.22–5.81)
RDW: 12.9 % (ref 11.5–15.5)
WBC: 13.9 10*3/uL — ABNORMAL HIGH (ref 4.0–10.5)
nRBC: 0 % (ref 0.0–0.2)

## 2019-05-05 LAB — BASIC METABOLIC PANEL
Anion gap: 7 (ref 5–15)
Anion gap: 3 — ABNORMAL LOW (ref 5–15)
BUN: 28 mg/dL — ABNORMAL HIGH (ref 6–20)
BUN: 22 mg/dL — ABNORMAL HIGH (ref 6–20)
CO2: 22 mmol/L (ref 22–32)
CO2: 24 mmol/L (ref 22–32)
Calcium: 8.5 mg/dL — ABNORMAL LOW (ref 8.9–10.3)
Calcium: 8.4 mg/dL — ABNORMAL LOW (ref 8.9–10.3)
Chloride: 109 mmol/L (ref 98–111)
Chloride: 112 mmol/L — ABNORMAL HIGH (ref 98–111)
Creatinine, Ser: 0.94 mg/dL (ref 0.61–1.24)
Creatinine, Ser: 0.83 mg/dL (ref 0.61–1.24)
GFR calc Af Amer: >60 mL/min (ref >60)
GFR calc Af Amer: >60 mL/min (ref >60)
GFR calc non Af Amer: >60 mL/min (ref >60)
GFR calc non Af Amer: >60 mL/min (ref >60)
Glucose, Bld: 117 mg/dL — ABNORMAL HIGH (ref 70–99)
Glucose, Bld: 111 mg/dL — ABNORMAL HIGH (ref 70–99)
Potassium: 4.1 mmol/L (ref 3.5–5.1)
Potassium: 3.7 mmol/L (ref 3.5–5.1)
Sodium: 138 mmol/L (ref 135–145)
Sodium: 139 mmol/L (ref 135–145)

## 2019-05-05 LAB — HEPATIC FUNCTION PANEL
ALT: 17 U/L (ref 0–44)
AST: 14 U/L — ABNORMAL LOW (ref 15–41)
Albumin: 3.6 g/dL (ref 3.5–5.0)
Alkaline Phosphatase: 82 U/L (ref 38–126)
Bilirubin, Direct: 0.1 mg/dL (ref 0.0–0.2)
Indirect Bilirubin: 0.4 mg/dL (ref 0.3–0.9)
Total Bilirubin: 0.5 mg/dL (ref 0.3–1.2)
Total Protein: 5.9 g/dL — ABNORMAL LOW (ref 6.5–8.1)

## 2019-05-05 LAB — HIV ANTIBODY (ROUTINE TESTING W REFLEX): HIV Screen 4th Generation wRfx: NONREACTIVE

## 2019-05-05 MED ORDER — INSULIN ASPART 100 UNIT/ML ~~LOC~~ SOLN: 0.0000 [IU] | Freq: Every day | SUBCUTANEOUS | Status: DC

## 2019-05-05 MED ORDER — INSULIN GLARGINE 100 UNIT/ML ~~LOC~~ SOLN
10.0000 [IU] | Freq: Every day | SUBCUTANEOUS | Status: DC
  Administered 2019-05-05: 05:00:00 10 [IU] via SUBCUTANEOUS
  Filled 2019-05-05: qty 0.1

## 2019-05-05 MED ORDER — BLOOD GLUCOSE MONITOR KIT: PACK | 0 refills | Status: AC

## 2019-05-05 MED ORDER — INSULIN GLARGINE 100 UNIT/ML ~~LOC~~ SOLN: 15.0000 [IU] | Freq: Two times a day (BID) | SUBCUTANEOUS | 0 refills | Status: DC

## 2019-05-05 MED ORDER — SODIUM PHOSPHATES 45 MMOLE/15ML IV SOLN: 30.0000 mmol | Freq: Once | INTRAVENOUS | Status: DC

## 2019-05-05 MED ORDER — INSULIN ASPART 100 UNIT/ML ~~LOC~~ SOLN
0.0000 [IU] | Freq: Three times a day (TID) | SUBCUTANEOUS | Status: DC
  Administered 2019-05-05: 11:00:00 9 [IU] via SUBCUTANEOUS

## 2019-05-05 MED ORDER — BUPRENORPHINE HCL-NALOXONE HCL 8-2 MG SL SUBL
1.0000 | SUBLINGUAL_TABLET | Freq: Every day | SUBLINGUAL | Status: DC
  Administered 2019-05-05: 1 via SUBLINGUAL
  Filled 2019-05-05: qty 1

## 2019-05-05 MED ORDER — BLOOD GLUCOSE MONITOR KIT: PACK | 0 refills | Status: DC

## 2019-05-05 NOTE — Discharge Summary (Signed)
Discharge Summary  [name]. BPZ:[id number] DOB: 17-Jan-1976  PCP: [name], NP  Admit date: 05/04/2019 Discharge date: 05/05/2019  Time spent: 35mns   Recommendations for Outpatient Follow-up:  1. F/u with PCP within a week  for hospital discharge follow up, repeat cbc/bmp at follow up 2. F/u with endocrinology Dr [name]3. He reports run out testing strips, prescription provided   Discharge Diagnoses:  Active Hospital Problems   Diagnosis Date Noted  . DKA (diabetic ketoacidoses) ([facility] 05/04/2019  . AKI (acute kidney injury) ([facility]   . Insulin dependent diabetes mellitus ([place]   . Leukocytosis 05/26/2014    Resolved Hospital Problems  No resolved problems to display.    Discharge Condition: stable  Diet recommendation: heart healthy/carb modified  Filed Weights   05/04/19 1102  Weight: 74.8 kg    History of present illness:   PCP: MYevette Edwards NP   [facility]  HPI: [name] is a 43y.o. male  [facility] no n/v since in [facility] ED , but he is found to be in DKA, he is started in insulin drip and [facility]  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 He reports followed with endocrinologist [name] per chart review, his last visit with Dr DoMeredith Pelas in 06/2018.  Hospital Course:  Active Problems:   Leukocytosis   DKA (diabetic ketoacidoses) (HCC)   AKI (acute kidney injury) ([facility]  Insulin dependent diabetes mellitus ([facility]  DKA with pseudohyponatremia,[facility], + urine ketone He is treated with insulin drip, ivf, dka resolved, transitioned to  subQ insulin Diabetes/diet education provided    insulin dependent diabetes, uncontrolled a1c 12.4 He reports his blood glucose has not been well controlled since he is off the insulin pump due to insurance issues He reports both highs and lows at home, Pt states he always checks it in the morning, but only checks occasionally before/after meals. Pt said he has hypoglycemia at times, usuallly at 2-3 am while sleeping. Treats with food He reports use lantus 35units qhs, I have changed his lantus to 15units bid, to avoid highs and lows, continue meal coverage insulin He is advised to close follow up with endocrinology for continued diabetes management to achieved a1c goal less than 7%  N/V  Likely from DKA, non since in the ED kub unremarkable, symptom resolved.   LUQ ab pain, he denies chest pain High sensitivity troponin 5, 6 at two hrs repeat Lipase unremarkable kub no acute findings resolved  Mild hyperkalemia k 5.2, ekg/tele no acute changes Normalized with  Hydration and  insulin drip   Azotemia/ AKI -ua + glucose, + ketone, negative for bacteria -bun/cr 50/2.03 on presentation -he received hydration, bun/cr improved to 22/0.8 at discharge    Leukocytosis, wbc 19.6 on presentation, no fever ua negative for uti cxr no acute infiltrate, kub no acute findings  blood culture no growth Wbc improved with hydration, F/u with pcp, repeat basic labs   Psych , denies depression, reports not on any meds for it.  Chronic pain: on suboxone (h/o neck surgery), he  is followed by pain clinic  SARS cov2 screening negative   DVT prophylaxis while in the hospital: lovenox  Consultants: none  Code Status: full   Family Communication:  Patient   Disposition Plan: home   Procedures:  none  Consultations:  Diabetes RN  Dietitian   Discharge Exam: BP (!) 107/59   Pulse (!) 57   Temp 97.7 F (36.5 C) (Oral)   Resp 17   Ht '6\' 1"'  (1.854 m)   Wt  74.8 kg   SpO2 99%   BMI 21.77 kg/m   General: NAD Cardiovascular: RRR Respiratory: CTABL  Discharge Instructions You were cared for by a hospitalist during your hospital stay. If you have any questions about your discharge medications or the care you received while you were in the hospital after you are discharged, you can call the unit and asked to speak with the hospitalist on call if the hospitalist that took care of you is not available. Once you are discharged, your primary care physician will handle any further medical issues. Please note that NO REFILLS for any discharge medications will be authorized once you are discharged, as it is imperative that you return to your primary care physician (or establish a relationship with a primary care physician if you do not have one) for your aftercare needs so that they can reassess your need for medications and monitor your lab values.  Discharge Instructions    Ambulatory referral to Nutrition and Diabetic Education   Complete by: As directed    Diet Carb Modified   Complete by: As directed    Increase activity slowly   Complete by: As directed      Allergies as of 05/05/2019      Reactions   Tramadol Other (See Comments)   SERATONIN SYNDROME DIZZINESS SERATONIN SYNDROME DIZZINESS SERATONIN SYNDROME DIZZINESS Causes seratonin syndrome   Ciprofloxacin Hives      Medication List    STOP taking these medications   insulin aspart 100 UNIT/ML injection Commonly known as: novoLOG   V-Go 20 Kit     TAKE these medications   blood glucose meter kit and supplies Kit Dispense based on patient and insurance preference. Use up to four times daily as directed. (FOR ICD-9 250.00, 250.01).   buprenorphine-naloxone 8-2 mg Subl SL tablet Commonly known as: SUBOXONE Place 1 tablet under the tongue daily.   HumaLOG KwikPen 100 UNIT/ML KwikPen Generic drug: insulin lispro Inject 10-20 Units into the skin 3 (three) times daily.    insulin glargine 100 UNIT/ML injection Commonly known as: LANTUS Inject 0.15 mLs (15 Units total) into the skin 2 (two) times daily. For diabetes management What changed:   how much to take  when to take this      Allergies  Allergen Reactions  . Tramadol Other (See Comments)    SERATONIN SYNDROME DIZZINESS SERATONIN SYNDROME DIZZINESS SERATONIN SYNDROME DIZZINESS Causes seratonin syndrome  . Ciprofloxacin Hives   Follow-up Information    [name], NP Follow up.   Specialty: Family Medicine Why: hospital discharge follow up, repeat cbc/bmp at follow up Contact information: [place] [address] [phone]        Alanson Aly, MD Follow up.   Specialty: Internal Medicine Why: please check your blood glucose three to four times a day, bring in record for your pcp and endocrinologist to review and further adjust insulin for you. Contact information: [address] [place] [address] [phone]  The results of significant diagnostics from this hospitalization (including imaging, microbiology, ancillary and laboratory) are listed below for reference.    Significant Diagnostic Studies: Dg Abd 1 View  Result Date: 05/04/2019 CLINICAL DATA:  Left-sided abdominal pain EXAM: ABDOMEN - 1 VIEW COMPARISON:  Chest x-ray 05/04/2019 FINDINGS: Nonobstructed bowel-gas pattern with mild stool in the colon. No radiopaque calculi. IMPRESSION: Nonobstructed bowel-gas pattern. Electronically Signed   By: Donavan Foil M.D.   On: 05/04/2019 19:51   Dg Chest Port 1 View  Result Date: 05/04/2019 CLINICAL DATA:  Left-sided pain.  Vomiting.  Shortness of breath. EXAM: PORTABLE CHEST 1 VIEW COMPARISON:  CT 07/05/2016.  Chest x-ray 2017. FINDINGS: Mediastinum and hilar structures normal. Lungs are clear. No pleural effusion pneumothorax. Heart size normal. Thoracic spine scoliosis concave left. No acute bony abnormality. IMPRESSION: No acute  cardiopulmonary disease.  Stable chest. Electronically Signed   By: Marcello Moores  Register   On: 05/04/2019 12:20    Microbiology: Recent Results (from the past 240 hour(s))  SARS Coronavirus 2 (Hosp order,Performed in [facility] lab via [employer] ID)     Status: None   Collection Time: 05/04/19 11:24 AM   Specimen: Dry Nasal Swab (Abbott ID Now)  Result Value Ref Range Status   SARS Coronavirus 2 (Abbott ID Now) NEGATIVE NEGATIVE Final    Comment: (NOTE) Interpretive Result Comment(s): COVID 19 Positive SARS CoV 2 target nucleic acids are DETECTED. The SARS CoV 2 RNA is generally detectable in upper and lower respiratory specimens during the acute phase of infection.  Positive results are indicative of active infection with SARS CoV 2.  Clinical correlation with patient history and other diagnostic information is necessary to determine patient infection status.  Positive results do not rule out bacterial infection or coinfection with other viruses. The expected result is Negative. COVID 19 Negative SARS CoV 2 target nucleic acids are NOT DETECTED. The SARS CoV 2 RNA is generally detectable in upper and lower respiratory specimens during the acute phase of infection.  Negative results do not preclude SARS CoV 2 infection, do not rule out coinfections with other pathogens, and should not be used as the sole basis for treatment or other patient management decisions.  Negative results must be combined with clinical  observations, patient history, and epidemiological information. The expected result is Negative. Invalid Presence or absence of SARS CoV 2 nucleic acids cannot be determined. Repeat testing was performed on the submitted specimen and repeated Invalid results were obtained.  If clinically indicated, additional testing on a new specimen with an alternate test methodology [phone]) is advised.  The SARS CoV 2 RNA is generally detectable in upper and lower respiratory specimens  during the acute phase of infection. The expected result is Negative. Fact Sheet for Patients:  GolfingFamily.no Fact Sheet for Healthcare Providers: https://www.hernandez-brewer.com/ This test is not yet approved or cleared by the [place] FDA and has been authorized for detection and/or diagnosis of SARS CoV 2 by FDA under an Emergency Use Authorization (EUA).  This EUA will remain in effect (meaning this test can be used) for the duration of the COVID19 d eclaration under Section 564(b)(1) of the Act, 21 U.S.C. section [phone] 3(b)(1), unless the authorization is terminated or revoked sooner. Performed at [facility], [address], [place], [place] [address]   MRSA PCR Screening     Status: None   Collection Time: 05/04/19  6:16 PM   Specimen: Nasal Mucosa; Nasopharyngeal  Result Value Ref Range Status  MRSA by PCR NEGATIVE NEGATIVE Final    Comment:        The GeneXpert MRSA Assay (FDA approved for NASAL specimens only), is one component of a comprehensive MRSA colonization surveillance program. It is not intended to diagnose MRSA infection nor to guide or monitor treatment for MRSA infections. Performed at [facility], [place] [address], [place], [place] [address]   SARS Coronavirus 2 (CEPHEID - Performed in [place] hospital lab), Hosp Order     Status: None   Collection Time: 05/04/19  6:16 PM   Specimen: Nasal Mucosa; Nasopharyngeal  Result Value Ref Range Status   SARS Coronavirus 2 NEGATIVE NEGATIVE Final    Comment: (NOTE) If result is NEGATIVE SARS-CoV-2 target nucleic acids are NOT DETECTED. The SARS-CoV-2 RNA is generally detectable in upper and lower  respiratory specimens during the acute phase of infection. The lowest  concentration of SARS-CoV-2 viral copies this assay can detect is 250  copies / mL. A negative result does not preclude SARS-CoV-2 infection  and should not be used as  the sole basis for treatment or other  patient management decisions.  A negative result may occur with  improper specimen collection / handling, submission of specimen other  than nasopharyngeal swab, presence of viral mutation(s) within the  areas targeted by this assay, and inadequate number of viral copies  (<250 copies / mL). A negative result must be combined with clinical  observations, patient history, and epidemiological information. If result is POSITIVE SARS-CoV-2 target nucleic acids are DETECTED. The SARS-CoV-2 RNA is generally detectable in upper and lower  respiratory specimens dur ing the acute phase of infection.  Positive  results are indicative of active infection with SARS-CoV-2.  Clinical  correlation with patient history and other diagnostic information is  necessary to determine patient infection status.  Positive results do  not rule out bacterial infection or co-infection with other viruses. If result is PRESUMPTIVE POSTIVE SARS-CoV-2 nucleic acids MAY BE PRESENT.   A presumptive positive result was obtained on the submitted specimen  and confirmed on repeat testing.  While 2019 novel coronavirus  (SARS-CoV-2) nucleic acids may be present in the submitted sample  additional confirmatory testing may be necessary for epidemiological  and / or clinical management purposes  to differentiate between  SARS-CoV-2 and other Sarbecovirus currently known to infect humans.  If clinically indicated additional testing with an alternate test  methodology [phone]) is advised. The SARS-CoV-2 RNA is generally  detectable in upper and lower respiratory sp ecimens during the acute  phase of infection. The expected result is Negative. Fact Sheet for Patients:  StrictlyIdeas.no Fact Sheet for Healthcare Providers: BankingDealers.co.za This test is not yet approved or cleared by the [place] FDA and has been authorized for  detection and/or diagnosis of SARS-CoV-2 by FDA under an Emergency Use Authorization (EUA).  This EUA will remain in effect (meaning this test can be used) for the duration of the COVID-19 declaration under Section 564(b)(1) of the Act, 21 U.S.C. section 360bbb-3(b)(1), unless the authorization is terminated or revoked sooner. Performed at [facility], [place] [address] [place], [place] [address]   Culture, blood (routine x 2)     Status: None (Preliminary result)   Collection Time: 05/04/19  6:31 PM   Specimen: Site Not Specified; Blood  Result Value Ref Range Status   Specimen Description   Final    SITE NOT SPECIFIED Performed at [facility] [address]  [place], [place] [address]    Special Requests   Final    BOTTLES DRAWN AEROBIC ONLY Blood Culture adequate volume Performed at [facility] [address] [place], [place] [address]    Culture   Final    NO GROWTH < 12 HOURS Performed at [facility] [address] [place], [place] [address]    Report Status PENDING  Incomplete  Culture, blood (routine x 2)     Status: None (Preliminary result)   Collection Time: 05/04/19  6:31 PM   Specimen: Site Not Specified; Blood  Result Value Ref Range Status   Specimen Description   Final    SITE NOT SPECIFIED Performed at [facility] [address], [place], [place] [address]    Special Requests   Final    BOTTLES DRAWN AEROBIC ONLY Blood Culture adequate volume Performed at [facility] [address] [place], [place] [address]    Culture   Final    NO GROWTH < 12 HOURS Performed at [facility] [address], [place], [place] [address]    Report Status PENDING  Incomplete     Labs: Basic Metabolic Panel: Recent Labs  Lab 05/04/19 1123 05/04/19 1222 05/04/19 1831 05/04/19 2120 05/05/19 0205 05/05/19 0622  NA 133* 133* 137 138 138 139  K 5.1 5.2*  3.7 3.8 4.1 3.7  CL 96*  --  108 111 109 112*  CO2 9*  --  19* '22 22 24  ' GLUCOSE 675*  --  270* 178* 117* 111*  BUN 50*  --  40* 33* 28* 22*  CREATININE 2.03*  --  1.16 1.02 0.94 0.83  CALCIUM 9.1  --  8.4* 7.9* 8.5* 8.4*   Liver Function Tests: Recent Labs  Lab 05/04/19 1123 05/05/19 0205  AST 16 14*  ALT 26 17  ALKPHOS 130* 82  BILITOT 1.6* 0.5  PROT 8.2* 5.9*  ALBUMIN 4.9 3.6   Recent Labs  Lab 05/04/19 1831  LIPASE 19   No results for input(s): AMMONIA in the last 168 hours. CBC: Recent Labs  Lab 05/04/19 1123 05/04/19 1222 05/04/19 1941 05/05/19 0205  WBC 19.6*  --  15.3* 13.9*  NEUTROABS 17.0*  --   --  9.4*  HGB 15.6 16.3 12.1* 12.4*  HCT 49.4 48.0 36.4* 37.5*  MCV 94.3  --  91.2 91.7  PLT 280  --  183 169   Cardiac Enzymes: No results for input(s): CKTOTAL, CKMB, CKMBINDEX, TROPONINI in the last 168 hours. BNP: BNP (last 3 results) No results for input(s): BNP in the last 8760 hours.  ProBNP (last 3 results) No results for input(s): PROBNP in the last 8760 hours.  CBG: Recent Labs  Lab 05/05/19 0303 05/05/19 0440 05/05/19 0556 05/05/19 0650 05/05/19 1105  GLUCAP 82 87 109* 112* 391*       Signed:  Florencia Reasons MD, PhD  [facility] 05/05/2019, 7:45 PM

## 2019-05-05 NOTE — Progress Notes (Signed)
Inpatient Diabetes Program Recommendations  AACE/ADA: New Consensus Statement on Inpatient Glycemic Control (2015)  Target Ranges:  Prepandial:   less than 140 mg/dL      Peak postprandial:   less than 180 mg/dL (1-2 hours)      Critically ill patients:  140 - 180 mg/dL   Lab Results  Component Value Date   GLUCAP 112 (H) 05/05/2019   HGBA1C 12.4 (H) 05/04/2019    Review of Glycemic Control  Diabetes history: DM2 Outpatient Diabetes medications: Toujeo 33 units QHS, Humalog 10-20 units tidwc Current orders for Inpatient glycemic control: Lantus 10 units QD, Novolog 0-9 units tidwc and hs  HgbA1C - 12.4% Endo - Dr Cheri Kearns with pt regarding his HgbA1C (12.4% on ) and explained that current A1C indicates an average glucose of 309 mg/dl over the past 2-3 months. Discussed glucose and A1C goals. Discussed importance of checking CBGs and maintaining good CBG control to prevent long-term and short-term complications. Pt states he always checks it in the morning, but only checks occasionally before/after meals. Pt said he has hypoglycemia at times, usuallly at 2-3 am while sleeping. Treats with food. Instructed to treat with 15 g CHO (1/2 c juice or soda) and recheck in 15 min. Was previously on V-Go pump, and HgbA1C was was around 7.5%.Changed from [place] to [facility] and V-Go pump has been denied. Endo has contacted insurance co and RN has given pt [employer] phone number to call and inquire as to why the pump is not covered on his policy. Long discussion regarding need to f/u with Endo. Last visit was 06/10/18. Instructed pt to take meter to appt for MD to review and adjust insulin as needed.  Inpatient Diabetes Program Recommendations:   (For discharge)  Lantus 15 units bid Humalog 10 units tidwc ac  Pt to call for appt with endo this week.  Thank you. Lorenda Peck, RD, LDN, CDE Inpatient Diabetes Coordinator [phone]

## 2019-05-05 NOTE — Progress Notes (Signed)
Pt given discharge instructions and all questions answered. Sent home via private vehicle.

## 2019-05-09 LAB — CULTURE, BLOOD (ROUTINE X 2)
Culture: NO GROWTH
Culture: NO GROWTH
Special Requests: ADEQUATE
Special Requests: ADEQUATE

## 2020-08-29 ENCOUNTER — Other Ambulatory Visit: Payer: Self-pay

## 2020-08-29 ENCOUNTER — Emergency Department (HOSPITAL_BASED_OUTPATIENT_CLINIC_OR_DEPARTMENT_OTHER): Payer: Medicare Other

## 2020-08-29 ENCOUNTER — Inpatient Hospital Stay (HOSPITAL_BASED_OUTPATIENT_CLINIC_OR_DEPARTMENT_OTHER)
Admission: EM | Admit: 2020-08-29 | Discharge: 2020-09-03 | DRG: 637 | Disposition: A | Discharge status: Home / Self Care | Payer: Medicare Other | Attending: Family Medicine | Admitting: Family Medicine

## 2020-08-29 DIAGNOSIS — F319 Bipolar disorder, unspecified: Secondary | ICD-10-CM | POA: Diagnosis present

## 2020-08-29 DIAGNOSIS — F17210 Nicotine dependence, cigarettes, uncomplicated: Secondary | ICD-10-CM | POA: Diagnosis present

## 2020-08-29 DIAGNOSIS — N179 Acute kidney failure, unspecified: Secondary | ICD-10-CM | POA: Diagnosis present

## 2020-08-29 DIAGNOSIS — I10 Essential (primary) hypertension: Secondary | ICD-10-CM | POA: Diagnosis present

## 2020-08-29 DIAGNOSIS — G9341 Metabolic encephalopathy: Secondary | ICD-10-CM | POA: Diagnosis present

## 2020-08-29 DIAGNOSIS — F1120 Opioid dependence, uncomplicated: Secondary | ICD-10-CM | POA: Diagnosis present

## 2020-08-29 DIAGNOSIS — Z20822 Contact with and (suspected) exposure to covid-19: Secondary | ICD-10-CM | POA: Diagnosis present

## 2020-08-29 DIAGNOSIS — K219 Gastro-esophageal reflux disease without esophagitis: Secondary | ICD-10-CM | POA: Diagnosis present

## 2020-08-29 DIAGNOSIS — E785 Hyperlipidemia, unspecified: Secondary | ICD-10-CM | POA: Diagnosis present

## 2020-08-29 DIAGNOSIS — E10649 Type 1 diabetes mellitus with hypoglycemia without coma: Secondary | ICD-10-CM | POA: Diagnosis not present

## 2020-08-29 DIAGNOSIS — Z794 Long term (current) use of insulin: Secondary | ICD-10-CM

## 2020-08-29 DIAGNOSIS — E1010 Type 1 diabetes mellitus with ketoacidosis without coma: Principal | ICD-10-CM | POA: Diagnosis present

## 2020-08-29 DIAGNOSIS — Z79899 Other long term (current) drug therapy: Secondary | ICD-10-CM

## 2020-08-29 DIAGNOSIS — E1110 Type 2 diabetes mellitus with ketoacidosis without coma: Secondary | ICD-10-CM | POA: Diagnosis present

## 2020-08-29 DIAGNOSIS — I441 Atrioventricular block, second degree: Secondary | ICD-10-CM | POA: Diagnosis present

## 2020-08-29 DIAGNOSIS — E869 Volume depletion, unspecified: Secondary | ICD-10-CM | POA: Diagnosis present

## 2020-08-29 LAB — I-STAT VENOUS BLOOD GAS, ED
Acid-base deficit: 12 mmol/L — ABNORMAL HIGH (ref 0.0–2.0)
Bicarbonate: 14.3 mmol/L — ABNORMAL LOW (ref 20.0–28.0)
Calcium, Ion: 1.14 mmol/L — ABNORMAL LOW (ref 1.15–1.40)
HCT: 47 % (ref 39.0–52.0)
Hemoglobin: 16 g/dL (ref 13.0–17.0)
O2 Saturation: 61 %
Patient temperature: 98.4
Potassium: 4.4 mmol/L (ref 3.5–5.1)
Sodium: 133 mmol/L — ABNORMAL LOW (ref 135–145)
TCO2: 15 mmol/L — ABNORMAL LOW (ref 22–32)
pCO2, Ven: 32.4 mmHg — ABNORMAL LOW (ref 44.0–60.0)
pH, Ven: 7.253 (ref 7.250–7.430)
pO2, Ven: 36 mmHg (ref 32.0–45.0)

## 2020-08-29 LAB — LIPASE, BLOOD: Lipase: 18 U/L (ref 11–51)

## 2020-08-29 LAB — CBC
HCT: 44.4 % (ref 39.0–52.0)
Hemoglobin: 14.9 g/dL (ref 13.0–17.0)
MCH: 29.5 pg (ref 26.0–34.0)
MCHC: 33.6 g/dL (ref 30.0–36.0)
MCV: 87.9 fL (ref 80.0–100.0)
Platelets: 317 10*3/uL (ref 150–400)
RBC: 5.05 MIL/uL (ref 4.22–5.81)
RDW: 12.7 % (ref 11.5–15.5)
WBC: 14.4 10*3/uL — ABNORMAL HIGH (ref 4.0–10.5)
nRBC: 0 % (ref 0.0–0.2)

## 2020-08-29 LAB — CBG MONITORING, ED
Glucose-Capillary: 382 mg/dL — ABNORMAL HIGH (ref 70–99)
Glucose-Capillary: 448 mg/dL — ABNORMAL HIGH (ref 70–99)
Glucose-Capillary: 461 mg/dL — ABNORMAL HIGH (ref 70–99)

## 2020-08-29 LAB — PROTIME-INR
INR: 1 (ref 0.8–1.2)
Prothrombin Time: 13.1 seconds (ref 11.4–15.2)

## 2020-08-29 LAB — SALICYLATE LEVEL: Salicylate Lvl: <7 mg/dL — ABNORMAL LOW (ref 7.0–30.0)

## 2020-08-29 LAB — COMPREHENSIVE METABOLIC PANEL
ALT: 43 U/L (ref 0–44)
AST: 32 U/L (ref 15–41)
Albumin: 5 g/dL (ref 3.5–5.0)
Alkaline Phosphatase: 105 U/L (ref 38–126)
Anion gap: 29 — ABNORMAL HIGH (ref 5–15)
BUN: 28 mg/dL — ABNORMAL HIGH (ref 6–20)
CO2: 12 mmol/L — ABNORMAL LOW (ref 22–32)
Calcium: 9.7 mg/dL (ref 8.9–10.3)
Chloride: 91 mmol/L — ABNORMAL LOW (ref 98–111)
Creatinine, Ser: 1.68 mg/dL — ABNORMAL HIGH (ref 0.61–1.24)
GFR, Estimated: 48 mL/min — ABNORMAL LOW (ref >60)
Glucose, Bld: 476 mg/dL — ABNORMAL HIGH (ref 70–99)
Potassium: 4.7 mmol/L (ref 3.5–5.1)
Sodium: 132 mmol/L — ABNORMAL LOW (ref 135–145)
Total Bilirubin: 1.4 mg/dL — ABNORMAL HIGH (ref 0.3–1.2)
Total Protein: 7.7 g/dL (ref 6.5–8.1)

## 2020-08-29 LAB — LACTIC ACID, PLASMA: Lactic Acid, Venous: 3 mmol/L (ref 0.5–1.9)

## 2020-08-29 LAB — RESPIRATORY PANEL BY RT PCR (FLU A&B, COVID)
Influenza A by PCR: NEGATIVE
Influenza B by PCR: NEGATIVE
SARS Coronavirus 2 by RT PCR: NEGATIVE

## 2020-08-29 LAB — TROPONIN I (HIGH SENSITIVITY): Troponin I (High Sensitivity): 7 ng/L (ref <18)

## 2020-08-29 LAB — ETHANOL: Alcohol, Ethyl (B): <10 mg/dL (ref <10)

## 2020-08-29 LAB — ACETAMINOPHEN LEVEL: Acetaminophen (Tylenol), Serum: <10 ug/mL — ABNORMAL LOW (ref 10–30)

## 2020-08-29 LAB — APTT: aPTT: 23 seconds — ABNORMAL LOW (ref 24–36)

## 2020-08-29 MED ORDER — FAMOTIDINE IN NACL 20-0.9 MG/50ML-% IV SOLN
20.0000 mg | Freq: Two times a day (BID) | INTRAVENOUS | Status: DC
  Administered 2020-08-30 (×3): 20 mg via INTRAVENOUS
  Filled 2020-08-29 (×3): qty 50

## 2020-08-29 MED ORDER — DEXTROSE 50 % IV SOLN: 0.0000 mL | INTRAVENOUS | Status: DC | PRN

## 2020-08-29 MED ORDER — POTASSIUM CHLORIDE 10 MEQ/100ML IV SOLN
10.0000 meq | INTRAVENOUS | Status: AC
  Administered 2020-08-29 – 2020-08-30 (×2): 10 meq via INTRAVENOUS
  Filled 2020-08-29 (×2): qty 100

## 2020-08-29 MED ORDER — ONDANSETRON HCL 4 MG/2ML IJ SOLN
INTRAMUSCULAR | Status: AC
  Filled 2020-08-29: qty 2

## 2020-08-29 MED ORDER — ONDANSETRON HCL 4 MG/2ML IJ SOLN
4.0000 mg | Freq: Once | INTRAMUSCULAR | Status: AC
  Administered 2020-08-29: 4 mg via INTRAVENOUS

## 2020-08-29 MED ORDER — DEXTROSE IN LACTATED RINGERS 5 % IV SOLN: INTRAVENOUS | Status: DC

## 2020-08-29 MED ORDER — LACTATED RINGERS IV BOLUS
1500.0000 mL | Freq: Once | INTRAVENOUS | Status: AC
  Administered 2020-08-30: 500 mL via INTRAVENOUS

## 2020-08-29 MED ORDER — LACTATED RINGERS IV SOLN: INTRAVENOUS | Status: DC

## 2020-08-29 MED ORDER — LACTATED RINGERS IV BOLUS
20.0000 mL/kg | Freq: Once | INTRAVENOUS | Status: DC
  Administered 2020-08-29: 1000 mL via INTRAVENOUS

## 2020-08-29 MED ORDER — INSULIN REGULAR(HUMAN) IN NACL 100-0.9 UT/100ML-% IV SOLN
INTRAVENOUS | Status: DC
  Administered 2020-08-29 – 2020-08-30 (×2): 8 [IU]/h via INTRAVENOUS
  Filled 2020-08-29: qty 100

## 2020-08-29 MED ORDER — LACTATED RINGERS IV BOLUS: 15000.0000 mL | Freq: Once | INTRAVENOUS | Status: DC

## 2020-08-29 MED ORDER — SODIUM CHLORIDE 0.9 % IV BOLUS
1000.0000 mL | Freq: Once | INTRAVENOUS | Status: AC
  Administered 2020-08-29: 1000 mL via INTRAVENOUS

## 2020-08-29 NOTE — ED Provider Notes (Signed)
[facility] Emergency Department Provider Note MRN:  [id number]  Arrival date & time: 08/29/20     Chief Complaint   Chest Pain and Emesis   History of Present Illness   Timothy MiresJeffrey L Perris Jr. is a 44 y.o. year-old male with a history of diabetes, polysubstance abuse, bipolar disorder presenting to the ED with chief complaint of altered mental status.  Altered, vomiting in the waiting room, not responsive to voice. Complaining of chest pain.  I was unable to obtain an accurate HPI, PMH, or ROS due to the patient's altered mental status.  Level 5 caveat.  Review of Systems  Positive for altered mental status.  Patient's Health History    Past Medical History:  Diagnosis Date  . Allergy   . Anxiety   . Arthritis    cervical- disc disease   . Bipolar depression (HCC)   . Chronic neck pain   . Chronic pain disorder    Sees [facility]  . Depression   . Diabetes mellitus    Type II  . Drug-seeking behavior   . GERD (gastroesophageal reflux disease)   . Hyperlipidemia   . Hypertension   . Hypogonadism   . Opiate dependence, continuous (HCC)   . Pneumonia    11[facility]  . Polysubstance abuse [facility](HCC)   . Urinary incontinence    detrusor instability    Past Surgical History:  Procedure Laterality Date  . ANTERIOR CERVICAL DECOMP/DISCECTOMY FUSION N/A 12/21/2015   Procedure: Cervical four-five, Cervical five-six Anterior Cervical Discectomy Fusion;  Surgeon: Tia Alertavid S Jones, MD;  Location: [facility] NEURO ORS;  Service: Neurosurgery;  Laterality: N/A;  . APPENDECTOMY  2002  . CARDIAC CATHETERIZATION  2006   done due to pain in his chest, no blockage seen  . HERNIA REPAIR  2002   abdominal  . NASAL SINUS SURGERY  2008    Family History  Problem Relation Age of Onset  . Diabetes Mother   . Hypertension Mother   . Cirrhosis Mother   . Depression Mother   . Bipolar disorder Mother   . Arthritis Father 49       osteoarthritis   . Bipolar disorder Father   . Heart disease Paternal Uncle [id number]       MI  . Heart disease Maternal Grandfather        late 60's--MI  . Cancer Paternal Grandmother [id number]       lung  . Bipolar disorder Paternal Grandmother   . Heart disease Paternal Grandfather [id number]       MI  . Bipolar disorder Paternal Grandfather   . Diabetes Sister        borderline    Social History   Socioeconomic History  . Marital status: Legally Separated    Spouse name: Not on file  . Number of children: 2  . Years of education: Not on file  . Highest education level: Not on file  Occupational History  . Occupation: Disabled    Associate Professormployer: UNEMPLOYED    Comment: chronic pain  Tobacco Use  . Smoking status: Current Every Day Smoker    Packs/day: 1.50    Years: 25.00    Pack years: 37.50    Types: Cigarettes  . Smokeless tobacco: Never Used  Substance and Sexual Activity  . Alcohol use: No    Alcohol/week: 0.0 standard drinks  . Drug use: No  . Sexual activity: Not Currently    Partners: Female  Other Topics  Concern  . Not on file  Social History Narrative  . Not on file   Social Determinants of Health   Financial Resource Strain:   . Difficulty of Paying Living Expenses: Not on file  Food Insecurity:   . Worried About Programme researcher, broadcasting/film/video in the Last Year: Not on file  . Ran Out of Food in the Last Year: Not on file  Transportation Needs:   . Lack of Transportation (Medical): Not on file  . Lack of Transportation (Non-Medical): Not on file  Physical Activity:   . Days of Exercise per Week: Not on file  . Minutes of Exercise per Session: Not on file  Stress:   . Feeling of Stress : Not on file  Social Connections:   . Frequency of Communication with Friends and Family: Not on file  . Frequency of Social Gatherings with Friends and Family: Not on file  . Attends Religious Services: Not on file  . Active Member of Clubs or Organizations: Not on file  . Attends Banker Meetings:  Not on file  . Marital Status: Not on file  Intimate Partner Violence:   . Fear of Current or Ex-Partner: Not on file  . Emotionally Abused: Not on file  . Physically Abused: Not on file  . Sexually Abused: Not on file     Physical Exam   Vitals:   08/29/20 2130 08/29/20 2200  BP: 119/68 122/72  Pulse: (!) 106 (!) 114  Resp: 11 15  Temp:    SpO2: 97% 99%    CONSTITUTIONAL: Chronically ill-appearing, NAD NEURO: Somnolent, wakes to voice occasionally, moves all extremities EYES:  eyes equal and reactive ENT/NECK:  no LAD, no JVD CARDIO: Regular rate, well-perfused, normal S1 and S2 PULM:  CTAB no wheezing or rhonchi GI/GU:  normal bowel sounds, non-distended, non-tender MSK/SPINE:  No gross deformities, no edema SKIN:  no rash, atraumatic PSYCH:  Appropriate speech and behavior  *Additional and/or pertinent findings included in MDM below  Diagnostic and Interventional Summary    EKG Interpretation  Date/Time:  Wednesday August 29 2020 21:08:52 EDT Ventricular Rate:  94 PR Interval:    QRS Duration: 89 QT Interval:  351 QTC Calculation: 439 R Axis:   70 Text Interpretation: Sinus rhythm Right atrial enlargement Confirmed by Kennis Carina [phone]) on 08/29/2020 9:30:25 PM      Labs Reviewed  APTT - Abnormal; Notable for the following components:      Result Value   aPTT 23 (*)    All other components within normal limits  ACETAMINOPHEN LEVEL - Abnormal; Notable for the following components:   Acetaminophen (Tylenol), Serum <10 (*)    All other components within normal limits  SALICYLATE LEVEL - Abnormal; Notable for the following components:   Salicylate Lvl <7.0 (*)    All other components within normal limits  CBC - Abnormal; Notable for the following components:   WBC 14.4 (*)    All other components within normal limits  COMPREHENSIVE METABOLIC PANEL - Abnormal; Notable for the following components:   Sodium 132 (*)    Chloride 91 (*)    CO2 12 (*)     Glucose, Bld 476 (*)    BUN 28 (*)    Creatinine, Ser 1.68 (*)    Total Bilirubin 1.4 (*)    GFR, Estimated 48 (*)    Anion gap 29 (*)    All other components within normal limits  LACTIC ACID, PLASMA - Abnormal; Notable for  the following components:   Lactic Acid, Venous 3.0 (*)    All other components within normal limits  CBG MONITORING, ED - Abnormal; Notable for the following components:   Glucose-Capillary 461 (*)    All other components within normal limits  I-STAT VENOUS BLOOD GAS, ED - Abnormal; Notable for the following components:   pCO2, Ven 32.4 (*)    Bicarbonate 14.3 (*)    TCO2 15 (*)    Acid-base deficit 12.0 (*)    Sodium 133 (*)    Calcium, Ion 1.14 (*)    All other components within normal limits  RESPIRATORY PANEL BY RT PCR (FLU A&B, COVID)  PROTIME-INR  ETHANOL  LIPASE, BLOOD  URINALYSIS, ROUTINE W REFLEX MICROSCOPIC  RAPID URINE DRUG SCREEN, HOSP PERFORMED  BETA-HYDROXYBUTYRIC ACID  BETA-HYDROXYBUTYRIC ACID  CBG MONITORING, ED  TROPONIN I (HIGH SENSITIVITY)    DG Chest Port 1 View  Final Result    CT HEAD WO CONTRAST  Final Result      Medications  lactated ringers bolus 20 mL/kg (has no administration in time range)  insulin regular, human (MYXREDLIN) 100 units/ 100 mL infusion (has no administration in time range)  lactated ringers infusion (has no administration in time range)  dextrose 5 % in lactated ringers infusion (has no administration in time range)  dextrose 50 % solution 0-50 mL (has no administration in time range)  potassium chloride 10 mEq in 100 mL IVPB (has no administration in time range)  ondansetron (ZOFRAN) injection 4 mg (4 mg Intravenous Given 08/29/20 2115)  sodium chloride 0.9 % bolus 1,000 mL (0 mLs Intravenous Stopped 08/29/20 2231)     Procedures  /  Critical Care .Critical Care Performed by: Sabas Sous, MD Authorized by: Sabas Sous, MD   Critical care provider statement:    Critical care time  (minutes):  35   Critical care was necessary to treat or prevent imminent or life-threatening deterioration of the following conditions:  Metabolic crisis (Diabetic ketoacidosis)   Critical care was time spent personally by me on the following activities:  Discussions with consultants, evaluation of patient's response to treatment, examination of patient, ordering and performing treatments and interventions, ordering and review of laboratory studies, ordering and review of radiographic studies, pulse oximetry, re-evaluation of patient's condition, obtaining history from patient or surrogate and review of old charts    ED Course and Medical Decision Making  I have reviewed the triage vital signs, the nursing notes, and pertinent available records from the EMR.  Listed above are laboratory and imaging tests that I personally ordered, reviewed, and interpreted and then considered in my medical decision making (see below for details).  Considering metabolic disarray, DKA, drug overdose, CNS lesion or bleeding, work-up pending. Currently with overall reassuring vital signs, protecting airway, on room air. Labs reveal metabolic acidosis.     Labs overall consistent with DKA, CT head obtained given patient's fairly deep somnolence, it is reassuring.  Will admit to hospitalist service, stepdown unit for further care, starting insulin drip.  Elmer Sow. Pilar Plate, MD [facility] Emergency Medicine [facility] [email or web address]  Final Clinical Impressions(s) / ED Diagnoses     ICD-10-CM   1. Diabetic ketoacidosis without coma associated with type 2 diabetes mellitus (HCC)  E11.10     ED Discharge Orders    None       Discharge Instructions Discussed with and Provided to Patient:   Discharge Instructions   None  Sabas Sous, MD 08/29/20 2238

## 2020-08-29 NOTE — Progress Notes (Signed)
VBG results shown to Dr. Pilar Plate.

## 2020-08-29 NOTE — ED Notes (Signed)
Pt sleepy, awakens easily. Denies chest pain. Skin warm and dry. VSS. No emesis. Suction at bedside.

## 2020-08-29 NOTE — ED Triage Notes (Signed)
Pt arrived with son? Vomit x1 bile/brownish with some food in waiting area upon arrival, pt arousal  to pain only, non verbal, pale, diaphoretic

## 2020-08-30 ENCOUNTER — Other Ambulatory Visit: Payer: Self-pay

## 2020-08-30 ENCOUNTER — Encounter (HOSPITAL_COMMUNITY): Payer: Self-pay | Admitting: Family Medicine

## 2020-08-30 DIAGNOSIS — E785 Hyperlipidemia, unspecified: Secondary | ICD-10-CM | POA: Diagnosis present

## 2020-08-30 DIAGNOSIS — F1120 Opioid dependence, uncomplicated: Secondary | ICD-10-CM | POA: Diagnosis present

## 2020-08-30 DIAGNOSIS — I441 Atrioventricular block, second degree: Secondary | ICD-10-CM | POA: Diagnosis present

## 2020-08-30 DIAGNOSIS — G9341 Metabolic encephalopathy: Secondary | ICD-10-CM | POA: Diagnosis present

## 2020-08-30 DIAGNOSIS — E1010 Type 1 diabetes mellitus with ketoacidosis without coma: Principal | ICD-10-CM

## 2020-08-30 DIAGNOSIS — F319 Bipolar disorder, unspecified: Secondary | ICD-10-CM | POA: Diagnosis present

## 2020-08-30 DIAGNOSIS — R001 Bradycardia, unspecified: Secondary | ICD-10-CM | POA: Diagnosis not present

## 2020-08-30 DIAGNOSIS — Z79899 Other long term (current) drug therapy: Secondary | ICD-10-CM | POA: Diagnosis not present

## 2020-08-30 DIAGNOSIS — N179 Acute kidney failure, unspecified: Secondary | ICD-10-CM | POA: Diagnosis present

## 2020-08-30 DIAGNOSIS — E869 Volume depletion, unspecified: Secondary | ICD-10-CM | POA: Diagnosis present

## 2020-08-30 DIAGNOSIS — F17210 Nicotine dependence, cigarettes, uncomplicated: Secondary | ICD-10-CM | POA: Diagnosis present

## 2020-08-30 DIAGNOSIS — E10649 Type 1 diabetes mellitus with hypoglycemia without coma: Secondary | ICD-10-CM | POA: Diagnosis not present

## 2020-08-30 DIAGNOSIS — I10 Essential (primary) hypertension: Secondary | ICD-10-CM | POA: Diagnosis present

## 2020-08-30 DIAGNOSIS — K219 Gastro-esophageal reflux disease without esophagitis: Secondary | ICD-10-CM | POA: Diagnosis present

## 2020-08-30 DIAGNOSIS — Z794 Long term (current) use of insulin: Secondary | ICD-10-CM | POA: Diagnosis not present

## 2020-08-30 DIAGNOSIS — Z20822 Contact with and (suspected) exposure to covid-19: Secondary | ICD-10-CM | POA: Diagnosis present

## 2020-08-30 LAB — BASIC METABOLIC PANEL
Anion gap: 13 (ref 5–15)
Anion gap: 8 (ref 5–15)
Anion gap: 11 (ref 5–15)
Anion gap: 12 (ref 5–15)
BUN: 24 mg/dL — ABNORMAL HIGH (ref 6–20)
BUN: 24 mg/dL — ABNORMAL HIGH (ref 6–20)
BUN: 20 mg/dL (ref 6–20)
BUN: 19 mg/dL (ref 6–20)
CO2: 22 mmol/L (ref 22–32)
CO2: 24 mmol/L (ref 22–32)
CO2: 22 mmol/L (ref 22–32)
CO2: 24 mmol/L (ref 22–32)
Calcium: 9.2 mg/dL (ref 8.9–10.3)
Calcium: 9.1 mg/dL (ref 8.9–10.3)
Calcium: 8.6 mg/dL — ABNORMAL LOW (ref 8.9–10.3)
Calcium: 9 mg/dL (ref 8.9–10.3)
Chloride: 100 mmol/L (ref 98–111)
Chloride: 103 mmol/L (ref 98–111)
Chloride: 102 mmol/L (ref 98–111)
Chloride: 98 mmol/L (ref 98–111)
Creatinine, Ser: 1.03 mg/dL (ref 0.61–1.24)
Creatinine, Ser: 1.05 mg/dL (ref 0.61–1.24)
Creatinine, Ser: 0.88 mg/dL (ref 0.61–1.24)
Creatinine, Ser: 1.14 mg/dL (ref 0.61–1.24)
GFR, Estimated: >60 mL/min (ref >60)
GFR, Estimated: >60 mL/min (ref >60)
GFR, Estimated: >60 mL/min (ref >60)
GFR, Estimated: >60 mL/min (ref >60)
Glucose, Bld: 162 mg/dL — ABNORMAL HIGH (ref 70–99)
Glucose, Bld: 146 mg/dL — ABNORMAL HIGH (ref 70–99)
Glucose, Bld: 78 mg/dL (ref 70–99)
Glucose, Bld: 162 mg/dL — ABNORMAL HIGH (ref 70–99)
Potassium: 4.2 mmol/L (ref 3.5–5.1)
Potassium: 5.1 mmol/L (ref 3.5–5.1)
Potassium: 3.7 mmol/L (ref 3.5–5.1)
Potassium: 3.5 mmol/L (ref 3.5–5.1)
Sodium: 135 mmol/L (ref 135–145)
Sodium: 135 mmol/L (ref 135–145)
Sodium: 135 mmol/L (ref 135–145)
Sodium: 134 mmol/L — ABNORMAL LOW (ref 135–145)

## 2020-08-30 LAB — URINALYSIS, ROUTINE W REFLEX MICROSCOPIC
Bilirubin Urine: NEGATIVE
Glucose, UA: >=500 mg/dL — AB
Hgb urine dipstick: NEGATIVE
Ketones, ur: >80 mg/dL — AB
Leukocytes,Ua: NEGATIVE
Nitrite: NEGATIVE
Protein, ur: NEGATIVE mg/dL
Specific Gravity, Urine: >1.03 — ABNORMAL HIGH (ref 1.005–1.030)
pH: 6 (ref 5.0–8.0)

## 2020-08-30 LAB — CBC
HCT: 38.4 % — ABNORMAL LOW (ref 39.0–52.0)
Hemoglobin: 13.2 g/dL (ref 13.0–17.0)
MCH: 30.6 pg (ref 26.0–34.0)
MCHC: 34.4 g/dL (ref 30.0–36.0)
MCV: 88.9 fL (ref 80.0–100.0)
Platelets: 255 10*3/uL (ref 150–400)
RBC: 4.32 MIL/uL (ref 4.22–5.81)
RDW: 12.4 % (ref 11.5–15.5)
WBC: 13.6 10*3/uL — ABNORMAL HIGH (ref 4.0–10.5)
nRBC: 0 % (ref 0.0–0.2)

## 2020-08-30 LAB — GLUCOSE, CAPILLARY
Glucose-Capillary: 270 mg/dL — ABNORMAL HIGH (ref 70–99)
Glucose-Capillary: 204 mg/dL — ABNORMAL HIGH (ref 70–99)
Glucose-Capillary: 154 mg/dL — ABNORMAL HIGH (ref 70–99)
Glucose-Capillary: 152 mg/dL — ABNORMAL HIGH (ref 70–99)
Glucose-Capillary: 141 mg/dL — ABNORMAL HIGH (ref 70–99)
Glucose-Capillary: 153 mg/dL — ABNORMAL HIGH (ref 70–99)
Glucose-Capillary: 147 mg/dL — ABNORMAL HIGH (ref 70–99)
Glucose-Capillary: 146 mg/dL — ABNORMAL HIGH (ref 70–99)
Glucose-Capillary: 98 mg/dL (ref 70–99)
Glucose-Capillary: 81 mg/dL (ref 70–99)
Glucose-Capillary: 138 mg/dL — ABNORMAL HIGH (ref 70–99)
Glucose-Capillary: 238 mg/dL — ABNORMAL HIGH (ref 70–99)

## 2020-08-30 LAB — URINALYSIS, MICROSCOPIC (REFLEX): WBC, UA: NONE SEEN WBC/hpf (ref 0–5)

## 2020-08-30 LAB — CBG MONITORING, ED: Glucose-Capillary: 329 mg/dL — ABNORMAL HIGH (ref 70–99)

## 2020-08-30 LAB — RAPID URINE DRUG SCREEN, HOSP PERFORMED
Amphetamines: NOT DETECTED
Barbiturates: NOT DETECTED
Benzodiazepines: NOT DETECTED
Cocaine: NOT DETECTED
Opiates: NOT DETECTED
Tetrahydrocannabinol: NOT DETECTED

## 2020-08-30 LAB — TROPONIN I (HIGH SENSITIVITY): Troponin I (High Sensitivity): 9 ng/L (ref <18)

## 2020-08-30 LAB — MRSA PCR SCREENING: MRSA by PCR: NEGATIVE

## 2020-08-30 LAB — HIV ANTIBODY (ROUTINE TESTING W REFLEX): HIV Screen 4th Generation wRfx: NONREACTIVE

## 2020-08-30 LAB — BETA-HYDROXYBUTYRIC ACID
Beta-Hydroxybutyric Acid: >8 mmol/L — ABNORMAL HIGH (ref 0.05–0.27)
Beta-Hydroxybutyric Acid: 2.46 mmol/L — ABNORMAL HIGH (ref 0.05–0.27)
Beta-Hydroxybutyric Acid: 1.84 mmol/L — ABNORMAL HIGH (ref 0.05–0.27)

## 2020-08-30 MED ORDER — INSULIN ASPART 100 UNIT/ML ~~LOC~~ SOLN
0.0000 [IU] | Freq: Three times a day (TID) | SUBCUTANEOUS | Status: DC
  Administered 2020-08-30: 1 [IU] via SUBCUTANEOUS
  Administered 2020-08-31: 3 [IU] via SUBCUTANEOUS
  Administered 2020-08-31: 2 [IU] via SUBCUTANEOUS

## 2020-08-30 MED ORDER — ACETAMINOPHEN 500 MG PO TABS
1000.0000 mg | ORAL_TABLET | Freq: Three times a day (TID) | ORAL | Status: DC | PRN
  Administered 2020-08-30 – 2020-08-31 (×2): 1000 mg via ORAL
  Filled 2020-08-30 (×2): qty 2

## 2020-08-30 MED ORDER — CHLORHEXIDINE GLUCONATE CLOTH 2 % EX PADS
6.0000 | MEDICATED_PAD | Freq: Every day | CUTANEOUS | Status: DC
  Administered 2020-08-30: 6 via TOPICAL

## 2020-08-30 MED ORDER — DEXTROSE 50 % IV SOLN: 0.0000 mL | INTRAVENOUS | Status: DC | PRN

## 2020-08-30 MED ORDER — PROSOURCE PLUS PO LIQD
30.0000 mL | Freq: Three times a day (TID) | ORAL | Status: DC
  Administered 2020-08-30 – 2020-09-03 (×7): 30 mL via ORAL
  Filled 2020-08-30 (×8): qty 30

## 2020-08-30 MED ORDER — DEXTROSE IN LACTATED RINGERS 5 % IV SOLN: INTRAVENOUS | Status: DC

## 2020-08-30 MED ORDER — BUPRENORPHINE HCL-NALOXONE HCL 8-2 MG SL SUBL
1.0000 | SUBLINGUAL_TABLET | Freq: Every day | SUBLINGUAL | Status: DC
  Administered 2020-08-30: 1 via SUBLINGUAL
  Filled 2020-08-30: qty 1

## 2020-08-30 MED ORDER — ORAL CARE MOUTH RINSE: 15.0000 mL | Freq: Two times a day (BID) | OROMUCOSAL | Status: DC

## 2020-08-30 MED ORDER — CHLORHEXIDINE GLUCONATE 0.12 % MT SOLN
15.0000 mL | Freq: Two times a day (BID) | OROMUCOSAL | Status: DC
  Administered 2020-08-30 – 2020-09-03 (×8): 15 mL via OROMUCOSAL
  Filled 2020-08-30 (×7): qty 15

## 2020-08-30 MED ORDER — LACTATED RINGERS IV SOLN: INTRAVENOUS | Status: DC

## 2020-08-30 MED ORDER — INSULIN REGULAR(HUMAN) IN NACL 100-0.9 UT/100ML-% IV SOLN: INTRAVENOUS | Status: DC

## 2020-08-30 MED ORDER — BUPRENORPHINE HCL-NALOXONE HCL 8-2 MG SL SUBL
1.0000 | SUBLINGUAL_TABLET | Freq: Three times a day (TID) | SUBLINGUAL | Status: DC
  Administered 2020-08-30 – 2020-09-03 (×11): 1 via SUBLINGUAL
  Filled 2020-08-30 (×11): qty 1

## 2020-08-30 MED ORDER — KATE FARMS STANDARD 1.4 PO LIQD
325.0000 mL | Freq: Every day | ORAL | Status: DC
  Administered 2020-09-01 – 2020-09-02 (×2): 325 mL via ORAL
  Filled 2020-08-30 (×5): qty 325

## 2020-08-30 MED ORDER — TRAZODONE HCL 100 MG PO TABS
100.0000 mg | ORAL_TABLET | Freq: Every day | ORAL | Status: DC
  Administered 2020-08-30 – 2020-09-02 (×4): 100 mg via ORAL
  Filled 2020-08-30 (×4): qty 1

## 2020-08-30 MED ORDER — INSULIN GLARGINE 100 UNIT/ML ~~LOC~~ SOLN
15.0000 [IU] | SUBCUTANEOUS | Status: DC
  Administered 2020-08-30: 15 [IU] via SUBCUTANEOUS
  Filled 2020-08-30 (×2): qty 0.15

## 2020-08-30 MED ORDER — INSULIN ASPART 100 UNIT/ML ~~LOC~~ SOLN
0.0000 [IU] | Freq: Every day | SUBCUTANEOUS | Status: DC
  Administered 2020-08-30: 2 [IU] via SUBCUTANEOUS

## 2020-08-30 MED ORDER — INSULIN ASPART 100 UNIT/ML ~~LOC~~ SOLN
4.0000 [IU] | Freq: Three times a day (TID) | SUBCUTANEOUS | Status: DC
  Administered 2020-08-30 – 2020-09-03 (×11): 4 [IU] via SUBCUTANEOUS

## 2020-08-30 MED ORDER — BOOST / RESOURCE BREEZE PO LIQD CUSTOM: 1.0000 | Freq: Three times a day (TID) | ORAL | Status: DC

## 2020-08-30 MED ORDER — ENOXAPARIN SODIUM 40 MG/0.4ML ~~LOC~~ SOLN
40.0000 mg | SUBCUTANEOUS | Status: DC
  Administered 2020-08-31 – 2020-09-03 (×4): 40 mg via SUBCUTANEOUS
  Filled 2020-08-30 (×5): qty 0.4

## 2020-08-30 MED ORDER — ADULT MULTIVITAMIN W/MINERALS CH
1.0000 | ORAL_TABLET | Freq: Every day | ORAL | Status: DC
  Administered 2020-08-31 – 2020-09-03 (×4): 1 via ORAL
  Filled 2020-08-30 (×4): qty 1

## 2020-08-30 MED FILL — Fentanyl Citrate Preservative Free (PF) Inj 100 MCG/2ML: INTRAMUSCULAR | Qty: 2 | Status: AC

## 2020-08-30 NOTE — Progress Notes (Signed)
PROGRESS NOTE    [name].  GYK:[id number] DOB: [birth year] DOA: 08/29/2020 PCP: Bobbye Riggs, NP   Chief Complaint  Patient presents with  . Chest Pain  . Emesis    Brief Narrative [name]. is [name] 44 y.o. male with history of diabetes mellitus type 1, chronic pain history of polysubstance abuse presents to the ER admits in [facility] with complaint of nausea vomiting over the last 2 to 3 days.  Patient states he usually is on [name] insulin pump but last week was not able to get [name] refill on his cartridges and he had to go back on [place] and his blood sugars were fluctuating and was not able to keep in good control.  Patient started developing nausea vomiting and diarrhea over the last 3 days and was unable to keep anything.  Denies any chest pain or shortness of breath.  ED Course: In the ER patient was tachycardic with labs showing bicarb of 12 creatinine 1.6 anion gap of 29 blood glucose of 476 urine drug screen was negative chest x-ray unremarkable EKG showing sinus tachycardia patient is admitted for diabetic ketoacidosis for which patient was started on insulin infusion fluids.  In the ER patient was found to be mildly confused and Kennesha Brewbaker CT head was done which was unremarkable.  By the time I examined the patient patient is alert awake and oriented to time place and person.  Assessment & Plan:   Principal Problem:   DKA (diabetic ketoacidosis) (HCC) Active Problems:   AKI (acute kidney injury) (HCC)  1. Diabetic ketoacidosis 1. Notes insurance stopped paying for pods for his pump and he's had issues transitioning to basal/bolus regimen 2. Gap closed, BG's improved, start basal bolus regimen and adjust as needed 3. Follow A1c 4. Diabetic coordinator    2. Nausea  vomiting unclear etiology, continue to follow.  W/u additionally as needed.  Improved at this time.  3. Acute renal failure likely from volume depletion - improved after IVF  4. History of chronic pain   Hx Polysubstance Abuse on buprenorphine.  5. Acute metabolic encephalopathy likely 2/2 DKA.  Urine drug screen negative.  CT head unremarkable.  DVT prophylaxis: lovenox Code Status: full  Family Communication: none at bedside Disposition:   Status is: Inpatient  Remains inpatient appropriate because:Inpatient level of care appropriate due to severity of illness   Dispo: The patient is from: Home              Anticipated d/c is to: Home              Anticipated d/c date is: 1 day              Patient currently is not medically stable to d/c.  Consultants:   none  Procedures:  none  Antimicrobials:  Anti-infectives (From admission, onward)   None        Subjective: No new complaints  Objective: Vitals:   08/30/20 0600 08/30/20 0800 08/30/20 1200 08/30/20 1553  BP: 127/66   129/81  Pulse: 87   70  Resp:    14  Temp:  (!) 97 F (36.1 C) 97.7 F (36.5 C) 98 F (36.7 C)  TempSrc:  Oral Oral   SpO2: 98%   100%  Weight:      Height:        Intake/Output Summary (Last 24 hours) at 08/30/2020 1707 Last data filed at 08/30/2020 1500 Gross per 24 hour  Intake 3063.64 ml  Output 400 ml  Net 2663.64 ml   Filed Weights   08/29/20 2314 08/30/20 0135  Weight: 74.3 kg 71.1 kg    Examination:  General exam: Appears calm and comfortable  Respiratory system: Clear to auscultation. Respiratory effort normal. Cardiovascular system: S1 & S2 heard, RRR. Gastrointestinal system: Abdomen is nondistended, soft and nontender Central nervous system: Alert and oriented. No focal neurological deficits. Extremities: no LEE Skin: No rashes, lesions or ulcers Psychiatry: Judgement and insight appear normal. Mood & affect appropriate.     Data Reviewed: I have personally reviewed following labs and imaging studies  CBC: Recent Labs  Lab 08/29/20 2118 08/29/20 2119 08/30/20 0500  WBC 14.4*  --  13.6*  HGB 14.9 16.0 13.2  HCT 44.4 47.0 38.4*  MCV 87.9  --  88.9   PLT 317  --  255    Basic Metabolic Panel: Recent Labs  Lab 08/29/20 2118 08/29/20 2118 08/29/20 2119 08/30/20 0500 08/30/20 0812 08/30/20 1232 08/30/20 1620  NA 132*   < > 133* 135 135 135 134*  K 4.7   < > 4.4 4.2 5.1 3.7 3.5  CL 91*  --   --  100 103 102 98  CO2 12*  --   --  22 24 22 24   GLUCOSE 476*  --   --  162* 146* 78 162*  BUN 28*  --   --  24* 24* 20 19  CREATININE 1.68*  --   --  1.03 1.05 0.88 1.14  CALCIUM 9.7  --   --  9.2 9.1 8.6* 9.0   < > = values in this interval not displayed.    GFR: Estimated Creatinine Clearance: 82.3 mL/min (by C-G formula based on SCr of 1.14 mg/dL).  Liver Function Tests: Recent Labs  Lab 08/29/20 2118  AST 32  ALT 43  ALKPHOS 105  BILITOT 1.4*  PROT 7.7  ALBUMIN 5.0    CBG: Recent Labs  Lab 08/30/20 0751 08/30/20 0853 08/30/20 1020 08/30/20 1134 08/30/20 1637  GLUCAP 147* 146* 98 81 138*     Recent Results (from the past 240 hour(s))  Respiratory Panel by RT PCR (Flu Asser Lucena&B, Covid) - Nasopharyngeal Swab     Status: None   Collection Time: 08/29/20  9:39 PM   Specimen: Nasopharyngeal Swab  Result Value Ref Range Status   SARS Coronavirus 2 by RT PCR NEGATIVE NEGATIVE Final    Comment: (NOTE) SARS-CoV-2 target nucleic acids are NOT DETECTED.  The SARS-CoV-2 RNA is generally detectable in upper respiratoy specimens during the acute phase of infection. The lowest concentration of SARS-CoV-2 viral copies this assay can detect is 131 copies/mL. Morrison Mcbryar negative result does not preclude SARS-Cov-2 infection and should not be used as the sole basis for treatment or other patient management decisions. Flois Mctague negative result may occur with  improper specimen collection/handling, submission of specimen other than nasopharyngeal swab, presence of viral mutation(s) within the areas targeted by this assay, and inadequate number of viral copies (<131 copies/mL). Kylin Dubs negative result must be combined with clinical observations,  patient history, and epidemiological information. The expected result is Negative.  Fact Sheet for Patients:  08/31/20  Fact Sheet for Healthcare Providers:  https://www.moore.com/  This test is no t yet approved or cleared by the https://www.young.biz/ FDA and  has been authorized for detection and/or diagnosis of SARS-CoV-2 by FDA under an Emergency Use Authorization (EUA). This EUA will remain  in effect (meaning this test can be used) for the  duration of the COVID-19 declaration under Section 564(b)(1) of the Act, 21 U.S.C. section 360bbb-3(b)(1), unless the authorization is terminated or revoked sooner.     Influenza Majesty Oehlert by PCR NEGATIVE NEGATIVE Final   Influenza B by PCR NEGATIVE NEGATIVE Final    Comment: (NOTE) The Xpert Xpress SARS-CoV-2/FLU/RSV assay is intended as an aid in  the diagnosis of influenza from Nasopharyngeal swab specimens and  should not be used as Thyra Yinger sole basis for treatment. Nasal washings and  aspirates are unacceptable for Xpert Xpress SARS-CoV-2/FLU/RSV  testing.  Fact Sheet for Patients: https://www.moore.com/  Fact Sheet for Healthcare Providers: https://www.young.biz/  This test is not yet approved or cleared by the [place] FDA and  has been authorized for detection and/or diagnosis of SARS-CoV-2 by  FDA under an Emergency Use Authorization (EUA). This EUA will remain  in effect (meaning this test can be used) for the duration of the  Covid-19 declaration under Section 564(b)(1) of the Act, 21  U.S.C. section 360bbb-3(b)(1), unless the authorization is  terminated or revoked. Performed at [facility], [address] [place], [place] [address]   MRSA PCR Screening     Status: None   Collection Time: 08/30/20  1:27 AM   Specimen: Nasal Mucosa; Nasopharyngeal  Result Value Ref Range Status   MRSA by PCR NEGATIVE NEGATIVE Final    Comment:         The GeneXpert MRSA Assay (FDA approved for NASAL specimens only), is one component of [place] comprehensive MRSA colonization surveillance program. It is not intended to diagnose MRSA infection nor to guide or monitor treatment for MRSA infections. Performed at [facility], [address], [place], [place] [address]          Radiology Studies: CT HEAD WO CONTRAST  Result Date: 08/29/2020 CLINICAL DATA:  Altered mental status. EXAM: CT HEAD WITHOUT CONTRAST TECHNIQUE: Contiguous axial images were obtained from the base of the skull through the vertex without intravenous contrast. COMPARISON:  July 14, 2017 FINDINGS: Brain: No evidence of acute infarction, hemorrhage, hydrocephalus, extra-axial collection or mass lesion/mass effect. Vascular: No hyperdense vessel or unexpected calcification. Skull: Normal. Negative for fracture or focal lesion. Sinuses/Orbits: There is mild left ethmoid sinus mucosal thickening. Valor Quaintance 1.5 cm x 1.2 cm polyp versus mucous retention cyst is seen within the posterior aspect of the left maxillary sinus. This is present on the prior study. Other: None. IMPRESSION: 1. No acute intracranial abnormality. 2. Mild left ethmoid sinus disease. 3. 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 Electronically Signed   By: Aram Candela M.D.   On: 08/29/2020 21:32   DG Chest Port 1 View  Result Date: 08/29/2020 CLINICAL DATA:  Altered mental status EXAM: PORTABLE CHEST 1 VIEW COMPARISON:  05/04/2019 FINDINGS: The heart size and mediastinal contours are within normal limits. Both lungs are clear. The visualized skeletal structures are unremarkable. IMPRESSION: No active disease. Electronically Signed   By: Alcide Clever M.D.   On: 08/29/2020 21:32        Scheduled Meds: . (feeding supplement) PROSource Plus  30 mL Oral TID BM  . buprenorphine-naloxone  1 tablet Sublingual Daily  . chlorhexidine  15 mL Mouth Rinse BID  .  Chlorhexidine Gluconate Cloth  6 each Topical Daily  . enoxaparin (LOVENOX) injection  40 mg Subcutaneous Q24H  . feeding supplement (KATE [place]AR[place] STANDARD 1.4)  325 mL Oral Daily  . insulin aspart  0-5 Units Subcutaneous QHS  .  insulin aspart  0-9 Units Subcutaneous TID WC  . insulin aspart  4 Units Subcutaneous TID WC  . insulin glargine  15 Units Subcutaneous Q24H  . multivitamin with minerals  1 tablet Oral Daily   Continuous Infusions: . famotidine (PEPCID) IV Stopped (08/30/20 1009)  . lactated ringers Stopped (08/30/20 1600)     LOS: 0 days    Time spent: over 30 min    Lacretia Nicksaldwell Powell, MD [facility]   To contact the attending provider between 7A-7P or the covering provider during after hours 7P-7A, please log into the web site [email or web address] and access using universal [place] password for that web site. If you do not have the password, please call the hospital operator.  08/30/2020, 5:07 PM

## 2020-08-30 NOTE — Progress Notes (Signed)
Initial Nutrition Assessment  RD working remotely.  DOCUMENTATION CODES:   Not applicable  INTERVENTION:  - will order [employer] 1.4 po once/day, each supplement provides 455 kcal and 20 grams protein.  - will order 30 ml Prosource Plus BID, each supplement provides 100 kcal and 15 grams protein.  - will order 1 tablet multivitamin with minerals/day.    NUTRITION DIAGNOSIS:   Increased nutrient needs related to acute illness as evidenced by estimated needs.  GOAL:   Patient will meet greater than or equal to 90% of their needs  MONITOR:   PO intake, Supplement acceptance, Labs, Weight trends  REASON FOR ASSESSMENT:   Malnutrition Screening Tool  ASSESSMENT:   44 y.o. male with medical history of type 1 DM, chronic pain, depression, GERD, HLD, bipolar disorder, drug-seeking behavior, HTN, anxiety, arthritis, and polysubstance abuse. He presented to the ED due to N/V/D x2-3 days with inability to keep anything down. He usually uses an insulin pump but last week was unable to get a refill on his cartridges.  Diet advanced from NPO to CLD today at 0423 and then to Carb Modified at 0912. Documentation shows 0% of breakfast at 0900.   Weight today is 157 lb and weight yesterday documented as 164 lb. Weight at [facility] on 08/23/20 was 165 lb. Prior to that time, the most recently documented weight in the chart is from 05/04/19 when he weighed 167 lb.  Patient was seen by DM Coordinate this AM.   Per notes: - DKA thought to be 2/2 inability to obtain refills for insulin pump - N/V which may be 2/2 gastroparesis - ARF 2/2 dehydration - acute encephalopathy--resolved - UDS was negative   Labs reviewed; CBGs: 81- 153 mg/dl, BUN: 24 mg/dl. Medications reviewed; 20 mg IV pepcid BID, sliding scale novolog, 4 units novolog TID, 15 units lantus/day, 10 mEq IV KCl x2 runs 10/20.  IVF; D5-LR @ 125 ml/hr (510 kcal)    NUTRITION - FOCUSED PHYSICAL EXAM:  unable to complete at this  time.   Diet Order:   Diet Order            Diet Carb Modified Fluid consistency: Thin; Room service appropriate? Yes  Diet effective now                 EDUCATION NEEDS:   Not appropriate for education at this time  Skin:  Skin Assessment: Reviewed RN Assessment  Last BM:  PTA/unknown  Height:   Ht Readings from Last 1 Encounters:  08/30/20 6\' 1"  (1.854 m)    Weight:   Wt Readings from Last 1 Encounters:  08/30/20 71.1 kg    Ideal Body Weight:     BMI:  Body mass index is 20.68 kg/m.  Estimated Nutritional Needs:   Kcal:  2250-2500 kcal  Protein:  115-125 grams  Fluid:  >/= 2.5 L/day     09/01/20, MS, RD, LDN, CNSC Inpatient Clinical Dietitian RD pager # available in AMION  After hours/weekend pager # available in [facility]

## 2020-08-30 NOTE — ED Notes (Signed)
Called floor to give report Nurse in a room with a code left name and number to return call

## 2020-08-30 NOTE — TOC Initial Note (Signed)
Transition of Care (TOC) - Initial/Assessment Note    Patient Details  Name: [name]. MRN: [id number] Date of Birth: 1976-03-03  Transition of Care [facility]) CM/SW Contact:    Golda Acre, RN Phone Number: 08/30/2020, 8:52 AM  Clinical Narrative:                 HPI: Ruthvik Barnaby. is a 44 y.o. male with history of diabetes mellitus type 1, chronic pain history of polysubstance abuse presents to the ER admits in [facility] with complaint of nausea vomiting over the last 2 to 3 days.  Patient states he usually is on a insulin pump but last week was not able to get a refill on his cartridges and he had to go back on [place] and his blood sugars were fluctuating and was not able to keep in good control.  Patient started developing nausea vomiting and diarrhea over the last 3 days and was unable to keep anything.  Denies any chest pain or shortness of breath.  ED Course: In the ER patient was tachycardic with labs showing bicarb of 12 creatinine 1.6 anion gap of 29 blood glucose of 476 urine drug screen was negative chest x-ray unremarkable EKG showing sinus tachycardia patient is admitted for diabetic ketoacidosis for which patient was started on insulin infusion fluids.  In the ER patient was found to be mildly confused and a CT head was done which was unremarkable Plan is for patient to return to home, need to get new insulin pump following for progression and needs. Expected Discharge Plan: Home/Self Care Barriers to Discharge: Barriers Unresolved (comment)   Patient Goals and CMS Choice Patient states their goals for this hospitalization and ongoing recovery are:: to go home      Expected Discharge Plan and Services Expected Discharge Plan: Home/Self Care   Discharge Planning Services: CM Consult   Living arrangements for the past 2 months: Single Family Home                                      Prior Living Arrangements/Services Living  arrangements for the past 2 months: Single Family Home Lives with:: Self Patient language and need for interpreter reviewed:: Yes Do you feel safe going back to the place where you live?: Yes      Need for Family Participation in Patient Care: Yes (Comment) Care giver support system in place?: Yes (comment)   Criminal Activity/Legal Involvement Pertinent to Current Situation/Hospitalization: No - Comment as needed  Activities of Daily Living Home Assistive Devices/Equipment: CBG Meter ADL Screening (condition at time of admission) Patient's cognitive ability adequate to safely complete daily activities?: Yes Is the patient deaf or have difficulty hearing?: No Does the patient have difficulty seeing, even when wearing glasses/contacts?: No Does the patient have difficulty concentrating, remembering, or making decisions?: No Patient able to express need for assistance with ADLs?: Yes Does the patient have difficulty dressing or bathing?: No Independently performs ADLs?: Yes (appropriate for developmental age) Does the patient have difficulty walking or climbing stairs?: No Weakness of Legs: None Weakness of Arms/Hands: None  Permission Sought/Granted                  Emotional Assessment Appearance:: Appears stated age Attitude/Demeanor/Rapport: Engaged Affect (typically observed): Calm Orientation: : Oriented to Place, Oriented to Self, Oriented to  Time, Oriented to Situation Alcohol / Substance  Use: Not Applicable Psych Involvement: No (comment)  Admission diagnosis:  DKA (diabetic ketoacidosis) (HCC) [E11.10] Diabetic ketoacidosis without coma associated with type 2 diabetes mellitus (HCC) [E11.10] Patient Active Problem List   Diagnosis Date Noted  . DKA (diabetic ketoacidosis) (HCC) 08/29/2020  . DKA (diabetic ketoacidoses) 05/04/2019  . AKI (acute kidney injury) (HCC)   . Insulin dependent diabetes mellitus   . S/P cervical spinal fusion 12/21/2015  . Bipolar 1  disorder, depressed (HCC) 11/15/2015  . Bipolar disorder with depression (HCC) 07/31/2015  . Insomnia   . Sinusitis, acute maxillary 02/06/2015  . Headache syndrome 02/06/2015  . Atypical pneumonia 01/18/2015  . Renal mass 12/20/2014  . Lung nodule 12/07/2014  . Muscle spasms of neck 10/17/2014  . Bipolar I disorder, most recent episode depressed (HCC)   . Alcohol dependence with alcohol-induced mood disorder (HCC)   . MDD (major depressive disorder), severe (HCC) 09/19/2014  . Hyponatremia 08/15/2014  . Abscess 08/15/2014  . Loss of weight 05/26/2014  . Leukocytosis 05/26/2014  . Rash and nonspecific skin eruption 04/21/2014  . Unspecified episodic mood disorder 03/21/2014  . Alcohol dependence (HCC) 03/21/2014  . Alcohol abuse 03/20/2014  . Cocaine abuse (HCC) 03/20/2014  . Neck pain 01/31/2014  . Rotator cuff dysfunction 11/13/2013  . Closed fracture of unspecified phalanx or phalanges of hand 10/04/2013  . Generalized anxiety disorder 08/30/2013  . Anxiety disorder 08/29/2013  . Admitted with dehydration 08/29/2013  . Gastroenteritis 08/29/2013  . Malnutrition of mild degree (HCC) 08/29/2013  . Major depressive disorder, recurrent episode, severe (HCC) 07/14/2013  . HYPOGONADISM 08/28/2009  . 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 Diabetes type 2, uncontrolled (HCC) 09/30/2006  . HYPERLIPIDEMIA 09/30/2006  . TOBACCO ABUSE 09/30/2006  . Essential hypertension 09/30/2006  . ALLERGIC RHINITIS 09/30/2006  . COPD (chronic obstructive pulmonary disease) (HCC) 09/30/2006  . GERD 09/30/2006  . DEGENERATIVE DISC DISEASE, LUMBAR SPINE 09/30/2006  . Backache 09/30/2006  . FIBROMYALGIA 09/30/2006  . FATTY LIVER DISEASE, HX OF 09/30/2006   PCP:  Bobbye Riggs, NP Pharmacy:   [phone] - [place], [place] - [address] [place] [address] Phone: [phone] Fax: [phone]     Social Determinants of Health (SDOH) Interventions     Readmission Risk Interventions No flowsheet data found.

## 2020-08-30 NOTE — Progress Notes (Addendum)
Inpatient Diabetes Program Recommendations  AACE/ADA: New Consensus Statement on Inpatient Glycemic Control (2015)  Target Ranges:  Prepandial:   less than 140 mg/dL      Peak postprandial:   less than 180 mg/dL (1-2 hours)      Critically ill patients:  140 - 180 mg/dL   Lab Results  Component Value Date   GLUCAP 146 (H) 08/30/2020   HGBA1C 12.4 (H) 05/04/2019    Review of Glycemic Control  Diabetes history: DM1 Outpatient Diabetes medications: Insulin pump and back-up is Tresiba 25 units QHS and Humalog 10-20 units tidwc Current orders for Inpatient glycemic control: Transitioned off IV insulin per [facility] for DKA. Lantus 15 units Q24H, Novolog 0-9 units tidwc and hs + 4 units tidwc  HgbA1C - 12.4% - poor glycemic control  Spoke with pt about his Type 1 DM and HgbA1C of 12.4%. Pt states it was around 8% before his insurance stopped covering his pump supplies. On 10/14, pt received 10 pods for pump feom endo, and pt states they would not sync with his pump. He then started taking Tresiba 25 units and Humalog 10-20 units tid. Pt states he did not take any Humalog when he woke up Mon morning being nauseated with diarrhea. States he did not miss his [place]. Pt said Endo was contacting insurance company to see which pump they would cover. Checks his blood sugars 5-6x/day. Very frustrated with failure of insurance co to cover his insulin pump and supplies. Pt had no questions for me.  Inpatient Diabetes Program Recommendations:     Agree with orders.  Spoke with pt about his insulin pump and issues with getting cartridges for the pump.  Thank you. Ailene Ards, RD, LDN, CDE Inpatient Diabetes Coordinator [phone]

## 2020-08-30 NOTE — H&P (Addendum)
History and Physical    [name]. YKZ:[id number] DOB: 03/08/1976 DOA: 08/29/2020  PCP: [name], NP   Patient coming from: Home.  Chief Complaint: Nausea vomiting.  HPI: [name]. is a 44 y.o. male with history of diabetes mellitus type 1, chronic pain history of polysubstance abuse presents to the ER admits in [facility] with complaint of nausea vomiting over the last 2 to 3 days.  Patient states he usually is on a insulin pump but last week was not able to get a refill on his cartridges and he had to go back on [place] and his blood sugars were fluctuating and was not able to keep in good control.  Patient started developing nausea vomiting and diarrhea over the last 3 days and was unable to keep anything.  Denies any chest pain or shortness of breath.  ED Course: In the ER patient was tachycardic with labs showing bicarb of 12 creatinine 1.6 anion gap of 29 blood glucose of 476 urine drug screen was negative chest x-ray unremarkable EKG showing sinus tachycardia patient is admitted for diabetic ketoacidosis for which patient was started on insulin infusion fluids.  In the ER patient was found to be mildly confused and a CT head was done which was unremarkable.  By the time I examined the patient patient is alert awake and oriented to time place and person.  Review of Systems: As per HPI, rest all negative.   Past Medical History:  Diagnosis Date  . Allergy   . Anxiety   . Arthritis    cervical- disc disease   . Bipolar depression ([place])   . Chronic neck pain   . Chronic pain disorder    Sees [facility]  . Depression   . Diabetes mellitus    Type II  . Drug-seeking behavior   . GERD (gastroesophageal reflux disease)   . Hyperlipidemia   . Hypertension   . Hypogonadism   . Opiate dependence, continuous ([place])   . Pneumonia    11/2014- tx at [facility]  . Polysubstance abuse [facility])   . Urinary incontinence    detrusor instability    Past  Surgical History:  Procedure Laterality Date  . ANTERIOR CERVICAL DECOMP/DISCECTOMY FUSION N/A 12/21/2015   Procedure: Cervical four-five, Cervical five-six Anterior Cervical Discectomy Fusion;  Surgeon: Eustace Moore, MD;  Location: [place] [facility];  Service: Neurosurgery;  Laterality: N/A;  . APPENDECTOMY  2002  . CARDIAC CATHETERIZATION  2006   done due to pain in his chest, no blockage seen  . HERNIA REPAIR  2002   abdominal  . NASAL SINUS SURGERY  2008     reports that he has been smoking cigarettes. He has a 37.50 pack-year smoking history. He has never used smokeless tobacco. He reports that he does not drink alcohol and does not use drugs.  Allergies  Allergen Reactions  . Tramadol Other (See Comments)    SERATONIN SYNDROME DIZZINESS SERATONIN SYNDROME DIZZINESS SERATONIN SYNDROME DIZZINESS Causes seratonin syndrome  . Ciprofloxacin Hives    Family History  Problem Relation Age of Onset  . Diabetes Mother   . Hypertension Mother   . Cirrhosis Mother   . Depression Mother   . Bipolar disorder Mother   . Arthritis Father 31       osteoarthritis  . Bipolar disorder Father   . Heart disease Paternal Uncle 39       MI  . Heart disease Maternal Grandfather  late 60's--MI  . Cancer Paternal Grandmother 92       lung  . Bipolar disorder Paternal Grandmother   . Heart disease Paternal Grandfather 58       MI  . Bipolar disorder Paternal Grandfather   . Diabetes Sister        borderline    Prior to Admission medications   Medication Sig Start Date End Date Taking? Authorizing Provider  buprenorphine-naloxone (SUBOXONE) 8-2 mg SUBL SL tablet Place 1 tablet under the tongue in the morning, at noon, and at bedtime.    Yes [provider]  insulin degludec (TRESIBA FLEXTOUCH) 200 UNIT/ML FlexTouch Pen Inject 25 Units into the skin at bedtime.   Yes [provider]  insulin lispro (HUMALOG KWIKPEN) 100 UNIT/ML KwikPen Inject 10-20 Units into the  skin 3 (three) times daily.   Yes [provider]  traZODone (DESYREL) 100 MG tablet Take 100 mg by mouth at bedtime. 07/03/20  Yes [provider]  blood glucose meter kit and supplies KIT Dispense based on patient and insurance preference. Use up to four times daily as directed. (FOR ICD-9 250.00, 250.01). 05/05/19   Florencia Reasons, MD  insulin glargine (LANTUS) 100 UNIT/ML injection Inject 0.15 mLs (15 Units total) into the skin 2 (two) times daily. For diabetes management Patient not taking: Reported on 08/30/2020 05/05/19   Florencia Reasons, MD  albuterol (PROVENTIL) 90 MCG/ACT inhaler Inhale 2 puffs into the lungs every 6 (six) hours as needed for wheezing. 07/22/11 02/04/12  Debbrah Alar, NP    Physical Exam: Constitutional: Moderately built and nourished. Vitals:   08/30/20 0030 08/30/20 0038 08/30/20 0135 08/30/20 0300  BP: 130/83   123/63  Pulse: (!) 105   94  Resp: 15   10  Temp:  99.1 F (37.3 C) 98.9 F (37.2 C)   TempSrc:  Oral Oral   SpO2: 100%   98%  Weight:   71.1 kg   Height:   '6\' 1"'  (1.854 m)    Eyes: Anicteric no pallor. ENMT: No discharge from the ears eyes nose or mouth. Neck: No mass felt.  No neck rigidity. Respiratory: No rhonchi or crepitations. Cardiovascular: S1-S2 heard. Abdomen: Soft nontender bowel sounds present. Musculoskeletal: No edema. Skin: No rash. Neurologic: Alert awake oriented to time place and person.  Moves all extremities. Psychiatric: Appears normal.  Normal affect.   Labs on Admission: I have personally reviewed following labs and imaging studies  CBC: Recent Labs  Lab 08/29/20 2118 08/29/20 2119  WBC 14.4*  --   HGB 14.9 16.0  HCT 44.4 47.0  MCV 87.9  --   PLT 317  --    Basic Metabolic Panel: Recent Labs  Lab 08/29/20 2118 08/29/20 2119  NA 132* 133*  K 4.7 4.4  CL 91*  --   CO2 12*  --   GLUCOSE 476*  --   BUN 28*  --   CREATININE 1.68*  --   CALCIUM 9.7  --    GFR: Estimated Creatinine Clearance:  55.8 mL/min (A) (by C-G formula based on SCr of 1.68 mg/dL (H)). Liver Function Tests: Recent Labs  Lab 08/29/20 2118  AST 32  ALT 43  ALKPHOS 105  BILITOT 1.4*  PROT 7.7  ALBUMIN 5.0   Recent Labs  Lab 08/29/20 2118  LIPASE 18   No results for input(s): AMMONIA in the last 168 hours. Coagulation Profile: Recent Labs  Lab 08/29/20 2118  INR 1.0   Cardiac Enzymes: No results  for input(s): CKTOTAL, CKMB, CKMBINDEX, TROPONINI in the last 168 hours. BNP (last 3 results) No results for input(s): PROBNP in the last 8760 hours. HbA1C: No results for input(s): HGBA1C in the last 72 hours. CBG: Recent Labs  Lab 08/29/20 2310 08/29/20 2344 08/30/20 0042 08/30/20 0135 08/30/20 0311  GLUCAP 448* 382* 329* 270* 204*   Lipid Profile: No results for input(s): CHOL, HDL, LDLCALC, TRIG, CHOLHDL, LDLDIRECT in the last 72 hours. Thyroid Function Tests: No results for input(s): TSH, T4TOTAL, FREET4, T3FREE, THYROIDAB in the last 72 hours. Anemia Panel: No results for input(s): VITAMINB12, FOLATE, FERRITIN, TIBC, IRON, RETICCTPCT in the last 72 hours. Urine analysis:    Component Value Date/Time   COLORURINE YELLOW 08/29/2020 2345   APPEARANCEUR CLEAR 08/29/2020 2345   LABSPEC >1.030 (H) 08/29/2020 2345   PHURINE 6.0 08/29/2020 2345   GLUCOSEU >=500 (A) 08/29/2020 2345   GLUCOSEU NEGATIVE 08/11/2014 1202   HGBUR NEGATIVE 08/29/2020 2345   BILIRUBINUR NEGATIVE 08/29/2020 2345   KETONESUR >80 (A) 08/29/2020 2345   PROTEINUR NEGATIVE 08/29/2020 2345   UROBILINOGEN 0.2 04/28/2015 0820   NITRITE NEGATIVE 08/29/2020 2345   LEUKOCYTESUR NEGATIVE 08/29/2020 2345   Sepsis Labs: '@LABRCNTIP' (procalcitonin:4,lacticidven:4) ) Recent Results (from the past 240 hour(s))  Respiratory Panel by RT PCR (Flu A&B, Covid) - Nasopharyngeal Swab     Status: None   Collection Time: 08/29/20  9:39 PM   Specimen: Nasopharyngeal Swab  Result Value Ref Range Status   SARS Coronavirus 2 by RT PCR  NEGATIVE NEGATIVE Final    Comment: (NOTE) SARS-CoV-2 target nucleic acids are NOT DETECTED.  The SARS-CoV-2 RNA is generally detectable in upper respiratoy specimens during the acute phase of infection. The lowest concentration of SARS-CoV-2 viral copies this assay can detect is 131 copies/mL. A negative result does not preclude SARS-Cov-2 infection and should not be used as the sole basis for treatment or other patient management decisions. A negative result may occur with  improper specimen collection/handling, submission of specimen other than nasopharyngeal swab, presence of viral mutation(s) within the areas targeted by this assay, and inadequate number of viral copies (<131 copies/mL). A negative result must be combined with clinical observations, patient history, and epidemiological information. The expected result is Negative.  Fact Sheet for Patients:  PinkCheek.be  Fact Sheet for Healthcare Providers:  GravelBags.it  This test is no t yet approved or cleared by the [place] FDA and  has been authorized for detection and/or diagnosis of SARS-CoV-2 by FDA under an Emergency Use Authorization (EUA). This EUA will remain  in effect (meaning this test can be used) for the duration of the COVID-19 declaration under Section 564(b)(1) of the Act, 21 U.S.C. section 360bbb-3(b)(1), unless the authorization is terminated or revoked sooner.     Influenza A by PCR NEGATIVE NEGATIVE Final   Influenza B by PCR NEGATIVE NEGATIVE Final    Comment: (NOTE) The Xpert Xpress SARS-CoV-2/FLU/RSV assay is intended as an aid in  the diagnosis of influenza from Nasopharyngeal swab specimens and  should not be used as a sole basis for treatment. Nasal washings and  aspirates are unacceptable for Xpert Xpress SARS-CoV-2/FLU/RSV  testing.  Fact Sheet for Patients: PinkCheek.be  Fact Sheet for  Healthcare Providers: GravelBags.it  This test is not yet approved or cleared by the [place] FDA and  has been authorized for detection and/or diagnosis of SARS-CoV-2 by  FDA under an Emergency Use Authorization (EUA). This EUA will remain  in effect (meaning this test can  be used) for the duration of the  Covid-19 declaration under Section 564(b)(1) of the Act, 21  U.S.C. section 360bbb-3(b)(1), unless the authorization is  terminated or revoked. Performed at [facility], [address] [place], [place] [address]   MRSA PCR Screening     Status: None   Collection Time: 08/30/20  1:27 AM   Specimen: Nasal Mucosa; Nasopharyngeal  Result Value Ref Range Status   MRSA by PCR NEGATIVE NEGATIVE Final    Comment:        The GeneXpert MRSA Assay (FDA approved for NASAL specimens only), is one component of a comprehensive MRSA colonization surveillance program. It is not intended to diagnose MRSA infection nor to guide or monitor treatment for MRSA infections. Performed at [facility], [place] [address], [place], [place] [address]      Radiological Exams on Admission: CT HEAD WO CONTRAST  Result Date: 08/29/2020 CLINICAL DATA:  Altered mental status. EXAM: CT HEAD WITHOUT CONTRAST TECHNIQUE: Contiguous axial images were obtained from the base of the skull through the vertex without intravenous contrast. COMPARISON:  July 14, 2017 FINDINGS: Brain: No evidence of acute infarction, hemorrhage, hydrocephalus, extra-axial collection or mass lesion/mass effect. Vascular: No hyperdense vessel or unexpected calcification. Skull: Normal. Negative for fracture or focal lesion. Sinuses/Orbits: There is mild left ethmoid sinus mucosal thickening. A 1.5 cm x 1.2 cm polyp versus mucous retention cyst is seen within the posterior aspect of the left maxillary sinus. This is present on the prior study. Other: None. IMPRESSION: 1. No acute  intracranial abnormality. 2. Mild left ethmoid sinus disease. 3. 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 Electronically Signed   By: Virgina Norfolk M.D.   On: 08/29/2020 21:32   DG Chest Port 1 View  Result Date: 08/29/2020 CLINICAL DATA:  Altered mental status EXAM: PORTABLE CHEST 1 VIEW COMPARISON:  05/04/2019 FINDINGS: The heart size and mediastinal contours are within normal limits. Both lungs are clear. The visualized skeletal structures are unremarkable. IMPRESSION: No active disease. Electronically Signed   By: Inez Catalina M.D.   On: 08/29/2020 21:32    EKG: Independently reviewed.  Sinus tachycardia.  Assessment/Plan Principal Problem:   DKA (diabetic ketoacidosis) ([place]) Active Problems:   AKI (acute kidney injury) ([place])    1. Diabetic ketoacidosis likely from patient not able to take his medications due to switch from pump to regular long-acting subcutaneous insulin.  Hemoglobin A1c about 3 months ago was 12.  Present 1 is pending.  Patient is on IV fluids insulin closely follow metabolic panel. 2. Nausea vomiting could be from gastroparesis.  Abdomen appears benign.  LFTs unremarkable.  Continue to monitor.  Is able to tolerate ice chips at this time. 3. Acute renal failure likely from dehydration follow metabolic panel after hydration. 4. History of chronic pain on buprenorphine. 5. Acute encephalopathy at presentation essentially resolved could be from metabolic reasons.  Urine drug screen negative.  CT head unremarkable.  Since patient has significant acidosis with renal failure and DKA will need close monitoring for any further worsening in inpatient status.   DVT prophylaxis: Lovenox. Code Status: Full code. Family Communication: Discussed with patient. Disposition Plan: Home. Consults called: None. Admission status: Inpatient.   Rise Patience MD [facility] Pager [phone].  If 7PM-7AM, please contact  night-coverage [email or web address] Password [facility]  08/30/2020, 4:23 AM

## 2020-08-30 NOTE — Progress Notes (Signed)
Patient needs transition orders to get off the insulin drip. Notified Katherina Right NP. Insulin drip now running 1.1 units/hour. Will continue to monitor.

## 2020-08-31 DIAGNOSIS — E1010 Type 1 diabetes mellitus with ketoacidosis without coma: Secondary | ICD-10-CM | POA: Diagnosis not present

## 2020-08-31 LAB — CBC WITH DIFFERENTIAL/PLATELET
Abs Immature Granulocytes: 0.01 10*3/uL (ref 0.00–0.07)
Basophils Absolute: 0 10*3/uL (ref 0.0–0.1)
Basophils Relative: 1 %
Eosinophils Absolute: 0.2 10*3/uL (ref 0.0–0.5)
Eosinophils Relative: 3 %
HCT: 30.1 % — ABNORMAL LOW (ref 39.0–52.0)
Hemoglobin: 10.3 g/dL — ABNORMAL LOW (ref 13.0–17.0)
Immature Granulocytes: 0 %
Lymphocytes Relative: 32 %
Lymphs Abs: 1.7 10*3/uL (ref 0.7–4.0)
MCH: 30.4 pg (ref 26.0–34.0)
MCHC: 34.2 g/dL (ref 30.0–36.0)
MCV: 88.8 fL (ref 80.0–100.0)
Monocytes Absolute: 0.5 10*3/uL (ref 0.1–1.0)
Monocytes Relative: 10 %
Neutro Abs: 2.9 10*3/uL (ref 1.7–7.7)
Neutrophils Relative %: 54 %
Platelets: 167 10*3/uL (ref 150–400)
RBC: 3.39 MIL/uL — ABNORMAL LOW (ref 4.22–5.81)
RDW: 12.7 % (ref 11.5–15.5)
WBC: 5.4 10*3/uL (ref 4.0–10.5)
nRBC: 0 % (ref 0.0–0.2)

## 2020-08-31 LAB — GLUCOSE, CAPILLARY
Glucose-Capillary: 217 mg/dL — ABNORMAL HIGH (ref 70–99)
Glucose-Capillary: 200 mg/dL — ABNORMAL HIGH (ref 70–99)
Glucose-Capillary: 118 mg/dL — ABNORMAL HIGH (ref 70–99)
Glucose-Capillary: 160 mg/dL — ABNORMAL HIGH (ref 70–99)

## 2020-08-31 LAB — COMPREHENSIVE METABOLIC PANEL
ALT: 24 U/L (ref 0–44)
AST: 20 U/L (ref 15–41)
Albumin: 3.4 g/dL — ABNORMAL LOW (ref 3.5–5.0)
Alkaline Phosphatase: 63 U/L (ref 38–126)
Anion gap: 6 (ref 5–15)
BUN: 14 mg/dL (ref 6–20)
CO2: 26 mmol/L (ref 22–32)
Calcium: 8.5 mg/dL — ABNORMAL LOW (ref 8.9–10.3)
Chloride: 103 mmol/L (ref 98–111)
Creatinine, Ser: 0.8 mg/dL (ref 0.61–1.24)
GFR, Estimated: >60 mL/min (ref >60)
Glucose, Bld: 252 mg/dL — ABNORMAL HIGH (ref 70–99)
Potassium: 3.7 mmol/L (ref 3.5–5.1)
Sodium: 135 mmol/L (ref 135–145)
Total Bilirubin: 0.8 mg/dL (ref 0.3–1.2)
Total Protein: 5.4 g/dL — ABNORMAL LOW (ref 6.5–8.1)

## 2020-08-31 LAB — PHOSPHORUS: Phosphorus: 3 mg/dL (ref 2.5–4.6)

## 2020-08-31 LAB — HEMOGLOBIN A1C
Hgb A1c MFr Bld: 11.2 % — ABNORMAL HIGH (ref 4.8–5.6)
Mean Plasma Glucose: 275 mg/dL

## 2020-08-31 LAB — MAGNESIUM: Magnesium: 1.9 mg/dL (ref 1.7–2.4)

## 2020-08-31 MED ORDER — ONDANSETRON HCL 4 MG/2ML IJ SOLN
4.0000 mg | Freq: Three times a day (TID) | INTRAMUSCULAR | Status: DC | PRN
  Administered 2020-08-31: 4 mg via INTRAVENOUS
  Filled 2020-08-31: qty 2

## 2020-08-31 MED ORDER — POLYETHYLENE GLYCOL 3350 17 G PO PACK
17.0000 g | PACK | Freq: Every day | ORAL | Status: DC
  Administered 2020-08-31 – 2020-09-01 (×2): 17 g via ORAL
  Filled 2020-08-31 (×2): qty 1

## 2020-08-31 MED ORDER — SORBITOL 70 % SOLN
960.0000 mL | TOPICAL_OIL | Freq: Once | ORAL | Status: AC
  Administered 2020-08-31: 960 mL via RECTAL
  Filled 2020-08-31: qty 473

## 2020-08-31 MED ORDER — INSULIN GLARGINE 100 UNIT/ML ~~LOC~~ SOLN
20.0000 [IU] | SUBCUTANEOUS | Status: DC
  Administered 2020-08-31: 20 [IU] via SUBCUTANEOUS
  Filled 2020-08-31 (×2): qty 0.2

## 2020-08-31 MED ORDER — FAMOTIDINE 20 MG PO TABS
20.0000 mg | ORAL_TABLET | Freq: Two times a day (BID) | ORAL | Status: DC
  Administered 2020-08-31 – 2020-09-03 (×7): 20 mg via ORAL
  Filled 2020-08-31 (×7): qty 1

## 2020-08-31 NOTE — Evaluation (Signed)
Physical Therapy Evaluation Patient Details Name: [name]. MRN: [id number] DOB: 29-Sep-1976 Today's Date: 08/31/2020   History of Present Illness  44 yo male admitted with DKA, ARF. Hx of DM, chronic pain. bipolar, polysubstance abuse.  Clinical Impression  On eval, pt was Ind with mobility. He walked around the unit without difficulty. No c/o lightheadedness/dizziness. No PT needs. 1x eval. Will sign off.     Follow Up Recommendations No PT follow up    Equipment Recommendations  None recommended by PT    Recommendations for Other Services       Precautions / Restrictions Precautions Precautions: None Restrictions Weight Bearing Restrictions: No      Mobility  Bed Mobility Overal bed mobility: Independent                  Transfers Overall transfer level: Independent                  Ambulation/Gait Ambulation/Gait assistance: Independent Gait Distance (Feet): 200 Feet Assistive device: None Gait Pattern/deviations: WFL(Within Functional Limits)        Stairs            Wheelchair Mobility    Modified Rankin (Stroke Patients Only)       Balance Overall balance assessment: Independent                                           Pertinent Vitals/Pain      Home Living Family/patient expects to be discharged to:: Private residence Living Arrangements: Alone   Type of Home: Apartment Home Access: Level entry     Home Layout: One level Home Equipment: None      Prior Function Level of Independence: Independent               Hand Dominance        Extremity/Trunk Assessment   Upper Extremity Assessment Upper Extremity Assessment: Overall WFL for tasks assessed    Lower Extremity Assessment Lower Extremity Assessment: Overall WFL for tasks assessed    Cervical / Trunk Assessment Cervical / Trunk Assessment: Normal  Communication   Communication: No difficulties  Cognition  Arousal/Alertness: Awake/alert Behavior During Therapy: WFL for tasks assessed/performed Overall Cognitive Status: Within Functional Limits for tasks assessed                                        General Comments      Exercises     Assessment/Plan    PT Assessment Patent does not need any further PT services  PT Problem List         PT Treatment Interventions      PT Goals (Current goals can be found in the Care Plan section)  Acute Rehab PT Goals PT Goal Formulation: All assessment and education complete, DC therapy    Frequency     Barriers to discharge        Co-evaluation               AM-PAC PT "6 Clicks" Mobility  Outcome Measure Help needed turning from your back to your side while in a flat bed without using bedrails?: None Help needed moving from lying on your back to sitting on the side of a flat bed without using bedrails?: None  Help needed moving to and from a bed to a chair (including a wheelchair)?: None Help needed standing up from a chair using your arms (e.g., wheelchair or bedside chair)?: None Help needed to walk in hospital room?: None Help needed climbing 3-5 steps with a railing? : None 6 Click Score: 24    End of Session   Activity Tolerance: Patient tolerated treatment well          Time: 1300-1311 PT Time Calculation (min) (ACUTE ONLY): 11 min   Charges:   PT Evaluation $PT Eval Low Complexity: 1 Low            Faye Ramsay, PT Acute Rehabilitation  Office: [phone] Pager: [phone]

## 2020-08-31 NOTE — Progress Notes (Signed)
Patient given a smog enema. Tolerated well (500cc). RN will continue to monitor.

## 2020-08-31 NOTE — Progress Notes (Signed)
PROGRESS NOTE    [name].  JSH:[id number] DOB: [birth year] DOA: 08/29/2020 PCP: Bobbye Riggs, NP   Chief Complaint  Patient presents with  . Chest Pain  . Emesis    Brief Narrative [name]. is [name] 44 y.o. male with history of diabetes mellitus type 1, chronic pain history of polysubstance abuse presents to the ER admits in [facility] with complaint of nausea vomiting over the last 2 to 3 days.  Patient states he usually is on Yides Saidi insulin pump but last week was not able to get Jonise Weightman refill on his cartridges and he had to go back on [place] and his blood sugars were fluctuating and was not able to keep in good control.  Patient started developing nausea vomiting and diarrhea over the last 3 days and was unable to keep anything.  Denies any chest pain or shortness of breath.  ED Course: In the ER patient was tachycardic with labs showing bicarb of 12 creatinine 1.6 anion gap of 29 blood glucose of 476 urine drug screen was negative chest x-ray unremarkable EKG showing sinus tachycardia patient is admitted for diabetic ketoacidosis for which patient was started on insulin infusion fluids.  In the ER patient was found to be mildly confused and Benedetta Sundstrom CT head was done which was unremarkable.  By the time I examined the patient patient is alert awake and oriented to time place and person.  Assessment & Plan:   Principal Problem:   DKA (diabetic ketoacidosis) (HCC) Active Problems:   AKI (acute kidney injury) (HCC)  1. Secondary Degree AV block Type 2: noted on telemetry around 1518.  He was using bathroom around that time.  Cardiology has been consulted.  He's back in sinus rhythm.    2. Diabetic ketoacidosis 1. Notes insurance stopped paying for pods for his pump and he's had issues transitioning to basal/bolus regimen 2. Gap closed, BG's improved, continue basal bolus regimen and adjust as needed 3. Follow A1c 11.2 4. Diabetic coordinator    3. Nausea  vomiting unclear  etiology, continue to follow.  W/u additionally as needed.  Improved at this time.  4. Acute renal failure likely from volume depletion - improved after IVF  5. History of chronic pain  Hx Polysubstance Abuse on buprenorphine.  6. Acute metabolic encephalopathy likely 2/2 DKA.  Urine drug screen negative.  CT head unremarkable.  DVT prophylaxis: lovenox Code Status: full  Family Communication: none at bedside Disposition:   Status is: Inpatient  Remains inpatient appropriate because:Inpatient level of care appropriate due to severity of illness   Dispo: The patient is from: Home              Anticipated d/c is to: Home              Anticipated d/c date is: 1 day              Patient currently is not medically stable to d/c.  Consultants:   none  Procedures:  none  Antimicrobials:  Anti-infectives (From admission, onward)   None        Subjective: Feels tired, fatigued   Objective: Vitals:   08/30/20 2348 08/31/20 0351 08/31/20 0805 08/31/20 1348  BP: 108/73 95/67 (!) 98/58 130/84  Pulse: (!) 57 (!) 52 66 (!) 59  Resp: 15 15 18 18   Temp: 98.2 F (36.8 C) 97.7 F (36.5 C) 98.3 F (36.8 C) 97.9 F (36.6 C)  TempSrc: Oral   Oral  SpO2: 100% 95% 99% 100%  Weight:      Height:        Intake/Output Summary (Last 24 hours) at 08/31/2020 1903 Last data filed at 08/30/2020 2247 Gross per 24 hour  Intake 240 ml  Output --  Net 240 ml   Filed Weights   08/29/20 2314 08/30/20 0135  Weight: 74.3 kg 71.1 kg    Examination:  General: No acute distress. Cardiovascular: Heart sounds show Juliano Mceachin regular rate, and rhythm.  Lungs: Clear to auscultation bilaterally Abdomen: Soft, nontender, nondistended Neurological: Alert and oriented 3. Moves all extremities 4 . Cranial nerves II through XII grossly intact. Skin: Warm and dry. No rashes or lesions. Extremities: No clubbing or cyanosis. No edema.   Data Reviewed: I have personally reviewed following labs and  imaging studies  CBC: Recent Labs  Lab 08/29/20 2118 08/29/20 2119 08/30/20 0500 08/31/20 0420  WBC 14.4*  --  13.6* 5.4  NEUTROABS  --   --   --  2.9  HGB 14.9 16.0 13.2 10.3*  HCT 44.4 47.0 38.4* 30.1*  MCV 87.9  --  88.9 88.8  PLT 317  --  255 167    Basic Metabolic Panel: Recent Labs  Lab 08/30/20 0500 08/30/20 0812 08/30/20 1232 08/30/20 1620 08/31/20 0420  NA 135 135 135 134* 135  K 4.2 5.1 3.7 3.5 3.7  CL 100 103 102 98 103  CO2 22 24 22 24 26   GLUCOSE 162* 146* 78 162* 252*  BUN 24* 24* 20 19 14   CREATININE 1.03 1.05 0.88 1.14 0.80  CALCIUM 9.2 9.1 8.6* 9.0 8.5*  MG  --   --   --   --  1.9  PHOS  --   --   --   --  3.0    GFR: Estimated Creatinine Clearance: 117.3 mL/min (by C-G formula based on SCr of 0.8 mg/dL).  Liver Function Tests: Recent Labs  Lab 08/29/20 2118 08/31/20 0420  AST 32 20  ALT 43 24  ALKPHOS 105 63  BILITOT 1.4* 0.8  PROT 7.7 5.4*  ALBUMIN 5.0 3.4*    CBG: Recent Labs  Lab 08/30/20 1637 08/30/20 2106 08/31/20 0730 08/31/20 1120 08/31/20 1630  GLUCAP 138* 238* 217* 200* 118*     Recent Results (from the past 240 hour(s))  Respiratory Panel by RT PCR (Flu Anaise Sterbenz&B, Covid) - Nasopharyngeal Swab     Status: None   Collection Time: 08/29/20  9:39 PM   Specimen: Nasopharyngeal Swab  Result Value Ref Range Status   SARS Coronavirus 2 by RT PCR NEGATIVE NEGATIVE Final    Comment: (NOTE) SARS-CoV-2 target nucleic acids are NOT DETECTED.  The SARS-CoV-2 RNA is generally detectable in upper respiratoy specimens during the acute phase of infection. The lowest concentration of SARS-CoV-2 viral copies this assay can detect is 131 copies/mL. Djeneba Barsch negative result does not preclude SARS-Cov-2 infection and should not be used as the sole basis for treatment or other patient management decisions. Kiandria Clum negative result may occur with  improper specimen collection/handling, submission of specimen other than nasopharyngeal swab, presence of  viral mutation(s) within the areas targeted by this assay, and inadequate number of viral copies (<131 copies/mL). Lashon Beringer negative result must be combined with clinical observations, patient history, and epidemiological information. The expected result is Negative.  Fact Sheet for Patients:  09/02/20  Fact Sheet for Healthcare Providers:  08/31/20  This test is no t yet approved or cleared by the https://www.moore.com/ and  has been authorized for detection and/or diagnosis of SARS-CoV-2 by FDA under an Emergency Use Authorization (EUA). This EUA will remain  in effect (meaning this test can be used) for the duration of the COVID-19 declaration under Section 564(b)(1) of the Act, 21 U.S.C. section 360bbb-3(b)(1), unless the authorization is terminated or revoked sooner.     Influenza Babs Dabbs by PCR NEGATIVE NEGATIVE Final   Influenza B by PCR NEGATIVE NEGATIVE Final    Comment: (NOTE) The Xpert Xpress SARS-CoV-2/FLU/RSV assay is intended as an aid in  the diagnosis of influenza from Nasopharyngeal swab specimens and  should not be used as Abdi Husak sole basis for treatment. Nasal washings and  aspirates are unacceptable for Xpert Xpress SARS-CoV-2/FLU/RSV  testing.  Fact Sheet for Patients: https://www.moore.com/  Fact Sheet for Healthcare Providers: https://www.young.biz/  This test is not yet approved or cleared by the [place] FDA and  has been authorized for detection and/or diagnosis of SARS-CoV-2 by  FDA under an Emergency Use Authorization (EUA). This EUA will remain  in effect (meaning this test can be used) for the duration of the  Covid-19 declaration under Section 564(b)(1) of the Act, 21  U.S.C. section 360bbb-3(b)(1), unless the authorization is  terminated or revoked. Performed at [facility], [address] [place], [place] [address]   MRSA PCR Screening      Status: None   Collection Time: 08/30/20  1:27 AM   Specimen: Nasal Mucosa; Nasopharyngeal  Result Value Ref Range Status   MRSA by PCR NEGATIVE NEGATIVE Final    Comment:        The GeneXpert MRSA Assay (FDA approved for NASAL specimens only), is one component of [facility] comprehensive MRSA colonization surveillance program. It is not intended to diagnose MRSA infection nor to guide or monitor treatment for MRSA infections. Performed at [facility], [address] [place], [place] [address]          Radiology Studies: CT HEAD WO CONTRAST  Result Date: 08/29/2020 CLINICAL DATA:  Altered mental status. EXAM: CT HEAD WITHOUT CONTRAST TECHNIQUE: Contiguous axial images were obtained from the base of the skull through the vertex without intravenous contrast. COMPARISON:  July 14, 2017 FINDINGS: Brain: No evidence of acute infarction, hemorrhage, hydrocephalus, extra-axial collection or mass lesion/mass effect. Vascular: No hyperdense vessel or unexpected calcification. Skull: Normal. Negative for fracture or focal lesion. Sinuses/Orbits: There is mild left ethmoid sinus mucosal thickening. Melburn Treiber 1.5 cm x 1.2 cm polyp versus mucous retention cyst is seen within the posterior aspect of the left maxillary sinus. This is present on the prior study. Other: None. IMPRESSION: 1. No acute intracranial abnormality. 2. Mild left ethmoid sinus disease. 3. 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 Electronically Signed   By: Aram Candela M.D.   On: 08/29/2020 21:32   DG Chest Port 1 View  Result Date: 08/29/2020 CLINICAL DATA:  Altered mental status EXAM: PORTABLE CHEST 1 VIEW COMPARISON:  05/04/2019 FINDINGS: The heart size and mediastinal contours are within normal limits. Both lungs are clear. The visualized skeletal structures are unremarkable. IMPRESSION: No active disease. Electronically Signed   By: Alcide Clever M.D.   On: 08/29/2020 21:32         Scheduled Meds: . (feeding supplement) PROSource Plus  30 mL Oral TID BM  . buprenorphine-naloxone  1 tablet Sublingual TID  . chlorhexidine  15 mL Mouth Rinse BID  . enoxaparin (LOVENOX) injection  40 mg Subcutaneous Q24H  .  famotidine  20 mg Oral BID  . feeding supplement ([employer] STANDARD 1.4)  325 mL Oral Daily  . insulin aspart  0-5 Units Subcutaneous QHS  . insulin aspart  0-9 Units Subcutaneous TID WC  . insulin aspart  4 Units Subcutaneous TID WC  . insulin glargine  20 Units Subcutaneous Q24H  . multivitamin with minerals  1 tablet Oral Daily  . polyethylene glycol  17 g Oral Daily  . traZODone  100 mg Oral QHS   Continuous Infusions:    LOS: 1 day    Time spent: over 30 min    Lacretia Nicksaldwell Powell, MD [facility]   To contact the attending provider between 7A-7P or the covering provider during after hours 7P-7A, please log into the web site [email or web address] and access using universal [place] password for that web site. If you do not have the password, please call the hospital operator.  08/31/2020, 7:03 PM

## 2020-09-01 ENCOUNTER — Inpatient Hospital Stay (HOSPITAL_COMMUNITY): Payer: Medicare Other

## 2020-09-01 DIAGNOSIS — R001 Bradycardia, unspecified: Secondary | ICD-10-CM

## 2020-09-01 DIAGNOSIS — E1010 Type 1 diabetes mellitus with ketoacidosis without coma: Secondary | ICD-10-CM | POA: Diagnosis not present

## 2020-09-01 DIAGNOSIS — I441 Atrioventricular block, second degree: Secondary | ICD-10-CM

## 2020-09-01 LAB — CBC WITH DIFFERENTIAL/PLATELET
Abs Immature Granulocytes: 0.01 10*3/uL (ref 0.00–0.07)
Basophils Absolute: 0 10*3/uL (ref 0.0–0.1)
Basophils Relative: 1 %
Eosinophils Absolute: 0.2 10*3/uL (ref 0.0–0.5)
Eosinophils Relative: 4 %
HCT: 33.3 % — ABNORMAL LOW (ref 39.0–52.0)
Hemoglobin: 11.3 g/dL — ABNORMAL LOW (ref 13.0–17.0)
Immature Granulocytes: 0 %
Lymphocytes Relative: 41 %
Lymphs Abs: 1.9 10*3/uL (ref 0.7–4.0)
MCH: 30.1 pg (ref 26.0–34.0)
MCHC: 33.9 g/dL (ref 30.0–36.0)
MCV: 88.8 fL (ref 80.0–100.0)
Monocytes Absolute: 0.5 10*3/uL (ref 0.1–1.0)
Monocytes Relative: 11 %
Neutro Abs: 1.9 10*3/uL (ref 1.7–7.7)
Neutrophils Relative %: 43 %
Platelets: 173 10*3/uL (ref 150–400)
RBC: 3.75 MIL/uL — ABNORMAL LOW (ref 4.22–5.81)
RDW: 12.6 % (ref 11.5–15.5)
WBC: 4.5 10*3/uL (ref 4.0–10.5)
nRBC: 0 % (ref 0.0–0.2)

## 2020-09-01 LAB — PHOSPHORUS: Phosphorus: 3.9 mg/dL (ref 2.5–4.6)

## 2020-09-01 LAB — GLUCOSE, CAPILLARY
Glucose-Capillary: 66 mg/dL — ABNORMAL LOW (ref 70–99)
Glucose-Capillary: 453 mg/dL — ABNORMAL HIGH (ref 70–99)
Glucose-Capillary: 472 mg/dL — ABNORMAL HIGH (ref 70–99)
Glucose-Capillary: 258 mg/dL — ABNORMAL HIGH (ref 70–99)
Glucose-Capillary: 108 mg/dL — ABNORMAL HIGH (ref 70–99)

## 2020-09-01 LAB — COMPREHENSIVE METABOLIC PANEL
ALT: 25 U/L (ref 0–44)
AST: 23 U/L (ref 15–41)
Albumin: 3.6 g/dL (ref 3.5–5.0)
Alkaline Phosphatase: 69 U/L (ref 38–126)
Anion gap: 9 (ref 5–15)
BUN: 8 mg/dL (ref 6–20)
CO2: 30 mmol/L (ref 22–32)
Calcium: 9.1 mg/dL (ref 8.9–10.3)
Chloride: 103 mmol/L (ref 98–111)
Creatinine, Ser: 0.75 mg/dL (ref 0.61–1.24)
GFR, Estimated: >60 mL/min (ref >60)
Glucose, Bld: 66 mg/dL — ABNORMAL LOW (ref 70–99)
Potassium: 3 mmol/L — ABNORMAL LOW (ref 3.5–5.1)
Sodium: 142 mmol/L (ref 135–145)
Total Bilirubin: 0.6 mg/dL (ref 0.3–1.2)
Total Protein: 5.6 g/dL — ABNORMAL LOW (ref 6.5–8.1)

## 2020-09-01 LAB — ECHOCARDIOGRAM COMPLETE
Area-P 1/2: 3.48 cm2
Height: 73 in
S' Lateral: 2.9 cm
Weight: 2507.95 oz

## 2020-09-01 LAB — MAGNESIUM: Magnesium: 1.9 mg/dL (ref 1.7–2.4)

## 2020-09-01 LAB — BASIC METABOLIC PANEL
Anion gap: 9 (ref 5–15)
BUN: 13 mg/dL (ref 6–20)
CO2: 30 mmol/L (ref 22–32)
Calcium: 9.1 mg/dL (ref 8.9–10.3)
Chloride: 94 mmol/L — ABNORMAL LOW (ref 98–111)
Creatinine, Ser: 0.86 mg/dL (ref 0.61–1.24)
GFR, Estimated: >60 mL/min (ref >60)
Glucose, Bld: 482 mg/dL — ABNORMAL HIGH (ref 70–99)
Potassium: 5 mmol/L (ref 3.5–5.1)
Sodium: 133 mmol/L — ABNORMAL LOW (ref 135–145)

## 2020-09-01 LAB — TSH: TSH: 5.726 u[IU]/mL — ABNORMAL HIGH (ref 0.350–4.500)

## 2020-09-01 MED ORDER — MELATONIN 5 MG PO TABS
5.0000 mg | ORAL_TABLET | Freq: Once | ORAL | Status: AC
  Administered 2020-09-01: 5 mg via ORAL
  Filled 2020-09-01: qty 1

## 2020-09-01 MED ORDER — INSULIN ASPART 100 UNIT/ML ~~LOC~~ SOLN
0.0000 [IU] | Freq: Three times a day (TID) | SUBCUTANEOUS | Status: DC
  Administered 2020-09-01: 15 [IU] via SUBCUTANEOUS
  Administered 2020-09-01: 8 [IU] via SUBCUTANEOUS
  Administered 2020-09-02: 11 [IU] via SUBCUTANEOUS
  Administered 2020-09-02: 5 [IU] via SUBCUTANEOUS
  Administered 2020-09-02: 3 [IU] via SUBCUTANEOUS
  Administered 2020-09-03 (×2): 11 [IU] via SUBCUTANEOUS

## 2020-09-01 MED ORDER — INSULIN ASPART 100 UNIT/ML ~~LOC~~ SOLN: 0.0000 [IU] | Freq: Every day | SUBCUTANEOUS | Status: DC

## 2020-09-01 MED ORDER — INSULIN ASPART 100 UNIT/ML ~~LOC~~ SOLN: 0.0000 [IU] | Freq: Three times a day (TID) | SUBCUTANEOUS | Status: DC

## 2020-09-01 MED ORDER — POTASSIUM CHLORIDE CRYS ER 20 MEQ PO TBCR
40.0000 meq | EXTENDED_RELEASE_TABLET | ORAL | Status: AC
  Administered 2020-09-01 (×2): 40 meq via ORAL
  Filled 2020-09-01 (×2): qty 2

## 2020-09-01 MED ORDER — INSULIN GLARGINE 100 UNIT/ML ~~LOC~~ SOLN
15.0000 [IU] | SUBCUTANEOUS | Status: DC
  Administered 2020-09-01: 15 [IU] via SUBCUTANEOUS
  Filled 2020-09-01 (×2): qty 0.15

## 2020-09-01 NOTE — Consult Note (Signed)
Cardiology Consultation:   Patient ID: [name]. MRN: [id number]; DOB: 1976/07/02  Admit date: 08/29/2020 Date of Consult: 09/01/2020  Primary Care Provider: [name], NP [facility] Cardiologist: No primary care provider on file.  [facility] Electrophysiologist:  None    Patient Profile:   [name]. is a 44 y.o. male with a hx of type 1 diabetes, chronic pain who is being seen today for the evaluation of AV block at the request of Dr. Florene Glen.  History of Present Illness:   Mr. [name] presented to the hospital with nausea and vomiting after several days of difficult to control sugars.  He has no cardiovascular disease history and no history of syncope. He occasionally feels either clammy or has hot flashes at home, but these are typically mild.  Yesterday afternoon he was in the bathroom and started to feel clammy and nauseated. It resolved after several minutes. He believes this was around the time that his telemetry noted 2nd degree AV block type II. I personally reviewed the telemetry. At 3:18 PM, he had sinus rhythm and then had 5 beats of 2nd degree type II AV block. Ventricular rate was 38 bpm, sinus rate was 76 bpm. No further events.  Denies chest pain, shortness of breath at rest or with normal exertion. No PND, orthopnea, LE edema or unexpected weight gain. No syncope or palpitations.   Past Medical History:  Diagnosis Date  . Allergy   . Anxiety   . Arthritis    cervical- disc disease   . Bipolar depression ([place])   . Chronic neck pain   . Chronic pain disorder    Sees [facility]  . Depression   . Diabetes mellitus    Type II  . Drug-seeking behavior   . GERD (gastroesophageal reflux disease)   . Hyperlipidemia   . Hypertension   . Hypogonadism   . Opiate dependence, continuous ([place])   . Pneumonia    11/2014- tx at [facility]  . Polysubstance abuse [facility])   . Urinary incontinence    detrusor instability    Past  Surgical History:  Procedure Laterality Date  . ANTERIOR CERVICAL DECOMP/DISCECTOMY FUSION N/A 12/21/2015   Procedure: Cervical four-five, Cervical five-six Anterior Cervical Discectomy Fusion;  Surgeon: Eustace Moore, MD;  Location: [facility];  Service: Neurosurgery;  Laterality: N/A;  . APPENDECTOMY  2002  . CARDIAC CATHETERIZATION  2006   done due to pain in his chest, no blockage seen  . HERNIA REPAIR  2002   abdominal  . NASAL SINUS SURGERY  2008     Home Medications:  Prior to Admission medications   Medication Sig Start Date End Date Taking? Authorizing Provider  buprenorphine-naloxone (SUBOXONE) 8-2 mg SUBL SL tablet Place 1 tablet under the tongue in the morning, at noon, and at bedtime.    Yes [provider]  insulin degludec (TRESIBA FLEXTOUCH) 200 UNIT/ML FlexTouch Pen Inject 25 Units into the skin at bedtime.   Yes [provider]  insulin lispro (HUMALOG KWIKPEN) 100 UNIT/ML KwikPen Inject 10-20 Units into the skin 3 (three) times daily.   Yes [provider]  traZODone (DESYREL) 100 MG tablet Take 100 mg by mouth at bedtime. 07/03/20  Yes [provider]  blood glucose meter kit and supplies KIT Dispense based on patient and insurance preference. Use up to four times daily as directed. (FOR ICD-9 250.00, 250.01). 05/05/19   Florencia Reasons, MD  insulin glargine (LANTUS) 100 UNIT/ML  injection Inject 0.15 mLs (15 Units total) into the skin 2 (two) times daily. For diabetes management Patient not taking: Reported on 08/30/2020 05/05/19   Florencia Reasons, MD  albuterol (PROVENTIL) 90 MCG/ACT inhaler Inhale 2 puffs into the lungs every 6 (six) hours as needed for wheezing. 07/22/11 02/04/12  Debbrah Alar, NP    Inpatient Medications: Scheduled Meds: . (feeding supplement) PROSource Plus  30 mL Oral TID BM  . buprenorphine-naloxone  1 tablet Sublingual TID  . chlorhexidine  15 mL Mouth Rinse BID  . enoxaparin (LOVENOX) injection  40 mg Subcutaneous  Q24H  . famotidine  20 mg Oral BID  . feeding supplement (KATE FARMS STANDARD 1.4)  325 mL Oral Daily  . insulin aspart  0-15 Units Subcutaneous TID WC  . insulin aspart  0-5 Units Subcutaneous QHS  . insulin aspart  4 Units Subcutaneous TID WC  . insulin glargine  15 Units Subcutaneous Q24H  . multivitamin with minerals  1 tablet Oral Daily  . polyethylene glycol  17 g Oral Daily  . potassium chloride  40 mEq Oral Q4H  . traZODone  100 mg Oral QHS   Continuous Infusions:  PRN Meds: acetaminophen, dextrose, ondansetron (ZOFRAN) IV  Allergies:    Allergies  Allergen Reactions  . Tramadol Other (See Comments)    SERATONIN SYNDROME DIZZINESS SERATONIN SYNDROME DIZZINESS SERATONIN SYNDROME DIZZINESS Causes seratonin syndrome  . Ciprofloxacin Hives    Social History:   Social History   Socioeconomic History  . Marital status: Divorced    Spouse name: Not on file  . Number of children: 2  . Years of education: Not on file  . Highest education level: Not on file  Occupational History  . Occupation: Disabled    Fish farm manager: UNEMPLOYED    Comment: chronic pain  Tobacco Use  . Smoking status: Current Every Day Smoker    Packs/day: 1.50    Years: 25.00    Pack years: 37.50    Types: Cigarettes  . Smokeless tobacco: Never Used  Substance and Sexual Activity  . Alcohol use: No    Alcohol/week: 0.0 standard drinks  . Drug use: No  . Sexual activity: Not Currently    Partners: Female  Other Topics Concern  . Not on file  Social History Narrative  . Not on file   Social Determinants of Health   Financial Resource Strain:   . Difficulty of Paying Living Expenses: Not on file  Food Insecurity:   . Worried About Charity fundraiser in the Last Year: Not on file  . Ran Out of Food in the Last Year: Not on file  Transportation Needs:   . Lack of Transportation (Medical): Not on file  . Lack of Transportation (Non-Medical): Not on file  Physical Activity:   . Days of  Exercise per Week: Not on file  . Minutes of Exercise per Session: Not on file  Stress:   . Feeling of Stress : Not on file  Social Connections:   . Frequency of Communication with Friends and Family: Not on file  . Frequency of Social Gatherings with Friends and Family: Not on file  . Attends Religious Services: Not on file  . Active Member of Clubs or Organizations: Not on file  . Attends Archivist Meetings: Not on file  . Marital Status: Not on file  Intimate Partner Violence:   . Fear of Current or Ex-Partner: Not on file  . Emotionally Abused: Not on file  . Physically  Abused: Not on file  . Sexually Abused: Not on file    Family History:    Family History  Problem Relation Age of Onset  . Diabetes Mother   . Hypertension Mother   . Cirrhosis Mother   . Depression Mother   . Bipolar disorder Mother   . Arthritis Father 50       osteoarthritis  . Bipolar disorder Father   . Heart disease Paternal Uncle 76       MI  . Heart disease Maternal Grandfather        late 60's--MI  . Cancer Paternal Grandmother 60       lung  . Bipolar disorder Paternal Grandmother   . Heart disease Paternal Grandfather 19       MI  . Bipolar disorder Paternal Grandfather   . Diabetes Sister        borderline     ROS:  Please see the history of present illness.  Constitutional: Negative for chills, fever, night sweats, unintentional weight loss  HENT: Negative for ear pain and hearing loss.   Eyes: Negative for loss of vision and eye pain.  Respiratory: Negative for cough, sputum, wheezing.   Cardiovascular: See HPI. Gastrointestinal: Has had nausea and abdominal pain Genitourinary: Negative for dysuria and hematuria.  Musculoskeletal: Negative for falls and myalgias.  Skin: Negative for itching and rash.  Neurological: Negative for focal weakness, focal sensory changes and loss of consciousness.  Endo/Heme/Allergies: Does not bruise/bleed easily.  All other ROS reviewed  and negative.     Physical Exam/Data:   Vitals:   08/31/20 0805 08/31/20 1348 08/31/20 2056 09/01/20 0444  BP: (!) 98/58 130/84 131/86 126/78  Pulse: 66 (!) 59 62 (!) 51  Resp: _0 Temp: 98.3 F (36.8 C) 97.9 F (36.6 C) 98.2 F (36.8 C) 97.7 F (36.5 C)  TempSrc:  Oral Oral Oral  SpO2: 99% 100% 100% 98%  Weight:      Height:        Intake/Output Summary (Last 24 hours) at 09/01/2020 1257 Last data filed at 09/01/2020 0900 Gross per 24 hour  Intake 480 ml  Output --  Net 480 ml   Last 3 Weights 08/30/2020 08/29/2020 05/04/2019  Weight (lbs) 156 lb 12 oz 163 lb 12.8 oz 165 lb  Weight (kg) 71.1 kg 74.3 kg 74.844 kg  Some encounter information is confidential and restricted. Go to Review Flowsheets activity to see all data.     Body mass index is 20.68 kg/m.  General:  Well nourished, well developed, in no acute distress HEENT: normal Lymph: no adenopathy Neck: no JVD Endocrine:  No thryomegaly Vascular: No carotid bruits; RA pulses 2+ bilaterally Cardiac:  normal S1, S2; RRR; no murmur Lungs:  clear to auscultation bilaterally, no wheezing, rhonchi or rales  Abd: soft, nontender, no hepatomegaly  Ext: no edema Musculoskeletal:  No deformities, BUE and BLE strength normal and equal Skin: warm and dry  Neuro:  CNs 2-12 intact, no focal abnormalities noted Psych:  Normal affect   EKG:  The EKG was personally reviewed and demonstrates:  Sinus tachycardia at 105 bpm Telemetry:  Telemetry was personally reviewed and demonstrates:  Sinus rhythm/sinus tachycardia with only the episode of high degree AV block noted above.  Relevant CV Studies: Echo 09/01/20 1. Left ventricular ejection fraction, by estimation, is 60 to 65%. The  left ventricle has normal function. The left ventricle has no regional  wall motion abnormalities. Left ventricular diastolic  parameters were  normal.  2. Right ventricular systolic function is normal. The right ventricular  size is  normal. Tricuspid regurgitation signal is inadequate for assessing  PA pressure.  3. The mitral valve is normal in structure. Trivial mitral valve  regurgitation. No evidence of mitral stenosis.  4. The aortic valve is grossly normal. Aortic valve regurgitation is not  visualized. No aortic stenosis is present.  5. The inferior vena cava is dilated in size with <50% respiratory  variability, suggesting right atrial pressure of 15 mmHg.   Laboratory Data:  High Sensitivity Troponin:   Recent Labs  Lab 08/29/20 2118 08/30/20 0014  TROPONINIHS 7 9     Chemistry Recent Labs  Lab 08/30/20 1620 08/31/20 0420 09/01/20 0402  NA 134* 135 142  K 3.5 3.7 3.0*  CL 98 103 103  CO2 _0 GLUCOSE 162* 252* 66*  BUN _1 CREATININE 1.14 0.80 0.75  CALCIUM 9.0 8.5* 9.1  GFRNONAA >60 >60 >60  ANIONGAP _2 Recent Labs  Lab 08/29/20 2118 08/31/20 0420 09/01/20 0402  PROT 7.7 5.4* 5.6*  ALBUMIN 5.0 3.4* 3.6  AST 32 20 23  ALT 43 24 25  ALKPHOS 105 63 69  BILITOT 1.4* 0.8 0.6   Hematology Recent Labs  Lab 08/30/20 0500 08/31/20 0420 09/01/20 0402  WBC 13.6* 5.4 4.5  RBC 4.32 3.39* 3.75*  HGB 13.2 10.3* 11.3*  HCT 38.4* 30.1* 33.3*  MCV 88.9 88.8 88.8  MCH 30.6 30.4 30.1  MCHC 34.4 34.2 33.9  RDW 12.4 12.7 12.6  PLT 255 167 173   BNPNo results for input(s): BNP, PROBNP in the last 168 hours.  DDimer No results for input(s): DDIMER in the last 168 hours.   Radiology/Studies:  CT HEAD WO CONTRAST  Result Date: 08/29/2020 CLINICAL DATA:  Altered mental status. EXAM: CT HEAD WITHOUT CONTRAST TECHNIQUE: Contiguous axial images were obtained from the base of the skull through the vertex without intravenous contrast. COMPARISON:  July 14, 2017 FINDINGS: Brain: No evidence of acute infarction, hemorrhage, hydrocephalus, extra-axial collection or mass lesion/mass effect. Vascular: No hyperdense vessel or unexpected calcification. Skull: Normal. Negative for  fracture or focal lesion. Sinuses/Orbits: There is mild left ethmoid sinus mucosal thickening. A 1.5 cm x 1.2 cm polyp versus mucous retention cyst is seen within the posterior aspect of the left maxillary sinus. This is present on the prior study. Other: None. IMPRESSION: 1. No acute intracranial abnormality. 2. Mild left ethmoid sinus disease. 3. 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 Electronically Signed   By: Virgina Norfolk M.D.   On: 08/29/2020 21:32   DG Chest Port 1 View  Result Date: 08/29/2020 CLINICAL DATA:  Altered mental status EXAM: PORTABLE CHEST 1 VIEW COMPARISON:  05/04/2019 FINDINGS: The heart size and mediastinal contours are within normal limits. Both lungs are clear. The visualized skeletal structures are unremarkable. IMPRESSION: No active disease. Electronically Signed   By: Inez Catalina M.D.   On: 08/29/2020 21:32   ECHOCARDIOGRAM COMPLETE  Result Date: 09/01/2020    ECHOCARDIOGRAM REPORT   Patient Name:   [name]. Date of Exam: 09/01/2020 Medical Rec #:  [id number]            Height:       73.0 in Accession #:    [id number]           Weight:       156.7 lb Date of  Birth:  12-17-1975           BSA:          1.940 m Patient Age:    43 years             BP:           126/78 mmHg Patient Gender: M                    HR:           62 bpm. Exam Location:  Inpatient Procedure: 2D Echo, Color Doppler and Cardiac Doppler Indications:    R00.1 Bradycardia, unspecified  History:        Patient has no prior history of Echocardiogram examinations.                 COPD; Risk Factors:Hypertension, Diabetes, Dyslipidemia and                 Polysubstance abuse.  Sonographer:    Raquel Sarna Senior RDCS Referring Phys: [phone] A CALDWELL Hunter  1. Left ventricular ejection fraction, by estimation, is 60 to 65%. The left ventricle has normal function. The left ventricle has no regional wall motion abnormalities. Left ventricular diastolic parameters  were normal.  2. Right ventricular systolic function is normal. The right ventricular size is normal. Tricuspid regurgitation signal is inadequate for assessing PA pressure.  3. The mitral valve is normal in structure. Trivial mitral valve regurgitation. No evidence of mitral stenosis.  4. The aortic valve is grossly normal. Aortic valve regurgitation is not visualized. No aortic stenosis is present.  5. The inferior vena cava is dilated in size with <50% respiratory variability, suggesting right atrial pressure of 15 mmHg. Comparison(s): No prior Echocardiogram. Conclusion(s)/Recommendation(s): Normal biventricular function without evidence of hemodynamically significant valvular heart disease. FINDINGS  Left Ventricle: Left ventricular ejection fraction, by estimation, is 60 to 65%. The left ventricle has normal function. The left ventricle has no regional wall motion abnormalities. The left ventricular internal cavity size was normal in size. There is  no left ventricular hypertrophy. Left ventricular diastolic parameters were normal. Right Ventricle: The right ventricular size is normal. No increase in right ventricular wall thickness. Right ventricular systolic function is normal. Tricuspid regurgitation signal is inadequate for assessing PA pressure. Left Atrium: Left atrial size was normal in size. Right Atrium: Right atrial size was normal in size. Pericardium: There is no evidence of pericardial effusion. Mitral Valve: The mitral valve is normal in structure. Trivial mitral valve regurgitation. No evidence of mitral valve stenosis. Tricuspid Valve: The tricuspid valve is normal in structure. Tricuspid valve regurgitation is trivial. No evidence of tricuspid stenosis. Aortic Valve: The aortic valve is grossly normal. Aortic valve regurgitation is not visualized. No aortic stenosis is present. Pulmonic Valve: The pulmonic valve was not well visualized. Pulmonic valve regurgitation is not visualized. Aorta:  The ascending aorta was not well visualized and the aortic root is normal in size and structure. Venous: The inferior vena cava is dilated in size with less than 50% respiratory variability, suggesting right atrial pressure of 15 mmHg. IAS/Shunts: The atrial septum is grossly normal.  LEFT VENTRICLE PLAX 2D LVIDd:         4.80 cm  Diastology LVIDs:         2.90 cm  LV e' medial:    10.70 cm/s LV PW:         1.00 cm  LV E/e' medial:  7.8 LV  IVS:        0.90 cm  LV e' lateral:   14.50 cm/s LVOT diam:     2.30 cm  LV E/e' lateral: 5.7 LV SV:         99 LV SV Index:   51 LVOT Area:     4.15 cm  RIGHT VENTRICLE RV S prime:     13.70 cm/s TAPSE (M-mode): 2.6 cm LEFT ATRIUM             Index       RIGHT ATRIUM           Index LA diam:        3.60 cm 1.86 cm/m  RA Area:     11.60 cm LA Vol (A2C):   62.9 ml 32.42 ml/m RA Volume:   24.50 ml  12.63 ml/m LA Vol (A4C):   41.8 ml 21.55 ml/m LA Biplane Vol: 56.1 ml 28.92 ml/m  AORTIC VALVE LVOT Vmax:   106.00 cm/s LVOT Vmean:  71.500 cm/s LVOT VTI:    0.239 m  AORTA Ao Root diam: 3.80 cm MITRAL VALVE MV Area (PHT): 3.48 cm    SHUNTS MV Decel Time: 218 msec    Systemic VTI:  0.24 m MV E velocity: 83.10 cm/s  Systemic Diam: 2.30 cm MV A velocity: 69.00 cm/s MV E/A ratio:  1.20 Buford Dresser MD Electronically signed by Buford Dresser MD Signature Date/Time: 09/01/2020/10:13:59 AM    Final      Assessment and Plan:   Brief episode of second degree type II AV block -occurred while feeling ill in the bathroom -has not had syncope -baseline ECG sinus without conduction disease -normal echo -high suspicion that the temporary block was due to high vagal tone while using the bathroom, in the setting of his nausea and pain -we discussed red flag warning signs, especially syncope, that need immediate attention -no indication for pacemaker  Type I diabetes Nausea, abdominal pain -management per primary team  No indication for further cardiology  evaluation at this time. We will sign off.  For questions or updates, please contact [place] Please consult [email or web address] for contact info under    Signed, Buford Dresser, MD  09/01/2020 12:57 PM

## 2020-09-01 NOTE — Plan of Care (Signed)
Patient is alert and oriented. Patient has been calm, cooperative, and very responsive today. He continues to experience high Glucose readings. Patient ate all of his meals and tries to be careful of what he consumes.  Report given to nurse Areta Haber RN.  Problem: Metabolic: Goal: Ability to maintain appropriate glucose levels will improve Outcome: Not Progressing   Problem: Nutritional: Goal: Maintenance of adequate nutrition will improve Outcome: Not Progressing

## 2020-09-01 NOTE — Progress Notes (Signed)
Echocardiogram 2D Echocardiogram has been performed.  [name] 09/01/2020, 9:04 AM

## 2020-09-01 NOTE — Progress Notes (Signed)
PROGRESS NOTE    [name].  DDU:[id number] DOB: December 09, 1975 DOA: 08/29/2020 PCP: Timothy Riggs, NP   Chief Complaint  Patient presents with  . Chest Pain  . Emesis    Brief Narrative [name]. is [name] 44 y.o. male with history of diabetes mellitus type 1, chronic pain history of polysubstance abuse presents to the ER admits in [facility] with complaint of nausea vomiting over the last 2 to 3 days.  Patient states he usually is on [name] insulin pump but last week was not able to get [name] refill on his cartridges and he had to go back on [place] and his blood sugars were fluctuating and was not able to keep in good control.  Patient started developing nausea vomiting and diarrhea over the last 3 days and was unable to keep anything.  Denies any chest pain or shortness of breath.  ED Course: In the ER patient was tachycardic with labs showing bicarb of 12 creatinine 1.6 anion gap of 29 blood glucose of 476 urine drug screen was negative chest x-ray unremarkable EKG showing sinus tachycardia patient is admitted for diabetic ketoacidosis for which patient was started on insulin infusion fluids.  In the ER patient was found to be mildly confused and [name] CT head was done which was unremarkable.  By the time I examined the patient patient is alert awake and oriented to time place and person.  Assessment & Plan:   Principal Problem:   DKA (diabetic ketoacidosis) (HCC) Active Problems:   AKI (acute kidney injury) (HCC)  1. Secondary Degree AV block Type 2: noted on telemetry around 1518 on 10/22.  He was using bathroom around that time and noted lightheadedness.  Cardiology has been consulted. 1. Follow echo 2. Follow TSH 3. Telemetry   2. Diabetic ketoacidosis  Hypoglycemia 1. Notes insurance stopped paying for pods for his pump and he's had issues transitioning to basal/bolus regimen 2. Gap closed, BG's improved, continue basal bolus regimen and adjust as needed - hypoglycemia this  AM, adjust lantus 3. Follow A1c 11.2 4. Diabetic coordinator    3. Nausea  vomiting unclear etiology, continue to follow.  W/u additionally as needed.  Improved at this time.  4. Acute renal failure likely from volume depletion - improved after IVF  5. History of chronic pain  Hx Polysubstance Abuse on buprenorphine.  6. Acute metabolic encephalopathy likely 2/2 DKA.  Urine drug screen negative.  CT head unremarkable.  DVT prophylaxis: lovenox Code Status: full  Family Communication: none at bedside Disposition:   Status is: Inpatient  Remains inpatient appropriate because:Inpatient level of care appropriate due to severity of illness   Dispo: The patient is from: Home              Anticipated d/c is to: Home              Anticipated d/c date is: 1 day              Patient currently is not medically stable to d/c.  Consultants:   none  Procedures:  none  Antimicrobials:  Anti-infectives (From admission, onward)   None        Subjective: Feeling [name] little better No new complaints  Objective: Vitals:   08/31/20 0805 08/31/20 1348 08/31/20 2056 09/01/20 0444  BP: (!) 98/58 130/84 131/86 126/78  Pulse: 66 (!) 59 62 (!) 51  Resp: 18 18 16 16   Temp: 98.3 F (36.8 C) 97.9 F (36.6  C) 98.2 F (36.8 C) 97.7 F (36.5 C)  TempSrc:  Oral Oral Oral  SpO2: 99% 100% 100% 98%  Weight:      Height:       No intake or output data in the 24 hours ending 09/01/20 0923 Filed Weights   08/29/20 2314 08/30/20 0135  Weight: 74.3 kg 71.1 kg    Examination:  General: No acute distress. Cardiovascular: Heart sounds show [name] regular rate, and rhythm.  Lungs: Clear to auscultation bilaterally  Abdomen: Soft, nontender, nondistended  Neurological: Alert and oriented 3. Moves all extremities 4 . Cranial nerves II through XII grossly intact. Skin: Warm and dry. No rashes or lesions. Extremities: No clubbing or cyanosis. No edema.   Data Reviewed: I have personally  reviewed following labs and imaging studies  CBC: Recent Labs  Lab 08/29/20 2118 08/29/20 2119 08/30/20 0500 08/31/20 0420 09/01/20 0402  WBC 14.4*  --  13.6* 5.4 4.5  NEUTROABS  --   --   --  2.9 1.9  HGB 14.9 16.0 13.2 10.3* 11.3*  HCT 44.4 47.0 38.4* 30.1* 33.3*  MCV 87.9  --  88.9 88.8 88.8  PLT 317  --  255 167 173    Basic Metabolic Panel: Recent Labs  Lab 08/30/20 0812 08/30/20 1232 08/30/20 1620 08/31/20 0420 09/01/20 0402  NA 135 135 134* 135 142  K 5.1 3.7 3.5 3.7 3.0*  CL 103 102 98 103 103  CO2 24 22 24 26 30   GLUCOSE 146* 78 162* 252* 66*  BUN 24* 20 19 14 8   CREATININE 1.05 0.88 1.14 0.80 0.75  CALCIUM 9.1 8.6* 9.0 8.5* 9.1  MG  --   --   --  1.9 1.9  PHOS  --   --   --  3.0 3.9    GFR: Estimated Creatinine Clearance: 117.3 mL/min (by C-G formula based on SCr of 0.75 mg/dL).  Liver Function Tests: Recent Labs  Lab 08/29/20 2118 08/31/20 0420 09/01/20 0402  AST 32 20 23  ALT 43 24 25  ALKPHOS 105 63 69  BILITOT 1.4* 0.8 0.6  PROT 7.7 5.4* 5.6*  ALBUMIN 5.0 3.4* 3.6    CBG: Recent Labs  Lab 08/31/20 0730 08/31/20 1120 08/31/20 1630 08/31/20 2057 09/01/20 0737  GLUCAP 217* 200* 118* 160* 66*     Recent Results (from the past 240 hour(s))  Respiratory Panel by RT PCR (Flu [name]&B, Covid) - Nasopharyngeal Swab     Status: None   Collection Time: 08/29/20  9:39 PM   Specimen: Nasopharyngeal Swab  Result Value Ref Range Status   SARS Coronavirus 2 by RT PCR NEGATIVE NEGATIVE Final    Comment: (NOTE) SARS-CoV-2 target nucleic acids are NOT DETECTED.  The SARS-CoV-2 RNA is generally detectable in upper respiratoy specimens during the acute phase of infection. The lowest concentration of SARS-CoV-2 viral copies this assay can detect is 131 copies/mL. [name] negative result does not preclude SARS-Cov-2 infection and should not be used as the sole basis for treatment or other patient management decisions. [name] negative result may occur with   improper specimen collection/handling, submission of specimen other than nasopharyngeal swab, presence of viral mutation(s) within the areas targeted by this assay, and inadequate number of viral copies (<131 copies/mL). [name] negative result must be combined with clinical observations, patient history, and epidemiological information. The expected result is Negative.  Fact Sheet for Patients:  09/03/20  Fact Sheet for Healthcare Providers:  08/31/20  This test is no t yet  approved or cleared by the [place] and  has been authorized for detection and/or diagnosis of SARS-CoV-2 by FDA under an Emergency Use Authorization (EUA). This EUA will remain  in effect (meaning this test can be used) for the duration of the COVID-19 declaration under Section 564(b)(1) of the Act, 21 U.S.C. section 360bbb-3(b)(1), unless the authorization is terminated or revoked sooner.     Influenza [name] by PCR NEGATIVE NEGATIVE Final   Influenza B by PCR NEGATIVE NEGATIVE Final    Comment: (NOTE) The Xpert Xpress SARS-CoV-2/FLU/RSV assay is intended as an aid in  the diagnosis of influenza from Nasopharyngeal swab specimens and  should not be used as [name] sole basis for treatment. Nasal washings and  aspirates are unacceptable for Xpert Xpress SARS-CoV-2/FLU/RSV  testing.  Fact Sheet for Patients: https://www.moore.com/  Fact Sheet for Healthcare Providers: https://www.young.biz/  This test is not yet approved or cleared by the [place] FDA and  has been authorized for detection and/or diagnosis of SARS-CoV-2 by  FDA under an Emergency Use Authorization (EUA). This EUA will remain  in effect (meaning this test can be used) for the duration of the  Covid-19 declaration under Section 564(b)(1) of the Act, 21  U.S.C. section 360bbb-3(b)(1), unless the authorization is  terminated or  revoked. Performed at [facility], [address] [place], [place] [address]   MRSA PCR Screening     Status: None   Collection Time: 08/30/20  1:27 AM   Specimen: Nasal Mucosa; Nasopharyngeal  Result Value Ref Range Status   MRSA by PCR NEGATIVE NEGATIVE Final    Comment:        The GeneXpert MRSA Assay (FDA approved for NASAL specimens only), is one component of [name] comprehensive MRSA colonization surveillance program. It is not intended to diagnose MRSA infection nor to guide or monitor treatment for MRSA infections. Performed at [facility], [address], [place], [place] [address]          Radiology Studies: No results found.      Scheduled Meds: . (feeding supplement) PROSource Plus  30 mL Oral TID BM  . buprenorphine-naloxone  1 tablet Sublingual TID  . chlorhexidine  15 mL Mouth Rinse BID  . enoxaparin (LOVENOX) injection  40 mg Subcutaneous Q24H  . famotidine  20 mg Oral BID  . feeding supplement (K[employer] [place]AR[place] STANDARD 1.4)  325 mL Oral Daily  . insulin aspart  0-5 Units Subcutaneous QHS  . insulin aspart  0-9 Units Subcutaneous TID WC  . insulin aspart  4 Units Subcutaneous TID WC  . insulin glargine  15 Units Subcutaneous Q24H  . multivitamin with minerals  1 tablet Oral Daily  . polyethylene glycol  17 g Oral Daily  . potassium chloride  40 mEq Oral Q4H  . traZODone  100 mg Oral QHS   Continuous Infusions:    LOS: 2 days    Time spent: over 30 min    Lacretia Nicks, MD [facility]   To contact the attending provider between 7A-7P or the covering provider during after hours 7P-7A, please log into the web site [email or web address] and access using universal [place] password for that web site. If you do not have the password, please call the hospital operator.  09/01/2020, 9:23 AM

## 2020-09-02 DIAGNOSIS — E1010 Type 1 diabetes mellitus with ketoacidosis without coma: Secondary | ICD-10-CM | POA: Diagnosis not present

## 2020-09-02 LAB — CBC WITH DIFFERENTIAL/PLATELET
Abs Immature Granulocytes: 0 10*3/uL (ref 0.00–0.07)
Basophils Absolute: 0 10*3/uL (ref 0.0–0.1)
Basophils Relative: 1 %
Eosinophils Absolute: 0.2 10*3/uL (ref 0.0–0.5)
Eosinophils Relative: 5 %
HCT: 35 % — ABNORMAL LOW (ref 39.0–52.0)
Hemoglobin: 11.4 g/dL — ABNORMAL LOW (ref 13.0–17.0)
Immature Granulocytes: 0 %
Lymphocytes Relative: 36 %
Lymphs Abs: 1.2 10*3/uL (ref 0.7–4.0)
MCH: 29.2 pg (ref 26.0–34.0)
MCHC: 32.6 g/dL (ref 30.0–36.0)
MCV: 89.7 fL (ref 80.0–100.0)
Monocytes Absolute: 0.4 10*3/uL (ref 0.1–1.0)
Monocytes Relative: 12 %
Neutro Abs: 1.6 10*3/uL — ABNORMAL LOW (ref 1.7–7.7)
Neutrophils Relative %: 46 %
Platelets: 179 10*3/uL (ref 150–400)
RBC: 3.9 MIL/uL — ABNORMAL LOW (ref 4.22–5.81)
RDW: 12.8 % (ref 11.5–15.5)
WBC: 3.5 10*3/uL — ABNORMAL LOW (ref 4.0–10.5)
nRBC: 0 % (ref 0.0–0.2)

## 2020-09-02 LAB — COMPREHENSIVE METABOLIC PANEL
ALT: 29 U/L (ref 0–44)
AST: 23 U/L (ref 15–41)
Albumin: 4.1 g/dL (ref 3.5–5.0)
Alkaline Phosphatase: 69 U/L (ref 38–126)
Anion gap: 8 (ref 5–15)
BUN: 14 mg/dL (ref 6–20)
CO2: 29 mmol/L (ref 22–32)
Calcium: 8.9 mg/dL (ref 8.9–10.3)
Chloride: 99 mmol/L (ref 98–111)
Creatinine, Ser: 0.71 mg/dL (ref 0.61–1.24)
GFR, Estimated: >60 mL/min (ref >60)
Glucose, Bld: 208 mg/dL — ABNORMAL HIGH (ref 70–99)
Potassium: 4.6 mmol/L (ref 3.5–5.1)
Sodium: 136 mmol/L (ref 135–145)
Total Bilirubin: 0.5 mg/dL (ref 0.3–1.2)
Total Protein: 6.3 g/dL — ABNORMAL LOW (ref 6.5–8.1)

## 2020-09-02 LAB — GLUCOSE, CAPILLARY
Glucose-Capillary: 321 mg/dL — ABNORMAL HIGH (ref 70–99)
Glucose-Capillary: 240 mg/dL — ABNORMAL HIGH (ref 70–99)
Glucose-Capillary: 154 mg/dL — ABNORMAL HIGH (ref 70–99)
Glucose-Capillary: 191 mg/dL — ABNORMAL HIGH (ref 70–99)

## 2020-09-02 LAB — PHOSPHORUS: Phosphorus: 4.4 mg/dL (ref 2.5–4.6)

## 2020-09-02 LAB — MAGNESIUM: Magnesium: 2.3 mg/dL (ref 1.7–2.4)

## 2020-09-02 LAB — T4, FREE: Free T4: 1.01 ng/dL (ref 0.61–1.12)

## 2020-09-02 MED ORDER — INSULIN GLARGINE 100 UNIT/ML ~~LOC~~ SOLN
20.0000 [IU] | SUBCUTANEOUS | Status: DC
  Administered 2020-09-02: 20 [IU] via SUBCUTANEOUS
  Filled 2020-09-02 (×2): qty 0.2

## 2020-09-02 MED ORDER — DIPHENHYDRAMINE HCL 25 MG PO CAPS
25.0000 mg | ORAL_CAPSULE | Freq: Once | ORAL | Status: AC
  Administered 2020-09-02: 25 mg via ORAL
  Filled 2020-09-02: qty 1

## 2020-09-02 NOTE — Plan of Care (Signed)
Alert and oriented. Denies any pain. Ambulatory. Blood glucose checked. Patient had a hard time going to sleep, one time dose of benadryl 25 mg PO given. Continent of bowel and bladder. On telemetry. Call bell within reach.  Problem: Education: Goal: Knowledge of General Education information will improve Description: Including pain rating scale, medication(s)/side effects and non-pharmacologic comfort measures 09/02/2020 0321 by Robyne Peers, RN Outcome: Progressing 09/02/2020 0316 by Robyne Peers, RN Outcome: Progressing   Problem: Health Behavior/Discharge Planning: Goal: Ability to manage health-related needs will improve 09/02/2020 0321 by Robyne Peers, RN Outcome: Progressing 09/02/2020 0316 by Robyne Peers, RN Outcome: Progressing   Problem: Clinical Measurements: Goal: Ability to maintain clinical measurements within normal limits will improve 09/02/2020 0321 by Robyne Peers, RN Outcome: Progressing 09/02/2020 0316 by Robyne Peers, RN Outcome: Progressing Goal: Will remain free from infection 09/02/2020 0321 by Robyne Peers, RN Outcome: Progressing 09/02/2020 0316 by Robyne Peers, RN Outcome: Progressing Goal: Diagnostic test results will improve 09/02/2020 0321 by Robyne Peers, RN Outcome: Progressing 09/02/2020 0316 by Robyne Peers, RN Outcome: Progressing Goal: Respiratory complications will improve 09/02/2020 0321 by Robyne Peers, RN Outcome: Progressing 09/02/2020 0316 by Robyne Peers, RN Outcome: Progressing Goal: Cardiovascular complication will be avoided 09/02/2020 0321 by Robyne Peers, RN Outcome: Progressing 09/02/2020 0316 by Robyne Peers, RN Outcome: Progressing   Problem: Activity: Goal: Risk for activity intolerance will decrease 09/02/2020 0321 by Robyne Peers, RN Outcome: Progressing 09/02/2020 0316 by Robyne Peers, RN Outcome: Progressing   Problem: Nutrition: Goal: Adequate nutrition will be  maintained 09/02/2020 0321 by Robyne Peers, RN Outcome: Progressing 09/02/2020 0316 by Robyne Peers, RN Outcome: Progressing   Problem: Coping: Goal: Level of anxiety will decrease 09/02/2020 0321 by Robyne Peers, RN Outcome: Progressing 09/02/2020 0316 by Robyne Peers, RN Outcome: Progressing   Problem: Elimination: Goal: Will not experience complications related to bowel motility 09/02/2020 0321 by Robyne Peers, RN Outcome: Progressing 09/02/2020 0316 by Robyne Peers, RN Outcome: Progressing Goal: Will not experience complications related to urinary retention 09/02/2020 0321 by Robyne Peers, RN Outcome: Progressing 09/02/2020 0316 by Robyne Peers, RN Outcome: Progressing   Problem: Pain Managment: Goal: General experience of comfort will improve 09/02/2020 0321 by Robyne Peers, RN Outcome: Progressing 09/02/2020 0316 by Robyne Peers, RN Outcome: Progressing   Problem: Safety: Goal: Ability to remain free from injury will improve 09/02/2020 0321 by Robyne Peers, RN Outcome: Progressing 09/02/2020 0316 by Robyne Peers, RN Outcome: Progressing   Problem: Skin Integrity: Goal: Risk for impaired skin integrity will decrease 09/02/2020 0321 by Robyne Peers, RN Outcome: Progressing 09/02/2020 0316 by Robyne Peers, RN Outcome: Progressing   Problem: Education: Goal: Ability to describe self-care measures that may prevent or decrease complications (Diabetes Survival Skills Education) will improve 09/02/2020 0321 by Robyne Peers, RN Outcome: Progressing 09/02/2020 0316 by Robyne Peers, RN Outcome: Progressing Goal: Individualized Educational Video(s) 09/02/2020 0321 by Robyne Peers, RN Outcome: Progressing 09/02/2020 0316 by Robyne Peers, RN Outcome: Progressing   Problem: Cardiac: Goal: Ability to maintain an adequate cardiac output will improve 09/02/2020 0321 by Robyne Peers, RN Outcome:  Progressing 09/02/2020 0316 by Robyne Peers, RN Outcome: Progressing   Problem: Health Behavior/Discharge Planning: Goal: Ability to identify and utilize available resources and services will improve 09/02/2020 0321 by Robyne Peers, RN Outcome: Progressing 09/02/2020 0316 by  Annlee Glandon, Noelle Penner, RN Outcome: Progressing Goal: Ability to manage health-related needs will improve 09/02/2020 0321 by Robyne Peers, RN Outcome: Progressing 09/02/2020 0316 by Robyne Peers, RN Outcome: Progressing   Problem: Fluid Volume: Goal: Ability to achieve a balanced intake and output will improve 09/02/2020 0321 by Robyne Peers, RN Outcome: Progressing 09/02/2020 0316 by Robyne Peers, RN Outcome: Progressing   Problem: Metabolic: Goal: Ability to maintain appropriate glucose levels will improve 09/02/2020 0321 by Robyne Peers, RN Outcome: Progressing 09/02/2020 0316 by Robyne Peers, RN Outcome: Progressing   Problem: Nutritional: Goal: Maintenance of adequate nutrition will improve 09/02/2020 0321 by Robyne Peers, RN Outcome: Progressing 09/02/2020 0316 by Robyne Peers, RN Outcome: Progressing Goal: Maintenance of adequate weight for body size and type will improve 09/02/2020 0321 by Robyne Peers, RN Outcome: Progressing 09/02/2020 0316 by Robyne Peers, RN Outcome: Progressing   Problem: Respiratory: Goal: Will regain and/or maintain adequate ventilation 09/02/2020 0321 by Robyne Peers, RN Outcome: Progressing 09/02/2020 0316 by Robyne Peers, RN Outcome: Progressing   Problem: Urinary Elimination: Goal: Ability to achieve and maintain adequate renal perfusion and functioning will improve 09/02/2020 0321 by Robyne Peers, RN Outcome: Progressing 09/02/2020 0316 by Robyne Peers, RN Outcome: Progressing

## 2020-09-02 NOTE — Progress Notes (Signed)
PROGRESS NOTE    [name].  WUJ:[id number] DOB: October 30, 1976 DOA: 08/29/2020 PCP: Timothy Riggs, NP   Chief Complaint  Patient presents with  . Chest Pain  . Emesis    Brief Narrative [name]. is [name] 44 y.o. male with history of diabetes mellitus type 1, chronic pain history of polysubstance abuse presents to the ER admits in [facility] with complaint of nausea vomiting over the last 2 to 3 days.  Patient states he usually is on [name] insulin pump but last week was not able to get [name] refill on his cartridges and he had to go back on [name] and his blood sugars were fluctuating and was not able to keep in good control.  Patient started developing nausea vomiting and diarrhea over the last 3 days and was unable to keep anything.  Denies any chest pain or shortness of breath.  ED Course: In the ER patient was tachycardic with labs showing bicarb of 12 creatinine 1.6 anion gap of 29 blood glucose of 476 urine drug screen was negative chest x-ray unremarkable EKG showing sinus tachycardia patient is admitted for diabetic ketoacidosis for which patient was started on insulin infusion fluids.  In the ER patient was found to be mildly confused and [name] CT head was done which was unremarkable.  By the time I examined the patient patient is alert awake and oriented to time place and person.  Assessment & Plan:   Principal Problem:   DKA (diabetic ketoacidosis) (HCC) Active Problems:   AKI (acute kidney injury) (HCC)  1. Secondary Degree AV block Type 2: noted on telemetry around 1518 on 10/22.  He was using bathroom around that time and noted lightheadedness.  Cardiology has been consulted. 1. Follow echo -> EF 60-65%, no RWMA (see report) 2. Follow TSH (5.726), follow free T4 3. Telemetry  4. Cardiology recommending outpatient follow up, no additional cards w/u here  2. Diabetic ketoacidosis  Hypoglycemia 1. Notes insurance stopped paying for pods for his pump and he's had issues  transitioning to basal/bolus regimen 2. Gap closed, BG's improved, continue basal bolus regimen and adjust as needed - hypoglycemia this AM, adjust lantus 3. BG's labile, will try to get [name] little better control prior to discharge 4. Follow A1c 11.2 5. Diabetic coordinator    3. Nausea  vomiting unclear etiology, continue to follow.  W/u additionally as needed.  Improved at this time.  4. Acute renal failure likely from volume depletion - improved after IVF  5. History of chronic pain  Hx Polysubstance Abuse on buprenorphine.  6. Acute metabolic encephalopathy likely 2/2 DKA.  Urine drug screen negative.  CT head unremarkable.  DVT prophylaxis: lovenox Code Status: full  Family Communication: none at bedside Disposition:   Status is: Inpatient  Remains inpatient appropriate because:Inpatient level of care appropriate due to severity of illness   Dispo: The patient is from: Home              Anticipated d/c is to: Home              Anticipated d/c date is: 1 day              Patient currently is not medically stable to d/c.  Consultants:   none  Procedures: Echo IMPRESSIONS    1. Left ventricular ejection fraction, by estimation, is 60 to 65%. The  left ventricle has normal function. The left ventricle has no regional  wall motion abnormalities. Left  ventricular diastolic parameters were  normal.  2. Right ventricular systolic function is normal. The right ventricular  size is normal. Tricuspid regurgitation signal is inadequate for assessing  PA pressure.  3. The mitral valve is normal in structure. Trivial mitral valve  regurgitation. No evidence of mitral stenosis.  4. The aortic valve is grossly normal. Aortic valve regurgitation is not  visualized. No aortic stenosis is present.  5. The inferior vena cava is dilated in size with <50% respiratory  variability, suggesting right atrial pressure of 15 mmHg.   Comparison(s): No prior Echocardiogram.    Antimicrobials:  Anti-infectives (From admission, onward)   None        Subjective: Feels generally tired  Objective: Vitals:   09/01/20 1404 09/01/20 2151 09/02/20 0450 09/02/20 1356  BP: 127/90 116/81 107/69 130/78  Pulse: 61 70 67 72  Resp: 18 16 16 18   Temp: 98.2 F (36.8 C) 98.1 F (36.7 C) 98 F (36.7 C) 98.1 F (36.7 C)  TempSrc:  Oral Oral   SpO2: 99% 98% 97% 100%  Weight:      Height:        Intake/Output Summary (Last 24 hours) at 09/02/2020 1629 Last data filed at 09/02/2020 1300 Gross per 24 hour  Intake 1440 ml  Output --  Net 1440 ml   Filed Weights   08/29/20 2314 08/30/20 0135  Weight: 74.3 kg 71.1 kg    Examination:  General: No acute distress. Cardiovascular: Heart sounds show [name] regular rate, and rhythm.  Lungs: Clear to auscultation bilaterally Abdomen: Soft, nontender, nondistended  Neurological: Alert and oriented 3. Moves all extremities 4. Cranial nerves II through XII grossly intact. Skin: Warm and dry. No rashes or lesions. Extremities: No clubbing or cyanosis. No edema.   Data Reviewed: I have personally reviewed following labs and imaging studies  CBC: Recent Labs  Lab 08/29/20 2118 08/29/20 2118 08/29/20 2119 08/30/20 0500 08/31/20 0420 09/01/20 0402 09/02/20 0316  WBC 14.4*  --   --  13.6* 5.4 4.5 3.5*  NEUTROABS  --   --   --   --  2.9 1.9 1.6*  HGB 14.9   < > 16.0 13.2 10.3* 11.3* 11.4*  HCT 44.4   < > 47.0 38.4* 30.1* 33.3* 35.0*  MCV 87.9  --   --  88.9 88.8 88.8 89.7  PLT 317  --   --  255 167 173 179   < > = values in this interval not displayed.    Basic Metabolic Panel: Recent Labs  Lab 08/30/20 1620 08/31/20 0420 09/01/20 0402 09/01/20 1238 09/02/20 0316  NA 134* 135 142 133* 136  K 3.5 3.7 3.0* 5.0 4.6  CL 98 103 103 94* 99  CO2 24 26 30 30 29   GLUCOSE 162* 252* 66* 482* 208*  BUN 19 14 8 13 14   CREATININE 1.14 0.80 0.75 0.86 0.71  CALCIUM 9.0 8.5* 9.1 9.1 8.9  MG  --  1.9 1.9  --  2.3   PHOS  --  3.0 3.9  --  4.4    GFR: Estimated Creatinine Clearance: 117.3 mL/min (by C-G formula based on SCr of 0.71 mg/dL).  Liver Function Tests: Recent Labs  Lab 08/29/20 2118 08/31/20 0420 09/01/20 0402 09/02/20 0316  AST 32 20 23 23   ALT 43 24 25 29   ALKPHOS 105 63 69 69  BILITOT 1.4* 0.8 0.6 0.5  PROT 7.7 5.4* 5.6* 6.3*  ALBUMIN 5.0 3.4* 3.6 4.1    CBG: Recent  Labs  Lab 09/01/20 1201 09/01/20 1654 09/01/20 2104 09/02/20 0725 09/02/20 1128  GLUCAP 472* 258* 108* 321* 240*     Recent Results (from the past 240 hour(s))  Respiratory Panel by RT PCR (Flu Kiyah Demartini&B, Covid) - Nasopharyngeal Swab     Status: None   Collection Time: 08/29/20  9:39 PM   Specimen: Nasopharyngeal Swab  Result Value Ref Range Status   SARS Coronavirus 2 by RT PCR NEGATIVE NEGATIVE Final    Comment: (NOTE) SARS-CoV-2 target nucleic acids are NOT DETECTED.  The SARS-CoV-2 RNA is generally detectable in upper respiratoy specimens during the acute phase of infection. The lowest concentration of SARS-CoV-2 viral copies this assay can detect is 131 copies/mL. Domnic Vantol negative result does not preclude SARS-Cov-2 infection and should not be used as the sole basis for treatment or other patient management decisions. Reanna Scoggin negative result may occur with  improper specimen collection/handling, submission of specimen other than nasopharyngeal swab, presence of viral mutation(s) within the areas targeted by this assay, and inadequate number of viral copies (<131 copies/mL). Audyn Dimercurio negative result must be combined with clinical observations, patient history, and epidemiological information. The expected result is Negative.  Fact Sheet for Patients:  https://www.moore.com/  Fact Sheet for Healthcare Providers:  https://www.young.biz/  This test is no t yet approved or cleared by the [place] FDA and  has been authorized for detection and/or diagnosis of SARS-CoV-2  by FDA under an Emergency Use Authorization (EUA). This EUA will remain  in effect (meaning this test can be used) for the duration of the COVID-19 declaration under Section 564(b)(1) of the Act, 21 U.S.C. section 360bbb-3(b)(1), unless the authorization is terminated or revoked sooner.     Influenza Balian Schaller by PCR NEGATIVE NEGATIVE Final   Influenza B by PCR NEGATIVE NEGATIVE Final    Comment: (NOTE) The Xpert Xpress SARS-CoV-2/FLU/RSV assay is intended as an aid in  the diagnosis of influenza from Nasopharyngeal swab specimens and  should not be used as [name] sole basis for treatment. Nasal washings and  aspirates are unacceptable for Xpert Xpress SARS-CoV-2/FLU/RSV  testing.  Fact Sheet for Patients: https://www.moore.com/  Fact Sheet for Healthcare Providers: https://www.young.biz/  This test is not yet approved or cleared by the [place] FDA and  has been authorized for detection and/or diagnosis of SARS-CoV-2 by  FDA under an Emergency Use Authorization (EUA). This EUA will remain  in effect (meaning this test can be used) for the duration of the  Covid-19 declaration under Section 564(b)(1) of the Act, 21  U.S.C. section 360bbb-3(b)(1), unless the authorization is  terminated or revoked. Performed at [facility], [address] [place], [place] [address]   MRSA PCR Screening     Status: None   Collection Time: 08/30/20  1:27 AM   Specimen: Nasal Mucosa; Nasopharyngeal  Result Value Ref Range Status   MRSA by PCR NEGATIVE NEGATIVE Final    Comment:        The GeneXpert MRSA Assay (FDA approved for NASAL specimens only), is one component of Lisette Mancebo comprehensive MRSA colonization surveillance program. It is not intended to diagnose MRSA infection nor to guide or monitor treatment for MRSA infections. Performed at [facility], [address], [place], [place] [address]          Radiology  Studies: ECHOCARDIOGRAM COMPLETE  Result Date: 09/01/2020    ECHOCARDIOGRAM REPORT   Patient Name:   [name]. Date of Exam: 09/01/2020 Medical Rec #:  [id number]            Height:       73.0 in Accession #:    [id number]           Weight:       156.7 lb Date of Birth:  1976/03/21           BSA:          1.940 m Patient Age:    43 years             BP:           126/78 mmHg Patient Gender: M                    HR:           62 bpm. Exam Location:  Inpatient Procedure: 2D Echo, Color Doppler and Cardiac Doppler Indications:    R00.1 Bradycardia, unspecified  History:        Patient has no prior history of Echocardiogram examinations.                 COPD; Risk Factors:Hypertension, Diabetes, Dyslipidemia and                 Polysubstance abuse.  Sonographer:    Irving Burton Senior RDCS Referring Phys: [phone] Yash Cacciola CALDWELL POWELL JR IMPRESSIONS  1. Left ventricular ejection fraction, by estimation, is 60 to 65%. The left ventricle has normal function. The left ventricle has no regional wall motion abnormalities. Left ventricular diastolic parameters were normal.  2. Right ventricular systolic function is normal. The right ventricular size is normal. Tricuspid regurgitation signal is inadequate for assessing PA pressure.  3. The mitral valve is normal in structure. Trivial mitral valve regurgitation. No evidence of mitral stenosis.  4. The aortic valve is grossly normal. Aortic valve regurgitation is not visualized. No aortic stenosis is present.  5. The inferior vena cava is dilated in size with <50% respiratory variability, suggesting right atrial pressure of 15 mmHg. Comparison(s): No prior Echocardiogram. Conclusion(s)/Recommendation(s): Normal biventricular function without evidence of hemodynamically significant valvular heart disease. FINDINGS  Left Ventricle: Left ventricular ejection fraction, by estimation, is 60 to 65%. The left ventricle has normal function. The left ventricle has no regional wall  motion abnormalities. The left ventricular internal cavity size was normal in size. There is  no left ventricular hypertrophy. Left ventricular diastolic parameters were normal. Right Ventricle: The right ventricular size is normal. No increase in right ventricular wall thickness. Right ventricular systolic function is normal. Tricuspid regurgitation signal is inadequate for assessing PA pressure. Left Atrium: Left atrial size was normal in size. Right Atrium: Right atrial size was normal in size. Pericardium: There is no evidence of pericardial effusion. Mitral Valve: The mitral valve is normal in structure. Trivial mitral valve regurgitation. No evidence of mitral valve stenosis. Tricuspid Valve: The tricuspid valve is normal in structure. Tricuspid valve regurgitation is trivial. No evidence of tricuspid stenosis. Aortic Valve: The aortic valve is grossly normal. Aortic valve regurgitation is not visualized. No aortic stenosis is present. Pulmonic Valve: The pulmonic valve was not well visualized. Pulmonic valve regurgitation is not visualized. Aorta: The ascending aorta was not well visualized and the aortic root is normal in size and structure. Venous: The inferior vena cava is dilated in size with less than 50% respiratory variability, suggesting right atrial pressure of 15 mmHg. IAS/Shunts: The atrial septum is grossly normal.  LEFT VENTRICLE PLAX 2D LVIDd:  4.80 cm  Diastology LVIDs:         2.90 cm  LV e' medial:    10.70 cm/s LV PW:         1.00 cm  LV E/e' medial:  7.8 LV IVS:        0.90 cm  LV e' lateral:   14.50 cm/s LVOT diam:     2.30 cm  LV E/e' lateral: 5.7 LV SV:         99 LV SV Index:   51 LVOT Area:     4.15 cm  RIGHT VENTRICLE RV S prime:     13.70 cm/s TAPSE (M-mode): 2.6 cm LEFT ATRIUM             Index       RIGHT ATRIUM           Index LA diam:        3.60 cm 1.86 cm/m  RA Area:     11.60 cm LA Vol (A2C):   62.9 ml 32.42 ml/m RA Volume:   24.50 ml  12.63 ml/m LA Vol (A4C):    41.8 ml 21.55 ml/m LA Biplane Vol: 56.1 ml 28.92 ml/m  AORTIC VALVE LVOT Vmax:   106.00 cm/s LVOT Vmean:  71.500 cm/s LVOT VTI:    0.239 m  AORTA Ao Root diam: 3.80 cm MITRAL VALVE MV Area (PHT): 3.48 cm    SHUNTS MV Decel Time: 218 msec    Systemic VTI:  0.24 m MV E velocity: 83.10 cm/s  Systemic Diam: 2.30 cm MV Shevy Yaney velocity: 69.00 cm/s MV E/Marielouise Amey ratio:  1.20 Jodelle Red MD Electronically signed by Jodelle Red MD Signature Date/Time: 09/01/2020/10:13:59 AM    Final         Scheduled Meds: . (feeding supplement) PROSource Plus  30 mL Oral TID BM  . buprenorphine-naloxone  1 tablet Sublingual TID  . chlorhexidine  15 mL Mouth Rinse BID  . enoxaparin (LOVENOX) injection  40 mg Subcutaneous Q24H  . famotidine  20 mg Oral BID  . feeding supplement (KATE FARMS STANDARD 1.4)  325 mL Oral Daily  . insulin aspart  0-15 Units Subcutaneous TID WC  . insulin aspart  0-5 Units Subcutaneous QHS  . insulin aspart  4 Units Subcutaneous TID WC  . insulin glargine  20 Units Subcutaneous Q24H  . multivitamin with minerals  1 tablet Oral Daily  . polyethylene glycol  17 g Oral Daily  . traZODone  100 mg Oral QHS   Continuous Infusions:    LOS: 3 days    Time spent: over 30 min    Lacretia Nicks, MD [facility]   To contact the attending provider between 7A-7P or the covering provider during after hours 7P-7A, please log into the web site [email or web address] and access using universal [place] password for that web site. If you do not have the password, please call the hospital operator.  09/02/2020, 4:29 PM

## 2020-09-03 DIAGNOSIS — E1010 Type 1 diabetes mellitus with ketoacidosis without coma: Secondary | ICD-10-CM | POA: Diagnosis not present

## 2020-09-03 LAB — GLUCOSE, CAPILLARY
Glucose-Capillary: 332 mg/dL — ABNORMAL HIGH (ref 70–99)
Glucose-Capillary: 315 mg/dL — ABNORMAL HIGH (ref 70–99)

## 2020-09-03 LAB — CBC WITH DIFFERENTIAL/PLATELET
Abs Immature Granulocytes: 0.01 10*3/uL (ref 0.00–0.07)
Basophils Absolute: 0 10*3/uL (ref 0.0–0.1)
Basophils Relative: 1 %
Eosinophils Absolute: 0.3 10*3/uL (ref 0.0–0.5)
Eosinophils Relative: 7 %
HCT: 37.2 % — ABNORMAL LOW (ref 39.0–52.0)
Hemoglobin: 12.6 g/dL — ABNORMAL LOW (ref 13.0–17.0)
Immature Granulocytes: 0 %
Lymphocytes Relative: 41 %
Lymphs Abs: 1.6 10*3/uL (ref 0.7–4.0)
MCH: 30.1 pg (ref 26.0–34.0)
MCHC: 33.9 g/dL (ref 30.0–36.0)
MCV: 89 fL (ref 80.0–100.0)
Monocytes Absolute: 0.5 10*3/uL (ref 0.1–1.0)
Monocytes Relative: 12 %
Neutro Abs: 1.6 10*3/uL — ABNORMAL LOW (ref 1.7–7.7)
Neutrophils Relative %: 39 %
Platelets: 175 10*3/uL (ref 150–400)
RBC: 4.18 MIL/uL — ABNORMAL LOW (ref 4.22–5.81)
RDW: 12.5 % (ref 11.5–15.5)
WBC: 4 10*3/uL (ref 4.0–10.5)
nRBC: 0 % (ref 0.0–0.2)

## 2020-09-03 LAB — COMPREHENSIVE METABOLIC PANEL
ALT: 29 U/L (ref 0–44)
AST: 29 U/L (ref 15–41)
Albumin: 4.1 g/dL (ref 3.5–5.0)
Alkaline Phosphatase: 80 U/L (ref 38–126)
Anion gap: 7 (ref 5–15)
BUN: 19 mg/dL (ref 6–20)
CO2: 30 mmol/L (ref 22–32)
Calcium: 9.4 mg/dL (ref 8.9–10.3)
Chloride: 97 mmol/L — ABNORMAL LOW (ref 98–111)
Creatinine, Ser: 0.83 mg/dL (ref 0.61–1.24)
GFR, Estimated: >60 mL/min (ref >60)
Glucose, Bld: 352 mg/dL — ABNORMAL HIGH (ref 70–99)
Potassium: 5.4 mmol/L — ABNORMAL HIGH (ref 3.5–5.1)
Sodium: 134 mmol/L — ABNORMAL LOW (ref 135–145)
Total Bilirubin: 0.4 mg/dL (ref 0.3–1.2)
Total Protein: 6.4 g/dL — ABNORMAL LOW (ref 6.5–8.1)

## 2020-09-03 LAB — PHOSPHORUS: Phosphorus: 4.6 mg/dL (ref 2.5–4.6)

## 2020-09-03 LAB — POTASSIUM: Potassium: 5 mmol/L (ref 3.5–5.1)

## 2020-09-03 LAB — MAGNESIUM: Magnesium: 2.3 mg/dL (ref 1.7–2.4)

## 2020-09-03 MED ORDER — INSULIN LISPRO (1 UNIT DIAL) 100 UNIT/ML (KWIKPEN): 8.0000 [IU] | PEN_INJECTOR | Freq: Three times a day (TID) | SUBCUTANEOUS | 11 refills | Status: AC

## 2020-09-03 MED ORDER — INSULIN LISPRO (1 UNIT DIAL) 100 UNIT/ML (KWIKPEN)
0.0000 [IU] | PEN_INJECTOR | Freq: Three times a day (TID) | SUBCUTANEOUS | 11 refills | Status: AC
Start: 2020-09-03 — End: ?

## 2020-09-03 MED ORDER — INSULIN GLARGINE 100 UNIT/ML ~~LOC~~ SOLN
25.0000 [IU] | SUBCUTANEOUS | Status: DC
  Administered 2020-09-03: 25 [IU] via SUBCUTANEOUS
  Filled 2020-09-03: qty 0.25

## 2020-09-03 MED ORDER — INSULIN ASPART 100 UNIT/ML ~~LOC~~ SOLN: 8.0000 [IU] | Freq: Three times a day (TID) | SUBCUTANEOUS | 11 refills | Status: DC

## 2020-09-03 MED ORDER — TRESIBA FLEXTOUCH 200 UNIT/ML ~~LOC~~ SOPN: 12.0000 [IU] | PEN_INJECTOR | Freq: Two times a day (BID) | SUBCUTANEOUS | 0 refills | Status: AC

## 2020-09-03 MED ORDER — INSULIN ASPART 100 UNIT/ML ~~LOC~~ SOLN: 8.0000 [IU] | Freq: Three times a day (TID) | SUBCUTANEOUS | Status: DC

## 2020-09-03 MED ORDER — TRESIBA FLEXTOUCH 200 UNIT/ML ~~LOC~~ SOPN: 13.0000 [IU] | PEN_INJECTOR | Freq: Two times a day (BID) | SUBCUTANEOUS | 0 refills | Status: DC

## 2020-09-03 NOTE — Discharge Instructions (Signed)
Sliding Scale Insulin For Blood Glucose 70 - 120 give 0 units For Blood Glucose 121-150 give 2 units For blood glucose 151-200 give 3 units For blood glucose 201-250 give 5 units For blood glucose 251-300 give 8 units For blood glucose 301-350 give 11 units For blood glucose 351-400 give 15 units For blood sugar over 400 call MD

## 2020-09-03 NOTE — Progress Notes (Addendum)
Inpatient Diabetes Program Recommendations  AACE/ADA: New Consensus Statement on Inpatient Glycemic Control (2015)  Target Ranges:  Prepandial:   less than 140 mg/dL      Peak postprandial:   less than 180 mg/dL (1-2 hours)      Critically ill patients:  140 - 180 mg/dL   Results for Rojek, Babak Arma Heading (MRN [id number]) as of 09/03/2020 07:59  Ref. Range 09/01/2020 07:37 09/01/2020 09:52 09/01/2020 12:01 09/01/2020 16:54 09/01/2020 21:04  Glucose-Capillary Latest Ref Range: 70 - 99 mg/dL 66 (L) 287 (H)  15 units LANTUS @11am  472 (H)  19 units NOVOLOG  258 (H)  12 units NOVOLOG  108 (H)   Results for Hofmann, Salil (MRN Arma Heading) as of 09/03/2020 07:59  Ref. Range 09/02/2020 07:25 09/02/2020 11:28 09/02/2020 17:15 09/02/2020 21:44  Glucose-Capillary Latest Ref Range: 70 - 99 mg/dL 09/04/2020 (H)  15 units NOVOLOG +  20 units LANTUS @9 :36am  240 (H)  9 units NOVOLOG  154 (H)  7 units NOVOLOG  191 (H)   Results for Wessling, Rodrigues 035 (MRN ) as of 09/03/2020 07:59  Ref. Range 09/03/2020 07:37  Glucose-Capillary Latest Ref Range: 70 - 99 mg/dL 09/05/2020 (H)    Home DM Meds: Tresiba 25 units QHS         Humalog 10-20 units tidwc       Insulin pump (stopped Pump 10/14 when he had issues with his Insulin Pump pods)   Current Orders: Lantus 20 units Q24H      Novolog Moderate Correction Scale/ SSI (0-15 units) TID AC + HS      Novolog 4 units TID with meals     MD- Note AM CBGs >300 the last 2 days.  Also having issues with post-meal elevations.  Please consider:  1. Increase/Change the Lantus to 13 units BID (0.35 units/kg) Unsure if once daily Lantus is lasting all day?? BID dosing may help better cover basal needs  2. Increase the Novolog Meal Coverage to 8 units TID with meals    Addendum 11:20am--Note that RN 09/05/2020 paged the diabetes team.  Called RN back to see what pt's needs were.  RN told me pt was wanting info on what insulin pumps his  insurance would cover.  Discussed with RN that pt should contact his ENDO office to seek assistance on finding out which pumps are covered by his insurance.  His ENDO team will likely be able to help him navigate his insurance coverage and will be able to recommend a different insulin pump.  Since the diabetes team does not initiate (initial) insulin pump therapy, we do not have info on different insurance coverages for insulin pumps.  This would be an outpatient ENDO task.  --Will follow patient during hospitalization--  536 RN, MSN, CDE Diabetes Coordinator Inpatient Glycemic Control Team Team Pager: [phone] (8a-5p)

## 2020-09-03 NOTE — Discharge Summary (Signed)
Physician Discharge Summary  [name]. LNL:[id number] DOB: 31-Aug-1976 DOA: 08/29/2020  PCP: [name], NP  Admit date: 08/29/2020 Discharge date: 09/03/2020  Time spent: 49 minutes  Recommendations for Outpatient Follow-up:  1. Follow outpatient CBC/CMP 2. Follow blood sugars outpatient with basal/bolus and SSI regimen, adjust outpatient with endocrinologist as needed  3. Continue to follow up with endocrinology regarding insulin pump 4. Follow up with cardiology outpatient   Discharge Diagnoses:  Principal Problem:   DKA (diabetic ketoacidosis) ([place]) Active Problems:   AKI (acute kidney injury) ([place])   Discharge Condition: stable  Diet recommendation: diabetic  Filed Weights   08/29/20 2314 08/30/20 0135  Weight: 74.3 kg 71.1 kg    History of present illness:  Timothy Rodriguezis [name] 44 y.o.malewithhistory of diabetes mellitus type 1, chronic pain history of polysubstance abuse presents to the ER admits in [facility] with complaint of nausea vomiting over the last 2 to 3 days. Patient states he usually is on [name] insulin pump but last week was not able to get [name] refill on his cartridges and he had to go back on [place] and his blood sugars were fluctuating and was not able to keep in good control. Patient started developing nausea vomiting and diarrhea over the last 3 days and was unable to keep anything. Denies any chest pain or shortness of breath.  ED Course:In the ER patient was tachycardic with labs showing bicarb of 12 creatinine 1.6 anion gap of 29 blood glucose of 476 urine drug screen was negative chest x-ray unremarkable EKG showing sinus tachycardia patient is admitted for diabetic ketoacidosis for which patient was started on insulin infusion fluids. In the ER patient was found to be mildly confused and [name] CT head was done which was unremarkable. By the time I examined the patient patient is alert awake and oriented to time place and person.  He  was admitted for DKA and has improved with insulin.  He's being discharged on 10/25 in stable condition.    See below for additional recommendations  Hospital Course:  1. Secondary Degree AV block Type 2: noted on telemetry around 1518 on 10/22.  He was using bathroom around that time and noted lightheadedness.  Cardiology has been consulted. 1. Follow echo -> EF 60-65%, no RWMA (see report) 2. Follow TSH (5.726), follow free T4 3. Telemetry  4. Cardiology recommending outpatient follow up, no additional cards w/u here - discussed consider outpatient follow up with pt, recommended discuss with PCP  2. Diabetic ketoacidosis   Hypoglycemia 1. Notes insurance stopped paying for pods for his pump and he's had issues transitioning to basal/bolus regimen 2. Gap closed, BG's improved, continue basal bolus regimen and adjust as needed - hypoglycemia this AM, adjust lantus 3. BG's continue to be labile -> d/c with bid tresiba, mealtime, and SSI -> needs outpatient follow up  4. Follow A1c 11.2 5. Diabetic coordinator    3. Nausea   vomiting unclear etiology, continue to follow.  W/u additionally as needed.  Improved at this time.  4. Acute renal failure likely from volume depletion - improved after IVF  5. History of chronic pain   Hx Polysubstance Abuse on buprenorphine.  6. Acute metabolic encephalopathy likely 2/2 DKA. Urine drug screen negative. CT head unremarkable.  Procedures:  Echo IMPRESSIONS    1. Left ventricular ejection fraction, by estimation, is 60 to 65%. The  left ventricle has normal function. The left ventricle has no regional  wall motion  abnormalities. Left ventricular diastolic parameters were  normal.  2. Right ventricular systolic function is normal. The right ventricular  size is normal. Tricuspid regurgitation signal is inadequate for assessing  PA pressure.  3. The mitral valve is normal in structure. Trivial mitral valve  regurgitation. No evidence  of mitral stenosis.  4. The aortic valve is grossly normal. Aortic valve regurgitation is not  visualized. No aortic stenosis is present.  5. The inferior vena cava is dilated in size with <50% respiratory  variability, suggesting right atrial pressure of 15 mmHg.   Comparison(s): No prior Echocardiogram.   Consultations:  none  Discharge Exam: Vitals:   09/02/20 1937 09/03/20 0342  BP: 123/84 101/67  Pulse: 72 66  Resp: 15 18  Temp: 98 F (36.7 C) 97.7 F (36.5 C)  SpO2: 99% 95%   No new complaints today Eager to d/c  General: No acute distress. Cardiovascular: Heart sounds show [name] regular rate, and rhythm. Lungs: Clear to auscultation bilaterally  Abdomen: Soft, nontender, nondistended Neurological: Alert and oriented 3. Moves all extremities 4 . Cranial nerves II through XII grossly intact. Skin: Warm and dry. No rashes or lesions. Extremities: No clubbing or cyanosis. No edema.   Discharge Instructions   Discharge Instructions    Call MD for:  difficulty breathing, headache or visual disturbances   Complete by: As directed    Call MD for:  extreme fatigue   Complete by: As directed    Call MD for:  hives   Complete by: As directed    Call MD for:  persistant dizziness or light-headedness   Complete by: As directed    Call MD for:  persistant nausea and vomiting   Complete by: As directed    Call MD for:  redness, tenderness, or signs of infection (pain, swelling, redness, odor or green/yellow discharge around incision site)   Complete by: As directed    Call MD for:  severe uncontrolled pain   Complete by: As directed    Call MD for:  temperature >100.4   Complete by: As directed    Diet - low sodium heart healthy   Complete by: As directed    Discharge instructions   Complete by: As directed    You were seen for DKA.  You've improved with insulin therapy.  Please call your endocrinologist for [name] follow up appointment to make further adjustments to  your insulin regimen.    We're going to send you home with twice daily dosing of your long acting insulin to see if this helps give better control.  You should also take 8 units of lispro with meals in addition to [name] sliding scale (see below for sliding scale).  Your basal and mealtime insulin doses will need to be adjusted over time, so please follow with your PCP or endocrinologist for assistance with this.   Return for new, recurrent, or worsening symptoms.  Please ask your PCP to request records from this hospitalization so they know what was done and what the next steps will be.  Sliding Scale Insulin For Blood Glucose 70 - 120 give 0 units For Blood Glucose 121-150 give 2 units For blood glucose 151-200 give 3 units For blood glucose 201-250 give 5 units For blood glucose 251-300 give 8 units For blood glucose 301-350 give 11 units For blood glucose 351-400 give 15 units For blood sugar over 400 call MD   Increase activity slowly   Complete by: As directed  Allergies as of 09/03/2020      Reactions   Tramadol Other (See Comments)   SERATONIN SYNDROME DIZZINESS SERATONIN SYNDROME DIZZINESS SERATONIN SYNDROME DIZZINESS Causes seratonin syndrome   Ciprofloxacin Hives      Medication List    STOP taking these medications   insulin glargine 100 UNIT/ML injection Commonly known as: LANTUS     TAKE these medications   blood glucose meter kit and supplies Kit Dispense based on patient and insurance preference. Use up to four times daily as directed. (FOR ICD-9 250.00, 250.01).   buprenorphine-naloxone 8-2 mg Subl SL tablet Commonly known as: SUBOXONE Place 1 tablet under the tongue in the morning, at noon, and at bedtime.   insulin lispro 100 UNIT/ML KwikPen Commonly known as: HumaLOG KwikPen Inject 8 Units into the skin 3 (three) times daily with meals. Hold if not eating or eating less than 50% of meal What changed:   how much to take  when to take  this  additional instructions   insulin lispro 100 UNIT/ML KwikPen Commonly known as: HUMALOG Inject 0-15 Units into the skin 3 (three) times daily with meals. Per sliding scale in your discharge summary. What changed: You were already taking [name] medication with the same name, and this prescription was added. Make sure you understand how and when to take each.   traZODone 100 MG tablet Commonly known as: DESYREL Take 100 mg by mouth at bedtime.   Tyler Aas FlexTouch 200 UNIT/ML FlexTouch Pen Generic drug: insulin degludec Inject 12 Units into the skin 2 (two) times daily. What changed:   how much to take  when to take this      Allergies  Allergen Reactions   Tramadol Other (See Comments)    SERATONIN SYNDROME DIZZINESS SERATONIN SYNDROME DIZZINESS SERATONIN SYNDROME DIZZINESS Causes seratonin syndrome   Ciprofloxacin Hives      The results of significant diagnostics from this hospitalization (including imaging, microbiology, ancillary and laboratory) are listed below for reference.    Significant Diagnostic Studies: CT HEAD WO CONTRAST  Result Date: 08/29/2020 CLINICAL DATA:  Altered mental status. EXAM: CT HEAD WITHOUT CONTRAST TECHNIQUE: Contiguous axial images were obtained from the base of the skull through the vertex without intravenous contrast. COMPARISON:  July 14, 2017 FINDINGS: Brain: No evidence of acute infarction, hemorrhage, hydrocephalus, extra-axial collection or mass lesion/mass effect. Vascular: No hyperdense vessel or unexpected calcification. Skull: Normal. Negative for fracture or focal lesion. Sinuses/Orbits: There is mild left ethmoid sinus mucosal thickening. Angelisa Winthrop 1.5 cm x 1.2 cm polyp versus mucous retention cyst is seen within the posterior aspect of the left maxillary sinus. This is present on the prior study. Other: None. IMPRESSION: 1. No acute intracranial abnormality. 2. Mild left ethmoid sinus disease. 3. 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 Electronically Signed   By: Virgina Norfolk M.D.   On: 08/29/2020 21:32   DG Chest Port 1 View  Result Date: 08/29/2020 CLINICAL DATA:  Altered mental status EXAM: PORTABLE CHEST 1 VIEW COMPARISON:  05/04/2019 FINDINGS: The heart size and mediastinal contours are within normal limits. Both lungs are clear. The visualized skeletal structures are unremarkable. IMPRESSION: No active disease. Electronically Signed   By: Inez Catalina M.D.   On: 08/29/2020 21:32   ECHOCARDIOGRAM COMPLETE  Result Date: 09/01/2020    ECHOCARDIOGRAM REPORT   Patient Name:   Mase Dhondt. Date of Exam: 09/01/2020 Medical Rec #:  [id number]  Height:       73.0 in Accession #:    [id number]           Weight:       156.7 lb Date of Birth:  19-Aug-1976           BSA:          1.940 m Patient Age:    44 years             BP:           126/78 mmHg Patient Gender: M                    HR:           62 bpm. Exam Location:  Inpatient Procedure: 2D Echo, Color Doppler and Cardiac Doppler Indications:    R00.1 Bradycardia, unspecified  History:        Patient has no prior history of Echocardiogram examinations.                 COPD; Risk Factors:Hypertension, Diabetes, Dyslipidemia and                 Polysubstance abuse.  Sonographer:    [name] Senior RDCS Referring Phys: [phone] Judea Fennimore CALDWELL [place]  1. Left ventricular ejection fraction, by estimation, is 60 to 65%. The left ventricle has normal function. The left ventricle has no regional wall motion abnormalities. Left ventricular diastolic parameters were normal.  2. Right ventricular systolic function is normal. The right ventricular size is normal. Tricuspid regurgitation signal is inadequate for assessing PA pressure.  3. The mitral valve is normal in structure. Trivial mitral valve regurgitation. No evidence of mitral stenosis.  4. The aortic valve is grossly normal. Aortic valve regurgitation is not visualized. No aortic  stenosis is present.  5. The inferior vena cava is dilated in size with <50% respiratory variability, suggesting right atrial pressure of 15 mmHg. Comparison(s): No prior Echocardiogram. Conclusion(s)/Recommendation(s): Normal biventricular function without evidence of hemodynamically significant valvular heart disease. FINDINGS  Left Ventricle: Left ventricular ejection fraction, by estimation, is 60 to 65%. The left ventricle has normal function. The left ventricle has no regional wall motion abnormalities. The left ventricular internal cavity size was normal in size. There is  no left ventricular hypertrophy. Left ventricular diastolic parameters were normal. Right Ventricle: The right ventricular size is normal. No increase in right ventricular wall thickness. Right ventricular systolic function is normal. Tricuspid regurgitation signal is inadequate for assessing PA pressure. Left Atrium: Left atrial size was normal in size. Right Atrium: Right atrial size was normal in size. Pericardium: There is no evidence of pericardial effusion. Mitral Valve: The mitral valve is normal in structure. Trivial mitral valve regurgitation. No evidence of mitral valve stenosis. Tricuspid Valve: The tricuspid valve is normal in structure. Tricuspid valve regurgitation is trivial. No evidence of tricuspid stenosis. Aortic Valve: The aortic valve is grossly normal. Aortic valve regurgitation is not visualized. No aortic stenosis is present. Pulmonic Valve: The pulmonic valve was not well visualized. Pulmonic valve regurgitation is not visualized. Aorta: The ascending aorta was not well visualized and the aortic root is normal in size and structure. Venous: The inferior vena cava is dilated in size with less than 50% respiratory variability, suggesting right atrial pressure of 15 mmHg. IAS/Shunts: The atrial septum is grossly normal.  LEFT VENTRICLE PLAX 2D LVIDd:         4.80 cm  Diastology LVIDs:  2.90 cm  LV e' medial:     10.70 cm/s LV PW:         1.00 cm  LV E/e' medial:  7.8 LV IVS:        0.90 cm  LV e' lateral:   14.50 cm/s LVOT diam:     2.30 cm  LV E/e' lateral: 5.7 LV SV:         99 LV SV Index:   51 LVOT Area:     4.15 cm  RIGHT VENTRICLE RV S prime:     13.70 cm/s TAPSE (M-mode): 2.6 cm LEFT ATRIUM             Index       RIGHT ATRIUM           Index LA diam:        3.60 cm 1.86 cm/m  RA Area:     11.60 cm LA Vol (A2C):   62.9 ml 32.42 ml/m RA Volume:   24.50 ml  12.63 ml/m LA Vol (A4C):   41.8 ml 21.55 ml/m LA Biplane Vol: 56.1 ml 28.92 ml/m  AORTIC VALVE LVOT Vmax:   106.00 cm/s LVOT Vmean:  71.500 cm/s LVOT VTI:    0.239 m  AORTA Ao Root diam: 3.80 cm MITRAL VALVE MV Area (PHT): 3.48 cm    SHUNTS MV Decel Time: 218 msec    Systemic VTI:  0.24 m MV E velocity: 83.10 cm/s  Systemic Diam: 2.30 cm MV Nori Poland velocity: 69.00 cm/s MV E/Kieon Lawhorn ratio:  1.20 Buford Dresser MD Electronically signed by Buford Dresser MD Signature Date/Time: 09/01/2020/10:13:59 AM    Final     Microbiology: Recent Results (from the past 240 hour(s))  Respiratory Panel by RT PCR (Flu Shareena Nusz&B, Covid) - Nasopharyngeal Swab     Status: None   Collection Time: 08/29/20  9:39 PM   Specimen: Nasopharyngeal Swab  Result Value Ref Range Status   SARS Coronavirus 2 by RT PCR NEGATIVE NEGATIVE Final    Comment: (NOTE) SARS-CoV-2 target nucleic acids are NOT DETECTED.  The SARS-CoV-2 RNA is generally detectable in upper respiratoy specimens during the acute phase of infection. The lowest concentration of SARS-CoV-2 viral copies this assay can detect is 131 copies/mL. Nahun Kronberg negative result does not preclude SARS-Cov-2 infection and should not be used as the sole basis for treatment or other patient management decisions. Cinderella Christoffersen negative result may occur with  improper specimen collection/handling, submission of specimen other than nasopharyngeal swab, presence of viral mutation(s) within the areas targeted by this assay, and inadequate number of  viral copies (<131 copies/mL). Sarann Tregre negative result must be combined with clinical observations, patient history, and epidemiological information. The expected result is Negative.  Fact Sheet for Patients:  PinkCheek.be  Fact Sheet for Healthcare Providers:  GravelBags.it  This test is no t yet approved or cleared by the [place] FDA and  has been authorized for detection and/or diagnosis of SARS-CoV-2 by FDA under an Emergency Use Authorization (EUA). This EUA will remain  in effect (meaning this test can be used) for the duration of the COVID-19 declaration under Section 564(b)(1) of the Act, 21 U.S.C. section 360bbb-3(b)(1), unless the authorization is terminated or revoked sooner.     Influenza Wiliam Cauthorn by PCR NEGATIVE NEGATIVE Final   Influenza B by PCR NEGATIVE NEGATIVE Final    Comment: (NOTE) The Xpert Xpress SARS-CoV-2/FLU/RSV assay is intended as an aid in  the diagnosis of influenza from Nasopharyngeal swab specimens and  should  not be used as Karlton Maya sole basis for treatment. Nasal washings and  aspirates are unacceptable for Xpert Xpress SARS-CoV-2/FLU/RSV  testing.  Fact Sheet for Patients: PinkCheek.be  Fact Sheet for Healthcare Providers: GravelBags.it  This test is not yet approved or cleared by the [place] FDA and  has been authorized for detection and/or diagnosis of SARS-CoV-2 by  FDA under an Emergency Use Authorization (EUA). This EUA will remain  in effect (meaning this test can be used) for the duration of the  Covid-19 declaration under Section 564(b)(1) of the Act, 21  U.S.C. section 360bbb-3(b)(1), unless the authorization is  terminated or revoked. Performed at [facility], [address] [place], [place] [address]   MRSA PCR Screening     Status: None   Collection Time: 08/30/20  1:27 AM   Specimen: Nasal Mucosa;  Nasopharyngeal  Result Value Ref Range Status   MRSA by PCR NEGATIVE NEGATIVE Final    Comment:        The GeneXpert MRSA Assay (FDA approved for NASAL specimens only), is one component of Felicia Bloomquist comprehensive MRSA colonization surveillance program. It is not intended to diagnose MRSA infection nor to guide or monitor treatment for MRSA infections. Performed at [facility], [place] [address] [place],  [address]      Labs: Basic Metabolic Panel: Recent Labs  Lab 08/31/20 0420 08/31/20 0420 09/01/20 0402 09/01/20 1238 09/02/20 0316 09/03/20 0414 09/03/20 1154  NA 135  --  142 133* 136 134*  --   K 3.7   < > 3.0* 5.0 4.6 5.4* 5.0  CL 103  --  103 94* 99 97*  --   CO2 26  --  _0 --   GLUCOSE 252*  --  66* 482* 208* 352*  --   BUN 14  --  _1 --   CREATININE 0.80  --  0.75 0.86 0.71 0.83  --   CALCIUM 8.5*  --  9.1 9.1 8.9 9.4  --   MG 1.9  --  1.9  --  2.3 2.3  --   PHOS 3.0  --  3.9  --  4.4 4.6  --    < > = values in this interval not displayed.   Liver Function Tests: Recent Labs  Lab 08/29/20 2118 08/31/20 0420 09/01/20 0402 09/02/20 0316 09/03/20 0414  AST 32 _2 ALT 43 _3 ALKPHOS 105 63 69 69 80  BILITOT 1.4* 0.8 0.6 0.5 0.4  PROT 7.7 5.4* 5.6* 6.3* 6.4*  ALBUMIN 5.0 3.4* 3.6 4.1 4.1   Recent Labs  Lab 08/29/20 2118  LIPASE 18   No results for input(s): AMMONIA in the last 168 hours. CBC: Recent Labs  Lab 08/30/20 0500 08/31/20 0420 09/01/20 0402 09/02/20 0316 09/03/20 0414  WBC 13.6* 5.4 4.5 3.5* 4.0  NEUTROABS  --  2.9 1.9 1.6* 1.6*  HGB 13.2 10.3* 11.3* 11.4* 12.6*  HCT 38.4* 30.1* 33.3* 35.0* 37.2*  MCV 88.9 88.8 88.8 89.7 89.0  PLT 255 167 173 179 175   Cardiac Enzymes: No results for input(s): CKTOTAL, CKMB, CKMBINDEX, TROPONINI in the last 168 hours. BNP: BNP (last 3 results) No results for input(s): BNP in the last 8760 hours.  ProBNP (last 3 results) No results for  input(s): PROBNP in the last 8760 hours.  CBG: Recent Labs  Lab 09/02/20 1128 09/02/20 1715 09/02/20 2144 09/03/20 3382  09/03/20 1150  GLUCAP 240* 154* 191* 332* 315*       Signed:  Fayrene Helper MD.  [facility] 09/03/2020, 1:53 PM

## 2023-08-18 ENCOUNTER — Emergency Department (HOSPITAL_BASED_OUTPATIENT_CLINIC_OR_DEPARTMENT_OTHER)
Admission: EM | Admit: 2023-08-18 | Discharge: 2023-08-18 | Disposition: A | Discharge status: Home / Self Care | Payer: BC Managed Care – PPO | Attending: Emergency Medicine | Admitting: Emergency Medicine

## 2023-08-18 ENCOUNTER — Emergency Department (HOSPITAL_BASED_OUTPATIENT_CLINIC_OR_DEPARTMENT_OTHER): Payer: BC Managed Care – PPO

## 2023-08-18 ENCOUNTER — Encounter (HOSPITAL_BASED_OUTPATIENT_CLINIC_OR_DEPARTMENT_OTHER): Payer: Self-pay

## 2023-08-18 ENCOUNTER — Other Ambulatory Visit: Payer: Self-pay

## 2023-08-18 DIAGNOSIS — S92191A Other fracture of right talus, initial encounter for closed fracture: Secondary | ICD-10-CM | POA: Diagnosis not present

## 2023-08-18 DIAGNOSIS — R55 Syncope and collapse: Secondary | ICD-10-CM | POA: Diagnosis not present

## 2023-08-18 DIAGNOSIS — Z794 Long term (current) use of insulin: Secondary | ICD-10-CM | POA: Diagnosis not present

## 2023-08-18 DIAGNOSIS — Y92039 Unspecified place in apartment as the place of occurrence of the external cause: Secondary | ICD-10-CM | POA: Diagnosis not present

## 2023-08-18 DIAGNOSIS — W108XXA Fall (on) (from) other stairs and steps, initial encounter: Secondary | ICD-10-CM | POA: Diagnosis not present

## 2023-08-18 DIAGNOSIS — S99921A Unspecified injury of right foot, initial encounter: Secondary | ICD-10-CM | POA: Diagnosis present

## 2023-08-18 DIAGNOSIS — S92214A Nondisplaced fracture of cuboid bone of right foot, initial encounter for closed fracture: Secondary | ICD-10-CM | POA: Insufficient documentation

## 2023-08-18 DIAGNOSIS — S92101A Unspecified fracture of right talus, initial encounter for closed fracture: Secondary | ICD-10-CM

## 2023-08-18 LAB — CBG MONITORING, ED: Glucose-Capillary: 358 mg/dL — ABNORMAL HIGH (ref 70–99)

## 2023-08-18 MED ORDER — HYDROCODONE-ACETAMINOPHEN 5-325 MG PO TABS
2.0000 | ORAL_TABLET | ORAL | 0 refills | Status: AC | PRN
Start: 2023-08-18 — End: ?

## 2023-08-18 MED ORDER — HYDROCODONE-ACETAMINOPHEN 5-325 MG PO TABS
1.0000 | ORAL_TABLET | Freq: Once | ORAL | Status: AC
  Administered 2023-08-18: 1 via ORAL
  Filled 2023-08-18: qty 1

## 2023-08-18 NOTE — ED Provider Notes (Signed)
[place] [facility] AT [facility] [place] Provider Note   CSN: [id number] Arrival date & time: 08/18/23  1930     History {Add pertinent medical, surgical, social history, OB history to HPI:1} Chief Complaint  Patient presents with   Ankle Pain    [name]. is a 47 y.o. male.   Ankle Pain      Home Medications Prior to Admission medications   Medication Sig Start Date End Date Taking? Authorizing Provider  blood glucose meter kit and supplies KIT Dispense based on patient and insurance preference. Use up to four times daily as directed. (FOR ICD-9 250.00, 250.01). 05/05/19   Albertine Grates, MD  buprenorphine-naloxone (SUBOXONE) 8-2 mg SUBL SL tablet Place 1 tablet under the tongue in the morning, at noon, and at bedtime.     [provider]  insulin lispro (HUMALOG KWIKPEN) 100 UNIT/ML KwikPen Inject 8 Units into the skin 3 (three) times daily with meals. Hold if not eating or eating less than 50% of meal 09/03/20   Zigmund Daniel., MD  insulin lispro (HUMALOG) 100 UNIT/ML KwikPen Inject 0-15 Units into the skin 3 (three) times daily with meals. Per sliding scale in your discharge summary. 09/03/20   Zigmund Daniel., MD  traZODone (DESYREL) 100 MG tablet Take 100 mg by mouth at bedtime. 07/03/20   [provider]  albuterol (PROVENTIL) 90 MCG/ACT inhaler Inhale 2 puffs into the lungs every 6 (six) hours as needed for wheezing. 07/22/11 02/04/12  Sandford Craze, NP      Allergies    Tramadol and Ciprofloxacin    Review of Systems   Review of Systems  Physical Exam Updated Vital Signs BP (!) 145/78 (BP Location: Right Arm)   Pulse 80   Temp 98.2 F (36.8 C) (Oral)   Resp 15   Ht 6\' 1"  (1.854 m)   Wt 81.6 kg   SpO2 98%   BMI 23.75 kg/m  Physical Exam  ED Results / Procedures / Treatments   Labs (all labs ordered are listed, but only abnormal results are displayed) Labs Reviewed  CBG MONITORING, ED - Abnormal;  Notable for the following components:      Result Value   Glucose-Capillary 358 (*)    All other components within normal limits    EKG None  Radiology DG Ankle Complete Right  Result Date: 08/18/2023 CLINICAL DATA:  Swelling and bruising.  Recent fall. EXAM: RIGHT FOOT COMPLETE - 3+ VIEW; RIGHT ANKLE - COMPLETE 3+ VIEW COMPARISON:  Remote ankle radiograph 11/27/2006 FINDINGS: Ankle: Small ossific density arising from the dorsal talus suspicious for avulsion type fracture, age indeterminate. No other fracture of the ankle. The ankle mortise is preserved. Chronic ossification adjacent to the lateral aspect of the distal tibia, unchanged from remote exam. Small Achilles tendon enthesophyte. Mild soft tissue edema. Foot: Irregularity about the lateral aspect of the cuboid may represent a nondisplaced fracture. No additional fracture of the foot. Mild soft tissue edema. IMPRESSION: 1. Small ossific density arising from the dorsal talus suspicious for avulsion type fracture, age indeterminate. 2. Irregularity about the lateral aspect of the cuboid may represent a nondisplaced fracture. Electronically Signed   By: Narda Rutherford M.D.   On: 08/18/2023 20:41   DG Foot Complete Right  Result Date: 08/18/2023 CLINICAL DATA:  Swelling and bruising.  Recent fall. EXAM: RIGHT FOOT COMPLETE - 3+ VIEW; RIGHT ANKLE - COMPLETE 3+ VIEW COMPARISON:  Remote ankle radiograph 11/27/2006 FINDINGS: Ankle: Small  ossific density arising from the dorsal talus suspicious for avulsion type fracture, age indeterminate. No other fracture of the ankle. The ankle mortise is preserved. Chronic ossification adjacent to the lateral aspect of the distal tibia, unchanged from remote exam. Small Achilles tendon enthesophyte. Mild soft tissue edema. Foot: Irregularity about the lateral aspect of the cuboid may represent a nondisplaced fracture. No additional fracture of the foot. Mild soft tissue edema. IMPRESSION: 1. Small ossific  density arising from the dorsal talus suspicious for avulsion type fracture, age indeterminate. 2. Irregularity about the lateral aspect of the cuboid may represent a nondisplaced fracture. Electronically Signed   By: Narda Rutherford M.D.   On: 08/18/2023 20:41    Procedures Procedures  {Document cardiac monitor, telemetry assessment procedure when appropriate:1}  Medications Ordered in ED Medications - No data to display  ED Course/ Medical Decision Making/ A&P   {   Click here for ABCD2, HEART and other calculatorsREFRESH Note before signing :1}                              Medical Decision Making Amount and/or Complexity of Data Reviewed Radiology: ordered.   ***  {Document critical care time when appropriate:1} {Document review of labs and clinical decision tools ie heart score, Chads2Vasc2 etc:1}  {Document your independent review of radiology images, and any outside records:1} {Document your discussion with family members, caretakers, and with consultants:1} {Document social determinants of health affecting pt's care:1} {Document your decision making why or why not admission, treatments were needed:1} Final Clinical Impression(s) / ED Diagnoses Final diagnoses:  None    Rx / DC Orders ED Discharge Orders     None

## 2023-08-18 NOTE — Discharge Instructions (Addendum)
IMPRESSION:  1. Small ossific density arising from the dorsal talus suspicious  for avulsion type fracture, age indeterminate.  2. Irregularity about the lateral aspect of the cuboid may represent  a nondisplaced fracture.

## 2023-08-18 NOTE — ED Triage Notes (Signed)
Patient presents with swelling and bruising to the right ankle and foot. Patient states on Sunday he fell while going up the steps of his apartment. He states his blood sugar had become low and he "blacked out". Since Sunday he has been walking on the right foot and able to bare some weight. Patient also presents with scratches to the right side of his face and a bruising around the left eye. He reports it is also from the fall, but it not concerned about his eye.
